# Patient Record
Sex: Male | Born: 1960 | Race: White | Hispanic: No | Marital: Married | Smoking: Former smoker
Health system: Southern US, Community
[De-identification: ages and names within clinical notes are randomized; demographics above are authoritative.]

## PROBLEM LIST (undated history)

## (undated) DIAGNOSIS — E119 Type 2 diabetes mellitus without complications: Secondary | ICD-10-CM

## (undated) DIAGNOSIS — R569 Unspecified convulsions: Secondary | ICD-10-CM

## (undated) DIAGNOSIS — A048 Other specified bacterial intestinal infections: Secondary | ICD-10-CM

## (undated) DIAGNOSIS — I639 Cerebral infarction, unspecified: Secondary | ICD-10-CM

## (undated) DIAGNOSIS — E782 Mixed hyperlipidemia: Secondary | ICD-10-CM

## (undated) DIAGNOSIS — R519 Headache, unspecified: Secondary | ICD-10-CM

## (undated) DIAGNOSIS — K629 Disease of anus and rectum, unspecified: Secondary | ICD-10-CM

## (undated) DIAGNOSIS — K449 Diaphragmatic hernia without obstruction or gangrene: Secondary | ICD-10-CM

## (undated) DIAGNOSIS — I219 Acute myocardial infarction, unspecified: Secondary | ICD-10-CM

## (undated) DIAGNOSIS — I2510 Atherosclerotic heart disease of native coronary artery without angina pectoris: Secondary | ICD-10-CM

## (undated) DIAGNOSIS — G5603 Carpal tunnel syndrome, bilateral upper limbs: Secondary | ICD-10-CM

## (undated) DIAGNOSIS — K219 Gastro-esophageal reflux disease without esophagitis: Secondary | ICD-10-CM

## (undated) DIAGNOSIS — E1142 Type 2 diabetes mellitus with diabetic polyneuropathy: Secondary | ICD-10-CM

## (undated) DIAGNOSIS — K635 Polyp of colon: Secondary | ICD-10-CM

## (undated) HISTORY — DX: Atherosclerotic heart disease of native coronary artery without angina pectoris: I25.10

## (undated) HISTORY — DX: Type 2 diabetes mellitus without complications: E11.9

## (undated) HISTORY — DX: Unspecified convulsions: R56.9

## (undated) HISTORY — PX: OTHER SURGICAL HISTORY: SHX169

## (undated) HISTORY — DX: Acute myocardial infarction, unspecified: I21.9

## (undated) HISTORY — DX: Gastro-esophageal reflux disease without esophagitis: K21.9

## (undated) HISTORY — DX: Polyp of colon: K63.5

## (undated) HISTORY — DX: Mixed hyperlipidemia: E78.2

## (undated) HISTORY — DX: Diaphragmatic hernia without obstruction or gangrene: K44.9

## (undated) HISTORY — DX: Disease of anus and rectum, unspecified: K62.9

## (undated) HISTORY — DX: Cerebral infarction, unspecified: I63.9

## (undated) HISTORY — DX: Other specified bacterial intestinal infections: A04.8

## (undated) HISTORY — PX: CHOLECYSTECTOMY: SHX55

---
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
## 2005-05-24 ENCOUNTER — Emergency Department (HOSPITAL_COMMUNITY): Admission: EM | Admit: 2005-05-24 | Discharge: 2005-05-25 | Payer: Self-pay | Admitting: Emergency Medicine

## 2005-05-30 ENCOUNTER — Ambulatory Visit: Payer: Self-pay | Admitting: *Deleted

## 2005-06-03 ENCOUNTER — Ambulatory Visit: Payer: Self-pay | Admitting: *Deleted

## 2005-06-03 ENCOUNTER — Encounter (HOSPITAL_COMMUNITY): Admission: RE | Admit: 2005-06-03 | Discharge: 2005-07-03 | Payer: Self-pay | Admitting: *Deleted

## 2005-06-06 ENCOUNTER — Ambulatory Visit: Payer: Self-pay | Admitting: *Deleted

## 2006-01-27 DIAGNOSIS — K635 Polyp of colon: Secondary | ICD-10-CM

## 2006-01-27 HISTORY — DX: Polyp of colon: K63.5

## 2006-02-17 ENCOUNTER — Ambulatory Visit: Payer: Self-pay | Admitting: Cardiovascular Disease

## 2006-07-28 ENCOUNTER — Ambulatory Visit: Payer: Self-pay | Admitting: Cardiovascular Disease

## 2006-08-03 ENCOUNTER — Encounter (HOSPITAL_COMMUNITY): Admission: RE | Admit: 2006-08-03 | Discharge: 2006-09-02 | Payer: Self-pay | Admitting: Cardiovascular Disease

## 2006-08-03 ENCOUNTER — Ambulatory Visit: Payer: Self-pay | Admitting: Internal Medicine

## 2006-09-15 ENCOUNTER — Ambulatory Visit: Payer: Self-pay | Admitting: Internal Medicine

## 2006-09-18 ENCOUNTER — Ambulatory Visit (HOSPITAL_COMMUNITY): Admission: RE | Admit: 2006-09-18 | Discharge: 2006-09-18 | Payer: Self-pay | Admitting: Internal Medicine

## 2006-10-12 ENCOUNTER — Ambulatory Visit: Payer: Self-pay | Admitting: Internal Medicine

## 2006-10-12 ENCOUNTER — Encounter: Payer: Self-pay | Admitting: Internal Medicine

## 2006-10-12 ENCOUNTER — Ambulatory Visit (HOSPITAL_COMMUNITY): Admission: RE | Admit: 2006-10-12 | Discharge: 2006-10-12 | Payer: Self-pay | Admitting: Internal Medicine

## 2006-10-12 HISTORY — PX: COLONOSCOPY: SHX174

## 2006-10-12 HISTORY — PX: ESOPHAGOGASTRODUODENOSCOPY: SHX1529

## 2006-10-23 ENCOUNTER — Emergency Department (HOSPITAL_COMMUNITY): Admission: EM | Admit: 2006-10-23 | Discharge: 2006-10-23 | Payer: Self-pay | Admitting: Emergency Medicine

## 2006-10-26 ENCOUNTER — Ambulatory Visit: Payer: Self-pay | Admitting: Internal Medicine

## 2006-11-18 ENCOUNTER — Ambulatory Visit: Payer: Self-pay | Admitting: Internal Medicine

## 2006-12-04 ENCOUNTER — Ambulatory Visit: Payer: Self-pay | Admitting: Cardiovascular Disease

## 2007-07-01 ENCOUNTER — Ambulatory Visit: Payer: Self-pay | Admitting: Cardiovascular Disease

## 2008-06-27 DIAGNOSIS — I219 Acute myocardial infarction, unspecified: Secondary | ICD-10-CM

## 2008-06-27 HISTORY — DX: Acute myocardial infarction, unspecified: I21.9

## 2008-06-30 ENCOUNTER — Ambulatory Visit: Payer: Self-pay | Admitting: Cardiology

## 2008-06-30 ENCOUNTER — Inpatient Hospital Stay (HOSPITAL_COMMUNITY): Admission: EM | Admit: 2008-06-30 | Discharge: 2008-07-05 | Payer: Self-pay | Admitting: Cardiology

## 2008-07-17 ENCOUNTER — Encounter (HOSPITAL_COMMUNITY): Admission: RE | Admit: 2008-07-17 | Discharge: 2008-08-16 | Payer: Self-pay | Admitting: Oncology

## 2008-07-25 DIAGNOSIS — R079 Chest pain, unspecified: Secondary | ICD-10-CM | POA: Insufficient documentation

## 2008-07-25 DIAGNOSIS — M549 Dorsalgia, unspecified: Secondary | ICD-10-CM | POA: Insufficient documentation

## 2008-07-25 DIAGNOSIS — K219 Gastro-esophageal reflux disease without esophagitis: Secondary | ICD-10-CM | POA: Insufficient documentation

## 2008-07-26 ENCOUNTER — Ambulatory Visit: Payer: Self-pay | Admitting: Cardiology

## 2008-07-26 ENCOUNTER — Encounter: Payer: Self-pay | Admitting: Cardiology

## 2008-07-26 DIAGNOSIS — F17200 Nicotine dependence, unspecified, uncomplicated: Secondary | ICD-10-CM | POA: Insufficient documentation

## 2008-07-26 DIAGNOSIS — I2510 Atherosclerotic heart disease of native coronary artery without angina pectoris: Secondary | ICD-10-CM

## 2008-07-26 DIAGNOSIS — E782 Mixed hyperlipidemia: Secondary | ICD-10-CM

## 2008-08-16 ENCOUNTER — Encounter (HOSPITAL_COMMUNITY): Admission: RE | Admit: 2008-08-16 | Discharge: 2008-09-15 | Payer: Self-pay | Admitting: Cardiology

## 2008-09-07 ENCOUNTER — Emergency Department (HOSPITAL_COMMUNITY): Admission: EM | Admit: 2008-09-07 | Discharge: 2008-09-07 | Payer: Self-pay | Admitting: Emergency Medicine

## 2008-10-12 ENCOUNTER — Encounter (INDEPENDENT_AMBULATORY_CARE_PROVIDER_SITE_OTHER): Payer: Self-pay | Admitting: *Deleted

## 2008-10-12 ENCOUNTER — Encounter: Payer: Self-pay | Admitting: Cardiology

## 2008-10-12 LAB — CONVERTED CEMR LAB
ALT: 27 units/L
AST: 19 units/L
Albumin: 4.4 g/dL
Bilirubin, Direct: 0.1 mg/dL
HDL: 32 mg/dL
LDL Cholesterol: 80 mg/dL
Total Protein: 7 g/dL

## 2008-10-13 ENCOUNTER — Encounter: Payer: Self-pay | Admitting: Cardiology

## 2008-10-13 LAB — CONVERTED CEMR LAB
AST: 19 units/L (ref 0–37)
Albumin: 4.4 g/dL (ref 3.5–5.2)
Alkaline Phosphatase: 83 units/L (ref 39–117)
LDL Cholesterol: 80 mg/dL (ref 0–99)
Total Bilirubin: 0.5 mg/dL (ref 0.3–1.2)
Total Protein: 7 g/dL (ref 6.0–8.3)
Triglycerides: 270 mg/dL — ABNORMAL HIGH (ref <150)

## 2008-10-26 ENCOUNTER — Encounter: Payer: Self-pay | Admitting: Cardiology

## 2008-10-30 ENCOUNTER — Ambulatory Visit: Payer: Self-pay | Admitting: Cardiology

## 2008-11-16 ENCOUNTER — Telehealth (INDEPENDENT_AMBULATORY_CARE_PROVIDER_SITE_OTHER): Payer: Self-pay | Admitting: *Deleted

## 2009-01-29 ENCOUNTER — Encounter: Payer: Self-pay | Admitting: Cardiology

## 2009-02-14 ENCOUNTER — Encounter (INDEPENDENT_AMBULATORY_CARE_PROVIDER_SITE_OTHER): Payer: Self-pay | Admitting: *Deleted

## 2009-02-26 ENCOUNTER — Encounter (INDEPENDENT_AMBULATORY_CARE_PROVIDER_SITE_OTHER): Payer: Self-pay | Admitting: *Deleted

## 2009-03-16 ENCOUNTER — Ambulatory Visit: Payer: Self-pay | Admitting: Adult Health

## 2009-03-16 DIAGNOSIS — M62838 Other muscle spasm: Secondary | ICD-10-CM

## 2009-04-24 ENCOUNTER — Emergency Department (HOSPITAL_COMMUNITY): Admission: EM | Admit: 2009-04-24 | Discharge: 2009-04-24 | Payer: Self-pay | Admitting: Emergency Medicine

## 2009-04-24 ENCOUNTER — Encounter (HOSPITAL_COMMUNITY): Admission: RE | Admit: 2009-04-24 | Discharge: 2009-05-24 | Payer: Self-pay | Admitting: Family Medicine

## 2009-04-25 ENCOUNTER — Telehealth (INDEPENDENT_AMBULATORY_CARE_PROVIDER_SITE_OTHER): Payer: Self-pay

## 2009-04-26 ENCOUNTER — Telehealth (INDEPENDENT_AMBULATORY_CARE_PROVIDER_SITE_OTHER): Payer: Self-pay

## 2009-04-26 ENCOUNTER — Encounter: Payer: Self-pay | Admitting: Cardiology

## 2009-04-27 ENCOUNTER — Encounter: Payer: Self-pay | Admitting: Cardiology

## 2009-04-30 ENCOUNTER — Ambulatory Visit (HOSPITAL_COMMUNITY): Admission: RE | Admit: 2009-04-30 | Discharge: 2009-04-30 | Payer: Self-pay | Admitting: General Surgery

## 2009-05-24 ENCOUNTER — Ambulatory Visit: Payer: Self-pay | Admitting: Cardiology

## 2009-05-24 ENCOUNTER — Emergency Department (HOSPITAL_COMMUNITY): Admission: EM | Admit: 2009-05-24 | Discharge: 2009-05-24 | Payer: Self-pay | Admitting: Emergency Medicine

## 2009-05-25 ENCOUNTER — Encounter: Payer: Self-pay | Admitting: Cardiology

## 2009-06-07 ENCOUNTER — Ambulatory Visit: Payer: Self-pay | Admitting: Internal Medicine

## 2009-06-07 DIAGNOSIS — R1012 Left upper quadrant pain: Secondary | ICD-10-CM

## 2009-06-11 ENCOUNTER — Encounter: Payer: Self-pay | Admitting: Gastroenterology

## 2009-06-11 ENCOUNTER — Encounter: Payer: Self-pay | Admitting: Internal Medicine

## 2009-06-12 ENCOUNTER — Ambulatory Visit (HOSPITAL_COMMUNITY): Admission: RE | Admit: 2009-06-12 | Discharge: 2009-06-12 | Payer: Self-pay | Admitting: Internal Medicine

## 2009-06-20 ENCOUNTER — Ambulatory Visit: Payer: Self-pay | Admitting: Cardiology

## 2009-09-27 DIAGNOSIS — A048 Other specified bacterial intestinal infections: Secondary | ICD-10-CM

## 2009-09-27 HISTORY — DX: Other specified bacterial intestinal infections: A04.8

## 2009-10-12 ENCOUNTER — Ambulatory Visit: Payer: Self-pay | Admitting: Internal Medicine

## 2009-10-17 ENCOUNTER — Encounter: Payer: Self-pay | Admitting: Internal Medicine

## 2009-10-26 ENCOUNTER — Encounter (INDEPENDENT_AMBULATORY_CARE_PROVIDER_SITE_OTHER): Payer: Self-pay

## 2009-10-26 ENCOUNTER — Encounter: Payer: Self-pay | Admitting: Cardiology

## 2009-10-26 LAB — CONVERTED CEMR LAB
ALT: 42 units/L
AST: 29 units/L
Albumin: 4.3 g/dL
Alkaline Phosphatase: 99 units/L
HDL: 25 mg/dL
Total Protein: 6.8 g/dL

## 2009-10-28 LAB — CONVERTED CEMR LAB
ALT: 42 units/L (ref 0–53)
AST: 29 units/L (ref 0–37)
Bilirubin, Direct: 0.1 mg/dL (ref 0.0–0.3)
Indirect Bilirubin: 0.5 mg/dL (ref 0.0–0.9)
Total Bilirubin: 0.6 mg/dL (ref 0.3–1.2)
Total Protein: 6.8 g/dL (ref 6.0–8.3)
Triglycerides: 419 mg/dL — ABNORMAL HIGH (ref <150)

## 2009-10-30 ENCOUNTER — Ambulatory Visit: Payer: Self-pay | Admitting: Cardiology

## 2010-02-11 LAB — CONVERTED CEMR LAB
ALT: 26 units/L (ref 0–53)
AST: 17 units/L (ref 0–37)
Alkaline Phosphatase: 97 units/L (ref 39–117)
Cholesterol: 284 mg/dL — ABNORMAL HIGH (ref 0–200)
Total Protein: 7.2 g/dL (ref 6.0–8.3)
Triglycerides: 549 mg/dL — ABNORMAL HIGH (ref <150)

## 2010-02-12 ENCOUNTER — Encounter (HOSPITAL_COMMUNITY)
Admission: RE | Admit: 2010-02-12 | Discharge: 2010-02-26 | Payer: Self-pay | Source: Home / Self Care | Attending: Pulmonary Disease | Admitting: Pulmonary Disease

## 2010-02-26 NOTE — Letter (Signed)
Summary: [facility], [facility] at [employer]  [address] [place] [address]   Phone: [phone]  Fax: [phone]     October 30, 2009 MRN: [id number]   Nicholas [name] [address] [place], [place]  [address]      YOUR LAB WORK IS DUE  January 30, 2010 _________________________________________  Please go to [facility], located across the street from [facility] on the second floor.  Hours are Monday - Friday 7am until 7:30pm         Saturday 8am until 12noon    _X_  DO NOT EAT OR DRINK AFTER MIDNIGHT EVENING PRIOR TO LABWORK  __ YOUR LABWORK IS NOT FASTING --YOU MAY EAT PRIOR TO LABWORK

## 2010-02-26 NOTE — Letter (Signed)
Summary: LABS FROM [facility]  LABS FROM [facility]   Imported By: Rexene Alberts 10/17/2009 16:33:25  _____________________________________________________________________  External Attachment:    Type:   Image     Comment:   External Document

## 2010-02-26 NOTE — Letter (Signed)
Summary: External Other  External Other   Imported By: Peggyann Shoals 06/11/2009 11:14:28  _____________________________________________________________________  External Attachment:    Type:   Image     Comment:   External Document

## 2010-02-26 NOTE — Assessment & Plan Note (Signed)
Summary: ABD PAIN/SS   Visit Type:  Initial Visit Primary Care Provider:  golding  Chief Complaint:  epigastric pain and recently treated for hpylori.  History of Present Illness: Followup long-standing atypical chest and abdominal pain with negative extensive workup including EGD in 07/03/2006. He's tried Lidoderm patches in the past without any appreciable improvement in his symptoms. He saw Dr.Golding recently who gave him a course of Helidac therapy for positive H. pylori serologies. He tells me he started having normal bowel movements with antibiotic therapy (completed one week ago). Between normalization of bowel function and anti-H. pyloric therapy his abdominal pain has improved considerably. He continues taking protonix and Plavix. It is notably also had a negative colonoscopy back in 07-03-06. He's also had negative CT scan and cardiac evaluation.  As far as bowel function is concerned. He's gone from having one bowel movement once to twice weekly to one to 2 bowel movements every day since antibiotic therapy was taken. He states he really feels better after he is has good, regular bowel movements. He did not keep his June 2011 appointment with [place].  Current Medications (verified): 1)  Protonix 40 Mg Solr (Pantoprazole Sodium) .Marland Kitchen.. 1 Tab Once Daily 2)  Plavix 75 Mg Tabs (Clopidogrel Bisulfate) .Marland Kitchen.. 1 Tab Once Daily 3)  Aspirin Ec 325 Mg Tbec (Aspirin) .... Take One Tablet By Mouth Daily 4)  Metoprolol Tartrate 25 Mg Tabs (Metoprolol Tartrate) .... Take 1 Tablet By Mouth Once A Day  Allergies (verified): No Known Drug Allergies  Past History:  Family History: Last updated: 2009-07-02 Father: deceased age 9 due to myocardial infarction Mother: deceased age 62 due to renal failure Brother, PUD Brother, deceased, cancer started in "knee"  3 brother, 1 with stents  3 sister, alive and well No FH of CRC, liver disease.  Social History: Last updated: 02-Jul-2009 Full Time,  self-employed Married  Tobacco Use - Yes, 1ppd Alcohol Use - no Regular Exercise - no Drug Use - no pt has 2 children alive and well      Risk Factors: Alcohol Use: 0 (03/16/2009) Exercise: no (07/25/2008)  Risk Factors: Smoking Status: current (03/16/2009)  Past Medical History: CAD - DES to PLB, BMS to mid and distal RCA, DES to circumflex and distal LAD, 6/10 Diabetes Type 2 Hyperlipidemia Myocardial Infarction G E R D TCS 9/08-->Hyperplastic rectosigmoid polyp EGD 9/08-->small hiatal hernia, bulbar erosions hpylori  Past Surgical History: Collapsed lung 15 yrs ago, chest tube Wire in apex of right lung, since childhood, found in there incidentally gallbladder  Vital Signs:  Patient profile:   50 year old male Height:      68 inches Weight:      182 pounds BMI:     27.77 Temp:     98.2 degrees F oral Pulse rate:   92 / minute BP sitting:   112 / 82  (left arm) Cuff size:   regular  Vitals Entered By: Hendricks Limes LPN (October 12, 2009 2:20 PM)  Impression & Recommendations: Impression: Relatively long-standing atypical left upper quadrant abdominal and left chest pain now improved at least temporarily with a course of therapy for positive H. pylori serologies and improvement  in bowel function.  All along, we have not found any significant organic pathology to account for his chest and abdominal pain. EGD back in 03-Jul-2006 essentially negative. Am doubtful an occult H. pylori infection explains his chest and abdominal pain satisfactorily. However, wholeheartedly agree with the treatment given there was some objective evidence  of infection. In retrospect, I wonder if his symptoms have not been more related to constipation rather than anything else along the way. At any rate, I'm glad to see the patient is doing much better now. The likelihood of any significant occult underlying pathology lurking  under the surface is pretty low at this time.  Recommendations: Continue  Protonix 40 mg orally daily particularly while on Plavix. A bowel regimen consisting primarily with MiraLax. I've instructed Mr. Whiteford to take 17 g orally at bedtime as needed if no bowel movement on any given day to facilitate a bowel movement daily to every other day (if recent improvement in bowel function is related to a course of antibiotic therapy alone, improvement in bowel will likely only be short-lived and therefore laxative supplement will likely be needed).  I plan to see this nice gentleman back in the office in 6 weeks to reassess.  Appended Document: Orders Update    Clinical Lists Changes  Orders: Added new Service order of Est. Patient Level IV (04540) - Signed

## 2010-02-26 NOTE — Letter (Signed)
Summary: Office Note from Dr.Jenkins re:Lap Choley  Office Note from Dr.Jenkins re:Lap Choley   Imported By: Raechel Ache [facility] 04/26/2009 13:47:55  _____________________________________________________________________  External Attachment:    Type:   Image     Comment:   External Document

## 2010-02-26 NOTE — Assessment & Plan Note (Signed)
Summary: nurse visit  Nurse Visit   Vital Signs:  Patient profile:   50 year old male Weight:      199 pounds BMI:     30.37 O2 Sat:      97 % on Room air Pulse rate:   87 / minute BP sitting:   121 / 84  (left arm)  Vitals Entered By: Teressa Lower RN (May 24, 2009 9:24 AM)  O2 Flow:  Room air  Serial Vital Signs/Assessments:  Time      Position  BP       Pulse  Resp  Temp     By 9:43 AM   Sitting   121/80   80                    Nahun Kronberg RN   Current Medications (verified): 1)  Protonix 40 Mg Solr (Pantoprazole Sodium) .Marland Kitchen.. 1 Tab Once Daily 2)  Plavix 75 Mg Tabs (Clopidogrel Bisulfate) .Marland Kitchen.. 1 Tab Once Daily 3)  Glipizide 5 Mg Xr24h-Tab (Glipizide) .Marland Kitchen.. 1 Tab Once Daily 4)  Actos 30 Mg Tabs (Pioglitazone Hcl) .Marland Kitchen.. 1 Tab Once Daily 5)  Aspirin Ec 325 Mg Tbec (Aspirin) .... Take One Tablet By Mouth Daily 6)  Metoprolol Tartrate 25 Mg Tabs (Metoprolol Tartrate) .... Take 1 Tablet By Mouth Once A Day  Allergies (verified): No Known Drug Allergies  Primary Provider:  Assunta Found, MD   History of Present Illness: S:pt walked into office c/o numbness in left chest and side B: pt had cholecystectomy 3 weeks ago A: ekg- bbb, vss, pt has ocass numbness in hands also, completely numb in left chest and side, this  has been like this for about 2 yrs with numerous tests and procedures with no relief R I suggested pt go to the ED for eval, he refused.: pt to see Dr. Jena Gauss  per Dr. Phillips Odor and I will call Dr. Beatrice Lecher  nurse for a neurology referrall, A:  pt called back to the office with worsening cp and left arm pain, sent pt to ED, called  to ED gave report and faxed EKg done this am   Patient Instructions: 1)  Your physician recommends that you continue on your current medications as directed. Please refer to the Current Medication list given to you today. 2)  I will speak with Dr. Beatrice Lecher nurse today about a neurology referral

## 2010-02-26 NOTE — Progress Notes (Signed)
Summary: Surg. Clearance   Phone Note From Other Clinic Call back at [phone]   Caller: Jan (Dr.Jenkins' office) Summary of Call: needs surg. clearance for Lap Choley/tg Initial call taken by: Raechel Ache [facility],  April 26, 2009 11:18 AM  Follow-up for Phone Call        Jan, from Dr. York Ram office was asked to send [place] a  copy of Mr. Wickard's office visit (scanned into chart) concerning surgery. They have advised pt. to stop Plavix 3 days prior to surgery. Is this ok with you? Pt. states surgery is scheduled for this coming Monday 04-30-09. He was last seen here by Gladis Riffle, NP on 03-16-09. Follow-up by: Larita Fife Via LPN,  April 26, 2009 1:20 PM  Additional Follow-up for Phone Call Additional follow up Details #1::        I reviewed the surgical visit note and last office note by [name].  Patient less than one year out from DES (June 2010), but has had greater than 6 months therapy, and stent thrombosis risk while present should still be relatively small.  If cholecystectomy is felt to be needed soon, would suggest that Plavix not be held greater than 7 days total if possible.  He should be able to proceed with surgery as long as cardiac status has been stable.  Can be seen in-house as consult if needed. Additional Follow-up by: Loreli Slot, MD, [facility],  April 27, 2009 11:56 AM    Additional Follow-up for Phone Call Additional follow up Details #2::    Clearance letter faxed to Dr. Juanetta Gosling office.

## 2010-02-26 NOTE — Letter (Signed)
Summary: [facility], [facility] at [employer]  [address] [place] [address]   Phone: [phone]  Fax: [phone]     Jun 20, 2009 MRN: [id number]   Nicholas [name] [address] [place], [place]  [address]      YOUR LAB WORK IS DUE  December 03, 2009 _________________________________________  Please go to [facility], located across the street from [facility] on the second floor.  Hours are Monday - Friday 7am until 7:30pm         Saturday 8am until 12noon    _X_  DO NOT EAT OR DRINK AFTER MIDNIGHT EVENING PRIOR TO LABWORK  __ YOUR LABWORK IS NOT FASTING --YOU MAY EAT PRIOR TO LABWORK

## 2010-02-26 NOTE — Letter (Signed)
Summary: Clearance Letter  [facility]  [address] [place] [address]   Phone: [phone]  Fax: [phone]    April 27, 2009  Re:     Nicholas [name] Address:   [address]     [place], [place]  [address] DOB:     03-18-1960 MRN:     [id number]   Dear Dr. Lovell Sheehan,   Patient less than one year out from DES (June 2010), but has had greater than 6 months therapy, and stent thrombosis risk while present should still be relatively small.  If cholecystectomy is felt to be needed soon, would suggest that Plavix not be held greater than 7 days total if possible.  He should be able to proceed with surgery as long as cardiac status has been stable.  Can be seen in-house as consult if needed.    Sincerely,  Diona Browner, MD, [facility], [place]    Sincerely,  Larita Fife Via LPN

## 2010-02-26 NOTE — Assessment & Plan Note (Signed)
Summary: SEVERE LEFT SIDE PAIN/SS   Visit Type:  Initial Consult Referring Provider:  McGough Primary Care Provider:  Phillips Odor  Chief Complaint:  left side pain.  History of Present Illness: [name] is a pleasant 50 y/o WM, patient of Dr. Phillips Odor, who was referred for further evaluation of chronic left chest/left upper quadrant abd pain. He has had these symptoms for 2-3 years. We initially saw him in 8/08 for similar symptoms. At that time, he had chest CT which showed wire in apex of right lung (there since childhood per pt) and minimal interstitial lung disease. Labs including LFTs, amylase, lipase all normal. CT A/P showed fatty liver. TCS/EGD 9/08 showed small hh, bulbar erosions, rs hyperplastic polyp. Dr. Jena Gauss felt his symptoms were coming from chest wall.    Since then he has had CAD with 5 stents in 6/10 when he presented with non-ST-elevation MI. He states this helped his back pain but did not affect chest/luq pain. He had recent chest CT which showed probable increased basilar atelectasis status post recent cholecystectomy. No other acute chest findings. Nonspecific small fluid and air collection in the cholecystectomy bed status post apparent recent cholecystectomy.Hepatic steatosis.  Gallbladder removed for biliary dyskinesia on 4/4/11did not affect luq pain/chest pain.  He describes pain as numb and hurting all at same time. Starts at xiphoid process and follows left lower ribcage margin and goes into flank. Sometimes just under left ribs. No associated n/v. Not affected by meals or BMs. Has BM q2-3 days, soft stool, since gb out. Prior to that had stool every other day. No melena, brbpr. Some days has no pain. Days with pain, the pain is constant. No associated SOB. No cough/congestion. Apply pressure helps pain. No better with flexeril or levsin. Not worse with exertion. No back pain since stents placed.           Current Medications (verified): 1)  Protonix 40 Mg Solr  (Pantoprazole Sodium) .Marland Kitchen.. 1 Tab Once Daily 2)  Plavix 75 Mg Tabs (Clopidogrel Bisulfate) .Marland Kitchen.. 1 Tab Once Daily 3)  Actos 30 Mg Tabs (Pioglitazone Hcl) .Marland Kitchen.. 1 Tab Once Daily 4)  Aspirin Ec 325 Mg Tbec (Aspirin) .... Take One Tablet By Mouth Daily 5)  Metoprolol Tartrate 25 Mg Tabs (Metoprolol Tartrate) .... Take 1 Tablet By Mouth Once A Day 6)  Glimepiride 2 Mg Tabs (Glimepiride) .... 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  Social History: Full Time, self-employed Married  Tobacco Use - Yes, 1ppd Alcohol Use - no Regular Exercise - no Drug Use - no pt has 2 children alive and well      Review of Systems General:  Denies fever, chills, sweats, anorexia, fatigue, weakness, and weight loss. Eyes:  Denies vision loss. ENT:  Denies nasal congestion, nosebleeds, sore throat, hoarseness, and difficulty swallowing.  CV:  Complains of chest pains; denies angina, palpitations, dyspnea on exertion, and peripheral edema. Resp:  Denies dyspnea at rest, dyspnea with exercise, cough, sputum, and wheezing. GI:  See HPI; Denies difficulty swallowing. GU:  Denies urinary burning and blood in urine. MS:  Denies joint pain / LOM. Derm:  Denies rash and itching. Neuro:  Denies weakness, frequent headaches,  memory loss, and confusion. Psych:  Denies depression and anxiety. Endo:  Denies unusual weight change. Heme:  Denies bruising and bleeding. Allergy:  Denies hives and rash.  Vital Signs:  Patient profile:   50 year old male Height:      68 inches Weight:      189 pounds BMI:     28.84 Temp:     97.7 degrees F oral Pulse rate:   80 / minute BP sitting:   118 / 80  (left arm) Cuff size:   regular  Vitals Entered By: Cloria Spring LPN (Jun 07, 2009 1:32 PM)  Physical Exam  General:  Well developed, well nourished, no acute distress. Head:  Normocephalic and atraumatic. Eyes:  Conjunctivae pink, no scleral icterus.  Mouth:  Oropharyngeal mucosa moist, pink.  No lesions, erythema or exudate.    Neck:  Supple; no masses or thyromegaly. Lungs:  Clear throughout to auscultation. Heart:  Regular rate and rhythm; no murmurs, rubs,  or bruits. Abdomen:  Bowel sounds normal.  Abdomen is soft, nontender, nondistended.  No rebound or guarding.  No hepatosplenomegaly, masses or hernias.  No abdominal bruits. Could not ilicit pain. Extremities:  No clubbing, cyanosis, edema or deformities noted. Neurologic:  Alert and  oriented x4;  grossly normal neurologically. Skin:  Intact without significant lesions or rashes. Cervical Nodes:  No significant cervical adenopathy. Psych:  Alert and cooperative. Normal mood and affect.  Impression & Recommendations:  Problem # 1:  LUQ PAIN (ICD-789.02)  Left sided chest pain/LUQ pain. Really seems to be more associated with ribcage/chest wall. Not associated with meals, BMs. He had extensive w/u in 2008 by [place] with no significant findings. Since has tried antispasmotics. Cholecystectomy and coronary artery stents have not helped. Sometimes atypcial reflux can present this way, but notably he has good control of typical symptoms on pantoprazole. I discussed this patient with Dr. Jena Gauss. He recommends one last CT A/P with IV/oral contrast. If unremarkable, then  consider Lidoderm patch, off label.   Orders: Consultation Level IV (91478)  Problem # 2:  TOBACCO ABUSE (ICD-305.1) Encourage smoking cessation. He is not interested at this time, stating he has failed multiple times. Orders: Consultation Level IV (29562)  Appended Document: SEVERE LEFT SIDE PAIN/SS Please arrange for CT A/P with IV/oral contrast. Recommended by Dr. Jena Gauss. Please let patient know.   Appended Document: SEVERE LEFT SIDE PAIN/SS Pt aware CT to be scheduled.  Appended Document: SEVERE LEFT SIDE PAIN/SS Pt scheduled for CT Scan 06/12/09@4 :00p.m.

## 2010-02-26 NOTE — Miscellaneous (Signed)
Summary: LABS LIPIDS,LIVER,10/12/2008  Clinical Lists Changes  Observations: Added new observation of ALBUMIN: 4.4 g/dL (04/54/0981 19:14) Added new observation of PROTEIN, TOT: 7.0 g/dL (78/29/5621 30:86) Added new observation of SGPT (ALT): 27 units/L (10/12/2008 15:01) Added new observation of SGOT (AST): 19 units/L (10/12/2008 15:01) Added new observation of ALK PHOS: 83 units/L (10/12/2008 15:01) Added new observation of BILI DIRECT: 0.1 mg/dL (57/84/6962 95:28) Added new observation of LDL: 80 mg/dL (41/32/4401 02:72) Added new observation of HDL: 32 mg/dL (53/66/4403 47:42) Added new observation of TRIGLYC TOT: 270 mg/dL (59/56/3875 64:33) Added new observation of CHOLESTEROL: 166 mg/dL (29/51/8841 66:06)

## 2010-02-26 NOTE — Letter (Signed)
Summary: CT SCAN ORDER  CT SCAN ORDER   Imported By: Ave Filter 06/11/2009 12:40:10  _____________________________________________________________________  External Attachment:    Type:   Image     Comment:   External Document

## 2010-02-26 NOTE — Assessment & Plan Note (Signed)
Summary: rov  Medications Added FLEXERIL 10 MG TABS (CYCLOBENZAPRINE HCL) take 1 tablet by mouth three times a day as needed for muscle spasm      Allergies Added: NKDA  Visit Type:  Follow-up Primary Provider:  Assunta Found, MD  CC:  NO COMPLAINTS.  History of Present Illness: Nicholas Nicholas [name] is a pleasant 50 y/o CM with known history of CAD, s/p DES to PLB, BMS to mid and distal RCA, DES to circumflex and distal LAD in June of 2010. This was completed as a result of complaints of chest pressure on the left side.  Since having the PCI he has not felt any better symptomatically. He continues to have pressure in his chest and substernal area, with and without exertion. Sometimes becoming more intense and then lessening on its own.  He has not ever taken Ntg for relief.  He has had GB ultrasound, colonoscopy, EGD all of which were WNL per patient.,  He continues to smoke, but is down to 4 cigarrettes a day. He has been medically compliant.  He states that it seems that something is not being found, as he has not had improvement in his symptoms since cardiac intervention.  Dr. Phillips Odor suggested muscle relaxers in the past, but he did not want to use this until other work-ups were completed.  Preventive Screening-Counseling & Management  Alcohol-Tobacco     Alcohol drinks/day: 0     Smoking Status: current     Smoke Cessation Stage: ready  Current Medications (verified): 1)  Protonix 40 Mg Solr (Pantoprazole Sodium) .Marland Kitchen.. 1 Tab Once Daily 2)  Plavix 75 Mg Tabs (Clopidogrel Bisulfate) .Marland Kitchen.. 1 Tab Once Daily 3)  Glipizide 5 Mg Xr24h-Tab (Glipizide) .Marland Kitchen.. 1 Tab Once Daily 4)  Actos 30 Mg Tabs (Pioglitazone Hcl) .Marland Kitchen.. 1 Tab Once Daily 5)  Aspirin 81 Mg Tbec (Aspirin) .Marland Kitchen.. 1 Tab Once Daily 6)  Metoprolol Tartrate 25 Mg Tabs (Metoprolol Tartrate) .Marland Kitchen.. 1 Tab Two Times A Day 7)  Flexeril 10 Mg Tabs (Cyclobenzaprine Hcl) .... Take 1 Tablet By Mouth Three Times A Day As Needed For Muscle Spasm  Allergies  (verified): No Known Drug Allergies PMH-FH-SH reviewed-no changes except otherwise noted  Social History: Alcohol drinks/day:  0  Review of Systems       Chest pressure. All other systems have been reviewed and are negative unless stated above.   Vital Signs:  Patient profile:   50 year old Nicholas [name] Weight:      199 pounds Pulse rate:   81 / minute BP sitting:   119 / 76  (right arm)  Vitals Entered By: Dreama Saa, CNA (March 16, 2009 2:53 PM)  Physical Exam  General:  Well developed, well nourished, in no acute distress. Lungs:  Clear bilaterally to auscultation and percussion. Heart:  Non-displaced PMI, chest non-tender; regular rate and rhythm, S1, S2 without murmurs, rubs or gallops. Carotid upstroke normal, no bruit. Normal abdominal aortic size, no bruits. Femorals normal pulses, no bruits. Pedals normal pulses. No edema, no varicosities. Abdomen:  Negative murphy's sign. Msk:  Pain is reproducible with palpation of L chest and substernal area. Extremities:  No clubbing or cyanosis. Neurologic:  Alert and oriented x 3. Psych:  Normal affect.   EKG  Procedure date:  03/16/2009  Findings:      Normal sinus rhythm with rate of: 78bpm  Right bundle branch block.    Impression & Recommendations:  Problem # 1:  MUSCLE SPASM (ICD-728.85) This does not appear to  be cardiac in origin as he symptoms did not subside after PCI.  The pain is constant with increases of pain waxing and waneing.  Will give Rx for flexeril without refills.  If this helps, he will follow-up with Dr. Phillips Odor for further evalution and refils at his discretion.    Problem # 2:  CHEST PAIN (ICD-786.50) Other etioogy could be biliary dysfunction. HIDA scan may be considered if musculoskeletal is ruled out.  His updated medication list for this problem includes:    Plavix 75 Mg Tabs (Clopidogrel bisulfate) .Marland Kitchen... 1 tab once daily    Aspirin 81 Mg Tbec (Aspirin) .Marland Kitchen... 1 tab once daily    Metoprolol  Tartrate 25 Mg Tabs (Metoprolol tartrate) .Marland Kitchen... 1 tab two times a day  Problem # 3:  TOBACCO ABUSE (ICD-305.1) Encouragement to quit.  Problem # 4:  CORONARY ATHEROSCLEROSIS NATIVE CORONARY ARTERY (ICD-414.01) Continue dual antiplatlet tx. Consider coronary spasm if all else is ruled out, and change to CCB at a later date at Dr. Liliane Channel discretion, His updated medication list for this problem includes:    Plavix 75 Mg Tabs (Clopidogrel bisulfate) .Marland Kitchen... 1 tab once daily    Aspirin 81 Mg Tbec (Aspirin) .Marland Kitchen... 1 tab once daily    Metoprolol Tartrate 25 Mg Tabs (Metoprolol tartrate) .Marland Kitchen... 1 tab two times a day  Patient Instructions: 1)  Your physician recommends that you schedule a follow-up appointment in: 6 months  2)  Your physician has recommended you make the following change in your medication: start taking Flexeril 10mg  by mouth three times a day as needed for muscle spasms. Please follow up with your Primary care doctor (Dr.Golding) for musle spasms and medication to treat this condition. Prescriptions: FLEXERIL 10 MG TABS (CYCLOBENZAPRINE HCL) take 1 tablet by mouth three times a day as needed for muscle spasm  #60 x 0   Entered by:   Larita Fife Via LPN   Authorized by:   Joni Reining, NP   Signed by:   Larita Fife Via LPN on 16/10/9602   Method used:   Electronically to        [facility] (retail)       [address]       [place], [place]  [address]       Ph: [phone]       Fax: [phone]   RxID:   [id number]

## 2010-02-26 NOTE — Miscellaneous (Signed)
Summary: Lipids and LFT's  Clinical Lists Changes  Observations: Added new observation of ALBUMIN: 4.3 g/dL (04/54/0981 19:14) Added new observation of PROTEIN, TOT: 6.8 g/dL (78/29/5621 30:86) Added new observation of SGPT (ALT): 42 units/L (10/26/2009 15:16) Added new observation of SGOT (AST): 29 units/L (10/26/2009 15:16) Added new observation of ALK PHOS: 99 units/L (10/26/2009 15:16) Added new observation of BILI DIRECT: 0.6 mg/dL (57/84/6962 95:28) Added new observation of LDL: not calc mg/dL (41/32/4401 02:72) Added new observation of HDL: 25 mg/dL (53/66/4403 47:42) Added new observation of TRIGLYC TOT: 419 mg/dL (59/56/3875 64:33) Added new observation of CHOLESTEROL: 254 mg/dL (29/51/8841 66:06)

## 2010-02-26 NOTE — Assessment & Plan Note (Signed)
Summary: ROV  Medications Added NITROSTAT 0.4 MG SUBL (NITROGLYCERIN) take as directed for chest pain SIMVASTATIN 20 MG TABS (SIMVASTATIN) take 1 tablet by mouth at bedtime      Allergies Added: NKDA  Visit Type:  Follow-up Primary Provider:  Dr. Assunta Found   History of Present Illness: 50 year old male presentss for followup. I last saw him back in May. He continues to follow with Dr. Jena Gauss for GI evaluation. She reports occasional chest pain, fairly atypical, not specifically with exertion, and sporadic overall. He has not required nitroglycerin.  Followup labs from 30 September showed cholesterol 254, triglycerides 419, HDL 25, AST 29, ALT 42. We discussed these today. Diet measures were discussed as well as initiating statin therapy for further risk reduction.  He reports compliance with his medications.  We discussed smoking cessation. He states he has been trying to cut back. I spoke with him about the [employer] and also trying to work toward a quit date.  Clinical Review Panels:  Cardiac Imaging Cardiac Cath Findings  CONCLUSION:   1. Coronary artery disease status post recent non-ST-elevation       myocardial infarction with 50% stenosis in LAD, 90% stenosis of       distal tip of the LAD, 90% stenosis of the proximal circumflex       artery with total occlusion of the mid circumflex artery, 90% mid       and 95% distal stenosis in the right coronary artery and 90%       stenosis in the posterolateral branch of the right coronary artery       with mid inferior wall hypokinesis and an estimated ejection       fraction of 60%.   2. Successful PCI of the lesion in the large posterolateral branch of       the circumflex artery with a drug-eluting stent with improvement in       center narrowing from 90% to 0%.   3. Successful treatment of tandem lesions in the mid and distal right       coronary artery with non-overlapping bare-metal stent with       improvement in mid  stenosis from 90% to 0%, improvement in distal       stenosis from 95% to 0%.      DISPOSITION:  The patient returned to post angio room for further   observation.  We will plan intervention on the circumflex artery   tomorrow and I will discuss this with Dr. Excell Seltzer.               [name], MD, [facility]   Electronically Signed            BRB/MEDQ  D:  07/03/2008  T:  07/04/2008  Job:  [id number]     (07/04/2008)    Current Medications (verified): 1)  Protonix 40 Mg Solr (Pantoprazole Sodium) .Marland Kitchen.. 1 Tab Once Daily 2)  Plavix 75 Mg Tabs (Clopidogrel Bisulfate) .Marland Kitchen.. 1 Tab Once Daily 3)  Aspirin Ec 325 Mg Tbec (Aspirin) .... Take One Tablet By Mouth Daily 4)  Metoprolol Tartrate 25 Mg Tabs (Metoprolol Tartrate) .... Take 1 Tablet By Mouth Once A Day 5)  Nitrostat 0.4 Mg Subl (Nitroglycerin) .... Take As Directed For Chest Pain 6)  Simvastatin 20 Mg Tabs (Simvastatin) .... 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         Has sporadic, atypical abdominal discomfort, evaluated over time by  Dr. Jena Gauss, recently treated for Helicobacter pylori. Otherwise reviewed and negative.  Vital Signs:  Patient profile:   50 year old male Weight:      180 pounds BMI:     27.47 Pulse rate:   93 / minute BP sitting:   106 / 65  (right arm)  Vitals Entered By: Dreama Saa, CNA (October 30, 2009 1:47 PM)  Physical Exam  Additional Exam:  Comfortable in no acute distress. HEENT: Conjuctivae and lids normal, oropharynx clear with moist mucosa. Neck: Supple, no elevated JVP, no loud carotid bruits, no thyromegaly or tenderness. Lungs: Nonlabored breathing at rest. CTA without rales or wheezes. Cor: PMI nondisplaced. RRR, normal S1/S2. No pathologic systolic murmurs. No S3 or rub. Abd: Soft, NTND.  No HSM. No bruits. Normoactive bowel sounds. Skin: Warm and dry. Musculoskeletal: No kyphosis. Extremities: No pitting edema. Neuropsychiatric: Alert and oriented x3, affect appropriate.    EKG  Procedure date:  10/30/2009  Findings:      Sinus rhythm at 89 beats per minute, incomplete right bundle branch block.  Impression & Recommendations:  Problem # 1:  CORONARY ATHEROSCLEROSIS NATIVE CORONARY ARTERY (ICD-414.01)  Symptomatically stable on medical therapy. Discussed continued symptom observation. Followup in 6 months.  His updated medication list for this problem includes:    Plavix 75 Mg Tabs (Clopidogrel bisulfate) .Marland Kitchen... 1 tab once daily    Aspirin Ec 325 Mg Tbec (Aspirin) .Marland Kitchen... Take one tablet by mouth daily    Metoprolol Tartrate 25 Mg Tabs (Metoprolol tartrate) .Marland Kitchen... Take 1 tablet by mouth once a day    Nitrostat 0.4 Mg Subl (Nitroglycerin) .Marland Kitchen... Take as directed for chest pain  Problem # 2:  HYPERLIPIDEMIA (ICD-272.4)  Initiate simvastatin 20 mg p.o. q.h.s. Followup fasting lipid profile and liver function tests in 12 weeks.  His updated medication list for this problem includes:    Simvastatin 20 Mg Tabs (Simvastatin) .Marland Kitchen... Take 1 tablet by mouth at bedtime  Future  Orders: T-Lipid Profile ([id number]) ... 01/30/2010 T-Hepatic Function [id number]) ... 01/30/2010  Problem # 3:  TOBACCO ABUSE (ICD-305.1)  Smoking cessation discussed again today. Gave the [employer] number and counseling.  Patient Instructions: 1)  Your physician recommends that you schedule a follow-up appointment in: 6 months 2)  Your physician recommends that you return for lab work in: 3 months 3)  Your physician has recommended you make the following change in your medication: Start taking Simvastatin 20mg  by mouth at bedtime  4)  Your physician discussed the hazards of tobacco use.  Tobacco use cessation is recommended and techniques and options to help you quit were discussed. Please see handout for the [facility]  Line Prescriptions: SIMVASTATIN 20 MG TABS (SIMVASTATIN) take 1 tablet by mouth at bedtime  #30 x 6   Entered by:   Larita Fife Via LPN   Authorized by:   Loreli Slot, MD, [facility]   Signed by:   Larita Fife Via LPN on 04/54/0981   Method used:   Electronically to        [facility] (retail)       [address] [place], [place]  [address]       Ph: [phone]       Fax: [phone]   RxID:   [id number]

## 2010-02-26 NOTE — Progress Notes (Signed)
Summary: Surg. Clearance   Phone Note Call from Patient   Caller: Patient Summary of Call: pt states that he is needing surg. clearance for Galbladder surgery/pls call patient for details/tg Initial call taken by: Raechel Ache [facility] ([facility]),  April 25, 2009 2:51 PM  Follow-up for Phone Call        Pt. advised to have the office of the MD who is doing surgery to contact this office. Pt. will be seeing MD for the first time this morning. Follow-up by: Larita Fife Via LPN,  April 26, 2009 9:18 AM

## 2010-02-26 NOTE — Miscellaneous (Signed)
Summary: protonix refill  Clinical Lists Changes  Medications: Rx of PROTONIX 40 MG SOLR (PANTOPRAZOLE SODIUM) 1 tab once daily;  #30 x 3;  Signed;  Entered by: Teressa Lower RN;  Authorized by: Loreli Slot, MD, [facility];  Method used: Electronically to [facility]*, [address] [place], [place], [place]  [address], Ph: [phone], Fax: [phone]    Prescriptions: PROTONIX 40 MG SOLR (PANTOPRAZOLE SODIUM) 1 tab once daily  #30 x 3   Entered by:   Teressa Lower RN   Authorized by:   Loreli Slot, MD, [facility]   Signed by:   Teressa Lower RN on 02/14/2009   Method used:   Electronically to        [facility] (retail)       [address]       [place], [place]  [address]       Ph: [phone]       Fax: [phone]   RxID:   [id number]

## 2010-02-26 NOTE — Miscellaneous (Signed)
  Clinical Lists Changes  Medications: Rx of PLAVIX 75 MG TABS (CLOPIDOGREL BISULFATE) 1 tab once daily;  #30 x 6;  Signed;  Entered by: Teressa Lower RN;  Authorized by: Loreli Slot, MD, [facility];  Method used: Electronically to [facility]*, [address], [place], [place], [place]  [address], Ph: [phone], Fax: [phone]    Prescriptions: PLAVIX 75 MG TABS (CLOPIDOGREL BISULFATE) 1 tab once daily  #30 x 6   Entered by:   Teressa Lower RN   Authorized by:   Loreli Slot, MD, [facility]   Signed by:   Teressa Lower RN on 01/29/2009   Method used:   Electronically to        [facility] (retail)       [address]       [place], [place]  [address]       Ph: [phone]       Fax: [phone]   RxID:   [id number]

## 2010-02-26 NOTE — Assessment & Plan Note (Signed)
Summary: due for 6 mth f/u per pt phone call/tg      Allergies Added: NKDA  Visit Type:  Follow-up Primary Provider:  Dr. Assunta Found   History of Present Illness: 50 year old male presents for followup. He was last seen by Nicholas [name] back in February. He has had problems with chronic, predominantly left-sided, chest and abdominal upper quadrant discomfort. He is status post cholecystectomy, and has undergone additional gastrointestinal evaluation under the direction of Nicholas [name]. Cardiovascular disease history as described below.  He tells me that he has a pending referral to [name] for further assessment. He describes an intermittent "numbness" in the left side of his chest and upper abdominal flank. This is not precipitated by anything in particular. He states it is a similar feeling to "when your hand goes to sleep."  He reports compliance with his medications which are outlined below most of the time. He admits that he does forget some medicines occasionally.  He is not reporting any reproducible exertional chest pain to suggest angina.  Clinical Review Panels:  Cardiac Imaging Cardiac Cath Findings  CONCLUSION:   1. Coronary artery disease status post recent non-ST-elevation       myocardial infarction with 50% stenosis in LAD, 90% stenosis of       distal tip of the LAD, 90% stenosis of the proximal circumflex       artery with total occlusion of the mid circumflex artery, 90% mid       and 95% distal stenosis in the right coronary artery and 90%       stenosis in the posterolateral branch of the right coronary artery       with mid inferior wall hypokinesis and an estimated ejection       fraction of 60%.   2. Successful PCI of the lesion in the large posterolateral branch of       the circumflex artery with a drug-eluting stent with improvement in       center narrowing from 90% to 0%.   3. Successful treatment of tandem lesions in the mid and distal right       coronary artery with non-overlapping bare-metal stent with       improvement in mid stenosis from 90% to 0%, improvement in distal       stenosis from 95% to 0%.      DISPOSITION:  The patient returned to post angio room for further   observation.  We will plan intervention on the circumflex artery   tomorrow and I will discuss this with Dr. Excell Seltzer.               Nicholas [name] Juanda Chance, MD, [facility]   Electronically Signed            BRB/MEDQ  D:  07/03/2008  T:  07/04/2008  Job:  [id number]     (07/04/2008)    Current Medications (verified): 1)  Protonix 40 Mg Solr (Pantoprazole Sodium) .Marland Kitchen.. 1 Tab Once Daily 2)  Plavix 75 Mg Tabs (Clopidogrel Bisulfate) .Marland Kitchen.. 1 Tab Once Daily 3)  Actos 30 Mg Tabs (Pioglitazone Hcl) .Marland Kitchen.. 1 Tab Once Daily 4)  Aspirin Ec 325 Mg Tbec (Aspirin) .... Take One Tablet By Mouth Daily 5)  Metoprolol Tartrate 25 Mg Tabs (Metoprolol Tartrate) .... Take 1 Tablet By Mouth Once A Day 6)  Lidoderm 5 % Ptch (Lidocaine) .... Apply Patch To Skin At Location of Pain Daily For Up To 12 Hours, Then Remove Patch.  Allergies (  verified): No Known Drug Allergies  Past History:  Past Medical History: Last updated: 06/07/2009 CAD - DES to PLB, BMS to mid and distal RCA, DES to circumflex and distal LAD, 6/10 Diabetes Type 2 Hyperlipidemia Myocardial Infarction G E R D TCS 9/08-->Hyperplastic rectosigmoid polyp EGD 9/08-->small hiatal hernia, bulbar erosions  Social History: Last updated: 06/07/2009 Full Time, self-employed Married  Tobacco Use - Yes, 1ppd Alcohol Use - no Regular Exercise - no Drug Use - no pt has 2 children alive and well      Review of Systems  The patient denies anorexia, fever, syncope, peripheral edema, prolonged cough, headaches, hemoptysis, melena, hematochezia, and severe indigestion/heartburn.         Otherwise reviewed and negative except as outlined.  Vital Signs:  Patient profile:   50 year old male Weight:      186  pounds Pulse rate:   95 / minute BP sitting:   100 / 68  (right arm)  Vitals Entered By: Dreama Saa, CNA (Jun 20, 2009 2:28 PM)  Physical Exam  Additional Exam:  Comfortable in no acute distress. HEENT: Conjuctivae and lids normal, oropharynx clear with moist mucosa. Neck: Supple, no elevated JVP, no loud carotid bruits, no thyromegaly or tenderness. Lungs: Nonlabored breathing at rest. CTA without rales or wheezes. Cor: PMI nondisplaced. RRR, normal S1/S2. No pathologic systolic murmurs. No S3 or rub. Abd: Soft, NTND.  No HSM. No bruits. Normoactive bowel sounds. Neuropsych: A&Ox3, affect grossly normal.    EKG  Procedure date:  06/20/2009  Findings:      Sinus rhythm at 92 beats per minute with incomplete right bundle branch block pattern, QTC of 469 ms.  Impression & Recommendations:  Problem # 1:  CORONARY ATHEROSCLEROSIS NATIVE CORONARY ARTERY (ICD-414.01)  No clear-cut angina on medical therapy with prior history of revascularization in a multivessel distribution as noted. Electrocardiogram is nonspecific. No specific medication changes are made today. Followup will be arranged for 6 months.  His updated medication list for this problem includes:    Plavix 75 Mg Tabs (Clopidogrel bisulfate) .Marland Kitchen... 1 tab once daily    Aspirin Ec 325 Mg Tbec (Aspirin) .Marland Kitchen... Take one tablet by mouth daily    Metoprolol Tartrate 25 Mg Tabs (Metoprolol tartrate) .Marland Kitchen... Take 1 tablet by mouth once a day  Problem # 2:  CHEST PAIN (ICD-786.50)  Very atypical as outlined above. Patient reports pending evaluation with Nicholas [name] neurology  His updated medication list for this problem includes:    Plavix 75 Mg Tabs (Clopidogrel bisulfate) .Marland Kitchen... 1 tab once daily    Aspirin Ec 325 Mg Tbec (Aspirin) .Marland Kitchen... Take one tablet by mouth daily    Metoprolol Tartrate 25 Mg Tabs (Metoprolol tartrate) .Marland Kitchen... Take 1 tablet by mouth once a day  Problem # 3:  HYPERLIPIDEMIA (ICD-272.4)  Patient presently not  on statin therapy. Need followup lipid profile liver function tests prior to his next visit.  Future Orders: T-Hepatic Function [id number]) ... 12/03/2009 T-Lipid Profile [id number]) ... 12/03/2009  Patient Instructions: 1)  Your physician recommends that you schedule a follow-up appointment in: 6 months 2)  Your physician recommends that you return for lab work in: December 03, 2009, just before next office visit 3)  Your physician recommends that you continue on your current medications as directed. Please refer to the Current Medication list given to you today.

## 2010-03-14 ENCOUNTER — Encounter: Payer: Self-pay | Admitting: Cardiology

## 2010-03-14 ENCOUNTER — Ambulatory Visit (INDEPENDENT_AMBULATORY_CARE_PROVIDER_SITE_OTHER): Payer: BC Managed Care – PPO | Admitting: Cardiology

## 2010-03-14 DIAGNOSIS — I2510 Atherosclerotic heart disease of native coronary artery without angina pectoris: Secondary | ICD-10-CM

## 2010-03-14 DIAGNOSIS — R079 Chest pain, unspecified: Secondary | ICD-10-CM

## 2010-03-14 DIAGNOSIS — E782 Mixed hyperlipidemia: Secondary | ICD-10-CM

## 2010-03-15 ENCOUNTER — Encounter: Payer: Self-pay | Admitting: Cardiology

## 2010-03-20 NOTE — Letter (Signed)
Summary: [place] [facility] [employer], [facility] at [employer]  [address] [place] [address]   Phone: [phone]  Fax: [phone]     March 14, 2010 MRN: [id number]   LYFE MONGER [address] [place], [place]  [address]      YOUR LAB WORK IS DUE  May 07, 2010 _________________________________________  Please go to [facility], located across the street from [facility] on the second floor.  Hours are Monday - Friday 7am until 7:30pm         Saturday 8am until 12noon    _X_  DO NOT EAT OR DRINK AFTER MIDNIGHT EVENING PRIOR TO LABWORK  __ YOUR LABWORK IS NOT FASTING --YOU MAY EAT PRIOR TO LABWORK

## 2010-03-20 NOTE — Letter (Signed)
Summary: Percival Treadmill (Nuc Med Stress)  [facility]  [address], [place] [address]   Phone: [phone]  Fax: [phone]    Nuclear Medicine 1-Day Stress Test Information Sheet  Re:     Nicholas [name]   DOB:     1960-06-24 MRN:     [id number] Weight:  Appointment Date: Register at: Appointment Time: Referring MD:  _X__Exercise Stress  __Adenosine   __Dobutamine  __Lexiscan  __Persantine   __Thallium  Urgency: ____1 (next day)   ____2 (one week)    ____3 (PRN)  Patient will receive Follow Up call with results: Patient needs follow-up appointment:  Instructions regarding medication:  How to prepare for your stress test: 1. DO NOT eat or drink after midnight the night before test. This includes no caffeine (coffee, tea, sodas, chocolate) if you were instructed to take your medications, drink water with it.  You may take all of your medications the morning of test. 2. DO NOT use any tobacco products for at leaset 8 hours prior to arrival. 3. DO NOT wear dresses or any clothing that may have metal clasps or buttons. 4. Wear short sleeve shirts, loose clothing, and comfortalbe walking shoes. 5. DO NOT use lotions, oils or powder on your chest before the test. 6. The test will take approximately 3-4 hours from the time you arrive until completion. 7. To register the day of the test, go to the Short Stay entrance at [facility]. 8. If you must cancel your test, call [phone] as soon as you are aware.  After you arrive for test:   When you arrive at [facility], you will go to Short Stay to be registered. They will then send you to Radiology to check in. The Nuclear Medicine Tech will get you and start an IV in your arm or hand. A small amount of a radioactive tracer will then be injected into your IV. This tracer will then have to circulate for 30-45 minutes. During this time you will wait in the waiting room and you will be able to drink  something without caffeine. A series of pictures will be taken of your heart follwoing this waiting period. After the 1st set of pictures you will go to the stress lab to get ready for your stress test. During the stress test, another small amount of a radioactive tracer will be injected through your IV. When the stress test is complete, there is a short rest period while your heart rate and blood pressure will be monitored. When this monitoring period is complete you will have another set of pictrues taken. (The same as the 1st set of pictures). These pictures are taken between 15 minutes and 1 hour after the stress test. The time depends on the type of stress test you had. Your doctor will inform you of your test results within 7 days after test.    The possibilities of certain changes are possible during the test. They include abnormal blood pressure and disorders of the heart. Side effects of persantine or adenosine can include flushing, chest pain, shortness of breath, stomach tightness, headache and light-headedness. These side effects usually do not last long and are self-resolving. Every effort will be made to keep you comfortable and to minimize complications by obtaining a medical history and by close observation during the test. Emergency equipment, medications, and trained personnel are available to deal with any unusual situation which may arise.  Please notify office at  least 48 hours in advance if you are unable to keep this appt.

## 2010-03-20 NOTE — Assessment & Plan Note (Signed)
Summary: ROV  Medications Added GABAPENTIN 300 MG CAPS (GABAPENTIN) take 1 tab daily      Allergies Added: NKDA  Visit Type:  Follow-up Primary Provider:  Dr. Assunta Found   History of Present Illness: 50 year old male presents for followup. He was seen in October 2011, at which time he was clinically stable.  Followup labs from January 2012 showed cholesterol 284, triglycerides 549, HDL 26, LDL not calculated, AST 17, ALT 26. He had not been taking simvastatin during this time, and has subsequently resumed.  He reports somewhat progressive chest pain, although atypical in description. He describes "twinges" and sudden "sharp" pains at a variety of positions in the chest, mainly at rest. He is not reporting any progressive chest pain with exertion, has stable dyspnea on exertion. Last cardiac intervention was in 2010, noted below.  He continues to smoke cigarettes. We have discussed smoking cessation over time.  Current Medications (verified): 1)  Protonix 40 Mg Solr (Pantoprazole Sodium) .Marland Kitchen.. 1 Tab Once Daily 2)  Plavix 75 Mg Tabs (Clopidogrel Bisulfate) .Marland Kitchen.. 1 Tab Once Daily 3)  Aspirin Ec 325 Mg Tbec (Aspirin) .... Take One Tablet By Mouth Daily 4)  Metoprolol Tartrate 25 Mg Tabs (Metoprolol Tartrate) .... Take 1 Tablet By Mouth Once A Day 5)  Nitrostat 0.4 Mg Subl (Nitroglycerin) .... Take As Directed For Chest Pain 6)  Simvastatin 20 Mg Tabs (Simvastatin) .... Take 1 Tablet By Mouth At Bedtime 7)  Gabapentin 300 Mg Caps (Gabapentin) .... Take 1 Tab Daily  Allergies (verified): No Known Drug Allergies  Past History:  Social History: Last updated: 06/07/2009 Full Time, self-employed Married  Tobacco Use - Yes, 1ppd Alcohol Use - no Regular Exercise - no Drug Use - no pt has 2 children alive and well      Past Medical History: CAD - DES to PLB, BMS to mid and distal RCA, DES to circumflex and distal LAD, 6/10 Diabetes Type 2 Hyperlipidemia Myocardial  Infarction G E R D TCS 9/08-->Hyperplastic rectosigmoid polyp EGD 9/08-->small hiatal hernia, bulbar erosions Helicobacter pylori  Past Surgical History: Collapsed lung 15 yrs ago, chest tube Wire in apex of right lung, since childhood, found in there incidentally Cholecystectomy  Review of Systems  The patient denies anorexia, fever, weight loss, peripheral edema, prolonged cough, hemoptysis, abdominal pain, melena, and hematochezia.         He has a chronic intermittent pain in the left side of his chest, sounds neuropathic in description. He states he is on gabapentin now for this, has had some improvement. He seems to relate a lot of this discomfort to his prior history of chest tube placement with collapsed lung. Otherwise reviewed and negative except as outlined.  Vital Signs:  Patient profile:   50 year old male Weight:      176 pounds BMI:     26.86 Pulse rate:   90 / minute BP sitting:   103 / 69  (left arm)  Vitals Entered By: Dreama Saa, CNA (March 14, 2010 2:45 PM)  Physical Exam  Additional Exam:  Comfortable in no acute distress. HEENT: Conjuctivae and lids normal, oropharynx clear with moist mucosa. Neck: Supple, no elevated JVP, no loud carotid bruits, no thyromegaly or tenderness. Lungs: Nonlabored breathing at rest. CTA without rales or wheezes. Cor: PMI nondisplaced. RRR, normal S1/S2. No pathologic systolic murmurs. No S3 or rub. Abd: Soft, NTND.  No HSM. No bruits. Normoactive bowel sounds. Skin: Warm and dry. Musculoskeletal: No kyphosis. Extremities: No pitting edema.  Neuropsychiatric: Alert and oriented x3, affect appropriate.    EKG  Procedure date:  03/14/2010  Findings:      Sinus rhythm at 85 beats per minute with incomplete right bundle branch block.  Impression & Recommendations:  Problem # 1:  CHEST PAIN (ICD-786.50)  Somewhat progressive, atypical overall, however in the setting of significant CAD with most recent  interventions in 2010. No followup ischemic testing since then. ECG is relatively stable. Plan a  Lexiscan Myoview on medical therapy for ischemic surveillance. Otherwise clinical visit in 3 months.  His updated medication list for this problem includes:    Plavix 75 Mg Tabs (Clopidogrel bisulfate) .Marland Kitchen... 1 tab once daily    Aspirin Ec 325 Mg Tbec (Aspirin) .Marland Kitchen... Take one tablet by mouth daily    Metoprolol Tartrate 25 Mg Tabs (Metoprolol tartrate) .Marland Kitchen... Take 1 tablet by mouth once a day    Nitrostat 0.4 Mg Subl (Nitroglycerin) .Marland Kitchen... Take as directed for chest pain  Problem # 2:  CORONARY ATHEROSCLEROSIS NATIVE CORONARY ARTERY (ICD-414.01)  History of multivessel interventions, described above. Patient reports compliance with aspirin and Plavix. Followup ischemic testing is being arranged.  His updated medication list for this problem includes:    Plavix 75 Mg Tabs (Clopidogrel bisulfate) .Marland Kitchen... 1 tab once daily    Aspirin Ec 325 Mg Tbec (Aspirin) .Marland Kitchen... Take one tablet by mouth daily    Metoprolol Tartrate 25 Mg Tabs (Metoprolol tartrate) .Marland Kitchen... Take 1 tablet by mouth once a day    Nitrostat 0.4 Mg Subl (Nitroglycerin) .Marland Kitchen... Take as directed for chest pain  Problem # 3:  TOBACCO ABUSE (ICD-305.1)  Continue to recommend smoking cessation.  Problem # 4:  HYPERLIPIDEMIA (ICD-272.4)  Reportedly back on simvastatin since January. Willl obtain fasting lipid profile and liver function tests in approximately 2 months.  His updated medication list for this problem includes:    Simvastatin 20 Mg Tabs (Simvastatin) .Marland Kitchen... Take 1 tablet by mouth at bedtime  Future Orders: T-Lipid Profile ([id number]) ... 05/07/2010 T-Hepatic Function [phone]) ... 05/07/2010  Other Orders: Nuclear Stress Test (Nuc Stress Test)  Patient Instructions: 1)  Your physician recommends that you schedule a follow-up appointment in: 3 months 2)  Your physician recommends that you return for lab work in: 2  months 3)  Your physician recommends that you continue on your current medications as directed. Please refer to the Current Medication list given to you today. 4)  Your physician has requested that you have an exercise stress myoview.  For further information please visit [email or web address]  Please follow instruction sheet, as given.

## 2010-03-26 ENCOUNTER — Other Ambulatory Visit: Payer: Self-pay | Admitting: Cardiology

## 2010-03-28 ENCOUNTER — Encounter (HOSPITAL_COMMUNITY): Payer: BC Managed Care – PPO

## 2010-03-28 ENCOUNTER — Encounter: Payer: Self-pay | Admitting: Adult Health

## 2010-03-28 ENCOUNTER — Encounter (HOSPITAL_COMMUNITY): Payer: Self-pay

## 2010-03-28 ENCOUNTER — Encounter (HOSPITAL_COMMUNITY)
Admission: RE | Admit: 2010-03-28 | Discharge: 2010-03-28 | Disposition: A | Discharge status: Home / Self Care | Payer: BC Managed Care – PPO | Source: Ambulatory Visit | Attending: Cardiology | Admitting: Cardiology

## 2010-03-28 ENCOUNTER — Encounter: Payer: Self-pay | Admitting: Cardiology

## 2010-03-28 ENCOUNTER — Encounter (INDEPENDENT_AMBULATORY_CARE_PROVIDER_SITE_OTHER): Payer: BC Managed Care – PPO

## 2010-03-28 DIAGNOSIS — I10 Essential (primary) hypertension: Secondary | ICD-10-CM | POA: Insufficient documentation

## 2010-03-28 DIAGNOSIS — R079 Chest pain, unspecified: Secondary | ICD-10-CM

## 2010-03-28 DIAGNOSIS — E119 Type 2 diabetes mellitus without complications: Secondary | ICD-10-CM | POA: Insufficient documentation

## 2010-03-28 DIAGNOSIS — I2510 Atherosclerotic heart disease of native coronary artery without angina pectoris: Secondary | ICD-10-CM | POA: Insufficient documentation

## 2010-03-28 MED ORDER — TECHNETIUM TC 99M TETROFOSMIN IV KIT
10.0000 | PACK | Freq: Once | INTRAVENOUS | Status: AC | PRN
  Administered 2010-03-28: 10.3 via INTRAVENOUS

## 2010-03-28 MED ORDER — TECHNETIUM TC 99M TETROFOSMIN IV KIT
30.0000 | PACK | Freq: Once | INTRAVENOUS | Status: AC | PRN
  Administered 2010-03-28: 31 via INTRAVENOUS

## 2010-04-04 NOTE — Letter (Signed)
Summary: [place] [facility], agricultural at [facility]  [address] [place] [address]   Phone: [phone]  Fax: [phone]      March 28, 2010 MRN: [id number]   Nicholas [name] [address] [place], [place]  [address]   Dear Mr. Weinfeld,  Your test ordered by Selena Batten has been reviewed by your physician (or physician assistant) and was found to be normal or stable. Your physician (or physician assistant) felt no changes were needed at this time.  ____ Echocardiogram  __X__ Cardiac Stress Test  ____ Lab Work  ____ Peripheral vascular study of arms, legs or neck  ____ CT scan or X-ray  ____ Lung or Breathing test  ____ Other:   Thank you.   Nona Dell, MD, F.A.C.C

## 2010-04-16 ENCOUNTER — Encounter: Payer: Self-pay | Admitting: Internal Medicine

## 2010-04-16 ENCOUNTER — Ambulatory Visit: Payer: BC Managed Care – PPO | Admitting: Internal Medicine

## 2010-04-16 LAB — DIFFERENTIAL
Eosinophils Relative: 1 % (ref 0–5)
Lymphocytes Relative: 32 % (ref 12–46)
Lymphs Abs: 2.4 10*3/uL (ref 0.7–4.0)
Monocytes Absolute: 0.5 10*3/uL (ref 0.1–1.0)

## 2010-04-16 LAB — D-DIMER, QUANTITATIVE: D-Dimer, Quant: 0.38 ug/mL-FEU (ref 0.00–0.48)

## 2010-04-16 LAB — CBC
HCT: 41.5 % (ref 39.0–52.0)
MCV: 78.9 fL (ref 78.0–100.0)
Platelets: 156 10*3/uL (ref 150–400)
RDW: 14 % (ref 11.5–15.5)
WBC: 7.8 10*3/uL (ref 4.0–10.5)

## 2010-04-16 LAB — BASIC METABOLIC PANEL
BUN: 11 mg/dL (ref 6–23)
Creatinine, Ser: 0.84 mg/dL (ref 0.4–1.5)
GFR calc non Af Amer: >60 mL/min (ref >60)
Glucose, Bld: 236 mg/dL — ABNORMAL HIGH (ref 70–99)
Potassium: 4 mEq/L (ref 3.5–5.1)

## 2010-04-16 LAB — POCT CARDIAC MARKERS
CKMB, poc: <1 ng/mL — ABNORMAL LOW (ref 1.0–8.0)
Myoglobin, poc: 53 ng/mL (ref 12–200)
Troponin i, poc: <0.05 ng/mL (ref 0.00–0.09)

## 2010-04-17 LAB — BASIC METABOLIC PANEL
BUN: 12 mg/dL (ref 6–23)
Calcium: 9.4 mg/dL (ref 8.4–10.5)
GFR calc non Af Amer: >60 mL/min (ref >60)
Potassium: 4.3 mEq/L (ref 3.5–5.1)
Sodium: 136 mEq/L (ref 135–145)

## 2010-04-17 LAB — HEPATIC FUNCTION PANEL
ALT: 62 U/L — ABNORMAL HIGH (ref 0–53)
Indirect Bilirubin: 0.7 mg/dL (ref 0.3–0.9)
Total Protein: 6.8 g/dL (ref 6.0–8.3)

## 2010-04-17 LAB — GLUCOSE, CAPILLARY: Glucose-Capillary: 217 mg/dL — ABNORMAL HIGH (ref 70–99)

## 2010-04-22 LAB — DIFFERENTIAL
Lymphocytes Relative: 30 % (ref 12–46)
Lymphs Abs: 2.5 10*3/uL (ref 0.7–4.0)
Monocytes Relative: 9 % (ref 3–12)
Neutro Abs: 4.9 10*3/uL (ref 1.7–7.7)
Neutrophils Relative %: 59 % (ref 43–77)

## 2010-04-22 LAB — POCT CARDIAC MARKERS
CKMB, poc: <1 ng/mL — ABNORMAL LOW (ref 1.0–8.0)
Myoglobin, poc: 48.1 ng/mL (ref 12–200)
Myoglobin, poc: 48.9 ng/mL (ref 12–200)

## 2010-04-22 LAB — BASIC METABOLIC PANEL
BUN: 14 mg/dL (ref 6–23)
Calcium: 9.2 mg/dL (ref 8.4–10.5)
Chloride: 105 mEq/L (ref 96–112)
Creatinine, Ser: 0.79 mg/dL (ref 0.4–1.5)
GFR calc Af Amer: >60 mL/min (ref >60)
GFR calc non Af Amer: >60 mL/min (ref >60)

## 2010-04-22 LAB — CBC
MCV: 81.2 fL (ref 78.0–100.0)
Platelets: 144 10*3/uL — ABNORMAL LOW (ref 150–400)
RBC: 5.61 MIL/uL (ref 4.22–5.81)
WBC: 8.4 10*3/uL (ref 4.0–10.5)

## 2010-04-24 ENCOUNTER — Encounter: Payer: Self-pay | Admitting: Internal Medicine

## 2010-04-24 ENCOUNTER — Ambulatory Visit (INDEPENDENT_AMBULATORY_CARE_PROVIDER_SITE_OTHER): Payer: BC Managed Care – PPO | Admitting: Internal Medicine

## 2010-04-24 DIAGNOSIS — K6289 Other specified diseases of anus and rectum: Secondary | ICD-10-CM

## 2010-04-24 DIAGNOSIS — R079 Chest pain, unspecified: Secondary | ICD-10-CM

## 2010-04-24 DIAGNOSIS — K219 Gastro-esophageal reflux disease without esophagitis: Secondary | ICD-10-CM

## 2010-04-24 DIAGNOSIS — K629 Disease of anus and rectum, unspecified: Secondary | ICD-10-CM

## 2010-04-25 ENCOUNTER — Telehealth: Payer: Self-pay | Admitting: Internal Medicine

## 2010-04-25 NOTE — Telephone Encounter (Signed)
Pt informed

## 2010-04-25 NOTE — Telephone Encounter (Signed)
Per Dr Jena Gauss pt needs referral to see Dr Adriana Simas in [place] for management of his left chest wall pain..referral has been made to Dr Ozzie Hoyle office. Pt doesn't know about this Dr Luvenia Starch recommendations yet.

## 2010-04-25 NOTE — Telephone Encounter (Signed)
Pt is aware of referral

## 2010-05-04 LAB — CBC
Hemoglobin: 14.5 g/dL (ref 13.0–17.0)
MCHC: 35.8 g/dL (ref 30.0–36.0)
MCV: 80.8 fL (ref 78.0–100.0)
RBC: 5 MIL/uL (ref 4.22–5.81)
RDW: 14.8 % (ref 11.5–15.5)

## 2010-05-04 LAB — POCT CARDIAC MARKERS
CKMB, poc: <1 ng/mL — ABNORMAL LOW (ref 1.0–8.0)
Myoglobin, poc: 53.4 ng/mL (ref 12–200)
Troponin i, poc: <0.05 ng/mL (ref 0.00–0.09)

## 2010-05-04 LAB — BASIC METABOLIC PANEL
CO2: 23 mEq/L (ref 19–32)
Calcium: 9.8 mg/dL (ref 8.4–10.5)
Chloride: 108 mEq/L (ref 96–112)
Creatinine, Ser: 1.01 mg/dL (ref 0.4–1.5)
Glucose, Bld: 152 mg/dL — ABNORMAL HIGH (ref 70–99)

## 2010-05-04 LAB — DIFFERENTIAL
Basophils Absolute: 0.1 10*3/uL (ref 0.0–0.1)
Basophils Relative: 1 % (ref 0–1)
Eosinophils Absolute: 0.2 10*3/uL (ref 0.0–0.7)
Monocytes Absolute: 0.8 10*3/uL (ref 0.1–1.0)
Monocytes Relative: 8 % (ref 3–12)
Neutro Abs: 5.7 10*3/uL (ref 1.7–7.7)
Neutrophils Relative %: 56 % (ref 43–77)

## 2010-05-05 ENCOUNTER — Encounter: Payer: Self-pay | Admitting: Internal Medicine

## 2010-05-05 DIAGNOSIS — K629 Disease of anus and rectum, unspecified: Secondary | ICD-10-CM | POA: Insufficient documentation

## 2010-05-05 NOTE — Progress Notes (Signed)
  Subjective:    Patient ID: Nicholas [name], male    DOB: 1960/09/21, 50 y.o.   MRN: [id number]  HPIChronic chest wall pain and anal lesions    Review of Systems Review of Systems: Gen: Denies any fever, chills, sweats, anorexia, fatigue, weakness, malaise, weight loss, and sleep disorder CV: Denies chest pain, angina, palpitations, syncope, orthopnea, PND, peripheral edema, and claudication. Resp: Denies dyspnea at rest, dyspnea with exercise, cough, sputum, wheezing, coughing up blood, and pleurisy. GI: Denies vomiting blood, jaundice, and fecal incontinence.   Denies dysphagia or odynophagia. Derm: Denies rash, itching, dry skin, hives, moles, warts, or unhealing ulcers.  Psych: Denies depression, anxiety, memory loss, suicidal ideation, hallucinations, paranoia, and confusion. Heme: Denies bruising, bleeding, and enlarged lymph nodes.      Objective:   Physical Exam     Physical exam: Gen : Patient is alert conversant in no acute distress  HEENT :  No sclera icterus conjunctiva are pink  Chest :  Lungs are clear.  Cardiac exam regular rate and rhythm without murmur, gallop, rub  Abdomen : Nondistended positive bowel sounds soft non-tender without appreciable mass or organomegaly. Extremity exam: No lower extremity edema   Rectal exam : tiny external tags present ; digital exam negative. Stool hemocult negative.    Assessment & Plan:

## 2010-05-05 NOTE — Assessment & Plan Note (Signed)
Well controlled on pantoprazole.

## 2010-05-05 NOTE — Assessment & Plan Note (Signed)
Trivial anal papilla / tags - external lesions. I recommended he see a dermatologist for definitive local treatment- Pt wants to think about it. Next TCS due 2018.

## 2010-05-05 NOTE — Assessment & Plan Note (Signed)
Tiny external  anal tags; recommend dermatology consultation for definitive mgt - pt wants to think about it; next tcs 2018

## 2010-05-05 NOTE — Patient Instructions (Signed)
Patient will be referred to Dr. Eduard Clos

## 2010-05-05 NOTE — Assessment & Plan Note (Signed)
Patient has chronic chest wall pain aside from GERD and CAD which are separate issues.  I suspect he has a neuropathic or musculoskeletal basis for the pain.  This is more of a significant agravation for him than anything else given the extensive negative work-up thus far. I believe we should focus on management of his symptoms.  To this end, I was able to speak with Dr. Phillips Odor about him while he was in the office. It is my recommendation he see Dr. Ines Bloomer Dalton-Bethea to see if she can help him.  Dr. Phillips Odor agreed.

## 2010-05-06 LAB — CBC
HCT: 38.1 % — ABNORMAL LOW (ref 39.0–52.0)
HCT: 38.7 % — ABNORMAL LOW (ref 39.0–52.0)
HCT: 38.7 % — ABNORMAL LOW (ref 39.0–52.0)
HCT: 39.2 % (ref 39.0–52.0)
Hemoglobin: 13.2 g/dL (ref 13.0–17.0)
Hemoglobin: 13.2 g/dL (ref 13.0–17.0)
MCHC: 33.7 g/dL (ref 30.0–36.0)
MCHC: 34.1 g/dL (ref 30.0–36.0)
MCHC: 34.1 g/dL (ref 30.0–36.0)
MCV: 79.9 fL (ref 78.0–100.0)
MCV: 80.3 fL (ref 78.0–100.0)
MCV: 80.9 fL (ref 78.0–100.0)
MCV: 81.3 fL (ref 78.0–100.0)
Platelets: 145 10*3/uL — ABNORMAL LOW (ref 150–400)
Platelets: 129 10*3/uL — ABNORMAL LOW (ref 150–400)
Platelets: 131 10*3/uL — ABNORMAL LOW (ref 150–400)
Platelets: 134 10*3/uL — ABNORMAL LOW (ref 150–400)
RBC: 5.42 MIL/uL (ref 4.22–5.81)
RBC: 4.68 MIL/uL (ref 4.22–5.81)
RBC: 4.77 MIL/uL (ref 4.22–5.81)
RBC: 4.79 MIL/uL (ref 4.22–5.81)
RBC: 4.83 MIL/uL (ref 4.22–5.81)
RDW: 14 % (ref 11.5–15.5)
WBC: 9.4 10*3/uL (ref 4.0–10.5)
WBC: 10.4 10*3/uL (ref 4.0–10.5)
WBC: 8.9 10*3/uL (ref 4.0–10.5)
WBC: 9.4 10*3/uL (ref 4.0–10.5)

## 2010-05-06 LAB — D-DIMER, QUANTITATIVE: D-Dimer, Quant: 0.22 ug/mL-FEU (ref 0.00–0.48)

## 2010-05-06 LAB — GLUCOSE, CAPILLARY
Glucose-Capillary: 165 mg/dL — ABNORMAL HIGH (ref 70–99)
Glucose-Capillary: 109 mg/dL — ABNORMAL HIGH (ref 70–99)
Glucose-Capillary: 113 mg/dL — ABNORMAL HIGH (ref 70–99)
Glucose-Capillary: 113 mg/dL — ABNORMAL HIGH (ref 70–99)
Glucose-Capillary: 122 mg/dL — ABNORMAL HIGH (ref 70–99)
Glucose-Capillary: 144 mg/dL — ABNORMAL HIGH (ref 70–99)
Glucose-Capillary: 93 mg/dL (ref 70–99)
Glucose-Capillary: 97 mg/dL (ref 70–99)

## 2010-05-06 LAB — HEPARIN LEVEL (UNFRACTIONATED)
Heparin Unfractionated: 0.32 IU/mL (ref 0.30–0.70)
Heparin Unfractionated: 0.35 IU/mL (ref 0.30–0.70)
Heparin Unfractionated: 0.46 IU/mL (ref 0.30–0.70)
Heparin Unfractionated: 0.6 IU/mL (ref 0.30–0.70)

## 2010-05-06 LAB — LIPID PANEL
Cholesterol: 186 mg/dL (ref 0–200)
HDL: 26 mg/dL — ABNORMAL LOW (ref >39)
Total CHOL/HDL Ratio: 7.2 RATIO
Triglycerides: 309 mg/dL — ABNORMAL HIGH (ref <150)

## 2010-05-06 LAB — DIFFERENTIAL
Eosinophils Absolute: 0.1 10*3/uL (ref 0.0–0.7)
Lymphocytes Relative: 26 % (ref 12–46)
Lymphs Abs: 2.4 10*3/uL (ref 0.7–4.0)
Monocytes Relative: 7 % (ref 3–12)
Neutro Abs: 6.2 10*3/uL (ref 1.7–7.7)
Neutrophils Relative %: 66 % (ref 43–77)

## 2010-05-06 LAB — BASIC METABOLIC PANEL
BUN: 11 mg/dL (ref 6–23)
BUN: 8 mg/dL (ref 6–23)
CO2: 29 mEq/L (ref 19–32)
Calcium: 9.2 mg/dL (ref 8.4–10.5)
Chloride: 105 mEq/L (ref 96–112)
Chloride: 108 mEq/L (ref 96–112)
Creatinine, Ser: 0.77 mg/dL (ref 0.4–1.5)
GFR calc Af Amer: >60 mL/min (ref >60)
GFR calc Af Amer: >60 mL/min (ref >60)
GFR calc non Af Amer: >60 mL/min (ref >60)
GFR calc non Af Amer: >60 mL/min (ref >60)
Potassium: 4.1 mEq/L (ref 3.5–5.1)
Potassium: 4.2 mEq/L (ref 3.5–5.1)
Sodium: 140 mEq/L (ref 135–145)
Sodium: 140 mEq/L (ref 135–145)

## 2010-05-06 LAB — CARDIAC PANEL(CRET KIN+CKTOT+MB+TROPI)
CK, MB: 4 ng/mL (ref 0.3–4.0)
Relative Index: 3.3 — ABNORMAL HIGH (ref 0.0–2.5)
Total CK: 115 U/L (ref 7–232)
Total CK: 122 U/L (ref 7–232)
Total CK: 132 U/L (ref 7–232)

## 2010-05-06 LAB — POCT CARDIAC MARKERS
CKMB, poc: <1 ng/mL — ABNORMAL LOW (ref 1.0–8.0)
Myoglobin, poc: 52.1 ng/mL (ref 12–200)
Troponin i, poc: <0.05 ng/mL (ref 0.00–0.09)
Troponin i, poc: <0.05 ng/mL (ref 0.00–0.09)

## 2010-05-06 LAB — COMPREHENSIVE METABOLIC PANEL
ALT: 29 U/L (ref 0–53)
AST: 29 U/L (ref 0–37)
CO2: 24 mEq/L (ref 19–32)
Calcium: 8.4 mg/dL (ref 8.4–10.5)
Chloride: 109 mEq/L (ref 96–112)
GFR calc Af Amer: >60 mL/min (ref >60)
GFR calc non Af Amer: >60 mL/min (ref >60)
Sodium: 141 mEq/L (ref 135–145)

## 2010-06-11 NOTE — Cardiovascular Report (Signed)
NAME:  Nicholas [name], Nicholas NO.:  [id number]   MEDICAL RECORD NO.:  [id number]          PATIENT TYPE:  INP   LOCATION:  2906                         FACILITY:  [facility]   PHYSICIAN:  Everardo Beals. Juanda Chance, MD, FACCDATE OF BIRTH:  July 14, 1960   DATE OF PROCEDURE:  07/03/2008  DATE OF DISCHARGE:                            CARDIAC CATHETERIZATION   CLINICAL HISTORY:  Nicholas [name] is a 50 year old and has a history of  diabetes and tobacco use and hyperlipidemia.  He was admitted with chest  pain and positive troponins consistent with a non-ST-elevation MI.  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  His wife is a Chartered loss adjuster.   PROCEDURE:  The procedure was performed via the right femoral artery and  arterial sheath and 5-French preformed coronary catheters.  A front wall  arterial puncture was performed and Omnipaque contrast was used.  After  completion of diagnosis study we made a decision to intervention on the  tandem lesions in the mid and distal right coronary artery and the  posterolateral branch of the right coronary artery.   The patient was given Angiomax bolus and infusion.  He was given 600 mg  of Plavix and 20 mg of Pepcid.  He had previously been given four  chewable aspirin.  We used a JR-4 6-French guiding catheter with side  holes.  We passed a PT2 light support wire down the right coronary  artery and crossed the 3 lesions without too much difficulty.  We  predilated the mid and distal lesions with a 3.0 x 15 mm balloon and we  predilated the lesion in the posterolateral branch with a 2.25 x 15-mm  apex balloon.  We then deployed a 2.5 x 28 mm Cypher stent in the long  lesion in the posterolateral branch performing this with 1 inflation of  9 atmospheres for 30 seconds.  We postdilated it with a 2.75 x 15-mm [place]  apex performing two inflations up to 16 atmospheres for 30 seconds.  We  then deployed a 3.5 x 18-mm Vision stent at the  lesion in the mid-right  coronary artery performing this with 1 inflation of 18 atmospheres for  30 seconds.  We then deployed a 3.5 x 20-mm Liberte at the lesion in the  distal right coronary artery performing this with 1 inflation of 8  atmospheres for 30 seconds.  We postdilated both bare-metal stents with  a 4.0 x 15-mm J[place] apex performing one inflation up to 16 atmospheres for  30 seconds in both stents.  Final diagnostic studies were then performed  through the guiding catheter.  The patient tolerated the procedure well  and left the laboratory in satisfactory condition.   RESULTS:  The aortic pressure was 102/54 with mean of 81.  Left  ventricular pressure was 102/16.   The right coronary artery.  The right coronary artery was a moderately  large vessel that gave rise to several right ventricular branches, a  small posterior descending and a small posterolateral branch and a large  posterolateral branch.  There was 90% stenosis in  the midvessel, 95%  stenosis in the distal vessel and 90% stenosis in the first part of the  large posterolateral branch.   The left main coronary artery.  The left main coronary artery was free  of significant disease.   The left anterior descending artery.  The left anterior descending  artery was diffusely irregular in its mid and distal portion.  There was  about tandem 50% stenosis in the midvessel and there was a 90% stenosis  in the distal vessel near the apex.   The circumflex artery.  The circumflex artery gave rise to an atrial  branch, a very small marginal branch, a second marginal branch that was  completely occluded.  There was a 90% stenosis just before the takeoff  of the second marginal branch.  The occluded distal vessel filled via  collaterals from the second marginal branch.   The left ventriculogram.  The left ventriculogram performed in the RAO  projection showed mild hypokinesis of the mid inferior wall.  The  estimated  ejection fraction was 60%.   Following stenting of the lesion in the distal right coronary artery the  stenosis improved from 90% to 0%.   Following stenting of the lesion in the distal right coronary artery the  stenosis improved from 95% to 0%.   Following stenting of the lesion in the mid-right coronary artery, the  stenosis improved from 90% to 0%.   CONCLUSION:  1. Coronary artery disease status post recent non-ST-elevation      myocardial infarction with 50% stenosis in LAD, 90% stenosis of      distal tip of the LAD, 90% stenosis of the proximal circumflex      artery with total occlusion of the mid circumflex artery, 90% mid      and 95% distal stenosis in the right coronary artery and 90%      stenosis in the posterolateral branch of the right coronary artery      with mid inferior wall hypokinesis and an estimated ejection      fraction of 60%.  2. Successful PCI of the lesion in the large posterolateral branch of      the circumflex artery with a drug-eluting stent with improvement in      center narrowing from 90% to 0%.  3. Successful treatment of tandem lesions in the mid and distal right      coronary artery with non-overlapping bare-metal stent with      improvement in mid stenosis from 90% to 0%, improvement in distal      stenosis from 95% to 0%.   DISPOSITION:  The patient returned to post angio room for further  observation.  We will plan intervention on the circumflex artery  tomorrow and I will discuss this with Dr. Excell Seltzer.      [name], MD, [facility]  Electronically Signed     BRB/MEDQ  D:  07/03/2008  T:  07/04/2008  Job:  [id number]   cc:   Noralyn Pick. Eden Emms, MD, [facility]  Jesse Sans. Daleen Squibb, MD, [facility]  Cardiopulmonary Lab  Corrie Mckusick, M.D.

## 2010-06-11 NOTE — Op Note (Signed)
NAME:  Nicholas [name], Nicholas [name]                ACCOUNT NO.:  [id number]   MEDICAL RECORD NO.:  [id number]          PATIENT TYPE:  AMB   LOCATION:  DAY                           FACILITY:  [facility]   PHYSICIAN:  R. Roetta Sessions, M.D. DATE OF BIRTH:  06/28/1960   DATE OF PROCEDURE:  10/12/2006  DATE OF DISCHARGE:                               OPERATIVE REPORT   PROCEDURE:  Diagnostic EGD followed by colonoscopy with snare  polypectomy.   INDICATIONS FOR PROCEDURE:  50 year old gentleman with 57-month history  of left-sided abdominal chest pain.  He has had a cardiac workup which  was negative.  Chest CT also negative.  He has had longstanding  gastroesophageal reflux disease symptoms, now well-controlled on  Prevacid 30 mg orally daily.  CT of the abdomen and pelvis demonstrated  a poorly seen stomach, as it was not very well insufflated.  EGD and  colonoscopy are now being done.  It is notable today that the patient  localizes his pain to well up above his left costal margin along the  left anterior chest wall.  He has localized tenderness along his ribs,  midchest between the midclavicular area and left axillary line.  EGD and  colonoscopy are now being done.  This approach has been discussed with  the patient at length.  The potential risks, benefits, and alternatives  have been reviewed.  It notable that the patient did have a fatty liver  on CT.  He has normal liver enzymes.  Amylase and lipase came back  normal.  However, he has a blood sugar of 167.   PROCEDURE NOTE:  O2 saturation, blood pressure, pulse, and respirations  were monitored throughout the entire procedure.  Conscious sedation:  Versed 5 mg IV, Demerol 125 mg IV in divided doses.  Cetacaine spray for  topical oropharyngeal anesthesia.  Instrument:  Pentax video chip  system.   EGD FINDINGS:  Esophagus:  Examination of the tubular esophagus revealed  no mucosal abnormalities.  The EG junction was easily traversed.   Stomach:   The gastric cavity was empty and insufflated well with air.  Thorough examination of the gastric mucosa including retroflex view of  the proximal stomach and esophagogastric junction demonstrated only a  small hiatal hernia.  The gastric mucosa otherwise appeared normal.  The  pylorus was patent and easily traversed.  Examination of the duodenum,  including the bulb and second portion revealed diffuse bulbar erosions.  There was no infiltrating process or frank ulcer seen.   THERAPEUTIC/DIAGNOSTIC MANEUVERS:  None.   The patient tolerated the procedure well and was prepared for  colonoscopy.   COLONOSCOPY FINDINGS:  Digital rectal exam revealed no abnormalities.   Endoscopic findings:  The prep was adequate.   The colonic mucosa was surveyed from the rectosigmoid junction through  the left, transverse, right colon to area of the appendiceal orifice,  ileocecal valve, and cecum.  These structures were well seen and  photographed for the record.  The terminal ileum was intubated 10 cm.  From this level, the scope was slowly withdrawn.  All previously  mentioned  mucosal surfaces were again seen.  The colonic mucosa as well  as the terminal ileal mucosa appeared normal.  The scope was pulled down  into the rectum where thorough examination of the rectal mucosa  including retroflex view of the anal verge demonstrated a 6-mm  rectosigmoid polyp which was hot snare/removed.  The remainder of the  rectal mucosa appeared normal.  The patient tolerated both procedures  well and was reactive in endoscopy.   EGD IMPRESSION:  1. Normal esophagus.  2. Small hiatal hernia, otherwise normal stomach.  3. Bulbar erosions; otherwise, the first and second portions of the      duodenum appeared unremarkable.   COLONOSCOPY IMPRESSION:  Rectosigmoid polyp status post hot snare  polypectomy; otherwise, normal rectum and terminal ileum.   Nicholas [name] current discomfort is certainly emanating from his  chest  wall.  His reflux symptoms are well-controlled on Prevacid.   He has a fatty liver and an elevated blood sugar.   RECOMMENDATIONS:  1. Continue Prevacid 30 mg orally daily.  2. Follow up with Dr. Phillips Odor in regards to probable costochondritis      and elevated blood sugar.  3. No aspirin or aspirin medications for the next 10 days.  4. Will follow up on pathology.  5. Further recommendations to follow.      Jonathon Bellows, M.D.  Electronically Signed     RMR/MEDQ  D:  10/12/2006  T:  10/12/2006  Job:  [id number]   cc:   Noralyn Pick. Eden Emms, MD, [facility]  [address]  [address]  [place]  [place] [address]   Corrie Mckusick, M.D.  Fax: [phone]

## 2010-06-11 NOTE — Discharge Summary (Signed)
NAME:  Nicholas [name], DREES NO.:  [id number]   MEDICAL RECORD NO.:  [id number]          PATIENT TYPE:  INP   LOCATION:  2507                         FACILITY:  [facility]   PHYSICIAN:  Veverly Fells. Excell Seltzer, MD  DATE OF BIRTH:  Aug 22, 1960   DATE OF ADMISSION:  06/30/2008  DATE OF DISCHARGE:  07/05/2008                               DISCHARGE SUMMARY   PRIMARY CARDIOLOGIST:  Theron Arista C. Eden Emms, MD, [facility]   PRIMARY CARE [place]:  Dr. Phillips Odor.   DISCHARGE DIAGNOSES:  Non-ST elevation myocardial infarction, status  post cardiac catheterization and successful percutaneous coronary  intervention to large posterior lateral branch of the circumflex artery  with DES, nonoverlapping bare-metal stent placement to tandem lesions in  the mid and distal right coronary artery; status post second cardiac  catheterization with successful percutaneous coronary intervention using  DES to the obtuse marginal branch of the circumflex and successful  percutaneous coronary intervention using DES to the apical portion of  the left anterior descending.   SECONDARY DIAGNOSES:  1. Hypertriglyceridemia/hyperlipidemia.  2. Tobacco abuse.  3. Gastroesophageal reflux disease.  4. Diabetes mellitus.  5. History of palpitations.   ALLERGIES:  NKDA.   PROCEDURES:  1. 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  2. Chest x-ray showing foreign body over the right lung apex,      unchanged.  3. EKG showing sinus bradycardia at a rate of 59, no acute ST - T-wave      changes, no significant Q-waves, normal axis, no evidence of      hypertrophy, PR 152, QRS 102, QTc 403.  4. Cardiac catheterization performed on July 03, 2008 that revealed 50%      stenosis in left anterior descending, 90% stenosis of distal tip of      the left anterior descending, 90% stenosis of the proximal      circumflex with total occlusion of the mid circumflex artery, 90%  mid and 95% distal stenosis in the right coronary artery and 90%      stenosis in the posterior lateral branch of the right coronary      artery with mid inferior wall hypokinesis and ejection fraction of      60%.  Successful percutaneous coronary intervention of lesion in      the large posterior lateral branch of circumflex artery with DES.      Successful percutaneous coronary intervention of tandem lesions in      the mid and distal right coronary artery with nonoverlapping BMS.  5. EKG performed on July 03, 2008 showing normal sinus rhythm at a rate      of 69, T-wave inversion in lead III and aVF, T-wave flattening in      lead II.  Otherwise, no significant ST - T-wave changes, no      significant Q-waves, minimal left axis deviation, no evidence of      hypertrophy, PR 160, QRS 102, QTc 417.  6. Cardiac catheterization completed on July 04, 2008 with successful  percutaneous coronary intervention of obtuse marginal branch of      left circumflex with TAXUS Liberte DES.  Also successful      percutaneous coronary intervention using XIENCE DES to apical      portion of left anterior descending.  7. EKG performed on July 04, 2005, showing normal sinus rhythm at a      rate of 68, T-wave inversion in leads III and aVF and T-wave      flattening in lead II, left axis deviation, incomplete right bundle-      branch block (old).  Otherwise, no acute ST - T-wave changes, no      significant Q-waves, PR 156, QRS 98, QTc 423.  8. EKG completed on July 05, 2008, showing normal sinus rhythm at rate      of 80, no significant changes from prior tracing completed on July 04, 2008.   HISTORY OF PRESENT ILLNESS:  Nicholas [name] is a 50 year old Caucasian male  with no known history of coronary artery disease, but risk factors  including hyperlipidemia, ongoing tobacco abuse, and CAD equivalent of  diabetes mellitus who was in his usual state of health until June 30, 2008 when he had sudden  bilateral arm pain, 7/10, described as dull,  aching sensation, associated with indigestion-like symptoms at the back  of his throat and neck.  The patient also reports associated shortness  of breath.  This lasted about 30 minutes and resolved spontaneously.  Later while he was driving home.  He had recurrent symptoms and  presented to [facility].  There he was given nitro paste and  morphine with complete relief of symptoms.  He was transferred to [facility] for further eval and at the time of his evaluation, he was  painfree and enzymes were negative at that time.   HOSPITAL COURSE:  The patient was admitted and underwent procedures as  described above.  He tolerated them well without significant  complications.   The patient was noted to have elevated cardiac enzymes on July 01, 2008  and as it was the weekend, was scheduled for diagnostic cardiac  catheterization on Monday, July 03, 2008 (see results above).  Due to the  patient's significant disease and need for multiple PCIs, the patient  was scheduled for repeat cardiac catheterization with staged PCI on July 04, 2008 (see results above).  While inpatient, he received tobacco  cessation counseling.  He also completed cardiac rehab phase 1.  The  patient was assessed by Dr. Excell Seltzer, think stable for discharge on July 05, 2008.  The patient will continue on full-dose aspirin, Plavix 75 mg,  low-dose beta-blocker, and statin as well as a change of his PPI to  Protonix.  The patient has been given limited number of benzodiazepines  for ongoing anxiety to be followed up by the patient and his primary  care physician.  Per Dr. Earmon Phoenix assessment, the patient should begin  an ACE inhibitor, however, the patient's blood pressure seems to later  tolerate the addition of this medication and the patient has tend about  taking more medications at this time.  This issue should be followed up  at his scheduled visit with  [facility] with Dr. Diona Browner on July 26, 2008.  At the time of discharge, the patient will be given his new  medication list, prescriptions, followup instructions, and postcath  instructions.  All questions or concerns will be  addressed at that time.   DISCHARGE LABS:  WBC 8.9, HGB 13.2, HCT 38.7, PLT count 132.  Sodium  140, potassium 4.1, chloride 105, CO2 29, BUN 8, creatinine 0.79,  glucose 115, calcium 9.0, glucose ranged from 79 to 344 while the  patient was in the hospital.   FOLLOWUP PLANS AND APPOINTMENTS:  Please see hospital course.   DISCHARGE MEDICATIONS:  1. Enteric-coated aspirin 325 mg p.o. daily.  2. Protonix 40 mg p.o. daily.  3. Plavix 75 mg p.o. daily.  4. Metoprolol 25 mg p.o. b.i.d.  5. Simvastatin 40 mg p.o. daily.  6. Valium 5 mg up to q.12 h. p.r.n. for anxiety.  7. Nitroglycerin 0.4 mg p.r.n. for chest pain.   Duration of discharge encountered including physician time was 45  minutes.      Jarrett Ables, [facility]      Veverly Fells. Excell Seltzer, MD  Electronically Signed    MS/MEDQ  D:  07/05/2008  T:  07/06/2008  Job:  [id number]   cc:   Dr. Colin Ina D. Excell Seltzer, MD  Noralyn Pick. Eden Emms, MD, [facility]  Jonelle Sidle, MD

## 2010-06-11 NOTE — Assessment & Plan Note (Signed)
[facility]                       [place] CARDIOLOGY OFFICE NOTE   NAME:Nicholas [name], Nicholas [name]                       MRN:          [id number]  DATE:10/26/2006                            DOB:          11-11-1960    IDENTIFICATION:  The patient is a 50 year old gentleman who is followed  by Maurine Cane, previously by Dionicio Stall, has a history of atypical  chest pain, normal Myoview.  He was last seen in cardiology clinic back  in July.   The patient had a bad weekend this weekend with chest pain on and off  all weekend.  Occurs without activity or with activity.  No change.  Comes and goes.  Last anywhere from 5 minutes to an hour.  Starts, he  describes, in his left lateral chest and radiates to the left  parasternal area, accompanied by some heart racing.  He did not take his  pulse.   The patient was seen by Dr. Phillips Odor today and sent here for further  evaluation, currently not having any pain.   CURRENT MEDICATIONS:  1. Prevacid daily.  2. Coenzyme Q-10.   The patient could not tolerate statin or Tricor.   Still smoking about a pack a day.   PHYSICAL EXAMINATION:  The patient is in no distress.  His blood pressure is 110/80, pulse is 99 and regular, weight 183.  His lungs are clear, no wheezes or rales.  CARDIAC EXAM:  Regular rate and rhythm, S1, S2, no S3, no murmurs.  ABDOMEN:  Benign.  EXTREMITIES:  No edema.   IMPRESSION:  1. Chest pain, atypical.  I have asked him to try Norvasc 2.5 daily.      Follow up with Dr. Eden Emms in 4-6 weeks.  2. Palpitations.  Dr. Phillips Odor asked him to get an event monitor.  Will      go ahead and set him up for one.  3. Dyslipidemia.  He will need to discuss with Maurine Cane on return.  4. Tobacco.  Again, still smoking a pack per day and counseled him on      stopping for less than 10 minutes.     Pricilla Riffle, MD, [facility]  Electronically Signed    PVR/MedQ  DD: 10/26/2006  DT: 10/26/2006  Job #:  [id number]   cc:   Corrie Mckusick, M.D.

## 2010-06-11 NOTE — Assessment & Plan Note (Signed)
[facility]                       [place] CARDIOLOGY OFFICE NOTE   NAME:[name], Nicholas MATH                       MRN:          [id number]  DATE:12/04/2006                            DOB:          [birth year]    Nicholas [name] returns today for followup.   He is 50 years old.  He has hypertension and a smoker.  He has had  apical chest pain in the past with a normal Myoview.  He has  hypertension and is on low dose Norvasc.  He has had frequent  palpitations.  I reviewed his PDS Heart Monitor.  He had multiple  transmissions and over 20 pages of records.  There was no significant  arrhythmia.  He was primarily in a sinus rhythm.  I went over this with  Jake Shark at length and explained to him that his palpitations may be  muscular in nature.  There is no evidence of significant heart problems.   We then had a discussion for less than 10 minutes about smoking  cessation.  He has cut back from 2 packs a day to less than a 1/2 a pack  a day.  He is not interested in Chantix or Wellbutrin.  He will continue  to try and cut back and has a quit date scheduled for December.  Other  than this he has been compliant with his medicines.  He has tried to  limit his salt intake.  He has been taking Norvasc 2.5 mg a day.  His  last Myoview was normal in May 2007, with an EF of 57%.  His review of  systems is remarkable for continued mild reflux which he takes Prevacid  for.   REVIEW OF SYSTEMS:  Otherwise negative.   CURRENT MEDICATIONS:  1. Prevacid 20 mg a day.  2. Coenzyme-Q.  3. Norvasc 2.5 a day.   PHYSICAL EXAMINATION:  GENERAL:  He is a healthy-appearing young white  male in no distress.  VITAL SIGNS:  Weight is 186, blood pressure is 110/78, pulse 86 and  regular, respiratory rate 12.  HEENT:  Unremarkable.  NECK:  Carotids normal without bruit.  No lymphadenopathy, thyromegaly,  or JVP elevation.  LUNGS:  Clear with good diaphragmatic motion.  No  wheezing.  HEART:  S1 S2 with normal heart sounds.  PMI normal.  ABDOMEN:  Benign.  Bowel sounds positive.  No AAA.  No  hepatosplenomegaly, hepatojugular reflux.  No tenderness.  No bruits.  EXTREMITIES:  Distal pulses are intact.  No edema.  NEUROLOGIC:  Nonfocal.  No muscular weakness.  SKIN:  Warm and dry.  He has a tattoo on the right upper extremity.   IMPRESSION:  1. Previous atypical chest pain, normal Myoview.  Continue aspirin      therapy.  2. Palpitations.  No evidence of arrhythmia by transtelephonic      arrhythmia report.  Reassurance given.  3. Hypertension.  Continue Norvasc 2.5 mg a day, low salt diet.  4. Smoking cessation.  Counseled for less than 10 minutes.  Continue      to try to abstain.  Quit date set  for December.   I will see him back in 6 months.     Noralyn Pick. Eden Emms, MD, [facility]  Electronically Signed    PCN/MedQ  DD: 12/04/2006  DT: 12/04/2006  Job #: [id number]

## 2010-06-11 NOTE — Assessment & Plan Note (Signed)
[facility]                       [place] CARDIOLOGY OFFICE NOTE   NAME:Mccaslin, Nicholas [name]                       MRN:          [id number]  DATE:07/01/2007                            DOB:          1960/05/13    [name] returns today for followup.  He has had atypical chest pain in  the past with a nonischemic Myoview study done August 03, 2006.  He  continues to smoke.  I talked to him at length counseling for less than  10 minutes regarding this.  He is willing to try Wellbutrin this time.  He went from two packs a day to a pack a day.  He has had an x-ray in  the last year and there is been no evidence of premature lung disease or  cancer.  The patient continues to have occasional palpitations.  They  are benign.  We have had an event monitor on him and he is always in  sinus rhythm.   REVIEW OF SYSTEMS:  His review of systems is remarkable for some  fatigue.  He was asking for vitamin for more energy.  I told him I would  check his thyroid and testosterone level, but there was no Magic pill  for him to take.  Otherwise his blood pressure and sugar have been under  reasonable control.   CURRENT MEDICATIONS:  1. Prevacid 30 a day.  2. Coenzyme Q.  3. Norvasc 2.5 mg a day.  4. Metformin 500 mg.  5. Wellbutrin to be started for smoking cessation.   PHYSICAL EXAMINATION:  GENERAL:  His exam is remarkable for a middle-  aged white male in no distress.  He has nicotine on his breath.  VITAL SIGNS:  Weight is 179, blood pressure is 116/71, pulse 90 and  regular, afebrile.  HEENT:  Unremarkable.  NECK:  Carotids are without bruit.  No lymphadenopathy, thyromegaly, or  JVP elevation.  LUNGS:  Clear, good diaphragmatic motion.  No wheezing.  CARDIAC:  S1 and S2, normal heart sounds.  PMI normal.  ABDOMEN:  Benign bowel sounds positive.  No bruit.  No tenderness.  No  AAA.  No hepatosplenomegaly or GE reflux.  EXTREMITIES:  Pulses intact.  No edema.  NEURO:  Nonfocal.  SKIN:  Warm and dry.  MUSCULOSKELETAL:  No muscular distress.   IMPRESSION:  1. Chest pain, nonischemic Myoview, atypical.  Continues to have a      little bit of pain under his left ribs, probably musculoskeletal,      p.r.n. anti-inflammatories.  2. Palpitations benign.  No indication for beta-blocker.  Continue to      monitor.  3. Smoking.  Start Wellbutrin.  Continue to try to abstain.  4. Hypertension currently well-controlled.  Continue low dose Norvasc.  5. Diabetes.  Follow up with Dr. Phillips Odor.  He is only on metformin 500      a day.  He is a fairly thin individual.  Continue diet therapy.      Hemoglobin A1c quarterly.     Noralyn Pick. Eden Emms, MD, [facility]  Electronically Signed    PCN/MedQ  DD:  07/01/2007  DT: 07/01/2007  Job #: [id number]

## 2010-06-11 NOTE — Assessment & Plan Note (Signed)
NAME:  [name], [name]                 CHART#:  [id number]   DATE:  09/15/2006                       DOB:  1960-03-30   Dictated for Dr. Jonathon Bellows.   REFERRED BY:  Dr. Corrie Mckusick.   REASON FOR CONSULTATION:  Left-sided chest/abdominal pain.   HISTORY OF PRESENT ILLNESS:  Nicholas [name] is a 50 year old Caucasian gentleman  with a six-month history of left-sided chest/abdominal pain, who  presents for further evaluation, at the request of Dr. Assunta Found.  The patient already underwent a cardiac evaluation by Dr. Theron Arista C.  Nishan.  He reports two stress tests which were negative, the most  recent one done three months ago.  He states he has been told that his  symptoms are not related to his heart.  He underwent a chest CT at  [facility] on July 14, 2006, which revealed a wire that was  present at the apex of the right lung, which was known.  There was very  minimal interstitial lung disease.  Nothing really there to explain his  pain.  He had an abdominal ultrasound on August 06, 2006, which revealed a  fatty infiltration of the liver, but otherwise unremarkable.  The  patient denies any dyspnea on exertion or shortness of breath.  He is a  chronic smoker and currently smokes one pack per day.  He has chronic  gastroesophageal reflux disease, well controlled on Prilosec.  He denies  any dysphagia or odynophagia.  His bowel movements are regular.  No  melena or rectal bleeding.  He denies any weight loss.  If he holds his  left side, this seems to help his pain.  Nothing else seems to aggravate  it.  The pain begins in the left side, around the lower rib cage margin  and often radiates to the epigastrium and sometimes up into the chest.  He noted the other day he stopped his simvastatin and the Tri-Chlor, and  for three days he was pain-free, but the pain resumed when he resumed  his medication.  He has never had an EGD or a colonoscopy.   CURRENT MEDICATIONS:  1. Aspirin 325  mg daily.  2. Prilosec 20 mg daily.  3. Simvastatin 40 mg q.p.m.  4. Tri-Chlor 140 mg, 1/2 tab in the evening.   ALLERGIES:  No known drug allergies.   PAST MEDICAL HISTORY:  1. Hyperlipidemia.  2. Gastroesophageal reflux disease.   PAST SURGICAL HISTORY:  1. He had a chest tube placed for pneumothorax in the left lung about      15 years ago.  2. 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   FAMILY HISTORY:  Mother deceased at age 21, due to kidney failure.  Father deceased at age 73, of a myocardial infarction.  He has a brother  who had peptic ulcer disease.  One brother died of cancer which started  out on his knee.  He was dead in three months from diagnosis.   SOCIAL HISTORY:  He is married.  He has two biological children.  He is  self-employed doing maintenance.  He smokes one pack of cigarettes a day  and has smoked heavier in the past.  He has smoked  basically all of his  life.  He denies any alcohol use.   REVIEW OF SYSTEMS:  See the HPI for GI, CONSTITUTIONAL AND  CARDIOPULMONARY.  GENITOURINARY:  Denies any dysuria or hematuria.   PHYSICAL EXAMINATION:  VITAL SIGNS:  Weight 186 pounds, height 5 feet 9  inches, temperature 99 degrees, blood pressure 140/90, pulse 88.  GENERAL:  A pleasant well-developed and well-nourished Caucasian male,  in no acute distress.  SKIN:  Warm and dry, no jaundice.  HEENT:  Sclerae anicteric.  Oropharyngeal mucosa moist and pink.  No  lesions, erythema or exudate.  No lymphadenopathy or thyromegaly.  CHEST:  Lungs clear to auscultation.  HEART:  A regular rate and rhythm.  Normal S1 and S2.  No murmurs, rubs  or gallops.  ABDOMEN:  Positive bowel sounds.  The abdomen is soft.  He has minimal  left upper quadrant tenderness to deep palpation.  No rebound tenderness  or guarding.  No flank tenderness or CVA tenderness.  No abdominal  bruits or herniae.  No hepatosplenomegaly or masses.   When asked to  point to the location of the pain, he points to his left mid-chest below  the axillae.  EXTREMITIES:  Lower extremities:  No edema.   IMPRESSION:  Nicholas [name] is a 50 year old gentleman with a six-month history of  left-sided abdominal pain which is located in the left lower rib cage  margin/left upper quadrant region and radiates upward and into the chest  and the epigastrium.  It is not associated with any other  gastrointestinal symptoms.  He does have chronic gastroesophageal reflux  disease, but states it is well-controlled.  He describes the pain as a  bubbling-type pain and it hurts more as the bubbles pop.  He has not  taken any pain medication for this pain.  Interestingly the pain goes  away the more active he is.  He also has noted no pain while he held his  hyperlipidemia medications, which may be completely coincidental.  The  etiology of the pain is not clear at this time.   RECOMMENDATIONS/PLAN:  1. A CT of the abdomen and pelvis to further evaluate the abdominal      portion of his pain.  2. Amylase, lipase, CMET and CBC.  3. Switch from Prilosec to Prevacid 30 mg daily, #14 samples provided.  4. Further recommendations to follow.   I would like to thank Dr. Assunta Found for allowing [place] to take part in  the care of this patient.       [name], P.A.  Electronically Signed     R. Roetta Sessions, M.D.  Electronically Signed    LL/MEDQ  D:  09/15/2006  T:  09/16/2006  Job:  [id number]   cc:   Corrie Mckusick, M.D.

## 2010-06-11 NOTE — Cardiovascular Report (Signed)
NAME:  Nicholas [name], Nicholas [name] NO.:  [id number]   MEDICAL RECORD NO.:  [id number]          PATIENT TYPE:  INP   LOCATION:  2507                         FACILITY:  [facility]   PHYSICIAN:  Veverly Fells. Excell Seltzer, MD  DATE OF BIRTH:  02/09/1960   DATE OF PROCEDURE:  07/04/2008  DATE OF DISCHARGE:                            CARDIAC CATHETERIZATION   PROCEDURE:  Percutaneous transluminal coronary angioplasty and stenting  of the left circumflex, stenting of the left anterior descending, and  Angio-Seal of the left femoral artery.   PROCEDURE INDICATIONS:  Nicholas [name] is a 50 year old gentleman who  presented with an acute coronary syndrome.  He ruled in for myocardial  infarction and underwent PCI by Dr. Juanda Chance yesterday.  He had complex  lesions in his right coronary artery that were treated with multiple  stents.  He presents back today for staged PCI of the left circumflex.  He has a small, diffusely diseased mid left circumflex extending into an  OM branch.  There is an 80% stenosis just at the bifurcation.  The AV  groove circumflex is totally occluded beyond the OM.  Plans are to treat  the circumflex into the OM with a small drug-eluting stent.  There also  appears to be significant stenosis at the apical LAD that will be  assessed.   Risks and indication of the procedure were reviewed with the patient.  Informed consent was obtained.  The left groin was prepped, draped, and  anesthetized with 1% lidocaine.  Using modified Seldinger technique, a 6-  French sheath was placed in the left femoral artery with a front wall  puncture.  A 6-French XB 3.5-cm guide catheter was used.  Angiomax was  used for anticoagulation.  A Cougar guidewire was passed into the left  circumflex OM branch.  After therapeutic ACT was achieved, the lesion  was predilated with a 2.0 x 20-mm apex balloon which was taken to 6  atmospheres on a single inflation.  This improved the percent stenosis  and the  balloon was used to assess the lesion length.  I ultimately  treated the lesion with a 2.25 x 24-mm Taxus Atom Liberte stent.  The  stent was deployed at 12 atmospheres.  The stent appeared well expanded.  I elected to post dilate the proximal edge of the stent which is where  the tightest lesion was with the 2.25 x 12-mm Quantum apex balloon which  was taken to 16 atmospheres.  At the completion of the procedure, there  was an excellent angiographic result with good stent expansion and TIMI  III flow in the vessel.  Further views were taken of the LAD.  After  intracoronary nitroglycerin, the LAD in the apical portion clearly had  severe stenosis.  There was an 80% lesion in the apical portion, but the  LAD then went on to wrap around the apex.  I elected to primarily stent  the vessel with a 2.5 x 18-mm Xience stent which was carefully  positioned and deployed at 12 atmospheres.  Following stenting, there  was improvement, but there was a significant  waist in the midportion of  the stent.  The stent was then postdilated with a 2.75 x 15-mm Quantum  apex balloon which was taken to 18 atmospheres and greatly improved  stent expansion in the midportion over the lesion.  At completion of the  procedure, there was an excellent result with 0% residual stenosis and  TIMI III flow.  The patient tolerated the entire procedure well.  The  femoral arteriotomy was closed with an Angio-Seal device.      Veverly Fells. Excell Seltzer, MD  Electronically Signed     MDC/MEDQ  D:  07/04/2008  T:  07/05/2008  Job:  [id number]

## 2010-06-11 NOTE — H&P (Signed)
NAME:  Nicholas [name], Nicholas [name] NO.:  [id number]   MEDICAL RECORD NO.:  [id number]          PATIENT TYPE:  INP   LOCATION:  3735                         FACILITY:  [facility]   PHYSICIAN:  Jesse Sans. Wall, MD, FACCDATE OF BIRTH:  1960/04/06   DATE OF ADMISSION:  06/30/2008  DATE OF DISCHARGE:                              HISTORY & PHYSICAL   PRIMARY CARDIOLOGIST:  Previously, Dr. Eden Emms in [place].   PRIMARY CARE [place] Limes:  Dr. Phillips Odor.   PROFILE:  A 50 year old Caucasian male with prior history of atypical  chest pain and negative Myoview who presents with recurrent arm and  indigestion like symptoms.   PROBLEMS:  1. Atypical chest pain.      a.     Negative Myoview, Jun 03, 2005.      b.     Negative Myoview, August 03, 2006.  2. Hypertriglyceridemia/hyperlipidemia.  3. Tobacco abuse.  4. Gastroesophageal reflux disease.  5. Palpitations.  6. Diabetes mellitus.   HISTORY OF PRESENT ILLNESS:  A 50 year old married Caucasian male with  the above problem list.  He was in his usual state of health until today  when he was working at a landscaper performing fairly heavy exertion,  had sudden bilateral arms 7/10 dull aching sensation associated with  indigestion like symptoms in the back of his throat and neck, as well as  associated shortness of breath.  He sat and rested for about 30 minutes  and symptoms resolved.  Then, drove home and while there, had recurrent  symptoms who presented to [facility].  While there, he used  nitro paste and morphine with complete relief of symptoms.  He is now  pain free and enzymes are negative.  He was transferred here for further  evaluation.   ALLERGIES:  No known drug allergies.   HOME MEDICATIONS:  Glimepiride q. a.m., Actos nightly, Prevacid 30 mg  daily, Welchol unknown dose.   FAMILY HISTORY:  Mother died of diabetes at 48.  Father died of an MI in  his 44s.  He has 4 brothers and 4 sisters.  There is a history of  diabetes in his siblings.   SOCIAL HISTORY:  Lives in [name] with his wife.  He works as a  Administrator.  He has a 60-pack-year history of tobacco abuse, smoking two  packs a day.  Denies alcohol and drugs,  and is not routinely  exercising.   REVIEW OF SYSTEMS:  Positive for chest pain and dyspnea as outlined in  the HPI.  He also reports weakness, decreased energy over the past  couple of days.  Otherwise, all systems reviewed, negative.   PHYSICAL EXAMINATION:  VITAL SIGNS:  Afebrile, heart rate 78,  respirations 18, blood pressure 87/50, pulse ox 98% on room air.  GENERAL:  Pleasant white male in no acute distress.  Awake, alert, and  oriented x3, normal affect.  HEENT:  Normal.  Nares grossly intact, nonfocal.  SKIN:  Warm and dry without lesions or masses.  MUSCULOSKELETAL:  Grossly normal without deformity or fusion.  NECK:  Supple without bruits or JVD.  LUNGS:  Respirations are  regular and unlabored, otherwise clear to  auscultation.  CARDIAC:  Regular S1 and S2.  No S3, S4.  No murmurs.  ABDOMEN:  Round, soft, nontender, nondistended.  Bowel sounds present.  Extremities:  Lower extremities warm, dry, pink.  No clubbing, cyanosis,  or edema.  Dorsalis pedis, posterior tibial pulses 2+ and equal  bilaterally.   Chest x-ray shows foreign body over the right lung apex was unchanged.  Otherwise, no acute disease.  EKG shows sinus rhythm, rate of 76,  incomplete right bundle branch block, which is old.  Hemoglobin 15.1,  hematocrit 43.4, WBC 59.4, and platelets 145.  Sodium 137, potassium  3.6, chloride 103, CO2 of 24, BUN 11, creatinine 0.77, glucose 169.  Cardiac markers negative x2, calcium 9.2.   ASSESSMENT:  1. Arm pain/throat pain, typical and atypical symptoms.  Plan to admit      and cycle cardiac markers.  He has history of negative Myoview x2,      the last occurring approximately 2 years ago.  He has never been      capped.  We will plan cardiac  catheterization on this      admission/Monday.  Add aspirin, statin, and heparin.  We will have      to hold off on nitrate and beta-blocker currently his blood      pressure is a bit low.  We will hydrate him.  2. Hypertension - hydrate.  3. Tobacco abuse, smoking cessation strongly advised.  4. Hyperlipidemia while on statin.  5. Gastroesophageal reflux disease.  Continue PPI.  6. Diabetes mellitus.  We will continue his home medications once he      is able to give [place] the doses.      Nicholas [name], ANP      Jesse Sans. Daleen Squibb, MD, [facility]  Electronically Signed    CB/MEDQ  D:  06/30/2008  T:  07/01/2008  Job:  [id number]

## 2010-06-11 NOTE — Assessment & Plan Note (Signed)
[facility]                       [place] [facility] OFFICE NOTE   NAME:Nicholas [name], Nicholas [name]                       MRN:          [id number]  DATE:07/28/2006                            DOB:          11/05/1960    Mr. Ardito is seen today for follow-up.  I have not seen him before.  He  has previously been seen by Dr. Dorethea Clan.  Actually, I last saw the  patient February 17, 2006.   He has had previous atypical chest pain.  He had a normal Myoview in May  2007.   He has cut back from his smoking to one pack a day.   He continues to have atypical symptoms.  He describes his atypical pain  under the left nipple area.  It is intermittent.  It can be on and off  all day long.  It was particularly bad the last couple of days.  He saw  Dr. Renette Butters.  Apparently he had a chest x-ray and  CT scan.  I do not  know these results.   The patient basically feels if he squeezes his chest, he feels better.   There has been no fever or other evidence of myopericarditis.  He has  not had associated diaphoresis or shortness of breath.  The pain has  been progressive.  It is sometimes actually relieved with ambulation.   There has been no coughing, no fever, and no evidence of active lung  process.   REVIEW OF SYSTEMS:  Otherwise negative.   CURRENT MEDICATIONS:  1. An aspirin a day.  2. Prilosec 20 mg b.i.d.  3. Simvastatin 40 mg a day.  4. Tricor 145 mg a day.   His exam is remarkable for a thin white male in no distress.  Affect is  appropriate.  Weight is 184, respiratory rate is 14, blood pressure is 110/72, pulse  is 90 and regular.  HEENT:  Normal.  NECK:  Supple.  There is no thyromegaly, no lymphadenopathy, no carotid  bruits, no JVP elevation.  LUNGS:  Clear.  There is no wheezing.  There is normal diaphragmatic  motion.  There is no pleuritic rub.  There is an S1, S2, with normal heart sounds.  PMI is normal.  ABDOMEN:  Benign.  Bowel sounds positive.   There is no AAA and no  tenderness.  No hepatosplenomegaly or hepatojugular reflux.  Distal pulses are intact, no edema.  NEUROLOGIC:  Nonfocal.  SKIN:  Warm and dry.  There is no muscular weakness.   I reviewed his Myoview from Jun 03, 2005.  It was indeed normal with an  EF of 57%.   IMPRESSION:  1. Atypical chest pain.  Need to get the results of the CT scan and      chest x-ray from Dr. Gwendalyn Ege office.  He is at risk for having a      lung lesion given his continued smoking.  2. Atypical pain with risk factors including continued tobacco abuse.      Follow up stress Myoview.  Add long-acting Imdur 15 mg a day to see  if this helps.  3. Possible esophageal spasm or reflux.  Continue Prilosec 20 mg      b.i.d.  4. Hypertriglyceridemia and hypercholesterolemia.  Continue      simvastatin.  The patient has been started on Tricor 145 mg at      night.  He will continue these two medicines.  We will follow up      his lipid and triglyceride panel in 6 months.  5. Smoking cessation.  Spent less than 10 minutes counseling him about      this.  Currently he does not want to spend any more money on      medications.  He did not tolerate Chantix due to agitation.  We      will continue to follow him, and he will try to cut back on his      own.  I would not substitute nicotine patches at this time.     Noralyn Pick. Eden Emms, MD, [facility]  Electronically Signed    PCN/MedQ  DD: 07/28/2006  DT: 07/29/2006  Job #: [id number]

## 2010-06-14 NOTE — Assessment & Plan Note (Signed)
[facility]                       [place] [facility] OFFICE NOTE   NAME:Nicholas [name], Nicholas [name]                       MRN:          [id number]  DATE:02/17/2006                            DOB:          03/03/1960    [name] is seen today in follow-up.  He has seen Dr. Dorethea [name] in the  past for somewhat atypical chest pain.  He had a Myoview on Jun 03, 2005,  which was normal with an EF of 57%.   The patient has hypercholesterolemia and was placed on simvastatin.  He  has not had a recent cholesterol or liver check.   He continues to have somewhat atypical chest pain.  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  This may be exacerbated by his horseback riding.   REVIEW OF SYSTEMS:  Remarkable for some reflux.  He has insurance now  and has asked for a refill on his Prilosec.  He also had some chronic  lower back pain.  He continues to smoke.  He used to smoke 2 packs a day  and is now under a pack a day.  He has a prescription for Chantix at  home but has not filled it.  We talked about this for awhile and I  encouraged him, since he is below a pack a day, to start the Chantix and  try to quit totally.   In regard to his other medications, he is on an aspirin a day.  He  previously had been taking Prilosec 20 mg b.i.d. and simvastatin 40 mg a  day.   PHYSICAL EXAMINATION:  VITAL SIGNS:  Blood pressure is 120/80, pulse is  70 and regular.  HEENT:  Normal.  NECK:  Carotids are normal without bruit.  LUNGS:  Clear.  CARDIAC:  There is an S1, S2 with normal heart sounds.  ABDOMEN:  Benign.  LOWER EXTREMITIES:  Intact pulses, no edema.   His EKG today is essentially normal with an incomplete right bundle  branch block.   IMPRESSION:  Stable previous and current atypical chest pain, normal  Myoview May 8.  Follow-up treadmill in about a year.  I continue to  encourage smoking cessation.  He  has a prescription for Chantix.  He  will continue on proton pump inhibitors for his reflux.   He will come by our office to check a lipid and liver profile since he  is on simvastatin.  Further recommendations regarding the dose of this  will be based on his HDL and LDL profiles.     Nicholas Pick. Eden Emms, MD, [facility]  Electronically Signed    PCN/MedQ  DD: 02/17/2006  DT: 02/17/2006  Job #: [id number]   cc:   Nicholas Rear. Sherwood Gambler, MD

## 2010-06-18 ENCOUNTER — Other Ambulatory Visit: Payer: Self-pay | Admitting: Cardiology

## 2010-06-25 ENCOUNTER — Other Ambulatory Visit: Payer: Self-pay

## 2010-06-25 DIAGNOSIS — E785 Hyperlipidemia, unspecified: Secondary | ICD-10-CM

## 2010-06-26 ENCOUNTER — Other Ambulatory Visit: Payer: Self-pay | Admitting: Cardiology

## 2010-06-27 ENCOUNTER — Ambulatory Visit (INDEPENDENT_AMBULATORY_CARE_PROVIDER_SITE_OTHER): Payer: BC Managed Care – PPO | Admitting: Cardiology

## 2010-06-27 ENCOUNTER — Encounter: Payer: Self-pay | Admitting: Cardiology

## 2010-06-27 VITALS — BP 111/67 | HR 92 | Ht 69.0 in | Wt 174.0 lb

## 2010-06-27 DIAGNOSIS — E782 Mixed hyperlipidemia: Secondary | ICD-10-CM

## 2010-06-27 DIAGNOSIS — I2510 Atherosclerotic heart disease of native coronary artery without angina pectoris: Secondary | ICD-10-CM

## 2010-06-27 DIAGNOSIS — F17200 Nicotine dependence, unspecified, uncomplicated: Secondary | ICD-10-CM

## 2010-06-27 LAB — LIPID PANEL
Cholesterol: 250 mg/dL — ABNORMAL HIGH (ref 0–200)
HDL: 38 mg/dL — ABNORMAL LOW (ref >39)
LDL Cholesterol: 140 mg/dL — ABNORMAL HIGH (ref 0–99)
Total CHOL/HDL Ratio: 6.6 Ratio
Triglycerides: 359 mg/dL — ABNORMAL HIGH (ref <150)
VLDL: 72 mg/dL — ABNORMAL HIGH (ref 0–40)

## 2010-06-27 LAB — HEPATIC FUNCTION PANEL
ALT: 17 U/L (ref 0–53)
Albumin: 4.5 g/dL (ref 3.5–5.2)
Indirect Bilirubin: 0.2 mg/dL (ref 0.0–0.9)
Total Protein: 7 g/dL (ref 6.0–8.3)

## 2010-06-27 NOTE — Patient Instructions (Signed)
Your physician recommends that you return for lab work in: 6 months, just before next office visit  Your physician recommends that you schedule a follow-up appointment in: 6 months

## 2010-06-27 NOTE — Assessment & Plan Note (Signed)
We continue to discuss complete smoking cessation. 

## 2010-06-27 NOTE — Assessment & Plan Note (Signed)
Symptomatically stable on medical therapy. Myoview from earlier in the year was overall reassuring. Continue diet, exercise, observation.

## 2010-06-27 NOTE — Progress Notes (Signed)
Clinical Summary Nicholas [name] is a 50 y.o.male presenting for followup. He was seen back in February of this year. He was referred for a followup Cardiolite around that time, overall reassuring and outlined below.  He reports that he's been doing fairly well since his last visit. He is undergoing some analgesic injections for left costal/thoracic nerve discomfort. He states this has been reasonably effective. No reported angina or nitroglycerin use. He has been trying to cut back cigarettes, has not completely stop smoking as yet. Reports compliance with his statin, also try to cut back on red meat.  Followup lab work from May 30 showed cholesterol 250, triglycerides 359, HDL 38, LDL 140. This actually represents an improvement compared to previous labs in January. AST and ALT were normal.  No Known Allergies  Current outpatient prescriptions:aspirin 325 MG EC tablet, Take 325 mg by mouth daily.  , Disp: , Rfl: ;  clopidogrel (PLAVIX) 75 MG tablet, Take 75 mg by mouth daily.  , Disp: , Rfl: ;  glimepiride (AMARYL) 4 MG tablet, Take 4 mg by mouth daily before breakfast. , Disp: , Rfl: ;  metoprolol tartrate (LOPRESSOR) 25 MG tablet, TAKE (1) TABLET BY MOUTH ONCE DAILY., Disp: 30 tablet, Rfl: 3 nitroGLYCERIN (NITROSTAT) 0.4 MG SL tablet, Place 0.4 mg under the tongue. TAKE AS DIRECTED FOR CHEST PAIN , Disp: , Rfl: ;  ONGLYZA 5 MG TABS tablet, 5 mg daily. , Disp: , Rfl: ;  pantoprazole (PROTONIX) 40 MG tablet, Take 1 tablet by mouth daily. , Disp: , Rfl: ;  simvastatin (ZOCOR) 20 MG tablet, Take 20 mg by mouth at bedtime.  , Disp: , Rfl: ;  topiramate (TOPAMAX) 25 MG tablet, Take 25 mg by mouth daily. , Disp: , Rfl:  DISCONTD: gabapentin (NEURONTIN) 300 MG capsule, Take 300 mg by mouth 3 (three) times daily.  , Disp: , Rfl: ;  DISCONTD: pantoprazole sodium (PROTONIX) 40 mg/20 mL PACK, Place 40 mg into feeding tube daily at 12 noon. , Disp: , Rfl:   Past Medical History  Diagnosis Date  . Type 2 diabetes  mellitus   . Mixed hyperlipidemia   . Myocardial infarction   . GERD (gastroesophageal reflux disease)   . Helicobacter pylori (H. pylori)   . Colon polyp     Hyperplastic rectosigmoid polyp 9/08  . Hiatal hernia   . Coronary atherosclerosis of native coronary artery     DES to PLB, BMS to mid and distal RCA, DES to circumflex and distal LAD, 6/10    Past Surgical History  Procedure Date  . Collapsed lung     15 years ago  . 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 Cholecystectomy     Family History  Problem Relation Age of Onset  . Diabetes Mother   . Heart disease Father   . Cancer Brother     Social History Nicholas [name] reports that he has been smoking Cigarettes.  He has a 34 pack-year smoking history. He has never used smokeless tobacco. Nicholas [name] reports that he does not drink alcohol.  Review of Systems Improvement in left thoracic discomfort. No palpitations or unusual shortness of breath. Stable appetite. Other was negative.  Physical Examination Filed Vitals:   06/27/10 0829  BP: 111/67  Pulse: 92  Additional Exam: Comfortable in no acute distress.  HEENT: Conjuctivae and lids normal, oropharynx clear with moist mucosa.  Neck: Supple, no elevated JVP, no loud carotid bruits, no thyromegaly  or tenderness.  Lungs: Nonlabored breathing at rest. CTA without rales or wheezes.  Cor: PMI nondisplaced. RRR, normal S1/S2. No pathologic systolic murmurs. No S3 or rub.  Abd: Soft, NTND. No HSM. No bruits. Normoactive bowel sounds.  Skin: Warm and dry.  Musculoskeletal: No kyphosis.  Extremities: No pitting edema.  Neuropsychiatric: Alert and oriented x3, affect appropriate.   Studies Exercise Myoview 03/27/2010: Low risk exercise Myoview. No diagnostic ST-segment changes were noted, and no chest pain was reported. Perfusion imaging shows evidence of probable soft tissue attenuation affecting the inferolateral wall, otherwise no clear evidence of ischemia.  LVEF 63%.    Problem List and Plan

## 2010-06-27 NOTE — Assessment & Plan Note (Signed)
He reports compliance with statin therapy. We discussed diet. Followup fasting lipid profile and liver function tests for his next visit. If lipid numbers do not continue to improve to goal, we can further advance dosing.

## 2010-09-07 ENCOUNTER — Other Ambulatory Visit: Payer: Self-pay | Admitting: Cardiology

## 2010-11-07 LAB — POCT CARDIAC MARKERS
CKMB, poc: <1 — ABNORMAL LOW
Myoglobin, poc: 41
Troponin i, poc: <0.05

## 2010-11-09 LAB — HEPATIC FUNCTION PANEL
ALT: 26 U/L (ref 0–53)
Bilirubin, Direct: 0.1 mg/dL (ref 0.0–0.3)
Total Protein: 6.7 g/dL (ref 6.0–8.3)

## 2010-11-09 LAB — LIPID PANEL
LDL Cholesterol: 130 mg/dL — ABNORMAL HIGH (ref 0–99)
Triglycerides: 359 mg/dL — ABNORMAL HIGH (ref <150)

## 2010-11-13 ENCOUNTER — Encounter: Payer: Self-pay | Admitting: Cardiology

## 2010-11-13 ENCOUNTER — Ambulatory Visit (INDEPENDENT_AMBULATORY_CARE_PROVIDER_SITE_OTHER): Payer: BC Managed Care – PPO | Admitting: Cardiology

## 2010-11-13 DIAGNOSIS — R079 Chest pain, unspecified: Secondary | ICD-10-CM

## 2010-11-13 DIAGNOSIS — F17200 Nicotine dependence, unspecified, uncomplicated: Secondary | ICD-10-CM

## 2010-11-13 DIAGNOSIS — I2510 Atherosclerotic heart disease of native coronary artery without angina pectoris: Secondary | ICD-10-CM

## 2010-11-13 MED ORDER — TRAMADOL HCL 50 MG PO TABS: 50.0000 mg | ORAL_TABLET | Freq: Four times a day (QID) | ORAL | Status: DC | PRN

## 2010-11-13 NOTE — Progress Notes (Signed)
HPI:Nicholas [name] is a 50 y/o patient of Dr. Diona Browner we are seeing for ongoing assessment and treatment of recurrent chest pain in the setting of CAD with DES to PLB and BMS to mid and distal RCA, DES to Cx and distal LAD in 2012.  Marland Kitchen  He has had a cardiolite stress test 02/2010 which was negative for ischemia    He comes today with complaints of ongoing left sided chest pain which has been constant for 3 years.  He states there are times when the pain is very severe for 5-7 days leaving him sore thereafter. He describes this as heaviness and sharp.  He has been to several different specialists and had several different tests at the Dr. Lamar Blinks request..  Most recent diagnosis left costal/thorasic nerve pain with no exact etiology.  He wonders if this discomfort is from his heart as he is frustrated and has not found a way to control the pain. He denies associated shortness of breath, diaphoresis, weakness or dizziness. No Known Allergies  Current Outpatient Prescriptions  Medication Sig Dispense Refill  . aspirin 325 MG EC tablet Take 325 mg by mouth daily.        Marland Kitchen glimepiride (AMARYL) 4 MG tablet Take 4 mg by mouth daily before breakfast.       . metoprolol tartrate (LOPRESSOR) 25 MG tablet TAKE (1) TABLET BY MOUTH ONCE DAILY.  30 tablet  3  . nitroGLYCERIN (NITROSTAT) 0.4 MG SL tablet Place 0.4 mg under the tongue. TAKE AS DIRECTED FOR CHEST PAIN       . ONGLYZA 5 MG TABS tablet 5 mg daily.       . pantoprazole (PROTONIX) 40 MG tablet Take 1 tablet by mouth daily.       Marland Kitchen PLAVIX 75 MG tablet TAKE ONE TABLET BY MOUTH ONCE DAILY.  30 each  3  . simvastatin (ZOCOR) 20 MG tablet Take 20 mg by mouth at bedtime.        . topiramate (TOPAMAX) 25 MG tablet Take 25 mg by mouth daily.       . traMADol (ULTRAM) 50 MG tablet Take 1 tablet (50 mg total) by mouth every 6 (six) hours as needed for pain. For pain  90 tablet  0    Past Medical History  Diagnosis Date  . Type 2 diabetes mellitus   . Mixed  hyperlipidemia   . Myocardial infarction   . GERD (gastroesophageal reflux disease)   . Helicobacter pylori (H. pylori)   . Colon polyp     Hyperplastic rectosigmoid polyp 9/08  . Hiatal hernia   . Coronary atherosclerosis of native coronary artery     DES to PLB, BMS to mid and distal RCA, DES to circumflex and distal LAD, 6/10    Past Surgical History  Procedure Date  . Collapsed lung     15 years ago  . 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 Cholecystectomy     ZOX:WRUEAV of systems complete and found to be negative unless listed above PHYSICAL EXAM BP 125/77  Pulse 101  Resp 16  Ht 5\' 9"  (1.753 m)  Wt 77.565 kg (171 lb)  BMI 25.25 kg/m2  General: Well developed, well nourished, in no acute distress Head: Eyes PERRLA, No xanthomas.   Normal cephalic and atramatic  Lungs: Clear bilaterally to auscultation and percussion. Heart: HRRR S1 S2, without MRG.  Pulses are 2+ & equal.  No carotid bruit. No JVD.  No abdominal bruits. No femoral bruits. Abdomen: Bowel sounds are positive, abdomen soft and non-tender without masses or                  Hernia's noted. Msk:  Back normal, normal gait. Normal strength and tone for age. Pain with palpation of his left chest, and numbness with palpation of lateral left chest. Extremities: No clubbing, cyanosis or edema.  DP +1 Neuro: Alert and oriented X 3. Psych:  Good affect, responds appropriately  EKG: Sinus tachycardia with RBBB. Rate of 101 bpm.:  ASSESSMENT AND PLAN

## 2010-11-13 NOTE — Assessment & Plan Note (Signed)
EKG and most recent stress test are reassuring and discussed with the patient.  He is to continue risk management with tobacco cessation, low cholesterol diet.  Will see him in 6 months.

## 2010-11-13 NOTE — Assessment & Plan Note (Signed)
Cessation is strongly recommended. He verbalizes understanding. 

## 2010-11-13 NOTE — Assessment & Plan Note (Signed)
The pain appears to be musculoskeletal or neurologic in etiology. Unlikely cardiac in etiology as this is ongoing, constant with waxing and waning symptoms.  There is at some point numbness and at other times sharp, with soreness afterward. NTG does not relieve this.   I am referring him to [facility] clinic for assistance for chronic pain.  He is given Rx for tramadol for assistance in pain control, although uncertain if this will alleviate it.  He will follow up with Dr. Phillips Odor for continued medical management.

## 2010-12-25 ENCOUNTER — Ambulatory Visit: Payer: BC Managed Care – PPO | Admitting: Cardiology

## 2010-12-27 ENCOUNTER — Other Ambulatory Visit: Payer: Self-pay | Admitting: Cardiology

## 2011-01-03 ENCOUNTER — Telehealth: Payer: Self-pay | Admitting: Cardiology

## 2011-01-03 MED ORDER — CLOPIDOGREL BISULFATE 75 MG PO TABS: 75.0000 mg | ORAL_TABLET | Freq: Every day | ORAL | Status: DC

## 2011-01-03 NOTE — Telephone Encounter (Signed)
Pt wants to switch to generic plavix $100 difference/tmj

## 2011-01-17 ENCOUNTER — Other Ambulatory Visit: Payer: Self-pay | Admitting: Cardiology

## 2011-02-27 ENCOUNTER — Other Ambulatory Visit: Payer: Self-pay | Admitting: Cardiology

## 2011-02-28 ENCOUNTER — Other Ambulatory Visit: Payer: Self-pay

## 2011-02-28 MED ORDER — TRAMADOL HCL 50 MG PO TABS: 50.0000 mg | ORAL_TABLET | Freq: Four times a day (QID) | ORAL | Status: DC | PRN

## 2011-04-21 ENCOUNTER — Ambulatory Visit (INDEPENDENT_AMBULATORY_CARE_PROVIDER_SITE_OTHER): Payer: BC Managed Care – PPO | Admitting: Cardiology

## 2011-04-21 ENCOUNTER — Encounter: Payer: Self-pay | Admitting: Cardiology

## 2011-04-21 VITALS — BP 132/73 | HR 86 | Resp 18 | Ht 69.0 in | Wt 172.0 lb

## 2011-04-21 DIAGNOSIS — R079 Chest pain, unspecified: Secondary | ICD-10-CM

## 2011-04-21 DIAGNOSIS — F17200 Nicotine dependence, unspecified, uncomplicated: Secondary | ICD-10-CM

## 2011-04-21 DIAGNOSIS — I2510 Atherosclerotic heart disease of native coronary artery without angina pectoris: Secondary | ICD-10-CM

## 2011-04-21 NOTE — Progress Notes (Signed)
Clinical Summary Nicholas [name] is a 51 y.o.male presenting for followup. He was seen in October 2012. He continues to have problems with recurring, prolonged, atypical chest pain - mainly lower costal on the left to center. He states that it feels better when he applies pressure to the area with his fingers and thumb.  Exercise Myoview in 2/12 showed no diagnostic ST changes and soft tissue attenuation without clear ischemia, LVEF 63%. Followup ECG is reviewed below.  He reports compliance with his cardiac medications. Continues to smoke and has not been able to quit.  He has not had evaluation by the pain clinic in [place] as yet - recommended at the last visit.  No Known Allergies  Current Outpatient Prescriptions  Medication Sig Dispense Refill  . amoxicillin-clavulanate (AUGMENTIN) 875-125 MG per tablet       . aspirin 325 MG EC tablet Take 325 mg by mouth daily.        . clopidogrel (PLAVIX) 75 MG tablet Take 1 tablet (75 mg total) by mouth daily.  30 tablet  4  . gabapentin (NEURONTIN) 300 MG capsule       . glimepiride (AMARYL) 4 MG tablet Take 4 mg by mouth daily before breakfast.       . metoprolol tartrate (LOPRESSOR) 25 MG tablet TAKE (1) TABLET BY MOUTH ONCE DAILY.  30 tablet  3  . nitroGLYCERIN (NITROSTAT) 0.4 MG SL tablet Place 0.4 mg under the tongue. TAKE AS DIRECTED FOR CHEST PAIN       . ONGLYZA 5 MG TABS tablet 5 mg daily.       . pantoprazole (PROTONIX) 40 MG tablet Take 1 tablet by mouth daily.       . simvastatin (ZOCOR) 20 MG tablet Take 20 mg by mouth at bedtime.        . traMADol (ULTRAM) 50 MG tablet Take 1 tablet (50 mg total) by mouth every 6 (six) hours as needed for pain. For pain  90 tablet  0    Past Medical History  Diagnosis Date  . Type 2 diabetes mellitus   . Mixed hyperlipidemia   . Myocardial infarction   . GERD (gastroesophageal reflux disease)   . Helicobacter pylori (H. pylori)   . Colon polyp     Hyperplastic rectosigmoid polyp 9/08  .  Hiatal hernia   . Coronary atherosclerosis of native coronary artery     DES to PLB, BMS to mid and distal RCA, DES to circumflex and distal LAD, 6/10    Past Surgical History  Procedure Date  . Collapsed lung     15 years ago  . 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 Cholecystectomy     Family History  Problem Relation Age of Onset  . Diabetes Mother   . Heart disease Father   . Cancer Brother     Social History Nicholas [name] reports that he has been smoking Cigarettes.  He has a 34 pack-year smoking history. He has never used smokeless tobacco. Nicholas [name] reports that he does not drink alcohol.  Review of Systems No palpitations or syncope. Appetite stable. No reported bleeding problems. Otherwise negative except as outlined.  Physical Examination Filed Vitals:   04/21/11 1443  BP: 132/73  Pulse: 86  Resp: 18   Comfortable in no acute distress.  HEENT: Conjuctivae and lids normal, oropharynx clear with moist mucosa.  Neck: Supple, no elevated JVP, no loud carotid bruits, no thyromegaly or  tenderness.  Lungs: Nonlabored breathing at rest. CTA without rales or wheezes.  Cor: PMI nondisplaced. RRR, normal S1/S2. No pathologic systolic murmurs. No S3 or rub.  Abd: Soft, NTND. No HSM. No bruits. Normoactive bowel sounds.  Skin: Warm and dry.  Musculoskeletal: No kyphosis.  Extremities: No pitting edema.  Neuropsychiatric: Alert and oriented x3, affect appropriate.    ECG Sinus rhythm with ICRBBB.    Problem List and Plan

## 2011-04-21 NOTE — Assessment & Plan Note (Signed)
Recurrent, atypical chest pain as outlined above - not consistent with ischemic etiology, although he does have history of CAD that requires ongoing treatment. We have recommended referral to the pain clinic in [facility] for further management. This will be again arranged.

## 2011-04-21 NOTE — Assessment & Plan Note (Signed)
Continue to address smoking cessation. He has not been able to quit. 

## 2011-04-21 NOTE — Patient Instructions (Addendum)
**Note De-Identified Nicholas [name] Obfuscation** Your physician recommends that you continue on your current medications as directed. Please refer to the Current Medication list given to you today.  Your physician recommends that you schedule a follow-up appointment in: 6 months  You are being referred to pain clinc

## 2011-04-21 NOTE — Assessment & Plan Note (Signed)
As outlined above. Continue medical therapy and observation. He remains at risk for adverse cardiac events over time.

## 2011-05-01 ENCOUNTER — Encounter: Payer: Self-pay | Admitting: Physical Medicine & Rehabilitation

## 2011-05-02 ENCOUNTER — Telehealth: Payer: Self-pay

## 2011-05-02 NOTE — Telephone Encounter (Signed)
**Note De-Identified Robertt Buda Obfuscation** Pt. is advised that he has appt. scheduled to see Dr. Claudette Laws at [facility] on 4-30 at 12:45, he verbalized understanding./LV

## 2011-05-27 ENCOUNTER — Encounter: Payer: BC Managed Care – PPO | Attending: Physical Medicine & Rehabilitation

## 2011-05-27 ENCOUNTER — Ambulatory Visit (HOSPITAL_BASED_OUTPATIENT_CLINIC_OR_DEPARTMENT_OTHER): Payer: BC Managed Care – PPO | Admitting: Physical Medicine & Rehabilitation

## 2011-05-27 ENCOUNTER — Encounter: Payer: Self-pay | Admitting: Physical Medicine & Rehabilitation

## 2011-05-27 VITALS — BP 117/64 | HR 96 | Resp 14 | Ht 69.0 in | Wt 172.0 lb

## 2011-05-27 DIAGNOSIS — R079 Chest pain, unspecified: Secondary | ICD-10-CM | POA: Insufficient documentation

## 2011-05-27 DIAGNOSIS — G548 Other nerve root and plexus disorders: Secondary | ICD-10-CM

## 2011-05-27 DIAGNOSIS — G588 Other specified mononeuropathies: Secondary | ICD-10-CM | POA: Insufficient documentation

## 2011-05-27 DIAGNOSIS — M549 Dorsalgia, unspecified: Secondary | ICD-10-CM

## 2011-05-27 DIAGNOSIS — G8929 Other chronic pain: Secondary | ICD-10-CM

## 2011-05-27 DIAGNOSIS — R209 Unspecified disturbances of skin sensation: Secondary | ICD-10-CM | POA: Insufficient documentation

## 2011-05-27 MED ORDER — GABAPENTIN 300 MG PO CAPS: 300.0000 mg | ORAL_CAPSULE | Freq: Four times a day (QID) | ORAL | Status: DC

## 2011-05-27 NOTE — Patient Instructions (Signed)
We will do an ultrasound-guided intercostal nerve block at the left T9 and left T10 next visit. In the meantime we will prescribe gabapentin 300 mg at night for a week then twice a day for the week then 3 times a day for a week and then 4 times a day

## 2011-05-27 NOTE — Progress Notes (Signed)
  Subjective:    Patient ID: Nicholas [name], male    DOB: 1960/11/01, 51 y.o.   MRN: [id number]  HPI Patient with a three-year history of left sided lateral and anterior rib pain. Intermittent numbness along the same area. No history of rash. He has been evaluated by cardiology. This is felt to be not cardiac related. He has some symptoms of indigestion although this is not all the time. He has been checked for kidney problems as well. Upper and lower endoscopy were unremarkable. The patient is a diabetic and has some numbness in his fingers of the right hand excluding the little finger. In addition he's had some numbness in the left outer calf the past.  Past medical history significant for coronary artery disease and has undergone 5 stents. Pain Inventory Average Pain 8 Pain Right Now 3 My pain is stabbing and aching  In the last 24 hours, has pain interfered with the following? General activity 5 Relation with others 7 Enjoyment of life 7 What TIME of day is your pain at its worst? always present Sleep (in general) Fair  Pain is worse with: nothing Pain improves with: medication and irritation is just there Relief from Meds: 7  Mobility walk without assistance how many minutes can you walk? 14 min ability to climb steps?  yes do you drive?  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  no  Physicians involved in your care Any changes since last visit?  no     Review of Systems  Constitutional: Negative.   HENT: Negative.   Eyes: Negative.   Respiratory: Negative.   Cardiovascular: Negative.   Gastrointestinal: Negative.   Genitourinary: Negative.   Musculoskeletal: Negative.   Skin: Negative.   Neurological: Positive for numbness.       Tingling, spasms  Hematological: Negative.   Psychiatric/Behavioral: Positive for dysphoric mood.       Objective:    Physical Exam  Constitutional: He is oriented to person, place, and time. He appears well-developed and well-nourished.  Neck: Normal range of motion.  Musculoskeletal: Normal range of motion.       Thoracic back: Normal.       Lumbar back: Normal.  Neurological: He is alert and oriented to person, place, and time. He has normal strength. A sensory deficit is present.  Reflex Scores:      Patellar reflexes are 1+ on the right side and 1+ on the left side.      Achilles reflexes are 1+ on the right side and 1+ on the left side.      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 His behavior is normal. Judgment and thought content normal.          Assessment & Plan:  1.  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 He has typical symptoms as well as physical exam findings. He is failed a thoracic epidural injection. I think he would benefit from ultrasound-guided T9 and T10 intercostal nerve injection. If he only gets a short lasting relief with this we can do a radiofrequency procedure at the same levels. In the meantime will start him on gabapentin 300 mg at night and gradually increase to 4 times a day. We may have to go higher on this dose as well. I discussed all this with the patient he agrees with the plan.

## 2011-05-28 ENCOUNTER — Ambulatory Visit: Payer: BC Managed Care – PPO | Admitting: Cardiology

## 2011-06-04 ENCOUNTER — Other Ambulatory Visit: Payer: Self-pay | Admitting: Cardiology

## 2011-06-04 NOTE — Telephone Encounter (Signed)
PT NEEDS RX TODAY  PT ALSO WANTED TO LET us KNOW WHAT THE PAIN DOCTOR WE SENT HIM WANTS TO CARDERIZE A BUNCH OF NERVES IN LEFT SIDE. HE IS CONCERNED THAT THEY ARE NOT STOPPING ANY OF HIS MEDICATION FOR THIS.

## 2011-06-06 ENCOUNTER — Telehealth: Payer: Self-pay | Admitting: Physical Medicine & Rehabilitation

## 2011-06-06 ENCOUNTER — Telehealth: Payer: Self-pay | Admitting: Cardiology

## 2011-06-06 ENCOUNTER — Telehealth: Payer: Self-pay | Admitting: Adult Health

## 2011-06-06 NOTE — Telephone Encounter (Signed)
Cannot take Gabapentin, makes him drunk.  Please call

## 2011-06-06 NOTE — Telephone Encounter (Signed)
Error/tg °

## 2011-06-06 NOTE — Telephone Encounter (Signed)
**Note De-Identified Nicholas [name] Obfuscation** Pt. states that Joni Reining, NP gave him a RX for Tramadol 50 mg to be taken 1 tablet Q6hrs. as needed in October 2012 and that he needs a refill. Pt. advised that he will need to f/u with Dr. Phillips Odor concerning refills for this medication. Pt. States that he was never advised to f/u with his PCP for this medication when it was given to him and that it will take a while for Dr. Phillips Odor to approve and refill. He wants to know if we can refill for 30 days until he can get Dr. Phillips Odor to start refilling. Please advise./LV

## 2011-06-06 NOTE — Telephone Encounter (Signed)
Patient called asking about a script.  Spoke to [place], she stated she was awaiting decision from provider.  Advised patient we would call him back with answer.   Per Larita Fife, script will not be refilled by [place].  He was referred to PCP or Pain Clinic for medication.  Called patient and advised that we would not refill medication.  Advised to contact PCP or Pain Clinic.  He stated that neither would.  Dr. Phillips Odor office would not give due to script from [place].  Advised I would send message to Samara Deist advising of this.   Please contact PCP and advise we are no longer filling this script.

## 2011-06-07 NOTE — Telephone Encounter (Signed)
Reviewed. See follow-up note

## 2011-06-07 NOTE — Telephone Encounter (Signed)
Noted. We will not become involved in pain management.  He will need to be seen by Dr. Phillips Odor for further evaluation and need to continue the medication.

## 2011-06-09 NOTE — Telephone Encounter (Signed)
May stop gabapentin and start Lyrica 75 mg twice a dayfor one week and then increase to 3 times per day #90

## 2011-06-09 NOTE — Telephone Encounter (Signed)
Pt is taking 300mg  QID, any suggestions?

## 2011-06-09 NOTE — Telephone Encounter (Signed)
**Note De-Identified Kalyssa Anker Obfuscation** Misty Stanley, receptionist at Dr. Lamar Blinks office, states that she will notify the PA at her office concerning refill on Tramadol./LV

## 2011-06-09 NOTE — Telephone Encounter (Signed)
Thanks

## 2011-06-11 NOTE — Telephone Encounter (Signed)
ALREADY DONE/TMJ

## 2011-06-13 ENCOUNTER — Telehealth: Payer: Self-pay | Admitting: *Deleted

## 2011-06-13 MED ORDER — TRAMADOL HCL 50 MG PO TABS: 50.0000 mg | ORAL_TABLET | Freq: Four times a day (QID) | ORAL | Status: DC | PRN

## 2011-06-13 NOTE — Telephone Encounter (Signed)
Pt would like to know if we could refill his Tramadol. Please advise.

## 2011-06-13 NOTE — Telephone Encounter (Signed)
Will need to reschedule the injection. Call in Valium 10 mg #1 with 1 refill take one hour prior to injection

## 2011-06-13 NOTE — Telephone Encounter (Signed)
Pt has appointment on 06/17/11 for an injection. Needs something called in for his nerves. Please advise.

## 2011-06-13 NOTE — Telephone Encounter (Signed)
Pt aware that he needs to stop his Plavix a week before appointment. His appointment is on Tuesday and that isn't enough time. He wasn't told to stop his medication here when he scheduled.

## 2011-06-13 NOTE — Telephone Encounter (Signed)
I refilled it

## 2011-06-17 ENCOUNTER — Ambulatory Visit: Payer: BC Managed Care – PPO | Admitting: Physical Medicine & Rehabilitation

## 2011-07-10 ENCOUNTER — Other Ambulatory Visit: Payer: Self-pay | Admitting: Cardiology

## 2011-09-22 ENCOUNTER — Other Ambulatory Visit: Payer: Self-pay | Admitting: *Deleted

## 2011-09-22 MED ORDER — NITROGLYCERIN 0.4 MG SL SUBL: 0.4000 mg | SUBLINGUAL_TABLET | SUBLINGUAL | Status: DC | PRN

## 2011-10-20 ENCOUNTER — Encounter: Payer: Self-pay | Admitting: Cardiology

## 2011-10-20 ENCOUNTER — Ambulatory Visit (INDEPENDENT_AMBULATORY_CARE_PROVIDER_SITE_OTHER): Payer: BC Managed Care – PPO | Admitting: Cardiology

## 2011-10-20 VITALS — BP 110/78 | HR 89 | Ht 69.0 in | Wt 173.0 lb

## 2011-10-20 DIAGNOSIS — I2510 Atherosclerotic heart disease of native coronary artery without angina pectoris: Secondary | ICD-10-CM

## 2011-10-20 DIAGNOSIS — F17200 Nicotine dependence, unspecified, uncomplicated: Secondary | ICD-10-CM

## 2011-10-20 DIAGNOSIS — E782 Mixed hyperlipidemia: Secondary | ICD-10-CM

## 2011-10-20 NOTE — Assessment & Plan Note (Signed)
ECG stable. Continue medical therapy and observation. Myoview from last year reviewed. If he ultimately needs an intercostal nerve injection, he should be able to temporarily hold ASA and Plavix. Followup arranged.

## 2011-10-20 NOTE — Assessment & Plan Note (Signed)
Due for followup FLP and LFT. These will be arranged. 

## 2011-10-20 NOTE — Assessment & Plan Note (Signed)
We discussed smoking cessation again today. 

## 2011-10-20 NOTE — Progress Notes (Signed)
Clinical Summary Mr. Gully is a 51 y.o.male presenting for followup. He was seen in March. He continues to report intermittent to persistent, atypical left sided chest pain. He was following in the pain clinic with Dr. Wynn Banker, but has not been seen since May. He was being considered for an intercostal nerve injection, although this has not occurred.  Exercise Myoview in 2/12 showed no diagnostic ST changes and soft tissue attenuation without clear ischemia, LVEF 63%.  ECG today shows sinus rhythm with RBBB, no acute ST changes.  He is due for followup FLP and LFT.  States that he is smoking one pack per week at this point, has not been able to quit cigarettes completely.   No Known Allergies  Current Outpatient Prescriptions  Medication Sig Dispense Refill  . aspirin 325 MG EC tablet Take 325 mg by mouth daily.        Marland Kitchen gabapentin (NEURONTIN) 300 MG capsule Take 1 capsule (300 mg total) by mouth 4 (four) times daily.  120 capsule  0  . glimepiride (AMARYL) 4 MG tablet Take 4 mg by mouth daily before breakfast.       . nitroGLYCERIN (NITROSTAT) 0.4 MG SL tablet Place 1 tablet (0.4 mg total) under the tongue every 5 (five) minutes as needed for chest pain. TAKE AS DIRECTED FOR CHEST PAIN  25 tablet  6  . ONGLYZA 5 MG TABS tablet 5 mg daily.       . pantoprazole (PROTONIX) 40 MG tablet Take 1 tablet by mouth daily.       Marland Kitchen PLAVIX 75 MG tablet TAKE ONE TABLET BY MOUTH ONCE DAILY.  30 each  6  . simvastatin (ZOCOR) 20 MG tablet Take 20 mg by mouth at bedtime.        . metoprolol tartrate (LOPRESSOR) 25 MG tablet TAKE (1) TABLET BY MOUTH ONCE DAILY.  30 tablet  3  . traMADol (ULTRAM) 50 MG tablet Take 1 tablet (50 mg total) by mouth every 6 (six) hours as needed for pain.  120 tablet  5  . DISCONTD: topiramate (TOPAMAX) 25 MG tablet Take 25 mg by mouth daily.         Past Medical History  Diagnosis Date  . Type 2 diabetes mellitus   . Mixed hyperlipidemia   . Myocardial infarction     . GERD (gastroesophageal reflux disease)   . Helicobacter pylori (H. pylori)   . Colon polyp     Hyperplastic rectosigmoid polyp 9/08  . Hiatal hernia   . Coronary atherosclerosis of native coronary artery     DES to PLB, BMS to mid and distal RCA, DES to circumflex and distal LAD, 6/10    Social History Mr. Mullendore reports that he has been smoking Cigarettes.  He has a 34 pack-year smoking history. He has never used smokeless tobacco. Mr. Matulich reports that he does not drink alcohol.  Review of Systems No palpitations or syncope. Appetite OK. No reported bleeding episodes.  Physical Examination Filed Vitals:   10/20/11 0845  BP: 110/78  Pulse: 89   Filed Weights   10/20/11 0845  Weight: 173 lb (78.472 kg)    HEENT: Conjuctivae and lids normal, oropharynx clear with moist mucosa.  Neck: Supple, no elevated JVP, no loud carotid bruits, no thyromegaly or tenderness.  Lungs: Nonlabored breathing at rest. CTA without rales or wheezes.  Cor: PMI nondisplaced. RRR, normal S1/S2. No pathologic systolic murmurs. No S3 or rub.  Abd: Soft, NTND. No HSM. No  bruits. Normoactive bowel sounds.  Skin: Warm and dry.  Musculoskeletal: No kyphosis.  Extremities: No pitting edema.  Neuropsychiatric: Alert and oriented x3, affect appropriate.     Problem List and Plan   CORONARY ATHEROSCLEROSIS NATIVE CORONARY ARTERY ECG stable. Continue medical therapy and observation. Myoview from last year reviewed. If he ultimately needs an intercostal nerve injection, he should be able to temporarily hold ASA and Plavix. Followup arranged.  HYPERLIPIDEMIA Due for followup FLP and LFT. These will be arranged.  TOBACCO ABUSE We discussed smoking cessation again today.    Jonelle Sidle, M.D., F.A.C.C.

## 2011-10-20 NOTE — Patient Instructions (Addendum)
Your physician recommends that you schedule a follow-up appointment in: 6 month  Your physician recommends that you return for lab work in: Within the week

## 2011-10-24 ENCOUNTER — Encounter: Payer: Self-pay | Admitting: *Deleted

## 2012-01-06 ENCOUNTER — Encounter: Payer: Self-pay | Admitting: Adult Health

## 2012-01-06 ENCOUNTER — Other Ambulatory Visit: Payer: Self-pay | Admitting: Cardiology

## 2012-01-06 ENCOUNTER — Ambulatory Visit (INDEPENDENT_AMBULATORY_CARE_PROVIDER_SITE_OTHER): Payer: BC Managed Care – PPO | Admitting: Adult Health

## 2012-01-06 VITALS — BP 120/70 | HR 86 | Wt 176.0 lb

## 2012-01-06 DIAGNOSIS — F17200 Nicotine dependence, unspecified, uncomplicated: Secondary | ICD-10-CM

## 2012-01-06 DIAGNOSIS — G588 Other specified mononeuropathies: Secondary | ICD-10-CM

## 2012-01-06 DIAGNOSIS — I2510 Atherosclerotic heart disease of native coronary artery without angina pectoris: Secondary | ICD-10-CM

## 2012-01-06 DIAGNOSIS — R079 Chest pain, unspecified: Secondary | ICD-10-CM

## 2012-01-06 DIAGNOSIS — G548 Other nerve root and plexus disorders: Secondary | ICD-10-CM

## 2012-01-06 NOTE — Assessment & Plan Note (Signed)
I was pleased to hear that he has stopped smoking times one month. He is using electronic cigarettes at this time. He believes is more related to habit vs. addiction now. He has experienced some irritability as he has stopped smoking but he said that this is subsiding. I congratulated him on his smoking cessation and encouraged discontinued behavior.

## 2012-01-06 NOTE — Progress Notes (Signed)
HPI: [name] is a 51 year old Caucasian male patient of Dr. Diona Browner,  we are following for ongoing assessment and treatment with known history of myocardial infarction, CAD with drug-eluting stent to the PLB, bare-metal stent to the mid and distal right coronary artery, drug-eluting stent to the circumflex and distal LAD, on 6/10; CT history of type 2 diabetes next hyperlipidemia GERD pain. He is being followed by pain management clinic with plans for I nerve cauterization of the intercostal nerve, ongoing discomfort in his chest. He comes today with similar complaints. He states the pain is there all the time, worsened with movement of his torso, and palpation. He denies any pain similar to that which he experienced with his MI. He continues to take tramadol 50 mg when necessary. He has quit smoking times one month, and uses an electronic cigarette.  No Known Allergies  Current Outpatient Prescriptions  Medication Sig Dispense Refill  . aspirin 325 MG EC tablet Take 325 mg by mouth daily.        Marland Kitchen gabapentin (NEURONTIN) 300 MG capsule Take 1 capsule (300 mg total) by mouth 4 (four) times daily.  120 capsule  0  . glimepiride (AMARYL) 4 MG tablet Take 4 mg by mouth daily before breakfast.       . metoprolol tartrate (LOPRESSOR) 25 MG tablet TAKE (1) TABLET BY MOUTH ONCE DAILY.  30 tablet  3  . nitroGLYCERIN (NITROSTAT) 0.4 MG SL tablet Place 1 tablet (0.4 mg total) under the tongue every 5 (five) minutes as needed for chest pain. TAKE AS DIRECTED FOR CHEST PAIN  25 tablet  6  . ONGLYZA 5 MG TABS tablet 5 mg daily.       . pantoprazole (PROTONIX) 40 MG tablet Take 1 tablet by mouth daily.       Marland Kitchen PLAVIX 75 MG tablet TAKE ONE TABLET BY MOUTH ONCE DAILY.  30 each  6  . simvastatin (ZOCOR) 20 MG tablet Take 20 mg by mouth at bedtime.        . traMADol (ULTRAM) 50 MG tablet Take 1 tablet (50 mg total) by mouth every 6 (six) hours as needed for pain.  120 tablet  5  . [DISCONTINUED] topiramate  (TOPAMAX) 25 MG tablet Take 25 mg by mouth daily.         Past Medical History  Diagnosis Date  . Type 2 diabetes mellitus   . Mixed hyperlipidemia   . Myocardial infarction   . GERD (gastroesophageal reflux disease)   . Helicobacter pylori (H. pylori)   . Colon polyp     Hyperplastic rectosigmoid polyp 9/08  . Hiatal hernia   . Coronary atherosclerosis of native coronary artery     DES to PLB, BMS to mid and distal RCA, DES to circumflex and distal LAD, 6/10    Past Surgical History  Procedure Date  . Collapsed lung     15 years ago  . 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 Cholecystectomy     ZOX:WRUEAV of systems complete and found to be negative unless listed above  PHYSICAL EXAM BP 120/70  Pulse 86  Wt 176 lb (79.833 kg)  SpO2 97%  General: Well developed, well nourished, in no acute distress Head: Eyes PERRLA, No xanthomas.   Normal cephalic and atramatic  Lungs: Clear bilaterally to auscultation and percussion. Heart: HRRR S1 S2, without MRG.  Pulses are 2+ & equal.  No carotid bruit. No JVD.  No abdominal bruits. No femoral bruits. Abdomen: Bowel sounds are positive, abdomen soft and non-tender without masses or                  Hernia's noted. Msk:  Back normal, normal gait. Normal strength and tone for age. Pain is reproducible with palpation over the left pectoral area, and left intercostal area, Extremities: No clubbing, cyanosis or edema.  DP +1 Neuro: Alert and oriented X 3. Psych:  Good affect, responds appropriately  EKG: Normal sinus rhythm with right bundle branch block, rate 83 beats per minute.  ASSESSMENT AND PLAN

## 2012-01-06 NOTE — Progress Notes (Deleted)
Name: Nicholas [name]    DOB: [birth year]  Age: 51 y.o.  MR#: [id number]       PCP:  Colette Ribas, MD      Insurance: @PAYORNAME @   CC:   No chief complaint on file.   VS BP 120/70  Pulse 86  Wt 176 lb (79.833 kg)  SpO2 97%  Weights Current Weight  01/06/12 176 lb (79.833 kg)  10/20/11 173 lb (78.472 kg)  05/27/11 172 lb (78.019 kg)    Blood Pressure  BP Readings from Last 3 Encounters:  01/06/12 120/70  10/20/11 110/78  05/27/11 117/64     Admit date:  (Not on file) Last encounter with RMR:  Visit date not found   Allergy No Known Allergies  Current Outpatient Prescriptions  Medication Sig Dispense Refill  . aspirin 325 MG EC tablet Take 325 mg by mouth daily.        Marland Kitchen gabapentin (NEURONTIN) 300 MG capsule Take 1 capsule (300 mg total) by mouth 4 (four) times daily.  120 capsule  0  . glimepiride (AMARYL) 4 MG tablet Take 4 mg by mouth daily before breakfast.       . metoprolol tartrate (LOPRESSOR) 25 MG tablet TAKE (1) TABLET BY MOUTH ONCE DAILY.  30 tablet  3  . nitroGLYCERIN (NITROSTAT) 0.4 MG SL tablet Place 1 tablet (0.4 mg total) under the tongue every 5 (five) minutes as needed for chest pain. TAKE AS DIRECTED FOR CHEST PAIN  25 tablet  6  . ONGLYZA 5 MG TABS tablet 5 mg daily.       . pantoprazole (PROTONIX) 40 MG tablet Take 1 tablet by mouth daily.       Marland Kitchen PLAVIX 75 MG tablet TAKE ONE TABLET BY MOUTH ONCE DAILY.  30 each  6  . simvastatin (ZOCOR) 20 MG tablet Take 20 mg by mouth at bedtime.        . traMADol (ULTRAM) 50 MG tablet Take 1 tablet (50 mg total) by mouth every 6 (six) hours as needed for pain.  120 tablet  5  . [DISCONTINUED] topiramate (TOPAMAX) 25 MG tablet Take 25 mg by mouth daily.         Discontinued Meds:   There are no discontinued medications.  Patient Active Problem List  Diagnosis  . HYPERLIPIDEMIA  . TOBACCO ABUSE  . CORONARY ATHEROSCLEROSIS NATIVE CORONARY ARTERY  . GERD  . BACK PAIN, CHRONIC  . Intercostal neuralgia     LABS No visits with results within 3 Month(s) from this visit. Latest known visit with results is:  Office Visit on 06/27/2010  Component Date Value  . Cholesterol 11/08/2010 233*  . Triglycerides 11/08/2010 359*  . HDL 11/08/2010 31*  . Total CHOL/HDL Ratio 11/08/2010 7.5   . VLDL 11/08/2010 72*  . LDL Cholesterol 11/08/2010 130*  . Total Bilirubin 11/08/2010 0.4   . Bilirubin, Direct 11/08/2010 0.1   . Indirect Bilirubin 11/08/2010 0.3   . Alkaline Phosphatase 11/08/2010 76   . AST 11/08/2010 18   . ALT 11/08/2010 26   . Total Protein 11/08/2010 6.7   . Albumin 11/08/2010 4.5      Results for this Opt Visit:     Results for orders placed in visit on 06/27/10  LIPID PANEL      Component Value Range   Cholesterol 233 (*) 0 - 200 mg/dL   Triglycerides 914 (*) <150 mg/dL   HDL 31 (*) >78 mg/dL   Total CHOL/HDL Ratio 7.5  VLDL 72 (*) 0 - 40 mg/dL   LDL Cholesterol 098 (*) 0 - 99 mg/dL  HEPATIC FUNCTION PANEL      Component Value Range   Total Bilirubin 0.4  0.3 - 1.2 mg/dL   Bilirubin, Direct 0.1  0.0 - 0.3 mg/dL   Indirect Bilirubin 0.3  0.0 - 0.9 mg/dL   Alkaline Phosphatase 76  39 - 117 U/L   AST 18  0 - 37 U/L   ALT 26  0 - 53 U/L   Total Protein 6.7  6.0 - 8.3 g/dL   Albumin 4.5  3.5 - 5.2 g/dL    EKG Orders placed in visit on 01/06/12  . EKG 12-LEAD     Prior Assessment and Plan Problem List as of 01/06/2012            Cardiology Problems   HYPERLIPIDEMIA   Last Assessment & Plan Note   10/20/2011 Office Visit Signed 10/20/2011  9:20 AM by Jonelle Sidle, MD    Due for followup FLP and LFT. These will be arranged.    CORONARY ATHEROSCLEROSIS NATIVE CORONARY ARTERY   Last Assessment & Plan Note   10/20/2011 Office Visit Signed 10/20/2011  9:20 AM by Jonelle Sidle, MD    ECG stable. Continue medical therapy and observation. Myoview from last year reviewed. If he ultimately needs an intercostal nerve injection, he should be able to  temporarily hold ASA and Plavix. Followup arranged.      Other   TOBACCO ABUSE   Last Assessment & Plan Note   10/20/2011 Office Visit Signed 10/20/2011  9:21 AM by Jonelle Sidle, MD    We discussed smoking cessation again today.    GERD   Last Assessment & Plan Note   04/24/2010 Office Visit Signed 05/05/2010  3:47 PM by Corbin Ade, MD    Well controlled on pantoprazole.    BACK PAIN, CHRONIC   Intercostal neuralgia       Imaging: No results found.   FRS Calculation: Score not calculated. Missing: Total Cholesterol

## 2012-01-06 NOTE — Assessment & Plan Note (Signed)
I do not believe the pain is experienced an is cardiac in etiology. The pain is reproducible with palpation. It is not different or new from pain he is only experiencing. I have advised him take tramadol 50 mg every 6 hours to assist with pain control along with any over-the-counter medications to include Aleve or ibuprofen. I have encouraged her to followup with pain management clinic to have the intercostal nerve cauterization completed. He states that he will keep his appointment with them in January 2014.

## 2012-01-06 NOTE — Patient Instructions (Addendum)
Your physician recommends that you schedule a follow-up appointment in: as planned

## 2012-01-06 NOTE — Assessment & Plan Note (Signed)
At this time a focus will be on risk reduction and lifestyle management. He has quit smoking, he remains active, and is watching what he eats. He is complaining that he has lost so much weight with changing his diet. I have advised him to continue to be active. We will continue his current medication regimen at this time and followup with Dr. Diona Browner her previously scheduled appointment.

## 2012-01-07 ENCOUNTER — Encounter: Payer: Self-pay | Admitting: *Deleted

## 2012-01-07 LAB — LIPID PANEL
Cholesterol: 185 mg/dL (ref 0–200)
HDL: 25 mg/dL — ABNORMAL LOW (ref >39)
Triglycerides: 268 mg/dL — ABNORMAL HIGH (ref <150)
VLDL: 54 mg/dL — ABNORMAL HIGH (ref 0–40)

## 2012-01-07 LAB — HEPATIC FUNCTION PANEL
ALT: 23 U/L (ref 0–53)
Albumin: 4.2 g/dL (ref 3.5–5.2)
Alkaline Phosphatase: 67 U/L (ref 39–117)
Indirect Bilirubin: 0.3 mg/dL (ref 0.0–0.9)
Total Protein: 6.5 g/dL (ref 6.0–8.3)

## 2012-05-12 ENCOUNTER — Other Ambulatory Visit: Payer: Self-pay | Admitting: Cardiology

## 2012-06-09 ENCOUNTER — Encounter: Payer: Self-pay | Admitting: Adult Health

## 2012-06-09 ENCOUNTER — Ambulatory Visit (INDEPENDENT_AMBULATORY_CARE_PROVIDER_SITE_OTHER): Payer: BC Managed Care – PPO | Admitting: Adult Health

## 2012-06-09 VITALS — BP 108/60 | HR 95 | Ht 69.0 in | Wt 176.0 lb

## 2012-06-09 DIAGNOSIS — E785 Hyperlipidemia, unspecified: Secondary | ICD-10-CM

## 2012-06-09 DIAGNOSIS — F17200 Nicotine dependence, unspecified, uncomplicated: Secondary | ICD-10-CM

## 2012-06-09 DIAGNOSIS — I2510 Atherosclerotic heart disease of native coronary artery without angina pectoris: Secondary | ICD-10-CM

## 2012-06-09 NOTE — Progress Notes (Signed)
HPI: [name] is a 52 year old patient of Dr. Diona Browner we are following for ongoing assessment and treatment of known CAD, with drug-eluting stent to the PL branch of the RCA, bare-metal stents the mid and distal right coronary artery, drug-eluting stent to the circumflex and distal LAD, completed in June of 2010. Other history includes diabetes hyperlipidemia, and GERD pain. He is also followed by the pain clinic for nerve cauterization of the intercostal nerve secondary to ongoing discomfort in his chest which was found to be noncardiac. The patient was last seen in December 2013 and was doing well, he quit smoking, and was looking forward to being seen by pain management clinic for nerve cauterization.   He states using the tramadol has helped him to the point that he has not longer required folllow up with pain management clinic and he has not had the nerve cauterization.  He is without recurrent chest pain,  No Known Allergies  Current Outpatient Prescriptions  Medication Sig Dispense Refill  . aspirin 325 MG EC tablet Take 325 mg by mouth daily.        . clopidogrel (PLAVIX) 75 MG tablet TAKE ONE TABLET BY MOUTH ONCE DAILY.  30 tablet  6  . gabapentin (NEURONTIN) 300 MG capsule Take 1 capsule (300 mg total) by mouth 4 (four) times daily.  120 capsule  0  . glimepiride (AMARYL) 4 MG tablet Take 4 mg by mouth daily before breakfast.       . metoprolol tartrate (LOPRESSOR) 25 MG tablet TAKE (1) TABLET BY MOUTH ONCE DAILY.  30 tablet  3  . nitroGLYCERIN (NITROSTAT) 0.4 MG SL tablet Place 1 tablet (0.4 mg total) under the tongue every 5 (five) minutes as needed for chest pain. TAKE AS DIRECTED FOR CHEST PAIN  25 tablet  6  . ONGLYZA 5 MG TABS tablet 5 mg daily.       . pantoprazole (PROTONIX) 40 MG tablet Take 1 tablet by mouth daily.       . simvastatin (ZOCOR) 20 MG tablet Take 20 mg by mouth at bedtime.        . traMADol (ULTRAM) 50 MG tablet Take 1 tablet (50 mg total) by mouth every 6  (six) hours as needed for pain.  120 tablet  5  . [DISCONTINUED] topiramate (TOPAMAX) 25 MG tablet Take 25 mg by mouth daily.        No current facility-administered medications for this visit.    Past Medical History  Diagnosis Date  . Type 2 diabetes mellitus   . Mixed hyperlipidemia   . Myocardial infarction   . GERD (gastroesophageal reflux disease)   . Helicobacter pylori (H. pylori)   . Colon polyp     Hyperplastic rectosigmoid polyp 9/08  . Hiatal hernia   . Coronary atherosclerosis of native coronary artery     DES to PLB, BMS to mid and distal RCA, DES to circumflex and distal LAD, 6/10    Past Surgical History  Procedure Laterality Date  . Collapsed lung      15 years ago  . 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 Cholecystectomy      ZOX:WRUEAV of systems complete and found to be negative unless listed above  PHYSICAL EXAM BP 108/60  Pulse 95  Ht 5\' 9"  (1.753 m)  Wt 176 lb (79.833 kg)  BMI 25.98 kg/m2  SpO2 97%  General: Well developed, well nourished, in no acute distress  Head: Eyes PERRLA, No xanthomas.   Normal cephalic and atramatic  Lungs: Clear bilaterally to auscultation and percussion. Heart: HRRR S1 S2, without MRG.  Pulses are 2+ & equal.            No carotid bruit. No JVD.  No abdominal bruits. No femoral bruits. Abdomen: Bowel sounds are positive, abdomen soft and non-tender without masses or                  Hernia's noted. Msk:  Back normal, normal gait. Normal strength and tone for age. Extremities: No clubbing, cyanosis or edema.  DP +1 Neuro: Alert and oriented X 3. Psych:  Good affect, responds appropriately  EKG: NSR with ICRBBB.  Rate of 95 bpm. Inferior infarct.   ASSESSMENT AND PLAN

## 2012-06-09 NOTE — Assessment & Plan Note (Signed)
Continues to work on smoking cessation with use of electronic cigarettes.

## 2012-06-09 NOTE — Assessment & Plan Note (Signed)
No complaints of chest pain or cardiac complaints of DOE.  He is medically compliant. Will continue current medications. I have advised that he take B-vitamins for help with skin irritations from Plavix with frequent cuts and bruises.

## 2012-06-09 NOTE — Patient Instructions (Signed)
Your physician wants you to follow-up in: 1 YEAR.  You will receive a reminder letter in the mail two months in advance. If you don't receive a letter, please call our office to schedule the follow-up appointment.  Your physician recommends that you continue on your current medications as directed. Please refer to the Current Medication list given to you today.  

## 2012-06-09 NOTE — Assessment & Plan Note (Signed)
He will follow up with his PCP for ongoing labs. Will repeat lipids and LFTs on next visit if he does not have them completed by next appt.

## 2012-08-24 ENCOUNTER — Ambulatory Visit: Payer: BC Managed Care – PPO | Admitting: Internal Medicine

## 2012-09-01 ENCOUNTER — Ambulatory Visit: Payer: BC Managed Care – PPO | Admitting: Internal Medicine

## 2012-09-10 ENCOUNTER — Encounter: Payer: Self-pay | Admitting: Internal Medicine

## 2012-09-10 ENCOUNTER — Ambulatory Visit (INDEPENDENT_AMBULATORY_CARE_PROVIDER_SITE_OTHER): Payer: BC Managed Care – PPO | Admitting: Internal Medicine

## 2012-09-10 VITALS — BP 106/65 | HR 93 | Temp 97.4°F | Ht 69.0 in | Wt 176.8 lb

## 2012-09-10 DIAGNOSIS — K6289 Other specified diseases of anus and rectum: Secondary | ICD-10-CM

## 2012-09-10 DIAGNOSIS — K629 Disease of anus and rectum, unspecified: Secondary | ICD-10-CM

## 2012-09-10 DIAGNOSIS — K219 Gastro-esophageal reflux disease without esophagitis: Secondary | ICD-10-CM

## 2012-09-10 NOTE — Progress Notes (Signed)
Primary Care Physician:  Colette Ribas, MD Primary Gastroenterologist:  Dr. Jena Gauss  Pre-Procedure History & Physical: HPI:  Nicholas [name] is a 52 y.o. male here for concern of an anal lesion which has resolved on its own. Patient history of perianal skin tags. He never saw a dermatologist as recommended previously because he said they "fell off" but felt something in his anal opening recently and made the appointment. It is now gone. He's never had any bleeding. He's never had any pain. Normal bowel function. Essentially negative screening colonoscopy 2008. He is due for routine screening 2018. Has GERD well-controlled on protonix. Has known coronary artery disease which is under control. Has chest wall pain  - status post ablative treatments of the intercostal nerves. Still has problems with it. From a GI standpoint, currently, he is doing very well. Past Medical History  Diagnosis Date  . Type 2 diabetes mellitus   . Mixed hyperlipidemia   . Myocardial infarction   . GERD (gastroesophageal reflux disease)   . Helicobacter pylori (H. pylori) 09/2009    treated with helidac  . Colon polyp     Hyperplastic rectosigmoid polyp 9/08  . Hiatal hernia   . Coronary atherosclerosis of native coronary artery     DES to PLB, BMS to mid and distal RCA, DES to circumflex and distal LAD, 6/10  . Hyperplastic colon polyp 2008  . Hiatal hernia   . Anal lesion     anal papilla/tags- external lesion    Past Surgical History  Procedure Laterality Date  . Collapsed lung      15 years ago  . 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 Cholecystectomy    . Esophagogastroduodenoscopy  10/12/2006    Dr. Jena Gauss- examination of the tubular esophagus revealed no mucosal abnormalities. the EG junction was easily traversed. small hiatal hernia, the gastric mucosa o/w appeared normal. there was no infiltrating process or frank ulcer seen.  . Colonoscopy  10/12/2006    Dr. Jena Gauss- rectosigmoid polyp  s/phot snare polypectomy o/w normal rectum and terminal ileum. hyperplastic polyp on bx    Prior to Admission medications   Medication Sig Start Date End Date Taking? Authorizing Provider  aspirin 325 MG EC tablet Take 325 mg by mouth daily.     Yes Historical Provider, MD  clopidogrel (PLAVIX) 75 MG tablet TAKE ONE TABLET BY MOUTH ONCE DAILY. 05/12/12  Yes Jonelle Sidle, MD  glimepiride (AMARYL) 4 MG tablet Take 4 mg by mouth daily before breakfast.  05/08/10  Yes Historical Provider, MD  metoprolol tartrate (LOPRESSOR) 25 MG tablet TAKE (1) TABLET BY MOUTH ONCE DAILY. 06/18/10  Yes Jonelle Sidle, MD  nitroGLYCERIN (NITROSTAT) 0.4 MG SL tablet Place 1 tablet (0.4 mg total) under the tongue every 5 (five) minutes as needed for chest pain. TAKE AS DIRECTED FOR CHEST PAIN 09/22/11  Yes Jonelle Sidle, MD  ONGLYZA 5 MG TABS tablet 5 mg daily.  06/18/10  Yes Historical Provider, MD  pantoprazole (PROTONIX) 40 MG tablet Take 1 tablet by mouth daily.  04/11/10  Yes Historical Provider, MD  simvastatin (ZOCOR) 20 MG tablet Take 20 mg by mouth at bedtime.     Yes Historical Provider, MD  gabapentin (NEURONTIN) 300 MG capsule Take 1 capsule (300 mg total) by mouth 4 (four) times daily. 05/27/11 06/09/12  Erick Colace, MD  traMADol (ULTRAM) 50 MG tablet Take 1 tablet (50 mg total) by mouth  every 6 (six) hours as needed for pain. 06/13/11 06/09/12  Erick Colace, MD    Allergies as of 09/10/2012  . (No Known Allergies)    Family History  Problem Relation Age of Onset  . Diabetes Mother   . Heart disease Father   . Cancer Brother     History   Social History  . Marital Status: Married    Spouse Name: N/A    Number of Children: N/A  . Years of Education: N/A   Occupational History  . Self-employed    Social History Main Topics  . Smoking status: Light Tobacco Smoker -- 1.00 packs/day for 34 years    Types: Cigarettes  . Smokeless tobacco: Never Used  . Alcohol Use: No  .  Drug Use: No  . Sexual Activity: Yes    Partners: Female    Birth Control/ Protection: Pill   Other Topics Concern  . Not on file   Social History Narrative  . No narrative on file    Review of Systems: See HPI, otherwise negative ROS  Physical Exam: BP 106/65  Pulse 93  Temp(Src) 97.4 F (36.3 C) (Oral)  Ht 5\' 9"  (1.753 m)  Wt 176 lb 12.8 oz (80.196 kg)  BMI 26.1 kg/m2 General:   Alert,  Well-developed, well-nourished, pleasant and cooperative in NAD Skin:  Intact without significant lesions or rashes. Inspection of the perianal skin reveals no abnormalities digital exam reveals no palpable abnormalities no mass. Scant brown stools Hemoccult negative  Impression/Plan:  Patient with recurrent perianal skin tags. What he fell recently was likely a skin tag. I doubt a thrombosed hemorrhoid or other process. He is now asymptomatic. Digital rectal examination is negative. GERD symptoms well controlled on protonix. We talked about the multipronged broach regarding the management of reflux disease.   Recommendations:  Continue Protonix.  Literature on GERD provided. Office followup in one year. Plan for routine screening colonoscopy in 2018. Call if any interim problems develop.

## 2012-09-10 NOTE — Patient Instructions (Signed)
GERD information  Continue protonix  Smoking cessation recommended  Screening colonoscopy 2018  Office visit 1 year

## 2012-10-25 ENCOUNTER — Other Ambulatory Visit: Payer: Self-pay | Admitting: Cardiology

## 2013-06-09 ENCOUNTER — Ambulatory Visit (INDEPENDENT_AMBULATORY_CARE_PROVIDER_SITE_OTHER): Payer: BC Managed Care – PPO | Admitting: Cardiology

## 2013-06-09 ENCOUNTER — Encounter: Payer: Self-pay | Admitting: Cardiology

## 2013-06-09 VITALS — BP 96/58 | HR 82 | Ht 69.0 in | Wt 173.0 lb

## 2013-06-09 DIAGNOSIS — I2510 Atherosclerotic heart disease of native coronary artery without angina pectoris: Secondary | ICD-10-CM

## 2013-06-09 DIAGNOSIS — I7090 Unspecified atherosclerosis: Secondary | ICD-10-CM

## 2013-06-09 DIAGNOSIS — E785 Hyperlipidemia, unspecified: Secondary | ICD-10-CM

## 2013-06-09 DIAGNOSIS — F17200 Nicotine dependence, unspecified, uncomplicated: Secondary | ICD-10-CM

## 2013-06-09 NOTE — Assessment & Plan Note (Signed)
Symptomatically stable on medical therapy. ECG reviewed. Followup planned for 6 months.

## 2013-06-09 NOTE — Assessment & Plan Note (Signed)
Smoking cessation has been discussed, he has not been able to quit.

## 2013-06-09 NOTE — Patient Instructions (Signed)
Your physician wants you to follow-up in: 6 months You will receive a reminder letter in the mail two months in advance. If you don't receive a letter, please call our office to schedule the follow-up appointment.     Your physician recommends that you continue on your current medications as directed. Please refer to the Current Medication list given to you today.      Thank you for choosing [facility] !        

## 2013-06-09 NOTE — Assessment & Plan Note (Signed)
Continues on statin therapy, recent lab work obtained for followup per primary care, results pending. Will review when available.

## 2013-06-09 NOTE — Progress Notes (Signed)
Clinical Summary Nicholas [name] is a 53 y.o.male last seen by [name] NP in May 2014. He reports no anginal chest pain or nitroglycerin use, still has trouble with chronic left lateral thoracic discomfort and is on pain medication for this. He just recently had followup lab work, results pending.  He reports compliance with his medications, no obvious difficulty with side effects. ECG today shows sinus rhythm with right heart block.  Exercise Myoview in 2/12 showed no diagnostic ST changes and soft tissue attenuation without clear ischemia, LVEF 63%.   No Known Allergies  Current Outpatient Prescriptions  Medication Sig Dispense Refill  . aspirin 325 MG EC tablet Take 325 mg by mouth daily.        . clopidogrel (PLAVIX) 75 MG tablet TAKE ONE TABLET BY MOUTH ONCE DAILY.  30 tablet  6  . glimepiride (AMARYL) 4 MG tablet Take 4 mg by mouth daily before breakfast.       . metoprolol tartrate (LOPRESSOR) 25 MG tablet TAKE (1) TABLET BY MOUTH ONCE DAILY.  30 tablet  3  . NITROSTAT 0.4 MG SL tablet DISSOLVE 1 TABLET UNDER TONGUE EVERY 5 MINUTES UP TO 15 MIN FOR CHESTPAIN. IF NO RELIEF CALL 911.  25 tablet  3  . ONGLYZA 5 MG TABS tablet 5 mg daily.       . pantoprazole (PROTONIX) 40 MG tablet Take 1 tablet by mouth daily.       . simvastatin (ZOCOR) 20 MG tablet Take 20 mg by mouth at bedtime.        . gabapentin (NEURONTIN) 300 MG capsule Take 1 capsule (300 mg total) by mouth 4 (four) times daily.  120 capsule  0  . traMADol (ULTRAM) 50 MG tablet Take 1 tablet (50 mg total) by mouth every 6 (six) hours as needed for pain.  120 tablet  5  . [DISCONTINUED] topiramate (TOPAMAX) 25 MG tablet Take 25 mg by mouth daily.        No current facility-administered medications for this visit.    Past Medical History  Diagnosis Date  . Type 2 diabetes mellitus   . Mixed hyperlipidemia   . Myocardial infarction   . GERD (gastroesophageal reflux disease)   . Helicobacter pylori (H. pylori) 09/2009      treated with helidac  . Colon polyp     Hyperplastic rectosigmoid polyp 9/08  . Hiatal hernia   . Coronary atherosclerosis of native coronary artery     DES to PLB, BMS to mid and distal RCA, DES to circumflex and distal LAD, 6/10  . Hyperplastic colon polyp 2008  . Hiatal hernia   . Anal lesion     anal papilla/tags- external lesion    Social History Mr. Kirchgessner reports that he has been smoking Cigarettes.  He has a 34 pack-year smoking history. He has never used smokeless tobacco. Mr. Douthat reports that he does not drink alcohol.  Review of Systems No palpitations, dizziness, syncope. Otherwise as outlined.  Physical Examination Filed Vitals:   06/09/13 0951  BP: 96/58  Pulse: 82   Filed Weights   06/09/13 0951  Weight: 173 lb (78.472 kg)    HEENT: Conjuctivae and lids normal, oropharynx clear with moist mucosa.  Neck: Supple, no elevated JVP, no loud carotid bruits, no thyromegaly or tenderness.  Lungs: Nonlabored breathing at rest. CTA without rales or wheezes.  Cor: PMI nondisplaced. RRR, normal S1/S2. No pathologic systolic murmurs. No S3 or rub.  Abd: Soft, NTND.  No HSM. No bruits. Normoactive bowel sounds.  Skin: Warm and dry.  Musculoskeletal: No kyphosis.  Extremities: No pitting edema.  Neuropsychiatric: Alert and oriented x3, affect appropriate.    Problem List and Plan   CORONARY ATHEROSCLEROSIS NATIVE CORONARY ARTERY Symptomatically stable on medical therapy. ECG reviewed. Followup planned for 6 months.  HYPERLIPIDEMIA Continues on statin therapy, recent lab work obtained for followup per primary care, results pending. Will review when available.  TOBACCO ABUSE Smoking cessation has been discussed, he has not been able to quit.    Satira Sark, M.D., F.A.C.C.

## 2013-06-23 ENCOUNTER — Telehealth: Payer: Self-pay | Admitting: *Deleted

## 2013-06-23 NOTE — Telephone Encounter (Signed)
Patient has had 3-4 days of sudden,intermittent pain in "the top of my heart" describes as air going out of a balloon suddenly, what he calls "hard hits". He has never used NTG.I asked him to take one as we were talking.He noted flushing sensation and headache but states he was not experiencing pain at the moment he took it. He said this has been an issue for some time.I told him I would message you and call him back with directions

## 2013-06-23 NOTE — Telephone Encounter (Signed)
He has a history of CAD as well as noncardiac chest discomfort. He did not mention any worsening symptoms at his recent followup visit. He can certainly try sublingual nitroglycerin - if this is helpful, we might consider starting him on Imdur. If these are fairly quick, fleeting chest pains however, I doubt that it is anginal.

## 2013-06-23 NOTE — Telephone Encounter (Signed)
Pt is having "hits" in chest.tmj

## 2013-06-23 NOTE — Telephone Encounter (Signed)
Patient has not had any further episodes of chest pain and will call back if anything changes

## 2013-07-07 ENCOUNTER — Other Ambulatory Visit: Payer: Self-pay | Admitting: Cardiology

## 2013-12-14 ENCOUNTER — Ambulatory Visit (INDEPENDENT_AMBULATORY_CARE_PROVIDER_SITE_OTHER): Payer: BC Managed Care – PPO | Admitting: Cardiology

## 2013-12-14 ENCOUNTER — Encounter: Payer: Self-pay | Admitting: Cardiology

## 2013-12-14 VITALS — BP 110/78 | HR 95 | Ht 68.0 in | Wt 176.0 lb

## 2013-12-14 DIAGNOSIS — I2510 Atherosclerotic heart disease of native coronary artery without angina pectoris: Secondary | ICD-10-CM

## 2013-12-14 DIAGNOSIS — E785 Hyperlipidemia, unspecified: Secondary | ICD-10-CM

## 2013-12-14 DIAGNOSIS — E782 Mixed hyperlipidemia: Secondary | ICD-10-CM

## 2013-12-14 NOTE — Assessment & Plan Note (Signed)
Symptomatically stable at this time. Plan to continue current medical regimen including aspirin, Plavix, beta blocker, and statin. He has nitroglycerin for use as needed. We will likely proceed with a follow-up stress test around the time of his next visit in 6 months for ischemic surveillance.

## 2013-12-14 NOTE — Assessment & Plan Note (Signed)
He continues on simvastatin. We will arrange follow-up FLP and LFTs.

## 2013-12-14 NOTE — Progress Notes (Signed)
Reason for visit: CAD, hyperlipidemia  Clinical Summary Nicholas [name] is a 53 y.o.male last seen in May. He comes in today for a routine visit. Still works full-time. Reports no major functional limitations. He is not describing any obvious angina symptoms or increasing shortness of breath over NYHA class II. Does have sporadic, fleeting chest pains which are very atypical for ischemic heart disease. Also a neuropathic left lateral costal pain. He does not use nitroglycerin for these symptoms.  Exercise Myoview in 2/12 showed no diagnostic ST changes and soft tissue attenuation without clear ischemia, LVEF 63%.today  We reviewed these results and I discussed likely arranging a follow-up ischemic evaluation around the time of his next visit, sooner if any symptoms escalate.  He has not had a follow-up physical with his primary care provider. No recent lipid panel. He does continue on low-dose simvastatin.  No Known Allergies  Current Outpatient Prescriptions  Medication Sig Dispense Refill  . aspirin 325 MG EC tablet Take 325 mg by mouth daily.      . clopidogrel (PLAVIX) 75 MG tablet TAKE ONE TABLET BY MOUTH ONCE DAILY. 30 tablet 6  . gabapentin (NEURONTIN) 300 MG capsule Take 1 capsule (300 mg total) by mouth 4 (four) times daily. 120 capsule 0  . glimepiride (AMARYL) 4 MG tablet Take 4 mg by mouth daily before breakfast.     . metoprolol tartrate (LOPRESSOR) 25 MG tablet TAKE (1) TABLET BY MOUTH ONCE DAILY. 30 tablet 3  . NITROSTAT 0.4 MG SL tablet DISSOLVE 1 TABLET UNDER TONGUE EVERY 5 MINUTES UP TO 15 MIN FOR CHESTPAIN. IF NO RELIEF CALL 911. 25 tablet 3  . ONGLYZA 5 MG TABS tablet 5 mg daily.     . pantoprazole (PROTONIX) 40 MG tablet Take 1 tablet by mouth daily.     . simvastatin (ZOCOR) 20 MG tablet Take 20 mg by mouth at bedtime.      . traMADol (ULTRAM) 50 MG tablet Take 1 tablet (50 mg total) by mouth every 6 (six) hours as needed for pain. 120 tablet 5  . [DISCONTINUED] topiramate  (TOPAMAX) 25 MG tablet Take 25 mg by mouth daily.      No current facility-administered medications for this visit.    Past Medical History  Diagnosis Date  . Type 2 diabetes mellitus   . Mixed hyperlipidemia   . Myocardial infarction   . GERD (gastroesophageal reflux disease)   . Helicobacter pylori (H. pylori) 09/2009    treated with helidac  . Colon polyp     Hyperplastic rectosigmoid polyp 9/08  . Hiatal hernia   . Coronary atherosclerosis of native coronary artery     DES to PLB, BMS to mid and distal RCA, DES to circumflex and distal LAD, 6/10  . Hyperplastic colon polyp 2008  . Hiatal hernia   . Anal lesion     anal papilla/tags- external lesion    Social History Mr. Pettinger reports that he has been smoking Cigarettes.  He has a 34 pack-year smoking history. He has never used smokeless tobacco. Mr. Meeker reports that he does not drink alcohol.  Review of Systems Complete review of systems negative except as otherwise outlined in the clinical summary and also the following. No palpitations or syncope. No unusual bleeding episodes.  Physical Examination Filed Vitals:   12/14/13 0807  BP: 110/78  Pulse: 95   Filed Weights   12/14/13 0807  Weight: 176 lb (79.833 kg)    Appears comfortable at rest. HEENT:  Conjuctivae and lids normal, oropharynx clear.  Neck: Supple, no elevated JVP, no carotid bruits.  Lungs: Nonlabored breathing at rest. Clear without rales.  Cor: RRR.  No pathologic systolic murmurs. No S3 or rub.  Abd: Soft, nontender. No bruits. Normoactive bowel sounds.  Skin: Warm and dry.  Musculoskeletal: No kyphosis.  Extremities: No pitting edema.  Neuropsychiatric: Alert and oriented x3, affect appropriate.    Problem List and Plan   CORONARY ATHEROSCLEROSIS NATIVE CORONARY ARTERY Symptomatically stable at this time. Plan to continue current medical regimen including aspirin, Plavix, beta blocker, and statin. He has nitroglycerin for use as  needed. We will likely proceed with a follow-up stress test around the time of his next visit in 6 months for ischemic surveillance.  Mixed hyperlipidemia He continues on simvastatin. We will arrange follow-up FLP and LFTs.    Satira Sark, M.D., F.A.C.C.

## 2013-12-14 NOTE — Patient Instructions (Signed)
Your physician wants you to follow-up in: 6 months with Dr. Ferne Reus will receive a reminder letter in the mail two months in advance. If you don't receive a letter, please call our office to schedule the follow-up appointment.  Your physician recommends that you return for lab work Lipids/liver  Your physician recommends that you continue on your current medications as directed. Please refer to the Current Medication list given to you today.  Thank you for choosing [facility]!!  '

## 2014-06-02 ENCOUNTER — Other Ambulatory Visit: Payer: Self-pay | Admitting: Cardiology

## 2014-06-12 ENCOUNTER — Ambulatory Visit (INDEPENDENT_AMBULATORY_CARE_PROVIDER_SITE_OTHER): Payer: BLUE CROSS/BLUE SHIELD | Admitting: Cardiology

## 2014-06-12 ENCOUNTER — Encounter: Payer: Self-pay | Admitting: Cardiology

## 2014-06-12 VITALS — BP 116/72 | HR 96 | Ht 69.0 in | Wt 170.0 lb

## 2014-06-12 DIAGNOSIS — Z136 Encounter for screening for cardiovascular disorders: Secondary | ICD-10-CM | POA: Diagnosis not present

## 2014-06-12 DIAGNOSIS — J438 Other emphysema: Secondary | ICD-10-CM

## 2014-06-12 DIAGNOSIS — Z720 Tobacco use: Secondary | ICD-10-CM

## 2014-06-12 DIAGNOSIS — E785 Hyperlipidemia, unspecified: Secondary | ICD-10-CM | POA: Diagnosis not present

## 2014-06-12 DIAGNOSIS — I2510 Atherosclerotic heart disease of native coronary artery without angina pectoris: Secondary | ICD-10-CM

## 2014-06-12 DIAGNOSIS — E782 Mixed hyperlipidemia: Secondary | ICD-10-CM

## 2014-06-12 DIAGNOSIS — F17200 Nicotine dependence, unspecified, uncomplicated: Secondary | ICD-10-CM

## 2014-06-12 NOTE — Patient Instructions (Signed)
Your physician wants you to follow-up in: 6 ,omnths with Dr Ferne Reus will receive a reminder letter in the mail two months in advance. If you don't receive a letter, please call our office to schedule the follow-up appointment.    Your physician recommends that you continue on your current medications as directed. Please refer to the Current Medication list given to you today.     Please get FASTING lipids blood work   Your physician has requested that you have an adenosine myoview. For further information please visit [email or web address]. Please follow instruction sheet, as given.      Thank you for choosing [facility] !

## 2014-06-12 NOTE — Progress Notes (Signed)
Cardiology Office Note  Date: 06/12/2014   ID: FREMONT SKALICKY, DOB [birth year], MRN [id number]  PCP: Purvis Kilts, MD  Primary Cardiologist: Rozann Lesches, MD   Chief Complaint  Patient presents with  . Coronary Artery Disease    History of Present Illness: Nicholas [name] is a 54 y.o. male last seen in November 2015. He presents for a routine follow-up visit. Continues to work full time. Reports occasional, atypical left lateral thoracic discomfort as noted previously, not suspected to be ischemic in etiology. This has been managed with tramadol by his primary care provider.  Follow-up ECG today shows sinus rhythm with left anterior fascicular block, new compared to previous tracing. Cannot completely exclude inferior infarct pattern.  He is due for follow-up assessment of lipids. We also discussed follow-up ischemic testing. His last evaluation was in 2012.  Past Medical History  Diagnosis Date  . Type 2 diabetes mellitus   . Mixed hyperlipidemia   . Myocardial infarction   . GERD (gastroesophageal reflux disease)   . Helicobacter pylori (H. pylori) 09/2009    Treated with helidac  . Colon polyp     Hyperplastic rectosigmoid polyp 9/08  . Hiatal hernia   . Coronary atherosclerosis of native coronary artery     DES to PLB, BMS to mid and distal RCA, DES to circumflex and distal LAD, 6/10  . Hyperplastic colon polyp 2008  . Hiatal hernia   . Anal lesion     Anal papilla/tags - external lesion    Past Surgical History  Procedure Laterality Date  . Collapsed lung      15 years ago  . 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 Cholecystectomy    . Esophagogastroduodenoscopy  10/12/2006    Dr. Gala Romney- examination of the tubular esophagus revealed no mucosal abnormalities. the EG junction was easily traversed. small hiatal hernia, the gastric mucosa o/w appeared normal. there was no infiltrating process or frank ulcer seen.  . Colonoscopy  10/12/2006    Dr.  Gala Romney- rectosigmoid polyp s/phot snare polypectomy o/w normal rectum and terminal ileum. hyperplastic polyp on bx    Current Outpatient Prescriptions  Medication Sig Dispense Refill  . aspirin 325 MG EC tablet Take 325 mg by mouth daily.      . clopidogrel (PLAVIX) 75 MG tablet TAKE ONE TABLET BY MOUTH ONCE DAILY. 90 tablet 3  . glimepiride (AMARYL) 4 MG tablet Take 4 mg by mouth daily before breakfast.     . metoprolol tartrate (LOPRESSOR) 25 MG tablet TAKE (1) TABLET BY MOUTH ONCE DAILY. 30 tablet 3  . NITROSTAT 0.4 MG SL tablet DISSOLVE 1 TABLET UNDER TONGUE EVERY 5 MINUTES UP TO 15 MIN FOR CHESTPAIN. IF NO RELIEF CALL 911. 25 tablet 3  . ONGLYZA 5 MG TABS tablet 5 mg daily.     . pantoprazole (PROTONIX) 40 MG tablet Take 1 tablet by mouth daily.     . simvastatin (ZOCOR) 20 MG tablet Take 20 mg by mouth at bedtime.      . traMADol (ULTRAM) 50 MG tablet Take 50 mg by mouth every 6 (six) hours as needed.    . [DISCONTINUED] topiramate (TOPAMAX) 25 MG tablet Take 25 mg by mouth daily.      No current facility-administered medications for this visit.    Allergies:  Review of patient's allergies indicates no known allergies.   Social History: The patient  reports that he has been smoking  Cigarettes.  He has a 34 pack-year smoking history. He has never used smokeless tobacco. He reports that he does not drink alcohol or use illicit drugs.   ROS:  Please see the history of present illness. Otherwise, complete review of systems is positive for none.  All other systems are reviewed and negative.   Physical Exam: VS:  BP 116/72 mmHg  Pulse 96  Ht '5\' 9"'$  (1.753 m)  Wt 170 lb (77.111 kg)  BMI 25.09 kg/m2, BMI Body mass index is 25.09 kg/(m^2).  Wt Readings from Last 3 Encounters:  06/12/14 170 lb (77.111 kg)  12/14/13 176 lb (79.833 kg)  06/09/13 173 lb (78.472 kg)     Appears comfortable at rest. HEENT: Conjuctivae and lids normal, oropharynx clear.  Neck: Supple, no elevated JVP, no  carotid bruits.  Lungs: Nonlabored breathing at rest. Clear without rales.  Cor: RRR. No pathologic systolic murmurs. No S3 or rub.  Abd: Soft, nontender. No bruits. Normoactive bowel sounds.  Skin: Warm and dry.  Musculoskeletal: No kyphosis.  Extremities: No pitting edema.  Neuropsychiatric: Alert and oriented x3, affect appropriate.    ECG: ECG is ordered today and shows sinus rhythm with incomplete right bundle branch block as before, left anterior fascicular block which is new. Cannot rule out old inferior infarct pattern.  Other Studies Reviewed Today:  Exercise Myoview in 2/12 showed no diagnostic ST changes and soft tissue attenuation without clear ischemia, LVEF 63%.  ASSESSMENT AND PLAN:  1. CAD status post previous interventions to all major coronary vessels as detailed above. He continues on aspirin and Plavix long-term. ECG shows new left anterior fascicular block, no obvious increasing angina symptoms with baseline activities. His last ischemic workup was in 2012, follow-up Lexiscan Cardiolite will be arranged on medical therapy to reassess ischemic burden.  2. Hyperlipidemia, on Zocor. Follow-up lipid panel will be obtained.  3. Back abuse, have recommended smoking cessation over time, he has not been able to quit.  4. Type 2 diabetes mellitus, followed by Dr. Hilma Favors.  Current medicines were reviewed at length with the patient today.   Orders Placed This Encounter  Procedures  . NM Myocar Multi W/Spect W/Wall Motion / EF  . Lipid Profile  . Myocardial Perfusion Imaging  . EKG 12-Lead    Disposition: FU with me in 6 months.   Signed, Satira Sark, MD, [place] 06/12/2014 1:40 PM    [place] [facility] [address] [place], [place] [address] Phone: [phone]; Fax: [phone]

## 2014-07-18 LAB — LIPID PANEL
Cholesterol: 226 mg/dL — ABNORMAL HIGH (ref 0–200)
HDL: 30 mg/dL — ABNORMAL LOW (ref >=40)
LDL Cholesterol: 133 mg/dL — ABNORMAL HIGH (ref 0–99)
Total CHOL/HDL Ratio: 7.5 Ratio
Triglycerides: 317 mg/dL — ABNORMAL HIGH (ref <150)
VLDL: 63 mg/dL — AB (ref 0–40)

## 2014-07-19 ENCOUNTER — Telehealth: Payer: Self-pay | Admitting: *Deleted

## 2014-07-19 MED ORDER — ATORVASTATIN CALCIUM 40 MG PO TABS: 40.0000 mg | ORAL_TABLET | Freq: Every day | ORAL | Status: DC

## 2014-07-19 NOTE — Telephone Encounter (Signed)
-----   Message from Lendon Colonel, NP sent at 07/18/2014  4:25 PM EDT ----- Cholesterol elevated. Send copy to patient..Begin Lipitor 40 mg daily. Also copy of a low cholesterol diet.

## 2014-07-24 ENCOUNTER — Encounter (HOSPITAL_COMMUNITY)
Admission: RE | Admit: 2014-07-24 | Discharge: 2014-07-24 | Disposition: A | Discharge status: Home / Self Care | Payer: BLUE CROSS/BLUE SHIELD | Source: Ambulatory Visit | Attending: Cardiology | Admitting: Cardiology

## 2014-07-24 ENCOUNTER — Encounter (HOSPITAL_COMMUNITY): Payer: Self-pay

## 2014-07-24 ENCOUNTER — Inpatient Hospital Stay (HOSPITAL_COMMUNITY): Admission: RE | Admit: 2014-07-24 | Payer: BLUE CROSS/BLUE SHIELD | Source: Ambulatory Visit

## 2014-07-24 DIAGNOSIS — J449 Chronic obstructive pulmonary disease, unspecified: Secondary | ICD-10-CM | POA: Insufficient documentation

## 2014-07-24 DIAGNOSIS — Z136 Encounter for screening for cardiovascular disorders: Secondary | ICD-10-CM | POA: Insufficient documentation

## 2014-07-24 LAB — NM MYOCAR MULTI W/SPECT W/WALL MOTION / EF
CHL CUP NUCLEAR SRS: 17
CHL CUP NUCLEAR SSS: 18
LV dias vol: 75 mL
LV sys vol: 30 mL
Peak HR: 116 {beats}/min
RATE: 0.49
Rest HR: 93 {beats}/min
SDS: 1
TID: 1.17

## 2014-07-24 MED ORDER — REGADENOSON 0.4 MG/5ML IV SOLN
INTRAVENOUS | Status: AC
  Administered 2014-07-24: 0.4 mg via INTRAVENOUS
  Filled 2014-07-24: qty 5

## 2014-07-24 MED ORDER — TECHNETIUM TC 99M SESTAMIBI GENERIC - CARDIOLITE
30.0000 | Freq: Once | INTRAVENOUS | Status: AC | PRN
  Administered 2014-07-24: 29 via INTRAVENOUS

## 2014-07-24 MED ORDER — SODIUM CHLORIDE 0.9 % IJ SOLN
INTRAMUSCULAR | Status: AC
  Administered 2014-07-24: 10 mL via INTRAVENOUS
  Filled 2014-07-24: qty 3

## 2014-07-24 MED ORDER — TECHNETIUM TC 99M SESTAMIBI - CARDIOLITE
10.0000 | Freq: Once | INTRAVENOUS | Status: AC | PRN
  Administered 2014-07-24: 10:00:00 9 via INTRAVENOUS

## 2014-12-05 LAB — HEMOGLOBIN A1C: Hgb A1c MFr Bld: 9.1 % — AB (ref 4.0–6.0)

## 2014-12-12 ENCOUNTER — Ambulatory Visit (INDEPENDENT_AMBULATORY_CARE_PROVIDER_SITE_OTHER): Payer: BLUE CROSS/BLUE SHIELD | Admitting: "Endocrinology

## 2014-12-12 ENCOUNTER — Encounter: Payer: Self-pay | Admitting: "Endocrinology

## 2014-12-12 VITALS — BP 128/82 | HR 102 | Ht 69.0 in | Wt 164.0 lb

## 2014-12-12 DIAGNOSIS — I10 Essential (primary) hypertension: Secondary | ICD-10-CM | POA: Insufficient documentation

## 2014-12-12 DIAGNOSIS — E785 Hyperlipidemia, unspecified: Secondary | ICD-10-CM

## 2014-12-12 DIAGNOSIS — E1159 Type 2 diabetes mellitus with other circulatory complications: Secondary | ICD-10-CM | POA: Diagnosis not present

## 2014-12-12 MED ORDER — GLUCOSE BLOOD VI STRP: ORAL_STRIP | Status: DC

## 2014-12-12 MED ORDER — INSULIN PEN NEEDLE 32G X 4 MM MISC: 1.0000 | Freq: Four times a day (QID) | Status: DC

## 2014-12-12 MED ORDER — INSULIN DETEMIR 100 UNIT/ML FLEXPEN: 20.0000 [IU] | PEN_INJECTOR | Freq: Every day | SUBCUTANEOUS | Status: DC

## 2014-12-12 MED ORDER — INSULIN DEGLUDEC 100 UNIT/ML ~~LOC~~ SOPN: 20.0000 [IU] | PEN_INJECTOR | Freq: Every day | SUBCUTANEOUS | Status: DC

## 2014-12-12 NOTE — Patient Instructions (Signed)

## 2014-12-12 NOTE — Progress Notes (Signed)
Subjective:    Patient ID: Nicholas [name], male    DOB: [birth year],    Past Medical History  Diagnosis Date  . Type 2 diabetes mellitus ([place])   . Mixed hyperlipidemia   . Myocardial infarction ([place])   . GERD (gastroesophageal reflux disease)   . Helicobacter pylori (H. pylori) 09/2009    Treated with helidac  . Colon polyp     Hyperplastic rectosigmoid polyp 9/08  . Hiatal hernia   . Coronary atherosclerosis of native coronary artery     DES to PLB, BMS to mid and distal RCA, DES to circumflex and distal LAD, 6/10  . Hyperplastic colon polyp 2008  . Hiatal hernia   . Anal lesion     Anal papilla/tags - external lesion   Past Surgical History  Procedure Laterality Date  . Collapsed lung      15 years ago  . 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 Cholecystectomy    . Esophagogastroduodenoscopy  10/12/2006    Dr. Gala Romney- examination of the tubular esophagus revealed no mucosal abnormalities. the EG junction was easily traversed. small hiatal hernia, the gastric mucosa o/w appeared normal. there was no infiltrating process or frank ulcer seen.  . Colonoscopy  10/12/2006    Dr. Gala Romney- rectosigmoid polyp s/phot snare polypectomy o/w normal rectum and terminal ileum. hyperplastic polyp on bx   Social History   Social History  . Marital Status: Married    Spouse Name: N/A  . Number of Children: N/A  . Years of Education: N/A   Occupational History  . Self-employed    Social History Main Topics  . Smoking status: Light Tobacco Smoker -- 1.00 packs/day for 34 years    Types: Cigarettes  . Smokeless tobacco: Never Used  . Alcohol Use: No  . Drug Use: No  . Sexual Activity:    Partners: Female    Birth Control/ Protection: Pill   Other Topics Concern  . None   Social History Narrative   Outpatient Encounter Prescriptions as of 12/12/2014  Medication Sig  . atorvastatin (LIPITOR) 40 MG tablet Take 1 tablet (40 mg total) by mouth daily.  . canagliflozin  (INVOKANA) 100 MG TABS tablet Take 100 mg by mouth daily before breakfast.  . clopidogrel (PLAVIX) 75 MG tablet TAKE ONE TABLET BY MOUTH ONCE DAILY.  Marland Kitchen glimepiride (AMARYL) 2 MG tablet Take 2 mg by mouth daily with breakfast.  . pantoprazole (PROTONIX) 40 MG tablet Take 1 tablet by mouth daily.   . traMADol (ULTRAM) 50 MG tablet Take 50 mg by mouth every 6 (six) hours as needed.  Marland Kitchen aspirin 325 MG EC tablet Take 325 mg by mouth daily.    Marland Kitchen glucose blood test strip Use as instructed  . Insulin Detemir (LEVEMIR FLEXTOUCH) 100 UNIT/ML Pen Inject 20 Units into the skin daily at 10 pm.  . Insulin Pen Needle (BD PEN NEEDLE NANO U/F) 32G X 4 MM MISC 1 each by Does not apply route 4 (four) times daily.  . metoprolol tartrate (LOPRESSOR) 25 MG tablet TAKE (1) TABLET BY MOUTH ONCE DAILY.  Marland Kitchen NITROSTAT 0.4 MG SL tablet DISSOLVE 1 TABLET UNDER TONGUE EVERY 5 MINUTES UP TO 15 MIN FOR CHESTPAIN. IF NO RELIEF CALL 911.  . simvastatin (ZOCOR) 20 MG tablet Take 20 mg by mouth at bedtime.    . [DISCONTINUED] glimepiride (AMARYL) 4 MG tablet Take 4 mg by mouth daily before breakfast.   . [  DISCONTINUED] ONGLYZA 5 MG TABS tablet 5 mg daily.    No facility-administered encounter medications on file as of 12/12/2014.   ALLERGIES: No Known Allergies VACCINATION STATUS:  There is no immunization history on file for this patient.  Diabetes He presents for his follow-up diabetic visit. He has type 2 diabetes mellitus. Onset time: He was diagnosed at approximate age of 75 years. In this particular patient with a history of heavy alcohol use possiblity of pancreatic diabetes is high. His disease course has been worsening. There are no hypoglycemic associated symptoms. Pertinent negatives for hypoglycemia include no confusion, headaches, pallor or seizures. Associated symptoms include polydipsia and polyuria. Pertinent negatives for diabetes include no chest pain, no fatigue, no polyphagia and no weakness. There are no  hypoglycemic complications. Symptoms are worsening. Diabetic complications include heart disease. Risk factors for coronary artery disease include diabetes mellitus, dyslipidemia, hypertension, male sex, sedentary lifestyle and tobacco exposure. Current diabetic treatment includes oral agent (dual therapy). He is compliant with treatment most of the time. His weight is stable. He is following a generally unhealthy diet. He rarely participates in exercise. Home blood sugar record trend: He does not monitor on a regular basis. Eye exam is current.  Hyperlipidemia This is a chronic problem. The current episode started more than 1 year ago. Pertinent negatives include no chest pain, myalgias or shortness of breath. Current antihyperlipidemic treatment includes statins.  Hypertension This is a chronic problem. The current episode started more than 1 year ago. Pertinent negatives include no chest pain, headaches, neck pain, palpitations or shortness of breath. Past treatments include beta blockers. Hypertensive end-organ damage includes CAD/MI.     Review of Systems  Constitutional: Negative for fatigue and unexpected weight change.  HENT: Negative for dental problem, mouth sores and trouble swallowing.   Eyes: Negative for visual disturbance.  Respiratory: Negative for cough, choking, chest tightness, shortness of breath and wheezing.   Cardiovascular: Negative for chest pain, palpitations and leg swelling.  Gastrointestinal: Negative for nausea, vomiting, abdominal pain, diarrhea, constipation and abdominal distention.  Endocrine: Positive for polydipsia and polyuria. Negative for polyphagia.  Genitourinary: Negative for dysuria, urgency, hematuria and flank pain.  Musculoskeletal: Negative for myalgias, back pain, gait problem and neck pain.  Skin: Negative for pallor, rash and wound.  Neurological: Negative for seizures, syncope, weakness, numbness and headaches.  Psychiatric/Behavioral: Negative.   Negative for confusion and dysphoric mood.    Objective:    BP 128/82 mmHg  Pulse 102  Ht '5\' 9"'$  (1.753 m)  Wt 164 lb (74.39 kg)  BMI 24.21 kg/m2  SpO2 97%  Wt Readings from Last 3 Encounters:  12/12/14 164 lb (74.39 kg)  06/12/14 170 lb (77.111 kg)  12/14/13 176 lb (79.833 kg)    Physical Exam  Constitutional: He is oriented to person, place, and time. He appears well-developed and well-nourished. He is cooperative. No distress.  HENT:  Head: Normocephalic and atraumatic.  Eyes: EOM are normal.  Neck: Normal range of motion. Neck supple. No tracheal deviation present. No thyromegaly present.  Cardiovascular: Normal rate, S1 normal, S2 normal and normal heart sounds.  Exam reveals no gallop.   No murmur heard. Pulses:      Dorsalis pedis pulses are 1+ on the right side, and 1+ on the left side.       Posterior tibial pulses are 1+ on the right side, and 1+ on the left side.  Pulmonary/Chest: Breath sounds normal. No respiratory distress. He has no wheezes.  Abdominal:  Soft. Bowel sounds are normal. He exhibits no distension. There is no tenderness. There is no guarding and no CVA tenderness.  Musculoskeletal: He exhibits no edema.       Right shoulder: He exhibits no swelling and no deformity.  Neurological: He is alert and oriented to person, place, and time. He has normal strength and normal reflexes. No cranial nerve deficit or sensory deficit. Gait normal.  Skin: Skin is warm and dry. No rash noted. No cyanosis. Nails show no clubbing.  Psychiatric: He has a normal mood and affect. His speech is normal and behavior is normal. Judgment and thought content normal. Cognition and memory are normal.    Results for orders placed or performed in visit on 12/12/14  Hemoglobin A1c  Result Value Ref Range   Hgb A1c MFr Bld 9.1 (A) 4.0 - 6.0 %   Complete Blood Count (Most recent): Lab Results  Component Value Date   WBC 7.8 05/24/2009   HGB 14.6 05/24/2009   HCT 41.5 05/24/2009    MCV 78.9 05/24/2009   PLT 156 05/24/2009   Chemistry (most recent): Lab Results  Component Value Date   NA 134* 05/24/2009   K 4.0 05/24/2009   CL 104 05/24/2009   CO2 22 05/24/2009   BUN 11 05/24/2009   CREATININE 0.84 05/24/2009   Diabetic Labs (most recent): Lab Results  Component Value Date   HGBA1C 9.1* 12/05/2014   HGBA1C * 07/02/2008    8.1 (NOTE) The ADA recommends the following therapeutic goal for glycemic control related to Hgb A1c measurement: Goal of therapy: <6.5 Hgb A1c  Reference: American Diabetes Association: Clinical Practice Recommendations 2010, Diabetes Care, 2010, 33: (Suppl  1).   Lipid profile (most recent): Lab Results  Component Value Date   TRIG 317* 07/17/2014   CHOL 226* 07/17/2014         Assessment & Plan:   1. Type 2 diabetes mellitus with vascular disease (HCC) -His diabetes is  complicated by coronary artery disease and patient remains at a high risk for more acute and chronic complications of diabetes which include CAD, CVA, CKD, retinopathy, and neuropathy. These are all discussed in detail with the patient.  Patient came without glucose profile, and  recent A1c increased to 9.1 %. Recent labs reviewed.   - I have re-counseled the patient on diet management  by adopting a carbohydrate restricted / protein rich  Diet.  - Suggestion is made for patient to avoid simple carbohydrates   from their diet including Cakes , Desserts, Ice Cream,  Soda (  diet and regular) , Sweet Tea , Candies,  Chips, Cookies, Artificial Sweeteners,   and "Sugar-free" Products .  This will help patient to have stable blood glucose profile and potentially avoid unintended  Weight gain.  - Patient is advised to stick to a routine mealtimes to eat 3 meals  a day and avoid unnecessary snacks ( to snack only to correct hypoglycemia).  - The patient  will be  scheduled with Jearld Fenton, RDN, CDE for individualized DM education.  - I have approached patient  with the following individualized plan to manage diabetes and patient agrees.  - He will  Need at least a basal  insulin therapy at this time. -Patient will be initiated on Tresiba 20 units qhs, associated with moitoring of BG BID. -He is encouraged to call clinic for blood glucose levels less than 70 or above 200 mg /dl. - I will discontinue his Glimepiride, will continue  Invokana '100mg'$  po qday. -Patient  Is not a candidate for  incretin therapy. -Target numbers for A1c, LDL, HDL, Triglycerides, Waist Circumference were discussed in detail.   2) BP/HTN: Controlled. Continue current medications including beta blockers. 3) Lipids/HPL:  continue statins. 4)  Weight/Diet: CDE consult in progress, exercise, and carbohydrates information provided.  5) Chronic Care/Health Maintenance:  -Patient is  Statin medications and encouraged to continue to follow up with Ophthalmology, Podiatrist at least yearly or according to recommendations, and advised to quit smoking. I have recommended yearly flu vaccine and pneumonia vaccination at least every 5 years; moderate intensity exercise for up to 150 minutes weekly; and  sleep for at least 7 hours a day.  I advised patient to maintain close follow up with their PCP for primary care needs.  Patient is asked to bring meter and  blood glucose logs during their next visit.   Follow up plan: Return in about 3 months (around 03/14/2015) for diabetes, high blood pressure, high cholesterol, underactive thyroid, follow up with pre-visit labs, meter, and logs.  Glade Lloyd, MD Phone: [phone]  Fax: [phone]   12/12/2014, 8:59 AM

## 2015-01-19 ENCOUNTER — Other Ambulatory Visit: Payer: Self-pay | Admitting: "Endocrinology

## 2015-02-06 ENCOUNTER — Ambulatory Visit: Payer: BLUE CROSS/BLUE SHIELD | Admitting: Internal Medicine

## 2015-03-15 ENCOUNTER — Encounter: Payer: Self-pay | Admitting: "Endocrinology

## 2015-03-15 ENCOUNTER — Ambulatory Visit (INDEPENDENT_AMBULATORY_CARE_PROVIDER_SITE_OTHER): Payer: BLUE CROSS/BLUE SHIELD | Admitting: "Endocrinology

## 2015-03-15 ENCOUNTER — Other Ambulatory Visit: Payer: Self-pay | Admitting: "Endocrinology

## 2015-03-15 DIAGNOSIS — E785 Hyperlipidemia, unspecified: Secondary | ICD-10-CM

## 2015-03-15 DIAGNOSIS — I10 Essential (primary) hypertension: Secondary | ICD-10-CM | POA: Diagnosis not present

## 2015-03-15 DIAGNOSIS — E1159 Type 2 diabetes mellitus with other circulatory complications: Secondary | ICD-10-CM | POA: Diagnosis not present

## 2015-03-15 LAB — HEMOGLOBIN A1C
HEMOGLOBIN A1C: 9.5 % — AB (ref <5.7)
MEAN PLASMA GLUCOSE: 226 mg/dL — AB (ref <117)

## 2015-03-15 LAB — BASIC METABOLIC PANEL
BUN: 18 mg/dL (ref 7–25)
CO2: 27 mmol/L (ref 20–31)
CREATININE: 0.9 mg/dL (ref 0.70–1.33)
Calcium: 9.5 mg/dL (ref 8.6–10.3)
Chloride: 103 mmol/L (ref 98–110)
GLUCOSE: 259 mg/dL — AB (ref 65–99)
Potassium: 5 mmol/L (ref 3.5–5.3)
Sodium: 138 mmol/L (ref 135–146)

## 2015-03-15 MED ORDER — INSULIN DEGLUDEC 100 UNIT/ML ~~LOC~~ SOPN: 20.0000 [IU] | PEN_INJECTOR | Freq: Every day | SUBCUTANEOUS | Status: DC

## 2015-03-15 NOTE — Progress Notes (Signed)
Subjective:    Patient ID: Nicholas [name], male    DOB: [birth year],    Past Medical History  Diagnosis Date  . Type 2 diabetes mellitus ([place])   . Mixed hyperlipidemia   . Myocardial infarction ([place])   . GERD (gastroesophageal reflux disease)   . Helicobacter pylori (H. pylori) 09/2009    Treated with helidac  . Colon polyp     Hyperplastic rectosigmoid polyp 9/08  . Hiatal hernia   . Coronary atherosclerosis of native coronary artery     DES to PLB, BMS to mid and distal RCA, DES to circumflex and distal LAD, 6/10  . Hyperplastic colon polyp 2008  . Hiatal hernia   . Anal lesion     Anal papilla/tags - external lesion   Past Surgical History  Procedure Laterality Date  . Collapsed lung      15 years ago  . 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 Cholecystectomy    . Esophagogastroduodenoscopy  10/12/2006    Dr. Gala Romney- examination of the tubular esophagus revealed no mucosal abnormalities. the EG junction was easily traversed. small hiatal hernia, the gastric mucosa o/w appeared normal. there was no infiltrating process or frank ulcer seen.  . Colonoscopy  10/12/2006    Dr. Gala Romney- rectosigmoid polyp s/phot snare polypectomy o/w normal rectum and terminal ileum. hyperplastic polyp on bx   Social History   Social History  . Marital Status: Married    Spouse Name: N/A  . Number of Children: N/A  . Years of Education: N/A   Occupational History  . Self-employed    Social History Main Topics  . Smoking status: Light Tobacco Smoker -- 1.00 packs/day for 34 years    Types: Cigarettes  . Smokeless tobacco: Never Used  . Alcohol Use: No  . Drug Use: No  . Sexual Activity:    Partners: Female    Birth Control/ Protection: Pill   Other Topics Concern  . None   Social History Narrative   Outpatient Encounter Prescriptions as of 03/15/2015  Medication Sig  . aspirin 325 MG EC tablet Take 325 mg by mouth daily.    Marland Kitchen atorvastatin (LIPITOR) 40 MG tablet Take  1 tablet (40 mg total) by mouth daily.  . clopidogrel (PLAVIX) 75 MG tablet TAKE ONE TABLET BY MOUTH ONCE DAILY.  Marland Kitchen INVOKANA 100 MG TABS tablet TAKE 1 TABLET BY MOUTH ONCE A DAY.  . metoprolol tartrate (LOPRESSOR) 25 MG tablet TAKE (1) TABLET BY MOUTH ONCE DAILY.  Marland Kitchen NITROSTAT 0.4 MG SL tablet DISSOLVE 1 TABLET UNDER TONGUE EVERY 5 MINUTES UP TO 15 MIN FOR CHESTPAIN. IF NO RELIEF CALL 911.  . pantoprazole (PROTONIX) 40 MG tablet Take 1 tablet by mouth daily.   . traMADol (ULTRAM) 50 MG tablet Take 50 mg by mouth every 6 (six) hours as needed.  . [DISCONTINUED] glimepiride (AMARYL) 4 MG tablet Take 4 mg by mouth daily with breakfast.  . glucose blood test strip Use as instructed  . Insulin Degludec (TRESIBA FLEXTOUCH) 100 UNIT/ML SOPN Inject 20 Units into the skin at bedtime.  . [DISCONTINUED] Insulin Degludec (TRESIBA FLEXTOUCH) 100 UNIT/ML SOPN Inject 20 Units into the skin at bedtime. (Patient not taking: Reported on 03/15/2015)  . [DISCONTINUED] simvastatin (ZOCOR) 20 MG tablet Take 20 mg by mouth at bedtime.     No facility-administered encounter medications on file as of 03/15/2015.   ALLERGIES: No Known Allergies VACCINATION STATUS:  There  is no immunization history on file for this patient.  Diabetes He presents for his follow-up diabetic visit. He has type 2 diabetes mellitus. Onset time: He was diagnosed at approximate age of 74 years. In this particular patient with a history of heavy alcohol use possiblity of pancreatic diabetes is high. His disease course has been worsening. There are no hypoglycemic associated symptoms. Pertinent negatives for hypoglycemia include no confusion, headaches, pallor or seizures. Associated symptoms include polydipsia and polyuria. Pertinent negatives for diabetes include no chest pain, no fatigue, no polyphagia and no weakness. There are no hypoglycemic complications. Symptoms are worsening. Diabetic complications include heart disease. Risk factors for  coronary artery disease include diabetes mellitus, dyslipidemia, hypertension, male sex, sedentary lifestyle and tobacco exposure. Current diabetic treatment includes oral agent (dual therapy). He is compliant with treatment none of the time. His weight is stable. He is following a generally unhealthy diet. When asked about meal planning, he reported none. He has not had a previous visit with a dietitian (he did not keep appointment.). He rarely participates in exercise. Home blood sugar record trend: He did not monitor blood glucose, he did not take the insulin I prescribed, he resumed glimepiride which I discontinued. Eye exam is current.  Hyperlipidemia This is a chronic problem. The current episode started more than 1 year ago. Pertinent negatives include no chest pain, myalgias or shortness of breath. Current antihyperlipidemic treatment includes statins.  Hypertension This is a chronic problem. The current episode started more than 1 year ago. Pertinent negatives include no chest pain, headaches, neck pain, palpitations or shortness of breath. Past treatments include beta blockers. Hypertensive end-organ damage includes CAD/MI.     Review of Systems  Constitutional: Negative for fatigue and unexpected weight change.  HENT: Negative for dental problem, mouth sores and trouble swallowing.   Eyes: Negative for visual disturbance.  Respiratory: Negative for cough, choking, chest tightness, shortness of breath and wheezing.   Cardiovascular: Negative for chest pain, palpitations and leg swelling.  Gastrointestinal: Negative for nausea, vomiting, abdominal pain, diarrhea, constipation and abdominal distention.  Endocrine: Positive for polydipsia and polyuria. Negative for polyphagia.  Genitourinary: Negative for dysuria, urgency, hematuria and flank pain.  Musculoskeletal: Negative for myalgias, back pain, gait problem and neck pain.  Skin: Negative for pallor, rash and wound.  Neurological:  Negative for seizures, syncope, weakness, numbness and headaches.  Psychiatric/Behavioral: Negative.  Negative for confusion and dysphoric mood.    Objective:    There were no vitals taken for this visit.  Wt Readings from Last 3 Encounters:  12/12/14 164 lb (74.39 kg)  06/12/14 170 lb (77.111 kg)  12/14/13 176 lb (79.833 kg)    Physical Exam  Constitutional: He is oriented to person, place, and time. He appears well-developed and well-nourished. He is cooperative. No distress.  HENT:  Head: Normocephalic and atraumatic.  Eyes: EOM are normal.  Neck: Normal range of motion. Neck supple. No tracheal deviation present. No thyromegaly present.  Cardiovascular: Normal rate, S1 normal, S2 normal and normal heart sounds.  Exam reveals no gallop.   No murmur heard. Pulses:      Dorsalis pedis pulses are 1+ on the right side, and 1+ on the left side.       Posterior tibial pulses are 1+ on the right side, and 1+ on the left side.  Pulmonary/Chest: Breath sounds normal. No respiratory distress. He has no wheezes.  Abdominal: Soft. Bowel sounds are normal. He exhibits no distension. There is no tenderness.  There is no guarding and no CVA tenderness.  Musculoskeletal: He exhibits no edema.       Right shoulder: He exhibits no swelling and no deformity.  Neurological: He is alert and oriented to person, place, and time. He has normal strength and normal reflexes. No cranial nerve deficit or sensory deficit. Gait normal.  Skin: Skin is warm and dry. No rash noted. No cyanosis. Nails show no clubbing.  Psychiatric: He has a normal mood and affect. His speech is normal and behavior is normal. Judgment and thought content normal. Cognition and memory are normal.    Results for orders placed or performed in visit on 12/12/14  Hemoglobin A1c  Result Value Ref Range   Hgb A1c MFr Bld 9.1 (A) 4.0 - 6.0 %   Diabetic Labs (most recent): Lab Results  Component Value Date   HGBA1C 9.1* 12/05/2014    HGBA1C * 07/02/2008    8.1 (NOTE) The ADA recommends the following therapeutic goal for glycemic control related to Hgb A1c measurement: Goal of therapy: <6.5 Hgb A1c  Reference: American Diabetes Association: Clinical Practice Recommendations 2010, Diabetes Care, 2010, 33: (Suppl  1).   Lipid Panel     Component Value Date/Time   CHOL 226* 07/17/2014 0939   TRIG 317* 07/17/2014 0939   HDL 30* 07/17/2014 0939   CHOLHDL 7.5 07/17/2014 0939   VLDL 63* 07/17/2014 0939   LDLCALC 133* 07/17/2014 0939      Assessment & Plan:   1. Type 2 diabetes mellitus with vascular disease (HCC) -His diabetes is  complicated by coronary artery disease and patient remains at a high risk for more acute and chronic complications of diabetes which include CAD, CVA, CKD, retinopathy, and neuropathy. These are all discussed in detail with the patient. -Unfortunately he is noncompliant, I have long discussion with him about the need for more engagement. --Prior to his last visit he is A1c was increasing to  9.1 %.  -  No recent labs to review. I will send him to lab today. - I have re-counseled the patient on diet management  by adopting a carbohydrate restricted / protein rich  Diet.  - Suggestion is made for patient to avoid simple carbohydrates   from their diet including Cakes , Desserts, Ice Cream,  Soda (  diet and regular) , Sweet Tea , Candies,  Chips, Cookies, Artificial Sweeteners,   and "Sugar-free" Products .  This will help patient to have stable blood glucose profile and potentially avoid unintended  Weight gain.  - Patient is advised to stick to a routine mealtimes to eat 3 meals  a day and avoid unnecessary snacks ( to snack only to correct hypoglycemia).  - The patient  will be  scheduled with Jearld Fenton, RDN, CDE for individualized DM education.  - I have approached patient with the following individualized plan to manage diabetes and patient agrees.  -Patient is alarmingly noncompliant  and unconcerned. -Patient came without the meter nor glucose profile. -He did not do his previsit labs. -He did not start the insulin I prescribed for him last visit. -He resumed the glimepiride which I discontinued last visit on his own. - I re-approached him with the parents and he promises this time he'll do it.  - He will  Need at least a basal  insulin therapy at this time. -Patient will be initiated on Tresiba 20 units qhs, associated with moitoring of BG   before meals and at bedtime.  -He is encouraged  to call clinic for blood glucose levels less than 70 or above 200 mg /dl. - I will discontinue his Glimepiride, will continue  Invokana '100mg'$  po qday. -Patient  Is not a candidate for  incretin therapy. -Target numbers for A1c, LDL, HDL, Triglycerides, Waist Circumference were discussed in detail.   2) BP/HTN: Controlled. Continue current medications including beta blockers. 3) Lipids/HPL:  continue statins. 4)  Weight/Diet:   he missed his CDE consult, exercise, and carbohydrates information provided.  5) Chronic Care/Health Maintenance:  -Patient is  Statin medications and encouraged to continue to follow up with Ophthalmology, Podiatrist at least yearly or according to recommendations, and advised to quit smoking. I have recommended yearly flu vaccine and pneumonia vaccination at least every 5 years; moderate intensity exercise for up to 150 minutes weekly; and  sleep for at least 7 hours a day.  I advised patient to maintain close follow up with their PCP for primary care needs.  Patient is asked to bring meter and  blood glucose logs during their next visit.   Follow up plan: Return in about 1 week (around 03/22/2015) for diabetes, high blood pressure, high cholesterol, labs today.  Glade Lloyd, MD Phone: [phone]  Fax: [phone]   03/15/2015, 8:47 AM

## 2015-03-16 ENCOUNTER — Telehealth: Payer: Self-pay | Admitting: "Endocrinology

## 2015-03-16 MED ORDER — GLUCOSE BLOOD VI STRP: ORAL_STRIP | Status: DC

## 2015-03-16 NOTE — Telephone Encounter (Signed)
Pt needs strips called into [place] for Nicholas [name] 2

## 2015-03-26 ENCOUNTER — Ambulatory Visit (INDEPENDENT_AMBULATORY_CARE_PROVIDER_SITE_OTHER): Payer: BLUE CROSS/BLUE SHIELD | Admitting: "Endocrinology

## 2015-03-26 ENCOUNTER — Encounter: Payer: Self-pay | Admitting: "Endocrinology

## 2015-03-26 VITALS — BP 106/69 | HR 90 | Ht 69.0 in | Wt 162.0 lb

## 2015-03-26 DIAGNOSIS — E1159 Type 2 diabetes mellitus with other circulatory complications: Secondary | ICD-10-CM | POA: Diagnosis not present

## 2015-03-26 DIAGNOSIS — E785 Hyperlipidemia, unspecified: Secondary | ICD-10-CM

## 2015-03-26 DIAGNOSIS — I10 Essential (primary) hypertension: Secondary | ICD-10-CM | POA: Diagnosis not present

## 2015-03-26 MED ORDER — INSULIN DEGLUDEC 100 UNIT/ML ~~LOC~~ SOPN: 30.0000 [IU] | PEN_INJECTOR | Freq: Every day | SUBCUTANEOUS | Status: DC

## 2015-03-26 NOTE — Progress Notes (Signed)
Subjective:    Patient ID: Nicholas [name], male    DOB: 06/09/1960,    Past Medical History  Diagnosis Date  . Type 2 diabetes mellitus ([place])   . Mixed hyperlipidemia   . Myocardial infarction ([place])   . GERD (gastroesophageal reflux disease)   . Helicobacter pylori (H. pylori) 09/2009    Treated with helidac  . Colon polyp     Hyperplastic rectosigmoid polyp 9/08  . Hiatal hernia   . Coronary atherosclerosis of native coronary artery     DES to PLB, BMS to mid and distal RCA, DES to circumflex and distal LAD, 6/10  . Hyperplastic colon polyp 2008  . Hiatal hernia   . Anal lesion     Anal papilla/tags - external lesion   Past Surgical History  Procedure Laterality Date  . Collapsed lung      15 years ago  . 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 Cholecystectomy    . Esophagogastroduodenoscopy  10/12/2006    Dr. Gala Romney- examination of the tubular esophagus revealed no mucosal abnormalities. the EG junction was easily traversed. small hiatal hernia, the gastric mucosa o/w appeared normal. there was no infiltrating process or frank ulcer seen.  . Colonoscopy  10/12/2006    Dr. Gala Romney- rectosigmoid polyp s/phot snare polypectomy o/w normal rectum and terminal ileum. hyperplastic polyp on bx   Social History   Social History  . Marital Status: Married    Spouse Name: N/A  . Number of Children: N/A  . Years of Education: N/A   Occupational History  . Self-employed    Social History Main Topics  . Smoking status: Light Tobacco Smoker -- 1.00 packs/day for 34 years    Types: Cigarettes  . Smokeless tobacco: Never Used  . Alcohol Use: No  . Drug Use: No  . Sexual Activity:    Partners: Female    Birth Control/ Protection: Pill   Other Topics Concern  . None   Social History Narrative   Outpatient Encounter Prescriptions as of 03/26/2015  Medication Sig  . aspirin 325 MG EC tablet Take 325 mg by mouth daily.    Marland Kitchen atorvastatin (LIPITOR) 40 MG tablet Take  1 tablet (40 mg total) by mouth daily.  . clopidogrel (PLAVIX) 75 MG tablet TAKE ONE TABLET BY MOUTH ONCE DAILY.  Marland Kitchen glucose blood test strip Use as instructed 2 times daily. E11.65 Breeze 2 test strips  . Insulin Degludec (TRESIBA FLEXTOUCH) 100 UNIT/ML SOPN Inject 30 Units into the skin at bedtime.  . metoprolol tartrate (LOPRESSOR) 25 MG tablet TAKE (1) TABLET BY MOUTH ONCE DAILY.  Marland Kitchen NITROSTAT 0.4 MG SL tablet DISSOLVE 1 TABLET UNDER TONGUE EVERY 5 MINUTES UP TO 15 MIN FOR CHESTPAIN. IF NO RELIEF CALL 911.  . pantoprazole (PROTONIX) 40 MG tablet Take 1 tablet by mouth daily.   . traMADol (ULTRAM) 50 MG tablet Take 50 mg by mouth every 6 (six) hours as needed.  . [DISCONTINUED] Insulin Degludec (TRESIBA FLEXTOUCH) 100 UNIT/ML SOPN Inject 20 Units into the skin at bedtime.  . [DISCONTINUED] INVOKANA 100 MG TABS tablet TAKE 1 TABLET BY MOUTH ONCE A DAY.   No facility-administered encounter medications on file as of 03/26/2015.   ALLERGIES: No Known Allergies VACCINATION STATUS:  There is no immunization history on file for this patient.  Diabetes He presents for his follow-up diabetic visit. He has type 2 diabetes mellitus. Onset time: He was diagnosed at  approximate age of 70 years. In this particular patient with a history of heavy alcohol use possiblity of pancreatic diabetes is high. His disease course has been improving. There are no hypoglycemic associated symptoms. Pertinent negatives for hypoglycemia include no confusion, headaches, pallor or seizures. Pertinent negatives for diabetes include no chest pain, no fatigue, no polydipsia, no polyphagia, no polyuria and no weakness. There are no hypoglycemic complications. Symptoms are improving. Diabetic complications include heart disease. Risk factors for coronary artery disease include diabetes mellitus, dyslipidemia, hypertension, male sex, sedentary lifestyle and tobacco exposure. Current diabetic treatment includes oral agent (dual  therapy). He is compliant with treatment none of the time. His weight is decreasing steadily. He is following a generally unhealthy diet. When asked about meal planning, he reported none. He has not had a previous visit with a dietitian (he did not keep appointment.). He rarely participates in exercise. Home blood sugar record trend: He did not monitor blood glucose, he did not take the insulin I prescribed, he resumed glimepiride which I discontinued. His breakfast blood glucose range is generally 140-180 mg/dl. His lunch blood glucose range is generally 140-180 mg/dl. His dinner blood glucose range is generally 140-180 mg/dl. His overall blood glucose range is 140-180 mg/dl. Eye exam is current.  Hyperlipidemia This is a chronic problem. The current episode started more than 1 year ago. Pertinent negatives include no chest pain, myalgias or shortness of breath. Current antihyperlipidemic treatment includes statins.  Hypertension This is a chronic problem. The current episode started more than 1 year ago. Pertinent negatives include no chest pain, headaches, neck pain, palpitations or shortness of breath. Past treatments include beta blockers. Hypertensive end-organ damage includes CAD/MI.     Review of Systems  Constitutional: Negative for fatigue and unexpected weight change.  HENT: Negative for dental problem, mouth sores and trouble swallowing.   Eyes: Negative for visual disturbance.  Respiratory: Negative for cough, choking, chest tightness, shortness of breath and wheezing.   Cardiovascular: Negative for chest pain, palpitations and leg swelling.  Gastrointestinal: Negative for nausea, vomiting, abdominal pain, diarrhea, constipation and abdominal distention.  Endocrine: Negative for polydipsia, polyphagia and polyuria.  Genitourinary: Negative for dysuria, urgency, hematuria and flank pain.  Musculoskeletal: Negative for myalgias, back pain, gait problem and neck pain.  Skin: Negative for  pallor, rash and wound.  Neurological: Negative for seizures, syncope, weakness, numbness and headaches.  Psychiatric/Behavioral: Negative.  Negative for confusion and dysphoric mood.    Objective:    BP 106/69 mmHg  Pulse 90  Ht '5\' 9"'$  (1.753 m)  Wt 162 lb (73.483 kg)  BMI 23.91 kg/m2  SpO2 98%  Wt Readings from Last 3 Encounters:  03/26/15 162 lb (73.483 kg)  12/12/14 164 lb (74.39 kg)  06/12/14 170 lb (77.111 kg)    Physical Exam  Constitutional: He is oriented to person, place, and time. He appears well-developed and well-nourished. He is cooperative. No distress.  HENT:  Head: Normocephalic and atraumatic.  Eyes: EOM are normal.  Neck: Normal range of motion. Neck supple. No tracheal deviation present. No thyromegaly present.  Cardiovascular: Normal rate, S1 normal, S2 normal and normal heart sounds.  Exam reveals no gallop.   No murmur heard. Pulses:      Dorsalis pedis pulses are 1+ on the right side, and 1+ on the left side.       Posterior tibial pulses are 1+ on the right side, and 1+ on the left side.  Pulmonary/Chest: Breath sounds normal. No respiratory distress.  He has no wheezes.  Abdominal: Soft. Bowel sounds are normal. He exhibits no distension. There is no tenderness. There is no guarding and no CVA tenderness.  Musculoskeletal: He exhibits no edema.       Right shoulder: He exhibits no swelling and no deformity.  Neurological: He is alert and oriented to person, place, and time. He has normal strength and normal reflexes. No cranial nerve deficit or sensory deficit. Gait normal.  Skin: Skin is warm and dry. No rash noted. No cyanosis. Nails show no clubbing.  Psychiatric: He has a normal mood and affect. His speech is normal and behavior is normal. Judgment and thought content normal. Cognition and memory are normal.    Results for orders placed or performed in visit on 93/81/01  Basic metabolic panel  Result Value Ref Range   Sodium 138 135 - 146 mmol/L    Potassium 5.0 3.5 - 5.3 mmol/L   Chloride 103 98 - 110 mmol/L   CO2 27 20 - 31 mmol/L   Glucose, Bld 259 (H) 65 - 99 mg/dL   BUN 18 7 - 25 mg/dL   Creat 0.90 0.70 - 1.33 mg/dL   Calcium 9.5 8.6 - 10.3 mg/dL  Hemoglobin A1c  Result Value Ref Range   Hgb A1c MFr Bld 9.5 (H) <5.7 %   Mean Plasma Glucose 226 (H) <117 mg/dL   Diabetic Labs (most recent): Lab Results  Component Value Date   HGBA1C 9.5* 03/15/2015   HGBA1C 9.1* 12/05/2014   HGBA1C * 07/02/2008    8.1 (NOTE) The ADA recommends the following therapeutic goal for glycemic control related to Hgb A1c measurement: Goal of therapy: <6.5 Hgb A1c  Reference: American Diabetes Association: Clinical Practice Recommendations 2010, Diabetes Care, 2010, 33: (Suppl  1).   Lipid Panel     Component Value Date/Time   CHOL 226* 07/17/2014 0939   TRIG 317* 07/17/2014 0939   HDL 30* 07/17/2014 0939   CHOLHDL 7.5 07/17/2014 0939   VLDL 63* 07/17/2014 0939   LDLCALC 133* 07/17/2014 0939      Assessment & Plan:   1. Type 2 diabetes mellitus with vascular disease (HCC) -His diabetes is  complicated by coronary artery disease and patient remains at a high risk for more acute and chronic complications of diabetes which include CAD, CVA, CKD, retinopathy, and neuropathy. These are all discussed in detail with the patient. -he complied better. --His a1c is higher at 9.5%, he is losing weight.  - I have re-counseled the patient on diet management  by adopting a carbohydrate restricted / protein rich  Diet.  - Suggestion is made for patient to avoid simple carbohydrates   from their diet including Cakes , Desserts, Ice Cream,  Soda (  diet and regular) , Sweet Tea , Candies,  Chips, Cookies, Artificial Sweeteners,   and "Sugar-free" Products .  This will help patient to have stable blood glucose profile and potentially avoid unintended  Weight gain.  - Patient is advised to stick to a routine mealtimes to eat 3 meals  a day and avoid  unnecessary snacks ( to snack only to correct hypoglycemia).  - The patient  will be  scheduled with Jearld Fenton, RDN, CDE for individualized DM education.  - I have approached patient with the following individualized plan to manage diabetes and patient agrees.  - He will  Not need prandial insulin for now. - Asked him to increase Tresiba to 30 units at bedtime  associated with moitoring of  BG   at breakfast and at bedtime.   -He is encouraged to call clinic for blood glucose levels less than 70 or above 200 mg /dl. - I will discontinue his   Invokana due to weight loss, unintentional.  -Patient  Is not a candidate for  incretin therapy. -Target numbers for A1c, LDL, HDL, Triglycerides, Waist Circumference were discussed in detail.   2) BP/HTN: Controlled. Continue current medications including beta blockers. 3) Lipids/HPL:  continue statins. 4)  Weight/Diet:   he missed his CDE consult, exercise, and carbohydrates information provided.  5) Chronic Care/Health Maintenance:  -Patient is  Statin medications and encouraged to continue to follow up with Ophthalmology, Podiatrist at least yearly or according to recommendations, and advised to quit smoking. I have recommended yearly flu vaccine and pneumonia vaccination at least every 5 years; moderate intensity exercise for up to 150 minutes weekly; and  sleep for at least 7 hours a day.  I advised patient to maintain close follow up with their PCP for primary care needs.  Patient is asked to bring meter and  blood glucose logs during their next visit.   Follow up plan: Return in about 4 weeks (around 04/23/2015) for diabetes, high blood pressure, high cholesterol, follow up with meter and logs- no labs.  Glade Lloyd, MD Phone: [phone]  Fax: [phone]   03/26/2015, 9:01 AM

## 2015-04-03 ENCOUNTER — Telehealth: Payer: Self-pay

## 2015-04-03 NOTE — Telephone Encounter (Signed)
Pt states he has high readings. He did not have his reading at the time he called. He will call back with these.

## 2015-04-24 ENCOUNTER — Encounter: Payer: Self-pay | Admitting: "Endocrinology

## 2015-04-24 ENCOUNTER — Ambulatory Visit (INDEPENDENT_AMBULATORY_CARE_PROVIDER_SITE_OTHER): Payer: BLUE CROSS/BLUE SHIELD | Admitting: "Endocrinology

## 2015-04-24 VITALS — BP 113/73 | HR 96 | Ht 69.0 in | Wt 164.0 lb

## 2015-04-24 DIAGNOSIS — E1159 Type 2 diabetes mellitus with other circulatory complications: Secondary | ICD-10-CM

## 2015-04-24 DIAGNOSIS — E785 Hyperlipidemia, unspecified: Secondary | ICD-10-CM | POA: Diagnosis not present

## 2015-04-24 DIAGNOSIS — I10 Essential (primary) hypertension: Secondary | ICD-10-CM

## 2015-04-24 NOTE — Progress Notes (Signed)
Subjective:    Patient ID: Nicholas [name], male    DOB: [birth year],    Past Medical History  Diagnosis Date  . Type 2 diabetes mellitus ([place])   . Mixed hyperlipidemia   . Myocardial infarction ([place])   . GERD (gastroesophageal reflux disease)   . Helicobacter pylori (H. pylori) 09/2009    Treated with helidac  . Colon polyp     Hyperplastic rectosigmoid polyp 9/08  . Hiatal hernia   . Coronary atherosclerosis of native coronary artery     DES to PLB, BMS to mid and distal RCA, DES to circumflex and distal LAD, 6/10  . Hyperplastic colon polyp 2008  . Hiatal hernia   . Anal lesion     Anal papilla/tags - external lesion   Past Surgical History  Procedure Laterality Date  . Collapsed lung      15 years ago  . 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 Cholecystectomy    . Esophagogastroduodenoscopy  10/12/2006    Dr. Gala Romney- examination of the tubular esophagus revealed no mucosal abnormalities. the EG junction was easily traversed. small hiatal hernia, the gastric mucosa o/w appeared normal. there was no infiltrating process or frank ulcer seen.  . Colonoscopy  10/12/2006    Dr. Gala Romney- rectosigmoid polyp s/phot snare polypectomy o/w normal rectum and terminal ileum. hyperplastic polyp on bx   Social History   Social History  . Marital Status: Married    Spouse Name: N/A  . Number of Children: N/A  . Years of Education: N/A   Occupational History  . Self-employed    Social History Main Topics  . Smoking status: Light Tobacco Smoker -- 1.00 packs/day for 34 years    Types: Cigarettes  . Smokeless tobacco: Never Used  . Alcohol Use: No  . Drug Use: No  . Sexual Activity:    Partners: Female    Birth Control/ Protection: Pill   Other Topics Concern  . Not on file   Social History Narrative   Outpatient Encounter Prescriptions as of 04/24/2015  Medication Sig  . aspirin 325 MG EC tablet Take 325 mg by mouth daily.    Marland Kitchen atorvastatin (LIPITOR) 40 MG  tablet Take 1 tablet (40 mg total) by mouth daily.  . clopidogrel (PLAVIX) 75 MG tablet TAKE ONE TABLET BY MOUTH ONCE DAILY.  Marland Kitchen Insulin Degludec 100 UNIT/ML SOPN Inject 40 Units into the skin at bedtime.  . metoprolol tartrate (LOPRESSOR) 25 MG tablet TAKE (1) TABLET BY MOUTH ONCE DAILY.  Marland Kitchen NITROSTAT 0.4 MG SL tablet DISSOLVE 1 TABLET UNDER TONGUE EVERY 5 MINUTES UP TO 15 MIN FOR CHESTPAIN. IF NO RELIEF CALL 911.  . pantoprazole (PROTONIX) 40 MG tablet Take 1 tablet by mouth daily.   . traMADol (ULTRAM) 50 MG tablet Take 50 mg by mouth every 6 (six) hours as needed.  . [DISCONTINUED] Insulin Degludec (TRESIBA FLEXTOUCH) 100 UNIT/ML SOPN Inject 30 Units into the skin at bedtime.  Marland Kitchen glucose blood test strip Use as instructed 2 times daily. E11.65 Breeze 2 test strips   No facility-administered encounter medications on file as of 04/24/2015.   ALLERGIES: No Known Allergies VACCINATION STATUS:  There is no immunization history on file for this patient.  Diabetes He presents for his follow-up diabetic visit. He has type 2 diabetes mellitus. Onset time: He was diagnosed at approximate age of 20 years. In this particular patient with a history of heavy alcohol use  possiblity of pancreatic diabetes is high. His disease course has been improving. There are no hypoglycemic associated symptoms. Pertinent negatives for hypoglycemia include no confusion, headaches, pallor or seizures. Pertinent negatives for diabetes include no chest pain, no fatigue, no polydipsia, no polyphagia, no polyuria and no weakness. There are no hypoglycemic complications. Symptoms are improving. Diabetic complications include heart disease. Risk factors for coronary artery disease include diabetes mellitus, dyslipidemia, hypertension, male sex, sedentary lifestyle and tobacco exposure. Current diabetic treatment includes oral agent (dual therapy). He is compliant with treatment none of the time. His weight is increasing steadily. He  is following a generally unhealthy diet. When asked about meal planning, he reported none. He has not had a previous visit with a dietitian (he did not keep appointment.). He rarely participates in exercise. Home blood sugar record trend: He did not monitor blood glucose, he did not take the insulin I prescribed, he resumed glimepiride which I discontinued. His breakfast blood glucose range is generally 140-180 mg/dl. His dinner blood glucose range is generally 180-200 mg/dl. Eye exam is current.  Hyperlipidemia This is a chronic problem. The current episode started more than 1 year ago. Pertinent negatives include no chest pain, myalgias or shortness of breath. Current antihyperlipidemic treatment includes statins.  Hypertension This is a chronic problem. The current episode started more than 1 year ago. Pertinent negatives include no chest pain, headaches, neck pain, palpitations or shortness of breath. Past treatments include beta blockers. Hypertensive end-organ damage includes CAD/MI.     Review of Systems  Constitutional: Negative for fatigue and unexpected weight change.  HENT: Negative for dental problem, mouth sores and trouble swallowing.   Eyes: Negative for visual disturbance.  Respiratory: Negative for cough, choking, chest tightness, shortness of breath and wheezing.   Cardiovascular: Negative for chest pain, palpitations and leg swelling.  Gastrointestinal: Negative for nausea, vomiting, abdominal pain, diarrhea, constipation and abdominal distention.  Endocrine: Negative for polydipsia, polyphagia and polyuria.  Genitourinary: Negative for dysuria, urgency, hematuria and flank pain.  Musculoskeletal: Negative for myalgias, back pain, gait problem and neck pain.  Skin: Negative for pallor, rash and wound.  Neurological: Negative for seizures, syncope, weakness, numbness and headaches.  Psychiatric/Behavioral: Negative.  Negative for confusion and dysphoric mood.    Objective:     BP 113/73 mmHg  Pulse 96  Ht '5\' 9"'$  (1.753 m)  Wt 164 lb (74.39 kg)  BMI 24.21 kg/m2  SpO2 99%  Wt Readings from Last 3 Encounters:  04/24/15 164 lb (74.39 kg)  03/26/15 162 lb (73.483 kg)  12/12/14 164 lb (74.39 kg)    Physical Exam  Constitutional: He is oriented to person, place, and time. He appears well-developed and well-nourished. He is cooperative. No distress.  HENT:  Head: Normocephalic and atraumatic.  Eyes: EOM are normal.  Neck: Normal range of motion. Neck supple. No tracheal deviation present. No thyromegaly present.  Cardiovascular: Normal rate, S1 normal, S2 normal and normal heart sounds.  Exam reveals no gallop.   No murmur heard. Pulses:      Dorsalis pedis pulses are 1+ on the right side, and 1+ on the left side.       Posterior tibial pulses are 1+ on the right side, and 1+ on the left side.  Pulmonary/Chest: Breath sounds normal. No respiratory distress. He has no wheezes.  Abdominal: Soft. Bowel sounds are normal. He exhibits no distension. There is no tenderness. There is no guarding and no CVA tenderness.  Musculoskeletal: He exhibits no edema.  Right shoulder: He exhibits no swelling and no deformity.  Neurological: He is alert and oriented to person, place, and time. He has normal strength and normal reflexes. No cranial nerve deficit or sensory deficit. Gait normal.  Skin: Skin is warm and dry. No rash noted. No cyanosis. Nails show no clubbing.  Psychiatric: He has a normal mood and affect. His speech is normal and behavior is normal. Judgment and thought content normal. Cognition and memory are normal.    Results for orders placed or performed in visit on 65/68/12  Basic metabolic panel  Result Value Ref Range   Sodium 138 135 - 146 mmol/L   Potassium 5.0 3.5 - 5.3 mmol/L   Chloride 103 98 - 110 mmol/L   CO2 27 20 - 31 mmol/L   Glucose, Bld 259 (H) 65 - 99 mg/dL   BUN 18 7 - 25 mg/dL   Creat 0.90 0.70 - 1.33 mg/dL   Calcium 9.5 8.6 - 10.3  mg/dL  Hemoglobin A1c  Result Value Ref Range   Hgb A1c MFr Bld 9.5 (H) <5.7 %   Mean Plasma Glucose 226 (H) <117 mg/dL   Diabetic Labs (most recent): Lab Results  Component Value Date   HGBA1C 9.5* 03/15/2015   HGBA1C 9.1* 12/05/2014   HGBA1C * 07/02/2008    8.1 (NOTE) The ADA recommends the following therapeutic goal for glycemic control related to Hgb A1c measurement: Goal of therapy: <6.5 Hgb A1c  Reference: American Diabetes Association: Clinical Practice Recommendations 2010, Diabetes Care, 2010, 33: (Suppl  1).   Lipid Panel     Component Value Date/Time   CHOL 226* 07/17/2014 0939   TRIG 317* 07/17/2014 0939   HDL 30* 07/17/2014 0939   CHOLHDL 7.5 07/17/2014 0939   VLDL 63* 07/17/2014 0939   LDLCALC 133* 07/17/2014 0939      Assessment & Plan:   1. Type 2 diabetes mellitus with vascular disease (HCC) -His diabetes is  complicated by coronary artery disease and patient remains at a high risk for more acute and chronic complications of diabetes which include CAD, CVA, CKD, retinopathy, and neuropathy. These are all discussed in detail with the patient. -he complied better. --His Recent a1c has been higher at 9.5%, he is regaining his lost weight.  - I have re-counseled the patient on diet management  by adopting a carbohydrate restricted / protein rich  Diet.  - Suggestion is made for patient to avoid simple carbohydrates   from their diet including Cakes , Desserts, Ice Cream,  Soda (  diet and regular) , Sweet Tea , Candies,  Chips, Cookies, Artificial Sweeteners,   and "Sugar-free" Products .  This will help patient to have stable blood glucose profile and potentially avoid unintended  Weight gain.  - Patient is advised to stick to a routine mealtimes to eat 3 meals  a day and avoid unnecessary snacks ( to snack only to correct hypoglycemia).  - The patient  will be  scheduled with Jearld Fenton, RDN, CDE for individualized DM education.  - I have approached  patient with the following individualized plan to manage diabetes and patient agrees.  - He will  Not need prandial insulin for now. - Asked him to increase Tresiba to 40 units at bedtime  associated with moitoring of BG   at breakfast . -He is encouraged to call clinic for blood glucose levels less than 70 or above 200 mg /dl. - I will discontinue his   Invokana due to weight  loss, unintentional.  -Patient  Is not a candidate for  incretin therapy. -Target numbers for A1c, LDL, HDL, Triglycerides, Waist Circumference were discussed in detail.   2) BP/HTN: Controlled. Continue current medications including beta blockers. 3) Lipids/HPL:  continue statins. 4)  Weight/Diet:   he missed his CDE consult, exercise, and carbohydrates information provided.  5) Chronic Care/Health Maintenance:  -Patient is  Statin medications and encouraged to continue to follow up with Ophthalmology, Podiatrist at least yearly or according to recommendations, and advised to quit smoking. I have recommended yearly flu vaccine and pneumonia vaccination at least every 5 years; moderate intensity exercise for up to 150 minutes weekly; and  sleep for at least 7 hours a day.  I advised patient to maintain close follow up with their PCP for primary care needs.  Patient is asked to bring meter and  blood glucose logs during their next visit.   Follow up plan: Return in about 10 weeks (around 07/03/2015) for diabetes, high blood pressure, high cholesterol, follow up with pre-visit labs, meter, and logs.  Glade Lloyd, MD Phone: [phone]  Fax: [phone]   04/24/2015, 8:41 AM

## 2015-07-06 ENCOUNTER — Encounter: Payer: Self-pay | Admitting: "Endocrinology

## 2015-07-06 ENCOUNTER — Ambulatory Visit (INDEPENDENT_AMBULATORY_CARE_PROVIDER_SITE_OTHER): Payer: BLUE CROSS/BLUE SHIELD | Admitting: "Endocrinology

## 2015-07-06 ENCOUNTER — Other Ambulatory Visit: Payer: Self-pay | Admitting: "Endocrinology

## 2015-07-06 VITALS — BP 118/69 | HR 85 | Ht 69.0 in | Wt 165.0 lb

## 2015-07-06 DIAGNOSIS — E785 Hyperlipidemia, unspecified: Secondary | ICD-10-CM

## 2015-07-06 DIAGNOSIS — I10 Essential (primary) hypertension: Secondary | ICD-10-CM | POA: Diagnosis not present

## 2015-07-06 DIAGNOSIS — E1159 Type 2 diabetes mellitus with other circulatory complications: Secondary | ICD-10-CM | POA: Diagnosis not present

## 2015-07-06 LAB — BASIC METABOLIC PANEL
BUN: 12 mg/dL (ref 7–25)
CHLORIDE: 105 mmol/L (ref 98–110)
CO2: 26 mmol/L (ref 20–31)
Calcium: 9.3 mg/dL (ref 8.6–10.3)
Creat: 0.78 mg/dL (ref 0.70–1.33)
GLUCOSE: 206 mg/dL — AB (ref 65–99)
POTASSIUM: 4.6 mmol/L (ref 3.5–5.3)
SODIUM: 138 mmol/L (ref 135–146)

## 2015-07-06 LAB — HEMOGLOBIN A1C
Hgb A1c MFr Bld: 9.3 % — ABNORMAL HIGH (ref <5.7)
Mean Plasma Glucose: 220 mg/dL

## 2015-07-06 NOTE — Progress Notes (Signed)
Subjective:    Patient ID: Nicholas [name], male    DOB: 1960-08-16,    Past Medical History  Diagnosis Date  . Type 2 diabetes mellitus ([place])   . Mixed hyperlipidemia   . Myocardial infarction ([place])   . GERD (gastroesophageal reflux disease)   . Helicobacter pylori (H. pylori) 09/2009    Treated with helidac  . Colon polyp     Hyperplastic rectosigmoid polyp 9/08  . Hiatal hernia   . Coronary atherosclerosis of native coronary artery     DES to PLB, BMS to mid and distal RCA, DES to circumflex and distal LAD, 6/10  . Hyperplastic colon polyp 2008  . Hiatal hernia   . Anal lesion     Anal papilla/tags - external lesion   Past Surgical History  Procedure Laterality Date  . Collapsed lung      15 years ago  . 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 Cholecystectomy    . Esophagogastroduodenoscopy  10/12/2006    Dr. Gala Romney- examination of the tubular esophagus revealed no mucosal abnormalities. the EG junction was easily traversed. small hiatal hernia, the gastric mucosa o/w appeared normal. there was no infiltrating process or frank ulcer seen.  . Colonoscopy  10/12/2006    Dr. Gala Romney- rectosigmoid polyp s/phot snare polypectomy o/w normal rectum and terminal ileum. hyperplastic polyp on bx   Social History   Social History  . Marital Status: Married    Spouse Name: N/A  . Number of Children: N/A  . Years of Education: N/A   Occupational History  . Self-employed    Social History Main Topics  . Smoking status: Light Tobacco Smoker -- 1.00 packs/day for 34 years    Types: Cigarettes  . Smokeless tobacco: Never Used  . Alcohol Use: No  . Drug Use: No  . Sexual Activity:    Partners: Female    Birth Control/ Protection: Pill   Other Topics Concern  . None   Social History Narrative   Outpatient Encounter Prescriptions as of 07/06/2015  Medication Sig  . aspirin 325 MG EC tablet Take 325 mg by mouth daily.    . clopidogrel (PLAVIX) 75 MG tablet TAKE  ONE TABLET BY MOUTH ONCE DAILY.  Marland Kitchen Insulin Degludec 100 UNIT/ML SOPN Inject 46 Units into the skin at bedtime.  Marland Kitchen NITROSTAT 0.4 MG SL tablet DISSOLVE 1 TABLET UNDER TONGUE EVERY 5 MINUTES UP TO 15 MIN FOR CHESTPAIN. IF NO RELIEF CALL 911.  . pantoprazole (PROTONIX) 40 MG tablet Take 1 tablet by mouth daily.   . traMADol (ULTRAM) 50 MG tablet Take 50 mg by mouth every 6 (six) hours as needed.  Marland Kitchen atorvastatin (LIPITOR) 40 MG tablet Take 1 tablet (40 mg total) by mouth daily. (Patient not taking: Reported on 07/06/2015)  . glucose blood test strip Use as instructed 2 times daily. E11.65 Breeze 2 test strips  . metoprolol tartrate (LOPRESSOR) 25 MG tablet TAKE (1) TABLET BY MOUTH ONCE DAILY. (Patient not taking: Reported on 07/06/2015)   No facility-administered encounter medications on file as of 07/06/2015.   ALLERGIES: No Known Allergies VACCINATION STATUS:  There is no immunization history on file for this patient.  Diabetes He presents for his follow-up diabetic visit. He has type 2 diabetes mellitus. Onset time: He was diagnosed at approximate age of 57 years. In this particular patient with a history of heavy alcohol use possiblity of pancreatic diabetes is high. His disease  course has been improving. There are no hypoglycemic associated symptoms. Pertinent negatives for hypoglycemia include no confusion, headaches, pallor or seizures. Pertinent negatives for diabetes include no chest pain, no fatigue, no polydipsia, no polyphagia, no polyuria and no weakness. There are no hypoglycemic complications. Symptoms are improving. Diabetic complications include heart disease. Risk factors for coronary artery disease include diabetes mellitus, dyslipidemia, hypertension, male sex, sedentary lifestyle and tobacco exposure. Current diabetic treatment includes oral agent (dual therapy). He is compliant with treatment none of the time. His weight is increasing steadily. He is following a generally unhealthy diet.  When asked about meal planning, he reported none. He has not had a previous visit with a dietitian (he did not keep appointment.). He rarely participates in exercise. Home blood sugar record trend: He did not monitor blood glucose, he did not take the insulin I prescribed, he resumed glimepiride which I discontinued. His breakfast blood glucose range is generally 140-180 mg/dl. His dinner blood glucose range is generally 180-200 mg/dl. Eye exam is current.  Hyperlipidemia This is a chronic problem. The current episode started more than 1 year ago. Pertinent negatives include no chest pain, myalgias or shortness of breath. Current antihyperlipidemic treatment includes statins.  Hypertension This is a chronic problem. The current episode started more than 1 year ago. Pertinent negatives include no chest pain, headaches, neck pain, palpitations or shortness of breath. Past treatments include beta blockers. Hypertensive end-organ damage includes CAD/MI.     Review of Systems  Constitutional: Negative for fatigue and unexpected weight change.  HENT: Negative for dental problem, mouth sores and trouble swallowing.   Eyes: Negative for visual disturbance.  Respiratory: Negative for cough, choking, chest tightness, shortness of breath and wheezing.   Cardiovascular: Negative for chest pain, palpitations and leg swelling.  Gastrointestinal: Negative for nausea, vomiting, abdominal pain, diarrhea, constipation and abdominal distention.  Endocrine: Negative for polydipsia, polyphagia and polyuria.  Genitourinary: Negative for dysuria, urgency, hematuria and flank pain.  Musculoskeletal: Negative for myalgias, back pain, gait problem and neck pain.  Skin: Negative for pallor, rash and wound.  Neurological: Negative for seizures, syncope, weakness, numbness and headaches.  Psychiatric/Behavioral: Negative.  Negative for confusion and dysphoric mood.    Objective:    BP 118/69 mmHg  Pulse 85  Ht '5\' 9"'$   (1.753 m)  Wt 165 lb (74.844 kg)  BMI 24.36 kg/m2  SpO2 98%  Wt Readings from Last 3 Encounters:  07/06/15 165 lb (74.844 kg)  04/24/15 164 lb (74.39 kg)  03/26/15 162 lb (73.483 kg)    Physical Exam  Constitutional: He is oriented to person, place, and time. He appears well-developed and well-nourished. He is cooperative. No distress.  HENT:  Head: Normocephalic and atraumatic.  Eyes: EOM are normal.  Neck: Normal range of motion. Neck supple. No tracheal deviation present. No thyromegaly present.  Cardiovascular: Normal rate, S1 normal, S2 normal and normal heart sounds.  Exam reveals no gallop.   No murmur heard. Pulses:      Dorsalis pedis pulses are 1+ on the right side, and 1+ on the left side.       Posterior tibial pulses are 1+ on the right side, and 1+ on the left side.  Pulmonary/Chest: Breath sounds normal. No respiratory distress. He has no wheezes.  Abdominal: Soft. Bowel sounds are normal. He exhibits no distension. There is no tenderness. There is no guarding and no CVA tenderness.  Musculoskeletal: He exhibits no edema.       Right shoulder:  He exhibits no swelling and no deformity.  Neurological: He is alert and oriented to person, place, and time. He has normal strength and normal reflexes. No cranial nerve deficit or sensory deficit. Gait normal.  Skin: Skin is warm and dry. No rash noted. No cyanosis. Nails show no clubbing.  Psychiatric: He has a normal mood and affect. His speech is normal and behavior is normal. Judgment and thought content normal. Cognition and memory are normal.    Results for orders placed or performed in visit on 67/20/94  Basic metabolic panel  Result Value Ref Range   Sodium 138 135 - 146 mmol/L   Potassium 5.0 3.5 - 5.3 mmol/L   Chloride 103 98 - 110 mmol/L   CO2 27 20 - 31 mmol/L   Glucose, Bld 259 (H) 65 - 99 mg/dL   BUN 18 7 - 25 mg/dL   Creat 0.90 0.70 - 1.33 mg/dL   Calcium 9.5 8.6 - 10.3 mg/dL  Hemoglobin A1c  Result Value  Ref Range   Hgb A1c MFr Bld 9.5 (H) <5.7 %   Mean Plasma Glucose 226 (H) <117 mg/dL   Diabetic Labs (most recent): Lab Results  Component Value Date   HGBA1C 9.5* 03/15/2015   HGBA1C 9.1* 12/05/2014   HGBA1C * 07/02/2008    8.1 (NOTE) The ADA recommends the following therapeutic goal for glycemic control related to Hgb A1c measurement: Goal of therapy: <6.5 Hgb A1c  Reference: American Diabetes Association: Clinical Practice Recommendations 2010, Diabetes Care, 2010, 33: (Suppl  1).   Lipid Panel     Component Value Date/Time   CHOL 226* 07/17/2014 0939   TRIG 317* 07/17/2014 0939   HDL 30* 07/17/2014 0939   CHOLHDL 7.5 07/17/2014 0939   VLDL 63* 07/17/2014 0939   LDLCALC 133* 07/17/2014 0939      Assessment & Plan:   1. Type 2 diabetes mellitus with vascular disease (HCC) -His diabetes is  complicated by coronary artery disease and patient remains at a high risk for more acute and chronic complications of diabetes which include CAD, CVA, CKD, retinopathy, and neuropathy. These are all discussed in detail with the patient. -he complied better with his basal insulin.  -However, he did not go for the labs ordered. --His  last a1c has been higher at 9.5%, he  Now has a steady weight.   - I have re-counseled the patient on diet management  by adopting a carbohydrate restricted / protein rich  Diet.  - Suggestion is made for patient to avoid simple carbohydrates   from their diet including Cakes , Desserts, Ice Cream,  Soda (  diet and regular) , Sweet Tea , Candies,  Chips, Cookies, Artificial Sweeteners,   and "Sugar-free" Products .  This will help patient to have stable blood glucose profile and potentially avoid unintended  Weight gain.  - Patient is advised to stick to a routine mealtimes to eat 3 meals  a day and avoid unnecessary snacks ( to snack only to correct hypoglycemia).  - The patient  will be  scheduled with Jearld Fenton, RDN, CDE for individualized DM  education.  - I have approached patient with the following individualized plan to manage diabetes and patient agrees.  - He will be sent to lab today. - His average blood glucose is 203,  I asked him to increase Tresiba to 46 units at bedtime  associated with moitoring of BG   before meals and at bedtime for one week and present his  meter and logs for reevaluation. -If his glycemic profile is significantly above target, he will be considered for bolus insulin for at least a few weeks. -He is encouraged to call clinic for blood glucose levels less than 70 or above 200 mg /dl. - I will discontinue his   Invokana due to weight loss, unintentional.  -Patient  Is not a candidate for  incretin therapy. -Target numbers for A1c, LDL, HDL, Triglycerides, Waist Circumference were discussed in detail.   2) BP/HTN: Controlled. Continue current medications including beta blockers. 3) Lipids/HPL:  continue statins. 4)  Weight/Diet:   he missed his CDE consult, exercise, and carbohydrates information provided.  5) Chronic Care/Health Maintenance:  -Patient is  Statin medications and encouraged to continue to follow up with Ophthalmology, Podiatrist at least yearly or according to recommendations, and advised to quit smoking. I have recommended yearly flu vaccine and pneumonia vaccination at least every 5 years; moderate intensity exercise for up to 150 minutes weekly; and  sleep for at least 7 hours a day.  I advised patient to maintain close follow up with their PCP for primary care needs.  Patient is asked to bring meter and  blood glucose logs during their next visit.   Follow up plan: Return in about 1 week (around 07/13/2015) for diabetes, high blood pressure, high cholesterol, labs today, follow up with pre-visit labs, meter, and logs.  Glade Lloyd, MD Phone: [phone]  Fax: [phone]   07/06/2015, 10:46 AM

## 2015-07-10 ENCOUNTER — Other Ambulatory Visit: Payer: Self-pay | Admitting: Cardiology

## 2015-07-12 ENCOUNTER — Ambulatory Visit: Payer: BLUE CROSS/BLUE SHIELD | Admitting: "Endocrinology

## 2015-07-20 ENCOUNTER — Ambulatory Visit (INDEPENDENT_AMBULATORY_CARE_PROVIDER_SITE_OTHER): Payer: BLUE CROSS/BLUE SHIELD | Admitting: "Endocrinology

## 2015-07-20 ENCOUNTER — Encounter: Payer: Self-pay | Admitting: "Endocrinology

## 2015-07-20 VITALS — BP 109/71 | HR 81 | Ht 69.0 in | Wt 164.0 lb

## 2015-07-20 DIAGNOSIS — E1159 Type 2 diabetes mellitus with other circulatory complications: Secondary | ICD-10-CM | POA: Diagnosis not present

## 2015-07-20 DIAGNOSIS — I10 Essential (primary) hypertension: Secondary | ICD-10-CM

## 2015-07-20 DIAGNOSIS — E785 Hyperlipidemia, unspecified: Secondary | ICD-10-CM | POA: Diagnosis not present

## 2015-07-20 MED ORDER — INSULIN LISPRO 100 UNIT/ML (KWIKPEN): 8.0000 [IU] | PEN_INJECTOR | Freq: Three times a day (TID) | SUBCUTANEOUS | Status: DC

## 2015-07-20 NOTE — Patient Instructions (Signed)
Advice for weight management -For most of us the best way to lose weight is by diet management. Generally speaking, diet management means restricting carbohydrate consumption to minimum possible (and to unprocessed or minimally processed complex starch) and increasing protein intake (animal or plant source), fruits, and vegetables.  -Sticking to a routine mealtime to eat 3 meals a day and avoiding unnecessary snacks is shown to have a big role in weight control.  -It is better to avoid simple carbohydrates including: Cakes, Desserts, Ice Cream, Soda (diet and regular), Sweet Tea, Candies, Chips, Cookies, Artificial Sweeteners, and "Sugar-free" Products.   -Exercise: 30 minutes a day 3-4 days a week, or 150 minutes a week. Combine stretch, strength, and aerobic activities. You may seek evaluation by your heart doctor prior to initiating exercise if you have high risk for heart disease.  -If you are interested, we can schedule a visit with Nicholas [name], RDN, CDE for individualized nutrition education.  

## 2015-07-20 NOTE — Progress Notes (Signed)
Subjective:    Patient ID: Nicholas [name], male    DOB: May 16, 1960,    Past Medical History  Diagnosis Date  . Type 2 diabetes mellitus ([place])   . Mixed hyperlipidemia   . Myocardial infarction ([place])   . GERD (gastroesophageal reflux disease)   . Helicobacter pylori (H. pylori) 09/2009    Treated with helidac  . Colon polyp     Hyperplastic rectosigmoid polyp 9/08  . Hiatal hernia   . Coronary atherosclerosis of native coronary artery     DES to PLB, BMS to mid and distal RCA, DES to circumflex and distal LAD, 6/10  . Hyperplastic colon polyp 2008  . Hiatal hernia   . Anal lesion     Anal papilla/tags - external lesion   Past Surgical History  Procedure Laterality Date  . Collapsed lung      15 years ago  . 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 Cholecystectomy    . Esophagogastroduodenoscopy  10/12/2006    Dr. Gala Romney- examination of the tubular esophagus revealed no mucosal abnormalities. the EG junction was easily traversed. small hiatal hernia, the gastric mucosa o/w appeared normal. there was no infiltrating process or frank ulcer seen.  . Colonoscopy  10/12/2006    Dr. Gala Romney- rectosigmoid polyp s/phot snare polypectomy o/w normal rectum and terminal ileum. hyperplastic polyp on bx   Social History   Social History  . Marital Status: Married    Spouse Name: N/A  . Number of Children: N/A  . Years of Education: N/A   Occupational History  . Self-employed    Social History Main Topics  . Smoking status: Light Tobacco Smoker -- 1.00 packs/day for 34 years    Types: Cigarettes  . Smokeless tobacco: Never Used  . Alcohol Use: No  . Drug Use: No  . Sexual Activity:    Partners: Female    Birth Control/ Protection: Pill   Other Topics Concern  . None   Social History Narrative   Outpatient Encounter Prescriptions as of 07/20/2015  Medication Sig  . aspirin 325 MG EC tablet Take 325 mg by mouth daily.    . clopidogrel (PLAVIX) 75 MG tablet TAKE  ONE TABLET BY MOUTH ONCE DAILY.  Marland Kitchen glucose blood test strip Use as instructed 2 times daily. E11.65 Breeze 2 test strips  . Insulin Degludec 100 UNIT/ML SOPN Inject 50 Units into the skin at bedtime.  . insulin lispro (HUMALOG KWIKPEN) 100 UNIT/ML KiwkPen Inject 0.08-0.14 mLs (8-14 Units total) into the skin 3 (three) times daily.  Marland Kitchen NITROSTAT 0.4 MG SL tablet DISSOLVE 1 TABLET UNDER TONGUE EVERY 5 MINUTES UP TO 15 MIN FOR CHESTPAIN. IF NO RELIEF CALL 911.  . pantoprazole (PROTONIX) 40 MG tablet Take 1 tablet by mouth daily.   . traMADol (ULTRAM) 50 MG tablet Take 50 mg by mouth every 6 (six) hours as needed.  . [DISCONTINUED] atorvastatin (LIPITOR) 40 MG tablet Take 1 tablet (40 mg total) by mouth daily. (Patient not taking: Reported on 07/06/2015)  . [DISCONTINUED] metoprolol tartrate (LOPRESSOR) 25 MG tablet TAKE (1) TABLET BY MOUTH ONCE DAILY. (Patient not taking: Reported on 07/06/2015)   No facility-administered encounter medications on file as of 07/20/2015.   ALLERGIES: No Known Allergies VACCINATION STATUS:  There is no immunization history on file for this patient.  Diabetes He presents for his follow-up diabetic visit. He has type 2 diabetes mellitus. Onset time: He was diagnosed at  approximate age of 89 years. In this particular patient with a history of heavy alcohol use possiblity of pancreatic diabetes is high. His disease course has been worsening. There are no hypoglycemic associated symptoms. Pertinent negatives for hypoglycemia include no confusion, headaches, pallor or seizures. Pertinent negatives for diabetes include no chest pain, no fatigue, no polydipsia, no polyphagia, no polyuria and no weakness. There are no hypoglycemic complications. Symptoms are worsening. Diabetic complications include heart disease. Risk factors for coronary artery disease include diabetes mellitus, dyslipidemia, hypertension, male sex, sedentary lifestyle and tobacco exposure. Current diabetic  treatment includes oral agent (dual therapy). He is compliant with treatment none of the time. His weight is increasing steadily. He is following a generally unhealthy diet. When asked about meal planning, he reported none. He has not had a previous visit with a dietitian (he did not keep appointment.). He rarely participates in exercise. His breakfast blood glucose range is generally 140-180 mg/dl. His lunch blood glucose range is generally 180-200 mg/dl. His dinner blood glucose range is generally 180-200 mg/dl. His overall blood glucose range is 180-200 mg/dl. Eye exam is current.  Hyperlipidemia This is a chronic problem. The current episode started more than 1 year ago. Pertinent negatives include no chest pain, myalgias or shortness of breath. Current antihyperlipidemic treatment includes statins.  Hypertension This is a chronic problem. The current episode started more than 1 year ago. Pertinent negatives include no chest pain, headaches, neck pain, palpitations or shortness of breath. Past treatments include beta blockers. Hypertensive end-organ damage includes CAD/MI.     Review of Systems  Constitutional: Negative for fatigue and unexpected weight change.  HENT: Negative for dental problem, mouth sores and trouble swallowing.   Eyes: Negative for visual disturbance.  Respiratory: Negative for cough, choking, chest tightness, shortness of breath and wheezing.   Cardiovascular: Negative for chest pain, palpitations and leg swelling.  Gastrointestinal: Negative for nausea, vomiting, abdominal pain, diarrhea, constipation and abdominal distention.  Endocrine: Negative for polydipsia, polyphagia and polyuria.  Genitourinary: Negative for dysuria, urgency, hematuria and flank pain.  Musculoskeletal: Negative for myalgias, back pain, gait problem and neck pain.  Skin: Negative for pallor, rash and wound.  Neurological: Negative for seizures, syncope, weakness, numbness and headaches.   Psychiatric/Behavioral: Negative.  Negative for confusion and dysphoric mood.    Objective:    BP 109/71 mmHg  Pulse 81  Ht '5\' 9"'$  (1.753 m)  Wt 164 lb (74.39 kg)  BMI 24.21 kg/m2  Wt Readings from Last 3 Encounters:  07/20/15 164 lb (74.39 kg)  07/06/15 165 lb (74.844 kg)  04/24/15 164 lb (74.39 kg)    Physical Exam  Constitutional: He is oriented to person, place, and time. He appears well-developed and well-nourished. He is cooperative. No distress.  HENT:  Head: Normocephalic and atraumatic.  Eyes: EOM are normal.  Neck: Normal range of motion. Neck supple. No tracheal deviation present. No thyromegaly present.  Cardiovascular: Normal rate, S1 normal, S2 normal and normal heart sounds.  Exam reveals no gallop.   No murmur heard. Pulses:      Dorsalis pedis pulses are 1+ on the right side, and 1+ on the left side.       Posterior tibial pulses are 1+ on the right side, and 1+ on the left side.  Pulmonary/Chest: Breath sounds normal. No respiratory distress. He has no wheezes.  Abdominal: Soft. Bowel sounds are normal. He exhibits no distension. There is no tenderness. There is no guarding and no CVA tenderness.  Musculoskeletal: He exhibits no edema.       Right shoulder: He exhibits no swelling and no deformity.  Neurological: He is alert and oriented to person, place, and time. He has normal strength and normal reflexes. No cranial nerve deficit or sensory deficit. Gait normal.  Skin: Skin is warm and dry. No rash noted. No cyanosis. Nails show no clubbing.  Psychiatric: He has a normal mood and affect. His speech is normal and behavior is normal. Judgment and thought content normal. Cognition and memory are normal.    Results for orders placed or performed in visit on 02/04/30  Basic metabolic panel  Result Value Ref Range   Sodium 138 135 - 146 mmol/L   Potassium 4.6 3.5 - 5.3 mmol/L   Chloride 105 98 - 110 mmol/L   CO2 26 20 - 31 mmol/L   Glucose, Bld 206 (H) 65 -  99 mg/dL   BUN 12 7 - 25 mg/dL   Creat 0.78 0.70 - 1.33 mg/dL   Calcium 9.3 8.6 - 10.3 mg/dL  Hemoglobin A1c  Result Value Ref Range   Hgb A1c MFr Bld 9.3 (H) <5.7 %   Mean Plasma Glucose 220 mg/dL   Diabetic Labs (most recent): Lab Results  Component Value Date   HGBA1C 9.3* 07/06/2015   HGBA1C 9.5* 03/15/2015   HGBA1C 9.1* 12/05/2014   Lipid Panel     Component Value Date/Time   CHOL 226* 07/17/2014 0939   TRIG 317* 07/17/2014 0939   HDL 30* 07/17/2014 0939   CHOLHDL 7.5 07/17/2014 0939   VLDL 63* 07/17/2014 0939   LDLCALC 133* 07/17/2014 0939      Assessment & Plan:   1. Type 2 diabetes mellitus with vascular disease (HCC) -His diabetes is  complicated by coronary artery disease and patient remains at a high risk for more acute and chronic complications of diabetes which include CAD, CVA, CKD, retinopathy, and neuropathy. These are all discussed in detail with the patient.  --His  last a1c Remains high at 9.3% largely unchanged from last visit when it was 9.5%.  - I have re-counseled the patient on diet management  by adopting a carbohydrate restricted / protein rich  Diet.  - Suggestion is made for patient to avoid simple carbohydrates   from their diet including Cakes , Desserts, Ice Cream,  Soda (  diet and regular) , Sweet Tea , Candies,  Chips, Cookies, Artificial Sweeteners,   and "Sugar-free" Products .  This will help patient to have stable blood glucose profile and potentially avoid unintended  Weight gain.  - Patient is advised to stick to a routine mealtimes to eat 3 meals  a day and avoid unnecessary snacks ( to snack only to correct hypoglycemia).  - The patient  will be  scheduled with Jearld Fenton, RDN, CDE for individualized DM education.  - I have approached patient with the following individualized plan to manage diabetes and patient agrees.   - His average blood glucose is  still above 200, he will need bolus insulin.  - I asked him to increase  Tresiba to 50 units at bedtime ,  Initiate NovoLog 8 units 3 times a day before meals for pre-meal blood glucose above 90 mg/dL, associated with strict monitoring of blood glucose before meals and at bedtime.   -He is encouraged to call clinic for blood glucose levels less than 70 or above 200 mg /dl. - I will discontinue his   Invokana due to weight loss, unintentional.  -  Patient  is not a candidate for  incretin therapy. -Target numbers for A1c, LDL, HDL, Triglycerides, Waist Circumference were discussed in detail.   2) BP/HTN: Controlled. Continue current medications including beta blockers. 3) Lipids/HPL:  continue statins. 4)  Weight/Diet:   he missed his CDE consult, exercise, and carbohydrates information provided.  5) Chronic Care/Health Maintenance:  -Patient is  Statin medications and encouraged to continue to follow up with Ophthalmology, Podiatrist at least yearly or according to recommendations, and advised to quit smoking. I have recommended yearly flu vaccine and pneumonia vaccination at least every 5 years; moderate intensity exercise for up to 150 minutes weekly; and  sleep for at least 7 hours a day.  I advised patient to maintain close follow up with their PCP for primary care needs.  Patient is asked to bring meter and  blood glucose logs during their next visit.   Follow up plan: Return in about 2 weeks (around 08/03/2015) for follow up with meter and logs- no labs.  Glade Lloyd, MD Phone: [phone]  Fax: [phone]   07/20/2015, 10:49 AM

## 2015-07-23 ENCOUNTER — Other Ambulatory Visit: Payer: Self-pay

## 2015-07-23 ENCOUNTER — Telehealth: Payer: Self-pay | Admitting: Cardiology

## 2015-07-23 MED ORDER — GLUCOSE BLOOD VI STRP: ORAL_STRIP | Status: DC

## 2015-07-23 NOTE — Telephone Encounter (Signed)
Pt notified that SM will order lab day of visit

## 2015-07-23 NOTE — Telephone Encounter (Signed)
Pt would like to know if he's needing lab work done prior to his apt on Thurs 7/13

## 2015-08-02 ENCOUNTER — Ambulatory Visit (INDEPENDENT_AMBULATORY_CARE_PROVIDER_SITE_OTHER): Payer: BLUE CROSS/BLUE SHIELD | Admitting: "Endocrinology

## 2015-08-02 ENCOUNTER — Encounter: Payer: Self-pay | Admitting: "Endocrinology

## 2015-08-02 VITALS — BP 112/71 | HR 88 | Ht 69.0 in | Wt 163.0 lb

## 2015-08-02 DIAGNOSIS — E785 Hyperlipidemia, unspecified: Secondary | ICD-10-CM

## 2015-08-02 DIAGNOSIS — E1159 Type 2 diabetes mellitus with other circulatory complications: Secondary | ICD-10-CM | POA: Diagnosis not present

## 2015-08-02 DIAGNOSIS — I10 Essential (primary) hypertension: Secondary | ICD-10-CM | POA: Diagnosis not present

## 2015-08-02 NOTE — Progress Notes (Signed)
Subjective:    Patient ID: Nicholas [name], male    DOB: 29-Dec-1960,    Past Medical History  Diagnosis Date  . Type 2 diabetes mellitus ([place])   . Mixed hyperlipidemia   . Myocardial infarction ([place])   . GERD (gastroesophageal reflux disease)   . Helicobacter pylori (H. pylori) 09/2009    Treated with helidac  . Colon polyp     Hyperplastic rectosigmoid polyp 9/08  . Hiatal hernia   . Coronary atherosclerosis of native coronary artery     DES to PLB, BMS to mid and distal RCA, DES to circumflex and distal LAD, 6/10  . Hyperplastic colon polyp 2008  . Hiatal hernia   . Anal lesion     Anal papilla/tags - external lesion   Past Surgical History  Procedure Laterality Date  . Collapsed lung      15 years ago  . 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 Cholecystectomy    . Esophagogastroduodenoscopy  10/12/2006    Dr. Gala Romney- examination of the tubular esophagus revealed no mucosal abnormalities. the EG junction was easily traversed. small hiatal hernia, the gastric mucosa o/w appeared normal. there was no infiltrating process or frank ulcer seen.  . Colonoscopy  10/12/2006    Dr. Gala Romney- rectosigmoid polyp s/phot snare polypectomy o/w normal rectum and terminal ileum. hyperplastic polyp on bx   Social History   Social History  . Marital Status: Married    Spouse Name: N/A  . Number of Children: N/A  . Years of Education: N/A   Occupational History  . Self-employed    Social History Main Topics  . Smoking status: Light Tobacco Smoker -- 1.00 packs/day for 34 years    Types: Cigarettes  . Smokeless tobacco: Never Used  . Alcohol Use: No  . Drug Use: No  . Sexual Activity:    Partners: Female    Birth Control/ Protection: Pill   Other Topics Concern  . None   Social History Narrative   Outpatient Encounter Prescriptions as of 08/02/2015  Medication Sig  . Insulin Lispro (HUMALOG KWIKPEN [place]) Inject 10-16 Units into the skin 3 (three) times daily with  meals.  Marland Kitchen aspirin 325 MG EC tablet Take 325 mg by mouth daily.    . clopidogrel (PLAVIX) 75 MG tablet TAKE ONE TABLET BY MOUTH ONCE DAILY.  Marland Kitchen glucose blood (BAYER CONTOUR TEST) test strip Use as instructed 4 x daily E11.65. Contour plus test strips  . Insulin Degludec 100 UNIT/ML SOPN Inject 50 Units into the skin at bedtime.  Marland Kitchen NITROSTAT 0.4 MG SL tablet DISSOLVE 1 TABLET UNDER TONGUE EVERY 5 MINUTES UP TO 15 MIN FOR CHESTPAIN. IF NO RELIEF CALL 911.  . pantoprazole (PROTONIX) 40 MG tablet Take 1 tablet by mouth daily.   . traMADol (ULTRAM) 50 MG tablet Take 50 mg by mouth every 6 (six) hours as needed.  . [DISCONTINUED] insulin lispro (HUMALOG KWIKPEN) 100 UNIT/ML KiwkPen Inject 0.08-0.14 mLs (8-14 Units total) into the skin 3 (three) times daily.   No facility-administered encounter medications on file as of 08/02/2015.   ALLERGIES: No Known Allergies VACCINATION STATUS:  There is no immunization history on file for this patient.  Diabetes He presents for his follow-up diabetic visit. He has type 2 diabetes mellitus. Onset time: He was diagnosed at approximate age of 55 years. In this particular patient with a history of heavy alcohol use possiblity of pancreatic diabetes is high.  His disease course has been improving. There are no hypoglycemic associated symptoms. Pertinent negatives for hypoglycemia include no confusion, headaches, pallor or seizures. Pertinent negatives for diabetes include no chest pain, no fatigue, no polydipsia, no polyphagia, no polyuria and no weakness. There are no hypoglycemic complications. Symptoms are improving. Diabetic complications include heart disease. Risk factors for coronary artery disease include diabetes mellitus, dyslipidemia, hypertension, male sex, sedentary lifestyle and tobacco exposure. Current diabetic treatment includes oral agent (dual therapy). He is compliant with treatment some of the time. His weight is decreasing steadily. He is following a  generally unhealthy diet. When asked about meal planning, he reported none. He has not had a previous visit with a dietitian (he did not keep appointment.). He rarely participates in exercise. His breakfast blood glucose range is generally 140-180 mg/dl. His lunch blood glucose range is generally 180-200 mg/dl. His dinner blood glucose range is generally 180-200 mg/dl. His overall blood glucose range is 180-200 mg/dl. Eye exam is current.  Hyperlipidemia This is a chronic problem. The current episode started more than 1 year ago. Pertinent negatives include no chest pain, myalgias or shortness of breath. Current antihyperlipidemic treatment includes statins.  Hypertension This is a chronic problem. The current episode started more than 1 year ago. Pertinent negatives include no chest pain, headaches, neck pain, palpitations or shortness of breath. Past treatments include beta blockers. Hypertensive end-organ damage includes CAD/MI.     Review of Systems  Constitutional: Negative for fatigue and unexpected weight change.  HENT: Negative for dental problem, mouth sores and trouble swallowing.   Eyes: Negative for visual disturbance.  Respiratory: Negative for cough, choking, chest tightness, shortness of breath and wheezing.   Cardiovascular: Negative for chest pain, palpitations and leg swelling.  Gastrointestinal: Negative for nausea, vomiting, abdominal pain, diarrhea, constipation and abdominal distention.  Endocrine: Negative for polydipsia, polyphagia and polyuria.  Genitourinary: Negative for dysuria, urgency, hematuria and flank pain.  Musculoskeletal: Negative for myalgias, back pain, gait problem and neck pain.  Skin: Negative for pallor, rash and wound.  Neurological: Negative for seizures, syncope, weakness, numbness and headaches.  Psychiatric/Behavioral: Negative.  Negative for confusion and dysphoric mood.    Objective:    BP 112/71 mmHg  Pulse 88  Ht '5\' 9"'$  (1.753 m)  Wt 163  lb (73.936 kg)  BMI 24.06 kg/m2  Wt Readings from Last 3 Encounters:  08/02/15 163 lb (73.936 kg)  07/20/15 164 lb (74.39 kg)  07/06/15 165 lb (74.844 kg)    Physical Exam  Constitutional: He is oriented to person, place, and time. He appears well-developed and well-nourished. He is cooperative. No distress.  HENT:  Head: Normocephalic and atraumatic.  Eyes: EOM are normal.  Neck: Normal range of motion. Neck supple. No tracheal deviation present. No thyromegaly present.  Cardiovascular: Normal rate, S1 normal, S2 normal and normal heart sounds.  Exam reveals no gallop.   No murmur heard. Pulses:      Dorsalis pedis pulses are 1+ on the right side, and 1+ on the left side.       Posterior tibial pulses are 1+ on the right side, and 1+ on the left side.  Pulmonary/Chest: Breath sounds normal. No respiratory distress. He has no wheezes.  Abdominal: Soft. Bowel sounds are normal. He exhibits no distension. There is no tenderness. There is no guarding and no CVA tenderness.  Musculoskeletal: He exhibits no edema.       Right shoulder: He exhibits no swelling and no deformity.  Neurological:  He is alert and oriented to person, place, and time. He has normal strength and normal reflexes. No cranial nerve deficit or sensory deficit. Gait normal.  Skin: Skin is warm and dry. No rash noted. No cyanosis. Nails show no clubbing.  Psychiatric: He has a normal mood and affect. His speech is normal and behavior is normal. Judgment and thought content normal. Cognition and memory are normal.    Results for orders placed or performed in visit on 16/10/96  Basic metabolic panel  Result Value Ref Range   Sodium 138 135 - 146 mmol/L   Potassium 4.6 3.5 - 5.3 mmol/L   Chloride 105 98 - 110 mmol/L   CO2 26 20 - 31 mmol/L   Glucose, Bld 206 (H) 65 - 99 mg/dL   BUN 12 7 - 25 mg/dL   Creat 0.78 0.70 - 1.33 mg/dL   Calcium 9.3 8.6 - 10.3 mg/dL  Hemoglobin A1c  Result Value Ref Range   Hgb A1c MFr Bld  9.3 (H) <5.7 %   Mean Plasma Glucose 220 mg/dL   Diabetic Labs (most recent): Lab Results  Component Value Date   HGBA1C 9.3* 07/06/2015   HGBA1C 9.5* 03/15/2015   HGBA1C 9.1* 12/05/2014   Lipid Panel     Component Value Date/Time   CHOL 226* 07/17/2014 0939   TRIG 317* 07/17/2014 0939   HDL 30* 07/17/2014 0939   CHOLHDL 7.5 07/17/2014 0939   VLDL 63* 07/17/2014 0939   LDLCALC 133* 07/17/2014 0939      Assessment & Plan:   1. Type 2 diabetes mellitus with vascular disease (HCC) -His diabetes is  complicated by coronary artery disease and patient remains at a high risk for more acute and chronic complications of diabetes which include CAD, CVA, CKD, retinopathy, and neuropathy. These are all discussed in detail with the patient.  --His  last a1c Remains high at 9.3% largely unchanged from last visit when it was 9.5%.  - I have re-counseled the patient on diet management  by adopting a carbohydrate restricted / protein rich  Diet.  - Suggestion is made for patient to avoid simple carbohydrates   from their diet including Cakes , Desserts, Ice Cream,  Soda (  diet and regular) , Sweet Tea , Candies,  Chips, Cookies, Artificial Sweeteners,   and "Sugar-free" Products .  This will help patient to have stable blood glucose profile and potentially avoid unintended  Weight gain.  - Patient is advised to stick to a routine mealtimes to eat 3 meals  a day and avoid unnecessary snacks ( to snack only to correct hypoglycemia).  - The patient  will be  scheduled with Jearld Fenton, RDN, CDE for individualized DM education.  - I have approached patient with the following individualized plan to manage diabetes and patient agrees.   - His average blood glucose is  Better 175-200, he will Continue to require basal/ bolus insulin.  - I asked him to increase Tresiba to 50 units at bedtime ,  Increase NovoLog to 10 units 3 times a day before meals for pre-meal blood glucose above 90 mg/dL,  associated with strict monitoring of blood glucose before meals and at bedtime.   -He is encouraged to call clinic for blood glucose levels less than 70 or above 200 mg /dl. - I will discontinue his   Invokana due to weight loss, unintentional. -He has now more steady body weight although he mentioned that he lost approximately 50 pounds over 5 years.  -  Patient  is not a candidate for  incretin therapy. -Target numbers for A1c, LDL, HDL, Triglycerides, Waist Circumference were discussed in detail.   2) BP/HTN: Controlled. Continue current medications including beta blockers. 3) Lipids/HPL:  continue statins. 4)  Weight/Diet:   he missed his CDE consult, exercise, and carbohydrates information provided.  5) Chronic Care/Health Maintenance:  -Patient is  Statin medications and encouraged to continue to follow up with Ophthalmology, Podiatrist at least yearly or according to recommendations, and advised to quit smoking. I have recommended yearly flu vaccine and pneumonia vaccination at least every 5 years; moderate intensity exercise for up to 150 minutes weekly; and  sleep for at least 7 hours a day.  I advised patient to maintain close follow up with their PCP for primary care needs.  Patient is asked to bring meter and  blood glucose logs during their next visit.   Follow up plan: Return in about 9 weeks (around 10/04/2015) for follow up with pre-visit labs, meter, and logs.  Glade Lloyd, MD Phone: [phone]  Fax: [phone]   08/02/2015, 9:23 AM

## 2015-08-09 ENCOUNTER — Encounter: Payer: Self-pay | Admitting: Cardiology

## 2015-08-09 ENCOUNTER — Ambulatory Visit (INDEPENDENT_AMBULATORY_CARE_PROVIDER_SITE_OTHER): Payer: BLUE CROSS/BLUE SHIELD | Admitting: Cardiology

## 2015-08-09 VITALS — BP 106/62 | HR 93 | Ht 69.0 in | Wt 159.0 lb

## 2015-08-09 DIAGNOSIS — E118 Type 2 diabetes mellitus with unspecified complications: Secondary | ICD-10-CM

## 2015-08-09 DIAGNOSIS — E785 Hyperlipidemia, unspecified: Secondary | ICD-10-CM

## 2015-08-09 DIAGNOSIS — I2510 Atherosclerotic heart disease of native coronary artery without angina pectoris: Secondary | ICD-10-CM

## 2015-08-09 DIAGNOSIS — F17200 Nicotine dependence, unspecified, uncomplicated: Secondary | ICD-10-CM

## 2015-08-09 DIAGNOSIS — Z794 Long term (current) use of insulin: Secondary | ICD-10-CM

## 2015-08-09 NOTE — Progress Notes (Signed)
Cardiology Office Note  Date: 08/09/2015   ID: Nicholas [name], DOB Oct 03, 1960, MRN [id number]  PCP: Purvis Kilts, MD  Primary Cardiologist: Rozann Lesches, MD   Chief Complaint  Patient presents with  . Coronary Artery Disease    History of Present Illness: Nicholas [name] is a 55 y.o. male last seen in May 2016. He presents for a routine follow-up visit. Reports no angina symptoms or nitroglycerin use. 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  He had a follow-up Cardiolite study last June, results outlined below and overall low risk. We discussed this today. I reviewed his ECG today which shows sinus rhythm with incomplete right bundle-branch block.  We went over his medications. He reports no bleeding problems on aspirin and Plavix. Still tolerating Lipitor. He is due for follow-up lipid panel.  He is working on his diabetes control with Dr. Dorris Fetch. Hemoglobin A1c in June was 9.3. He is trying to watch his diet more carefully, has cut out soft drinks. Continues on insulin.  Past Medical History  Diagnosis Date  . Type 2 diabetes mellitus ([place])   . Mixed hyperlipidemia   . Myocardial infarction ([place])   . GERD (gastroesophageal reflux disease)   . Helicobacter pylori (H. pylori) 09/2009    Treated with helidac  . Colon polyp     Hyperplastic rectosigmoid polyp 9/08  . Hiatal hernia   . Coronary atherosclerosis of native coronary artery     DES to PLB, BMS to mid and distal RCA, DES to circumflex and distal LAD, 6/10  . Hyperplastic colon polyp 2008  . Hiatal hernia   . Anal lesion     Anal papilla/tags - external lesion    Past Surgical History  Procedure Laterality Date  . Collapsed lung      15 years ago  . 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 Cholecystectomy    . Esophagogastroduodenoscopy  10/12/2006    Dr. Gala Romney- examination of the tubular esophagus revealed no mucosal abnormalities. the EG junction was easily traversed. small  hiatal hernia, the gastric mucosa o/w appeared normal. there was no infiltrating process or frank ulcer seen.  . Colonoscopy  10/12/2006    Dr. Gala Romney- rectosigmoid polyp s/phot snare polypectomy o/w normal rectum and terminal ileum. hyperplastic polyp on bx    Current Outpatient Prescriptions  Medication Sig Dispense Refill  . aspirin 325 MG EC tablet Take 325 mg by mouth daily.      . clopidogrel (PLAVIX) 75 MG tablet TAKE ONE TABLET BY MOUTH ONCE DAILY. 30 tablet 11  . glucose blood (BAYER CONTOUR TEST) test strip Use as instructed 4 x daily E11.65. Contour plus test strips 150 each 5  . Insulin Degludec 100 UNIT/ML SOPN Inject 50 Units into the skin at bedtime.    . Insulin Lispro (HUMALOG KWIKPEN [place]) Inject 10-16 Units into the skin 3 (three) times daily with meals.    Marland Kitchen NITROSTAT 0.4 MG SL tablet DISSOLVE 1 TABLET UNDER TONGUE EVERY 5 MINUTES UP TO 15 MIN FOR CHESTPAIN. IF NO RELIEF CALL 911. 25 tablet 3  . pantoprazole (PROTONIX) 40 MG tablet Take 1 tablet by mouth daily.     . traMADol (ULTRAM) 50 MG tablet Take 50 mg by mouth every 6 (six) hours as needed.    Marland Kitchen atorvastatin (LIPITOR) 40 MG tablet     . [DISCONTINUED] topiramate (TOPAMAX) 25 MG tablet Take 25 mg by mouth daily.  No current facility-administered medications for this visit.   Allergies:  Review of patient's allergies indicates no known allergies.   Social History: The patient  reports that he has been smoking Cigarettes.  He has a 34 pack-year smoking history. He has never used smokeless tobacco. He reports that he does not drink alcohol or use illicit drugs.   ROS:  Please see the history of present illness. Otherwise, complete review of systems is positive for weight loss after cutting out soft drinks.  All other systems are reviewed and negative.   Physical Exam: VS:  BP 106/62 mmHg  Pulse 93  Ht '5\' 9"'$  (1.753 m)  Wt 159 lb (72.122 kg)  BMI 23.47 kg/m2  SpO2 95%, BMI Body mass index is 23.47 kg/(m^2).  Wt  Readings from Last 3 Encounters:  08/09/15 159 lb (72.122 kg)  08/02/15 163 lb (73.936 kg)  07/20/15 164 lb (74.39 kg)    Appears comfortable at rest. HEENT: Conjuctivae and lids normal, oropharynx clear.  Neck: Supple, no elevated JVP, no carotid bruits.  Lungs: Nonlabored breathing at rest. Clear without rales.  Cor: RRR. No pathologic systolic murmurs. No S3 or rub.  Abd: Soft, nontender. No bruits. Normoactive bowel sounds.  Skin: Warm and dry.  Musculoskeletal: No kyphosis.  Extremities: No pitting edema.  Neuropsychiatric: Alert and oriented x3, affect appropriate.   ECG: I personally reviewed the tracing from 06/12/2014 showed sinus rhythm with incomplete right bundle branch block, left anterior fascicular block versus old inferior infarct pattern.  Recent Labwork: 07/06/2015: BUN 12; Creat 0.78; Potassium 4.6; Sodium 138     Component Value Date/Time   CHOL 226* 07/17/2014 0939   TRIG 317* 07/17/2014 0939   HDL 30* 07/17/2014 0939   CHOLHDL 7.5 07/17/2014 0939   VLDL 63* 07/17/2014 0939   LDLCALC 133* 07/17/2014 0939    Other Studies Reviewed Today:  [facility] Cardiolite 07/24/2014:  There was no ST segment deviation noted during stress.  Findings consistent with prior inferolatral myocardial infarction with mild peri-infarct ischemia.  This is a low risk study.  The left ventricular ejection fraction is normal (55-65%).  Assessment and Plan:  1. CAD status post multivessel percutaneous intervention back in 2010 as outlined above. He is symptomatically stable without angina on medical therapy and had a follow-up Cardiolite study last year which was low risk. ECG stable. Continue observation.  2. Hyperlipidemia, continues on Lipitor. Last LDL was 133. Follow-up FLP.  3. Intermittent tobacco use. We continue to discuss smoking cessation.  4. Type 2 diabetes mellitus followed by Dr. Dorris Fetch. He is on insulin. Last hemoglobin A1c was 9.3. He continues to work on  his diet.  Current medicines were reviewed with the patient today.   Orders Placed This Encounter  Procedures  . Lipid Profile  . EKG 12-Lead    Disposition: Follow-up with me in one year.  Signed, Satira Sark, MD, [facility] 08/09/2015 1:15 PM    [facility] [address], [place], [place] [address] Phone: [phone]; Fax: [phone]

## 2015-08-09 NOTE — Patient Instructions (Signed)
Your physician wants you to follow-up in:1 year You will receive a reminder letter in the mail two months in advance. If you don't receive a letter, please call our office to schedule the follow-up appointment.   Your physician recommends that you return for lab work in:  Fasting lipids    Your physician recommends that you continue on your current medications as directed. Please refer to the Current Medication list given to you today.     Thank you for choosing [facility] !

## 2015-10-10 ENCOUNTER — Other Ambulatory Visit: Payer: Self-pay | Admitting: "Endocrinology

## 2015-10-10 LAB — COMPLETE METABOLIC PANEL WITH GFR
ALK PHOS: 72 U/L (ref 40–115)
ALT: 16 U/L (ref 9–46)
AST: 14 U/L (ref 10–35)
Albumin: 4.1 g/dL (ref 3.6–5.1)
BILIRUBIN TOTAL: 0.5 mg/dL (ref 0.2–1.2)
BUN: 12 mg/dL (ref 7–25)
CALCIUM: 9.5 mg/dL (ref 8.6–10.3)
CO2: 23 mmol/L (ref 20–31)
Chloride: 104 mmol/L (ref 98–110)
Creat: 0.78 mg/dL (ref 0.70–1.33)
Glucose, Bld: 307 mg/dL — ABNORMAL HIGH (ref 65–99)
POTASSIUM: 4.8 mmol/L (ref 3.5–5.3)
SODIUM: 138 mmol/L (ref 135–146)
Total Protein: 6.8 g/dL (ref 6.1–8.1)

## 2015-10-10 LAB — HEMOGLOBIN A1C: Hemoglobin A1C: 9.1

## 2015-10-11 ENCOUNTER — Encounter: Payer: Self-pay | Admitting: "Endocrinology

## 2015-10-11 ENCOUNTER — Ambulatory Visit (INDEPENDENT_AMBULATORY_CARE_PROVIDER_SITE_OTHER): Payer: BLUE CROSS/BLUE SHIELD | Admitting: "Endocrinology

## 2015-10-11 VITALS — BP 111/69 | HR 93 | Ht 69.0 in | Wt 168.0 lb

## 2015-10-11 DIAGNOSIS — E1159 Type 2 diabetes mellitus with other circulatory complications: Secondary | ICD-10-CM

## 2015-10-11 DIAGNOSIS — I10 Essential (primary) hypertension: Secondary | ICD-10-CM

## 2015-10-11 DIAGNOSIS — F17200 Nicotine dependence, unspecified, uncomplicated: Secondary | ICD-10-CM | POA: Diagnosis not present

## 2015-10-11 DIAGNOSIS — E785 Hyperlipidemia, unspecified: Secondary | ICD-10-CM | POA: Diagnosis not present

## 2015-10-11 LAB — HEMOGLOBIN A1C
HEMOGLOBIN A1C: 9.1 % — AB (ref <5.7)
MEAN PLASMA GLUCOSE: 214 mg/dL

## 2015-10-11 NOTE — Progress Notes (Signed)
Subjective:    Patient ID: Nicholas [name], male    DOB: Jul 27, 1960,    Past Medical History:  Diagnosis Date  . Anal lesion    Anal papilla/tags - external lesion  . Colon polyp    Hyperplastic rectosigmoid polyp 9/08  . Coronary atherosclerosis of native coronary artery    DES to PLB, BMS to mid and distal RCA, DES to circumflex and distal LAD, 6/10  . GERD (gastroesophageal reflux disease)   . Helicobacter pylori (H. pylori) 09/2009   Treated with helidac  . Hiatal hernia   . Hiatal hernia   . Hyperplastic colon polyp 2008  . Mixed hyperlipidemia   . Myocardial infarction ([place])   . Type 2 diabetes mellitus ([place])    Past Surgical History:  Procedure Laterality Date  . CHOLECYSTECTOMY    . COLLAPSED LUNG     15 years ago  . COLONOSCOPY  10/12/2006   Dr. Gala Romney- rectosigmoid polyp s/phot snare polypectomy o/w normal rectum and terminal ileum. hyperplastic polyp on bx  . ESOPHAGOGASTRODUODENOSCOPY  10/12/2006   Dr. Gala Romney- examination of the tubular esophagus revealed no mucosal abnormalities. the EG junction was easily traversed. small hiatal hernia, the gastric mucosa o/w appeared normal. there was no infiltrating process or frank ulcer seen.  Geronimo Boot IN APEX OF RIGHT LUNG     Since childhood   Social History   Social History  . Marital status: Married    Spouse name: N/A  . Number of children: N/A  . Years of education: N/A   Occupational History  . Self-employed Self-Employed   Social History Main Topics  . Smoking status: Light Tobacco Smoker    Packs/day: 1.00    Years: 34.00    Types: Cigarettes  . Smokeless tobacco: Never Used  . Alcohol use No  . Drug use: No  . Sexual activity: Yes    Partners: Female    Birth control/ protection: Pill   Other Topics Concern  . None   Social History Narrative  . None   Outpatient Encounter Prescriptions as of 10/11/2015  Medication Sig  . aspirin 325 MG EC tablet Take 325 mg by mouth daily.    Marland Kitchen atorvastatin  (LIPITOR) 40 MG tablet   . clopidogrel (PLAVIX) 75 MG tablet TAKE ONE TABLET BY MOUTH ONCE DAILY.  Marland Kitchen glucose blood (BAYER CONTOUR TEST) test strip Use as instructed 4 x daily E11.65. Contour plus test strips  . Insulin Degludec 100 UNIT/ML SOPN Inject 50 Units into the skin at bedtime.  . Insulin Lispro (HUMALOG KWIKPEN [place]) Inject 10-16 Units into the skin 3 (three) times daily with meals.  Marland Kitchen NITROSTAT 0.4 MG SL tablet DISSOLVE 1 TABLET UNDER TONGUE EVERY 5 MINUTES UP TO 15 MIN FOR CHESTPAIN. IF NO RELIEF CALL 911.  . pantoprazole (PROTONIX) 40 MG tablet Take 1 tablet by mouth daily.   . traMADol (ULTRAM) 50 MG tablet Take 50 mg by mouth every 6 (six) hours as needed.   No facility-administered encounter medications on file as of 10/11/2015.    ALLERGIES: No Known Allergies VACCINATION STATUS:  There is no immunization history on file for this patient.  Diabetes  He presents for his follow-up diabetic visit. He has type 2 diabetes mellitus. Onset time: He was diagnosed at approximate age of 71 years. In this particular patient with a history of heavy alcohol use possiblity of pancreatic diabetes is high. His disease course has been stable. There are no hypoglycemic associated symptoms. Pertinent  negatives for hypoglycemia include no confusion, headaches, pallor or seizures. Pertinent negatives for diabetes include no chest pain, no fatigue, no polydipsia, no polyphagia, no polyuria and no weakness. There are no hypoglycemic complications. Symptoms are stable. Diabetic complications include heart disease. Risk factors for coronary artery disease include diabetes mellitus, dyslipidemia, hypertension, male sex, sedentary lifestyle and tobacco exposure. Current diabetic treatment includes oral agent (dual therapy). He is compliant with treatment some of the time. His weight is increasing steadily. He is following a generally unhealthy diet. When asked about meal planning, he reported none. He has not  had a previous visit with a dietitian (he did not keep appointment.). He rarely participates in exercise. His breakfast blood glucose range is generally 140-180 mg/dl. His lunch blood glucose range is generally 180-200 mg/dl. His overall blood glucose range is 180-200 mg/dl. Eye exam is current.  Hyperlipidemia  This is a chronic problem. The current episode started more than 1 year ago. Pertinent negatives include no chest pain, myalgias or shortness of breath. Current antihyperlipidemic treatment includes statins.  Hypertension  This is a chronic problem. The current episode started more than 1 year ago. Pertinent negatives include no chest pain, headaches, neck pain, palpitations or shortness of breath. Past treatments include beta blockers. Hypertensive end-organ damage includes CAD/MI.     Review of Systems  Constitutional: Negative for fatigue and unexpected weight change.  HENT: Negative for dental problem, mouth sores and trouble swallowing.   Eyes: Negative for visual disturbance.  Respiratory: Negative for cough, choking, chest tightness, shortness of breath and wheezing.   Cardiovascular: Negative for chest pain, palpitations and leg swelling.  Gastrointestinal: Negative for abdominal distention, abdominal pain, constipation, diarrhea, nausea and vomiting.  Endocrine: Negative for polydipsia, polyphagia and polyuria.  Genitourinary: Negative for dysuria, flank pain, hematuria and urgency.  Musculoskeletal: Negative for back pain, gait problem, myalgias and neck pain.  Skin: Negative for pallor, rash and wound.  Neurological: Negative for seizures, syncope, weakness, numbness and headaches.  Psychiatric/Behavioral: Negative.  Negative for confusion and dysphoric mood.    Objective:    BP 111/69   Pulse 93   Ht '5\' 9"'$  (1.753 m)   Wt 168 lb (76.2 kg)   BMI 24.81 kg/m   Wt Readings from Last 3 Encounters:  10/11/15 168 lb (76.2 kg)  08/09/15 159 lb (72.1 kg)  08/02/15 163 lb  (73.9 kg)    Physical Exam  Constitutional: He is oriented to person, place, and time. He appears well-developed and well-nourished. He is cooperative. No distress.  HENT:  Head: Normocephalic and atraumatic.  Eyes: EOM are normal.  Neck: Normal range of motion. Neck supple. No tracheal deviation present. No thyromegaly present.  Cardiovascular: Normal rate, S1 normal, S2 normal and normal heart sounds.  Exam reveals no gallop.   No murmur heard. Pulses:      Dorsalis pedis pulses are 1+ on the right side, and 1+ on the left side.       Posterior tibial pulses are 1+ on the right side, and 1+ on the left side.  Pulmonary/Chest: Breath sounds normal. No respiratory distress. He has no wheezes.  Abdominal: Soft. Bowel sounds are normal. He exhibits no distension. There is no tenderness. There is no guarding and no CVA tenderness.  Musculoskeletal: He exhibits no edema.       Right shoulder: He exhibits no swelling and no deformity.  Neurological: He is alert and oriented to person, place, and time. He has normal strength and normal  reflexes. No cranial nerve deficit or sensory deficit. Gait normal.  Skin: Skin is warm and dry. No rash noted. No cyanosis. Nails show no clubbing.  Psychiatric: He has a normal mood and affect. His speech is normal and behavior is normal. Judgment and thought content normal. Cognition and memory are normal.    Results for orders placed or performed in visit on 10/11/15  Hemoglobin A1c  Result Value Ref Range   Hemoglobin A1C 9.1    Diabetic Labs (most recent): Lab Results  Component Value Date   HGBA1C 9.1 10/10/2015   HGBA1C 9.3 (H) 07/06/2015   HGBA1C 9.5 (H) 03/15/2015   Lipid Panel     Component Value Date/Time   CHOL 226 (H) 07/17/2014 0939   TRIG 317 (H) 07/17/2014 0939   HDL 30 (L) 07/17/2014 0939   CHOLHDL 7.5 07/17/2014 0939   VLDL 63 (H) 07/17/2014 0939   LDLCALC 133 (H) 07/17/2014 0939      Assessment & Plan:   1. Type 2 diabetes  mellitus with vascular disease (HCC) -His diabetes is  complicated by coronary artery disease and patient remains at a high risk for more acute and chronic complications of diabetes which include CAD, CVA, CKD, retinopathy, and neuropathy. These are all discussed in detail with the patient.  --His  last a1c Remains high at 9.1% largely unchanged from last visit when it was 9.5%.  - I have re-counseled the patient on diet management  by adopting a carbohydrate restricted / protein rich  Diet.  - Suggestion is made for patient to avoid simple carbohydrates   from their diet including Cakes , Desserts, Ice Cream,  Soda (  diet and regular) , Sweet Tea , Candies,  Chips, Cookies, Artificial Sweeteners,   and "Sugar-free" Products .  This will help patient to have stable blood glucose profile and potentially avoid unintended  Weight gain.  - Patient is advised to stick to a routine mealtimes to eat 3 meals  a day and avoid unnecessary snacks ( to snack only to correct hypoglycemia).  - The patient  will be  scheduled with Jearld Fenton, RDN, CDE for individualized DM education.  - I have approached patient with the following individualized plan to manage diabetes and patient agrees.   - He did not administer insulin with supper.  His average blood glucose is  Better, but still above  175-200, he will Continue to require basal/ bolus insulin.  - I asked him to continue  Tresiba 50 units at bedtime ,  - continue NovoLog  10 units 3 times a day before meals for pre-meal blood glucose above 90 mg/dL, associated with strict monitoring of blood glucose before meals and at bedtime.   -He is encouraged to call clinic for blood glucose levels less than 70 or above 200 mg /dl. - I will discontinue his   Invokana due to weight loss, unintentional. -He has now more steady body weight although he mentioned that he lost approximately 50 pounds over 5 years.  -Patient  is not a candidate for  incretin  therapy. -Target numbers for A1c, LDL, HDL, Triglycerides, Waist Circumference were discussed in detail.   2) BP/HTN: Controlled. Continue current medications including beta blockers. 3) Lipids/HPL:  continue statins. 4)  Weight/Diet:   he missed his CDE consult, exercise, and carbohydrates information provided.  5) Chronic Care/Health Maintenance:  -Patient is  Statin medications and encouraged to continue to follow up with Ophthalmology, Podiatrist at least yearly or according to  recommendations, and advised to quit smoking. I have recommended yearly flu vaccine and pneumonia vaccination at least every 5 years; moderate intensity exercise for up to 150 minutes weekly; and  sleep for at least 7 hours a day.  I advised patient to maintain close follow up with their PCP for primary care needs.  Patient is asked to bring meter and  blood glucose logs during their next visit.   Follow up plan: Return in about 3 months (around 01/10/2016) for meter, and logs.  Glade Lloyd, MD Phone: [phone]  Fax: [phone]   10/11/2015, 8:41 AM

## 2015-11-02 ENCOUNTER — Other Ambulatory Visit: Payer: Self-pay | Admitting: Cardiology

## 2016-01-11 ENCOUNTER — Ambulatory Visit: Payer: BLUE CROSS/BLUE SHIELD | Admitting: "Endocrinology

## 2016-02-01 ENCOUNTER — Ambulatory Visit: Payer: BLUE CROSS/BLUE SHIELD | Admitting: "Endocrinology

## 2016-02-09 ENCOUNTER — Emergency Department (HOSPITAL_COMMUNITY): Payer: BLUE CROSS/BLUE SHIELD

## 2016-02-09 ENCOUNTER — Encounter (HOSPITAL_COMMUNITY): Payer: Self-pay | Admitting: Emergency Medicine

## 2016-02-09 ENCOUNTER — Inpatient Hospital Stay (HOSPITAL_COMMUNITY)
Admission: EM | Admit: 2016-02-09 | Discharge: 2016-02-12 | DRG: 041 | Disposition: A | Discharge status: Home / Self Care | Payer: BLUE CROSS/BLUE SHIELD | Attending: Internal Medicine | Admitting: Internal Medicine

## 2016-02-09 DIAGNOSIS — I10 Essential (primary) hypertension: Secondary | ICD-10-CM | POA: Diagnosis present

## 2016-02-09 DIAGNOSIS — R4789 Other speech disturbances: Secondary | ICD-10-CM | POA: Diagnosis present

## 2016-02-09 DIAGNOSIS — Z9119 Patient's noncompliance with other medical treatment and regimen: Secondary | ICD-10-CM | POA: Diagnosis not present

## 2016-02-09 DIAGNOSIS — E1159 Type 2 diabetes mellitus with other circulatory complications: Secondary | ICD-10-CM | POA: Diagnosis present

## 2016-02-09 DIAGNOSIS — E440 Moderate protein-calorie malnutrition: Secondary | ICD-10-CM | POA: Insufficient documentation

## 2016-02-09 DIAGNOSIS — I252 Old myocardial infarction: Secondary | ICD-10-CM

## 2016-02-09 DIAGNOSIS — G459 Transient cerebral ischemic attack, unspecified: Secondary | ICD-10-CM | POA: Diagnosis not present

## 2016-02-09 DIAGNOSIS — T383X6A Underdosing of insulin and oral hypoglycemic [antidiabetic] drugs, initial encounter: Secondary | ICD-10-CM | POA: Diagnosis present

## 2016-02-09 DIAGNOSIS — E782 Mixed hyperlipidemia: Secondary | ICD-10-CM | POA: Diagnosis present

## 2016-02-09 DIAGNOSIS — Z833 Family history of diabetes mellitus: Secondary | ICD-10-CM

## 2016-02-09 DIAGNOSIS — I639 Cerebral infarction, unspecified: Secondary | ICD-10-CM

## 2016-02-09 DIAGNOSIS — E785 Hyperlipidemia, unspecified: Secondary | ICD-10-CM | POA: Diagnosis present

## 2016-02-09 DIAGNOSIS — K219 Gastro-esophageal reflux disease without esophagitis: Secondary | ICD-10-CM | POA: Diagnosis present

## 2016-02-09 DIAGNOSIS — R0989 Other specified symptoms and signs involving the circulatory and respiratory systems: Secondary | ICD-10-CM | POA: Diagnosis not present

## 2016-02-09 DIAGNOSIS — R51 Headache: Secondary | ICD-10-CM

## 2016-02-09 DIAGNOSIS — R519 Headache, unspecified: Secondary | ICD-10-CM

## 2016-02-09 DIAGNOSIS — Y92009 Unspecified place in unspecified non-institutional (private) residence as the place of occurrence of the external cause: Secondary | ICD-10-CM

## 2016-02-09 DIAGNOSIS — I63432 Cerebral infarction due to embolism of left posterior cerebral artery: Principal | ICD-10-CM | POA: Diagnosis present

## 2016-02-09 DIAGNOSIS — Z7902 Long term (current) use of antithrombotics/antiplatelets: Secondary | ICD-10-CM

## 2016-02-09 DIAGNOSIS — Z7982 Long term (current) use of aspirin: Secondary | ICD-10-CM

## 2016-02-09 DIAGNOSIS — F17200 Nicotine dependence, unspecified, uncomplicated: Secondary | ICD-10-CM | POA: Diagnosis present

## 2016-02-09 DIAGNOSIS — E1165 Type 2 diabetes mellitus with hyperglycemia: Secondary | ICD-10-CM | POA: Diagnosis present

## 2016-02-09 DIAGNOSIS — Z955 Presence of coronary angioplasty implant and graft: Secondary | ICD-10-CM

## 2016-02-09 DIAGNOSIS — Z91199 Patient's noncompliance with other medical treatment and regimen due to unspecified reason: Secondary | ICD-10-CM

## 2016-02-09 DIAGNOSIS — Z8249 Family history of ischemic heart disease and other diseases of the circulatory system: Secondary | ICD-10-CM

## 2016-02-09 DIAGNOSIS — Z8673 Personal history of transient ischemic attack (TIA), and cerebral infarction without residual deficits: Secondary | ICD-10-CM | POA: Diagnosis present

## 2016-02-09 DIAGNOSIS — I2510 Atherosclerotic heart disease of native coronary artery without angina pectoris: Secondary | ICD-10-CM | POA: Diagnosis present

## 2016-02-09 DIAGNOSIS — Z91120 Patient's intentional underdosing of medication regimen due to financial hardship: Secondary | ICD-10-CM

## 2016-02-09 DIAGNOSIS — F17210 Nicotine dependence, cigarettes, uncomplicated: Secondary | ICD-10-CM | POA: Diagnosis present

## 2016-02-09 DIAGNOSIS — M6281 Muscle weakness (generalized): Secondary | ICD-10-CM

## 2016-02-09 DIAGNOSIS — R4189 Other symptoms and signs involving cognitive functions and awareness: Secondary | ICD-10-CM | POA: Diagnosis present

## 2016-02-09 LAB — DIFFERENTIAL
BASOS PCT: 0 %
Basophils Absolute: 0 10*3/uL (ref 0.0–0.1)
Eosinophils Absolute: 0.1 10*3/uL (ref 0.0–0.7)
Eosinophils Relative: 1 %
Lymphocytes Relative: 35 %
Lymphs Abs: 3.5 10*3/uL (ref 0.7–4.0)
MONO ABS: 0.7 10*3/uL (ref 0.1–1.0)
Monocytes Relative: 7 %
NEUTROS ABS: 5.4 10*3/uL (ref 1.7–7.7)
Neutrophils Relative %: 57 %

## 2016-02-09 LAB — I-STAT TROPONIN, ED: Troponin i, poc: 0.02 ng/mL (ref 0.00–0.08)

## 2016-02-09 LAB — RAPID URINE DRUG SCREEN, HOSP PERFORMED
AMPHETAMINES: NOT DETECTED
BENZODIAZEPINES: NOT DETECTED
Barbiturates: NOT DETECTED
COCAINE: NOT DETECTED
OPIATES: NOT DETECTED
Tetrahydrocannabinol: NOT DETECTED

## 2016-02-09 LAB — CBC
HCT: 46.2 % (ref 39.0–52.0)
Hemoglobin: 15.9 g/dL (ref 13.0–17.0)
MCH: 27.9 pg (ref 26.0–34.0)
MCHC: 34.4 g/dL (ref 30.0–36.0)
MCV: 81.2 fL (ref 78.0–100.0)
PLATELETS: 155 10*3/uL (ref 150–400)
RBC: 5.69 MIL/uL (ref 4.22–5.81)
RDW: 13.6 % (ref 11.5–15.5)
WBC: 9.8 10*3/uL (ref 4.0–10.5)

## 2016-02-09 LAB — I-STAT CHEM 8, ED
BUN: 10 mg/dL (ref 6–20)
CALCIUM ION: 1.16 mmol/L (ref 1.15–1.40)
CHLORIDE: 99 mmol/L — AB (ref 101–111)
Creatinine, Ser: 0.7 mg/dL (ref 0.61–1.24)
Glucose, Bld: 249 mg/dL — ABNORMAL HIGH (ref 65–99)
HEMATOCRIT: 48 % (ref 39.0–52.0)
Hemoglobin: 16.3 g/dL (ref 13.0–17.0)
POTASSIUM: 3.9 mmol/L (ref 3.5–5.1)
SODIUM: 137 mmol/L (ref 135–145)
TCO2: 27 mmol/L (ref 0–100)

## 2016-02-09 LAB — COMPREHENSIVE METABOLIC PANEL
ALT: 21 U/L (ref 17–63)
ANION GAP: 10 (ref 5–15)
AST: 16 U/L (ref 15–41)
Albumin: 4.1 g/dL (ref 3.5–5.0)
Alkaline Phosphatase: 75 U/L (ref 38–126)
BUN: 11 mg/dL (ref 6–20)
CHLORIDE: 100 mmol/L — AB (ref 101–111)
CO2: 27 mmol/L (ref 22–32)
CREATININE: 0.73 mg/dL (ref 0.61–1.24)
Calcium: 9.6 mg/dL (ref 8.9–10.3)
GFR calc non Af Amer: >60 mL/min (ref >60)
Glucose, Bld: 260 mg/dL — ABNORMAL HIGH (ref 65–99)
POTASSIUM: 3.8 mmol/L (ref 3.5–5.1)
SODIUM: 137 mmol/L (ref 135–145)
Total Bilirubin: 0.5 mg/dL (ref 0.3–1.2)
Total Protein: 7.1 g/dL (ref 6.5–8.1)

## 2016-02-09 LAB — URINALYSIS, ROUTINE W REFLEX MICROSCOPIC
BACTERIA UA: NONE SEEN
Bilirubin Urine: NEGATIVE
Glucose, UA: >=500 mg/dL — AB
Hgb urine dipstick: NEGATIVE
Ketones, ur: 5 mg/dL — AB
Leukocytes, UA: NEGATIVE
Nitrite: NEGATIVE
PH: 6 (ref 5.0–8.0)
Protein, ur: NEGATIVE mg/dL
SPECIFIC GRAVITY, URINE: 1.016 (ref 1.005–1.030)

## 2016-02-09 LAB — GLUCOSE, CAPILLARY: Glucose-Capillary: 246 mg/dL — ABNORMAL HIGH (ref 65–99)

## 2016-02-09 LAB — APTT: APTT: 28 s (ref 24–36)

## 2016-02-09 LAB — CBG MONITORING, ED: Glucose-Capillary: 265 mg/dL — ABNORMAL HIGH (ref 65–99)

## 2016-02-09 LAB — ETHANOL

## 2016-02-09 LAB — PROTIME-INR
INR: 0.95
PROTHROMBIN TIME: 12.7 s (ref 11.4–15.2)

## 2016-02-09 MED ORDER — MECLIZINE HCL 25 MG PO TABS: 25.0000 mg | ORAL_TABLET | Freq: Three times a day (TID) | ORAL | Status: DC | PRN

## 2016-02-09 MED ORDER — ACETAMINOPHEN 325 MG PO TABS
650.0000 mg | ORAL_TABLET | ORAL | Status: DC | PRN
  Administered 2016-02-10 – 2016-02-11 (×2): 650 mg via ORAL
  Filled 2016-02-09 (×2): qty 2

## 2016-02-09 MED ORDER — ACETAMINOPHEN 650 MG RE SUPP
650.0000 mg | RECTAL | Status: DC | PRN
Start: 2016-02-09 — End: 2016-02-12

## 2016-02-09 MED ORDER — PANTOPRAZOLE SODIUM 40 MG PO TBEC
40.0000 mg | DELAYED_RELEASE_TABLET | Freq: Every day | ORAL | Status: DC
  Administered 2016-02-10 – 2016-02-12 (×3): 40 mg via ORAL
  Filled 2016-02-09 (×3): qty 1

## 2016-02-09 MED ORDER — SENNOSIDES-DOCUSATE SODIUM 8.6-50 MG PO TABS: 1.0000 | ORAL_TABLET | Freq: Every evening | ORAL | Status: DC | PRN

## 2016-02-09 MED ORDER — ENOXAPARIN SODIUM 40 MG/0.4ML ~~LOC~~ SOLN
40.0000 mg | SUBCUTANEOUS | Status: DC
  Administered 2016-02-10 – 2016-02-12 (×3): 40 mg via SUBCUTANEOUS
  Filled 2016-02-09 (×3): qty 0.4

## 2016-02-09 MED ORDER — TRAMADOL HCL 50 MG PO TABS
50.0000 mg | ORAL_TABLET | Freq: Every day | ORAL | Status: DC
  Administered 2016-02-10: 50 mg via ORAL
  Filled 2016-02-09: qty 1

## 2016-02-09 MED ORDER — INSULIN ASPART 100 UNIT/ML ~~LOC~~ SOLN
0.0000 [IU] | Freq: Every day | SUBCUTANEOUS | Status: DC
  Administered 2016-02-10: 3 [IU] via SUBCUTANEOUS
  Administered 2016-02-10 – 2016-02-11 (×2): 2 [IU] via SUBCUTANEOUS

## 2016-02-09 MED ORDER — ACETAMINOPHEN 160 MG/5ML PO SOLN: 650.0000 mg | ORAL | Status: DC | PRN

## 2016-02-09 MED ORDER — DIPHENHYDRAMINE HCL 50 MG/ML IJ SOLN
25.0000 mg | Freq: Once | INTRAMUSCULAR | Status: AC
  Administered 2016-02-09: 25 mg via INTRAVENOUS
  Filled 2016-02-09: qty 1

## 2016-02-09 MED ORDER — ASPIRIN EC 325 MG PO TBEC
325.0000 mg | DELAYED_RELEASE_TABLET | Freq: Every day | ORAL | Status: DC
  Administered 2016-02-10 – 2016-02-12 (×3): 325 mg via ORAL
  Filled 2016-02-09 (×3): qty 1

## 2016-02-09 MED ORDER — STROKE: EARLY STAGES OF RECOVERY BOOK
Freq: Once | Status: AC
  Administered 2016-02-10: 11:00:00
  Filled 2016-02-09: qty 1

## 2016-02-09 MED ORDER — MORPHINE SULFATE (PF) 2 MG/ML IV SOLN
2.0000 mg | INTRAVENOUS | Status: DC | PRN
  Administered 2016-02-09 – 2016-02-10 (×4): 2 mg via INTRAVENOUS
  Filled 2016-02-09 (×5): qty 1

## 2016-02-09 MED ORDER — NICOTINE 21 MG/24HR TD PT24
21.0000 mg | MEDICATED_PATCH | Freq: Every day | TRANSDERMAL | Status: DC
  Administered 2016-02-10 – 2016-02-12 (×3): 21 mg via TRANSDERMAL
  Filled 2016-02-09 (×3): qty 1

## 2016-02-09 MED ORDER — PROCHLORPERAZINE EDISYLATE 5 MG/ML IJ SOLN
10.0000 mg | Freq: Once | INTRAMUSCULAR | Status: AC
  Administered 2016-02-09: 10 mg via INTRAVENOUS
  Filled 2016-02-09: qty 2

## 2016-02-09 MED ORDER — INSULIN ASPART 100 UNIT/ML ~~LOC~~ SOLN
0.0000 [IU] | Freq: Three times a day (TID) | SUBCUTANEOUS | Status: DC
  Administered 2016-02-10: 5 [IU] via SUBCUTANEOUS
  Administered 2016-02-10: 8 [IU] via SUBCUTANEOUS
  Administered 2016-02-11 (×3): 5 [IU] via SUBCUTANEOUS

## 2016-02-09 MED ORDER — CLOPIDOGREL BISULFATE 75 MG PO TABS
75.0000 mg | ORAL_TABLET | Freq: Every day | ORAL | Status: DC
  Administered 2016-02-10 – 2016-02-12 (×3): 75 mg via ORAL
  Filled 2016-02-09 (×3): qty 1

## 2016-02-09 MED ORDER — SODIUM CHLORIDE 0.9 % IV SOLN
INTRAVENOUS | Status: DC
  Administered 2016-02-10 (×2): via INTRAVENOUS

## 2016-02-09 NOTE — ED Triage Notes (Signed)
Per wife pt laid down for a nap, when woke up extremely confused to normal everyday tasks, speech slow but not slurred.  C/O headache and left side facial numbness.

## 2016-02-09 NOTE — H&P (Signed)
History and Physical    Nicholas [name] EPP:[id number] DOB: 1961/01/02 DOA: 02/09/2016  PCP: Purvis Kilts, MD Consultants:  [facility] - cardiology; endocrinologist -- [place] Patient coming from: home - lives with wife; NOK: wife, Maudie Mercury, [phone]  Chief Complaint: possible stroke  HPI: Nicholas [name] is a 56 y.o. male with medical history significant of DM, HLD, HTN, CAD with 5 stents in 2010 presenting with possible stroke.  About 2:30 pm he came home and spoke to wife on the phone, was fine. Ate and went to sleep.  She went to the store and briefly talked to him about 5.  Didn't notice anything unusual.  Went outside while he napped in the chair.  She called about 5:50 and he c/o bad headache so she came back in.  Very confused, ?low sugar.  Couldn't remember names, didn't know where he was.  Checked glucose, 215 (normal for him).   Called a nurse friend who suggested ER visit.  No dysphagia.  No slurring, just slow.  +left frontal headache and down his face.  No h/o similar.  He does not unusually get headaches.  Walked slowly but normally.  Hands shaking, very disoriented, couldn't remember how to check his blood sugar.   ED Course: Per Dr. Sabra Heck: The patient presented as a code stroke, this was activated very soon after arrival. He had no focal abnormalities except for his difficulty answering questions which seem to be quite slowed. Other than that he had normal neurologic findings. The CT scan was negative, his lab work was rather unremarkable except for some hyper per glycemia, the neurologist recommended that the patient be admitted for a TIA workup. I have recommended this as well, discussed this with the hospitalist, Dr. Lorin Mercy will kindly come and see the patient.   Review of Systems: As per HPI; otherwise 10 point review of systems reviewed and negative.   Ambulatory Status:  ambualtes without assistance prior  Past Medical History:  Diagnosis Date  . Anal lesion    Anal  papilla/tags - external lesion  . Colon polyp    Hyperplastic rectosigmoid polyp 9/08  . Coronary atherosclerosis of native coronary artery    DES to PLB, BMS to mid and distal RCA, DES to circumflex and distal LAD, 6/10 (5 stents)  . GERD (gastroesophageal reflux disease)   . Helicobacter pylori (H. pylori) 09/2009   Treated with helidac  . Hiatal hernia   . Hyperplastic colon polyp 2008  . Mixed hyperlipidemia   . Myocardial infarction 06/2008  . Type 2 diabetes mellitus ([place])     Past Surgical History:  Procedure Laterality Date  . CHOLECYSTECTOMY    . COLLAPSED LUNG     15 years ago  . COLONOSCOPY  10/12/2006   Dr. Gala Romney- rectosigmoid polyp s/phot snare polypectomy o/w normal rectum and terminal ileum. hyperplastic polyp on bx  . ESOPHAGOGASTRODUODENOSCOPY  10/12/2006   Dr. Gala Romney- examination of the tubular esophagus revealed no mucosal abnormalities. the EG junction was easily traversed. small hiatal hernia, the gastric mucosa o/w appeared normal. there was no infiltrating process or frank ulcer seen.  Geronimo Boot IN APEX OF RIGHT LUNG     Since childhood    Social History   Social History  . Marital status: Married    Spouse name: N/A  . Number of children: N/A  . Years of education: N/A   Occupational History  . maintenance, parking lots Self-Employed   Social History Main Topics  . Smoking status: Heavy  Tobacco Smoker    Packs/day: 3.00    Years: 34.00    Types: Cigarettes  . Smokeless tobacco: Never Used  . Alcohol use No  . Drug use: No  . Sexual activity: Yes    Partners: Female    Birth control/ protection: Pill   Other Topics Concern  . Not on file   Social History Narrative  . No narrative on file    No Known Allergies  Family History  Problem Relation Age of Onset  . Diabetes Mother   . Heart disease Father   . Cancer Brother   . CVA Neg Hx     Prior to Admission medications   Medication Sig Start Date End Date Taking? Authorizing Provider    acetaminophen (TYLENOL) 500 MG tablet Take 1,000 mg by mouth every 6 (six) hours as needed.   Yes Historical Provider, MD  aspirin 325 MG EC tablet Take 325 mg by mouth daily.     Yes Historical Provider, MD  clopidogrel (PLAVIX) 75 MG tablet TAKE ONE TABLET BY MOUTH ONCE DAILY. 07/10/15  Yes Satira Sark, MD  glucose blood (BAYER CONTOUR TEST) test strip Use as instructed 4 x daily E11.65. Contour plus test strips 07/23/15  Yes Cassandria Anger, MD  pantoprazole (PROTONIX) 40 MG tablet Take 1 tablet by mouth daily.  04/11/10  Yes Historical Provider, MD  traMADol (ULTRAM) 50 MG tablet Take 50 mg by mouth daily.    Yes Historical Provider, MD  NITROSTAT 0.4 MG SL tablet DISSOLVE 1 TABLET UNDER TONGUE EVERY 5 MINUTES UP TO [place]. IF NO RELIEF CALL 911. 11/02/15   Satira Sark, MD    Physical Exam: Vitals:   02/09/16 2100 02/09/16 2130 02/09/16 2145 02/09/16 2228  BP: 126/84 125/79  130/87  Pulse: 91 84 79 83  Resp: '20 15 19 18  '$ Temp:      SpO2: 96% 96% 96% 99%  Weight:      Height:         General:  Appears calm but uncomfortable; he prefers to be in the dark and very still with his eyes closed and speaks very little Eyes:  PERRL, EOMI, normal lids, iris ENT:  grossly normal hearing, lips & tongue, mmm Neck:  no LAD, masses or thyromegaly; R-sided carotid bruit faintly noticable Cardiovascular:  RRR, no m/r/g. No LE edema.  Respiratory:  CTA bilaterally, no w/r/r. Normal respiratory effort. Abdomen:  soft, ntnd, NABS Skin:  no rash or induration seen on limited exam Musculoskeletal:  grossly normal tone BUE/BLE, good ROM, no bony abnormality Psychiatric:  Blunted  affect, speech slowed and sparse but without slurring, AOx3 Neurologic:  CN 2-12 grossly intact other than possible very mild facial droop on the left, moves all extremities in coordinated fashion, sensation intact  Labs on Admission: I have personally reviewed following labs and imaging  studies  CBC:  Recent Labs Lab 02/09/16 1959 02/09/16 2006  WBC 9.8  --   NEUTROABS 5.4  --   HGB 15.9 16.3  HCT 46.2 48.0  MCV 81.2  --   PLT 155  --    Basic Metabolic Panel:  Recent Labs Lab 02/09/16 1959 02/09/16 2006  NA 137 137  K 3.8 3.9  CL 100* 99*  CO2 27  --   GLUCOSE 260* 249*  BUN 11 10  CREATININE 0.73 0.70  CALCIUM 9.6  --    GFR: Estimated Creatinine Clearance: 96.4 mL/min (by C-G formula based on  SCr of 0.7 mg/dL). Liver Function Tests:  Recent Labs Lab 02/09/16 1959  AST 16  ALT 21  ALKPHOS 75  BILITOT 0.5  PROT 7.1  ALBUMIN 4.1   No results for input(s): LIPASE, AMYLASE in the last 168 hours. No results for input(s): AMMONIA in the last 168 hours. Coagulation Profile:  Recent Labs Lab 02/09/16 1959  INR 0.95   Cardiac Enzymes: No results for input(s): CKTOTAL, CKMB, CKMBINDEX, TROPONINI in the last 168 hours. BNP (last 3 results) No results for input(s): PROBNP in the last 8760 hours. HbA1C: No results for input(s): HGBA1C in the last 72 hours. CBG:  Recent Labs Lab 02/09/16 1952  GLUCAP 265*   Lipid Profile: No results for input(s): CHOL, HDL, LDLCALC, TRIG, CHOLHDL, LDLDIRECT in the last 72 hours. Thyroid Function Tests: No results for input(s): TSH, T4TOTAL, FREET4, T3FREE, THYROIDAB in the last 72 hours. Anemia Panel: No results for input(s): VITAMINB12, FOLATE, FERRITIN, TIBC, IRON, RETICCTPCT in the last 72 hours. Urine analysis: No results found for: COLORURINE, APPEARANCEUR, LABSPEC, PHURINE, GLUCOSEU, HGBUR, BILIRUBINUR, KETONESUR, PROTEINUR, UROBILINOGEN, NITRITE, LEUKOCYTESUR  Creatinine Clearance: Estimated Creatinine Clearance: 96.4 mL/min (by C-G formula based on SCr of 0.7 mg/dL).  Sepsis Labs: '@LABRCNTIP'$ (procalcitonin:4,lacticidven:4) )No results found for this or any previous visit (from the past 240 hour(s)).   Radiological Exams on Admission: Ct Head Code Stroke W/o Cm  Result Date:  02/09/2016 CLINICAL DATA:  Code stroke. Sudden onset of headaches with confusion. EXAM: CT HEAD WITHOUT CONTRAST TECHNIQUE: Contiguous axial images were obtained from the base of the skull through the vertex without intravenous contrast. COMPARISON:  None. FINDINGS: Brain: No evidence of acute infarction, hemorrhage, hydrocephalus, extra-axial collection or mass lesion/mass effect. Normal cerebral volume. No white matter disease. Vascular: No hyperdense vessel or unexpected calcification. Skull: Normal. Negative for fracture or focal lesion. Sinuses/Orbits: No acute finding. Other: None. ASPECTS [facility] Stroke Program Early CT Score) - Ganglionic level infarction (caudate, lentiform nuclei, internal capsule, insula, M1-M3 cortex): 7 - Supraganglionic infarction (M4-M6 cortex): 3 Total score (0-10 with 10 being normal): 10 IMPRESSION: 1. Negative exam. 2. ASPECTS is 10. 3. These results were called by telephone at the time of interpretation on 02/09/2016 at 8:23 pm to Dr. Noemi Chapel , who verbally acknowledged these results. Electronically Signed   By: Staci Righter M.D.   On: 02/09/2016 20:25    EKG: Independently reviewed.  Sinus tachycardia with rate 103;  no evidence of acute ischemia  Assessment/Plan Principal Problem:   TIA (transient ischemic attack) Active Problems:   TOBACCO ABUSE   Essential hypertension, benign   Hyperlipidemia   Right carotid bruit   Non compliance with medical treatment   TIA vs. CVA -Symptoms are concerning for TIA/CVA, possibly frontal lobe -Will place in observation status for CVA/TIA evaluation -Telemetry monitoring -MRI/MRA -Carotid dopplers -Echo -Risk stratification with FLP, A1c; will also check TSH and UDS -Takes ASA and Plavix daily; if CVA, will need to carefully consider options -PT/OT/ST/Nutrition Consults -If MRI is negative for CVA and patient is continuing to have pain, consider treatment for trigeminal neuralgia -We discussed his options of  remaining in [place] with the possibility of delay in diagnostics (Echo and/or carotid dopplers) and lack of neurologist; however, patient is comfortable remaining here for evaluation at this time and may reconsider based on results of testing, if needed. -He also threatened to leave AMA; I strongly advised against that and assured him that we will do our best to facilitate his work-up as quickly as  possible and arrange for discharge when safe  HTN -Allow permissive HTN -Treat BP only if >220/120, and then with goal of 15% reduction -He does not currently appear to take BP medications, including Lisinopril; however, his endocrinology note from 9/17 indicates that he is to continue medications including beta blocker -The patient reports that he did not like how the beta blocker made him feel and so discontinued it shortly after it was prescribed  HLD -Check FLP -Will plan to start statin therapy if FLP is not at goal -Of note, his prior endocrinology note indicates that he was taking Lipitor in the past - although this is not currently listed on his medication list; he reports that he discontinued it about a year ago  DM -Glucose 249, 260 -A1c 9.1 in 9/17 -His endocrinology note from 9/17 indicates that he is supposed to be taking Tresiba 50 units qhs and Novolog 10 units TID -As above, there is no indication on his medication list that he is actually taking these medications -He reports that he discontinued insulin about a month ago  R carotid bruit -There was a faint right-sided carotid bruit detected -This can be further evaluated with carotid doppler, as ordered, and could be the source for his CVA  Tobacco dependence -Encourage cessation.  This was discussed with the patient and should be reviewed on an ongoing basis.   -Patch ordered - although with his smoking 3 ppd(!), the patch is likely to be grossly insufficient.  Non-Compliance -The patient continues to smoke excessively  despite prior MI/stents and is not taking medications as prescribed -He also discontinued his insulin recently -This begs the question of whether he is depressed and possibly even suicidal -Would suggest consideration of psychiatric consultation prior to discharge (and possibly even IVC if the patient wants to leave AMA)  DVT prophylaxis: Lovenox  Code Status: Full - confirmed with patient/family Family Communication: Wife, daughter, son-in-law present throughout evaluation Disposition Plan:  Home once clinically improved Consults called: PT/OT/ST/Nutrition  Admission status: It is my clinical opinion that referral for OBSERVATION is reasonable and necessary in this patient based on the above information provided. The aforementioned taken together are felt to place the patient at high risk for further clinical deterioration. However it is anticipated that the patient may be medically stable for discharge from the hospital within 24 to 48 hours.    Karmen Bongo MD [facility]  If 7PM-7AM, please contact night-coverage [email or web address] Password [facility]  02/09/2016, 10:43 PM

## 2016-02-09 NOTE — ED Notes (Signed)
C/o left HA suddenly at 1815-reported by wife.  Last seen normal around 1700-1730, pt alert and oriented at present but slow to respond, pt stated that "I feel like I have been drinking, which I don't", pt denies numbness, same sensation to bilateral sides, denies any blurry vision, denies CP.  Wife reported that pt had some confusion

## 2016-02-09 NOTE — ED Provider Notes (Signed)
[facility] DEPT Provider Note   CSN: [id number] Arrival date & time: 02/09/16  1942   History   Chief Complaint Chief Complaint  Patient presents with  . Code Stroke    HPI Nicholas [name] is a 56 y.o. male.  HPI  The patient is a 12 [place] male who is known to have coronary disease status post multiple stenting of his coronary arteries and on Plavix as well as a history of hypertension on antihypertensives as well as a heavy smoker and a diabetic on insulin. According to the patient and his significant other with a clear-cut as the patient is really the primary historian he had been doing well today, in his normal state of health, at approximately 5:00 he was last seen normal at around a short time later his wife came in from outside and when she went to talk to him he was having significant amounts of confusion on the milligrams of very good friends as well as having difficulty answering simple questions. She denies that there is any difficulty with facial droop, there is been no systemic symptoms including no fevers chills nausea vomiting diarrhea or coughing or difficulty breathing and he denies chest pain. The symptoms have been rather persistent, he has not had slurred speech but has some difficulty expressing himself though eventually comes up with a right answer most of the time. He denies any prior strokes. His symptoms persisted, therefore improving. It has been approximately 3 hours since last seen normal according to the wife's history of 5:00 being less than normal. Of note the patient had been out earlier in the day driving with her from taking him summer he needed to go there was no difficulty with this. He did not have breakfast but rather had his first meal around lunchtime.  Past Medical History:  Diagnosis Date  . Anal lesion    Anal papilla/tags - external lesion  . Colon polyp    Hyperplastic rectosigmoid polyp 9/08  . Coronary atherosclerosis of native coronary  artery    DES to PLB, BMS to mid and distal RCA, DES to circumflex and distal LAD, 6/10  . GERD (gastroesophageal reflux disease)   . Helicobacter pylori (H. pylori) 09/2009   Treated with helidac  . Hiatal hernia   . Hiatal hernia   . Hyperplastic colon polyp 2008  . Mixed hyperlipidemia   . Myocardial infarction   . Type 2 diabetes mellitus [facility])     Patient Active Problem List   Diagnosis Date Noted  . Type 2 diabetes mellitus with vascular disease ([place]) 12/12/2014  . Essential hypertension, benign 12/12/2014  . Hyperlipidemia 12/12/2014  . 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 TOBACCO ABUSE 07/26/2008  . CORONARY ATHEROSCLEROSIS NATIVE CORONARY ARTERY 07/26/2008  . GERD 07/25/2008  . BACK PAIN, CHRONIC 07/25/2008    Past Surgical History:  Procedure Laterality Date  . CHOLECYSTECTOMY    . COLLAPSED LUNG     15 years ago  . COLONOSCOPY  10/12/2006   Dr. Gala Romney- rectosigmoid polyp s/phot snare polypectomy o/w normal rectum and terminal ileum. hyperplastic polyp on bx  . ESOPHAGOGASTRODUODENOSCOPY  10/12/2006   Dr. Gala Romney- examination of the tubular esophagus revealed no mucosal abnormalities. the EG junction was easily traversed. small hiatal hernia, the gastric mucosa o/w appeared normal. there was no infiltrating process or frank ulcer seen.  Geronimo Boot IN APEX OF RIGHT LUNG     Since childhood       Home Medications    Prior to Admission  medications   Medication Sig Start Date End Date Taking? Authorizing Provider  acetaminophen (TYLENOL) 500 MG tablet Take 1,000 mg by mouth every 6 (six) hours as needed.   Yes Historical Provider, MD  aspirin 325 MG EC tablet Take 325 mg by mouth daily.     Yes Historical Provider, MD  clopidogrel (PLAVIX) 75 MG tablet TAKE ONE TABLET BY MOUTH ONCE DAILY. 07/10/15  Yes Satira Sark, MD  glucose blood (BAYER CONTOUR TEST) test strip Use as instructed 4 x daily E11.65. Contour plus test strips 07/23/15  Yes Cassandria Anger, MD    pantoprazole (PROTONIX) 40 MG tablet Take 1 tablet by mouth daily.  04/11/10  Yes Historical Provider, MD  traMADol (ULTRAM) 50 MG tablet Take 50 mg by mouth daily.    Yes Historical Provider, MD  NITROSTAT 0.4 MG SL tablet DISSOLVE 1 TABLET UNDER TONGUE EVERY 5 MINUTES UP TO [place]. IF NO RELIEF CALL 911. 11/02/15   Satira Sark, MD    Family History Family History  Problem Relation Age of Onset  . Diabetes Mother   . Heart disease Father   . Cancer Brother     Social History Social History  Substance Use Topics  . Smoking status: Heavy Tobacco Smoker    Packs/day: 3.00    Years: 34.00    Types: Cigarettes  . Smokeless tobacco: Never Used  . Alcohol use No     Allergies   Patient has no known allergies.   Review of Systems Review of Systems  All other systems reviewed and are negative.    Physical Exam Updated Vital Signs BP 126/84   Pulse 91   Temp 98.3 F (36.8 C)   Resp 20   Ht '5\' 7"'$  (1.702 m)   Wt 170 lb (77.1 kg)   SpO2 96%   BMI 26.63 kg/m   Physical Exam  Constitutional: He appears well-developed and well-nourished. No distress.  HENT:  Head: Normocephalic and atraumatic.  Mouth/Throat: Oropharynx is clear and moist. No oropharyngeal exudate.  Eyes: Conjunctivae and EOM are normal. Pupils are equal, round, and reactive to light. Right eye exhibits no discharge. Left eye exhibits no discharge. No scleral icterus.  Neck: Normal range of motion. Neck supple. No JVD present. No thyromegaly present.  Cardiovascular: Normal rate, regular rhythm, normal heart sounds and intact distal pulses.  Exam reveals no gallop and no friction rub.   No murmur heard. Pulmonary/Chest: Effort normal and breath sounds normal. No respiratory distress. He has no wheezes. He has no rales.  Abdominal: Soft. Bowel sounds are normal. He exhibits no distension and no mass. There is no tenderness.  Musculoskeletal: Normal range of motion. He exhibits no edema or  tenderness.  Lymphadenopathy:    He has no cervical adenopathy.  Neurological: He is alert. Coordination normal.  The patient is able to follow all commands with minimal difficulty though he has a significant amount of time to get the right. There is no lack of coordination by finger-nose-finger, he is able to speak with clear speech, he does have some difficulty recalling some items release it takes a small amount of extra time to get the right, he has normal strength all 4 extremities, no pronator drift, no cranial nerve abnormalities 3 through 12, pupillary exam is normal. Gross visual acuity is normal.  Skin: Skin is warm and dry. No rash noted. No erythema.  Psychiatric: He has a normal mood and affect. His behavior is normal.  Nursing note and vitals reviewed.    ED Treatments / Results  Labs (all labs ordered are listed, but only abnormal results are displayed) Labs Reviewed  COMPREHENSIVE METABOLIC PANEL - Abnormal; Notable for the following:       Result Value   Chloride 100 (*)    Glucose, Bld 260 (*)    All other components within normal limits  CBG MONITORING, ED - Abnormal; Notable for the following:    Glucose-Capillary 265 (*)    All other components within normal limits  I-STAT CHEM 8, ED - Abnormal; Notable for the following:    Chloride 99 (*)    Glucose, Bld 249 (*)    All other components within normal limits  ETHANOL  PROTIME-INR  APTT  CBC  DIFFERENTIAL  RAPID URINE DRUG SCREEN, HOSP PERFORMED  URINALYSIS, ROUTINE W REFLEX MICROSCOPIC  I-STAT TROPOININ, ED    EKG  EKG Interpretation  Date/Time:  Saturday February 09 2016 19:45:59 EST Ventricular Rate:  103 PR Interval:    QRS Duration: 110 QT Interval:  372 QTC Calculation: 487 R Axis:   -87 Text Interpretation:  Sinus tachycardia LAD, consider left anterior fascicular block RSR' in V1 or V2, right VCD or RVH Borderline prolonged QT interval Since last tracing rate faster Confirmed by Meng Winterton  MD,  Melena Hayes ([id number]) on 02/09/2016 9:15:19 PM       Radiology Ct Head Code Stroke W/o Cm  Result Date: 02/09/2016 CLINICAL DATA:  Code stroke. Sudden onset of headaches with confusion. EXAM: CT HEAD WITHOUT CONTRAST TECHNIQUE: Contiguous axial images were obtained from the base of the skull through the vertex without intravenous contrast. COMPARISON:  None. FINDINGS: Brain: No evidence of acute infarction, hemorrhage, hydrocephalus, extra-axial collection or mass lesion/mass effect. Normal cerebral volume. No white matter disease. Vascular: No hyperdense vessel or unexpected calcification. Skull: Normal. Negative for fracture or focal lesion. Sinuses/Orbits: No acute finding. Other: None. ASPECTS [facility] Stroke Program Early CT Score) - Ganglionic level infarction (caudate, lentiform nuclei, internal capsule, insula, M1-M3 cortex): 7 - Supraganglionic infarction (M4-M6 cortex): 3 Total score (0-10 with 10 being normal): 10 IMPRESSION: 1. Negative exam. 2. ASPECTS is 10. 3. These results were called by telephone at the time of interpretation on 02/09/2016 at 8:23 pm to Dr. Noemi Chapel , who verbally acknowledged these results. Electronically Signed   By: Staci Righter M.D.   On: 02/09/2016 20:25    Procedures Procedures (including critical care time)  Medications Ordered in ED Medications  prochlorperazine (COMPAZINE) injection 10 mg (10 mg Intravenous Given 02/09/16 2103)  diphenhydrAMINE (BENADRYL) injection 25 mg (25 mg Intravenous Given 02/09/16 2103)     Initial Impression / Assessment and Plan / ED Course  I have reviewed the triage vital signs and the nursing notes.  Pertinent labs & imaging results that were available during my care of the patient were reviewed by me and considered in my medical decision making (see chart for details).  Clinical Course     The patient presented as a code stroke, this was activated very soon after arrival. He had no focal abnormalities except for his  difficulty answering questions which seem to be quite slowed. Other than that he had normal neurologic findings. The CT scan was negative, his lab work was rather unremarkable except for some hyper per glycemia, the neurologist recommended that the patient be admitted for a TIA workup. I have recommended this as well, discussed this with the hospitalist, Dr. Lorin Mercy will kindly come and see  the patient.  Final Clinical Impressions(s) / ED Diagnoses   Final diagnoses:  None    New Prescriptions New Prescriptions   No medications on file     Noemi Chapel, MD 02/09/16 2131

## 2016-02-09 NOTE — Progress Notes (Signed)
We used beeper for time Beeper sounder 2006 Patient brought to CT by RN at 2008 Exam finished 2013 Images sent to [facility] 2013 [place] called 2014 Results called to Dr Sabra Heck 2023 Report signed off in [place].

## 2016-02-10 ENCOUNTER — Observation Stay (HOSPITAL_BASED_OUTPATIENT_CLINIC_OR_DEPARTMENT_OTHER): Payer: BLUE CROSS/BLUE SHIELD

## 2016-02-10 ENCOUNTER — Observation Stay (HOSPITAL_COMMUNITY): Payer: BLUE CROSS/BLUE SHIELD

## 2016-02-10 DIAGNOSIS — E785 Hyperlipidemia, unspecified: Secondary | ICD-10-CM | POA: Diagnosis not present

## 2016-02-10 DIAGNOSIS — Z9119 Patient's noncompliance with other medical treatment and regimen: Secondary | ICD-10-CM | POA: Diagnosis not present

## 2016-02-10 DIAGNOSIS — I63412 Cerebral infarction due to embolism of left middle cerebral artery: Secondary | ICD-10-CM | POA: Diagnosis not present

## 2016-02-10 DIAGNOSIS — G459 Transient cerebral ischemic attack, unspecified: Secondary | ICD-10-CM | POA: Diagnosis not present

## 2016-02-10 DIAGNOSIS — E1159 Type 2 diabetes mellitus with other circulatory complications: Secondary | ICD-10-CM

## 2016-02-10 DIAGNOSIS — I10 Essential (primary) hypertension: Secondary | ICD-10-CM | POA: Diagnosis not present

## 2016-02-10 DIAGNOSIS — F17200 Nicotine dependence, unspecified, uncomplicated: Secondary | ICD-10-CM | POA: Diagnosis not present

## 2016-02-10 LAB — GLUCOSE, CAPILLARY
GLUCOSE-CAPILLARY: 236 mg/dL — AB (ref 65–99)
GLUCOSE-CAPILLARY: 186 mg/dL — AB (ref 65–99)
GLUCOSE-CAPILLARY: 275 mg/dL — AB (ref 65–99)
Glucose-Capillary: 254 mg/dL — ABNORMAL HIGH (ref 65–99)

## 2016-02-10 LAB — LIPID PANEL
CHOL/HDL RATIO: 8.8 ratio
CHOLESTEROL: 212 mg/dL — AB (ref 0–200)
HDL: 24 mg/dL — ABNORMAL LOW (ref >40)
LDL Cholesterol: 119 mg/dL — ABNORMAL HIGH (ref 0–99)
Triglycerides: 347 mg/dL — ABNORMAL HIGH (ref <150)
VLDL: 69 mg/dL — AB (ref 0–40)

## 2016-02-10 LAB — ECHOCARDIOGRAM COMPLETE
HEIGHTINCHES: 68 in
Weight: 2440.93 oz

## 2016-02-10 LAB — SEDIMENTATION RATE: SED RATE: 6 mm/h (ref 0–16)

## 2016-02-10 LAB — TROPONIN I
Troponin I: <0.03 ng/mL (ref <0.03)
Troponin I: <0.03 ng/mL (ref <0.03)
Troponin I: <0.03 ng/mL (ref <0.03)

## 2016-02-10 MED ORDER — HYDROMORPHONE HCL 1 MG/ML IJ SOLN
1.0000 mg | INTRAMUSCULAR | Status: DC | PRN
  Administered 2016-02-10: 1 mg via INTRAVENOUS
  Filled 2016-02-10 (×3): qty 1

## 2016-02-10 MED ORDER — ATORVASTATIN CALCIUM 40 MG PO TABS
40.0000 mg | ORAL_TABLET | Freq: Every day | ORAL | Status: DC
  Administered 2016-02-10 – 2016-02-11 (×2): 40 mg via ORAL
  Filled 2016-02-10 (×2): qty 1

## 2016-02-10 MED ORDER — ONDANSETRON HCL 4 MG/2ML IJ SOLN
4.0000 mg | Freq: Four times a day (QID) | INTRAMUSCULAR | Status: DC | PRN
  Administered 2016-02-10: 4 mg via INTRAVENOUS
  Filled 2016-02-10: qty 2

## 2016-02-10 MED ORDER — ATORVASTATIN CALCIUM 10 MG PO TABS
20.0000 mg | ORAL_TABLET | Freq: Every day | ORAL | Status: DC
  Filled 2016-02-10: qty 2

## 2016-02-10 MED ORDER — GADOBENATE DIMEGLUMINE 529 MG/ML IV SOLN
20.0000 mL | Freq: Once | INTRAVENOUS | Status: AC | PRN
  Administered 2016-02-10: 20 mL via INTRAVENOUS

## 2016-02-10 NOTE — Consult Note (Signed)
Chief Complaint: Headache and word finding difficulties HPI: Nicholas [name] is an 56 y.o. male presented with headache with word finding difficulties started yesterday. He says he was in his usual state of health until 2 PM when he started to notice some headache which progressed until he came into the emergency room. There were some word finding difficulties therefore Corso was activated yesterday at [facility]. His neurological examination was unremarkable therefore was not a candidate for IV TPA or endovascular intervention. CT head was reportedly normal without any evidence of hemorrhage. He was transferred here due to inability of obtaining MRI as patient has some sort of wire in his chest since young age.  He reports he has headaches in the past but not as bad as this. He remembers having a headache maybe about a month ago but very mild does not require taking any medications. His headache is localized to frontal region bilaterally describing as a throbbing sensation. He reports his pain to be at a scale of 6-7/10. There is no exacerbating or relieving factors. Headache is constant since yesterday except this morning had a period of 10-15 minutes when he was headache free. He does not remember if he had received medication for pain which helped his headache. He had some photophobia yesterday but not anymore. There is no phonophobia. He reports having some nausea but no vomiting and currently there is no nausea as well. He denies any changes to his vision, weakness, problems swallowing, problems with speech or neck stiffness. His wife reports that he seemed a little bit confused yesterday when he had difficulty obtaining his blood sugar. She reports is not compliant with medications and he takes it only when he remembers it.   History of Stroke: No.  Date last known well:Date: 02/09/2016 Time last known well: Unable to determine  NIH stroke scale: 0  tPA Given: No: No focal deficits. Outside  of treatment window  Past Medical History:  Diagnosis Date  . Anal lesion    Anal papilla/tags - external lesion  . Colon polyp    Hyperplastic rectosigmoid polyp 9/08  . Coronary atherosclerosis of native coronary artery    DES to PLB, BMS to mid and distal RCA, DES to circumflex and distal LAD, 6/10 (5 stents)  . GERD (gastroesophageal reflux disease)   . Helicobacter pylori (H. pylori) 09/2009   Treated with helidac  . Hiatal hernia   . Hyperplastic colon polyp 2008  . Mixed hyperlipidemia   . Myocardial infarction 06/2008  . Type 2 diabetes mellitus ([place])     Past Surgical History:  Procedure Laterality Date  . CHOLECYSTECTOMY    . COLLAPSED LUNG     15 years ago  . COLONOSCOPY  10/12/2006   Dr. Gala Romney- rectosigmoid polyp s/phot snare polypectomy o/w normal rectum and terminal ileum. hyperplastic polyp on bx  . ESOPHAGOGASTRODUODENOSCOPY  10/12/2006   Dr. Gala Romney- examination of the tubular esophagus revealed no mucosal abnormalities. the EG junction was easily traversed. small hiatal hernia, the gastric mucosa o/w appeared normal. there was no infiltrating process or frank ulcer seen.  Geronimo Boot IN APEX OF RIGHT LUNG     Since childhood    Family History  Problem Relation Age of Onset  . Diabetes Mother   . Heart disease Father   . Cancer Brother   . CVA Neg Hx    Social History:  reports that he has been smoking Cigarettes.  He has a 102.00 pack-year smoking history. He has  never used smokeless tobacco. He reports that he does not drink alcohol or use drugs.  Allergies: No Known Allergies  Medications Prior to Admission  Medication Sig Dispense Refill  . acetaminophen (TYLENOL) 500 MG tablet Take 1,000 mg by mouth every 6 (six) hours as needed.    Marland Kitchen aspirin 325 MG EC tablet Take 325 mg by mouth daily.      . clopidogrel (PLAVIX) 75 MG tablet TAKE ONE TABLET BY MOUTH ONCE DAILY. 30 tablet 11  . glucose blood (BAYER CONTOUR TEST) test strip Use as instructed 4 x daily  E11.65. Contour plus test strips 150 each 5  . pantoprazole (PROTONIX) 40 MG tablet Take 1 tablet by mouth daily.     . traMADol (ULTRAM) 50 MG tablet Take 50 mg by mouth daily.     Marland Kitchen NITROSTAT 0.4 MG SL tablet DISSOLVE 1 TABLET UNDER TONGUE EVERY 5 MINUTES UP TO 15 MIN FOR CHESTPAIN. IF NO RELIEF CALL 911. 25 tablet 1    ROS: 10 point review of system was negative.  Physical Examination: Blood pressure 135/70, pulse 93, temperature 98.4 F (36.9 C), temperature source Oral, resp. rate 20, height 5' 8"  (1.727 m), weight 69.2 kg (152 lb 8.9 oz), SpO2 95 %.  HEENT-  Normocephalic, no lesions, Neck supple and no rigidity was appreciated. Cardiovascular - regular rate and rhythm, S1, S2 normal, no murmur, click, rub or gallop Lungs - chest clear, no wheezing, rales, normal symmetric air entry, Heart exam - S1, S2 normal, no murmur, no gallop, rate regular Abdomen - soft nontender and nondistended  Neurologic Examination: Mental status: Awake alert and oriented to all spheres. Speech and language: No evidence of dysarthria was appreciated. Combination intact. Naming intact. Fluent. Reading intact. Cranial nerves: Pupils approximately 3 mm and reactive to light. Fundus exam showed no evidence of papilledema bilaterally. Extra muscles intact. Facial sensation symmetric. No facial droop is appreciated. Hearing intact. Uvula midline. Tongue midline. Motor: 5/5 throughout Sensory: Normal sensation to light touch and temperature. Vibratory sensation is approximately 6 seconds at toes bilaterally. Coordination: Normal finger to nose and heel to show bilaterally no evidence of dysdiadochokinesia. Gait: Deferred  Results for orders placed or performed during the hospital encounter of 02/09/16 (from the past 48 hour(s))  CBG monitoring, ED     Status: Abnormal   Collection Time: 02/09/16  7:52 PM  Result Value Ref Range   Glucose-Capillary 265 (H) 65 - 99 mg/dL  Ethanol     Status: None    Collection Time: 02/09/16  7:59 PM  Result Value Ref Range   Alcohol, Ethyl (B) <5 <5 mg/dL    Comment:        LOWEST DETECTABLE LIMIT FOR SERUM ALCOHOL IS 5 mg/dL FOR MEDICAL PURPOSES ONLY   Protime-INR     Status: None   Collection Time: 02/09/16  7:59 PM  Result Value Ref Range   Prothrombin Time 12.7 11.4 - 15.2 seconds   INR 0.95   APTT     Status: None   Collection Time: 02/09/16  7:59 PM  Result Value Ref Range   aPTT 28 24 - 36 seconds  CBC     Status: None   Collection Time: 02/09/16  7:59 PM  Result Value Ref Range   WBC 9.8 4.0 - 10.5 K/uL   RBC 5.69 4.22 - 5.81 MIL/uL   Hemoglobin 15.9 13.0 - 17.0 g/dL   HCT 46.2 39.0 - 52.0 %   MCV 81.2 78.0 - 100.0 fL  MCH 27.9 26.0 - 34.0 pg   MCHC 34.4 30.0 - 36.0 g/dL   RDW 13.6 11.5 - 15.5 %   Platelets 155 150 - 400 K/uL  Differential     Status: None   Collection Time: 02/09/16  7:59 PM  Result Value Ref Range   Neutrophils Relative % 57 %   Neutro Abs 5.4 1.7 - 7.7 K/uL   Lymphocytes Relative 35 %   Lymphs Abs 3.5 0.7 - 4.0 K/uL   Monocytes Relative 7 %   Monocytes Absolute 0.7 0.1 - 1.0 K/uL   Eosinophils Relative 1 %   Eosinophils Absolute 0.1 0.0 - 0.7 K/uL   Basophils Relative 0 %   Basophils Absolute 0.0 0.0 - 0.1 K/uL  Comprehensive metabolic panel     Status: Abnormal   Collection Time: 02/09/16  7:59 PM  Result Value Ref Range   Sodium 137 135 - 145 mmol/L   Potassium 3.8 3.5 - 5.1 mmol/L   Chloride 100 (L) 101 - 111 mmol/L   CO2 27 22 - 32 mmol/L   Glucose, Bld 260 (H) 65 - 99 mg/dL   BUN 11 6 - 20 mg/dL   Creatinine, Ser 0.73 0.61 - 1.24 mg/dL   Calcium 9.6 8.9 - 10.3 mg/dL   Total Protein 7.1 6.5 - 8.1 g/dL   Albumin 4.1 3.5 - 5.0 g/dL   AST 16 15 - 41 U/L   ALT 21 17 - 63 U/L   Alkaline Phosphatase 75 38 - 126 U/L   Total Bilirubin 0.5 0.3 - 1.2 mg/dL   GFR calc non Af Amer >60 >60 mL/min   GFR calc Af Amer >60 >60 mL/min    Comment: (NOTE) The eGFR has been calculated using the CKD EPI  equation. This calculation has not been validated in all clinical situations. eGFR's persistently <60 mL/min signify possible Chronic Kidney Disease.    Anion gap 10 5 - 15  I-stat troponin, ED (not at [facility], [facility])     Status: None   Collection Time: 02/09/16  8:05 PM  Result Value Ref Range   Troponin i, poc 0.02 0.00 - 0.08 ng/mL   Comment 3            Comment: Due to the release kinetics of cTnI, a negative result within the first hours of the onset of symptoms does not rule out myocardial infarction with certainty. If myocardial infarction is still suspected, repeat the test at appropriate intervals.   I-Stat Chem 8, ED  (not at [facility], [facility])     Status: Abnormal   Collection Time: 02/09/16  8:06 PM  Result Value Ref Range   Sodium 137 135 - 145 mmol/L   Potassium 3.9 3.5 - 5.1 mmol/L   Chloride 99 (L) 101 - 111 mmol/L   BUN 10 6 - 20 mg/dL   Creatinine, Ser 0.70 0.61 - 1.24 mg/dL   Glucose, Bld 249 (H) 65 - 99 mg/dL   Calcium, Ion 1.16 1.15 - 1.40 mmol/L   TCO2 27 0 - 100 mmol/L   Hemoglobin 16.3 13.0 - 17.0 g/dL   HCT 48.0 39.0 - 52.0 %  Urine rapid drug screen (hosp performed)not at [facility]     Status: None   Collection Time: 02/09/16 10:30 PM  Result Value Ref Range   Opiates NONE DETECTED NONE DETECTED   Cocaine NONE DETECTED NONE DETECTED   Benzodiazepines NONE DETECTED NONE DETECTED   Amphetamines NONE DETECTED NONE DETECTED   Tetrahydrocannabinol NONE DETECTED NONE  DETECTED   Barbiturates NONE DETECTED NONE DETECTED    Comment:        DRUG SCREEN FOR MEDICAL PURPOSES ONLY.  IF CONFIRMATION IS NEEDED FOR ANY PURPOSE, NOTIFY LAB WITHIN 5 DAYS.        LOWEST DETECTABLE LIMITS FOR URINE DRUG SCREEN Drug Class       Cutoff (ng/mL) Amphetamine      1000 Barbiturate      200 Benzodiazepine   300 Tricyclics       762 Opiates          300 Cocaine          300 THC              50   Urinalysis, Routine w reflex microscopic     Status: Abnormal   Collection Time:  02/09/16 10:30 PM  Result Value Ref Range   Color, Urine YELLOW YELLOW   APPearance CLEAR CLEAR   Specific Gravity, Urine 1.016 1.005 - 1.030   pH 6.0 5.0 - 8.0   Glucose, UA >=500 (A) NEGATIVE mg/dL   Hgb urine dipstick NEGATIVE NEGATIVE   Bilirubin Urine NEGATIVE NEGATIVE   Ketones, ur 5 (A) NEGATIVE mg/dL   Protein, ur NEGATIVE NEGATIVE mg/dL   Nitrite NEGATIVE NEGATIVE   Leukocytes, UA NEGATIVE NEGATIVE   RBC / HPF 0-5 0 - 5 RBC/hpf   WBC, UA 0-5 0 - 5 WBC/hpf   Bacteria, UA NONE SEEN NONE SEEN  Glucose, capillary     Status: Abnormal   Collection Time: 02/09/16 11:30 PM  Result Value Ref Range   Glucose-Capillary 246 (H) 65 - 99 mg/dL   Comment 1 Notify RN    Comment 2 Document in Chart   Troponin I     Status: None   Collection Time: 02/10/16  1:32 AM  Result Value Ref Range   Troponin I <0.03 <0.03 ng/mL  Troponin I     Status: None   Collection Time: 02/10/16  5:54 AM  Result Value Ref Range   Troponin I <0.03 <0.03 ng/mL  Lipid panel     Status: Abnormal   Collection Time: 02/10/16  5:54 AM  Result Value Ref Range   Cholesterol 212 (H) 0 - 200 mg/dL   Triglycerides 347 (H) <150 mg/dL   HDL 24 (L) >40 mg/dL   Total CHOL/HDL Ratio 8.8 RATIO   VLDL 69 (H) 0 - 40 mg/dL   LDL Cholesterol 119 (H) 0 - 99 mg/dL    Comment:        Total Cholesterol/HDL:CHD Risk Coronary Heart Disease Risk Table                     Men   Women  1/2 Average Risk   3.4   3.3  Average Risk       5.0   4.4  2 X Average Risk   9.6   7.1  3 X Average Risk  23.4   11.0        Use the calculated Patient Ratio above and the CHD Risk Table to determine the patient's CHD Risk.        ATP III CLASSIFICATION (LDL):  <100     mg/dL   Optimal  100-129  mg/dL   Near or Above                    Optimal  130-159  mg/dL   Borderline  160-189  mg/dL  High  >190     mg/dL   Very High   Glucose, capillary     Status: Abnormal   Collection Time: 02/10/16  8:03 AM  Result Value Ref Range    Glucose-Capillary 236 (H) 65 - 99 mg/dL  Troponin I     Status: None   Collection Time: 02/10/16 11:23 AM  Result Value Ref Range   Troponin I <0.03 <0.03 ng/mL  Glucose, capillary     Status: Abnormal   Collection Time: 02/10/16 11:23 AM  Result Value Ref Range   Glucose-Capillary 254 (H) 65 - 99 mg/dL   Ct Head Code Stroke W/o Cm  Result Date: 02/09/2016 CLINICAL DATA:  Code stroke. Sudden onset of headaches with confusion. EXAM: CT HEAD WITHOUT CONTRAST TECHNIQUE: Contiguous axial images were obtained from the base of the skull through the vertex without intravenous contrast. COMPARISON:  None. FINDINGS: Brain: No evidence of acute infarction, hemorrhage, hydrocephalus, extra-axial collection or mass lesion/mass effect. Normal cerebral volume. No white matter disease. Vascular: No hyperdense vessel or unexpected calcification. Skull: Normal. Negative for fracture or focal lesion. Sinuses/Orbits: No acute finding. Other: None. ASPECTS [facility] Stroke Program Early CT Score) - Ganglionic level infarction (caudate, lentiform nuclei, internal capsule, insula, M1-M3 cortex): 7 - Supraganglionic infarction (M4-M6 cortex): 3 Total score (0-10 with 10 being normal): 10 IMPRESSION: 1. Negative exam. 2. ASPECTS is 10. 3. These results were called by telephone at the time of interpretation on 02/09/2016 at 8:23 pm to Dr. Noemi Chapel , who verbally acknowledged these results. Electronically Signed   By: Staci Righter M.D.   On: 02/09/2016 20:25   Assessment: 56 y.o. male presented with atypical headache with features consistent with possible migraine. Neurological examination and history is not consistent with subarachnoid hemorrhage or mass occupying lesions. MRI confirms left superior diffusion restriction. Location of the stroke is concerning for possible embolism versus small vessel atherosclerotic disease  Stroke Risk Factors - diabetes mellitus, hyperlipidemia, hypertension and smoking  Plan: 1. LDL  is elevated therefore will start him on Lipitor 40 mg daily. 2. MRA  of the brain without contrast 3. PT consult, OT consult, Speech consult 4. Echocardiogram 5. Prophylactic therapy-already on aspirin and Plavix 6. Risk factor modification 7. Telemetry monitoring  Stroke education, treatment, and current plan discussed with patient/significant other: Yes.    [place] Lapaglia 02/10/2016, 4:39 PM

## 2016-02-10 NOTE — Progress Notes (Signed)
PROGRESS NOTE    Nicholas [name]  PXT:[id number] DOB: 04/01/1960 DOA: 02/09/2016 PCP: Purvis Kilts, MD    Brief Narrative:  56 y/o with HTN, DM, and tobacco use, presents to hospital with headache, difficulty with speech and performing basic tasks (recognizing family members, checking blood sugar). He was admitted to the hospital for TIA work up. Due to lack of neuro coverage and MRI services at [facility], patient will be transferred to [facility] to complete work up.   Assessment & Plan:   Principal Problem:   TIA (transient ischemic attack) Active Problems:   TOBACCO ABUSE   Type 2 diabetes mellitus with vascular disease (HCC)   Essential hypertension, benign   Hyperlipidemia   Right carotid bruit   Non compliance with medical treatment   1. Possible TIA. Patient admitted with transient episode of headache, difficulty finding words and completing tasks. He was unable to use his glucometer and was having difficulty recognizing family members. CT head done in the ED was negative for acute findings. He was evaluated by tele neurology in ED and was not felt to be a candidate for tPA.  Recommendations were for admission for TIA work up. Today I was informed by radiology, that since patient has a wire in his chest (since childhood), he will not be able to undergo MRI at [facility]. Fortunately, he can have MRI done at [facility] with [place]. Transferring patient to [facility] would be beneficial to get MRI done as well as neurology consultation (since neurology is not available at [facility] today). I have discussed case with Dr. Alver Fisher who has agreed to see the patient in consultation. Other work up including echo and carotid dopplers have been ordered. He is already on aspirin and plavix for history of CAD.  2. Diabetes. Previously on tresiba and novolog, but discontinued these medications approximately one month ago due to loss of insurance and financial constraints. Repeat A1C is pending. Continue on  SSI. Will start ow dose lantus.  3. HTN. Previously on beta blocker, but has been off for a few months now, since he did not like the way he felt while taking it. Will continue to monitor blood pressures. If BP starts to trend up, may need to start on lisinopril  4. HLD.  LDL is elevated. Previously on lipitor. Restart on statin  5. Tobacco dependence. Counseled on the importance of cessation  6. Non compliance. Patient and family report that this was mostly driven by financial constraints after losing insurance. He is now back on a new insurance and is willing to take medications after discharge.   DVT prophylaxis: lovenox Code Status: full code Family Communication: discussed with patient and wife at the bedside Disposition Plan: Transfer to [facility] for further evaluation   Consultants:     Procedures:   Echo: results pending  Antimicrobials:      Subjective: Feeling better. Headache is better. No blurry vision. Per family, speech is improving.  Objective: Vitals:   02/10/16 0310 02/10/16 0510 02/10/16 0710 02/10/16 0903  BP: 120/76 124/75 (!) 99/58 102/61  Pulse: 93 92 88 89  Resp: '20 20 18 16  '$ Temp: 98.6 F (37 C) 98.1 F (36.7 C) 98.4 F (36.9 C) 98 F (36.7 C)  TempSrc: Oral Oral  Oral  SpO2: 96% 96% 100% 95%  Weight:      Height:        Intake/Output Summary (Last 24 hours) at 02/10/16 1038 Last data filed at 02/10/16 6270  Gross  per 24 hour  Intake              545 ml  Output              402 ml  Net              143 ml   Filed Weights   02/09/16 1948 02/09/16 2311  Weight: 77.1 kg (170 lb) 69.2 kg (152 lb 8.9 oz)    Examination:  General exam: Appears calm and comfortable  Respiratory system: Clear to auscultation. Respiratory effort normal. Cardiovascular system: S1 & S2 heard, RRR. No JVD, murmurs, rubs, gallops or clicks. No pedal edema. Gastrointestinal system: Abdomen is nondistended, soft and nontender. No organomegaly or masses felt.  Normal bowel sounds heard. Central nervous system: Alert and oriented. No focal neurological deficits. Extremities: Symmetric 5 x 5 power. Skin: No rashes, lesions or ulcers Psychiatry: Judgement and insight appear normal. Mood & affect appropriate.     Data Reviewed: I have personally reviewed following labs and imaging studies  CBC:  Recent Labs Lab 02/09/16 1959 02/09/16 2006  WBC 9.8  --   NEUTROABS 5.4  --   HGB 15.9 16.3  HCT 46.2 48.0  MCV 81.2  --   PLT 155  --    Basic Metabolic Panel:  Recent Labs Lab 02/09/16 1959 02/09/16 2006  NA 137 137  K 3.8 3.9  CL 100* 99*  CO2 27  --   GLUCOSE 260* 249*  BUN 11 10  CREATININE 0.73 0.70  CALCIUM 9.6  --    GFR: Estimated Creatinine Clearance: 99.8 mL/min (by C-G formula based on SCr of 0.7 mg/dL). Liver Function Tests:  Recent Labs Lab 02/09/16 1959  AST 16  ALT 21  ALKPHOS 75  BILITOT 0.5  PROT 7.1  ALBUMIN 4.1   No results for input(s): LIPASE, AMYLASE in the last 168 hours. No results for input(s): AMMONIA in the last 168 hours. Coagulation Profile:  Recent Labs Lab 02/09/16 1959  INR 0.95   Cardiac Enzymes:  Recent Labs Lab 02/10/16 0132 02/10/16 0554  TROPONINI <0.03 <0.03   BNP (last 3 results) No results for input(s): PROBNP in the last 8760 hours. HbA1C: No results for input(s): HGBA1C in the last 72 hours. CBG:  Recent Labs Lab 02/09/16 1952 02/09/16 2330 02/10/16 0803  GLUCAP 265* 246* 236*   Lipid Profile:  Recent Labs  02/10/16 0554  CHOL 212*  HDL 24*  LDLCALC 119*  TRIG 347*  CHOLHDL 8.8   Thyroid Function Tests: No results for input(s): TSH, T4TOTAL, FREET4, T3FREE, THYROIDAB in the last 72 hours. Anemia Panel: No results for input(s): VITAMINB12, FOLATE, FERRITIN, TIBC, IRON, RETICCTPCT in the last 72 hours. Sepsis Labs: No results for input(s): PROCALCITON, LATICACIDVEN in the last 168 hours.  No results found for this or any previous visit (from the  past 240 hour(s)).       Radiology Studies: Ct Head Code Stroke W/o Cm  Result Date: 02/09/2016 CLINICAL DATA:  Code stroke. Sudden onset of headaches with confusion. EXAM: CT HEAD WITHOUT CONTRAST TECHNIQUE: Contiguous axial images were obtained from the base of the skull through the vertex without intravenous contrast. COMPARISON:  None. FINDINGS: Brain: No evidence of acute infarction, hemorrhage, hydrocephalus, extra-axial collection or mass lesion/mass effect. Normal cerebral volume. No white matter disease. Vascular: No hyperdense vessel or unexpected calcification. Skull: Normal. Negative for fracture or focal lesion. Sinuses/Orbits: No acute finding. Other: None. ASPECTS [facility] Stroke Program Early CT  Score) - Ganglionic level infarction (caudate, lentiform nuclei, internal capsule, insula, M1-M3 cortex): 7 - Supraganglionic infarction (M4-M6 cortex): 3 Total score (0-10 with 10 being normal): 10 IMPRESSION: 1. Negative exam. 2. ASPECTS is 10. 3. These results were called by telephone at the time of interpretation on 02/09/2016 at 8:23 pm to Dr. Noemi Chapel , who verbally acknowledged these results. Electronically Signed   By: Staci Righter M.D.   On: 02/09/2016 20:25        Scheduled Meds: . aspirin  325 mg Oral Daily  . clopidogrel  75 mg Oral Daily  . enoxaparin (LOVENOX) injection  40 mg Subcutaneous Q24H  . insulin aspart  0-15 Units Subcutaneous TID WC  . insulin aspart  0-5 Units Subcutaneous QHS  . nicotine  21 mg Transdermal Daily  . pantoprazole  40 mg Oral Daily  . traMADol  50 mg Oral Daily   Continuous Infusions: . sodium chloride 50 mL/hr at 02/10/16 0031     LOS: 0 days    Time spent: 83mns    Minka Knight, MD [facility] Pager [phone] If 7PM-7AM, please contact night-coverage [email or web address] Password [email or web address]1/14/2018, 10:38 AM

## 2016-02-10 NOTE — Progress Notes (Signed)
Patient transferring to [place].  Patient and family aware and agreeable.  Report called and given to [facility] on receiving unit.  [facility] In transport.

## 2016-02-10 NOTE — Progress Notes (Addendum)
Patient arrived from [facility], A&O X4, reports having a headaches--states that it is the worst pain that he has ever had, states that pain medicine has not made it better.Patient states that pain is a 6 on a 0-10 scale. MD notified-Will continue to monitor  Neuro MD paged at request of Attending MD.

## 2016-02-10 NOTE — Evaluation (Signed)
Physical Therapy Evaluation Patient Details Name: Nicholas [name] MRN: [id number] DOB: [birth year] Today's Date: 02/10/2016   History of Present Illness  56 y/o with HTN, DM, and tobacco use, presents to hospital with headache, difficulty with speech and performing basic tasks (recognizing family members, checking blood sugar). He was admitted to the hospital for TIA work up. Due to lack of neuro coverage and MRI services at [facility], patient will be transferred to [facility] to complete work up.    Clinical Impression  Pt received in bed with multiple family members in the room (6) with wife sitting in the bed with pt, and pt is agreeable to PT evaluation.  Pt is complaining of nausea, and RN notified.  Pt expressed that he is normally independent with community ambulation, ADL's, IADL's, and driving.  During today's evaluation, he demonstrates mod (I) with bed mobility, and sit<>stand transfer.  He ambulated 89f with no DME with supervision due to some cognitive deficits noted during conversation and while obtaining history.  Pt did not demonstrate any LOB during gait training an further distance deferred by patient due to intense HA.  During gait he does demonstrate slowed processing, but is able to eventually get the correct words and thought out.  He does not demonstrate need for skilled PT at this time, however, he may benefit from cognitive evaluation from SLP.      Follow Up Recommendations No PT follow up    Equipment Recommendations  None recommended by PT    Recommendations for Other Services       Precautions / Restrictions Precautions Precautions: Fall Precaution Comments: Fall risk due to confusion.  No falls in the past 6 months.  Restrictions Weight Bearing Restrictions: No      Mobility  Bed Mobility Overal bed mobility: Modified Independent             General bed mobility comments: HOB raised  Transfers Overall transfer level: Modified independent Equipment used: None                 Ambulation/Gait Ambulation/Gait assistance: Supervision;Min guard Ambulation Distance (Feet): 120 Feet Assistive device: None Gait Pattern/deviations: WFL(Within Functional Limits)        Stairs            Wheelchair Mobility    Modified Rankin (Stroke Patients Only)       Balance Overall balance assessment: No apparent balance deficits (not formally assessed)                                           Pertinent Vitals/Pain Pain Assessment: 0-10 Pain Score: 8  Pain Location: frontal HA Pain Descriptors / Indicators: Throbbing Pain Intervention(s): Limited activity within patient's tolerance;Monitored during session;Repositioned    Home Living   Living Arrangements: Spouse/significant other Available Help at Discharge: Available PRN/intermittently Type of Home: House Home Access: Stairs to enter   [employer] Steps: 5 with HR on one from the backyard.  Home Layout: One level Home Equipment: None      Prior Function     Gait / Transfers Assistance Needed: independent.   ADL's / Homemaking Assistance Needed: independent with dressing, bathing, driving.         Hand Dominance   Dominant Hand: Right    Extremity/Trunk Assessment   Upper Extremity Assessment Upper Extremity Assessment: Overall WFL for tasks assessed    Lower  Extremity Assessment Lower Extremity Assessment: Overall WFL for tasks assessed       Communication      Cognition Arousal/Alertness: Awake/alert Behavior During Therapy: WFL for tasks assessed/performed Overall Cognitive Status: Within Functional Limits for tasks assessed                 General Comments: Slight difficulty with the date.  Initially states it is September, but quickly changes his answer to January.     General Comments      Exercises     Assessment/Plan    PT Assessment Patent does not need any further PT services  PT Problem List             PT Treatment Interventions      PT Goals (Current goals can be found in the Care Plan section)  Acute Rehab PT Goals PT Goal Formulation: All assessment and education complete, DC therapy    Frequency     Barriers to discharge        Co-evaluation               End of Session Equipment Utilized During Treatment: Gait belt Activity Tolerance: Patient tolerated treatment well Patient left: in bed;with call bell/phone within reach;with family/visitor present;with nursing/sitter in room Nurse Communication: Mobility status (Megan/Allison, RN's notified of pt's mobility status, and mobility sheet left hanging in the room.  )         Time: 1624-4695 PT Time Calculation (min) (ACUTE ONLY): 18 min   Charges:   PT Evaluation $PT Eval Low Complexity: 1 Procedure     PT G Codes:        Beth Emani Taussig, PT, DPT X: [phone]

## 2016-02-10 NOTE — Progress Notes (Signed)
  Echocardiogram 2D Echocardiogram has been performed.  Jennette Dubin 02/10/2016, 9:31 AM

## 2016-02-11 ENCOUNTER — Inpatient Hospital Stay (HOSPITAL_COMMUNITY): Payer: BLUE CROSS/BLUE SHIELD

## 2016-02-11 DIAGNOSIS — I252 Old myocardial infarction: Secondary | ICD-10-CM | POA: Diagnosis not present

## 2016-02-11 DIAGNOSIS — Z91120 Patient's intentional underdosing of medication regimen due to financial hardship: Secondary | ICD-10-CM | POA: Diagnosis not present

## 2016-02-11 DIAGNOSIS — Z955 Presence of coronary angioplasty implant and graft: Secondary | ICD-10-CM | POA: Diagnosis not present

## 2016-02-11 DIAGNOSIS — G459 Transient cerebral ischemic attack, unspecified: Secondary | ICD-10-CM

## 2016-02-11 DIAGNOSIS — K219 Gastro-esophageal reflux disease without esophagitis: Secondary | ICD-10-CM | POA: Diagnosis present

## 2016-02-11 DIAGNOSIS — E785 Hyperlipidemia, unspecified: Secondary | ICD-10-CM

## 2016-02-11 DIAGNOSIS — I639 Cerebral infarction, unspecified: Secondary | ICD-10-CM | POA: Diagnosis not present

## 2016-02-11 DIAGNOSIS — E440 Moderate protein-calorie malnutrition: Secondary | ICD-10-CM | POA: Insufficient documentation

## 2016-02-11 DIAGNOSIS — Z9119 Patient's noncompliance with other medical treatment and regimen: Secondary | ICD-10-CM | POA: Diagnosis not present

## 2016-02-11 DIAGNOSIS — R4789 Other speech disturbances: Secondary | ICD-10-CM | POA: Diagnosis present

## 2016-02-11 DIAGNOSIS — I10 Essential (primary) hypertension: Secondary | ICD-10-CM | POA: Diagnosis present

## 2016-02-11 DIAGNOSIS — R0989 Other specified symptoms and signs involving the circulatory and respiratory systems: Secondary | ICD-10-CM | POA: Diagnosis present

## 2016-02-11 DIAGNOSIS — E1165 Type 2 diabetes mellitus with hyperglycemia: Secondary | ICD-10-CM | POA: Diagnosis present

## 2016-02-11 DIAGNOSIS — Z7902 Long term (current) use of antithrombotics/antiplatelets: Secondary | ICD-10-CM | POA: Diagnosis not present

## 2016-02-11 DIAGNOSIS — I63412 Cerebral infarction due to embolism of left middle cerebral artery: Secondary | ICD-10-CM | POA: Diagnosis not present

## 2016-02-11 DIAGNOSIS — Z7982 Long term (current) use of aspirin: Secondary | ICD-10-CM | POA: Diagnosis not present

## 2016-02-11 DIAGNOSIS — T383X6A Underdosing of insulin and oral hypoglycemic [antidiabetic] drugs, initial encounter: Secondary | ICD-10-CM | POA: Diagnosis present

## 2016-02-11 DIAGNOSIS — G458 Other transient cerebral ischemic attacks and related syndromes: Secondary | ICD-10-CM | POA: Diagnosis not present

## 2016-02-11 DIAGNOSIS — E782 Mixed hyperlipidemia: Secondary | ICD-10-CM | POA: Diagnosis present

## 2016-02-11 DIAGNOSIS — R4189 Other symptoms and signs involving cognitive functions and awareness: Secondary | ICD-10-CM | POA: Diagnosis present

## 2016-02-11 DIAGNOSIS — Y92009 Unspecified place in unspecified non-institutional (private) residence as the place of occurrence of the external cause: Secondary | ICD-10-CM | POA: Diagnosis not present

## 2016-02-11 DIAGNOSIS — F17210 Nicotine dependence, cigarettes, uncomplicated: Secondary | ICD-10-CM | POA: Diagnosis present

## 2016-02-11 DIAGNOSIS — Z8673 Personal history of transient ischemic attack (TIA), and cerebral infarction without residual deficits: Secondary | ICD-10-CM | POA: Diagnosis present

## 2016-02-11 DIAGNOSIS — I2510 Atherosclerotic heart disease of native coronary artery without angina pectoris: Secondary | ICD-10-CM | POA: Diagnosis present

## 2016-02-11 DIAGNOSIS — I63432 Cerebral infarction due to embolism of left posterior cerebral artery: Secondary | ICD-10-CM | POA: Diagnosis present

## 2016-02-11 LAB — GLUCOSE, CAPILLARY
GLUCOSE-CAPILLARY: 220 mg/dL — AB (ref 65–99)
Glucose-Capillary: 234 mg/dL — ABNORMAL HIGH (ref 65–99)
Glucose-Capillary: 230 mg/dL — ABNORMAL HIGH (ref 65–99)
Glucose-Capillary: 243 mg/dL — ABNORMAL HIGH (ref 65–99)

## 2016-02-11 LAB — TSH: TSH: 0.904 u[IU]/mL (ref 0.350–4.500)

## 2016-02-11 LAB — SEDIMENTATION RATE: Sed Rate: 9 mm/hr (ref 0–16)

## 2016-02-11 LAB — HEMOGLOBIN A1C
Hgb A1c MFr Bld: 11.3 % — ABNORMAL HIGH (ref 4.8–5.6)
Mean Plasma Glucose: 278 mg/dL

## 2016-02-11 LAB — RPR: RPR Ser Ql: NONREACTIVE

## 2016-02-11 LAB — C-REACTIVE PROTEIN: CRP: 2.1 mg/dL — ABNORMAL HIGH (ref <1.0)

## 2016-02-11 LAB — HIV ANTIBODY (ROUTINE TESTING W REFLEX): HIV SCREEN 4TH GENERATION: NONREACTIVE

## 2016-02-11 LAB — VITAMIN B12: VITAMIN B 12: 404 pg/mL (ref 180–914)

## 2016-02-11 MED ORDER — INSULIN GLARGINE 100 UNIT/ML ~~LOC~~ SOLN: 20.0000 [IU] | Freq: Every day | SUBCUTANEOUS | Status: DC

## 2016-02-11 MED ORDER — INSULIN GLARGINE 100 UNIT/ML ~~LOC~~ SOLN
20.0000 [IU] | Freq: Every day | SUBCUTANEOUS | Status: DC
  Filled 2016-02-11: qty 0.2

## 2016-02-11 MED ORDER — GLUCERNA SHAKE PO LIQD
237.0000 mL | Freq: Two times a day (BID) | ORAL | Status: DC
  Administered 2016-02-11: 237 mL via ORAL
  Filled 2016-02-11 (×5): qty 237

## 2016-02-11 MED ORDER — ADULT MULTIVITAMIN W/MINERALS CH
1.0000 | ORAL_TABLET | Freq: Every day | ORAL | Status: DC
  Administered 2016-02-11 – 2016-02-12 (×2): 1 via ORAL
  Filled 2016-02-11 (×2): qty 1

## 2016-02-11 MED ORDER — TRAMADOL HCL 50 MG PO TABS
50.0000 mg | ORAL_TABLET | Freq: Four times a day (QID) | ORAL | Status: DC | PRN
  Administered 2016-02-11 (×3): 50 mg via ORAL
  Filled 2016-02-11 (×3): qty 1

## 2016-02-11 MED ORDER — INSULIN GLARGINE 100 UNIT/ML ~~LOC~~ SOLN
10.0000 [IU] | Freq: Once | SUBCUTANEOUS | Status: AC
  Administered 2016-02-11: 10 [IU] via SUBCUTANEOUS
  Filled 2016-02-11: qty 0.1

## 2016-02-11 NOTE — Progress Notes (Signed)
Inpatient Diabetes Program Recommendations  AACE/ADA: New Consensus Statement on Inpatient Glycemic Control (2015)  Target Ranges:  Prepandial:   less than 140 mg/dL      Peak postprandial:   less than 180 mg/dL (1-2 hours)      Critically ill patients:  140 - 180 mg/dL   Lab Results  Component Value Date   GLUCAP 230 (H) 02/11/2016   HGBA1C 11.3 (H) 02/10/2016    Review of Glycemic Control  Diabetes history: DM2 Outpatient Diabetes medications: Triseba 50 units QHS, Novolog 10 units tidwc (has not taken in several months d/t losing insurance. Current orders for Inpatient glycemic control: Novolog 0-15 units tidwc and hs  Pt states he lost his insurance several months ago and could not afford his insulin, so stopped taking it. Did not contact Dr. Dorris Fetch to let him know. Pt states he does have insurance now on his wife's work Actuary. States he shouldn't have trouble getting his insulin. Stressed importance of f/u with Dr. Dorris Fetch within a week of discharge.   Inpatient Diabetes Program Recommendations:    Add Lantus 20 units QHS Add Novolog 5 units tidwc for meal coverage insulin  I asked pt's wife to check insurance to see if his insulin is covered. May need 70/30 insulin ($24.99 at [facility] in the interim. Discussed importance of controlling blood sugars to prevent long-term complications. Stressed importance of monitoring blood sugars and taking logbook to MD visit. Pt voices understanding.  Thank you. Lorenda Peck, RD, LDN, CDE Inpatient Diabetes Coordinator [phone]

## 2016-02-11 NOTE — Evaluation (Signed)
Occupational Therapy Evaluation Patient Details Name: Nicholas [name] MRN: [id number] DOB: 1960-06-23 Today's Date: 02/11/2016    History of Present Illness 56 y/o with HTN, DM, and tobacco use, presents to hospital with headache, difficulty with speech and performing basic tasks (recognizing family members, checking blood sugar). He was admitted to the hospital for TIA work up. Due to lack of neuro coverage and MRI services at [facility], patient will be transferred to [facility] to complete work up.   Clinical Impression   PTA, pt was independent with ADL and functional mobility and was working in [employer] as a Freight forwarder. Pt currently requires supervision for all ADL and demonstrates modified independence with general transfers. Pt has good family support and his wife will be able to provide intermittent supervision post-acute D/C. No cognitive or visual deficits noted during functional activities. Pt and wife report that he has returned to baseline. Completed education concerning fall prevention and safety post-acute D/C. No further acute OT needs identified at this time and no OT follow-up needed. Acute OT will sign off.    Follow Up Recommendations  No OT follow up;Supervision - Intermittent    Equipment Recommendations       Recommendations for Other Services       Precautions / Restrictions Precautions Precautions: Fall Restrictions Weight Bearing Restrictions: No      Mobility Bed Mobility Overal bed mobility: Modified Independent             General bed mobility comments: HOB raised  Transfers Overall transfer level: Modified independent Equipment used: None                  Balance Overall balance assessment: No apparent balance deficits (not formally assessed)                                          ADL Overall ADL's : Needs assistance/impaired Eating/Feeding: Set up;Sitting   Grooming: Supervision/safety;Standing;Wash/dry hands   Upper Body  Bathing: Set up;Sitting   Lower Body Bathing: Supervison/ safety;Sit to/from stand   Upper Body Dressing : Set up;Sitting   Lower Body Dressing: Supervision/safety;Sit to/from stand   Toilet Transfer: Supervision/safety;Ambulation;BSC   Toileting- Water quality scientist and Hygiene: Supervision/safety;Sit to/from stand   Tub/ Shower Transfer: Supervision/safety;Walk-in shower;Ambulation   Functional mobility during ADLs: Supervision/safety General ADL Comments: Pt and family educated on fall prevention and ADL strategies. They have lots of questions concerning CVA as well as recovery.     Vision Vision Assessment?: No apparent visual deficits   Perception     Praxis Praxis Praxis tested?: Within functional limits    Pertinent Vitals/Pain Pain Assessment: No/denies pain     Hand Dominance Right   Extremity/Trunk Assessment Upper Extremity Assessment Upper Extremity Assessment: Overall WFL for tasks assessed   Lower Extremity Assessment Lower Extremity Assessment: Overall WFL for tasks assessed       Communication Communication Communication: No difficulties   Cognition Arousal/Alertness: Awake/alert Behavior During Therapy: WFL for tasks assessed/performed Overall Cognitive Status: Within Functional Limits for tasks assessed                 General Comments: Only slight difficulty with date stating it was either the 14th or 15th of January.   General Comments       Exercises       Shoulder Instructions      Home Living Family/patient expects to be  discharged to:: Private residence Living Arrangements: Spouse/significant other Available Help at Discharge: Available PRN/intermittently Type of Home: House Home Access: Stairs to enter [employer] of Steps: 5 with HR on one from the backyard.    Home Layout: One level     Bathroom Shower/Tub: Tub/shower unit;Walk-in shower (mostly uses jacuzzi tub) Shower/tub characteristics:  Scientist, water quality: None          Prior Functioning/Environment Level of Independence: Animal nutritionist / Transfers Assistance Needed: independent.  ADL's / Homemaking Assistance Needed: independent with dressing, bathing, driving.             OT Problem List:     OT Treatment/Interventions:      OT Goals(Current goals can be found in the care plan section) Acute Rehab OT Goals Patient Stated Goal: to go home  OT Goal Formulation: With patient Time For Goal Achievement: 02/18/16 Potential to Achieve Goals: Good  OT Frequency:     Barriers to D/C:            Co-evaluation              End of Session Nurse Communication: Mobility status  Activity Tolerance: Patient tolerated treatment well Patient left: in chair;with family/visitor present   Time: 1093-2355 OT Time Calculation (min): 25 min Charges:  OT General Charges $OT Visit: 1 Procedure OT Evaluation $OT Eval Moderate Complexity: 1 Procedure OT Treatments $Self Care/Home Management : 8-22 mins G-Codes: OT G-codes **NOT FOR INPATIENT CLASS** Functional Assessment Tool Used: clinical judgement Functional Limitation: Self care Self Care Current Status (D3220): At least 1 percent but less than 20 percent impaired, limited or restricted Self Care Goal Status (U5427): At least 1 percent but less than 20 percent impaired, limited or restricted Self Care Discharge Status [phone]): At least 1 percent but less than 20 percent impaired, limited or restricted  Norman Herrlich, OTR/L [phone] 02/11/2016, 12:41 PM

## 2016-02-11 NOTE — Progress Notes (Signed)
STROKE TEAM PROGRESS NOTE   HISTORY OF PRESENT ILLNESS (per record) Nicholas [name] is an 56 y.o. male presented with headache with word finding difficulties started yesterday. He says he was in his usual state of health until 2 PM 02/09/2016 ([facility]) when he started to notice some headache which progressed until he came into the emergency room. There were some word finding difficulties therefore Code Stroke was activated at [facility]. His neurological examination was unremarkable therefore was not a candidate for IV TPA or endovascular intervention. CT head was reportedly normal without any evidence of hemorrhage. He was transferred to [name] due to inability of obtaining MRI as patient has some sort of wire in his chest since young age.  He reports he has headaches in the past but not as bad as this. He remembers having a headache maybe about a month ago but very mild does not require taking any medications. His headache is localized to frontal region bilaterally describing as a throbbing sensation. He reports his pain to be at a scale of 6-7/10. There is no exacerbating or relieving factors. Headache is constant since yesterday except this morning had a period of 10-15 minutes when he was headache free. He does not remember if he had received medication for pain which helped his headache. He had some photophobia yesterday but not anymore. There is no phonophobia. He reports having some nausea but no vomiting and currently there is no nausea as well. He denies any changes to his vision, weakness, problems swallowing, problems with speech or neck stiffness. His wife reports that he seemed a little bit confused yesterday when he had difficulty obtaining his blood sugar. She reports is not compliant with medications and he takes it only when he remembers it. NIH stroke scale: 0 He was admitted for further evaluation and treatment.   SUBJECTIVE (INTERVAL HISTORY) Wife is at bedside. Pt language back to  normal as per himself. Still has mild HA at middle frontal, 2/10. Yesterday he had bad headache 10/8, throbbing, no visual changes. Denies any hx of migraine or heart palpitation. Had PMH of MI s/p stent, still smoking, DM not in good control, glucose at 200s at home. Works as Occupational hygienist.    OBJECTIVE Temp:  [97.9 F (36.6 C)-98.4 F (36.9 C)] 98.3 F (36.8 C) (01/15 1026) Pulse Rate:  [77-101] 82 (01/15 1026) Cardiac Rhythm: Normal sinus rhythm (01/15 0700) Resp:  [16-20] 16 (01/15 1026) BP: (97-145)/(61-89) 97/61 (01/15 1026) SpO2:  [93 %-97 %] 96 % (01/15 1026)  CBC:  Recent Labs Lab 02/09/16 1959 02/09/16 2006  WBC 9.8  --   NEUTROABS 5.4  --   HGB 15.9 16.3  HCT 46.2 48.0  MCV 81.2  --   PLT 155  --     Basic Metabolic Panel:  Recent Labs Lab 02/09/16 1959 02/09/16 2006  NA 137 137  K 3.8 3.9  CL 100* 99*  CO2 27  --   GLUCOSE 260* 249*  BUN 11 10  CREATININE 0.73 0.70  CALCIUM 9.6  --     Lipid Panel:    Component Value Date/Time   CHOL 212 (H) 02/10/2016 0554   TRIG 347 (H) 02/10/2016 0554   HDL 24 (L) 02/10/2016 0554   CHOLHDL 8.8 02/10/2016 0554   VLDL 69 (H) 02/10/2016 0554   LDLCALC 119 (H) 02/10/2016 0554   HgbA1c:  Lab Results  Component Value Date   HGBA1C 11.3 (H) 02/10/2016   Urine Drug Screen:  Component Value Date/Time   LABOPIA NONE DETECTED 02/09/2016 2230   COCAINSCRNUR NONE DETECTED 02/09/2016 2230   LABBENZ NONE DETECTED 02/09/2016 2230   AMPHETMU NONE DETECTED 02/09/2016 2230   THCU NONE DETECTED 02/09/2016 2230   LABBARB NONE DETECTED 02/09/2016 2230    IMAGING I have personally reviewed the radiological images below and agree with the radiology interpretations.  Ct Head Code Stroke W/o Cm 02/09/2016 1. Negative exam. 2. ASPECTS is 10  [name] Wo Contrast [name] Contrast [name] Neck W Wo Contrast 02/10/2016 Acute nonhemorrhagic LEFT occipital cortical and subcortical infarction. It is unclear if  this represents a watershed infarct or distal embolic stroke from a LEFT MCA or LEFT PCA branch. MRA of the intracranial circulation is unremarkable. MRA of the extracranial circulation is concerning for a flow reducing lesion at the LEFT vertebral origin, suspected 75% or greater ostial stenosis.  TCD bubble study pending  LE venous doppler pending  TTE Left ventricle: The cavity size was normal. There was mild   concentric hypertrophy. Systolic function was normal. The   estimated ejection fraction was in the range of 55% to 60%. Wall   motion was normal; there were no regional wall motion   abnormalities. Doppler parameters are consistent with abnormal   left ventricular relaxation (grade 1 diastolic dysfunction).   There was no evidence of elevated ventricular filling pressure by   Doppler parameters. - Aortic valve: Trileaflet; mildly thickened, mildly calcified   leaflets. Transvalvular velocity was within the normal range.   There was no stenosis. There was no regurgitation. - Aortic root: The aortic root was normal in size. - Mitral valve: Structurally normal valve. There was no   regurgitation. - Left atrium: The atrium was normal in size. - Right ventricle: Systolic function was normal. - Right atrium: The atrium was normal in size. - Tricuspid valve: There was trivial regurgitation. - Pulmonary arteries: Systolic pressure was within the normal   range. - Inferior vena cava: The vessel was normal in size. - Pericardium, extracardiac: There was no pericardial effusion.  TEE pending  Hypercoagulable pending   PHYSICAL EXAM  Temp:  [97.9 F (36.6 C)-98.4 F (36.9 C)] 98.3 F (36.8 C) (01/15 1026) Pulse Rate:  [77-101] 82 (01/15 1026) Resp:  [16-20] 16 (01/15 1026) BP: (97-135)/(61-71) 97/61 (01/15 1026) SpO2:  [93 %-96 %] 96 % (01/15 1026)  General - Well nourished, well developed, in no apparent distress.  Ophthalmologic - Fundi not visualized due to eye  movement.  Cardiovascular - Regular rate and rhythm.  Mental Status -  Level of arousal and orientation to time, place, and person were intact. Language including expression, naming, repetition, comprehension was assessed and found intact. Fund of Knowledge was assessed and was intact.  Cranial Nerves II - XII - II - Visual field intact OU. III, IV, VI - Extraocular movements intact. V - Facial sensation intact bilaterally. VII - Facial movement intact bilaterally. VIII - Hearing & vestibular intact bilaterally. X - Palate elevates symmetrically XI - Chin turning & shoulder shrug intact bilaterally. XII - Tongue protrusion intact.  Motor Strength - The patient's strength was normal in all extremities and pronator drift was absent.  Bulk was normal and fasciculations were absent.   Motor Tone - Muscle tone was assessed at the neck and appendages and was normal.  Reflexes - The patient's reflexes were 1+ in all extremities and he had no pathological reflexes.  Sensory - Light touch, temperature/pinprick were  assessed and were symmetrical.    Coordination - The patient had normal movements in the hands and feet with no ataxia or dysmetria.  Tremor was absent.  Gait and Station - deferred.   ASSESSMENT/PLAN [name] is a 56 y.o. male with history of DM, HLD, HTN, CAD with 5 stents in 2010 presenting with headache and word finding difficulty. He did not receive IV t-PA due to nonfocal stroke presentation.   Stroke:  Left occipital cortical and subcortical infarct, embolic pattern secondary to unknown source. Pt dose have multiple stroke risk factors including CAD/MI s/p stent, uncontrolled DM, HLD, smoking, however, the pattern warrants further embolic work up. Pt has no hx of HA, cognitive decline, first time stroke, normal inflammation markers, felt cerebral vasculitis less likely at this time. Will need to consider LP and cerebral angio if cognitive decline or continued HA.    Resultant  Mild frontal HA  MRI  Left small punctate occipital cortical and subcortical infarction  MRA head  unremarkable  MRA neck  L vertebral artery origin stenosis ~ 75%  2D Echo  pending  Transcranial Doppler  pending  Lower extremity Doppler pending  TEE to look for embolic source. Arranged with [place] for tomorrow.  (I have made patient NPO after midnight tonight).   If TEE negative, a [place] electrophysiologist will consult and consider placement of an implantable loop recorder to evaluate for atrial fibrillation as etiology of stroke. This has been explained to patient/family by Dr. Erlinda [name] and they are agreeable.   LDL 119  HgbA1c 11.3  Lovenox 40 mg sq daily for VTE prophylaxis  Diet heart healthy/carb modified Room service appropriate? Yes; Fluid consistency: Thin  aspirin 325 mg daily and clopidogrel 75 mg daily prior to admission, now on aspirin 325 mg daily and clopidogrel 75 mg daily. Continue DAPT for stroke and cardiac prevention  Patient counseled to be compliant with his antithrombotic medications  Ongoing aggressive stroke risk factor management  Therapy recommendations:  No PT  Disposition:  Return home  Hypertension  Stable Permissive hypertension (OK if < 220/120) but gradually normalize in 5-7 days Long-term BP goal normotensive  Hyperlipidemia  Home meds:  No statin  LDL 119, goal < 70  New lipitor 40 mg daily   Continue statin at discharge  Diabetes  Uncontrolled  HgbA1c 11.3, goal < 7.0  Recently stopped using home insulin  SSI  Needs close follow up with PCP for better DM control.   Tobacco abuse  Current heavy smoker  Smoking cessation counseling provided  Nicotine patch provided  Pt is willing to quit  Other Stroke Risk Factors  Coronary artery disease - MI, stents  Other Active Problems  Medical noncompliance - mostly driven by financial constraints  after losing insurance, now on new insurance. Willing to take medications at discharge.  [name] day # 0  Nicholas Hawking, MD PhD Stroke Neurology 02/11/2016 2:08 PM   To contact Stroke Continuity provider, please refer to [email or web address] After hours, contact General Neurology

## 2016-02-11 NOTE — Progress Notes (Signed)
PROGRESS NOTE    Nicholas [name]  ELF:[id number] DOB: [birth year] DOA: 02/09/2016 PCP: Purvis Kilts, MD    Brief Narrative:  56 y/o with HTN, DM, and tobacco use, presents to hospital with headache, difficulty with speech and performing basic tasks (recognizing family members, checking blood sugar). He was admitted to the hospital for CVA/TIA work up. Due to lack of neuro coverage and MRI services at [facility], patient will be transferred to [facility] to complete work up.  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 It is unclear if this represents a watershed infarct or distal embolic stroke from a LEFT MCA or LEFT PCA branch. MRA of the intracranial circulation is unremarkable. MRA of the extracranial circulation is concerning for a flow reducing lesion at the LEFT vertebral origin, suspected 75% or greater ostial stenosis. Consider CTA neck for further evaluation. -echo: The cavity size was normal. There was mild   concentric hypertrophy. Systolic function was normal. The   estimated ejection fraction was in the range of 55% to 60%. Wall   motion was normal; there were no regional wall motion   abnormalities. Doppler parameters are consistent with abnormal   left ventricular relaxation (grade 1 diastolic dysfunction).   There was no evidence of elevated ventricular filling pressure by   Doppler parameters. -aspirin and plavix for history of CAD. -LDL: 119 added statin -HgbA1C pending -tele: sinus/sinus tach  Diabetes. Previously on  tresiba and novolog, but discontinued these medications approximately one month ago due to loss of insurance and financial constraints.  -A1C is pending -lantus/SSI -diabetic coordinator consult   HTN. Previously on beta blocker, but has been off for a few months now, since he did not like the way he felt while taking it. Will continue to monitor blood pressures. If BP starts to trend up, may need to start on lisinopril  HLD.  LDL is elevated. Previously on lipitor. Restart on statin  Tobacco dependence. Counseled on the importance of cessation  Non compliance. Patient and family report that this was mostly driven by financial constraints after losing insurance. He is now back on a new insurance and is willing to take medications after discharge.   DVT prophylaxis: lovenox Code Status: full code Family Communication: discussed with patient and wife at the bedside Disposition Plan:    Consultants:   neuro  Procedures:   Echo:   Antimicrobials:      Subjective: No overnight events  Objective: Vitals:   02/10/16 1743 02/10/16 2200 02/11/16 0120 02/11/16 0539  BP: 122/68 110/71 104/64 114/64  Pulse: (!) 101 92 77 89  Resp: '20 18 18 18  '$ Temp: 98 F (36.7 C) 98.1 F (36.7 C) 97.9 F (36.6 C) 97.9 F (36.6 C)  TempSrc: Oral Oral Oral Oral  SpO2: 96% 94% 96% 93%  Weight:      Height:       No intake or output data in the 24 hours ending 02/11/16 0848 Filed Weights   02/09/16 1948 02/09/16 2311  Weight:  77.1 kg (170 lb) 69.2 kg (152 lb 8.9 oz)    Examination:  General exam: Appears calm and comfortable  Respiratory system: Clear to auscultation. Respiratory effort normal. Cardiovascular system: S1 & S2 heard, RRR. No JVD, murmurs, rubs, gallops or clicks. No pedal edema. Gastrointestinal system: Abdomen is nondistended, soft and nontender. No organomegaly or masses felt. Normal bowel sounds heard. Central nervous system: Alert and oriented. No focal neurological  deficits. Extremities: Symmetric 5 x 5 power.     Data Reviewed: I have personally reviewed following labs and imaging studies  CBC:  Recent Labs Lab 02/09/16 1959 02/09/16 2006  WBC 9.8  --   NEUTROABS 5.4  --   HGB 15.9 16.3  HCT 46.2 48.0  MCV 81.2  --   PLT 155  --    Basic Metabolic Panel:  Recent Labs Lab 02/09/16 1959 02/09/16 2006  NA 137 137  K 3.8 3.9  CL 100* 99*  CO2 27  --   GLUCOSE 260* 249*  BUN 11 10  CREATININE 0.73 0.70  CALCIUM 9.6  --    GFR: Estimated Creatinine Clearance: 99.8 mL/min (by C-G formula based on SCr of 0.7 mg/dL). Liver Function Tests:  Recent Labs Lab 02/09/16 1959  AST 16  ALT 21  ALKPHOS 75  BILITOT 0.5  PROT 7.1  ALBUMIN 4.1   No results for input(s): LIPASE, AMYLASE in the last 168 hours. No results for input(s): AMMONIA in the last 168 hours. Coagulation Profile:  Recent Labs Lab 02/09/16 1959  INR 0.95   Cardiac Enzymes:  Recent Labs Lab 02/10/16 0132 02/10/16 0554 02/10/16 1123  TROPONINI <0.03 <0.03 <0.03   BNP (last 3 results) No results for input(s): PROBNP in the last 8760 hours. HbA1C: No results for input(s): HGBA1C in the last 72 hours. CBG:  Recent Labs Lab 02/10/16 0803 02/10/16 1123 02/10/16 1738 02/10/16 2201 02/11/16 0659  GLUCAP 236* 254* 186* 275* 220*   Lipid Profile:  Recent Labs  02/10/16 0554  CHOL 212*  HDL 24*  LDLCALC 119*  TRIG 347*  CHOLHDL 8.8   Thyroid Function Tests:  Recent Labs  02/11/16 0546  TSH 0.904   Anemia Panel:  Recent Labs  02/11/16 0545  VITAMINB12 404   Sepsis Labs: No results for input(s): PROCALCITON, LATICACIDVEN in the last 168 hours.  No results found for this or any previous visit (from the past 240 hour(s)).       Radiology Studies: Mr Virgel Paling DT Contrast  Result Date: 02/10/2016 CLINICAL DATA:  Headache with confusion, symptoms began yesterday. Stroke risk factors include hypertension, diabetes, and  hyperlipidemia. EXAM: MRI HEAD WITHOUT AND WITH CONTRAST AND MRA HEAD WITHOUT CONTRAST AND MRI NECK WITHOUT AND WITH CONTRAST TECHNIQUE: Multiplanar, multiecho pulse sequences of the brain and surrounding structures were obtained without and with intravenous contrast. Angiographic images of the head were obtained using MRA technique without contrast. Multiplanar, multiecho pulse sequences of the neck and surrounding structures were obtained without and with intravenous contrast. CONTRAST:  88m MULTIHANCE GADOBENATE DIMEGLUMINE 529 MG/ML IV SOLN COMPARISON:  CT head 02/09/2016. FINDINGS: MRI HEAD FINDINGS The patient was unable to remain motionless for the exam. Small or subtle lesions could be overlooked. Brain: There is an acute nonhemorrhagic infarction involving LEFT occipital cortex and white matter. No similar findings elsewhere. Its location lies roughly between the middle and posterior cerebral arteries, and an acute watershed infarct is not excluded. Alternatively, distal LEFT MCA or PCA embolic stroke could have this  appearance. No hemorrhage, mass lesion, hydrocephalus, or extra-axial fluid. Normal for age cerebral volume. Mild subcortical and periventricular T2 and FLAIR hyperintensities, likely chronic microvascular ischemic change. Post infusion, no abnormal enhancement of the brain or meninges. Vascular: Normal flow voids. Skull and upper cervical spine: Normal marrow signal. Sinuses/Orbits: Minor mucosal thickening. No acute orbital findings. Other: Compared with yesterday's CT, the infarct is not visible. MRA HEAD FINDINGS The internal carotid arteries are widely patent. The basilar artery is widely patent with vertebrals codominant. There is no proximal stenosis of the anterior, middle, or posterior cerebral arteries. No visible MCA branch occlusion. Posterior cerebral arteries and cerebellar branches appear unremarkable. No visible saccular aneurysm. MRA NECK FINDINGS Conventional branching the  great vessels from the arch without proximal stenosis. Minor posterior wall plaque at the origin of the RIGHT internal carotid artery, no measurable stenosis. Minimal posterior wall plaque similarly at the origin of the LEFT internal carotid artery without measurable stenosis. No cervical ICA narrowing or dissection. At the origin of LEFT vertebral artery, there appears to be a focal 75% stenosis, potentially flow reducing. No similar ostial lesion on the RIGHT. Both vessels in the neck are widely patent. 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 It is unclear if this represents a watershed infarct or distal embolic stroke from a LEFT MCA or LEFT PCA branch. MRA of the intracranial circulation is unremarkable. MRA of the extracranial circulation is concerning for a flow reducing lesion at the LEFT vertebral origin, suspected 75% or greater ostial stenosis. Consider CTA neck for further evaluation. Electronically Signed   By: Staci Righter M.D.   On: 02/10/2016 17:56   Mr Jodene Nam Neck W Wo Contrast  Result Date: 02/10/2016 CLINICAL DATA:  Headache with confusion, symptoms began yesterday. Stroke risk factors include hypertension, diabetes, and hyperlipidemia. EXAM: MRI HEAD WITHOUT AND WITH CONTRAST AND MRA HEAD WITHOUT CONTRAST AND MRI NECK WITHOUT AND WITH CONTRAST TECHNIQUE: Multiplanar, multiecho pulse sequences of the brain and surrounding structures were obtained without and with intravenous contrast. Angiographic images of the head were obtained using MRA technique without contrast. Multiplanar, multiecho pulse sequences of the neck and surrounding structures were obtained without and with intravenous contrast. CONTRAST:  7m MULTIHANCE GADOBENATE DIMEGLUMINE 529 MG/ML IV SOLN COMPARISON:  CT head 02/09/2016. FINDINGS: MRI HEAD FINDINGS The patient was unable to remain motionless for the exam. Small or subtle lesions could be overlooked. Brain: There is an acute  nonhemorrhagic infarction involving LEFT occipital cortex and white matter. No similar findings elsewhere. Its location lies roughly between the middle and posterior cerebral arteries, and an acute watershed infarct is not excluded. Alternatively, distal LEFT MCA or PCA embolic stroke could have this appearance. No hemorrhage, mass lesion, hydrocephalus, or extra-axial fluid. Normal for age cerebral volume. Mild subcortical and periventricular T2 and FLAIR hyperintensities, likely chronic microvascular ischemic change. Post infusion, no abnormal enhancement of the brain or meninges. Vascular: Normal flow voids. Skull and upper cervical spine: Normal marrow signal. Sinuses/Orbits: Minor mucosal thickening. No acute orbital findings. Other: Compared with yesterday's CT, the infarct is not visible. MRA HEAD FINDINGS The internal carotid arteries are widely patent. The basilar artery is widely patent with vertebrals codominant. There is no proximal stenosis of the anterior, middle, or posterior cerebral arteries. No visible MCA branch occlusion. Posterior cerebral arteries and cerebellar branches appear unremarkable. No visible saccular aneurysm. MRA NECK FINDINGS Conventional branching the great vessels from the arch without proximal stenosis. Minor posterior wall plaque at the origin  of the RIGHT internal carotid artery, no measurable stenosis. Minimal posterior wall plaque similarly at the origin of the LEFT internal carotid artery without measurable stenosis. No cervical ICA narrowing or dissection. At the origin of LEFT vertebral artery, there appears to be a focal 75% stenosis, potentially flow reducing. No similar ostial lesion on the RIGHT. Both vessels in the neck are widely patent. 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 It is unclear if this represents a watershed infarct or distal embolic stroke from a LEFT MCA or LEFT PCA branch. MRA of the intracranial  circulation is unremarkable. MRA of the extracranial circulation is concerning for a flow reducing lesion at the LEFT vertebral origin, suspected 75% or greater ostial stenosis. Consider CTA neck for further evaluation. Electronically Signed   By: Staci Righter M.D.   On: 02/10/2016 17:56   Mr Jeri Cos LP Contrast  Result Date: 02/10/2016 CLINICAL DATA:  Headache with confusion, symptoms began yesterday. Stroke risk factors include hypertension, diabetes, and hyperlipidemia. EXAM: MRI HEAD WITHOUT AND WITH CONTRAST AND MRA HEAD WITHOUT CONTRAST AND MRI NECK WITHOUT AND WITH CONTRAST TECHNIQUE: Multiplanar, multiecho pulse sequences of the brain and surrounding structures were obtained without and with intravenous contrast. Angiographic images of the head were obtained using MRA technique without contrast. Multiplanar, multiecho pulse sequences of the neck and surrounding structures were obtained without and with intravenous contrast. CONTRAST:  66m MULTIHANCE GADOBENATE DIMEGLUMINE 529 MG/ML IV SOLN COMPARISON:  CT head 02/09/2016. FINDINGS: MRI HEAD FINDINGS The patient was unable to remain motionless for the exam. Small or subtle lesions could be overlooked. Brain: There is an acute nonhemorrhagic infarction involving LEFT occipital cortex and white matter. No similar findings elsewhere. Its location lies roughly between the middle and posterior cerebral arteries, and an acute watershed infarct is not excluded. Alternatively, distal LEFT MCA or PCA embolic stroke could have this appearance. No hemorrhage, mass lesion, hydrocephalus, or extra-axial fluid. Normal for age cerebral volume. Mild subcortical and periventricular T2 and FLAIR hyperintensities, likely chronic microvascular ischemic change. Post infusion, no abnormal enhancement of the brain or meninges. Vascular: Normal flow voids. Skull and upper cervical spine: Normal marrow signal. Sinuses/Orbits: Minor mucosal thickening. No acute orbital findings.  Other: Compared with yesterday's CT, the infarct is not visible. MRA HEAD FINDINGS The internal carotid arteries are widely patent. The basilar artery is widely patent with vertebrals codominant. There is no proximal stenosis of the anterior, middle, or posterior cerebral arteries. No visible MCA branch occlusion. Posterior cerebral arteries and cerebellar branches appear unremarkable. No visible saccular aneurysm. MRA NECK FINDINGS Conventional branching the great vessels from the arch without proximal stenosis. Minor posterior wall plaque at the origin of the RIGHT internal carotid artery, no measurable stenosis. Minimal posterior wall plaque similarly at the origin of the LEFT internal carotid artery without measurable stenosis. No cervical ICA narrowing or dissection. At the origin of LEFT vertebral artery, there appears to be a focal 75% stenosis, potentially flow reducing. No similar ostial lesion on the RIGHT. Both vessels in the neck are widely patent. 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 It is unclear if this represents a watershed infarct or distal embolic stroke from a LEFT MCA or LEFT PCA branch. MRA of the intracranial circulation is unremarkable. MRA of the extracranial circulation is concerning for a flow reducing lesion at the LEFT vertebral origin, suspected 75% or greater ostial stenosis. Consider CTA neck for further evaluation. Electronically Signed   By: JMadie Reno  Curnes M.D.   On: 02/10/2016 17:56   Ct Head Code Stroke W/o Cm  Result Date: 02/09/2016 CLINICAL DATA:  Code stroke. Sudden onset of headaches with confusion. EXAM: CT HEAD WITHOUT CONTRAST TECHNIQUE: Contiguous axial images were obtained from the base of the skull through the vertex without intravenous contrast. COMPARISON:  None. FINDINGS: Brain: No evidence of acute infarction, hemorrhage, hydrocephalus, extra-axial collection or mass lesion/mass effect. Normal cerebral volume. No  white matter disease. Vascular: No hyperdense vessel or unexpected calcification. Skull: Normal. Negative for fracture or focal lesion. Sinuses/Orbits: No acute finding. Other: None. ASPECTS [facility] Stroke Program Early CT Score) - Ganglionic level infarction (caudate, lentiform nuclei, internal capsule, insula, M1-M3 cortex): 7 - Supraganglionic infarction (M4-M6 cortex): 3 Total score (0-10 with 10 being normal): 10 IMPRESSION: 1. Negative exam. 2. ASPECTS is 10. 3. These results were called by telephone at the time of interpretation on 02/09/2016 at 8:23 pm to Dr. Noemi Chapel , who verbally acknowledged these results. Electronically Signed   By: Staci Righter M.D.   On: 02/09/2016 20:25        Scheduled Meds: . aspirin  325 mg Oral Daily  . atorvastatin  40 mg Oral q1800  . clopidogrel  75 mg Oral Daily  . enoxaparin (LOVENOX) injection  40 mg Subcutaneous Q24H  . insulin aspart  0-15 Units Subcutaneous TID WC  . insulin aspart  0-5 Units Subcutaneous QHS  . nicotine  21 mg Transdermal Daily  . pantoprazole  40 mg Oral Daily   Continuous Infusions: . sodium chloride 50 mL/hr at 02/10/16 2233     LOS: 0 days    Time spent: 25 mins    O'Donnell, DO [facility]   If 7PM-7AM, please contact night-coverage [email or web address] Password [facility] 02/11/2016, 8:48 AM

## 2016-02-11 NOTE — Progress Notes (Signed)
Initial Nutrition Assessment  DOCUMENTATION CODES:   Non-severe (moderate) malnutrition in context of acute illness/injury  INTERVENTION:  Provided and discussed "Heart Healthy Consistent Carbohydrate Nutrition Therapy" handout from the Academy of [employer] Dietetics Provide Glucerna Shake po BID with meals, each supplement provides 220 kcal and 10 grams of protein Multivitamin with minerals   NUTRITION DIAGNOSIS:   Malnutrition related to acute illness as evidenced by percent weight loss, mild depletion of body fat, mild depletion of muscle mass.   GOAL:   Patient will meet greater than or equal to 90% of their needs   MONITOR:   PO intake, Supplement acceptance, Labs, Weight trends, Skin  REASON FOR ASSESSMENT:   Consult Assessment of nutrition requirement/status  ASSESSMENT:   56 y/o with HTN, DM, and tobacco use, presents to hospital with headache, difficulty with speech and performing basic tasks (recognizing family members, checking blood sugar). He was admitted to the hospital for CVA/TIA work up. MRI found to be positive for CVA  Pt states that he was weighing 170 lbs one month ago. He is unsure why he has lost weight as he feels that he has been eating well/normally. Wife at bedside reports that patient sometimes skips 1-2 meals per daily. Pt states that he understands carb modified and heart healthy diet, but he was not following recommendations PTA. He states that this hospitalization has been a wake-up call for him and he plans to stop drinking regular soda and eat more vegetables.  Based on pt's report he has lost 10% of his body weight in the past month. This may be due to elevated glucose due to patient not taking insulin this past month. He has mild muscle wasting and mild fat wasting per nutrition-focused physical exam. He ate ~60% of lunch at time of RD visit, reporting that he was too full.   Labs: elevated glucose, hemoglobin A1c 11.3%, high triglycerides,  elevated cholesterol, low HDL  Diet Order:  Diet heart healthy/carb modified Room service appropriate? Yes; Fluid consistency: Thin Diet NPO time specified  Skin:  Reviewed, no issues  Last BM:  1/13  Height:   Ht Readings from Last 1 Encounters:  02/09/16 '5\' 8"'$  (1.727 m)    Weight:   Wt Readings from Last 1 Encounters:  02/09/16 152 lb 8.9 oz (69.2 kg)    Ideal Body Weight:  70 kg  BMI:  Body mass index is 23.2 kg/m.  Estimated Nutritional Needs:   Kcal:  1800-2000  Protein:  75-90 grams  Fluid:  2.1 L/day  EDUCATION NEEDS:   No education needs identified at this time  [place], CSP, LDN Inpatient Clinical Dietitian Pager: [phone] After Hours Pager: [phone]

## 2016-02-11 NOTE — Care Management Note (Signed)
Case Management Note  Patient Details  Name: Nicholas [name] MRN: [id number] Date of Birth: 1960/03/29  Subjective/Objective:              Patient was admitted with CVA. Lives at home with spouse. CM will follow for discharge needs pending PT/OT evals and physician orders.       Action/Plan:   Expected Discharge Date:                  Expected Discharge Plan:     In-House Referral:     Discharge planning Services     Post Acute Care Choice:    Choice offered to:     DME Arranged:    DME Agency:     HH Arranged:    HH Agency:     Status of Service:     If discussed at [employer] of Stay Meetings, dates discussed:    Additional Comments:  Rolm Baptise, RN 02/11/2016, 9:53 AM

## 2016-02-11 NOTE — Progress Notes (Signed)
*  PRELIMINARY RESULTS* Vascular Ultrasound Transcranial Doppler with Bubbles has been completed with Dr. Erlinda Hong. There is no obvious evidence of High Intensity Transient Signals (HITS) at rest and with Valsalva maneuver, therefore there is no evidence of Patent Foramen Ovale (PFO).  02/11/2016 3:19 PM Maudry Mayhew, BS, RVT, RDCS, RDMS

## 2016-02-11 NOTE — Progress Notes (Signed)
    [facility] has been requested to perform a transesophageal echocardiogram on this patient for stroke.  After careful review of history and examination, the risks and benefits of transesophageal echocardiogram have been explained including risks of esophageal damage, perforation (1:10,000 risk), bleeding, pharyngeal hematoma as well as other potential complications associated with conscious sedation including aspiration, arrhythmia, respiratory failure and death. Alternatives to treatment were discussed, questions were answered. Patient is willing to proceed. Scheduled for tomorrow at [place], PA-C 02/11/2016 4:33 PM

## 2016-02-12 ENCOUNTER — Encounter (HOSPITAL_COMMUNITY)
Admission: EM | Disposition: A | Discharge status: Home / Self Care | Payer: Self-pay | Source: Home / Self Care | Attending: Internal Medicine

## 2016-02-12 ENCOUNTER — Encounter (HOSPITAL_COMMUNITY): Payer: Self-pay

## 2016-02-12 ENCOUNTER — Inpatient Hospital Stay (HOSPITAL_COMMUNITY): Payer: BLUE CROSS/BLUE SHIELD

## 2016-02-12 ENCOUNTER — Ambulatory Visit (HOSPITAL_COMMUNITY): Admission: AD | Admit: 2016-02-12 | Payer: Self-pay | Source: Ambulatory Visit | Admitting: Internal Medicine

## 2016-02-12 DIAGNOSIS — I639 Cerebral infarction, unspecified: Secondary | ICD-10-CM

## 2016-02-12 DIAGNOSIS — G458 Other transient cerebral ischemic attacks and related syndromes: Secondary | ICD-10-CM

## 2016-02-12 HISTORY — PX: TEE WITHOUT CARDIOVERSION: SHX5443

## 2016-02-12 HISTORY — PX: EP IMPLANTABLE DEVICE: SHX172B

## 2016-02-12 LAB — CARDIOLIPIN ANTIBODIES, IGG, IGM, IGA: ANTICARDIOLIPIN IGM: 9 [MPL'U]/mL (ref 0–12)

## 2016-02-12 LAB — LUPUS ANTICOAGULANT PANEL
DRVVT: 37.7 s (ref 0.0–47.0)
PTT Lupus Anticoagulant: 35.4 s (ref 0.0–51.9)

## 2016-02-12 LAB — GLUCOSE, CAPILLARY
GLUCOSE-CAPILLARY: 274 mg/dL — AB (ref 65–99)
Glucose-Capillary: 257 mg/dL — ABNORMAL HIGH (ref 65–99)

## 2016-02-12 LAB — ANTINUCLEAR ANTIBODIES, IFA: ANA Ab, IFA: NEGATIVE

## 2016-02-12 LAB — HOMOCYSTEINE: HOMOCYSTEINE-NORM: 10.8 umol/L (ref 0.0–15.0)

## 2016-02-12 LAB — BETA-2-GLYCOPROTEIN I ABS, IGG/M/A

## 2016-02-12 LAB — ANTI-DNA ANTIBODY, DOUBLE-STRANDED: ds DNA Ab: <1 IU/mL (ref 0–9)

## 2016-02-12 SURGERY — LOOP RECORDER INSERTION

## 2016-02-12 SURGERY — ECHOCARDIOGRAM, TRANSESOPHAGEAL
Anesthesia: Moderate Sedation

## 2016-02-12 MED ORDER — DIPHENHYDRAMINE HCL 50 MG/ML IJ SOLN
INTRAMUSCULAR | Status: AC
  Filled 2016-02-12: qty 1

## 2016-02-12 MED ORDER — INSULIN DEGLUDEC 100 UNIT/ML ~~LOC~~ SOPN: 50.0000 [IU] | PEN_INJECTOR | Freq: Every day | SUBCUTANEOUS | 0 refills | Status: DC

## 2016-02-12 MED ORDER — LIDOCAINE-EPINEPHRINE 1 %-1:100000 IJ SOLN
INTRAMUSCULAR | Status: DC | PRN
  Administered 2016-02-12: 10 mL

## 2016-02-12 MED ORDER — ATORVASTATIN CALCIUM 40 MG PO TABS: 40.0000 mg | ORAL_TABLET | Freq: Every day | ORAL | 0 refills | Status: DC

## 2016-02-12 MED ORDER — SODIUM CHLORIDE 0.9 % IV SOLN
INTRAVENOUS | Status: DC
  Administered 2016-02-12: 500 mL via INTRAVENOUS

## 2016-02-12 MED ORDER — FENTANYL CITRATE (PF) 100 MCG/2ML IJ SOLN
INTRAMUSCULAR | Status: DC | PRN
  Administered 2016-02-12 (×2): 25 ug via INTRAVENOUS

## 2016-02-12 MED ORDER — MIDAZOLAM HCL 10 MG/2ML IJ SOLN
INTRAMUSCULAR | Status: DC | PRN
Start: 2016-02-12 — End: 2016-02-12
  Administered 2016-02-12: 2 mg via INTRAVENOUS
  Administered 2016-02-12: 1 mg via INTRAVENOUS

## 2016-02-12 MED ORDER — TRAMADOL HCL 50 MG PO TABS: 50.0000 mg | ORAL_TABLET | Freq: Four times a day (QID) | ORAL | 0 refills | Status: DC | PRN

## 2016-02-12 MED ORDER — FENTANYL CITRATE (PF) 100 MCG/2ML IJ SOLN
INTRAMUSCULAR | Status: AC
  Filled 2016-02-12: qty 2

## 2016-02-12 MED ORDER — NICOTINE 21 MG/24HR TD PT24: 21.0000 mg | MEDICATED_PATCH | Freq: Every day | TRANSDERMAL | 0 refills | Status: DC

## 2016-02-12 MED ORDER — BUTAMBEN-TETRACAINE-BENZOCAINE 2-2-14 % EX AERO
INHALATION_SPRAY | CUTANEOUS | Status: DC | PRN
  Administered 2016-02-12: 2 via TOPICAL

## 2016-02-12 MED ORDER — MIDAZOLAM HCL 5 MG/ML IJ SOLN
INTRAMUSCULAR | Status: AC
  Filled 2016-02-12: qty 2

## 2016-02-12 MED ORDER — BUPIVACAINE HCL (PF) 0.25 % IJ SOLN
INTRAMUSCULAR | Status: AC
  Filled 2016-02-12: qty 30

## 2016-02-12 SURGICAL SUPPLY — 2 items
LOOP REVEAL LINQSYS (Prosthesis & Implant Heart) ×2 IMPLANT
PACK LOOP INSERTION (CUSTOM PROCEDURE TRAY) ×3 IMPLANT

## 2016-02-12 NOTE — H&P (View-Only) (Signed)
    [facility] has been requested to perform a transesophageal echocardiogram on this patient for stroke.  After careful review of history and examination, the risks and benefits of transesophageal echocardiogram have been explained including risks of esophageal damage, perforation (1:10,000 risk), bleeding, pharyngeal hematoma as well as other potential complications associated with conscious sedation including aspiration, arrhythmia, respiratory failure and death. Alternatives to treatment were discussed, questions were answered. Patient is willing to proceed. Scheduled for tomorrow at [place], PA-C 02/11/2016 4:33 PM

## 2016-02-12 NOTE — CV Procedure (Signed)
    TEE  No embolic source identified Mild TR Trace MR Normal EF Chiari network in Right atrium (fetal remnant of no clinical consequence) Trivial late bubble right to left (no PFO or ASD).  Reassuring TEE Discussed with wife.  Candee Furbish, MD

## 2016-02-12 NOTE — Care Management Note (Signed)
Case Management Note  Patient Details  Name: Nicholas [name] MRN: [id number] Date of Birth: 15-Jun-1960  Subjective/Objective:                    Action/Plan: Pt discharging home with self care. No f/u per PT/OT and no DME needs. No further needs per CM.   Expected Discharge Date:  02/12/16               Expected Discharge Plan:  Home/Self Care  In-House Referral:     Discharge planning Services     Post Acute Care Choice:    Choice offered to:     DME Arranged:    DME Agency:     HH Arranged:    HH Agency:     Status of Service:  Completed, signed off  If discussed at [employer] of Stay Meetings, dates discussed:    Additional Comments:  Pollie Friar, RN 02/12/2016, 10:50 AM

## 2016-02-12 NOTE — Progress Notes (Signed)
  Echocardiogram 2D Echocardiogram has been performed.  Nicholas [name] 02/12/2016, 8:57 AM

## 2016-02-12 NOTE — Progress Notes (Signed)
Pt d/c to home by car with family. Assessment stable. Prescription given. All questions answered 

## 2016-02-12 NOTE — Interval H&P Note (Signed)
History and Physical Interval Note:  02/12/2016 8:13 AM  Nicholas [name]  has presented today for surgery, with the diagnosis of STROKE  The various methods of treatment have been discussed with the patient and family. After consideration of risks, benefits and other options for treatment, the patient has consented to  Procedure(s): TRANSESOPHAGEAL ECHOCARDIOGRAM (TEE) (N/A) as a surgical intervention .  The patient's history has been reviewed, patient examined, no change in status, stable for surgery.  I have reviewed the patient's chart and labs.  Questions were answered to the patient's satisfaction.     [employer]

## 2016-02-12 NOTE — Discharge Summary (Signed)
Physician Discharge Summary  Nicholas [name] QZR:[id number] DOB: 01-17-1961 DOA: 02/09/2016  PCP: Purvis Kilts, MD  Admit date: 02/09/2016 Discharge date: 02/12/2016   Recommendations for Outpatient Follow-Up:   1. Better blood sugar control-- has been out of medications due to insurance lapse-- restarted previous medication   Discharge Diagnosis:   Principal Problem:   TIA (transient ischemic attack) Active Problems:   TOBACCO ABUSE   Type 2 diabetes mellitus with vascular disease ([place])   Essential hypertension, benign   Hyperlipidemia   Right carotid bruit   Non compliance with medical treatment   Malnutrition of moderate degree   Acute CVA (cerebrovascular accident) [facility])   Discharge disposition:  Home.  Discharge Condition: Improved.  Diet recommendation: Low sodium, heart healthy.  Carbohydrate-modified.  Wound care: None.   History of Present Illness:   Nicholas [name] is a 56 y.o. male with medical history significant of DM, HLD, HTN, CAD with 5 stents in 2010 presenting with possible stroke.  About 2:30 pm he came home and spoke to wife on the phone, was fine. Ate and went to sleep.  She went to the store and briefly talked to him about 5.  Didn't notice anything unusual.  Went outside while he napped in the chair.  She called about 5:50 and he c/o bad headache so she came back in.  Very confused, ?low sugar.  Couldn't remember names, didn't know where he was.  Checked glucose, 215 (normal for him).   Called a nurse friend who suggested ER visit.  No dysphagia.  No slurring, just slow.  +left frontal headache and down his face.  No h/o similar.  He does not unusually get headaches.  Walked slowly but normally.  Hands shaking, very disoriented, couldn't remember how to check his blood sugar.   Hospital Course by Problem:   Stroke:  Left occipital cortical and subcortical infarct, embolic pattern secondary to unknown source. MRI  Left small punctate occipital  cortical and subcortical infarction  MRA head  unremarkable  MRA neck  L vertebral artery origin stenosis ~ 75%   2D Echo: Left ventricle: The cavity size was normal. There was mild    concentric hypertrophy. Systolic function was normal. The    estimated ejection fraction was in the range of 55% to 60%. Wall    motion was normal; there were no regional wall motion    abnormalities. Doppler parameters are consistent with abnormal    left ventricular relaxation (grade 1 diastolic dysfunction).     Transcranial Doppler no obvious evidence of High Intensity Transient Signals (HITS) at rest and with Valsalva  maneuver, therefore there is no evidence of Patent Foramen Ovale (PFO).  Lower extremity Doppler cancelled by neuro  TEE/loop recorder: No embolic source identified, Mild TR, Trace MR, Normal EF, Chiari network in Right atrium (fetal remnant of no clinical consequence)  Trivial late bubble right to left (no PFO or ASD).  LDL 119  HgbA1c 11.3  aspirin 325 mg daily and clopidogrel 75 mg daily prior to admission, now on aspirin 325 mg daily and clopidogrel 75 mg daily. Continue DAPT for stroke and cardiac prevention  Patient counseled to be compliant with his antithrombotic medications  Hypertension  Stable  Permissive hypertension (OK if < 220/120) but gradually normalize in 5-7 days  Long-term BP goal normotensive  Hyperlipidemia  Home meds:  No statin  LDL 119, goal < 70  New lipitor 40 mg daily   Continue statin at discharge  Diabetes  Uncontrolled  HgbA1c 11.3, goal < 7.0  Recently stopped using home insulin  Resume previous regimen  Tobacco abuse  Current heavy smoker  Smoking cessation counseling provided  Nicotine patch provided     Medical Consultants:    Neuro   Discharge Exam:   Vitals:   02/12/16 0836 02/12/16 0845  BP: 125/80 122/76  Pulse: 84 83  Resp: 18 12  Temp: 97.9 F (36.6 C)    Vitals:   02/12/16 0825 02/12/16  0830 02/12/16 0836 02/12/16 0845  BP: 129/69 123/76 125/80 122/76  Pulse: 80 88 84 83  Resp: (!) '21 19 18 12  '$ Temp:   97.9 F (36.6 C)   TempSrc:   Oral   SpO2: 100% 99% 95% 94%  Weight:      Height:        Gen:  NAD- anxious to get home   The results of significant diagnostics from this hospitalization (including imaging, microbiology, ancillary and laboratory) are listed below for reference.     Procedures and Diagnostic Studies:   Mr Virgel Paling EM Contrast  Result Date: 02/10/2016 CLINICAL DATA:  Headache with confusion, symptoms began yesterday. Stroke risk factors include hypertension, diabetes, and hyperlipidemia. EXAM: MRI HEAD WITHOUT AND WITH CONTRAST AND MRA HEAD WITHOUT CONTRAST AND MRI NECK WITHOUT AND WITH CONTRAST TECHNIQUE: Multiplanar, multiecho pulse sequences of the brain and surrounding structures were obtained without and with intravenous contrast. Angiographic images of the head were obtained using MRA technique without contrast. Multiplanar, multiecho pulse sequences of the neck and surrounding structures were obtained without and with intravenous contrast. CONTRAST:  65m MULTIHANCE GADOBENATE DIMEGLUMINE 529 MG/ML IV SOLN COMPARISON:  CT head 02/09/2016. FINDINGS: MRI HEAD FINDINGS The patient was unable to remain motionless for the exam. Small or subtle lesions could be overlooked. Brain: There is an acute nonhemorrhagic infarction involving LEFT occipital cortex and white matter. No similar findings elsewhere. Its location lies roughly between the middle and posterior cerebral arteries, and an acute watershed infarct is not excluded. Alternatively, distal LEFT MCA or PCA embolic stroke could have this appearance. No hemorrhage, mass lesion, hydrocephalus, or extra-axial fluid. Normal for age cerebral volume. Mild subcortical and periventricular T2 and FLAIR hyperintensities, likely chronic microvascular ischemic change. Post infusion, no abnormal enhancement of the brain  or meninges. Vascular: Normal flow voids. Skull and upper cervical spine: Normal marrow signal. Sinuses/Orbits: Minor mucosal thickening. No acute orbital findings. Other: Compared with yesterday's CT, the infarct is not visible. MRA HEAD FINDINGS The internal carotid arteries are widely patent. The basilar artery is widely patent with vertebrals codominant. There is no proximal stenosis of the anterior, middle, or posterior cerebral arteries. No visible MCA branch occlusion. Posterior cerebral arteries and cerebellar branches appear unremarkable. No visible saccular aneurysm. MRA NECK FINDINGS Conventional branching the great vessels from the arch without proximal stenosis. Minor posterior wall plaque at the origin of the RIGHT internal carotid artery, no measurable stenosis. Minimal posterior wall plaque similarly at the origin of the LEFT internal carotid artery without measurable stenosis. No cervical ICA narrowing or dissection. At the origin of LEFT vertebral artery, there appears to be a focal 75% stenosis, potentially flow reducing. No similar ostial lesion on the RIGHT. Both vessels in the neck are widely patent. 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 It is unclear if this represents a watershed infarct or distal embolic stroke from a LEFT MCA or LEFT PCA branch. MRA of the intracranial circulation is unremarkable.  MRA of the extracranial circulation is concerning for a flow reducing lesion at the LEFT vertebral origin, suspected 75% or greater ostial stenosis. Consider CTA neck for further evaluation. Electronically Signed   By: Staci Righter M.D.   On: 02/10/2016 17:56   Mr Jodene Nam Neck W Wo Contrast  Result Date: 02/10/2016 CLINICAL DATA:  Headache with confusion, symptoms began yesterday. Stroke risk factors include hypertension, diabetes, and hyperlipidemia. EXAM: MRI HEAD WITHOUT AND WITH CONTRAST AND MRA HEAD WITHOUT CONTRAST AND MRI NECK WITHOUT AND WITH  CONTRAST TECHNIQUE: Multiplanar, multiecho pulse sequences of the brain and surrounding structures were obtained without and with intravenous contrast. Angiographic images of the head were obtained using MRA technique without contrast. Multiplanar, multiecho pulse sequences of the neck and surrounding structures were obtained without and with intravenous contrast. CONTRAST:  24m MULTIHANCE GADOBENATE DIMEGLUMINE 529 MG/ML IV SOLN COMPARISON:  CT head 02/09/2016. FINDINGS: MRI HEAD FINDINGS The patient was unable to remain motionless for the exam. Small or subtle lesions could be overlooked. Brain: There is an acute nonhemorrhagic infarction involving LEFT occipital cortex and white matter. No similar findings elsewhere. Its location lies roughly between the middle and posterior cerebral arteries, and an acute watershed infarct is not excluded. Alternatively, distal LEFT MCA or PCA embolic stroke could have this appearance. No hemorrhage, mass lesion, hydrocephalus, or extra-axial fluid. Normal for age cerebral volume. Mild subcortical and periventricular T2 and FLAIR hyperintensities, likely chronic microvascular ischemic change. Post infusion, no abnormal enhancement of the brain or meninges. Vascular: Normal flow voids. Skull and upper cervical spine: Normal marrow signal. Sinuses/Orbits: Minor mucosal thickening. No acute orbital findings. Other: Compared with yesterday's CT, the infarct is not visible. MRA HEAD FINDINGS The internal carotid arteries are widely patent. The basilar artery is widely patent with vertebrals codominant. There is no proximal stenosis of the anterior, middle, or posterior cerebral arteries. No visible MCA branch occlusion. Posterior cerebral arteries and cerebellar branches appear unremarkable. No visible saccular aneurysm. MRA NECK FINDINGS Conventional branching the great vessels from the arch without proximal stenosis. Minor posterior wall plaque at the origin of the RIGHT internal  carotid artery, no measurable stenosis. Minimal posterior wall plaque similarly at the origin of the LEFT internal carotid artery without measurable stenosis. No cervical ICA narrowing or dissection. At the origin of LEFT vertebral artery, there appears to be a focal 75% stenosis, potentially flow reducing. No similar ostial lesion on the RIGHT. Both vessels in the neck are widely patent. 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 It is unclear if this represents a watershed infarct or distal embolic stroke from a LEFT MCA or LEFT PCA branch. MRA of the intracranial circulation is unremarkable. MRA of the extracranial circulation is concerning for a flow reducing lesion at the LEFT vertebral origin, suspected 75% or greater ostial stenosis. Consider CTA neck for further evaluation. Electronically Signed   By: JStaci RighterM.D.   On: 02/10/2016 17:56   Mr BJeri CosWFAContrast  Result Date: 02/10/2016 CLINICAL DATA:  Headache with confusion, symptoms began yesterday. Stroke risk factors include hypertension, diabetes, and hyperlipidemia. EXAM: MRI HEAD WITHOUT AND WITH CONTRAST AND MRA HEAD WITHOUT CONTRAST AND MRI NECK WITHOUT AND WITH CONTRAST TECHNIQUE: Multiplanar, multiecho pulse sequences of the brain and surrounding structures were obtained without and with intravenous contrast. Angiographic images of the head were obtained using MRA technique without contrast. Multiplanar, multiecho pulse sequences of the neck and surrounding structures were obtained without and with intravenous contrast.  CONTRAST:  64m MULTIHANCE GADOBENATE DIMEGLUMINE 529 MG/ML IV SOLN COMPARISON:  CT head 02/09/2016. FINDINGS: MRI HEAD FINDINGS The patient was unable to remain motionless for the exam. Small or subtle lesions could be overlooked. Brain: There is an acute nonhemorrhagic infarction involving LEFT occipital cortex and white matter. No similar findings elsewhere. Its location lies  roughly between the middle and posterior cerebral arteries, and an acute watershed infarct is not excluded. Alternatively, distal LEFT MCA or PCA embolic stroke could have this appearance. No hemorrhage, mass lesion, hydrocephalus, or extra-axial fluid. Normal for age cerebral volume. Mild subcortical and periventricular T2 and FLAIR hyperintensities, likely chronic microvascular ischemic change. Post infusion, no abnormal enhancement of the brain or meninges. Vascular: Normal flow voids. Skull and upper cervical spine: Normal marrow signal. Sinuses/Orbits: Minor mucosal thickening. No acute orbital findings. Other: Compared with yesterday's CT, the infarct is not visible. MRA HEAD FINDINGS The internal carotid arteries are widely patent. The basilar artery is widely patent with vertebrals codominant. There is no proximal stenosis of the anterior, middle, or posterior cerebral arteries. No visible MCA branch occlusion. Posterior cerebral arteries and cerebellar branches appear unremarkable. No visible saccular aneurysm. MRA NECK FINDINGS Conventional branching the great vessels from the arch without proximal stenosis. Minor posterior wall plaque at the origin of the RIGHT internal carotid artery, no measurable stenosis. Minimal posterior wall plaque similarly at the origin of the LEFT internal carotid artery without measurable stenosis. No cervical ICA narrowing or dissection. At the origin of LEFT vertebral artery, there appears to be a focal 75% stenosis, potentially flow reducing. No similar ostial lesion on the RIGHT. Both vessels in the neck are widely patent. 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 It is unclear if this represents a watershed infarct or distal embolic stroke from a LEFT MCA or LEFT PCA branch. MRA of the intracranial circulation is unremarkable. MRA of the extracranial circulation is concerning for a flow reducing lesion at the LEFT vertebral  origin, suspected 75% or greater ostial stenosis. Consider CTA neck for further evaluation. Electronically Signed   By: JStaci RighterM.D.   On: 02/10/2016 17:56   Ct Head Code Stroke W/o Cm  Result Date: 02/09/2016 CLINICAL DATA:  Code stroke. Sudden onset of headaches with confusion. EXAM: CT HEAD WITHOUT CONTRAST TECHNIQUE: Contiguous axial images were obtained from the base of the skull through the vertex without intravenous contrast. COMPARISON:  None. FINDINGS: Brain: No evidence of acute infarction, hemorrhage, hydrocephalus, extra-axial collection or mass lesion/mass effect. Normal cerebral volume. No white matter disease. Vascular: No hyperdense vessel or unexpected calcification. Skull: Normal. Negative for fracture or focal lesion. Sinuses/Orbits: No acute finding. Other: None. ASPECTS ([facility]) - Ganglionic level infarction (caudate, lentiform nuclei, internal capsule, insula, M1-M3 cortex): 7 - Supraganglionic infarction (M4-M6 cortex): 3 Total score (0-10 with 10 being normal): 10 IMPRESSION: 1. Negative exam. 2. ASPECTS is 10. 3. These results were called by telephone at the time of interpretation on 02/09/2016 at 8:23 pm to Dr. BNoemi Chapel, who verbally acknowledged these results. Electronically Signed   By: JStaci RighterM.D.   On: 02/09/2016 20:25     Labs:   Basic Metabolic Panel:  Recent Labs Lab 02/09/16 1959 02/09/16 2006  NA 137 137  K 3.8 3.9  CL 100* 99*  CO2 27  --   GLUCOSE 260* 249*  BUN 11 10  CREATININE 0.73 0.70  CALCIUM 9.6  --    GFR  Estimated Creatinine Clearance: 99.8 mL/min (by C-G formula based on SCr of 0.7 mg/dL). Liver Function Tests:  Recent Labs Lab 02/09/16 1959  AST 16  ALT 21  ALKPHOS 75  BILITOT 0.5  PROT 7.1  ALBUMIN 4.1   No results for input(s): LIPASE, AMYLASE in the last 168 hours. No results for input(s): AMMONIA in the last 168 hours. Coagulation profile  Recent Labs Lab 02/09/16 1959    INR 0.95    CBC:  Recent Labs Lab 02/09/16 1959 02/09/16 2006  WBC 9.8  --   NEUTROABS 5.4  --   HGB 15.9 16.3  HCT 46.2 48.0  MCV 81.2  --   PLT 155  --    Cardiac Enzymes:  Recent Labs Lab 02/10/16 0132 02/10/16 0554 02/10/16 1123  TROPONINI <0.03 <0.03 <0.03   BNP: Invalid input(s): POCBNP CBG:  Recent Labs Lab 02/11/16 1124 02/11/16 1704 02/11/16 2155 02/12/16 0628 02/12/16 1021  GLUCAP 234* 230* 243* 274* 257*   D-Dimer No results for input(s): DDIMER in the last 72 hours. Hgb A1c  Recent Labs  02/10/16 1123  HGBA1C 11.3*   Lipid Profile  Recent Labs  02/10/16 0554  CHOL 212*  HDL 24*  LDLCALC 119*  TRIG 347*  CHOLHDL 8.8   Thyroid function studies  Recent Labs  02/11/16 0546  TSH 0.904   Anemia work up  Recent Labs  02/11/16 [place]   Microbiology No results found for this or any previous visit (from the past 240 hour(s)).   Discharge Instructions:   Discharge Instructions    Diet - low sodium heart healthy    Complete by:  As directed    Diet Carb Modified    Complete by:  As directed    Discharge instructions    Complete by:  As directed    Monitor blood sugar and bring log to PCP/Dr. Dorris Fetch   Increase activity slowly    Complete by:  As directed      Allergies as of 02/12/2016   No Known Allergies     Medication List    TAKE these medications   acetaminophen 500 MG tablet Commonly known as:  TYLENOL Take 1,000 mg by mouth every 6 (six) hours as needed.   aspirin 325 MG EC tablet Take 325 mg by mouth daily.   atorvastatin 40 MG tablet Commonly known as:  LIPITOR Take 1 tablet (40 mg total) by mouth daily at 6 PM.   clopidogrel 75 MG tablet Commonly known as:  PLAVIX TAKE ONE TABLET BY MOUTH ONCE DAILY.   glucose blood test strip Commonly known as:  BAYER CONTOUR TEST Use as instructed 4 x daily E11.65. Contour plus test strips   insulin degludec 100 UNIT/ML Sopn FlexTouch  Pen Commonly known as:  TRESIBA Inject 0.5 mLs (50 Units total) into the skin daily at 10 pm.   nicotine 21 mg/24hr patch Commonly known as:  NICODERM CQ - dosed in mg/24 hours Place 1 patch (21 mg total) onto the skin daily.   NITROSTAT 0.4 MG SL tablet Generic drug:  nitroGLYCERIN DISSOLVE 1 TABLET UNDER TONGUE EVERY 5 MINUTES UP TO 15 MIN FOR CHESTPAIN. IF NO RELIEF CALL 911.   pantoprazole 40 MG tablet Commonly known as:  PROTONIX Take 1 tablet by mouth daily.   traMADol 50 MG tablet Commonly known as:  ULTRAM Take 1 tablet (50 mg total) by mouth every 6 (six) hours as needed for moderate pain. What changed:  when to  take this  reasons to take this      [place] [facility] Follow up on 02/25/2016.   Specialty:  Cardiology Why:  4:30PM, wound check Contact information: [address] [place]           Time coordinating discharge: 35 min  Signed:  Porshea Janowski Alison Stalling   [facility] 02/12/2016, 10:28 AM

## 2016-02-12 NOTE — Consult Note (Signed)
ELECTROPHYSIOLOGY CONSULT NOTE  Patient ID: [name] MRN: [id number], DOB/AGE: 57-17-1962   Admit date: 02/09/2016 Date of Consult: 02/12/2016  Primary Physician: Purvis Kilts, MD Primary Cardiologist: Dr. Domenic Polite Reason for Consultation: Cryptogenic stroke ; recommendations regarding Implantable Loop Recorder Requesting MD: Dr. Erlinda Hong  History of Present Illness Nicholas [name] was admitted on 02/09/2016 with headache, memory and speech problems.   PMHx noted for poorly controlelled DM, CAD (last PCI 2010), HLD, HTN, and an ongoing smoker.  He first developed symptoms while at home.  Imaging demonstrated Left occipital cortical and subcortical infarct, embolic pattern secondary to unknown source.  he has undergone workup for stroke including echocardiogram and carotid angio (MRA), d/w neurology NP, no further dopplers needed.  The patient has been monitored on telemetry which has demonstrated sinus rhythm with no arrhythmias.  Inpatient stroke work-up is to be completed with a TEE.   Echocardiogram this admission demonstrated Study Conclusions - Left ventricle: The cavity size was normal. There was mild   concentric hypertrophy. Systolic function was normal. The   estimated ejection fraction was in the range of 55% to 60%. Wall   motion was normal; there were no regional wall motion   abnormalities. Doppler parameters are consistent with abnormal   left ventricular relaxation (grade 1 diastolic dysfunction).   There was no evidence of elevated ventricular filling pressure by   Doppler parameters. - Aortic valve: Trileaflet; mildly thickened, mildly calcified   leaflets. Transvalvular velocity was within the normal range.   There was no stenosis. There was no regurgitation. - Aortic root: The aortic root was normal in size. - Mitral valve: Structurally normal valve. There was no   regurgitation. - Left atrium: The atrium was normal in size. - Right ventricle: Systolic  function was normal. - Right atrium: The atrium was normal in size. - Tricuspid valve: There was trivial regurgitation. - Pulmonary arteries: Systolic pressure was within the normal   range. - Inferior vena cava: The vessel was normal in size. - Pericardium, extracardiac: There was no pericardial effusion.   Lab work is reviewed.  Prior to admission, the patient denies chest pain, shortness of breath, dizziness, palpitations, or syncope.  They are recovering from their stroke with plans to home at discharge.  EP has been asked to evaluate for placement of an implantable loop recorder to monitor for atrial fibrillation.     Past Medical History:  Diagnosis Date  . Anal lesion    Anal papilla/tags - external lesion  . Colon polyp    Hyperplastic rectosigmoid polyp 9/08  . Coronary atherosclerosis of native coronary artery    DES to PLB, BMS to mid and distal RCA, DES to circumflex and distal LAD, 6/10 (5 stents)  . GERD (gastroesophageal reflux disease)   . Helicobacter pylori (H. pylori) 09/2009   Treated with helidac  . Hiatal hernia   . Hyperplastic colon polyp 2008  . Mixed hyperlipidemia   . Myocardial infarction 06/2008  . Type 2 diabetes mellitus [facility])      Surgical History:  Past Surgical History:  Procedure Laterality Date  . CHOLECYSTECTOMY    . COLLAPSED LUNG     15 years ago  . COLONOSCOPY  10/12/2006   Dr. Gala Romney- rectosigmoid polyp s/phot snare polypectomy o/w normal rectum and terminal ileum. hyperplastic polyp on bx  . ESOPHAGOGASTRODUODENOSCOPY  10/12/2006   Dr. Gala Romney- examination of the tubular esophagus revealed no mucosal abnormalities. the EG junction was easily traversed. small hiatal  hernia, the gastric mucosa o/w appeared normal. there was no infiltrating process or frank ulcer seen.  Geronimo Boot IN APEX OF RIGHT LUNG     Since childhood     Prescriptions Prior to Admission  Medication Sig Dispense Refill Last Dose  . acetaminophen (TYLENOL) 500 MG  tablet Take 1,000 mg by mouth every 6 (six) hours as needed.   02/09/2016 at Unknown time  . aspirin 325 MG EC tablet Take 325 mg by mouth daily.     02/08/2016 at Unknown time  . clopidogrel (PLAVIX) 75 MG tablet TAKE ONE TABLET BY MOUTH ONCE DAILY. 30 tablet 11 02/08/2016 at Unknown time  . glucose blood (BAYER CONTOUR TEST) test strip Use as instructed 4 x daily E11.65. Contour plus test strips 150 each 5 02/09/2016 at Unknown time  . pantoprazole (PROTONIX) 40 MG tablet Take 1 tablet by mouth daily.    02/08/2016 at Unknown time  . traMADol (ULTRAM) 50 MG tablet Take 50 mg by mouth daily.    02/08/2016 at Unknown time  . NITROSTAT 0.4 MG SL tablet DISSOLVE 1 TABLET UNDER TONGUE EVERY 5 MINUTES UP TO 15 MIN FOR CHESTPAIN. IF NO RELIEF CALL 911. 25 tablet 1 unknown    Inpatient Medications:  . aspirin  325 mg Oral Daily  . atorvastatin  40 mg Oral q1800  . clopidogrel  75 mg Oral Daily  . enoxaparin (LOVENOX) injection  40 mg Subcutaneous Q24H  . feeding supplement (GLUCERNA SHAKE)  237 mL Oral BID WC  . insulin aspart  0-15 Units Subcutaneous TID WC  . insulin aspart  0-5 Units Subcutaneous QHS  . insulin glargine  20 Units Subcutaneous QHS  . multivitamin with minerals  1 tablet Oral Daily  . nicotine  21 mg Transdermal Daily  . pantoprazole  40 mg Oral Daily    Allergies: No Known Allergies  Social History   Social History  . Marital status: Married    Spouse name: N/A  . Number of children: N/A  . Years of education: N/A   Occupational History  . maintenance, parking lots Self-Employed   Social History Main Topics  . Smoking status: Heavy Tobacco Smoker    Packs/day: 3.00    Years: 34.00    Types: Cigarettes  . Smokeless tobacco: Never Used  . Alcohol use No  . Drug use: No  . Sexual activity: Yes    Partners: Female    Birth control/ protection: Pill   Other Topics Concern  . Not on file   Social History Narrative  . No narrative on file     Family History    Problem Relation Age of Onset  . Diabetes Mother   . Heart disease Father   . Cancer Brother   . CVA Neg Hx       Review of Systems: All other systems reviewed and are otherwise negative except as noted above.  Physical Exam: Vitals:   02/11/16 1706 02/11/16 2150 02/12/16 0100 02/12/16 0533  BP: 113/70 115/73 121/76 109/73  Pulse: 78 89 64 78  Resp: '16 20 18 18  '$ Temp: 98.7 F (37.1 C) 98.5 F (36.9 C) 97.7 F (36.5 C) 97.5 F (36.4 C)  TempSrc: Oral Oral Oral Oral  SpO2: 96% 94% 98% 97%  Weight:      Height:        GEN- The patient is well appearing, alert and oriented x 3 today.   Head- normocephalic, atraumatic Eyes-  Sclera clear, conjunctiva pink Ears-  hearing intact Oropharynx- clear Neck- supple Lungs- Clear to ausculation bilaterally, normal work of breathing Heart- RRR, no murmurs, rubs or gallops  GI- soft, NT, ND Extremities- no clubbing, cyanosis, or edema MS- no significant deformity or atrophy Skin- no rash or lesion Psych- euthymic mood, full affect   Labs:   Lab Results  Component Value Date   WBC 9.8 02/09/2016   HGB 16.3 02/09/2016   HCT 48.0 02/09/2016   MCV 81.2 02/09/2016   PLT 155 02/09/2016    Recent Labs Lab 02/09/16 1959 02/09/16 2006  NA 137 137  K 3.8 3.9  CL 100* 99*  CO2 27  --   BUN 11 10  CREATININE 0.73 0.70  CALCIUM 9.6  --   PROT 7.1  --   BILITOT 0.5  --   ALKPHOS 75  --   ALT 21  --   AST 16  --   GLUCOSE 260* 249*   Lab Results  Component Value Date   CKTOTAL 115 07/01/2008   CKMB 3.0 07/01/2008   TROPONINI <0.03 02/10/2016   Lab Results  Component Value Date   CHOL 212 (H) 02/10/2016   CHOL 226 (H) 07/17/2014   CHOL 185 01/06/2012   Lab Results  Component Value Date   HDL 24 (L) 02/10/2016   HDL 30 (L) 07/17/2014   HDL 25 (L) 01/06/2012   Lab Results  Component Value Date   LDLCALC 119 (H) 02/10/2016   LDLCALC 133 (H) 07/17/2014   LDLCALC 106 (H) 01/06/2012   Lab Results  Component  Value Date   TRIG 347 (H) 02/10/2016   TRIG 317 (H) 07/17/2014   TRIG 268 (H) 01/06/2012   Lab Results  Component Value Date   CHOLHDL 8.8 02/10/2016   CHOLHDL 7.5 07/17/2014   CHOLHDL 7.4 01/06/2012   No results found for: LDLDIRECT  Lab Results  Component Value Date   DDIMER  05/24/2009    0.38        AT THE INHOUSE ESTABLISHED CUTOFF VALUE OF 0.48 ug/mL FEU, THIS ASSAY HAS BEEN DOCUMENTED IN THE LITERATURE TO HAVE A SENSITIVITY AND NEGATIVE PREDICTIVE VALUE OF AT LEAST 98 TO 99%.  THE TEST RESULT SHOULD BE CORRELATED WITH AN ASSESSMENT OF THE CLINICAL PROBABILITY OF DVT / VTE.     Radiology/Studies:  Mr Virgel Paling YW Contrast Result Date: 02/10/2016 CLINICAL DATA:  Headache with confusion, symptoms began yesterday. Stroke risk factors include hypertension, diabetes, and hyperlipidemia. EXAM: MRI HEAD WITHOUT AND WITH CONTRAST AND MRA HEAD WITHOUT CONTRAST AND MRI NECK WITHOUT AND WITH CONTRAST TECHNIQUE: Multiplanar, multiecho pulse sequences of the brain and surrounding structures were obtained without and with intravenous contrast. Angiographic images of the head were obtained using MRA technique without contrast. Multiplanar, multiecho pulse sequences of the neck and surrounding structures were obtained without and with intravenous contrast. CONTRAST:  28m MULTIHANCE GADOBENATE DIMEGLUMINE 529 MG/ML IV SOLN COMPARISON:  CT head 02/09/2016. FINDINGS: MRI HEAD FINDINGS The patient was unable to remain motionless for the exam. Small or subtle lesions could be overlooked. Brain: There is an acute nonhemorrhagic infarction involving LEFT occipital cortex and white matter. No similar findings elsewhere. Its location lies roughly between the middle and posterior cerebral arteries, and an acute watershed infarct is not excluded. Alternatively, distal LEFT MCA or PCA embolic stroke could have this appearance. No hemorrhage, mass lesion, hydrocephalus, or extra-axial fluid. Normal for age  cerebral volume. Mild subcortical and periventricular T2 and FLAIR hyperintensities, likely chronic microvascular ischemic change.  Post infusion, no abnormal enhancement of the brain or meninges. Vascular: Normal flow voids. Skull and upper cervical spine: Normal marrow signal. Sinuses/Orbits: Minor mucosal thickening. No acute orbital findings. Other: Compared with yesterday's CT, the infarct is not visible. MRA HEAD FINDINGS The internal carotid arteries are widely patent. The basilar artery is widely patent with vertebrals codominant. There is no proximal stenosis of the anterior, middle, or posterior cerebral arteries. No visible MCA branch occlusion. Posterior cerebral arteries and cerebellar branches appear unremarkable. No visible saccular aneurysm. MRA NECK FINDINGS Conventional branching the great vessels from the arch without proximal stenosis. Minor posterior wall plaque at the origin of the RIGHT internal carotid artery, no measurable stenosis. Minimal posterior wall plaque similarly at the origin of the LEFT internal carotid artery without measurable stenosis. No cervical ICA narrowing or dissection. At the origin of LEFT vertebral artery, there appears to be a focal 75% stenosis, potentially flow reducing. No similar ostial lesion on the RIGHT. Both vessels in the neck are widely patent. 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 It is unclear if this represents a watershed infarct or distal embolic stroke from a LEFT MCA or LEFT PCA branch. MRA of the intracranial circulation is unremarkable. MRA of the extracranial circulation is concerning for a flow reducing lesion at the LEFT vertebral origin, suspected 75% or greater ostial stenosis. Consider CTA neck for further evaluation. Electronically Signed   By: Staci Righter M.D.   On: 02/10/2016 17:56    Ct Head Code Stroke W/o Cm Result Date: 02/09/2016 CLINICAL DATA:  Code stroke. Sudden onset of headaches  with confusion. EXAM: CT HEAD WITHOUT CONTRAST TECHNIQUE: Contiguous axial images were obtained from the base of the skull through the vertex without intravenous contrast. COMPARISON:  None. FINDINGS: Brain: No evidence of acute infarction, hemorrhage, hydrocephalus, extra-axial collection or mass lesion/mass effect. Normal cerebral volume. No white matter disease. Vascular: No hyperdense vessel or unexpected calcification. Skull: Normal. Negative for fracture or focal lesion. Sinuses/Orbits: No acute finding. Other: None. ASPECTS [facility] Stroke Program Early CT Score) - Ganglionic level infarction (caudate, lentiform nuclei, internal capsule, insula, M1-M3 cortex): 7 - Supraganglionic infarction (M4-M6 cortex): 3 Total score (0-10 with 10 being normal): 10 IMPRESSION: 1. Negative exam. 2. ASPECTS is 10. 3. These results were called by telephone at the time of interpretation on 02/09/2016 at 8:23 pm to Dr. Noemi Chapel , who verbally acknowledged these results. Electronically Signed   By: Staci Righter M.D.   On: 02/09/2016 20:25    12-lead ECG all EKGs SR All prior EKG's in EPIC reviewed with no documented atrial fibrillation  Telemetry SR, one very brief PAT  Assessment and Plan:  1. Cryptogenic stroke The patient presents with cryptogenic stroke.  The patient has a TEE planned for this AM.  I spoke at length with the patient about monitoring for afib with either a 30 day event monitor or an implantable loop recorder.  Risks, benefits, and alteratives to implantable loop recorder were discussed with the patient today.   At this time, the patient is very clear in their decision to proceed with implantable loop recorder.   Wound care was reviewed with the patient (keep incision clean and dry for 3 days).  Wound check will be scheduled for the patient  Please call with questions.   Renee Dyane Dustman, PA-C 02/12/2016   I have seen, examined the patient, and reviewed the above assessment and plan.   On exam, comfortable appearing, RRR.  Changes to above are made where necessary.    Co Sign: Thompson Grayer, MD 02/12/2016 8:54 AM

## 2016-02-12 NOTE — Progress Notes (Signed)
STROKE TEAM PROGRESS NOTE   SUBJECTIVE (INTERVAL HISTORY) Wife is at bedside. Pt just came back from TEE and loop. No complains. Again stressed life style changes and quit smoking, compliance with medication.    OBJECTIVE Temp:  [97.5 F (36.4 C)-98.7 F (37.1 C)] 97.9 F (36.6 C) (01/16 0836) Pulse Rate:  [64-91] 83 (01/16 0845) Cardiac Rhythm: Normal sinus rhythm (01/16 0830) Resp:  [12-21] 12 (01/16 0845) BP: (109-136)/(64-83) 122/76 (01/16 0845) SpO2:  [94 %-100 %] 94 % (01/16 0845) Weight:  [165 lb (74.8 kg)] 165 lb (74.8 kg) (01/16 0730)  CBC:   Recent Labs Lab 02/09/16 1959 02/09/16 2006  WBC 9.8  --   NEUTROABS 5.4  --   HGB 15.9 16.3  HCT 46.2 48.0  MCV 81.2  --   PLT 155  --     Basic Metabolic Panel:   Recent Labs Lab 02/09/16 1959 02/09/16 2006  NA 137 137  K 3.8 3.9  CL 100* 99*  CO2 27  --   GLUCOSE 260* 249*  BUN 11 10  CREATININE 0.73 0.70  CALCIUM 9.6  --     Lipid Panel:     Component Value Date/Time   CHOL 212 (H) 02/10/2016 0554   TRIG 347 (H) 02/10/2016 0554   HDL 24 (L) 02/10/2016 0554   CHOLHDL 8.8 02/10/2016 0554   VLDL 69 (H) 02/10/2016 0554   LDLCALC 119 (H) 02/10/2016 0554   HgbA1c:  Lab Results  Component Value Date   HGBA1C 11.3 (H) 02/10/2016   Urine Drug Screen:     Component Value Date/Time   LABOPIA NONE DETECTED 02/09/2016 2230   COCAINSCRNUR NONE DETECTED 02/09/2016 2230   LABBENZ NONE DETECTED 02/09/2016 2230   AMPHETMU NONE DETECTED 02/09/2016 2230   THCU NONE DETECTED 02/09/2016 2230   LABBARB NONE DETECTED 02/09/2016 2230    IMAGING I have personally reviewed the radiological images below and agree with the radiology interpretations.  Ct Head Code Stroke W/o Cm 02/09/2016 1. Negative exam. 2. ASPECTS is 10  Mr Jeri Cos Wo Contrast Mr [facility] Contrast Mr Jodene Nam Neck W Wo Contrast 02/10/2016 Acute nonhemorrhagic LEFT occipital cortical and subcortical infarction. It is unclear if this represents a  watershed infarct or distal embolic stroke from a LEFT MCA or LEFT PCA branch. MRA of the intracranial circulation is unremarkable. MRA of the extracranial circulation is concerning for a flow reducing lesion at the LEFT vertebral origin, suspected 75% or greater ostial stenosis.  TCD bubble study - negative for PFO  TTE Left ventricle: The cavity size was normal. There was mild   concentric hypertrophy. Systolic function was normal. The   estimated ejection fraction was in the range of 55% to 60%. Wall   motion was normal; there were no regional wall motion   abnormalities. Doppler parameters are consistent with abnormal   left ventricular relaxation (grade 1 diastolic dysfunction).   There was no evidence of elevated ventricular filling pressure by   Doppler parameters. - Aortic valve: Trileaflet; mildly thickened, mildly calcified   leaflets. Transvalvular velocity was within the normal range.   There was no stenosis. There was no regurgitation. - Aortic root: The aortic root was normal in size. - Mitral valve: Structurally normal valve. There was no   regurgitation. - Left atrium: The atrium was normal in size. - Right ventricle: Systolic function was normal. - Right atrium: The atrium was normal in size. - Tricuspid valve: There was trivial regurgitation. - Pulmonary arteries:  Systolic pressure was within the normal   range. - Inferior vena cava: The vessel was normal in size. - Pericardium, extracardiac: There was no pericardial effusion.  TEE  No embolic source identified Mild TR Trace MR Normal EF Chiari network in Right atrium (fetal remnant of no clinical consequence) Trivial late bubble right to left (no PFO or ASD).  Hypercoagulable pending   PHYSICAL EXAM  Temp:  [97.5 F (36.4 C)-98.7 F (37.1 C)] 97.9 F (36.6 C) (01/16 0836) Pulse Rate:  [64-91] 83 (01/16 0845) Resp:  [12-21] 12 (01/16 0845) BP: (109-136)/(64-83) 122/76 (01/16 0845) SpO2:  [94 %-100 %]  94 % (01/16 0845) Weight:  [165 lb (74.8 kg)] 165 lb (74.8 kg) (01/16 0730)  General - Well nourished, well developed, in no apparent distress.  Ophthalmologic - Fundi not visualized due to eye movement.  Cardiovascular - Regular rate and rhythm.  Mental Status -  Level of arousal and orientation to time, place, and person were intact. Language including expression, naming, repetition, comprehension was assessed and found intact. Fund of Knowledge was assessed and was intact.  Cranial Nerves II - XII - II - Visual field intact OU. III, IV, VI - Extraocular movements intact. V - Facial sensation intact bilaterally. VII - Facial movement intact bilaterally. VIII - Hearing & vestibular intact bilaterally. X - Palate elevates symmetrically XI - Chin turning & shoulder shrug intact bilaterally. XII - Tongue protrusion intact.  Motor Strength - The patient's strength was normal in all extremities and pronator drift was absent.  Bulk was normal and fasciculations were absent.   Motor Tone - Muscle tone was assessed at the neck and appendages and was normal.  Reflexes - The patient's reflexes were 1+ in all extremities and he had no pathological reflexes.  Sensory - Light touch, temperature/pinprick were assessed and were symmetrical.    Coordination - The patient had normal movements in the hands and feet with no ataxia or dysmetria.  Tremor was absent.  Gait and Station - deferred.   ASSESSMENT/PLAN [name] is a 56 y.o. male with history of DM, HLD, HTN, CAD with 5 stents in 2010 presenting with headache and word finding difficulty. He did not receive IV t-PA due to nonfocal stroke presentation.   Stroke:  Left occipital cortical and subcortical infarct, embolic pattern secondary to unknown source. Pt dose have multiple stroke risk factors including CAD/MI s/p stent, uncontrolled DM, HLD, smoking, however, the pattern and hx of MI s/p stent warrants further embolic work  up. Pt has no hx of HA, cognitive decline, first time stroke, normal inflammation markers, felt cerebral vasculitis less likely at this time. Will need to consider LP and cerebral angio if cognitive decline or continued HA.   Resultant  Mild frontal HA  MRI  Left small punctate occipital cortical and subcortical infarction  MRA head  unremarkable  MRA neck  L vertebral artery origin stenosis ~ 75%  2D Echo  EF 55-60%  Transcranial Doppler  Negative for PFO  TEE no SOE, no PFO  Loop recorder placed.   LDL 119  HgbA1c 11.3  Hypercoagulable labs pending  Lovenox 40 mg sq daily for VTE prophylaxis Diet heart healthy/carb modified Room service appropriate? Yes; Fluid consistency: Thin Diet - low sodium heart healthy Diet Carb Modified  aspirin 325 mg daily and clopidogrel 75 mg daily prior to admission, now on aspirin 325 mg daily and clopidogrel 75 mg daily. Continue DAPT for stroke and cardiac prevention.  Patient  counseled to be compliant with his antithrombotic medications  Ongoing aggressive stroke risk factor management  Therapy recommendations:  No PT  Disposition:  Return home  Hypertension  Stable Permissive hypertension (OK if < 220/120) but gradually normalize in 5-7 days Long-term BP goal normotensive  Hyperlipidemia  Home meds:  No statin  LDL 119, goal < 70  New lipitor 40 mg daily   Continue statin at discharge  Diabetes  Uncontrolled  HgbA1c 11.3, goal < 7.0  Recently stopped using home insulin  SSI  Needs close follow up with PCP for better DM control.   Tobacco abuse  Current heavy smoker  Smoking cessation counseling provided  Nicotine patch provided  Pt is willing to quit  Other Stroke Risk Factors  Coronary artery disease - MI, stents  Other Active Problems  Medical noncompliance - mostly driven by financial constraints after losing insurance, now on new insurance. Willing to take medications at discharge.  Hospital  day # 1   Neurology will sign off. Please call with questions. Pt will follow up with Cecille Rubin NP at [facility] in about 6 weeks. Thanks for the consult.   Rosalin Hawking, MD PhD Stroke Neurology 02/12/2016 12:08 PM   To contact Stroke Continuity provider, please refer to [email or web address] After hours, contact General Neurology

## 2016-02-12 NOTE — Discharge Instructions (Signed)
Keep incision clean and dry (no shower) for 3 days. You can remove outer dressing tomorrow. Leave steri-strips (little pieces of tape) on until seen in the office for wound check appointment. Call the office [phone]) for redness, drainage, swelling, or fever.

## 2016-02-13 ENCOUNTER — Encounter (HOSPITAL_COMMUNITY): Payer: Self-pay | Admitting: Cardiology

## 2016-02-18 ENCOUNTER — Other Ambulatory Visit: Payer: Self-pay

## 2016-02-18 LAB — ALPHA GALACTOSIDASE: Alpha galactosidase, serum: 52.3 nmol/hr/mg prt (ref 28.0–80.0)

## 2016-02-18 NOTE — Patient Outreach (Signed)
[facility]) Care Management  02/18/2016  Nicholas [name] 28-Jan-1961 [id number]  REFERRAL DATE: 02/18/16 REFERRAL SOURCE; EMMI stroke transition program REFERRAL REASON; EMMI stroke red alert: Questions / problems with meds? YES  SUBJECTIVE; Telephone call to patient regarding EMMI stroke red alert. HIPAA verified with patient. Discussed EMMI stroke transition program with patient.  Patient states, " I must have answered the problem wrong because I have everything I need."  Patient reports he has all of his medication and can afford them. Patient states he has a new insurance now.  Patient denies any new symptoms. Patient states he occasionally feels  A little disoriented. Patient states he his not having anymore headaches and is not in any pain. Patient states he has a follow up appointment scheduled with his endocrinologist and follow up appointment for a wound check for his loop recorder. Patient denies any signs/ symptoms of infection at wound site. Patient states he does not have an appointment with his primary Md. RNCM advised patient to schedule follow up appointment with primary md. Patient verbalized understanding.  Patient states he is wearing a patch for smoking. Patient states he is checking his blood sugars daily.  RNCM advised patient to take his medications as prescribed.  RNCM reviewed signs of stroke with patient and advised to call 911 for stroke like symptoms.  RNCM advised patient to keep his follow up appointments.  Patient denies having any additional needs. Refused follow up appointments with RNCM.   ASSESSMENT: EMMI stroke transition program.  Patient denies any further follow needs.   PLAN; RNCM will refer patient to case manager assistant to close due to refusal of services.  [name] RN,BSN,CCM [facility] Telephonic  [phone]

## 2016-02-21 ENCOUNTER — Encounter: Payer: Self-pay | Admitting: "Endocrinology

## 2016-02-21 ENCOUNTER — Ambulatory Visit (INDEPENDENT_AMBULATORY_CARE_PROVIDER_SITE_OTHER): Payer: BLUE CROSS/BLUE SHIELD | Admitting: "Endocrinology

## 2016-02-21 VITALS — BP 128/83 | HR 89 | Ht 69.0 in | Wt 162.0 lb

## 2016-02-21 DIAGNOSIS — E785 Hyperlipidemia, unspecified: Secondary | ICD-10-CM

## 2016-02-21 DIAGNOSIS — I10 Essential (primary) hypertension: Secondary | ICD-10-CM | POA: Diagnosis not present

## 2016-02-21 DIAGNOSIS — Z91199 Patient's noncompliance with other medical treatment and regimen due to unspecified reason: Secondary | ICD-10-CM

## 2016-02-21 DIAGNOSIS — Z9119 Patient's noncompliance with other medical treatment and regimen: Secondary | ICD-10-CM | POA: Diagnosis not present

## 2016-02-21 DIAGNOSIS — E1159 Type 2 diabetes mellitus with other circulatory complications: Secondary | ICD-10-CM

## 2016-02-21 MED ORDER — GEMFIBROZIL 600 MG PO TABS: 600.0000 mg | ORAL_TABLET | Freq: Two times a day (BID) | ORAL | 3 refills | Status: DC

## 2016-02-21 MED ORDER — INSULIN DEGLUDEC 100 UNIT/ML ~~LOC~~ SOPN: 50.0000 [IU] | PEN_INJECTOR | Freq: Every day | SUBCUTANEOUS | 2 refills | Status: DC

## 2016-02-21 MED ORDER — INSULIN ASPART 100 UNIT/ML FLEXPEN: 10.0000 [IU] | PEN_INJECTOR | Freq: Three times a day (TID) | SUBCUTANEOUS | 2 refills | Status: DC

## 2016-02-21 NOTE — Progress Notes (Signed)
Subjective:    Patient ID: Nicholas [name], male    DOB: May 15, 1960,    Past Medical History:  Diagnosis Date  . Anal lesion    Anal papilla/tags - external lesion  . Colon polyp    Hyperplastic rectosigmoid polyp 9/08  . Coronary atherosclerosis of native coronary artery    DES to PLB, BMS to mid and distal RCA, DES to circumflex and distal LAD, 6/10 (5 stents)  . GERD (gastroesophageal reflux disease)   . Helicobacter pylori (H. pylori) 09/2009   Treated with helidac  . Hiatal hernia   . Hyperplastic colon polyp 2008  . Mixed hyperlipidemia   . Myocardial infarction 06/2008  . Type 2 diabetes mellitus ([place])    Past Surgical History:  Procedure Laterality Date  . CHOLECYSTECTOMY    . COLLAPSED LUNG     15 years ago  . COLONOSCOPY  10/12/2006   Dr. Gala Romney- rectosigmoid polyp s/phot snare polypectomy o/w normal rectum and terminal ileum. hyperplastic polyp on bx  . EP IMPLANTABLE DEVICE N/A 02/12/2016   Procedure: Loop Recorder Insertion;  Surgeon: Thompson Grayer, MD;  Location: [place] [facility];  Service: Cardiovascular;  Laterality: N/A;  . ESOPHAGOGASTRODUODENOSCOPY  10/12/2006   Dr. Gala Romney- examination of the tubular esophagus revealed no mucosal abnormalities. the EG junction was easily traversed. small hiatal hernia, the gastric mucosa o/w appeared normal. there was no infiltrating process or frank ulcer seen.  . TEE WITHOUT CARDIOVERSION N/A 02/12/2016   Procedure: TRANSESOPHAGEAL ECHOCARDIOGRAM (TEE);  Surgeon: Jerline Pain, MD;  Location: [facility] ENDOSCOPY;  Service: Cardiovascular;  Laterality: N/A;  . WIRE IN APEX OF RIGHT LUNG     Since childhood   Social History   Social History  . Marital status: Married    Spouse name: N/A  . Number of children: N/A  . Years of education: N/A   Occupational History  . maintenance, parking lots Self-Employed   Social History Main Topics  . Smoking status: Heavy Tobacco Smoker    Packs/day: 3.00    Years: 34.00    Types:  Cigarettes  . Smokeless tobacco: Never Used  . Alcohol use No  . Drug use: No  . Sexual activity: Yes    Partners: Female    Birth control/ protection: Pill   Other Topics Concern  . None   Social History Narrative  . None   Outpatient Encounter Prescriptions as of 02/21/2016  Medication Sig  . acetaminophen (TYLENOL) 500 MG tablet Take 1,000 mg by mouth every 6 (six) hours as needed.  Marland Kitchen aspirin 325 MG EC tablet Take 325 mg by mouth daily.    Marland Kitchen atorvastatin (LIPITOR) 40 MG tablet Take 1 tablet (40 mg total) by mouth daily at 6 PM.  . clopidogrel (PLAVIX) 75 MG tablet TAKE ONE TABLET BY MOUTH ONCE DAILY.  Marland Kitchen gemfibrozil (LOPID) 600 MG tablet Take 1 tablet (600 mg total) by mouth 2 (two) times daily before a meal.  . glucose blood (BAYER CONTOUR TEST) test strip Use as instructed 4 x daily E11.65. Contour plus test strips  . insulin aspart (NOVOLOG FLEXPEN) 100 UNIT/ML FlexPen Inject 10-16 Units into the skin 3 (three) times daily with meals.  . insulin degludec (TRESIBA) 100 UNIT/ML SOPN FlexTouch Pen Inject 0.5 mLs (50 Units total) into the skin daily at 10 pm.  . nicotine (NICODERM CQ - DOSED IN MG/24 HOURS) 21 mg/24hr patch Place 1 patch (21 mg total) onto the skin daily.  Marland Kitchen NITROSTAT 0.4 MG  SL tablet DISSOLVE 1 TABLET UNDER TONGUE EVERY 5 MINUTES UP TO 15 MIN FOR CHESTPAIN. IF NO RELIEF CALL 911.  . pantoprazole (PROTONIX) 40 MG tablet Take 1 tablet by mouth daily.   . traMADol (ULTRAM) 50 MG tablet Take 1 tablet (50 mg total) by mouth every 6 (six) hours as needed for moderate pain.  . [DISCONTINUED] insulin degludec (TRESIBA) 100 UNIT/ML SOPN FlexTouch Pen Inject 0.5 mLs (50 Units total) into the skin daily at 10 pm.   No facility-administered encounter medications on file as of 02/21/2016.    ALLERGIES: No Known Allergies VACCINATION STATUS:  There is no immunization history on file for this patient.  Diabetes  He presents for his follow-up diabetic visit. He has type 2  diabetes mellitus. Onset time: He was diagnosed at approximate age of 74 years. In this particular patient with a history of heavy alcohol use possiblity of pancreatic diabetes is high. His disease course has been worsening. There are no hypoglycemic associated symptoms. Pertinent negatives for hypoglycemia include no confusion, headaches, pallor or seizures. Pertinent negatives for diabetes include no chest pain, no fatigue, no polydipsia, no polyphagia, no polyuria and no weakness. There are no hypoglycemic complications. Symptoms are worsening. Diabetic complications include a CVA and heart disease. Risk factors for coronary artery disease include diabetes mellitus, dyslipidemia, hypertension, male sex, sedentary lifestyle and tobacco exposure. Current diabetic treatment includes oral agent (dual therapy). He is compliant with treatment some of the time. His weight is decreasing steadily. He is following a generally unhealthy diet. When asked about meal planning, he reported none. He has not had a previous visit with a dietitian (he did not keep appointment.). He rarely participates in exercise. His overall blood glucose range is >200 mg/dl. (He missed his last appointment. He was hospitalized in the interim due to CVA. Patient reports that she stopped taking his medications including his insulin prior to the CVA. He was discharged on basal/bolus insulin. He brings in the log showing monitoring only once a day.) Eye exam is current.  Hyperlipidemia  This is a chronic problem. The current episode started more than 1 year ago. Pertinent negatives include no chest pain, myalgias or shortness of breath. Current antihyperlipidemic treatment includes statins.  Hypertension  This is a chronic problem. The current episode started more than 1 year ago. Pertinent negatives include no chest pain, headaches, neck pain, palpitations or shortness of breath. Past treatments include beta blockers. Hypertensive end-organ  damage includes CAD/MI and CVA.    Review of Systems  Constitutional: Negative for fatigue and unexpected weight change.  HENT: Negative for dental problem, mouth sores and trouble swallowing.   Eyes: Negative for visual disturbance.  Respiratory: Negative for cough, choking, chest tightness, shortness of breath and wheezing.   Cardiovascular: Negative for chest pain, palpitations and leg swelling.  Gastrointestinal: Negative for abdominal distention, abdominal pain, constipation, diarrhea, nausea and vomiting.  Endocrine: Negative for polydipsia, polyphagia and polyuria.  Genitourinary: Negative for dysuria, flank pain, hematuria and urgency.  Musculoskeletal: Negative for back pain, gait problem, myalgias and neck pain.  Skin: Negative for pallor, rash and wound.  Neurological: Negative for seizures, syncope, weakness, numbness and headaches.  Psychiatric/Behavioral: Negative.  Negative for confusion and dysphoric mood.    Objective:    BP 128/83   Pulse 89   Ht '5\' 9"'$  (1.753 m)   Wt 162 lb (73.5 kg)   BMI 23.92 kg/m   Wt Readings from Last 3 Encounters:  02/21/16 162 lb (73.5  kg)  02/12/16 165 lb (74.8 kg)  10/11/15 168 lb (76.2 kg)    Physical Exam  Constitutional: He is oriented to person, place, and time. He appears well-developed and well-nourished. He is cooperative. No distress.  HENT:  Head: Normocephalic and atraumatic.  Eyes: EOM are normal.  Neck: Normal range of motion. Neck supple. No tracheal deviation present. No thyromegaly present.  Cardiovascular: Normal rate, S1 normal, S2 normal and normal heart sounds.  Exam reveals no gallop.   No murmur heard. Pulses:      Dorsalis pedis pulses are 1+ on the right side, and 1+ on the left side.       Posterior tibial pulses are 1+ on the right side, and 1+ on the left side.  Pulmonary/Chest: Breath sounds normal. No respiratory distress. He has no wheezes.  Abdominal: Soft. Bowel sounds are normal. He exhibits no  distension. There is no tenderness. There is no guarding and no CVA tenderness.  Musculoskeletal: He exhibits no edema.       Right shoulder: He exhibits no swelling and no deformity.  Neurological: He is alert and oriented to person, place, and time. He has normal strength and normal reflexes. No cranial nerve deficit or sensory deficit. Gait normal.  Skin: Skin is warm and dry. No rash noted. No cyanosis. Nails show no clubbing.  Psychiatric: He has a normal mood and affect. His speech is normal and behavior is normal. Judgment and thought content normal. Cognition and memory are normal.    Results for orders placed or performed during the hospital encounter of 02/09/16  Ethanol  Result Value Ref Range   Alcohol, Ethyl (B) <5 <5 mg/dL  Protime-INR  Result Value Ref Range   Prothrombin Time 12.7 11.4 - 15.2 seconds   INR 0.95   APTT  Result Value Ref Range   aPTT 28 24 - 36 seconds  CBC  Result Value Ref Range   WBC 9.8 4.0 - 10.5 K/uL   RBC 5.69 4.22 - 5.81 MIL/uL   Hemoglobin 15.9 13.0 - 17.0 g/dL   HCT 46.2 39.0 - 52.0 %   MCV 81.2 78.0 - 100.0 fL   MCH 27.9 26.0 - 34.0 pg   MCHC 34.4 30.0 - 36.0 g/dL   RDW 13.6 11.5 - 15.5 %   Platelets 155 150 - 400 K/uL  Differential  Result Value Ref Range   Neutrophils Relative % 57 %   Neutro Abs 5.4 1.7 - 7.7 K/uL   Lymphocytes Relative 35 %   Lymphs Abs 3.5 0.7 - 4.0 K/uL   Monocytes Relative 7 %   Monocytes Absolute 0.7 0.1 - 1.0 K/uL   Eosinophils Relative 1 %   Eosinophils Absolute 0.1 0.0 - 0.7 K/uL   Basophils Relative 0 %   Basophils Absolute 0.0 0.0 - 0.1 K/uL  Comprehensive metabolic panel  Result Value Ref Range   Sodium 137 135 - 145 mmol/L   Potassium 3.8 3.5 - 5.1 mmol/L   Chloride 100 (L) 101 - 111 mmol/L   CO2 27 22 - 32 mmol/L   Glucose, Bld 260 (H) 65 - 99 mg/dL   BUN 11 6 - 20 mg/dL   Creatinine, Ser 0.73 0.61 - 1.24 mg/dL   Calcium 9.6 8.9 - 10.3 mg/dL   Total Protein 7.1 6.5 - 8.1 g/dL   Albumin 4.1  3.5 - 5.0 g/dL   AST 16 15 - 41 U/L   ALT 21 17 - 63 U/L   Alkaline Phosphatase  75 38 - 126 U/L   Total Bilirubin 0.5 0.3 - 1.2 mg/dL   GFR calc non Af Amer >60 >60 mL/min   GFR calc Af Amer >60 >60 mL/min   Anion gap 10 5 - 15  Urine rapid drug screen (hosp performed)not at [facility]  Result Value Ref Range   Opiates NONE DETECTED NONE DETECTED   Cocaine NONE DETECTED NONE DETECTED   Benzodiazepines NONE DETECTED NONE DETECTED   Amphetamines NONE DETECTED NONE DETECTED   Tetrahydrocannabinol NONE DETECTED NONE DETECTED   Barbiturates NONE DETECTED NONE DETECTED  Urinalysis, Routine w reflex microscopic  Result Value Ref Range   Color, Urine YELLOW YELLOW   APPearance CLEAR CLEAR   Specific Gravity, Urine 1.016 1.005 - 1.030   pH 6.0 5.0 - 8.0   Glucose, UA >=500 (A) NEGATIVE mg/dL   Hgb urine dipstick NEGATIVE NEGATIVE   Bilirubin Urine NEGATIVE NEGATIVE   Ketones, ur 5 (A) NEGATIVE mg/dL   Protein, ur NEGATIVE NEGATIVE mg/dL   Nitrite NEGATIVE NEGATIVE   Leukocytes, UA NEGATIVE NEGATIVE   RBC / HPF 0-5 0 - 5 RBC/hpf   WBC, UA 0-5 0 - 5 WBC/hpf   Bacteria, UA NONE SEEN NONE SEEN  Troponin I  Result Value Ref Range   Troponin I <0.03 <0.03 ng/mL  Troponin I  Result Value Ref Range   Troponin I <0.03 <0.03 ng/mL  Troponin I  Result Value Ref Range   Troponin I <0.03 <0.03 ng/mL  Lipid panel  Result Value Ref Range   Cholesterol 212 (H) 0 - 200 mg/dL   Triglycerides 347 (H) <150 mg/dL   HDL 24 (L) >40 mg/dL   Total CHOL/HDL Ratio 8.8 RATIO   VLDL 69 (H) 0 - 40 mg/dL   LDL Cholesterol 119 (H) 0 - 99 mg/dL  Glucose, capillary  Result Value Ref Range   Glucose-Capillary 246 (H) 65 - 99 mg/dL   Comment 1 Notify RN    Comment 2 Document in Chart   Glucose, capillary  Result Value Ref Range   Glucose-Capillary 236 (H) 65 - 99 mg/dL  Hemoglobin A1c  Result Value Ref Range   Hgb A1c MFr Bld 11.3 (H) 4.8 - 5.6 %   Mean Plasma Glucose 278 mg/dL  Glucose, capillary  Result  Value Ref Range   Glucose-Capillary 254 (H) 65 - 99 mg/dL  Sedimentation rate  Result Value Ref Range   Sed Rate 6 0 - 16 mm/hr  Glucose, capillary  Result Value Ref Range   Glucose-Capillary 186 (H) 65 - 99 mg/dL  Lupus anticoagulant panel  Result Value Ref Range   PTT Lupus Anticoagulant 35.4 0.0 - 51.9 sec   DRVVT 37.7 0.0 - 47.0 sec   Lupus Anticoag Interp Comment:   Beta-2-glycoprotein i abs, IgG/M/A  Result Value Ref Range   Beta-2 Glyco I IgG <9 0 - 20 GPI IgG units   Beta-2-Glycoprotein I IgM <9 0 - 32 GPI IgM units   Beta-2-Glycoprotein I IgA <9 0 - 25 GPI IgA units  Homocysteine, serum  Result Value Ref Range   Homocysteine 10.8 0.0 - 15.0 umol/L  Cardiolipin antibodies, IgG, IgM, IgA  Result Value Ref Range   Anticardiolipin IgG <9 0 - 14 GPL U/mL   Anticardiolipin IgM 9 0 - 12 MPL U/mL   Anticardiolipin IgA <9 0 - 11 APL U/mL  Alpha galactosidase  Result Value Ref Range   Alpha galactosidase, serum 52.3 28.0 - 80.0 nmol/hr/mg prt   Interpretation Comment  Director Review Comment    Methodology Comment   C-reactive protein  Result Value Ref Range   CRP 2.1 (H) <1.0 mg/dL  Sedimentation rate  Result Value Ref Range   Sed Rate 9 0 - 16 mm/hr  ANA, IFA (with reflex)  Result Value Ref Range   ANA Ab, IFA Negative   Anti-DNA antibody, double-stranded  Result Value Ref Range   ds DNA Ab <1 0 - 9 IU/mL  TSH  Result Value Ref Range   TSH 0.904 0.350 - 4.500 uIU/mL  Vitamin B12  Result Value Ref Range   Vitamin B-12 404 180 - 914 pg/mL  RPR  Result Value Ref Range   RPR Ser Ql Non Reactive Non Reactive  HIV antibody  Result Value Ref Range   HIV Screen 4th Generation wRfx Non Reactive Non Reactive  Glucose, capillary  Result Value Ref Range   Glucose-Capillary 275 (H) 65 - 99 mg/dL   Comment 1 Notify RN    Comment 2 Document in Chart   Glucose, capillary  Result Value Ref Range   Glucose-Capillary 220 (H) 65 - 99 mg/dL   Comment 1 Notify RN     Comment 2 Document in Chart   Glucose, capillary  Result Value Ref Range   Glucose-Capillary 234 (H) 65 - 99 mg/dL   Comment 1 Notify RN    Comment 2 Document in Chart   Glucose, capillary  Result Value Ref Range   Glucose-Capillary 230 (H) 65 - 99 mg/dL  Glucose, capillary  Result Value Ref Range   Glucose-Capillary 243 (H) 65 - 99 mg/dL   Comment 1 Notify RN    Comment 2 Document in Chart   Glucose, capillary  Result Value Ref Range   Glucose-Capillary 274 (H) 65 - 99 mg/dL   Comment 1 Notify RN    Comment 2 Document in Chart   Glucose, capillary  Result Value Ref Range   Glucose-Capillary 257 (H) 65 - 99 mg/dL   Comment 1 Notify RN    Comment 2 Document in Chart   CBG monitoring, ED  Result Value Ref Range   Glucose-Capillary 265 (H) 65 - 99 mg/dL  I-Stat Chem 8, ED  (not at [facility], [facility])  Result Value Ref Range   Sodium 137 135 - 145 mmol/L   Potassium 3.9 3.5 - 5.1 mmol/L   Chloride 99 (L) 101 - 111 mmol/L   BUN 10 6 - 20 mg/dL   Creatinine, Ser 0.70 0.61 - 1.24 mg/dL   Glucose, Bld 249 (H) 65 - 99 mg/dL   Calcium, Ion 1.16 1.15 - 1.40 mmol/L   TCO2 27 0 - 100 mmol/L   Hemoglobin 16.3 13.0 - 17.0 g/dL   HCT 48.0 39.0 - 52.0 %  I-stat troponin, ED (not at [facility], [facility])  Result Value Ref Range   Troponin i, poc 0.02 0.00 - 0.08 ng/mL   Comment 3          Echocardiogram  Result Value Ref Range   Weight 2,440.93 oz   Height 68 in   BP 99/58 mmHg   Diabetic Labs (most recent): Lab Results  Component Value Date   HGBA1C 11.3 (H) 02/10/2016   HGBA1C 9.1 (H) 10/10/2015   HGBA1C 9.1 10/10/2015   Lipid Panel     Component Value Date/Time   CHOL 212 (H) 02/10/2016 0554   TRIG 347 (H) 02/10/2016 0554   HDL 24 (L) 02/10/2016 0554   CHOLHDL 8.8 02/10/2016 0554   VLDL 69 (  H) 02/10/2016 0554   LDLCALC 119 (H) 02/10/2016 0554      Assessment & Plan:   1. Type 2 diabetes mellitus with vascular disease (HCC) -His diabetes is  complicated by coronary artery disease  and patient remains at a high risk for more acute and chronic complications of diabetes which include CAD, CVA, CKD, retinopathy, and neuropathy. These are all discussed in detail with the patient.  -- This patient has been alarmingly noncompliant. For unclear reasons he discontinued his medications, missed his appointments, got hospitalized for stroke, his A1c is higher at 11.3% from 9.1% last visit.  - I have re-counseled the patient on diet management  by adopting a carbohydrate restricted / protein rich  Diet.  - Suggestion is made for patient to avoid simple carbohydrates   from their diet including Cakes , Desserts, Ice Cream,  Soda (  diet and regular) , Sweet Tea , Candies,  Chips, Cookies, Artificial Sweeteners,   and "Sugar-free" Products .  This will help patient to have stable blood glucose profile and potentially avoid unintended  Weight gain.  - Patient is advised to stick to a routine mealtimes to eat 3 meals  a day and avoid unnecessary snacks ( to snack only to correct hypoglycemia).  - The patient  will be  scheduled with Jearld Fenton, RDN, CDE for individualized DM education.  - I have approached patient with the following individualized plan to manage diabetes and patient agrees.  - I asked him to continue  Tresiba 50 units at bedtime , resume NovoLog  10 units 3 times a day before meals for pre-meal blood glucose above 90 mg/dL, associated with strict monitoring of blood glucose before meals and at bedtime.  -He is encouraged to call clinic for blood glucose levels less than 70 or above 200 mg /dl. - He is not the right candidate for Invokana due to weight loss, unintentional. -He lost approximately 56 pounds over 5 years. - Proper use of insulin is exclusive choices he has to treat his diabetes.  -Patient  is not a candidate for  incretin therapy. -Target numbers for A1c, LDL, HDL, Triglycerides, Waist Circumference were discussed in detail.   2) BP/HTN: Controlled.  Continue current medications including beta blockers. 3) Lipids/HPL:  continue statins. 4)  Weight/Diet:   he missed his CDE consult, exercise, and carbohydrates information provided.  5) Chronic Care/Health Maintenance:  -Patient is  Statin medications and encouraged to continue to follow up with Ophthalmology, Podiatrist at least yearly or according to recommendations, and advised to to stay away from  Smoking (he says he stopped smoking since he was diagnosed with a stroke). I have recommended yearly flu vaccine and pneumonia vaccination at least every 5 years; moderate intensity exercise for up to 150 minutes weekly; and  sleep for at least 7 hours a day.  I advised patient to maintain close follow up with their PCP for primary care needs.  Patient is asked to bring meter and  blood glucose logs during their next visit.   Follow up plan: Return in about 1 week (around 02/28/2016) for follow up with meter and logs- no labs.  Glade Lloyd, MD Phone: [phone]  Fax: [phone]   02/21/2016, 11:40 AM

## 2016-02-25 ENCOUNTER — Other Ambulatory Visit: Payer: Self-pay

## 2016-02-25 ENCOUNTER — Ambulatory Visit (INDEPENDENT_AMBULATORY_CARE_PROVIDER_SITE_OTHER): Payer: BLUE CROSS/BLUE SHIELD | Admitting: *Deleted

## 2016-02-25 ENCOUNTER — Telehealth: Payer: Self-pay | Admitting: "Endocrinology

## 2016-02-25 DIAGNOSIS — Z95818 Presence of other cardiac implants and grafts: Secondary | ICD-10-CM

## 2016-02-25 NOTE — Patient Outreach (Signed)
Patient triggered RED on EMMI Stroke dashboard, notification sent to:  [name], RN

## 2016-02-25 NOTE — Telephone Encounter (Signed)
Dr. Liliane Channel new medicine he sent in is a drug reaction to his Lipitor. The wife didn't remember the drugs name but they want to know if they should stop taking lipitor and take the new medicine? They think it was a medicine for cholesterol?

## 2016-02-25 NOTE — Patient Outreach (Signed)
[facility]) Care Management  02/25/2016  Nicholas [name] [birth year] [id number]  REFERRAL DATE: 02/18/16 REFERRAL SOURCE; EMMI stroke transition program REFERRAL REASON; EMMI stroke red alert:  Lost interest in things they use to enjoy:  YES  Telephone call to patient regarding EMMI stroke red alert. Unable to reach patient. HIPAA compliant voice message left with call back phone number.   PLAN: RNCM will attempt 2nd telephone call to patient within 1 week.   [name] RN,BSN,CCM [facility] Telephonic  [phone]

## 2016-02-25 NOTE — Telephone Encounter (Signed)
Form from pharmacy on Dr Emi Holes desk to verify if pt is supposed to be on both cholesterol medications

## 2016-02-26 LAB — CUP PACEART INCLINIC DEVICE CHECK
Date Time Interrogation Session: 20180129215337
MDC IDC PG IMPLANT DT: 20180116

## 2016-02-26 NOTE — Progress Notes (Signed)
Loop wound check in clinic. Steri-strips previously removed. Wound well healed without redness, swelling or drainage. Battery status: good. R-waves 0.50m. No episodes. Patient educated about wound care and Carelink monitoring. Monthly summary reports and ROV with JA PRN.

## 2016-02-27 ENCOUNTER — Other Ambulatory Visit: Payer: Self-pay

## 2016-02-27 NOTE — Patient Outreach (Signed)
[facility]) Care Management  02/27/2016  Nicholas [name] 01/21/1961 [id number]  REFERRAL DATE: 02/18/16 REFERRAL SOURCE; EMMI stroke transition program REFERRAL REASON; EMMI stroke red alert: Questions/ Problems with medications:  YES Went to follow up appointment: NO  SUBJECTIVE; Telephone call to patient regarding EMMI stroke RED alert. HIPAA verified with patient. Patient states he is not having any problems with his medications. Patient states he has all of his medicines. Patient states he has not seen his primary MD yet. Patient states he has a follow up appointment scheduled for next week. Patient denies any other concerns.  Patient denies any new onset of symptoms.  RNCM reviewed signs of stroke and advised patient to call 911 for stroke like symptoms.  Patient denies needed any additional follow up at this time.   ASSESSMENT: EMMI stroke transition program follow up for RED alert.  PLAN: RNCM will refer patient to case management assistant to close due to refusal of services.   Nicholas Plowman RN,BSN,CCM [facility] Telephonic  [phone]

## 2016-02-29 ENCOUNTER — Encounter: Payer: Self-pay | Admitting: "Endocrinology

## 2016-02-29 ENCOUNTER — Ambulatory Visit (INDEPENDENT_AMBULATORY_CARE_PROVIDER_SITE_OTHER): Payer: BLUE CROSS/BLUE SHIELD | Admitting: "Endocrinology

## 2016-02-29 VITALS — BP 113/74 | HR 98 | Ht 69.0 in | Wt 168.0 lb

## 2016-02-29 DIAGNOSIS — Z91199 Patient's noncompliance with other medical treatment and regimen due to unspecified reason: Secondary | ICD-10-CM

## 2016-02-29 DIAGNOSIS — E1159 Type 2 diabetes mellitus with other circulatory complications: Secondary | ICD-10-CM

## 2016-02-29 DIAGNOSIS — I10 Essential (primary) hypertension: Secondary | ICD-10-CM

## 2016-02-29 DIAGNOSIS — E785 Hyperlipidemia, unspecified: Secondary | ICD-10-CM

## 2016-02-29 DIAGNOSIS — Z9119 Patient's noncompliance with other medical treatment and regimen: Secondary | ICD-10-CM | POA: Diagnosis not present

## 2016-02-29 MED ORDER — INSULIN ASPART 100 UNIT/ML FLEXPEN: 12.0000 [IU] | PEN_INJECTOR | Freq: Three times a day (TID) | SUBCUTANEOUS | 2 refills | Status: DC

## 2016-02-29 NOTE — Progress Notes (Signed)
Subjective:    Patient ID: Nicholas [name], male    DOB: 1960-12-05,    Past Medical History:  Diagnosis Date  . Anal lesion    Anal papilla/tags - external lesion  . Colon polyp    Hyperplastic rectosigmoid polyp 9/08  . Coronary atherosclerosis of native coronary artery    DES to PLB, BMS to mid and distal RCA, DES to circumflex and distal LAD, 6/10 (5 stents)  . GERD (gastroesophageal reflux disease)   . Helicobacter pylori (H. pylori) 09/2009   Treated with helidac  . Hiatal hernia   . Hyperplastic colon polyp 2008  . Mixed hyperlipidemia   . Myocardial infarction 06/2008  . Type 2 diabetes mellitus ([place])    Past Surgical History:  Procedure Laterality Date  . CHOLECYSTECTOMY    . COLLAPSED LUNG     15 years ago  . COLONOSCOPY  10/12/2006   Dr. Gala Romney- rectosigmoid polyp s/phot snare polypectomy o/w normal rectum and terminal ileum. hyperplastic polyp on bx  . EP IMPLANTABLE DEVICE N/A 02/12/2016   Procedure: Loop Recorder Insertion;  Surgeon: Thompson Grayer, MD;  Location: [facility];  Service: Cardiovascular;  Laterality: N/A;  . ESOPHAGOGASTRODUODENOSCOPY  10/12/2006   Dr. Gala Romney- examination of the tubular esophagus revealed no mucosal abnormalities. the EG junction was easily traversed. small hiatal hernia, the gastric mucosa o/w appeared normal. there was no infiltrating process or frank ulcer seen.  . TEE WITHOUT CARDIOVERSION N/A 02/12/2016   Procedure: TRANSESOPHAGEAL ECHOCARDIOGRAM (TEE);  Surgeon: Jerline Pain, MD;  Location: [facility] ENDOSCOPY;  Service: Cardiovascular;  Laterality: N/A;  . WIRE IN APEX OF RIGHT LUNG     Since childhood   Social History   Social History  . Marital status: Married    Spouse name: N/A  . Number of children: N/A  . Years of education: N/A   Occupational History  . maintenance, parking lots Self-Employed   Social History Main Topics  . Smoking status: Heavy Tobacco Smoker    Packs/day: 3.00    Years: 34.00    Types:  Cigarettes  . Smokeless tobacco: Never Used  . Alcohol use No  . Drug use: No  . Sexual activity: Yes    Partners: Female    Birth control/ protection: Pill   Other Topics Concern  . None   Social History Narrative  . None   Outpatient Encounter Prescriptions as of 02/29/2016  Medication Sig  . acetaminophen (TYLENOL) 500 MG tablet Take 1,000 mg by mouth every 6 (six) hours as needed.  Marland Kitchen aspirin 325 MG EC tablet Take 325 mg by mouth daily.    Marland Kitchen atorvastatin (LIPITOR) 40 MG tablet Take 1 tablet (40 mg total) by mouth daily at 6 PM.  . clopidogrel (PLAVIX) 75 MG tablet TAKE ONE TABLET BY MOUTH ONCE DAILY.  Marland Kitchen gemfibrozil (LOPID) 600 MG tablet Take 1 tablet (600 mg total) by mouth 2 (two) times daily before a meal.  . glucose blood (BAYER CONTOUR TEST) test strip Use as instructed 4 x daily E11.65. Contour plus test strips  . insulin aspart (NOVOLOG FLEXPEN) 100 UNIT/ML FlexPen Inject 12-18 Units into the skin 3 (three) times daily with meals.  . insulin degludec (TRESIBA) 100 UNIT/ML SOPN FlexTouch Pen Inject 0.5 mLs (50 Units total) into the skin daily at 10 pm.  . nicotine (NICODERM CQ - DOSED IN MG/24 HOURS) 21 mg/24hr patch Place 1 patch (21 mg total) onto the skin daily.  Marland Kitchen NITROSTAT 0.4 MG  SL tablet DISSOLVE 1 TABLET UNDER TONGUE EVERY 5 MINUTES UP TO 15 MIN FOR CHESTPAIN. IF NO RELIEF CALL 911.  . pantoprazole (PROTONIX) 40 MG tablet Take 1 tablet by mouth daily.   . traMADol (ULTRAM) 50 MG tablet Take 1 tablet (50 mg total) by mouth every 6 (six) hours as needed for moderate pain.  . [DISCONTINUED] insulin aspart (NOVOLOG FLEXPEN) 100 UNIT/ML FlexPen Inject 10-16 Units into the skin 3 (three) times daily with meals.   No facility-administered encounter medications on file as of 02/29/2016.    ALLERGIES: No Known Allergies VACCINATION STATUS:  There is no immunization history on file for this patient.  Diabetes  He presents for his follow-up diabetic visit. He has type 2  diabetes mellitus. Onset time: He was diagnosed at approximate age of 33 years. In this particular patient with a history of heavy alcohol use possiblity of pancreatic diabetes is high. His disease course has been improving. There are no hypoglycemic associated symptoms. Pertinent negatives for hypoglycemia include no confusion, headaches, pallor or seizures. Pertinent negatives for diabetes include no chest pain, no fatigue, no polydipsia, no polyphagia, no polyuria and no weakness. There are no hypoglycemic complications. Symptoms are improving. Diabetic complications include a CVA and heart disease. Risk factors for coronary artery disease include diabetes mellitus, dyslipidemia, hypertension, male sex, sedentary lifestyle and tobacco exposure. Current diabetic treatment includes oral agent (dual therapy). He is compliant with treatment some of the time. His weight is increasing steadily. He is following a generally unhealthy diet. When asked about meal planning, he reported none. He has not had a previous visit with a dietitian (he did not keep appointment.). He rarely participates in exercise. His breakfast blood glucose range is generally 140-180 mg/dl. His lunch blood glucose range is generally 140-180 mg/dl. His dinner blood glucose range is generally 180-200 mg/dl. His overall blood glucose range is 180-200 mg/dl. (He missed his last appointment. He was hospitalized in the interim due to CVA. Patient reports that she stopped taking his medications including his insulin prior to the CVA. He was discharged on basal/bolus insulin. He brings in the log showing monitoring only once a day.) Eye exam is current.  Hyperlipidemia  This is a chronic problem. The current episode started more than 1 year ago. Pertinent negatives include no chest pain, myalgias or shortness of breath. Current antihyperlipidemic treatment includes statins.  Hypertension  This is a chronic problem. The current episode started more  than 1 year ago. Pertinent negatives include no chest pain, headaches, neck pain, palpitations or shortness of breath. Past treatments include beta blockers. Hypertensive end-organ damage includes CAD/MI and CVA.    Review of Systems  Constitutional: Negative for fatigue and unexpected weight change.  HENT: Negative for dental problem, mouth sores and trouble swallowing.   Eyes: Negative for visual disturbance.  Respiratory: Negative for cough, choking, chest tightness, shortness of breath and wheezing.   Cardiovascular: Negative for chest pain, palpitations and leg swelling.  Gastrointestinal: Negative for abdominal distention, abdominal pain, constipation, diarrhea, nausea and vomiting.  Endocrine: Negative for polydipsia, polyphagia and polyuria.  Genitourinary: Negative for dysuria, flank pain, hematuria and urgency.  Musculoskeletal: Negative for back pain, gait problem, myalgias and neck pain.  Skin: Negative for pallor, rash and wound.  Neurological: Negative for seizures, syncope, weakness, numbness and headaches.  Psychiatric/Behavioral: Negative.  Negative for confusion and dysphoric mood.    Objective:    BP 113/74   Pulse 98   Ht '5\' 9"'$  (1.753 m)  Wt 168 lb (76.2 kg)   BMI 24.81 kg/m   Wt Readings from Last 3 Encounters:  02/29/16 168 lb (76.2 kg)  02/21/16 162 lb (73.5 kg)  02/12/16 165 lb (74.8 kg)    Physical Exam  Constitutional: He is oriented to person, place, and time. He appears well-developed and well-nourished. He is cooperative. No distress.  HENT:  Head: Normocephalic and atraumatic.  Eyes: EOM are normal.  Neck: Normal range of motion. Neck supple. No tracheal deviation present. No thyromegaly present.  Cardiovascular: Normal rate, S1 normal, S2 normal and normal heart sounds.  Exam reveals no gallop.   No murmur heard. Pulses:      Dorsalis pedis pulses are 1+ on the right side, and 1+ on the left side.       Posterior tibial pulses are 1+ on the  right side, and 1+ on the left side.  Pulmonary/Chest: Breath sounds normal. No respiratory distress. He has no wheezes.  Abdominal: Soft. Bowel sounds are normal. He exhibits no distension. There is no tenderness. There is no guarding and no CVA tenderness.  Musculoskeletal: He exhibits no edema.       Right shoulder: He exhibits no swelling and no deformity.  Neurological: He is alert and oriented to person, place, and time. He has normal strength and normal reflexes. No cranial nerve deficit or sensory deficit. Gait normal.  Skin: Skin is warm and dry. No rash noted. No cyanosis. Nails show no clubbing.  Psychiatric: He has a normal mood and affect. His speech is normal and behavior is normal. Judgment and thought content normal. Cognition and memory are normal.    Results for orders placed or performed in visit on 02/25/16  [facility]  Result Value Ref Range   Date Time Interrogation Session [phone]    Pulse Generator Manufacturer MERM    Pulse Gen Model [id number] Reveal LINQ    Pulse Gen Serial Number [id number]    Clinic Name [facility]    Implantable Pulse Generator Type ICM/ILR    Implantable Pulse Generator Implant Date [id number]    Battery Status OK    Eval Rhythm SR 88bpm    Diabetic Labs (most recent): Lab Results  Component Value Date   HGBA1C 11.3 (H) 02/10/2016   HGBA1C 9.1 (H) 10/10/2015   HGBA1C 9.1 10/10/2015   Lipid Panel     Component Value Date/Time   CHOL 212 (H) 02/10/2016 0554   TRIG 347 (H) 02/10/2016 0554   HDL 24 (L) 02/10/2016 0554   CHOLHDL 8.8 02/10/2016 0554   VLDL 69 (H) 02/10/2016 0554   LDLCALC 119 (H) 02/10/2016 0554      Assessment & Plan:   1. Type 2 diabetes mellitus with vascular disease (HCC) -His diabetes is  complicated by coronary artery disease and patient remains at a high risk for more acute and chronic complications of diabetes which include CAD, CVA, CKD, retinopathy, and neuropathy. These  are all discussed in detail with the patient.  -- This patient has been alarmingly noncompliant,  Until he got hospitalized for stroke prior to his last visit , his recent A1c has been  higher at 11.3% from 9.1%.  - I have re-counseled the patient on diet management  by adopting a carbohydrate restricted / protein rich  Diet.  - Suggestion is made for patient to avoid simple carbohydrates   from their diet including Cakes , Desserts, Ice Cream,  Soda (  diet and regular) , Sweet Tea ,  Candies,  Chips, Cookies, Artificial Sweeteners,   and "Sugar-free" Products .  This will help patient to have stable blood glucose profile and potentially avoid unintended  Weight gain.  - Patient is advised to stick to a routine mealtimes to eat 3 meals  a day and avoid unnecessary snacks ( to snack only to correct hypoglycemia).  - The patient  will be  scheduled with Nicholas [name], Nicholas [name], Nicholas [name] for individualized DM education.  - I have approached patient with the following individualized plan to manage diabetes and patient agrees.  - I asked him to continue  Tresiba 50 units at bedtime ,  increase NovoLog  To 12 units 3 times a day before meals for pre-meal blood glucose above 90 mg/dL, associated with strict monitoring of blood glucose before meals and at bedtime.  -He is encouraged to call clinic for blood glucose levels less than 70 or above 200 mg /dl. - He is not the right candidate for Invokana due to weight loss, unintentional. -He is regaining some of his weight after he lost approximately 56 pounds over 5 years. - Proper use of insulin is the  exclusive choice he has to treat his diabetes.  -I gave him brochure and info on continuous glucose monitoring device. -Patient  is not a candidate for  incretin therapy. -Target numbers for A1c, LDL, HDL, Triglycerides, Waist Circumference were discussed in detail.   2) BP/HTN: Controlled. Continue current medications including beta blockers. 3) Lipids/HPL:   continue statins. 4)  Weight/Diet:   he missed his Nicholas [name] consult, exercise, and carbohydrates information provided.  5) Chronic Care/Health Maintenance:  -Patient is  Statin medications and encouraged to continue to follow up with Ophthalmology, Podiatrist at least yearly or according to recommendations, and advised to to stay away from  Smoking (he says he stopped smoking since he was diagnosed with a stroke). I have recommended yearly flu vaccine and pneumonia vaccination at least every 5 years; moderate intensity exercise for up to 150 minutes weekly; and  sleep for at least 7 hours a day.  I advised patient to maintain close follow up with their PCP for primary care needs.  Patient is asked to bring meter and  blood glucose logs during their next visit.   Follow up plan: Return in about 10 weeks (around 05/09/2016) for follow up with pre-visit labs, meter, and logs.  Glade Lloyd, MD Phone: [phone]  Fax: [phone]   02/29/2016, 10:35 AM

## 2016-03-13 ENCOUNTER — Ambulatory Visit (INDEPENDENT_AMBULATORY_CARE_PROVIDER_SITE_OTHER): Payer: BLUE CROSS/BLUE SHIELD | Admitting: *Deleted

## 2016-03-13 DIAGNOSIS — I639 Cerebral infarction, unspecified: Secondary | ICD-10-CM | POA: Diagnosis not present

## 2016-03-13 NOTE — Progress Notes (Signed)
[facility]link Summary Report / Loop Recorder 

## 2016-04-07 LAB — CUP PACEART REMOTE DEVICE CHECK
Date Time Interrogation Session: 20180215133538
MDC IDC PG IMPLANT DT: 20180116

## 2016-04-14 ENCOUNTER — Ambulatory Visit (INDEPENDENT_AMBULATORY_CARE_PROVIDER_SITE_OTHER): Payer: BLUE CROSS/BLUE SHIELD | Admitting: *Deleted

## 2016-04-14 DIAGNOSIS — I639 Cerebral infarction, unspecified: Secondary | ICD-10-CM

## 2016-04-14 NOTE — Progress Notes (Signed)
[facility]link Summary Report / Loop Recorder 

## 2016-04-21 ENCOUNTER — Ambulatory Visit (INDEPENDENT_AMBULATORY_CARE_PROVIDER_SITE_OTHER): Payer: BLUE CROSS/BLUE SHIELD | Admitting: Nurse Practitioner

## 2016-04-21 ENCOUNTER — Encounter: Payer: Self-pay | Admitting: Nurse Practitioner

## 2016-04-21 VITALS — BP 130/77 | HR 86 | Ht 69.0 in | Wt 169.8 lb

## 2016-04-21 DIAGNOSIS — E785 Hyperlipidemia, unspecified: Secondary | ICD-10-CM

## 2016-04-21 DIAGNOSIS — I639 Cerebral infarction, unspecified: Secondary | ICD-10-CM | POA: Diagnosis not present

## 2016-04-21 DIAGNOSIS — F17200 Nicotine dependence, unspecified, uncomplicated: Secondary | ICD-10-CM | POA: Diagnosis not present

## 2016-04-21 DIAGNOSIS — I10 Essential (primary) hypertension: Secondary | ICD-10-CM | POA: Diagnosis not present

## 2016-04-21 NOTE — Patient Instructions (Signed)
Stressed the importance of management of risk factors to prevent further stroke Continue aspirin and Plavixfor secondary stroke prevention and cardiac prevention Maintain strict control of hypertension with blood pressure goal below 130/90, today's reading 130/77 Control of diabetes with hemoglobin A1c below 6.5 followed by primary care most recent hemoglobin A1c11.3 continue diabetic medications Cholesterol with LDL cholesterol less than 70, followed by primary care,  most recent 119 continue Lipitor Exercise by walking, slowly increase , eat healthy diet with whole grains,  fresh fruits and vegetables Discussed risk for recurrent stroke/ TIA and answered additional questions Follow up 3 months

## 2016-04-21 NOTE — Progress Notes (Signed)
[facility]  PATIENT: Nicholas [name] DOB: [birth year]   REASON FOR VISIT: Hospital follow-up for left occipital cortical and subcortical infarction HISTORY FROM: Patient    HISTORY OF PRESENT ILLNESS: [name], 56 year old male returns for hospital follow-up after admission 02/09/2016 for word finding difficulties which started the day before and he also noticed a headache and was progressing his headache was localized to the frontal region and described as a throbbing sensation. The headache was constant he had some nausea but no vomiting. There were no changes to his vision problems swallowing weakness or neck stiffness. Wife reported that he was confused the day before trying to obtain his blood sugar. 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 MRA of the extracranial circulation is concerning for a flare reduce lesion in the left vertebral origin of 75% 2-D echo with 55-60% ejection fraction. TEE no source of emboli. He has had a loop recorder placed and so far no arrhythmias LDL on admission 119 hemoglobin A1c 11.3 is a heavy smoker. History of coronary artery disease, MI and stents. Return visit today he remains on Plavix and aspirin for secondary stroke prevention and cardiac prevention. He has no bruising and no bleeding. He is on Lipitor 40 mg daily without complaints of myalgias. He has cut down on his smoking but has not completely stopped, S2 using half a pack a day. He claims he is doing better with his diabetes. Dr. Hilma Favors is his primary care. Blood pressure in the office today 130/77. He is self-employed He returns for reevaluation  .   REVIEW OF SYSTEMS: Full 14 system review of systems performed and notable only for those listed, all others are neg:  Constitutional:Fatigue  Cardiovascular: neg Ear/Nose/Throat: neg  Skin:  neg Eyes: neg Respiratory: neg Gastroitestinal: neg  Hematology/Lymphatic: neg  Endocrine: neg Musculoskeletal:neg Allergy/Immunology: neg Neurological: neg Psychiatric: neg Sleep : neg   ALLERGIES: No Known Allergies  HOME MEDICATIONS: Outpatient Medications Prior to Visit  Medication Sig Dispense Refill  . aspirin 325 MG EC tablet Take 325 mg by mouth daily.      Marland Kitchen atorvastatin (LIPITOR) 40 MG tablet Take 1 tablet (40 mg total) by mouth daily at 6 PM. 30 tablet 0  . clopidogrel (PLAVIX) 75 MG tablet TAKE ONE TABLET BY MOUTH ONCE DAILY. 30 tablet 11  . gemfibrozil (LOPID) 600 MG tablet Take 1 tablet (600 mg total) by mouth 2 (two) times daily before a meal. 60 tablet 3  . glucose blood (BAYER CONTOUR TEST) test strip Use as instructed 4 x daily E11.65. Contour plus test strips 150 each 5  . insulin aspart (NOVOLOG FLEXPEN) 100 UNIT/ML FlexPen Inject 12-18 Units into the skin 3 (three) times daily with meals. 15 mL 2  . insulin degludec (TRESIBA) 100 UNIT/ML SOPN FlexTouch Pen Inject 0.5 mLs (50 Units total) into the skin daily at 10 pm. 3 pen 2  . NITROSTAT 0.4 MG SL tablet DISSOLVE 1 TABLET UNDER TONGUE EVERY 5 MINUTES UP TO 15 MIN FOR CHESTPAIN. IF NO RELIEF CALL 911. 25 tablet 1  . pantoprazole (PROTONIX) 40 MG tablet Take 1 tablet by mouth daily.     . traMADol (ULTRAM) 50 MG tablet Take 1 tablet (50 mg total) by mouth every 6 (six) hours as needed for moderate pain. 15 tablet 0  . acetaminophen (TYLENOL) 500 MG tablet Take 1,000 mg  by mouth every 6 (six) hours as needed.    . nicotine (NICODERM CQ - DOSED IN MG/24 HOURS) 21 mg/24hr patch Place 1 patch (21 mg total) onto the skin daily. (Patient not taking: Reported on 04/21/2016) 14 patch 0   No facility-administered medications prior to visit.     PAST MEDICAL HISTORY: Past Medical History:  Diagnosis Date  . Anal lesion    Anal papilla/tags - external lesion  . Colon polyp    Hyperplastic rectosigmoid polyp 9/08  .  Coronary atherosclerosis of native coronary artery    DES to PLB, BMS to mid and distal RCA, DES to circumflex and distal LAD, 6/10 (5 stents)  . GERD (gastroesophageal reflux disease)   . Helicobacter pylori (H. pylori) 09/2009   Treated with helidac  . Hiatal hernia   . Hyperplastic colon polyp 2008  . Mixed hyperlipidemia   . Myocardial infarction 06/2008  . Type 2 diabetes mellitus ([place])     PAST SURGICAL HISTORY: Past Surgical History:  Procedure Laterality Date  . CHOLECYSTECTOMY    . COLLAPSED LUNG     15 years ago  . COLONOSCOPY  10/12/2006   Dr. Gala Romney- rectosigmoid polyp s/phot snare polypectomy o/w normal rectum and terminal ileum. hyperplastic polyp on bx  . EP IMPLANTABLE DEVICE N/A 02/12/2016   Procedure: Loop Recorder Insertion;  Surgeon: Thompson Grayer, MD;  Location: [facility];  Service: Cardiovascular;  Laterality: N/A;  . ESOPHAGOGASTRODUODENOSCOPY  10/12/2006   Dr. Gala Romney- examination of the tubular esophagus revealed no mucosal abnormalities. the EG junction was easily traversed. small hiatal hernia, the gastric mucosa o/w appeared normal. there was no infiltrating process or frank ulcer seen.  . TEE WITHOUT CARDIOVERSION N/A 02/12/2016   Procedure: TRANSESOPHAGEAL ECHOCARDIOGRAM (TEE);  Surgeon: Jerline Pain, MD;  Location: [facility] ENDOSCOPY;  Service: Cardiovascular;  Laterality: N/A;  . WIRE IN APEX OF RIGHT LUNG     Since childhood    FAMILY HISTORY: Family History  Problem Relation Age of Onset  . Diabetes Mother   . Heart disease Father   . Cancer Brother   . CVA Neg Hx     SOCIAL HISTORY: Social History   Social History  . Marital status: Married    Spouse name: N/A  . Number of children: N/A  . Years of education: N/A   Occupational History  . maintenance, parking lots Self-Employed   Social History Main Topics  . Smoking status: Current Every Day Smoker    Packs/day: 0.50    Years: 34.00    Types: Cigarettes  . Smokeless tobacco: Never  Used  . Alcohol use No  . Drug use: No  . Sexual activity: Yes    Partners: Female    Birth control/ protection: Pill   Other Topics Concern  . Not on file   Social History Narrative   Drinks Coffee 2 cups daily.  Lives sat home with wife.   Is self employed.  Graduated from High school.       PHYSICAL EXAM  Vitals:   04/21/16 0822  BP: 130/77  Pulse: 86  Weight: 169 lb 12.8 oz (77 kg)  Height: '5\' 9"'$  (1.753 m)   Body mass index is 25.08 kg/m.  Generalized: Well developed, in no acute distress  Head: normocephalic and atraumatic,. Oropharynx benign  Neck: Supple, no carotid bruits  Cardiac: Regular rate rhythm, no murmur  Musculoskeletal: No deformity   Neurological examination   Mentation: Alert oriented to time, place, history taking.  Attention span and concentration appropriate. Recent and remote memory intact.  Follows all commands speech and language fluent.   Cranial nerve II-XII: Fundoscopic exam reveals sharp disc margins.Pupils were equal round reactive to light extraocular movements were full, visual field were full on confrontational test. Facial sensation and strength were normal. hearing was intact to finger rubbing bilaterally. Uvula tongue midline. head turning and shoulder shrug were normal and symmetric.Tongue protrusion into cheek strength was normal. Motor: normal bulk and tone, full strength in the BUE, BLE, fine finger movements normal, no pronator drift. No focal weakness Sensory: normal and symmetric to light touch, pinprick, and  Vibration, In the upper and lower extremities Coordination: finger-nose-finger, heel-to-shin bilaterally, no dysmetria Reflexes: 1+ upper lower and symmetric, plantar responses were flexor bilaterally. Gait and Station: Rising up from seated position without assistance, normal stance,  moderate stride, good arm swing, smooth turning, able to perform tiptoe, and heel walking without difficulty. Tandem gait is  steady  DIAGNOSTIC DATA (LABS, IMAGING, TESTING) - I reviewed patient records, labs, notes, testing and imaging myself where available.  Lab Results  Component Value Date   WBC 9.8 02/09/2016   HGB 16.3 02/09/2016   HCT 48.0 02/09/2016   MCV 81.2 02/09/2016   PLT 155 02/09/2016      Component Value Date/Time   NA 137 02/09/2016 2006   K 3.9 02/09/2016 2006   CL 99 (L) 02/09/2016 2006   CO2 27 02/09/2016 1959   GLUCOSE 249 (H) 02/09/2016 2006   BUN 10 02/09/2016 2006   CREATININE 0.70 02/09/2016 2006   CREATININE 0.78 10/10/2015 1233   CALCIUM 9.6 02/09/2016 1959   PROT 7.1 02/09/2016 1959   ALBUMIN 4.1 02/09/2016 1959   AST 16 02/09/2016 1959   ALT 21 02/09/2016 1959   ALKPHOS 75 02/09/2016 1959   BILITOT 0.5 02/09/2016 1959   GFRNONAA >60 02/09/2016 1959   GFRNONAA >89 10/10/2015 1233   GFRAA >60 02/09/2016 1959   GFRAA >89 10/10/2015 1233   Lab Results  Component Value Date   CHOL 212 (H) 02/10/2016   HDL 24 (L) 02/10/2016   LDLCALC 119 (H) 02/10/2016   TRIG 347 (H) 02/10/2016   CHOLHDL 8.8 02/10/2016   Lab Results  Component Value Date   HGBA1C 11.3 (H) 02/10/2016   Lab Results  Component Value Date   VITAMINB12 404 02/11/2016   Lab Results  Component Value Date   TSH 0.904 02/11/2016      ASSESSMENT AND PLAN  56 y.o. year old male  here for hospital follow-up for left occipital cortical and subcortical infarct with stroke risk factors of CAD and MI status post stent uncontrolled diabetes and hyperlipidemia and smoking here to follow-up. Loop recorder has been placed but so far no arrhythmias or atrial fibrillation identified.  The patient is a current patient of Dr. Erlinda Hong  who is out of the office today . This note is sent to the work in doctor.     PLAN: Stressed the importance of management of risk factors to prevent further stroke Continue aspirin and Plavixfor secondary stroke prevention and cardiac prevention Maintain strict control of  hypertension with blood pressure goal below 130/90, today's reading 130/77 Control of diabetes with hemoglobin A1c below 6.5 followed by primary care most recent hemoglobin A1c11.3 continue diabetic medications Cholesterol with LDL cholesterol less than 70, followed by primary care,  most recent 119 continue Lipitor Exercise by walking, slowly increase , eat healthy diet with whole grains,  fresh fruits and  vegetables Discussed risk for recurrent stroke/ TIA and answered additional questions Follow up 3 months This was a visit requiring 30 minutes and medical decision making of high complexity with extensive review of history, hospital chart, counseling and answering questions Dennie Bible, [facility], [facility], APRN  [facility] [address] [place], [place] [address] [phone]

## 2016-04-25 LAB — CUP PACEART REMOTE DEVICE CHECK
Implantable Pulse Generator Implant Date: 20180116
MDC IDC SESS DTM: 20180317140528

## 2016-04-25 NOTE — Progress Notes (Signed)
Carelink summary report received. Battery status OK. Normal device function. No new symptom episodes, tachy episodes, brady, or pause episodes. No new AF episodes. Monthly summary reports and ROV/PRN 

## 2016-05-02 ENCOUNTER — Other Ambulatory Visit: Payer: Self-pay | Admitting: "Endocrinology

## 2016-05-02 LAB — COMPREHENSIVE METABOLIC PANEL
ALK PHOS: 70 U/L (ref 40–115)
ALT: 18 U/L (ref 9–46)
AST: 16 U/L (ref 10–35)
Albumin: 4.4 g/dL (ref 3.6–5.1)
BILIRUBIN TOTAL: 0.6 mg/dL (ref 0.2–1.2)
BUN: 15 mg/dL (ref 7–25)
CO2: 27 mmol/L (ref 20–31)
Calcium: 9.9 mg/dL (ref 8.6–10.3)
Chloride: 105 mmol/L (ref 98–110)
Creat: 0.74 mg/dL (ref 0.70–1.33)
Glucose, Bld: 164 mg/dL — ABNORMAL HIGH (ref 65–99)
POTASSIUM: 4.7 mmol/L (ref 3.5–5.3)
Sodium: 139 mmol/L (ref 135–146)
TOTAL PROTEIN: 7.2 g/dL (ref 6.1–8.1)

## 2016-05-03 LAB — HEMOGLOBIN A1C
HEMOGLOBIN A1C: 8.2 % — AB (ref <5.7)
Mean Plasma Glucose: 189 mg/dL

## 2016-05-09 ENCOUNTER — Ambulatory Visit (INDEPENDENT_AMBULATORY_CARE_PROVIDER_SITE_OTHER): Payer: BLUE CROSS/BLUE SHIELD | Admitting: "Endocrinology

## 2016-05-09 ENCOUNTER — Encounter: Payer: Self-pay | Admitting: "Endocrinology

## 2016-05-09 ENCOUNTER — Other Ambulatory Visit: Payer: Self-pay

## 2016-05-09 VITALS — BP 120/75 | HR 81 | Ht 69.0 in | Wt 167.0 lb

## 2016-05-09 DIAGNOSIS — Z91199 Patient's noncompliance with other medical treatment and regimen due to unspecified reason: Secondary | ICD-10-CM

## 2016-05-09 DIAGNOSIS — Z9119 Patient's noncompliance with other medical treatment and regimen: Secondary | ICD-10-CM

## 2016-05-09 DIAGNOSIS — E1159 Type 2 diabetes mellitus with other circulatory complications: Secondary | ICD-10-CM

## 2016-05-09 DIAGNOSIS — I10 Essential (primary) hypertension: Secondary | ICD-10-CM | POA: Diagnosis not present

## 2016-05-09 DIAGNOSIS — E785 Hyperlipidemia, unspecified: Secondary | ICD-10-CM | POA: Diagnosis not present

## 2016-05-09 MED ORDER — INSULIN ASPART 100 UNIT/ML FLEXPEN: 10.0000 [IU] | PEN_INJECTOR | Freq: Three times a day (TID) | SUBCUTANEOUS | 2 refills | Status: DC

## 2016-05-09 MED ORDER — INSULIN DEGLUDEC 100 UNIT/ML ~~LOC~~ SOPN
50.0000 [IU] | PEN_INJECTOR | Freq: Every day | SUBCUTANEOUS | 2 refills | Status: DC
Start: 2016-05-09 — End: 2016-09-11

## 2016-05-09 NOTE — Progress Notes (Signed)
Subjective:    Patient ID: Nicholas [name], male    DOB: [birth year],    Past Medical History:  Diagnosis Date  . Anal lesion    Anal papilla/tags - external lesion  . Colon polyp    Hyperplastic rectosigmoid polyp 9/08  . Coronary atherosclerosis of native coronary artery    DES to PLB, BMS to mid and distal RCA, DES to circumflex and distal LAD, 6/10 (5 stents)  . GERD (gastroesophageal reflux disease)   . Helicobacter pylori (H. pylori) 09/2009   Treated with helidac  . Hiatal hernia   . Hyperplastic colon polyp 2008  . Mixed hyperlipidemia   . Myocardial infarction 06/2008  . Type 2 diabetes mellitus ([place])    Past Surgical History:  Procedure Laterality Date  . CHOLECYSTECTOMY    . COLLAPSED LUNG     15 years ago  . COLONOSCOPY  10/12/2006   Dr. Gala Romney- rectosigmoid polyp s/phot snare polypectomy o/w normal rectum and terminal ileum. hyperplastic polyp on bx  . EP IMPLANTABLE DEVICE N/A 02/12/2016   Procedure: Loop Recorder Insertion;  Surgeon: Thompson Grayer, MD;  Location: [facility];  Service: Cardiovascular;  Laterality: N/A;  . ESOPHAGOGASTRODUODENOSCOPY  10/12/2006   Dr. Gala Romney- examination of the tubular esophagus revealed no mucosal abnormalities. the EG junction was easily traversed. small hiatal hernia, the gastric mucosa o/w appeared normal. there was no infiltrating process or frank ulcer seen.  . TEE WITHOUT CARDIOVERSION N/A 02/12/2016   Procedure: TRANSESOPHAGEAL ECHOCARDIOGRAM (TEE);  Surgeon: Jerline Pain, MD;  Location: [facility] ENDOSCOPY;  Service: Cardiovascular;  Laterality: N/A;  . WIRE IN APEX OF RIGHT LUNG     Since childhood   Social History   Social History  . Marital status: Married    Spouse name: N/A  . Number of children: N/A  . Years of education: N/A   Occupational History  . maintenance, parking lots Self-Employed   Social History Main Topics  . Smoking status: Current Every Day Smoker    Packs/day: 0.50    Years: 34.00    Types:  Cigarettes  . Smokeless tobacco: Never Used  . Alcohol use No  . Drug use: No  . Sexual activity: Yes    Partners: Female    Birth control/ protection: Pill   Other Topics Concern  . None   Social History Narrative   Drinks Coffee 2 cups daily.  Lives sat home with wife.   Is self employed.  Graduated from High school.     Outpatient Encounter Prescriptions as of 05/09/2016  Medication Sig  . acetaminophen (TYLENOL) 500 MG tablet Take 1,000 mg by mouth every 6 (six) hours as needed.  Marland Kitchen aspirin 325 MG EC tablet Take 325 mg by mouth daily.    Marland Kitchen atorvastatin (LIPITOR) 40 MG tablet Take 1 tablet (40 mg total) by mouth daily at 6 PM.  . clopidogrel (PLAVIX) 75 MG tablet TAKE ONE TABLET BY MOUTH ONCE DAILY.  Marland Kitchen gemfibrozil (LOPID) 600 MG tablet Take 1 tablet (600 mg total) by mouth 2 (two) times daily before a meal.  . glucose blood (BAYER CONTOUR TEST) test strip Use as instructed 4 x daily E11.65. Contour plus test strips  . insulin aspart (NOVOLOG FLEXPEN) 100 UNIT/ML FlexPen Inject 10-16 Units into the skin 3 (three) times daily with meals.  . insulin degludec (TRESIBA) 100 UNIT/ML SOPN FlexTouch Pen Inject 0.5 mLs (50 Units total) into the skin daily at 10 pm.  . nicotine (NICODERM CQ -  DOSED IN MG/24 HOURS) 21 mg/24hr patch Place 1 patch (21 mg total) onto the skin daily. (Patient not taking: Reported on 04/21/2016)  . NITROSTAT 0.4 MG SL tablet DISSOLVE 1 TABLET UNDER TONGUE EVERY 5 MINUTES UP TO 15 MIN FOR CHESTPAIN. IF NO RELIEF CALL 911.  . pantoprazole (PROTONIX) 40 MG tablet Take 1 tablet by mouth daily.   . traMADol (ULTRAM) 50 MG tablet Take 1 tablet (50 mg total) by mouth every 6 (six) hours as needed for moderate pain.  . [DISCONTINUED] insulin aspart (NOVOLOG FLEXPEN) 100 UNIT/ML FlexPen Inject 12-18 Units into the skin 3 (three) times daily with meals.   No facility-administered encounter medications on file as of 05/09/2016.    ALLERGIES: No Known Allergies VACCINATION  STATUS:  There is no immunization history on file for this patient.  Diabetes  He presents for his follow-up diabetic visit. He has type 2 diabetes mellitus. Onset time: He was diagnosed at approximate age of 53 years. In this particular patient with a history of heavy alcohol use possiblity of pancreatic diabetes is high. His disease course has been improving. There are no hypoglycemic associated symptoms. Pertinent negatives for hypoglycemia include no confusion, headaches, pallor or seizures. Pertinent negatives for diabetes include no chest pain, no fatigue, no polydipsia, no polyphagia, no polyuria and no weakness. There are no hypoglycemic complications. Symptoms are improving. Diabetic complications include a CVA and heart disease. Risk factors for coronary artery disease include diabetes mellitus, dyslipidemia, hypertension, male sex, sedentary lifestyle and tobacco exposure. Current diabetic treatment includes oral agent (dual therapy). He is compliant with treatment some of the time. His weight is stable. He is following a generally unhealthy diet. When asked about meal planning, he reported none. He has not had a previous visit with a dietitian (he did not keep appointment.). He rarely participates in exercise. His overall blood glucose range is >200 mg/dl. (He brought a meter which shows 0- 1 monitoring daily averaging 206, unfortunately he documents on his log as if she is testing 3-4 times a day. Most of his readings are suspicious.) Eye exam is current.  Hyperlipidemia  This is a chronic problem. The current episode started more than 1 year ago. Pertinent negatives include no chest pain, myalgias or shortness of breath. Current antihyperlipidemic treatment includes statins.  Hypertension  This is a chronic problem. The current episode started more than 1 year ago. Pertinent negatives include no chest pain, headaches, neck pain, palpitations or shortness of breath. Past treatments include beta  blockers. Hypertensive end-organ damage includes CAD/MI and CVA.    Review of Systems  Constitutional: Negative for fatigue and unexpected weight change.  HENT: Negative for dental problem, mouth sores and trouble swallowing.   Eyes: Negative for visual disturbance.  Respiratory: Negative for cough, choking, chest tightness, shortness of breath and wheezing.   Cardiovascular: Negative for chest pain, palpitations and leg swelling.  Gastrointestinal: Negative for abdominal distention, abdominal pain, constipation, diarrhea, nausea and vomiting.  Endocrine: Negative for polydipsia, polyphagia and polyuria.  Genitourinary: Negative for dysuria, flank pain, hematuria and urgency.  Musculoskeletal: Negative for back pain, gait problem, myalgias and neck pain.  Skin: Negative for pallor, rash and wound.  Neurological: Negative for seizures, syncope, weakness, numbness and headaches.  Psychiatric/Behavioral: Negative.  Negative for confusion and dysphoric mood.    Objective:    BP 120/75   Pulse 81   Ht '5\' 9"'$  (1.753 m)   Wt 167 lb (75.8 kg)   BMI 24.66 kg/m  Wt Readings from Last 3 Encounters:  05/09/16 167 lb (75.8 kg)  04/21/16 169 lb 12.8 oz (77 kg)  02/29/16 168 lb (76.2 kg)    Physical Exam  Constitutional: He is oriented to person, place, and time. He appears well-developed and well-nourished. He is cooperative. No distress.  HENT:  Head: Normocephalic and atraumatic.  Eyes: EOM are normal.  Neck: Normal range of motion. Neck supple. No tracheal deviation present. No thyromegaly present.  Cardiovascular: Normal rate, S1 normal, S2 normal and normal heart sounds.  Exam reveals no gallop.   No murmur heard. Pulses:      Dorsalis pedis pulses are 1+ on the right side, and 1+ on the left side.       Posterior tibial pulses are 1+ on the right side, and 1+ on the left side.  Pulmonary/Chest: Breath sounds normal. No respiratory distress. He has no wheezes.  Abdominal: Soft.  Bowel sounds are normal. He exhibits no distension. There is no tenderness. There is no guarding and no CVA tenderness.  Musculoskeletal: He exhibits no edema.       Right shoulder: He exhibits no swelling and no deformity.  Neurological: He is alert and oriented to person, place, and time. He has normal strength and normal reflexes. No cranial nerve deficit or sensory deficit. Gait normal.  Skin: Skin is warm and dry. No rash noted. No cyanosis. Nails show no clubbing.  Psychiatric: He has a normal mood and affect. His speech is normal and behavior is normal. Judgment and thought content normal. Cognition and memory are normal.    Results for orders placed or performed in visit on 05/02/16  Comprehensive metabolic panel  Result Value Ref Range   Sodium 139 135 - 146 mmol/L   Potassium 4.7 3.5 - 5.3 mmol/L   Chloride 105 98 - 110 mmol/L   CO2 27 20 - 31 mmol/L   Glucose, Bld 164 (H) 65 - 99 mg/dL   BUN 15 7 - 25 mg/dL   Creat 0.74 0.70 - 1.33 mg/dL   Total Bilirubin 0.6 0.2 - 1.2 mg/dL   Alkaline Phosphatase 70 40 - 115 U/L   AST 16 10 - 35 U/L   ALT 18 9 - 46 U/L   Total Protein 7.2 6.1 - 8.1 g/dL   Albumin 4.4 3.6 - 5.1 g/dL   Calcium 9.9 8.6 - 10.3 mg/dL  Hemoglobin A1c  Result Value Ref Range   Hgb A1c MFr Bld 8.2 (H) <5.7 %   Mean Plasma Glucose 189 mg/dL   Diabetic Labs (most recent): Lab Results  Component Value Date   HGBA1C 8.2 (H) 05/02/2016   HGBA1C 11.3 (H) 02/10/2016   HGBA1C 9.1 (H) 10/10/2015   Lipid Panel     Component Value Date/Time   CHOL 212 (H) 02/10/2016 0554   TRIG 347 (H) 02/10/2016 0554   HDL 24 (L) 02/10/2016 0554   CHOLHDL 8.8 02/10/2016 0554   VLDL 69 (H) 02/10/2016 0554   LDLCALC 119 (H) 02/10/2016 0554      Assessment & Plan:   1. Type 2 diabetes mellitus with vascular disease (HCC) -His diabetes is  complicated by coronary artery disease and patient remains at a high risk for more acute and chronic complications of diabetes which  include CAD, CVA, CKD, retinopathy, and neuropathy. These are all discussed in detail with the patient.  -- This patient has been alarmingly noncompliant, his recent A1c has improved to 8.2% from 11.3%. However most recently he is disengaging  from proper monitoring and insulin administration.   - I have re-counseled the patient on diet management  by adopting a carbohydrate restricted / protein rich  Diet.  - Suggestion is made for patient to avoid simple carbohydrates   from their diet including Cakes , Desserts, Ice Cream,  Soda (  diet and regular) , Sweet Tea , Candies,  Chips, Cookies, Artificial Sweeteners,   and "Sugar-free" Products .  This will help patient to have stable blood glucose profile and potentially avoid unintended  Weight gain.  - Patient is advised to stick to a routine mealtimes to eat 3 meals  a day and avoid unnecessary snacks ( to snack only to correct hypoglycemia).  - The patient  will be  scheduled with Jearld Fenton, RDN, CDE for individualized DM education.  - I have approached patient with the following individualized plan to manage diabetes and patient agrees.  - I asked him to continue  Tresiba 50 units at bedtime ,  decrease NovoLog  10 units 3 times a day before meals for pre-meal blood glucose above 90 mg/dL, associated with strict monitoring of blood glucose before meals and at bedtime.  -He is encouraged to call clinic for blood glucose levels less than 70 or above 200 mg /dl. - He is not the right candidate for Invokana due to weight loss, unintentional. - Proper use of insulin is the  exclusive choice he has to treat his diabetes.  -I gave him brochure and info on continuous glucose monitoring device. -Patient  is not a candidate for  incretin therapy. -Target numbers for A1c, LDL, HDL, Triglycerides, Waist Circumference were discussed in detail.   2) BP/HTN: Controlled. Continue current medications including beta blockers. 3) Lipids/HPL:  continue  statins. 4)  Weight/Diet:   he missed his CDE consult, exercise, and carbohydrates information provided.  5) Chronic Care/Health Maintenance:  -Patient is  Statin medications and encouraged to continue to follow up with Ophthalmology, Podiatrist at least yearly or according to recommendations, and advised to  quit Smoking (he says he stopped smoking since he was diagnosed with a stroke). I have recommended yearly flu vaccine and pneumonia vaccination at least every 5 years; moderate intensity exercise for up to 150 minutes weekly; and  sleep for at least 7 hours a day.  I advised patient to maintain close follow up with their PCP for primary care needs.  Patient is asked to bring meter and  blood glucose logs during their next visit.   Follow up plan: Return in about 3 months (around 08/08/2016) for meter, and logs, follow up with pre-visit labs, meter, and logs.  Glade Lloyd, MD Phone: [phone]  Fax: [phone]   05/09/2016, 8:54 AM

## 2016-05-12 ENCOUNTER — Ambulatory Visit (INDEPENDENT_AMBULATORY_CARE_PROVIDER_SITE_OTHER): Payer: BLUE CROSS/BLUE SHIELD | Admitting: *Deleted

## 2016-05-12 ENCOUNTER — Other Ambulatory Visit: Payer: Self-pay

## 2016-05-12 ENCOUNTER — Telehealth: Payer: Self-pay | Admitting: "Endocrinology

## 2016-05-12 DIAGNOSIS — I639 Cerebral infarction, unspecified: Secondary | ICD-10-CM

## 2016-05-12 MED ORDER — INSULIN DETEMIR 100 UNIT/ML FLEXPEN: 50.0000 [IU] | PEN_INJECTOR | Freq: Every day | SUBCUTANEOUS | 2 refills | Status: DC

## 2016-05-12 NOTE — Progress Notes (Signed)
[facility]link Summary Report / Loop Recorder 

## 2016-05-12 NOTE — Telephone Encounter (Signed)
Levemir sent

## 2016-05-12 NOTE — Telephone Encounter (Signed)
Nicholas [name] is calling stating that his insurance is not covering his insulin insulin degludec (TRESIBA) 100 UNIT/ML SOPN FlexTouch Pen he has been out for 2 days now, please advise?

## 2016-05-25 LAB — CUP PACEART REMOTE DEVICE CHECK
Implantable Pulse Generator Implant Date: 20180116
MDC IDC SESS DTM: 20180416143924

## 2016-05-25 NOTE — Progress Notes (Signed)
Carelink summary report received. Battery status OK. Normal device function. No new symptom episodes, tachy episodes, brady, or pause episodes. No new AF episodes. Monthly summary reports and ROV/PRN 

## 2016-06-11 ENCOUNTER — Ambulatory Visit (INDEPENDENT_AMBULATORY_CARE_PROVIDER_SITE_OTHER): Payer: BLUE CROSS/BLUE SHIELD | Admitting: *Deleted

## 2016-06-11 DIAGNOSIS — I639 Cerebral infarction, unspecified: Secondary | ICD-10-CM | POA: Diagnosis not present

## 2016-06-11 NOTE — Progress Notes (Signed)
[facility]link Summary Report / Loop Recorder 

## 2016-06-23 LAB — CUP PACEART REMOTE DEVICE CHECK
MDC IDC PG IMPLANT DT: 20180116
MDC IDC SESS DTM: 20180516153624

## 2016-07-11 ENCOUNTER — Ambulatory Visit (INDEPENDENT_AMBULATORY_CARE_PROVIDER_SITE_OTHER): Payer: BLUE CROSS/BLUE SHIELD | Admitting: *Deleted

## 2016-07-11 DIAGNOSIS — I639 Cerebral infarction, unspecified: Secondary | ICD-10-CM

## 2016-07-11 NOTE — Progress Notes (Signed)
[facility]link Summary Report / Loop Recorder 

## 2016-07-21 ENCOUNTER — Ambulatory Visit: Payer: BLUE CROSS/BLUE SHIELD | Admitting: Nurse Practitioner

## 2016-07-21 LAB — CUP PACEART REMOTE DEVICE CHECK
Implantable Pulse Generator Implant Date: 20180116
MDC IDC SESS DTM: 20180615160924

## 2016-07-21 NOTE — Progress Notes (Signed)
Carelink summary report received. Battery status OK. Normal device function. No new symptom episodes, tachy episodes, brady, or pause episodes. No new AF episodes. Monthly summary reports and ROV/PRN 

## 2016-07-22 ENCOUNTER — Encounter: Payer: Self-pay | Admitting: Nurse Practitioner

## 2016-08-08 ENCOUNTER — Other Ambulatory Visit: Payer: Self-pay | Admitting: Cardiology

## 2016-08-11 ENCOUNTER — Ambulatory Visit (INDEPENDENT_AMBULATORY_CARE_PROVIDER_SITE_OTHER): Payer: BLUE CROSS/BLUE SHIELD | Admitting: *Deleted

## 2016-08-11 DIAGNOSIS — I639 Cerebral infarction, unspecified: Secondary | ICD-10-CM | POA: Diagnosis not present

## 2016-08-11 NOTE — Progress Notes (Signed)
[facility]link Summary Report / Loop Recorder 

## 2016-08-15 ENCOUNTER — Other Ambulatory Visit: Payer: Self-pay | Admitting: "Endocrinology

## 2016-08-19 ENCOUNTER — Ambulatory Visit: Payer: BLUE CROSS/BLUE SHIELD | Admitting: "Endocrinology

## 2016-08-25 ENCOUNTER — Other Ambulatory Visit: Payer: Self-pay | Admitting: "Endocrinology

## 2016-08-25 LAB — COMPREHENSIVE METABOLIC PANEL
ALBUMIN: 4.2 g/dL (ref 3.6–5.1)
ALT: 15 U/L (ref 9–46)
AST: 13 U/L (ref 10–35)
Alkaline Phosphatase: 69 U/L (ref 40–115)
BILIRUBIN TOTAL: 0.5 mg/dL (ref 0.2–1.2)
BUN: 11 mg/dL (ref 7–25)
CO2: 26 mmol/L (ref 20–31)
CREATININE: 0.7 mg/dL (ref 0.70–1.33)
Calcium: 9.1 mg/dL (ref 8.6–10.3)
Chloride: 103 mmol/L (ref 98–110)
Glucose, Bld: 183 mg/dL — ABNORMAL HIGH (ref 65–99)
Potassium: 5 mmol/L (ref 3.5–5.3)
SODIUM: 138 mmol/L (ref 135–146)
TOTAL PROTEIN: 6.3 g/dL (ref 6.1–8.1)

## 2016-08-25 LAB — CUP PACEART REMOTE DEVICE CHECK
Implantable Pulse Generator Implant Date: 20180116
MDC IDC SESS DTM: 20180715163956

## 2016-08-26 LAB — HEMOGLOBIN A1C
HEMOGLOBIN A1C: 8.5 % — AB (ref <5.7)
MEAN PLASMA GLUCOSE: 197 mg/dL

## 2016-08-27 ENCOUNTER — Encounter: Payer: Self-pay | Admitting: Cardiology

## 2016-08-27 NOTE — Progress Notes (Signed)
Cardiology Office Note  Date: 08/28/2016   ID: Nicholas, [name] [birth year], MRN [id number]  PCP: Nicholas Sites, MD  Primary Cardiologist: Nicholas Lesches, MD   Chief Complaint  Patient presents with  . Coronary Artery Disease    History of Present Illness: Nicholas [name] is a 56 y.o. male that I last saw in July 2017. I reviewed interval records, he presented in January of this year with a left occipital cortical and subcortical stroke, embolic pattern however no obvious arrhythmias or source of emboli documented by workup. He was placed on dual antiplatelet therapy by Neurology. Patient underwent placement of a Medtronic ILR in January of this year by Dr. Rayann [name], followed in the device clinic. Recent interrogation showed no arrhythmias. He has not had any atrial fibrillation documented.  He presents today for a follow-up visit. Other than feeling like sometimes his thought process is "slow" he does not report any obvious residua from prior stroke. States that he remains active, rides horses on his property, also working. He does not report any angina symptoms or nitroglycerin use.  I reviewed his medications which are outlined below. He is on Lipitor, due for follow-up lipid panel which we discussed today. He states that he has an order from Dr. Hilma Favors to get this done already.  Past Medical History:  Diagnosis Date  . Anal lesion    Anal papilla/tags - external lesion  . Colon polyp    Hyperplastic rectosigmoid polyp 9/08  . Coronary atherosclerosis of native coronary artery    DES to PLB, BMS to mid and distal RCA, DES to circumflex and distal LAD, 6/10 (5 stents)  . GERD (gastroesophageal reflux disease)   . Helicobacter pylori (H. pylori) 09/2009   Treated with helidac  . Hiatal hernia   . Hyperplastic colon polyp 2008  . Mixed hyperlipidemia   . Myocardial infarction ([place]) 06/2008  . Stroke [facility])    January 2018  . Type 2 diabetes mellitus ([place])     Past Surgical  History:  Procedure Laterality Date  . CHOLECYSTECTOMY    . COLLAPSED LUNG     15 years ago  . COLONOSCOPY  10/12/2006   Dr. Gala [name]- rectosigmoid polyp s/phot snare polypectomy o/w normal rectum and terminal ileum. hyperplastic polyp on bx  . EP IMPLANTABLE DEVICE N/A 02/12/2016   Procedure: Loop Recorder Insertion;  Surgeon: Nicholas Grayer, MD;  Location: [facility];  Service: Cardiovascular;  Laterality: N/A;  . ESOPHAGOGASTRODUODENOSCOPY  10/12/2006   Dr. Gala [name]- examination of the tubular esophagus revealed no mucosal abnormalities. the EG junction was easily traversed. small hiatal hernia, the gastric mucosa o/w appeared normal. there was no infiltrating process or frank ulcer seen.  . TEE WITHOUT CARDIOVERSION N/A 02/12/2016   Procedure: TRANSESOPHAGEAL ECHOCARDIOGRAM (TEE);  Surgeon: Nicholas Pain, MD;  Location: [facility] ENDOSCOPY;  Service: Cardiovascular;  Laterality: N/A;  . WIRE IN APEX OF RIGHT LUNG     Since childhood    Current Outpatient Prescriptions  Medication Sig Dispense Refill  . aspirin 325 MG EC tablet Take 325 mg by mouth daily.      Marland Kitchen atorvastatin (LIPITOR) 40 MG tablet Take 1 tablet (40 mg total) by mouth daily at 6 PM. 30 tablet 0  . clopidogrel (PLAVIX) 75 MG tablet TAKE ONE TABLET BY MOUTH ONCE DAILY. 30 tablet 6  . gemfibrozil (LOPID) 600 MG tablet Take 1 tablet (600 mg total) by mouth 2 (two) times daily before a meal. 60 tablet 3  .  glucose blood (BAYER CONTOUR TEST) test strip Use as instructed 4 x daily E11.65. Contour plus test strips 150 each 5  . insulin aspart (NOVOLOG FLEXPEN) 100 UNIT/ML FlexPen Inject 10-16 Units into the skin 3 (three) times daily with meals. 15 mL 2  . insulin degludec (TRESIBA) 100 UNIT/ML SOPN FlexTouch Pen Inject 0.5 mLs (50 Units total) into the skin daily at 10 pm. 15 mL 2  . LEVEMIR FLEXTOUCH 100 UNIT/ML Pen INJECT 50 UNITS SUBCUTANEOUSLY AT BEDTIME. 15 mL 2  . nicotine (NICODERM CQ - DOSED IN MG/24 HOURS) 21 mg/24hr patch Place  1 patch (21 mg total) onto the skin daily. 14 patch 0  . NITROSTAT 0.4 MG SL tablet DISSOLVE 1 TABLET UNDER TONGUE EVERY 5 MINUTES UP TO 15 MIN FOR CHESTPAIN. IF NO RELIEF CALL 911. 25 tablet 1  . pantoprazole (PROTONIX) 40 MG tablet Take 1 tablet by mouth daily.     . traMADol (ULTRAM) 50 MG tablet Take 1 tablet (50 mg total) by mouth every 6 (six) hours as needed for moderate [name]. 15 tablet 0  . acetaminophen (TYLENOL) 500 MG tablet Take 1,000 mg by mouth every 6 (six) hours as needed.     No current facility-administered medications for this visit.    Allergies:  Patient has no known allergies.   Social History: The patient  reports that he has been smoking Cigarettes.  He has a 17.00 pack-year smoking history. He has never used smokeless tobacco. He reports that he does not drink alcohol or use drugs.   ROS:  Please see the history of present illness. Otherwise, complete review of systems is positive for none.  All other systems are reviewed and negative.   Physical Exam: VS:  BP 110/66 (BP Location: Right Arm)   Pulse 90   Ht 5\' 9"  (1.753 m)   Wt 162 lb (73.5 kg)   SpO2 96%   BMI 23.92 kg/m , BMI Body mass index is 23.92 kg/m.  Wt Readings from Last 3 Encounters:  08/28/16 162 lb (73.5 kg)  05/09/16 167 lb (75.8 kg)  04/21/16 169 lb 12.8 oz (77 kg)    Appears comfortable at rest. HEENT: Conjuctivae and lids normal, oropharynx clear.  Neck: Supple, no elevated JVP, no carotid bruits.  Lungs: Nonlabored breathing at rest. Clear without rales.  Cor: RRR. No pathologic systolic murmurs. No S3 or rub.  Abd: Soft, nontender, normal bowel sounds.  Skin: Warm and dry.  Musculoskeletal: No kyphosis.  Extremities: No pitting edema.  Neuropsychiatric: Alert and oriented x3, affect appropriate.   ECG: I personally reviewed the tracing from 02/09/2016 which showed sinus tachycardia with incomplete right bundle branch block.  Recent Labwork: 02/09/2016: Hemoglobin 16.3;  Platelets 155 02/11/2016: TSH 0.904 08/25/2016: ALT 15; AST 13; BUN 11; Creat 0.70; Potassium 5.0; Sodium 138     Component Value Date/Time   CHOL 212 (H) 02/10/2016 0554   TRIG 347 (H) 02/10/2016 0554   HDL 24 (L) 02/10/2016 0554   CHOLHDL 8.8 02/10/2016 0554   VLDL 69 (H) 02/10/2016 0554   LDLCALC 119 (H) 02/10/2016 0554    Other Studies Reviewed Today:  TEE 02/12/2016: Study Conclusions  - Left ventricle: The cavity size was normal. Wall thickness was   normal. Systolic function was normal. The estimated ejection   fraction was in the range of 60% to 65%. Wall motion was normal;   there were no regional wall motion abnormalities. - Aortic valve: No evidence of vegetation. - Mitral valve: No  evidence of vegetation. - Left atrium: No evidence of thrombus in the atrial cavity or   appendage. No evidence of thrombus in the appendage. - Right atrium: No evidence of thrombus in the atrial cavity or   appendage. - Atrial septum: No defect or patent foramen ovale was identified.   Echo contrast study showed no right-to-left atrial level shunt,   following an increase in RA pressure induced by provocative   maneuvers. Late bubbles (trivial amount) were present and likely   represent small non clinical intrapulmonary shunt. - Tricuspid valve: No evidence of vegetation. - Pulmonic valve: No evidence of vegetation. - Superior vena cava: The study excluded a thrombus.  Impressions:  - No cardiac source of emboli was indentified.  Lexiscan Cardiolite 07/24/2014:  There was no ST segment deviation noted during stress.  Findings consistent with prior inferolatral myocardial infarction with mild peri-infarct ischemia.  This is a low risk study.  The left ventricular ejection fraction is normal (55-65%).  Assessment and Plan:  1. CAD with history of multivessel PCI in 2010. Ischemic workup in 2016 was low risk and is not reporting any angina symptoms at this time on medical  therapy. Plan to continue observation.  2. History of stroke in January as detailed above. No associated cardiac arrhythmia. He does have an ILR in place with ongoing interrogation. He continues on dual antiplatelet therapy at this point and has pending follow-up with Neurology.  3. Hyperlipidemia, remains on Lipitor. Recommended follow-up lipid panel with PCP.  4. Type 2 diabetes mellitus, continues to follow with endocrinology. Recent hemoglobin A1c 8.5. This is down from 11.3 in January.  Current medicines were reviewed with the patient today.  Disposition: Follow-up in 6 months.  Signed, Satira Sark, MD, [facility] 08/28/2016 9:50 AM    [facility]. [address], [place], [place] [address] Phone: [phone]; Fax: [phone]

## 2016-08-28 ENCOUNTER — Ambulatory Visit (INDEPENDENT_AMBULATORY_CARE_PROVIDER_SITE_OTHER): Payer: BLUE CROSS/BLUE SHIELD | Admitting: Cardiology

## 2016-08-28 ENCOUNTER — Encounter: Payer: Self-pay | Admitting: Cardiology

## 2016-08-28 VITALS — BP 110/66 | HR 90 | Ht 69.0 in | Wt 162.0 lb

## 2016-08-28 DIAGNOSIS — I2510 Atherosclerotic heart disease of native coronary artery without angina pectoris: Secondary | ICD-10-CM | POA: Diagnosis not present

## 2016-08-28 DIAGNOSIS — Z8673 Personal history of transient ischemic attack (TIA), and cerebral infarction without residual deficits: Secondary | ICD-10-CM

## 2016-08-28 DIAGNOSIS — E782 Mixed hyperlipidemia: Secondary | ICD-10-CM | POA: Diagnosis not present

## 2016-08-28 DIAGNOSIS — E118 Type 2 diabetes mellitus with unspecified complications: Secondary | ICD-10-CM

## 2016-08-28 DIAGNOSIS — Z794 Long term (current) use of insulin: Secondary | ICD-10-CM | POA: Diagnosis not present

## 2016-08-28 NOTE — Patient Instructions (Signed)
Your physician wants you to follow-up in:6 months with Dr.McDowell You will receive a reminder letter in the mail two months in advance. If you don't receive a letter, please call our office to schedule the follow-up appointment.   Your physician recommends that you continue on your current medications as directed. Please refer to the Current Medication list given to you today.   If you need a refill on your cardiac medications before your next appointment, please call your pharmacy.    No lab work or tests ordered today.      Thank you for choosing [facility] !         

## 2016-09-09 ENCOUNTER — Ambulatory Visit (INDEPENDENT_AMBULATORY_CARE_PROVIDER_SITE_OTHER): Payer: BLUE CROSS/BLUE SHIELD | Admitting: *Deleted

## 2016-09-09 DIAGNOSIS — I639 Cerebral infarction, unspecified: Secondary | ICD-10-CM | POA: Diagnosis not present

## 2016-09-10 ENCOUNTER — Telehealth: Payer: Self-pay | Admitting: Internal Medicine

## 2016-09-10 ENCOUNTER — Encounter: Payer: Self-pay | Admitting: Internal Medicine

## 2016-09-10 NOTE — Telephone Encounter (Signed)
Recall for tcs

## 2016-09-10 NOTE — Telephone Encounter (Signed)
Letter mailed to pt.  

## 2016-09-11 ENCOUNTER — Encounter: Payer: Self-pay | Admitting: "Endocrinology

## 2016-09-11 ENCOUNTER — Ambulatory Visit (INDEPENDENT_AMBULATORY_CARE_PROVIDER_SITE_OTHER): Payer: BLUE CROSS/BLUE SHIELD | Admitting: "Endocrinology

## 2016-09-11 VITALS — BP 109/66 | HR 82 | Ht 69.0 in | Wt 164.0 lb

## 2016-09-11 DIAGNOSIS — I10 Essential (primary) hypertension: Secondary | ICD-10-CM | POA: Diagnosis not present

## 2016-09-11 DIAGNOSIS — E782 Mixed hyperlipidemia: Secondary | ICD-10-CM | POA: Diagnosis not present

## 2016-09-11 DIAGNOSIS — F17200 Nicotine dependence, unspecified, uncomplicated: Secondary | ICD-10-CM | POA: Diagnosis not present

## 2016-09-11 DIAGNOSIS — Z9119 Patient's noncompliance with other medical treatment and regimen: Secondary | ICD-10-CM | POA: Diagnosis not present

## 2016-09-11 DIAGNOSIS — Z91199 Patient's noncompliance with other medical treatment and regimen due to unspecified reason: Secondary | ICD-10-CM

## 2016-09-11 DIAGNOSIS — E1159 Type 2 diabetes mellitus with other circulatory complications: Secondary | ICD-10-CM | POA: Diagnosis not present

## 2016-09-11 MED ORDER — FREESTYLE LIBRE SENSOR SYSTEM MISC: 2 refills | Status: DC

## 2016-09-11 MED ORDER — FREESTYLE LIBRE READER DEVI: 1.0000 | Freq: Once | 0 refills | Status: AC

## 2016-09-11 NOTE — Progress Notes (Signed)
Subjective:    Patient ID: Nicholas [name], male    DOB: 1960-11-04,    Past Medical History:  Diagnosis Date  . Anal lesion    Anal papilla/tags - external lesion  . Colon polyp    Hyperplastic rectosigmoid polyp 9/08  . Coronary atherosclerosis of native coronary artery    DES to PLB, BMS to mid and distal RCA, DES to circumflex and distal LAD, 6/10 (5 stents)  . GERD (gastroesophageal reflux disease)   . Helicobacter pylori (H. pylori) 09/2009   Treated with helidac  . Hiatal hernia   . Hyperplastic colon polyp 2008  . Mixed hyperlipidemia   . Myocardial infarction ([place]) 06/2008  . Stroke [facility])    January 2018  . Type 2 diabetes mellitus (Nicholas [name])    Past Surgical History:  Procedure Laterality Date  . CHOLECYSTECTOMY    . COLLAPSED LUNG     15 years ago  . COLONOSCOPY  10/12/2006   Dr. Gala Romney- rectosigmoid polyp s/phot snare polypectomy o/w normal rectum and terminal ileum. hyperplastic polyp on bx  . EP IMPLANTABLE DEVICE N/A 02/12/2016   Procedure: Loop Recorder Insertion;  Surgeon: Thompson Grayer, MD;  Location: [place] [facility];  Service: Cardiovascular;  Laterality: N/A;  . ESOPHAGOGASTRODUODENOSCOPY  10/12/2006   Dr. Gala Romney- examination of the tubular esophagus revealed no mucosal abnormalities. the EG junction was easily traversed. small hiatal hernia, the gastric mucosa o/w appeared normal. there was no infiltrating process or frank ulcer seen.  . TEE WITHOUT CARDIOVERSION N/A 02/12/2016   Procedure: TRANSESOPHAGEAL ECHOCARDIOGRAM (TEE);  Surgeon: Jerline Pain, MD;  Location: [facility];  Service: Cardiovascular;  Laterality: N/A;  . WIRE IN APEX OF RIGHT LUNG     Since childhood   Social History   Social History  . Marital status: Married    Spouse name: N/A  . Number of children: N/A  . Years of education: N/A   Occupational History  . maintenance, parking lots Self-Employed   Social History Main Topics  . Smoking status: Current Every Day Smoker   Packs/day: 0.50    Years: 34.00    Types: Cigarettes  . Smokeless tobacco: Never Used  . Alcohol use No  . Drug use: No  . Sexual activity: Yes    Partners: Female    Birth control/ protection: Pill   Other Topics Concern  . None   Social History Narrative   Drinks Coffee 2 cups daily.  Lives sat home with wife.   Is self employed.  Graduated from High school.     Outpatient Encounter Prescriptions as of 09/11/2016  Medication Sig  . acetaminophen (TYLENOL) 500 MG tablet Take 1,000 mg by mouth every 6 (six) hours as needed.  Marland Kitchen aspirin 325 MG EC tablet Take 325 mg by mouth daily.    Marland Kitchen atorvastatin (LIPITOR) 40 MG tablet Take 1 tablet (40 mg total) by mouth daily at 6 PM.  . clopidogrel (PLAVIX) 75 MG tablet TAKE ONE TABLET BY MOUTH ONCE DAILY.  Marland Kitchen Continuous Blood Gluc Receiver (FREESTYLE LIBRE READER) DEVI 1 Piece by Does not apply route once.  . Continuous Blood Gluc Sensor (FREESTYLE LIBRE SENSOR SYSTEM) MISC Use one sensor every 10 days.  Marland Kitchen gemfibrozil (LOPID) 600 MG tablet Take 1 tablet (600 mg total) by mouth 2 (two) times daily before a meal.  . glucose blood (BAYER CONTOUR TEST) test strip Use as instructed 4 x daily E11.65. Contour plus test strips  . insulin aspart (NOVOLOG FLEXPEN)  100 UNIT/ML FlexPen Inject 10-16 Units into the skin 3 (three) times daily with meals.  Marland Kitchen LEVEMIR FLEXTOUCH 100 UNIT/ML Pen INJECT 50 UNITS SUBCUTANEOUSLY AT BEDTIME.  . nicotine (NICODERM CQ - DOSED IN MG/24 HOURS) 21 mg/24hr patch Place 1 patch (21 mg total) onto the skin daily.  Marland Kitchen NITROSTAT 0.4 MG SL tablet DISSOLVE 1 TABLET UNDER TONGUE EVERY 5 MINUTES UP TO 15 MIN FOR CHESTPAIN. IF NO RELIEF CALL 911.  . pantoprazole (PROTONIX) 40 MG tablet Take 1 tablet by mouth daily.   . traMADol (ULTRAM) 50 MG tablet Take 1 tablet (50 mg total) by mouth every 6 (six) hours as needed for moderate pain.  . [DISCONTINUED] insulin degludec (TRESIBA) 100 UNIT/ML SOPN FlexTouch Pen Inject 0.5 mLs (50 Units total)  into the skin daily at 10 pm.   No facility-administered encounter medications on file as of 09/11/2016.    ALLERGIES: No Known Allergies VACCINATION STATUS:  There is no immunization history on file for this patient.  Diabetes  He presents for his follow-up diabetic visit. He has type 2 diabetes mellitus. Onset time: He was diagnosed at approximate age of 72 years. In this particular patient with a history of heavy alcohol use possiblity of pancreatic diabetes is high. His disease course has been worsening. There are no hypoglycemic associated symptoms. Pertinent negatives for hypoglycemia include no confusion, headaches, pallor or seizures. Pertinent negatives for diabetes include no chest pain, no fatigue, no polydipsia, no polyphagia, no polyuria and no weakness. There are no hypoglycemic complications. Symptoms are worsening. Diabetic complications include a CVA and heart disease. Risk factors for coronary artery disease include diabetes mellitus, dyslipidemia, hypertension, male sex, sedentary lifestyle and tobacco exposure. Current diabetic treatment includes oral agent (dual therapy). He is compliant with treatment some of the time. His weight is stable. He is following a generally unhealthy diet. When asked about meal planning, he reported none. He has not had a previous visit with a dietitian (he did not keep appointment.). He rarely participates in exercise. (Patient  came with no meter nor logs to review today.) Eye exam is current.  Hyperlipidemia  This is a chronic problem. The current episode started more than 1 year ago. Pertinent negatives include no chest pain, myalgias or shortness of breath. Current antihyperlipidemic treatment includes statins.  Hypertension  This is a chronic problem. The current episode started more than 1 year ago. Pertinent negatives include no chest pain, headaches, neck pain, palpitations or shortness of breath. Past treatments include beta blockers.  Hypertensive end-organ damage includes CAD/MI and CVA.    Review of Systems  Constitutional: Negative for fatigue and unexpected weight change.  HENT: Negative for dental problem, mouth sores and trouble swallowing.   Eyes: Negative for visual disturbance.  Respiratory: Negative for cough, choking, chest tightness, shortness of breath and wheezing.   Cardiovascular: Negative for chest pain, palpitations and leg swelling.  Gastrointestinal: Negative for abdominal distention, abdominal pain, constipation, diarrhea, nausea and vomiting.  Endocrine: Negative for polydipsia, polyphagia and polyuria.  Genitourinary: Negative for dysuria, flank pain, hematuria and urgency.  Musculoskeletal: Negative for back pain, gait problem, myalgias and neck pain.  Skin: Negative for pallor, rash and wound.  Neurological: Negative for seizures, syncope, weakness, numbness and headaches.  Psychiatric/Behavioral: Negative.  Negative for confusion and dysphoric mood.    Objective:    BP 109/66   Pulse 82   Ht 5\' 9"  (1.753 m)   Wt 164 lb (74.4 kg)   BMI 24.22 kg/m  Wt Readings from Last 3 Encounters:  09/11/16 164 lb (74.4 kg)  08/28/16 162 lb (73.5 kg)  05/09/16 167 lb (75.8 kg)    Physical Exam  Constitutional: He is oriented to person, place, and time. He appears well-developed and well-nourished. He is cooperative. No distress.  HENT:  Head: Normocephalic and atraumatic.  Eyes: EOM are normal.  Neck: Normal range of motion. Neck supple. No tracheal deviation present. No thyromegaly present.  Cardiovascular: Normal rate, S1 normal, S2 normal and normal heart sounds.  Exam reveals no gallop.   No murmur heard. Pulses:      Dorsalis pedis pulses are 1+ on the right side, and 1+ on the left side.       Posterior tibial pulses are 1+ on the right side, and 1+ on the left side.  Pulmonary/Chest: Breath sounds normal. No respiratory distress. He has no wheezes.  Abdominal: Soft. Bowel sounds are  normal. He exhibits no distension. There is no tenderness. There is no guarding and no CVA tenderness.  Musculoskeletal: He exhibits no edema.       Right shoulder: He exhibits no swelling and no deformity.  Neurological: He is alert and oriented to person, place, and time. He has normal strength and normal reflexes. No cranial nerve deficit or sensory deficit. Gait normal.  Skin: Skin is warm and dry. No rash noted. No cyanosis. Nails show no clubbing.  Psychiatric: His speech is normal. Cognition and memory are normal.  Noncompliant, passive and unconcerned affect.    Results for orders placed or performed in visit on 08/25/16  Comprehensive metabolic panel  Result Value Ref Range   Sodium 138 135 - 146 mmol/L   Potassium 5.0 3.5 - 5.3 mmol/L   Chloride 103 98 - 110 mmol/L   CO2 26 20 - 31 mmol/L   Glucose, Bld 183 (H) 65 - 99 mg/dL   BUN 11 7 - 25 mg/dL   Creat 0.70 0.70 - 1.33 mg/dL   Total Bilirubin 0.5 0.2 - 1.2 mg/dL   Alkaline Phosphatase 69 40 - 115 U/L   AST 13 10 - 35 U/L   ALT 15 9 - 46 U/L   Total Protein 6.3 6.1 - 8.1 g/dL   Albumin 4.2 3.6 - 5.1 g/dL   Calcium 9.1 8.6 - 10.3 mg/dL  Hemoglobin A1c  Result Value Ref Range   Hgb A1c MFr Bld 8.5 (H) <5.7 %   Mean Plasma Glucose 197 mg/dL   Diabetic Labs (most recent): Lab Results  Component Value Date   HGBA1C 8.5 (H) 08/25/2016   HGBA1C 8.2 (H) 05/02/2016   HGBA1C 11.3 (H) 02/10/2016   Lipid Panel     Component Value Date/Time   CHOL 212 (H) 02/10/2016 0554   TRIG 347 (H) 02/10/2016 0554   HDL 24 (L) 02/10/2016 0554   CHOLHDL 8.8 02/10/2016 0554   VLDL 69 (H) 02/10/2016 0554   LDLCALC 119 (H) 02/10/2016 0554      Assessment & Plan:   1. Type 2 diabetes mellitus with vascular disease (HCC) -His diabetes is  complicated by coronary artery disease and patient remains at a high risk for more acute and chronic complications of diabetes which include CAD, CVA, CKD, retinopathy, and neuropathy. These are all  discussed in detail with the patient.  -- This patient has been alarmingly noncompliant, his recent A1c has increased to 8.5% from  8.2%. - He did have A1c as high as 11.3% last year.  -   He has  disengaged from  monitoring and insulin administration.   - I have re-counseled the patient on diet management  by adopting a carbohydrate restricted / protein rich  Diet.  Suggestion is made for him to avoid simple carbohydrates  from his diet including Cakes, Sweet Desserts, Ice Cream, Soda (diet and regular), Sweet Tea, Candies, Chips, Cookies, Store Bought Juices, Alcohol in Excess of  1-2 drinks a day, Artificial Sweeteners, and "Sugar-free" Products. This will help patient to have stable blood glucose profile and potentially avoid unintended weight gain.   - Patient is advised to stick to a routine mealtimes to eat 3 meals  a day and avoid unnecessary snacks ( to snack only to correct hypoglycemia).  - I have approached patient with the following individualized plan to manage diabetes and patient agrees.  - Proper use of insulin is the  exclusive choice he has to treat his diabetes. - I asked him to resume strict monitoring of blood glucose 4 times a day-before meals and at bedtime and continue Levemir  50 units at bedtime ,  NovoLog  10 units 3 times a day before meals for pre-meal blood glucose above 90 mg/dL. - He will use an additional correction dose of NovoLog for blood glucose readings above 250 mg/dL. -He is encouraged to call clinic for blood glucose levels less than 70 or above 200 mg /dl. - He is not the right candidate for Invokana nor incretintherapy. -I gave him brochure and info on continuous glucose monitoring device DEXCOM- did not initiate any call.  - He would benefit from this device, I will send in a prescription for the [facility] device.   -Target numbers for A1c, LDL, HDL, Triglycerides, Waist Circumference were discussed in detail.   2) BP/HTN: Controlled. Continue  current medications including beta blockers. He has marginal blood pressure would not tolerate additional ACE inhibitors.  3) Lipids/HPL:   uncontrolled with LDL 119 slightly improving from 133 , I advised him to continue statins.  4)  Weight/Diet:   he missed his CDE consult, exercise, and carbohydrates information provided.  5) Chronic Care/Health Maintenance:  -Patient is  Statin medications and encouraged to continue to follow up with Ophthalmology, Podiatrist at least yearly or according to recommendations, and advised to  quit Smoking (he says he stopped smoking since he was diagnosed with a stroke). I have recommended yearly flu vaccine and pneumonia vaccination at least every 5 years; moderate intensity exercise for up to 150 minutes weekly; and  sleep for at least 7 hours a day. - He has a chronic heavy smoker , cannot exercise optimally.  - Time spent with the patient: 25 min, of which >50% was spent in reviewing his sugar logs , discussing his hypo- and hyper-glycemic episodes, reviewing  previous labs and insulin doses and developing a plan to avoid hypo- and hyper-glycemia.   I advised patient to maintain close follow up with his PCP for primary care needs.  Patient is asked to bring meter and  blood glucose logs during his next visit.   Follow up plan: Return in about 3 months (around 12/12/2016) for follow up with pre-visit labs, meter, and logs.  Glade Lloyd, MD Phone: [phone]  Fax: [phone]   09/11/2016, 8:41 AM

## 2016-09-11 NOTE — Patient Instructions (Signed)

## 2016-09-13 LAB — CUP PACEART REMOTE DEVICE CHECK
Implantable Pulse Generator Implant Date: 20180116
MDC IDC SESS DTM: 20180814173836

## 2016-09-13 NOTE — Progress Notes (Signed)
Carelink summary report received. Battery status OK. Normal device function. No new symptom episodes, tachy episodes, brady, or pause episodes. No new AF episodes. Monthly summary reports and ROV/PRN 

## 2016-09-13 NOTE — Progress Notes (Signed)
Loop recorder summary report

## 2016-09-16 ENCOUNTER — Telehealth: Payer: Self-pay | Admitting: Nutrition

## 2016-09-16 NOTE — Telephone Encounter (Signed)
Thank you for your help with this patient

## 2016-09-16 NOTE — Telephone Encounter (Signed)
TC from pt who states he tried to have his reader read his [place] sensor and it said sensor was not working and gave message of start new sensor or defective ??. Called [employer] [place] [employer] Rep, [place] and she stated to have pt. Contact number on back of reader and they would trouble shoot him through the problem. If sensor was defective,then they would send him a new one at no cost. Pt. Verbalized understanding. Encourage him to cal back if further problems or questions. Requested Rep to reach out to pt this pm to verify all is working ok. She noted noted she would.

## 2016-09-17 ENCOUNTER — Other Ambulatory Visit: Payer: Self-pay | Admitting: "Endocrinology

## 2016-09-30 ENCOUNTER — Ambulatory Visit (INDEPENDENT_AMBULATORY_CARE_PROVIDER_SITE_OTHER): Payer: BLUE CROSS/BLUE SHIELD | Admitting: "Endocrinology

## 2016-09-30 ENCOUNTER — Encounter: Payer: Self-pay | Admitting: "Endocrinology

## 2016-09-30 VITALS — BP 133/83 | HR 88 | Ht 69.0 in | Wt 161.0 lb

## 2016-09-30 DIAGNOSIS — I10 Essential (primary) hypertension: Secondary | ICD-10-CM | POA: Diagnosis not present

## 2016-09-30 DIAGNOSIS — E782 Mixed hyperlipidemia: Secondary | ICD-10-CM | POA: Diagnosis not present

## 2016-09-30 DIAGNOSIS — E1159 Type 2 diabetes mellitus with other circulatory complications: Secondary | ICD-10-CM | POA: Diagnosis not present

## 2016-09-30 MED ORDER — INSULIN ASPART 100 UNIT/ML FLEXPEN: 12.0000 [IU] | PEN_INJECTOR | Freq: Three times a day (TID) | SUBCUTANEOUS | 2 refills | Status: DC

## 2016-09-30 NOTE — Progress Notes (Signed)
Subjective:    Patient ID: Nicholas [name], male    DOB: 1960/10/29,    Past Medical History:  Diagnosis Date  . Anal lesion    Anal papilla/tags - external lesion  . Colon polyp    Hyperplastic rectosigmoid polyp 9/08  . Coronary atherosclerosis of native coronary artery    DES to PLB, BMS to mid and distal RCA, DES to circumflex and distal LAD, 6/10 (5 stents)  . GERD (gastroesophageal reflux disease)   . Helicobacter pylori (H. pylori) 09/2009   Treated with helidac  . Hiatal hernia   . Hyperplastic colon polyp 2008  . Mixed hyperlipidemia   . Myocardial infarction ([place]) 06/2008  . Stroke [facility])    January 2018  . Type 2 diabetes mellitus ([place])    Past Surgical History:  Procedure Laterality Date  . CHOLECYSTECTOMY    . COLLAPSED LUNG     15 years ago  . COLONOSCOPY  10/12/2006   Dr. Gala Romney- rectosigmoid polyp s/phot snare polypectomy o/w normal rectum and terminal ileum. hyperplastic polyp on bx  . EP IMPLANTABLE DEVICE N/A 02/12/2016   Procedure: Loop Recorder Insertion;  Surgeon: Thompson Grayer, MD;  Location: [place] [facility];  Service: Cardiovascular;  Laterality: N/A;  . ESOPHAGOGASTRODUODENOSCOPY  10/12/2006   Dr. Gala Romney- examination of the tubular esophagus revealed no mucosal abnormalities. the EG junction was easily traversed. small hiatal hernia, the gastric mucosa o/w appeared normal. there was no infiltrating process or frank ulcer seen.  . TEE WITHOUT CARDIOVERSION N/A 02/12/2016   Procedure: TRANSESOPHAGEAL ECHOCARDIOGRAM (TEE);  Surgeon: Jerline Pain, MD;  Location: [facility] ENDOSCOPY;  Service: Cardiovascular;  Laterality: N/A;  . WIRE IN APEX OF RIGHT LUNG     Since childhood   Social History   Social History  . Marital status: Married    Spouse name: N/A  . Number of children: N/A  . Years of education: N/A   Occupational History  . maintenance, parking lots Self-Employed   Social History Main Topics  . Smoking status: Current Every Day Smoker   Packs/day: 0.50    Years: 34.00    Types: Cigarettes  . Smokeless tobacco: Never Used  . Alcohol use No  . Drug use: No  . Sexual activity: Yes    Partners: Female    Birth control/ protection: Pill   Other Topics Concern  . None   Social History Narrative   Drinks Coffee 2 cups daily.  Lives sat home with wife.   Is self employed.  Graduated from High school.     Outpatient Encounter Prescriptions as of 09/30/2016  Medication Sig  . acetaminophen (TYLENOL) 500 MG tablet Take 1,000 mg by mouth every 6 (six) hours as needed.  Marland Kitchen aspirin 325 MG EC tablet Take 325 mg by mouth daily.    Marland Kitchen atorvastatin (LIPITOR) 40 MG tablet Take 1 tablet (40 mg total) by mouth daily at 6 PM.  . clopidogrel (PLAVIX) 75 MG tablet TAKE ONE TABLET BY MOUTH ONCE DAILY.  Marland Kitchen Continuous Blood Gluc Sensor (FREESTYLE LIBRE SENSOR SYSTEM) MISC Use one sensor every 10 days.  Marland Kitchen gemfibrozil (LOPID) 600 MG tablet Take 1 tablet (600 mg total) by mouth 2 (two) times daily before a meal.  . glucose blood (BAYER CONTOUR TEST) test strip Use as instructed 4 x daily E11.65. Contour plus test strips  . insulin aspart (NOVOLOG FLEXPEN) 100 UNIT/ML FlexPen Inject 12-18 Units into the skin 3 (three) times daily with meals.  Marland Kitchen LEVEMIR  FLEXTOUCH 100 UNIT/ML Pen INJECT 50 UNITS SUBCUTANEOUSLY AT BEDTIME.  . nicotine (NICODERM CQ - DOSED IN MG/24 HOURS) 21 mg/24hr patch Place 1 patch (21 mg total) onto the skin daily.  Marland Kitchen NITROSTAT 0.4 MG SL tablet DISSOLVE 1 TABLET UNDER TONGUE EVERY 5 MINUTES UP TO 15 MIN FOR CHESTPAIN. IF NO RELIEF CALL 911.  . pantoprazole (PROTONIX) 40 MG tablet Take 1 tablet by mouth daily.   . traMADol (ULTRAM) 50 MG tablet Take 1 tablet (50 mg total) by mouth every 6 (six) hours as needed for moderate pain.  . [DISCONTINUED] insulin aspart (NOVOLOG FLEXPEN) 100 UNIT/ML FlexPen Inject 10-16 Units into the skin 3 (three) times daily with meals.   No facility-administered encounter medications on file as of  09/30/2016.    ALLERGIES: No Known Allergies VACCINATION STATUS:  There is no immunization history on file for this patient.  Diabetes  He presents for his follow-up diabetic visit. He has type 2 diabetes mellitus. Onset time: He was diagnosed at approximate age of 59 years. In this particular patient with a history of heavy alcohol use possiblity of pancreatic diabetes is high. His disease course has been improving. There are no hypoglycemic associated symptoms. Pertinent negatives for hypoglycemia include no confusion, headaches, pallor or seizures. Pertinent negatives for diabetes include no chest pain, no fatigue, no polydipsia, no polyphagia, no polyuria and no weakness. There are no hypoglycemic complications. Symptoms are worsening. Diabetic complications include a CVA and heart disease. Risk factors for coronary artery disease include diabetes mellitus, dyslipidemia, hypertension, male sex, sedentary lifestyle and tobacco exposure. Current diabetic treatment includes oral agent (dual therapy). He is compliant with treatment some of the time. His weight is stable. He is following a generally unhealthy diet. When asked about meal planning, he reported none. He has not had a previous visit with a dietitian (he did not keep appointment.). He rarely participates in exercise. His breakfast blood glucose range is generally 140-180 mg/dl. His lunch blood glucose range is generally 180-200 mg/dl. His dinner blood glucose range is generally 180-200 mg/dl. His bedtime blood glucose range is generally 180-200 mg/dl. His overall blood glucose range is 180-200 mg/dl. (She currently wears the continuous glucose monitoring device, Libre showing average blood glucose of 194 over the last 7 days. This is considered significant improvement for him.) Eye exam is current.  Hyperlipidemia  This is a chronic problem. The current episode started more than 1 year ago. Pertinent negatives include no chest pain, myalgias or  shortness of breath. Current antihyperlipidemic treatment includes statins.  Hypertension  This is a chronic problem. The current episode started more than 1 year ago. Pertinent negatives include no chest pain, headaches, neck pain, palpitations or shortness of breath. Past treatments include beta blockers. Hypertensive end-organ damage includes CAD/MI and CVA.    Review of Systems  Constitutional: Negative for fatigue and unexpected weight change.  HENT: Negative for dental problem, mouth sores and trouble swallowing.   Eyes: Negative for visual disturbance.  Respiratory: Negative for cough, choking, chest tightness, shortness of breath and wheezing.   Cardiovascular: Negative for chest pain, palpitations and leg swelling.  Gastrointestinal: Negative for abdominal distention, abdominal pain, constipation, diarrhea, nausea and vomiting.  Endocrine: Negative for polydipsia, polyphagia and polyuria.  Genitourinary: Negative for dysuria, flank pain, hematuria and urgency.  Musculoskeletal: Negative for back pain, gait problem, myalgias and neck pain.  Skin: Negative for pallor, rash and wound.  Neurological: Negative for seizures, syncope, weakness, numbness and headaches.  Psychiatric/Behavioral: Negative.  Negative for confusion and dysphoric mood.    Objective:    BP 133/83   Pulse 88   Ht 5\' 9"  (1.753 m)   Wt 161 lb (73 kg)   BMI 23.78 kg/m   Wt Readings from Last 3 Encounters:  09/30/16 161 lb (73 kg)  09/11/16 164 lb (74.4 kg)  08/28/16 162 lb (73.5 kg)    Physical Exam  Constitutional: He is oriented to person, place, and time. He appears well-developed and well-nourished. He is cooperative. No distress.  HENT:  Head: Normocephalic and atraumatic.  Eyes: EOM are normal.  Neck: Normal range of motion. Neck supple. No tracheal deviation present. No thyromegaly present.  Cardiovascular: Normal rate, S1 normal, S2 normal and normal heart sounds.  Exam reveals no gallop.   No  murmur heard. Pulses:      Dorsalis pedis pulses are 1+ on the right side, and 1+ on the left side.       Posterior tibial pulses are 1+ on the right side, and 1+ on the left side.  Pulmonary/Chest: Breath sounds normal. No respiratory distress. He has no wheezes.  Abdominal: Soft. Bowel sounds are normal. He exhibits no distension. There is no tenderness. There is no guarding and no CVA tenderness.  Musculoskeletal: He exhibits no edema.       Right shoulder: He exhibits no swelling and no deformity.  Neurological: He is alert and oriented to person, place, and time. He has normal strength and normal reflexes. No cranial nerve deficit or sensory deficit. Gait normal.  Skin: Skin is warm and dry. No rash noted. No cyanosis. Nails show no clubbing.  Psychiatric: His speech is normal. Cognition and memory are normal.  Noncompliant, passive and unconcerned affect.    Results for orders placed or performed in visit on 09/09/16  CUP PACEART REMOTE DEVICE CHECK  Result Value Ref Range   Date Time Interrogation Session [id number]    Pulse Generator Manufacturer MERM    Pulse Gen Model [id number] Reveal LINQ    Pulse Gen Serial Number [id number]    Clinic Name [facility]    Implantable Pulse Generator Type ICM/ILR    Implantable Pulse Generator Implant Date [id number]    Eval Rhythm SR    Diabetic Labs (most recent): Lab Results  Component Value Date   HGBA1C 8.5 (H) 08/25/2016   HGBA1C 8.2 (H) 05/02/2016   HGBA1C 11.3 (H) 02/10/2016   Lipid Panel     Component Value Date/Time   CHOL 212 (H) 02/10/2016 0554   TRIG 347 (H) 02/10/2016 0554   HDL 24 (L) 02/10/2016 0554   CHOLHDL 8.8 02/10/2016 0554   VLDL 69 (H) 02/10/2016 0554   LDLCALC 119 (H) 02/10/2016 0554      Assessment & Plan:   1. Type 2 diabetes mellitus with vascular disease (HCC) -His diabetes is  complicated by coronary artery disease and patient remains at a high risk for more acute and chronic complications  of diabetes which include CAD, CVA, CKD, retinopathy, and neuropathy. These are all discussed in detail with the patient.  -- This patient has  alarmingly noncompliance, his recent A1c has increased to 8.5% from  8.2%. - He did have A1c as high as 11.3% last year.  -  He wears the continuous glucose monitoring device this time, doing relatively better with average blood glucose of 194 over the last 7 days.   - I have re-counseled the patient on diet management  by adopting a carbohydrate restricted /  protein rich  Diet.  Suggestion is made for him to avoid simple carbohydrates  from his diet including Cakes, Sweet Desserts, Ice Cream, Soda (diet and regular), Sweet Tea, Candies, Chips, Cookies, Store Bought Juices, Alcohol in Excess of  1-2 drinks a day, Artificial Sweeteners, and "Sugar-free" Products. This will help patient to have stable blood glucose profile and potentially avoid unintended weight gain.   - Patient is advised to stick to a routine mealtimes to eat 3 meals  a day and avoid unnecessary snacks ( to snack only to correct hypoglycemia).  - I have approached patient with the following individualized plan to manage diabetes and patient agrees.  - Proper use of insulin is the  exclusive choice he has to treat his diabetes. - He wears the continuous glucose monitoring device Libre which helped significantly. - I advised him to  continue Levemir  50 units at bedtime ,  Increase NovoLog  To 12 units 3 times a day before meals for pre-meal blood glucose above 90 mg/dL. - He will use an additional correction dose of NovoLog for blood glucose readings above 150 mg/dL. -He is encouraged to call clinic for blood glucose levels less than 70 or above 200 mg /dl. - He is not the right candidate for Invokana nor incretintherapy.  -Target numbers for A1c, LDL, HDL, Triglycerides, Waist Circumference were discussed in detail.   2) BP/HTN: Controlled. Continue current medications including  beta blockers. He has marginal blood pressure would not tolerate additional ACE inhibitors.  3) Lipids/HPL:   uncontrolled with LDL 119 slightly improving from 133 , I advised him to continue statins.  4)  Weight/Diet:   he   following with  CDE consult, exercise, and carbohydrates information provided.  5) Chronic Care/Health Maintenance:  -Patient is  Statin medications and encouraged to continue to follow up with Ophthalmology, Podiatrist at least yearly or according to recommendations, and advised to  quit Smoking (he says he stopped smoking since he was diagnosed with a stroke). I have recommended yearly flu vaccine and pneumonia vaccination at least every 5 years; moderate intensity exercise for up to 150 minutes weekly; and  sleep for at least 7 hours a day. - He has a chronic heavy smoker , cannot exercise optimally.  - Time spent with the patient: 25 min, of which >50% was spent in reviewing his sugar logs , discussing his hypo- and hyper-glycemic episodes on his continuous glucose monitoring device, reviewing his current and  previous labs and insulin doses and developing a plan to avoid hypo- and hyper-glycemia.    I advised patient to maintain close follow up with his PCP for primary care needs.  Patient is asked to bring meter and  blood glucose logs during his next visit.   Follow up plan: Return in about 9 weeks (around 12/02/2016) for follow up with pre-visit labs, meter, and logs.  Glade Lloyd, MD Phone: [phone]  Fax: [phone]  This note was partially dictated with voice recognition software. Similar sounding words can be transcribed inadequately or may not  be corrected upon review.  09/30/2016, 9:05 AM

## 2016-10-09 ENCOUNTER — Ambulatory Visit (INDEPENDENT_AMBULATORY_CARE_PROVIDER_SITE_OTHER): Payer: Self-pay | Admitting: *Deleted

## 2016-10-09 DIAGNOSIS — I639 Cerebral infarction, unspecified: Secondary | ICD-10-CM

## 2016-10-09 NOTE — Progress Notes (Signed)
[facility]link Summary Report / Loop Recorder 

## 2016-10-14 LAB — CUP PACEART REMOTE DEVICE CHECK
Implantable Pulse Generator Implant Date: 20180116
MDC IDC SESS DTM: 20180913173856

## 2016-11-10 ENCOUNTER — Ambulatory Visit (INDEPENDENT_AMBULATORY_CARE_PROVIDER_SITE_OTHER): Payer: 59 | Admitting: *Deleted

## 2016-11-10 DIAGNOSIS — I639 Cerebral infarction, unspecified: Secondary | ICD-10-CM

## 2016-11-10 NOTE — Progress Notes (Signed)
[facility]link Summary Report / Loop Recorder 

## 2016-11-11 LAB — CUP PACEART REMOTE DEVICE CHECK
Implantable Pulse Generator Implant Date: 20180116
MDC IDC SESS DTM: 20181013180958

## 2016-12-08 ENCOUNTER — Ambulatory Visit (INDEPENDENT_AMBULATORY_CARE_PROVIDER_SITE_OTHER): Payer: 59 | Admitting: *Deleted

## 2016-12-08 DIAGNOSIS — I639 Cerebral infarction, unspecified: Secondary | ICD-10-CM | POA: Diagnosis not present

## 2016-12-09 NOTE — Progress Notes (Signed)
[facility]link Summary Report / Loop Recorder 

## 2016-12-15 LAB — CUP PACEART REMOTE DEVICE CHECK
Date Time Interrogation Session: 20181112184058
MDC IDC PG IMPLANT DT: 20180116

## 2016-12-20 ENCOUNTER — Other Ambulatory Visit: Payer: Self-pay | Admitting: "Endocrinology

## 2016-12-25 ENCOUNTER — Ambulatory Visit: Payer: BLUE CROSS/BLUE SHIELD | Admitting: "Endocrinology

## 2017-01-07 ENCOUNTER — Ambulatory Visit (INDEPENDENT_AMBULATORY_CARE_PROVIDER_SITE_OTHER): Payer: 59 | Admitting: *Deleted

## 2017-01-07 DIAGNOSIS — I639 Cerebral infarction, unspecified: Secondary | ICD-10-CM | POA: Diagnosis not present

## 2017-01-08 NOTE — Progress Notes (Signed)
[facility]link Summary Report / Loop Recorder 

## 2017-01-14 LAB — CUP PACEART REMOTE DEVICE CHECK
Date Time Interrogation Session: 20181212184153
MDC IDC PG IMPLANT DT: 20180116

## 2017-02-06 ENCOUNTER — Ambulatory Visit (INDEPENDENT_AMBULATORY_CARE_PROVIDER_SITE_OTHER): Payer: 59 | Admitting: *Deleted

## 2017-02-06 DIAGNOSIS — I639 Cerebral infarction, unspecified: Secondary | ICD-10-CM

## 2017-02-10 NOTE — Progress Notes (Signed)
[facility]link Summary Report / Loop Recorder 

## 2017-02-17 LAB — CUP PACEART REMOTE DEVICE CHECK
Date Time Interrogation Session: 20190111191039
MDC IDC PG IMPLANT DT: 20180116

## 2017-03-09 ENCOUNTER — Ambulatory Visit (INDEPENDENT_AMBULATORY_CARE_PROVIDER_SITE_OTHER): Payer: 59 | Admitting: *Deleted

## 2017-03-09 DIAGNOSIS — I639 Cerebral infarction, unspecified: Secondary | ICD-10-CM

## 2017-03-09 NOTE — Progress Notes (Signed)
[facility]link Summary Report / Loop Recorder 

## 2017-04-01 LAB — CUP PACEART REMOTE DEVICE CHECK
MDC IDC PG IMPLANT DT: 20180116
MDC IDC SESS DTM: 20190210194009

## 2017-04-07 ENCOUNTER — Other Ambulatory Visit: Payer: Self-pay | Admitting: "Endocrinology

## 2017-04-07 DIAGNOSIS — E1165 Type 2 diabetes mellitus with hyperglycemia: Secondary | ICD-10-CM

## 2017-04-10 ENCOUNTER — Ambulatory Visit (INDEPENDENT_AMBULATORY_CARE_PROVIDER_SITE_OTHER): Payer: 59 | Admitting: *Deleted

## 2017-04-10 DIAGNOSIS — I639 Cerebral infarction, unspecified: Secondary | ICD-10-CM

## 2017-04-13 NOTE — Progress Notes (Signed)
[facility]link Summary Report / Loop Recorder 

## 2017-04-19 ENCOUNTER — Other Ambulatory Visit: Payer: Self-pay | Admitting: "Endocrinology

## 2017-04-20 LAB — COMPREHENSIVE METABOLIC PANEL
AG Ratio: 1.9 (calc) (ref 1.0–2.5)
ALKALINE PHOSPHATASE (APISO): 69 U/L (ref 40–115)
ALT: 14 U/L (ref 9–46)
AST: 12 U/L (ref 10–35)
Albumin: 4.1 g/dL (ref 3.6–5.1)
BUN/Creatinine Ratio: 19 (calc) (ref 6–22)
BUN: 12 mg/dL (ref 7–25)
CHLORIDE: 108 mmol/L (ref 98–110)
CO2: 26 mmol/L (ref 20–32)
CREATININE: 0.64 mg/dL — AB (ref 0.70–1.33)
Calcium: 9.3 mg/dL (ref 8.6–10.3)
GLOBULIN: 2.2 g/dL (ref 1.9–3.7)
Glucose, Bld: 169 mg/dL — ABNORMAL HIGH (ref 65–99)
Potassium: 4.2 mmol/L (ref 3.5–5.3)
Sodium: 140 mmol/L (ref 135–146)
Total Bilirubin: 0.5 mg/dL (ref 0.2–1.2)
Total Protein: 6.3 g/dL (ref 6.1–8.1)

## 2017-04-20 LAB — HEMOGLOBIN A1C
HEMOGLOBIN A1C: 8.9 %{Hb} — AB (ref <5.7)
MEAN PLASMA GLUCOSE: 209 (calc)
eAG (mmol/L): 11.6 (calc)

## 2017-04-21 ENCOUNTER — Encounter: Payer: Self-pay | Admitting: "Endocrinology

## 2017-04-21 ENCOUNTER — Ambulatory Visit (INDEPENDENT_AMBULATORY_CARE_PROVIDER_SITE_OTHER): Payer: 59 | Admitting: "Endocrinology

## 2017-04-21 VITALS — BP 105/66 | HR 89 | Ht 69.0 in | Wt 160.0 lb

## 2017-04-21 DIAGNOSIS — I10 Essential (primary) hypertension: Secondary | ICD-10-CM | POA: Diagnosis not present

## 2017-04-21 DIAGNOSIS — E1159 Type 2 diabetes mellitus with other circulatory complications: Secondary | ICD-10-CM | POA: Diagnosis not present

## 2017-04-21 DIAGNOSIS — Z9119 Patient's noncompliance with other medical treatment and regimen: Secondary | ICD-10-CM

## 2017-04-21 DIAGNOSIS — E782 Mixed hyperlipidemia: Secondary | ICD-10-CM | POA: Diagnosis not present

## 2017-04-21 DIAGNOSIS — E1165 Type 2 diabetes mellitus with hyperglycemia: Secondary | ICD-10-CM

## 2017-04-21 DIAGNOSIS — Z91199 Patient's noncompliance with other medical treatment and regimen due to unspecified reason: Secondary | ICD-10-CM

## 2017-04-21 MED ORDER — FREESTYLE LIBRE 14 DAY SENSOR MISC: 1.0000 | 5 refills | Status: DC

## 2017-04-21 MED ORDER — FREESTYLE LIBRE 14 DAY READER DEVI: 1.0000 | 0 refills | Status: DC

## 2017-04-21 NOTE — Progress Notes (Signed)
Subjective:    Patient ID: Nicholas [name], male    DOB: 28-Aug-1960,    Past Medical History:  Diagnosis Date  . Anal lesion    Anal papilla/tags - external lesion  . Colon polyp    Hyperplastic rectosigmoid polyp 9/08  . Coronary atherosclerosis of native coronary artery    DES to PLB, BMS to mid and distal RCA, DES to circumflex and distal LAD, 6/10 (5 stents)  . GERD (gastroesophageal reflux disease)   . Helicobacter pylori (H. pylori) 09/2009   Treated with helidac  . Hiatal hernia   . Hyperplastic colon polyp 2008  . Mixed hyperlipidemia   . Myocardial infarction ([place]) 06/2008  . Stroke [facility])    January 2018  . Type 2 diabetes mellitus ([place])    Past Surgical History:  Procedure Laterality Date  . CHOLECYSTECTOMY    . COLLAPSED LUNG     15 years ago  . COLONOSCOPY  10/12/2006   Dr. Gala Romney- rectosigmoid polyp s/phot snare polypectomy o/w normal rectum and terminal ileum. hyperplastic polyp on bx  . EP IMPLANTABLE DEVICE N/A 02/12/2016   Procedure: Loop Recorder Insertion;  Surgeon: Thompson Grayer, MD;  Location: [facility];  Service: Cardiovascular;  Laterality: N/A;  . ESOPHAGOGASTRODUODENOSCOPY  10/12/2006   Dr. Gala Romney- examination of the tubular esophagus revealed no mucosal abnormalities. the EG junction was easily traversed. small hiatal hernia, the gastric mucosa o/w appeared normal. there was no infiltrating process or frank ulcer seen.  . TEE WITHOUT CARDIOVERSION N/A 02/12/2016   Procedure: TRANSESOPHAGEAL ECHOCARDIOGRAM (TEE);  Surgeon: Jerline Pain, MD;  Location: [facility] ENDOSCOPY;  Service: Cardiovascular;  Laterality: N/A;  . WIRE IN APEX OF RIGHT LUNG     Since childhood   Social History   Socioeconomic History  . Marital status: Married    Spouse name: Not on file  . Number of children: Not on file  . Years of education: Not on file  . Highest education level: Not on file  Occupational History  . Occupation: maintenance, parking lots    Employer:  SELF-EMPLOYED  Social Needs  . Financial resource strain: Not on file  . Food insecurity:    Worry: Not on file    Inability: Not on file  . Transportation needs:    Medical: Not on file    Non-medical: Not on file  Tobacco Use  . Smoking status: Current Every Day Smoker    Packs/day: 0.50    Years: 34.00    Pack years: 17.00    Types: Cigarettes  . Smokeless tobacco: Never Used  Substance and Sexual Activity  . Alcohol use: No    Alcohol/week: 0.0 oz  . Drug use: No  . Sexual activity: Yes    Partners: Female    Birth control/protection: Pill  Lifestyle  . Physical activity:    Days per week: Not on file    Minutes per session: Not on file  . Stress: Not on file  Relationships  . Social connections:    Talks on phone: Not on file    Gets together: Not on file    Attends religious service: Not on file    Active member of club or organization: Not on file    Attends meetings of clubs or organizations: Not on file    Relationship status: Not on file  Other Topics Concern  . Not on file  Social History Narrative   Drinks Coffee 2 cups daily.  Lives sat home  with wife.   Is self employed.  Graduated from High school.     Outpatient Encounter Medications as of 04/21/2017  Medication Sig  . Continuous Blood Gluc Receiver (FREESTYLE LIBRE 14 DAY READER) DEVI 1 each by Does not apply route every 14 (fourteen) days.  . Continuous Blood Gluc Sensor (FREESTYLE LIBRE 14 DAY SENSOR) MISC 1 each by Does not apply route every 14 (fourteen) days.  . [DISCONTINUED] Continuous Blood Gluc Receiver (FREESTYLE LIBRE 14 DAY READER) DEVI by Does not apply route every 14 (fourteen) days.  . [DISCONTINUED] Continuous Blood Gluc Sensor (FREESTYLE LIBRE 14 DAY SENSOR) MISC by Does not apply route every 14 (fourteen) days.  Marland Kitchen acetaminophen (TYLENOL) 500 MG tablet Take 1,000 mg by mouth every 6 (six) hours as needed.  Marland Kitchen aspirin 325 MG EC tablet Take 325 mg by mouth daily.    Marland Kitchen atorvastatin  (LIPITOR) 40 MG tablet Take 1 tablet (40 mg total) by mouth daily at 6 PM.  . clopidogrel (PLAVIX) 75 MG tablet TAKE ONE TABLET BY MOUTH ONCE DAILY.  Marland Kitchen Continuous Blood Gluc Sensor (FREESTYLE LIBRE SENSOR SYSTEM) MISC USE 1 SENSOR TO CHECK GLUCOSE -- CHANGE EVERY 10 DAYS  . gemfibrozil (LOPID) 600 MG tablet Take 1 tablet (600 mg total) by mouth 2 (two) times daily before a meal.  . glucose blood (BAYER CONTOUR TEST) test strip Use as instructed 4 x daily E11.65. Contour plus test strips  . insulin aspart (NOVOLOG FLEXPEN) 100 UNIT/ML FlexPen Inject 12-18 Units into the skin 3 (three) times daily with meals.  Marland Kitchen LEVEMIR FLEXTOUCH 100 UNIT/ML Pen INJECT 50 UNITS SUBCUTANEOUSLY AT BEDTIME.  . nicotine (NICODERM CQ - DOSED IN MG/24 HOURS) 21 mg/24hr patch Place 1 patch (21 mg total) onto the skin daily.  Marland Kitchen NITROSTAT 0.4 MG SL tablet DISSOLVE 1 TABLET UNDER TONGUE EVERY 5 MINUTES UP TO 15 MIN FOR CHESTPAIN. IF NO RELIEF CALL 911.  . pantoprazole (PROTONIX) 40 MG tablet Take 1 tablet by mouth daily.   . traMADol (ULTRAM) 50 MG tablet Take 1 tablet (50 mg total) by mouth every 6 (six) hours as needed for moderate pain.  . [DISCONTINUED] topiramate (TOPAMAX) 25 MG tablet Take 25 mg by mouth daily.    No facility-administered encounter medications on file as of 04/21/2017.    ALLERGIES: No Known Allergies VACCINATION STATUS:  There is no immunization history on file for this patient.  Diabetes  He presents for his follow-up diabetic visit. He has type 2 diabetes mellitus. Onset time: He was diagnosed at approximate age of 34 years. In this particular patient with a history of heavy alcohol use possiblity of pancreatic diabetes is high. His disease course has been improving. There are no hypoglycemic associated symptoms. Pertinent negatives for hypoglycemia include no confusion, headaches, pallor or seizures. Pertinent negatives for diabetes include no chest pain, no fatigue, no polydipsia, no polyphagia,  no polyuria and no weakness. There are no hypoglycemic complications. Symptoms are worsening. Diabetic complications include a CVA and heart disease. Risk factors for coronary artery disease include diabetes mellitus, dyslipidemia, hypertension, male sex, sedentary lifestyle and tobacco exposure. Current diabetic treatment includes oral agent (dual therapy). He is compliant with treatment some of the time. His weight is stable. He is following a generally unhealthy diet. When asked about meal planning, he reported none. He has not had a previous visit with a dietitian (he did not keep appointment.). He rarely participates in exercise. His breakfast blood glucose range is generally 180-200 mg/dl. His  lunch blood glucose range is generally 180-200 mg/dl. His dinner blood glucose range is generally >200 mg/dl. His bedtime blood glucose range is generally >200 mg/dl. His overall blood glucose range is 180-200 mg/dl. (She currently wears the continuous glucose monitoring device, Libre showing average blood glucose of 241 over the last 3 months, only 12% of the time he is in range, 88% of the time he is above range.  He did not document anything about his insulin administration.  His A1c is higher at 8.9%.   ) Eye exam is current.  Hyperlipidemia  This is a chronic problem. The current episode started more than 1 year ago. Pertinent negatives include no chest pain, myalgias or shortness of breath. Current antihyperlipidemic treatment includes statins.  Hypertension  This is a chronic problem. The current episode started more than 1 year ago. Pertinent negatives include no chest pain, headaches, neck pain, palpitations or shortness of breath. Past treatments include beta blockers. Hypertensive end-organ damage includes CAD/MI and CVA.    Review of Systems  Constitutional: Negative for fatigue and unexpected weight change.  HENT: Negative for dental problem, mouth sores and trouble swallowing.   Eyes: Negative  for visual disturbance.  Respiratory: Negative for cough, choking, chest tightness, shortness of breath and wheezing.   Cardiovascular: Negative for chest pain, palpitations and leg swelling.  Gastrointestinal: Negative for abdominal distention, abdominal pain, constipation, diarrhea, nausea and vomiting.  Endocrine: Negative for polydipsia, polyphagia and polyuria.  Genitourinary: Negative for dysuria, flank pain, hematuria and urgency.  Musculoskeletal: Negative for back pain, gait problem, myalgias and neck pain.  Skin: Negative for pallor, rash and wound.  Neurological: Negative for seizures, syncope, weakness, numbness and headaches.  Psychiatric/Behavioral: Negative.  Negative for confusion and dysphoric mood.    Objective:    BP 105/66   Pulse 89   Ht 5\' 9"  (1.753 m)   Wt 160 lb (72.6 kg)   BMI 23.63 kg/m   Wt Readings from Last 3 Encounters:  04/21/17 160 lb (72.6 kg)  09/30/16 161 lb (73 kg)  09/11/16 164 lb (74.4 kg)    Physical Exam  Constitutional: He is oriented to person, place, and time. He appears well-developed. He is cooperative. No distress.  HENT:  Head: Normocephalic and atraumatic.  Eyes: EOM are normal.  Neck: Normal range of motion. Neck supple. No tracheal deviation present. No thyromegaly present.  Cardiovascular: Normal rate, S1 normal and S2 normal. Exam reveals no gallop.  No murmur heard. Pulses:      Dorsalis pedis pulses are 1+ on the right side, and 1+ on the left side.       Posterior tibial pulses are 1+ on the right side, and 1+ on the left side.  Pulmonary/Chest: No respiratory distress. He has no wheezes.  Abdominal: He exhibits no distension. There is no tenderness. There is no guarding and no CVA tenderness.  Musculoskeletal: He exhibits no edema.       Right shoulder: He exhibits no swelling and no deformity.  Neurological: He is alert and oriented to person, place, and time. He has normal strength and normal reflexes. No cranial nerve  deficit or sensory deficit. Gait normal.  Skin: Skin is warm and dry. No rash noted. No cyanosis. Nails show no clubbing.  Psychiatric: His speech is normal. Cognition and memory are normal.  Noncompliant, passive and unconcerned affect.    Results for orders placed or performed in visit on 04/07/17  Comprehensive metabolic panel  Result Value Ref Range  Glucose, Bld 169 (H) 65 - 99 mg/dL   BUN 12 7 - 25 mg/dL   Creat 0.64 (L) 0.70 - 1.33 mg/dL   BUN/Creatinine Ratio 19 6 - 22 (calc)   Sodium 140 135 - 146 mmol/L   Potassium 4.2 3.5 - 5.3 mmol/L   Chloride 108 98 - 110 mmol/L   CO2 26 20 - 32 mmol/L   Calcium 9.3 8.6 - 10.3 mg/dL   Total Protein 6.3 6.1 - 8.1 g/dL   Albumin 4.1 3.6 - 5.1 g/dL   Globulin 2.2 1.9 - 3.7 g/dL (calc)   AG Ratio 1.9 1.0 - 2.5 (calc)   Total Bilirubin 0.5 0.2 - 1.2 mg/dL   Alkaline phosphatase (APISO) 69 40 - 115 U/L   AST 12 10 - 35 U/L   ALT 14 9 - 46 U/L  Hemoglobin A1c  Result Value Ref Range   Hgb A1c MFr Bld 8.9 (H) <5.7 % of total Hgb   Mean Plasma Glucose 209 (calc)   eAG (mmol/L) 11.6 (calc)   Diabetic Labs (most recent): Lab Results  Component Value Date   HGBA1C 8.9 (H) 04/20/2017   HGBA1C 8.5 (H) 08/25/2016   HGBA1C 8.2 (H) 05/02/2016   Lipid Panel     Component Value Date/Time   CHOL 212 (H) 02/10/2016 0554   TRIG 347 (H) 02/10/2016 0554   HDL 24 (L) 02/10/2016 0554   CHOLHDL 8.8 02/10/2016 0554   VLDL 69 (H) 02/10/2016 0554   LDLCALC 119 (H) 02/10/2016 0554      Assessment & Plan:   1. Type 2 diabetes mellitus with vascular disease (HCC) -His diabetes is  complicated by coronary artery disease and patient remains at a high risk for more acute and chronic complications of diabetes which include CAD, CVA, CKD, retinopathy, and neuropathy. These are all discussed in detail with the patient.  -- This patient is back to his alarming noncompliance, his recent A1c has increased to 8.9% from  8.2%. - He did have A1c as high  as 11.3% last year.  -  He wears the continuous glucose monitoring device this time, ever he admits that he is missing "a lot" of insulin administration opportunities.   - I have re-counseled the patient on diet management  by adopting a carbohydrate restricted / protein rich  Diet.  -  Suggestion is made for him to avoid simple carbohydrates  from his diet including Cakes, Sweet Desserts / Pastries, Ice Cream, Soda (diet and regular), Sweet Tea, Candies, Chips, Cookies, Store Bought Juices, Alcohol in Excess of  1-2 drinks a day, Artificial Sweeteners, and "Sugar-free" Products. This will help patient to have stable blood glucose profile and potentially avoid unintended weight gain.   - Patient is advised to stick to a routine mealtimes to eat 3 meals  a day and avoid unnecessary snacks ( to snack only to correct hypoglycemia).  - I have approached patient with the following individualized plan to manage diabetes and patient agrees.  - Proper use of insulin is the  exclusive choice he has to treat his diabetes. - He wears the continuous glucose monitoring device Libre which helped significantly. -Since he did not document any insulin administration activity, it is difficult to make adjustment in his insulin doses.   -I advised him to continue Levemir  50 units at bedtime ,   NovoLog  12 units 3 times a day before meals for pre-meal blood glucose above 90 mg/dL. - He will use an additional correction  dose of NovoLog for blood glucose readings above 150 mg/dL. -He is encouraged to call clinic for blood glucose levels less than 70 or above 200 mg /dl. - He is not the right candidate for Invokana nor incretintherapy.  -Target numbers for A1c, LDL, HDL, Triglycerides, Waist Circumference were discussed in detail.   2) BP/HTN: His blood pressure is controlled to target.   Continue current medications including beta blockers. He has marginal blood pressure would not tolerate additional ACE  inhibitors.  3) Lipids/HPL:   uncontrolled with LDL 119 slightly improving from 133 , I advised him to continue statins.  4)  Weight/Diet:   he   following with  CDE consult, exercise, and carbohydrates information provided.  5) Chronic Care/Health Maintenance:  -Patient is  Statin medications and encouraged to continue to follow up with Ophthalmology, Podiatrist at least yearly or according to recommendations, and advised to  quit Smoking (he says he stopped smoking since he was diagnosed with a stroke). I have recommended yearly flu vaccine and pneumonia vaccination at least every 5 years; moderate intensity exercise for up to 150 minutes weekly; and  sleep for at least 7 hours a day. - He has a chronic heavy smoker , cannot exercise optimally.  - Time spent with the patient: 25 min, of which >50% was spent in reviewing his blood glucose logs , discussing his hypo- and hyper-glycemic episodes, reviewing his current and  previous labs and insulin doses and developing a plan to avoid hypo- and hyper-glycemia. Please refer to Patient Instructions for Blood Glucose Monitoring and Insulin/Medications Dosing Guide"  in media tab for additional information. [name] participated in the discussions, expressed understanding, and voiced agreement with the above plans.  All questions were answered to his satisfaction. he is encouraged to contact clinic should he have any questions or concerns prior to his return visit.  I advised patient to maintain close follow up with his PCP for primary care needs.  Patient is asked to bring meter and  blood glucose logs during his next visit.   Follow up plan: Return in about 2 weeks (around 05/05/2017) for follow up with meter and logs- no labs.  Glade Lloyd, MD Phone: [phone]  Fax: [phone]  This note was partially dictated with voice recognition software. Similar sounding words can be transcribed inadequately or may not  be corrected upon  review.  04/21/2017, 11:19 AM

## 2017-05-06 ENCOUNTER — Telehealth: Payer: Self-pay | Admitting: Cardiology

## 2017-05-06 NOTE — Telephone Encounter (Signed)
Spoke w/ pt and requested that he send a manual transmission b/c his home monitor has not updated in at least 14 days.   

## 2017-05-07 ENCOUNTER — Encounter: Payer: Self-pay | Admitting: "Endocrinology

## 2017-05-07 ENCOUNTER — Ambulatory Visit (INDEPENDENT_AMBULATORY_CARE_PROVIDER_SITE_OTHER): Payer: 59 | Admitting: "Endocrinology

## 2017-05-07 VITALS — BP 128/76 | HR 93 | Ht 69.0 in | Wt 163.0 lb

## 2017-05-07 DIAGNOSIS — E1159 Type 2 diabetes mellitus with other circulatory complications: Secondary | ICD-10-CM | POA: Diagnosis not present

## 2017-05-07 DIAGNOSIS — E782 Mixed hyperlipidemia: Secondary | ICD-10-CM

## 2017-05-07 DIAGNOSIS — I10 Essential (primary) hypertension: Secondary | ICD-10-CM | POA: Diagnosis not present

## 2017-05-07 MED ORDER — FREESTYLE LIBRE 14 DAY SENSOR MISC: 1.0000 | 5 refills | Status: DC

## 2017-05-07 MED ORDER — INSULIN DEGLUDEC 100 UNIT/ML ~~LOC~~ SOPN: 50.0000 [IU] | PEN_INJECTOR | Freq: Every day | SUBCUTANEOUS | 2 refills | Status: DC

## 2017-05-07 NOTE — Progress Notes (Signed)
Subjective:    Patient ID: Nicholas [name], male    DOB: [birth year],    Past Medical History:  Diagnosis Date  . Anal lesion    Anal papilla/tags - external lesion  . Colon polyp    Hyperplastic rectosigmoid polyp 9/08  . Coronary atherosclerosis of native coronary artery    DES to PLB, BMS to mid and distal RCA, DES to circumflex and distal LAD, 6/10 (5 stents)  . GERD (gastroesophageal reflux disease)   . Helicobacter pylori (H. pylori) 09/2009   Treated with helidac  . Hiatal hernia   . Hyperplastic colon polyp 2008  . Mixed hyperlipidemia   . Myocardial infarction ([place]) 06/2008  . Stroke [facility])    January 2018  . Type 2 diabetes mellitus ([place])    Past Surgical History:  Procedure Laterality Date  . CHOLECYSTECTOMY    . COLLAPSED LUNG     15 years ago  . COLONOSCOPY  10/12/2006   Dr. Gala Romney- rectosigmoid polyp s/phot snare polypectomy o/w normal rectum and terminal ileum. hyperplastic polyp on bx  . EP IMPLANTABLE DEVICE N/A 02/12/2016   Procedure: Loop Recorder Insertion;  Surgeon: Thompson Grayer, MD;  Location: [place] [facility];  Service: Cardiovascular;  Laterality: N/A;  . ESOPHAGOGASTRODUODENOSCOPY  10/12/2006   Dr. Gala Romney- examination of the tubular esophagus revealed no mucosal abnormalities. the EG junction was easily traversed. small hiatal hernia, the gastric mucosa o/w appeared normal. there was no infiltrating process or frank ulcer seen.  . TEE WITHOUT CARDIOVERSION N/A 02/12/2016   Procedure: TRANSESOPHAGEAL ECHOCARDIOGRAM (TEE);  Surgeon: Jerline Pain, MD;  Location: [facility] END[facility];  Service: Cardiovascular;  Laterality: N/A;  . WIRE IN APEX OF RIGHT LUNG     Since childhood   Social History   Socioeconomic History  . Marital status: Married    Spouse name: Not on file  . Number of children: Not on file  . Years of education: Not on file  . Highest education level: Not on file  Occupational History  . Occupation: maintenance, parking lots    Employer:  SELF-EMPLOYED  Social Needs  . Financial resource strain: Not on file  . Food insecurity:    Worry: Not on file    Inability: Not on file  . Transportation needs:    Medical: Not on file    Non-medical: Not on file  Tobacco Use  . Smoking status: Current Every Day Smoker    Packs/day: 0.50    Years: 34.00    Pack years: 17.00    Types: Cigarettes  . Smokeless tobacco: Never Used  Substance and Sexual Activity  . Alcohol use: No    Alcohol/week: 0.0 oz  . Drug use: No  . Sexual activity: Yes    Partners: Female    Birth control/protection: Pill  Lifestyle  . Physical activity:    Days per week: Not on file    Minutes per session: Not on file  . Stress: Not on file  Relationships  . Social connections:    Talks on phone: Not on file    Gets together: Not on file    Attends religious service: Not on file    Active member of club or organization: Not on file    Attends meetings of clubs or organizations: Not on file    Relationship status: Not on file  Other Topics Concern  . Not on file  Social History Narrative   Drinks Coffee 2 cups daily.  Lives sat home  with wife.   Is self employed.  Graduated from High school.     Outpatient Encounter Medications as of 05/07/2017  Medication Sig  . acetaminophen (TYLENOL) 500 MG tablet Take 1,000 mg by mouth every 6 (six) hours as needed.  Marland Kitchen aspirin 325 MG EC tablet Take 325 mg by mouth daily.    Marland Kitchen atorvastatin (LIPITOR) 40 MG tablet Take 1 tablet (40 mg total) by mouth daily at 6 PM.  . clopidogrel (PLAVIX) 75 MG tablet TAKE ONE TABLET BY MOUTH ONCE DAILY.  Marland Kitchen Continuous Blood Gluc Receiver (FREESTYLE LIBRE 14 DAY READER) DEVI 1 each by Does not apply route every 14 (fourteen) days.  . Continuous Blood Gluc Sensor (FREESTYLE LIBRE 14 DAY SENSOR) MISC 1 each by Does not apply route every 14 (fourteen) days.  . Continuous Blood Gluc Sensor (FREESTYLE LIBRE SENSOR SYSTEM) MISC USE 1 SENSOR TO CHECK GLUCOSE -- CHANGE EVERY 10 DAYS  .  gemfibrozil (LOPID) 600 MG tablet Take 1 tablet (600 mg total) by mouth 2 (two) times daily before a meal.  . glucose blood (BAYER CONTOUR TEST) test strip Use as instructed 4 x daily E11.65. Contour plus test strips  . insulin aspart (NOVOLOG FLEXPEN) 100 UNIT/ML FlexPen Inject 12-18 Units into the skin 3 (three) times daily with meals.  . insulin degludec (TRESIBA FLEXTOUCH) 100 UNIT/ML SOPN FlexTouch Pen Inject 0.5 mLs (50 Units total) into the skin daily at 10 pm.  . nicotine (NICODERM CQ - DOSED IN MG/24 HOURS) 21 mg/24hr patch Place 1 patch (21 mg total) onto the skin daily.  Marland Kitchen NITROSTAT 0.4 MG SL tablet DISSOLVE 1 TABLET UNDER TONGUE EVERY 5 MINUTES UP TO 15 MIN FOR CHESTPAIN. IF NO RELIEF CALL 911.  . pantoprazole (PROTONIX) 40 MG tablet Take 1 tablet by mouth daily.   . traMADol (ULTRAM) 50 MG tablet Take 1 tablet (50 mg total) by mouth every 6 (six) hours as needed for moderate pain.  . [DISCONTINUED] Continuous Blood Gluc Sensor (FREESTYLE LIBRE 14 DAY SENSOR) MISC 1 each by Does not apply route every 14 (fourteen) days.  . [DISCONTINUED] LEVEMIR FLEXTOUCH 100 UNIT/ML Pen INJECT 50 UNITS SUBCUTANEOUSLY AT BEDTIME.  . [DISCONTINUED] topiramate (TOPAMAX) 25 MG tablet Take 25 mg by mouth daily.    No facility-administered encounter medications on file as of 05/07/2017.    ALLERGIES: No Known Allergies VACCINATION STATUS:  There is no immunization history on file for this patient.  Diabetes  He presents for his follow-up diabetic visit. He has type 2 diabetes mellitus. Onset time: He was diagnosed at approximate age of 20 years. In this particular patient with a history of heavy alcohol use possiblity of pancreatic diabetes is high. His disease course has been stable. Pertinent negatives for hypoglycemia include no confusion, headaches, pallor, seizures or speech difficulty. Pertinent negatives for diabetes include no chest pain, no fatigue, no polydipsia, no polyphagia, no polyuria and no  weakness. There are no hypoglycemic complications. Symptoms are improving. Diabetic complications include a CVA and heart disease. Risk factors for coronary artery disease include diabetes mellitus, dyslipidemia, hypertension, male sex, sedentary lifestyle and tobacco exposure. Current diabetic treatment includes oral agent (dual therapy). He is compliant with treatment some of the time. His weight is increasing steadily. He is following a generally unhealthy diet. When asked about meal planning, he reported none. He has not had a previous visit with a dietitian (he did not keep appointment.). He rarely participates in exercise. His home blood glucose trend is fluctuating minimally.  His breakfast blood glucose range is generally 140-180 mg/dl. His lunch blood glucose range is generally 140-180 mg/dl. His dinner blood glucose range is generally 140-180 mg/dl. His bedtime blood glucose range is generally 140-180 mg/dl. His overall blood glucose range is 140-180 mg/dl. Eye exam is current.  Hyperlipidemia  This is a chronic problem. The current episode started more than 1 year ago. Exacerbating diseases include diabetes. Pertinent negatives include no chest pain, myalgias or shortness of breath. Current antihyperlipidemic treatment includes statins. Risk factors for coronary artery disease include dyslipidemia, diabetes mellitus, hypertension and a sedentary lifestyle.  Hypertension  This is a chronic problem. The current episode started more than 1 year ago. The problem is controlled. Pertinent negatives include no chest pain, headaches, neck pain, palpitations or shortness of breath. Risk factors for coronary artery disease include diabetes mellitus, dyslipidemia and smoking/tobacco exposure. Past treatments include beta blockers. Hypertensive end-organ damage includes CAD/MI and CVA.    Review of Systems  Constitutional: Negative for fatigue and unexpected weight change.  HENT: Negative for dental problem,  mouth sores and trouble swallowing.   Eyes: Negative for visual disturbance.  Respiratory: Negative for cough, choking, chest tightness, shortness of breath and wheezing.   Cardiovascular: Negative for chest pain, palpitations and leg swelling.  Gastrointestinal: Negative for abdominal distention, abdominal pain, constipation, diarrhea, nausea and vomiting.  Endocrine: Negative for polydipsia, polyphagia and polyuria.  Genitourinary: Negative for dysuria, flank pain, hematuria and urgency.  Musculoskeletal: Negative for back pain, gait problem, myalgias and neck pain.  Skin: Negative for pallor, rash and wound.  Neurological: Negative for seizures, syncope, speech difficulty, weakness, numbness and headaches.  Psychiatric/Behavioral: Negative for confusion and dysphoric mood.    Objective:    BP 128/76   Pulse 93   Ht 5\' 9"  (1.753 m)   Wt 163 lb (73.9 kg)   BMI 24.07 kg/m   Wt Readings from Last 3 Encounters:  05/07/17 163 lb (73.9 kg)  04/21/17 160 lb (72.6 kg)  09/30/16 161 lb (73 kg)    Physical Exam  Constitutional: He is oriented to person, place, and time. He appears well-developed. He is cooperative. No distress.  HENT:  Head: Normocephalic and atraumatic.  Eyes: EOM are normal.  Neck: Normal range of motion. Neck supple. No tracheal deviation present. No thyromegaly present.  Cardiovascular: Normal rate, S1 normal and S2 normal. Exam reveals no gallop.  No murmur heard. Pulses:      Dorsalis pedis pulses are 1+ on the right side, and 1+ on the left side.       Posterior tibial pulses are 1+ on the right side, and 1+ on the left side.  Pulmonary/Chest: No respiratory distress. He has no wheezes.  Abdominal: He exhibits no distension. There is no tenderness. There is no guarding and no CVA tenderness.  Musculoskeletal: He exhibits no edema.       Right shoulder: He exhibits no swelling and no deformity.  Neurological: He is alert and oriented to person, place, and time.  He has normal strength and normal reflexes. No cranial nerve deficit or sensory deficit. Gait normal.  Skin: Skin is warm and dry. No rash noted. No cyanosis. Nails show no clubbing.  Psychiatric: He has a normal mood and affect. His speech is normal. Cognition and memory are normal.   passive and unconcerned affect.    Results for orders placed or performed in visit on 04/07/17  Comprehensive metabolic panel  Result Value Ref Range   Glucose, Bld 169 (  H) 65 - 99 mg/dL   BUN 12 7 - 25 mg/dL   Creat 0.64 (L) 0.70 - 1.33 mg/dL   BUN/Creatinine Ratio 19 6 - 22 (calc)   Sodium 140 135 - 146 mmol/L   Potassium 4.2 3.5 - 5.3 mmol/L   Chloride 108 98 - 110 mmol/L   CO2 26 20 - 32 mmol/L   Calcium 9.3 8.6 - 10.3 mg/dL   Total Protein 6.3 6.1 - 8.1 g/dL   Albumin 4.1 3.6 - 5.1 g/dL   Globulin 2.2 1.9 - 3.7 g/dL (calc)   AG Ratio 1.9 1.0 - 2.5 (calc)   Total Bilirubin 0.5 0.2 - 1.2 mg/dL   Alkaline phosphatase (APISO) 69 40 - 115 U/L   AST 12 10 - 35 U/L   ALT 14 9 - 46 U/L  Hemoglobin A1c  Result Value Ref Range   Hgb A1c MFr Bld 8.9 (H) <5.7 % of total Hgb   Mean Plasma Glucose 209 (calc)   eAG (mmol/L) 11.6 (calc)   Diabetic Labs (most recent): Lab Results  Component Value Date   HGBA1C 8.9 (H) 04/20/2017   HGBA1C 8.5 (H) 08/25/2016   HGBA1C 8.2 (H) 05/02/2016   Lipid Panel     Component Value Date/Time   CHOL 212 (H) 02/10/2016 0554   TRIG 347 (H) 02/10/2016 0554   HDL 24 (L) 02/10/2016 0554   CHOLHDL 8.8 02/10/2016 0554   VLDL 69 (H) 02/10/2016 0554   LDLCALC 119 (H) 02/10/2016 0554      Assessment & Plan:   1. Type 2 diabetes mellitus with vascular disease (HCC) -His diabetes is  complicated by coronary artery disease and patient remains at a high risk for more acute and chronic complications of diabetes which include CAD, CVA, CKD, retinopathy, and neuropathy. These are all discussed in detail with the patient.  -- He returns with better engagement and improving  glycemic profile   -  his recent A1c has increased to 8.9% from  8.2%, and has history of noncompliance/nonadherence.  -  He wears the continuous glucose monitoring device this time, ever he says it helped him to stay engaged.    - I have re-counseled the patient on diet management  by adopting a carbohydrate restricted / protein rich  Diet.  -  Suggestion is made for him to avoid simple carbohydrates  from his diet including Cakes, Sweet Desserts / Pastries, Ice Cream, Soda (diet and regular), Sweet Tea, Candies, Chips, Cookies, Store Bought Juices, Alcohol in Excess of  1-2 drinks a day, Artificial Sweeteners, and "Sugar-free" Products. This will help patient to have stable blood glucose profile and potentially avoid unintended weight gain.  - Patient is advised to stick to a routine mealtimes to eat 3 meals  a day and avoid unnecessary snacks ( to snack only to correct hypoglycemia).  - I have approached patient with the following individualized plan to manage diabetes and patient agrees.  - Proper use of insulin is the  exclusive choice he has to treat his diabetes. - He wears the continuous glucose monitoring device Libre which helped significantly. -He did not cover blood glucose readings between 90-150 even when he is eating, I discussed this air with him.     -I advised him to continue Levemir  50 units at bedtime (will switch to [place] next prescription), T2 NovoLog 12 units 3 times daily before meals for pre-meal blood glucose above 90 mg/dL. - He will use an additional correction dose of NovoLog  for blood glucose readings above 150 mg/dL. -He is encouraged to call clinic for blood glucose levels less than 70 or above 200 mg /dl. - He is not the right candidate for Invokana nor incretintherapy.  -Target numbers for A1c, LDL, HDL, Triglycerides, Waist Circumference were discussed in detail.   2) BP/HTN: His blood pressure is controlled to target.     Continue current medications  including beta blockers. He has marginal blood pressure would not tolerate additional ACE inhibitors.  3) Lipids/HPL: His lipid panel shows uncontrolled LDL at 119, slightly improving from 133 , I advised him to continue statins.  4)  Weight/Diet:   He is  following with  CDE consult, exercise, and carbohydrates information provided.  5) Chronic Care/Health Maintenance:  -Patient is  Statin medications and encouraged to continue to follow up with Ophthalmology, Podiatrist at least yearly or according to recommendations, and advised to  quit Smoking (he says he stopped smoking since he was diagnosed with a stroke). I have recommended yearly flu vaccine and pneumonia vaccination at least every 5 years; moderate intensity exercise for up to 150 minutes weekly; and  sleep for at least 7 hours a day.  - He has a chronic heavy smoker , cannot exercise optimally.  I advised patient to maintain close follow up with his PCP for primary care needs. - Time spent with the patient: 25 min, of which >50% was spent in reviewing his blood glucose logs , discussing his hypo- and hyper-glycemic episodes, reviewing his current and  previous labs and insulin doses and developing a plan to avoid hypo- and hyper-glycemia. Please refer to Patient Instructions for Blood Glucose Monitoring and Insulin/Medications Dosing Guide"  in media tab for additional information. [name] participated in the discussions, expressed understanding, and voiced agreement with the above plans.  All questions were answered to his satisfaction. he is encouraged to contact clinic should he have any questions or concerns prior to his return visit.  Follow up plan: Return in about 3 months (around 08/06/2017) for meter, and logs.  Glade Lloyd, MD Phone: [phone]  Fax: [phone]  This note was partially dictated with voice recognition software. Similar sounding words can be transcribed inadequately or may not  be corrected upon  review.  05/07/2017, 12:31 PM

## 2017-05-08 ENCOUNTER — Telehealth: Payer: Self-pay | Admitting: Cardiology

## 2017-05-08 NOTE — Telephone Encounter (Signed)
Monitor updated. Patient aware.

## 2017-05-08 NOTE — Telephone Encounter (Signed)
New Message     1. Has your device fired?  no  2. Is you device beeping?  no  3. Are you experiencing draining or swelling at device site? no  4. Are you calling to see if we received your device transmission? Did you receive the transmission , his monitor is not lighting up   5. Have you passed out? no    Please route to [place]

## 2017-05-13 ENCOUNTER — Ambulatory Visit (INDEPENDENT_AMBULATORY_CARE_PROVIDER_SITE_OTHER): Payer: 59 | Admitting: *Deleted

## 2017-05-13 DIAGNOSIS — I639 Cerebral infarction, unspecified: Secondary | ICD-10-CM

## 2017-05-14 NOTE — Progress Notes (Signed)
[facility]link Summary Report / Loop Recorder 

## 2017-05-16 LAB — CUP PACEART REMOTE DEVICE CHECK
MDC IDC PG IMPLANT DT: 20180116
MDC IDC SESS DTM: 20190315194101

## 2017-06-12 LAB — CUP PACEART REMOTE DEVICE CHECK
Implantable Pulse Generator Implant Date: 20180116
MDC IDC SESS DTM: 20190417193807

## 2017-06-15 ENCOUNTER — Ambulatory Visit (INDEPENDENT_AMBULATORY_CARE_PROVIDER_SITE_OTHER): Payer: 59 | Admitting: *Deleted

## 2017-06-15 DIAGNOSIS — I639 Cerebral infarction, unspecified: Secondary | ICD-10-CM

## 2017-06-16 NOTE — Progress Notes (Signed)
[facility]link Summary Report / Loop Recorder 

## 2017-07-06 LAB — CUP PACEART REMOTE DEVICE CHECK
Date Time Interrogation Session: 20190520201043
MDC IDC PG IMPLANT DT: 20180116

## 2017-07-07 NOTE — Progress Notes (Signed)
Cardiology Office Note  Date: 07/08/2017   ID: Nicholas [name], DOB 1960/03/22, MRN [id number]  PCP: Nicholas Sites, MD  Primary Cardiologist: Nicholas Lesches, MD   Chief Complaint  Patient presents with  . Coronary Artery Disease    History of Present Illness: Nicholas [name] is a 57 y.o. male last seen in August 2018.  He is here for a routine visit.  States that he feels well, no angina symptoms or nitroglycerin use.  Continues to work full-time.  He has NYHA class II dyspnea with typical activities, no palpitations or syncope.  Patient has an ILR in place, followed by Nicholas [name].  Recent device interrogation showed no atrial arrhythmias.  We discussed this today.  I personally reviewed his ECG today which shows sinus rhythm with incomplete right bundle branch block.  I reviewed his medications which are outlined below.  He remains on aspirin, Plavix, Lipitor, Lopid, and as needed nitroglycerin.  He has not had a recent lipid panel or LFTs.  Past Medical History:  Diagnosis Date  . Anal lesion    Anal papilla/tags - external lesion  . Colon polyp    Hyperplastic rectosigmoid polyp 9/08  . Coronary atherosclerosis of native coronary artery    DES to PLB, BMS to mid and distal RCA, DES to circumflex and distal LAD, 6/10 (5 stents)  . GERD (gastroesophageal reflux disease)   . Helicobacter pylori (H. pylori) 09/2009   Treated with helidac  . Hiatal hernia   . Hyperplastic colon polyp 2008  . Mixed hyperlipidemia   . Myocardial infarction ([place]) 06/2008  . Stroke [facility])    January 2018  . Type 2 diabetes mellitus ([place])     Past Surgical History:  Procedure Laterality Date  . CHOLECYSTECTOMY    . COLLAPSED LUNG     15 years ago  . COLONOSCOPY  10/12/2006   Dr. Gala Romney- rectosigmoid polyp s/phot snare polypectomy o/w normal rectum and terminal ileum. hyperplastic polyp on bx  . EP IMPLANTABLE DEVICE N/A 02/12/2016   Procedure: Loop Recorder Insertion;  Surgeon: Thompson Grayer, MD;  Location: [facility];  Service: Cardiovascular;  Laterality: N/A;  . ESOPHAGOGASTRODUODENOSCOPY  10/12/2006   Dr. Gala Romney- examination of the tubular esophagus revealed no mucosal abnormalities. the EG junction was easily traversed. small hiatal hernia, the gastric mucosa o/w appeared normal. there was no infiltrating process or frank ulcer seen.  . TEE WITHOUT CARDIOVERSION N/A 02/12/2016   Procedure: TRANSESOPHAGEAL ECHOCARDIOGRAM (TEE);  Surgeon: Jerline Pain, MD;  Location: [facility] ENDOSCOPY;  Service: Cardiovascular;  Laterality: N/A;  . WIRE IN APEX OF RIGHT LUNG     Since childhood    Current Outpatient Medications  Medication Sig Dispense Refill  . acetaminophen (TYLENOL) 500 MG tablet Take 1,000 mg by mouth every 6 (six) hours as needed.    Marland Kitchen atorvastatin (LIPITOR) 40 MG tablet Take 1 tablet (40 mg total) by mouth daily at 6 PM. 30 tablet 0  . clopidogrel (PLAVIX) 75 MG tablet TAKE ONE TABLET BY MOUTH ONCE DAILY. 30 tablet 6  . Continuous Blood Gluc Receiver (FREESTYLE LIBRE 14 DAY READER) DEVI 1 each by Does not apply route every 14 (fourteen) days. 1 Device 0  . Continuous Blood Gluc Sensor (FREESTYLE LIBRE 14 DAY SENSOR) MISC 1 each by Does not apply route every 14 (fourteen) days. 3 each 5  . Continuous Blood Gluc Sensor (FREESTYLE LIBRE SENSOR SYSTEM) MISC USE 1 SENSOR TO CHECK GLUCOSE -- CHANGE EVERY 10  DAYS 3 each 2  . gemfibrozil (LOPID) 600 MG tablet Take 1 tablet (600 mg total) by mouth 2 (two) times daily before a meal. 60 tablet 3  . glucose blood (BAYER CONTOUR TEST) test strip Use as instructed 4 x daily E11.65. Contour plus test strips 150 each 5  . insulin aspart (NOVOLOG FLEXPEN) 100 UNIT/ML FlexPen Inject 12-18 Units into the skin 3 (three) times daily with meals. 5 pen 2  . insulin degludec (TRESIBA FLEXTOUCH) 100 UNIT/ML SOPN FlexTouch Pen Inject 0.5 mLs (50 Units total) into the skin daily at 10 pm. 5 pen 2  . nicotine (NICODERM CQ - DOSED IN MG/24  HOURS) 21 mg/24hr patch Place 1 patch (21 mg total) onto the skin daily. 14 patch 0  . NITROSTAT 0.4 MG SL tablet DISSOLVE 1 TABLET UNDER TONGUE EVERY 5 MINUTES UP TO 15 MIN FOR CHESTPAIN. IF NO RELIEF CALL 911. 25 tablet 1  . pantoprazole (PROTONIX) 40 MG tablet Take 1 tablet by mouth daily.     . traMADol (ULTRAM) 50 MG tablet Take 1 tablet (50 mg total) by mouth every 6 (six) hours as needed for moderate pain. 15 tablet 0  . aspirin EC 81 MG tablet Take 1 tablet (81 mg total) by mouth daily. 90 tablet 3   No current facility-administered medications for this visit.    Allergies:  Patient has no known allergies.   Social History: The patient  reports that he has been smoking cigarettes.  He started smoking about 44 years ago. He has a 17.00 pack-year smoking history. He has never used smokeless tobacco. He reports that he does not drink alcohol or use drugs.   ROS:  Please see the history of present illness. Otherwise, complete review of systems is positive for none.  All other systems are reviewed and negative.   Physical Exam: VS:  BP 120/80   Pulse 87   Ht 5\' 9"  (1.753 m)   Wt 162 lb 9.6 oz (73.8 kg)   SpO2 94%   BMI 24.01 kg/m , BMI Body mass index is 24.01 kg/m.  Wt Readings from Last 3 Encounters:  07/08/17 162 lb 9.6 oz (73.8 kg)  05/07/17 163 lb (73.9 kg)  04/21/17 160 lb (72.6 kg)    General: Patient appears comfortable at rest. HEENT: Conjunctiva and lids normal, oropharynx clear. Neck: Supple, no elevated JVP or carotid bruits, no thyromegaly. Lungs: Clear to auscultation, nonlabored breathing at rest. Cardiac: Regular rate and rhythm, no S3 or significant systolic murmur, no pericardial rub. Abdomen: Soft, nontender, bowel sounds present. Extremities: No pitting edema, distal pulses 2+. Skin: Warm and dry. Musculoskeletal: No kyphosis. Neuropsychiatric: Alert and oriented x3, affect grossly appropriate.  ECG: I personally reviewed the tracing from 02/09/2016 which  showed sinus tachycardia with incomplete right bundle branch block.  Recent Labwork: 04/20/2017: ALT 14; AST 12; BUN 12; Creat 0.64; Potassium 4.2; Sodium 140     Component Value Date/Time   CHOL 212 (H) 02/10/2016 0554   TRIG 347 (H) 02/10/2016 0554   HDL 24 (L) 02/10/2016 0554   CHOLHDL 8.8 02/10/2016 0554   VLDL 69 (H) 02/10/2016 0554   LDLCALC 119 (H) 02/10/2016 0554    Other Studies Reviewed Today:  TEE 02/12/2016: Study Conclusions  - Left ventricle: The cavity size was normal. Wall thickness was normal. Systolic function was normal. The estimated ejection fraction was in the range of 60% to 65%. Wall motion was normal; there were no regional wall motion abnormalities. - Aortic  valve: No evidence of vegetation. - Mitral valve: No evidence of vegetation. - Left atrium: No evidence of thrombus in the atrial cavity or appendage. No evidence of thrombus in the appendage. - Right atrium: No evidence of thrombus in the atrial cavity or appendage. - Atrial septum: No defect or patent foramen ovale was identified. Echo contrast study showed no right-to-left atrial level shunt, following an increase in RA pressure induced by provocative maneuvers. Late bubbles (trivial amount) were present and likely represent small non clinical intrapulmonary shunt. - Tricuspid valve: No evidence of vegetation. - Pulmonic valve: No evidence of vegetation. - Superior vena cava: The study excluded a thrombus.  Impressions:  - No cardiac source of emboli was indentified.  Lexiscan Cardiolite 07/24/2014:  There was no ST segment deviation noted during stress.  Findings consistent with prior inferolatral myocardial infarction with mild peri-infarct ischemia.  This is a low risk study.  The left ventricular ejection fraction is normal (55-65%).  Assessment and Plan:  1.  Multivessel CAD status post PCI in 2010.  He is stable without active angina on medical therapy and  we continue with observation.  I reviewed his ECG.  Last ischemic work-up was in 2016.  2.  History of stroke in January 2018.  ILR in place shows no atrial arrhythmias.  He remains on aspirin and Plavix.  Aspirin dose will be reduced to 81 mg daily.  3.  Mixed hyperlipidemia, remains on Lipitor.  Follow-up FLP and LFTs.  4.  Type 2 diabetes mellitus, on insulin.  He continues to follow with Dr. Hilma Favors.  Current medicines were reviewed with the patient today.   Orders Placed This Encounter  Procedures  . Hepatic function panel  . Lipid panel  . EKG 12-Lead    Disposition: Follow-up in 6 months.  Signed, Satira Sark, MD, [facility] 07/08/2017 3:35 PM    [facility] at [facility] [address], [place], [place] [address] Phone: [phone]; Fax: [phone]

## 2017-07-08 ENCOUNTER — Encounter: Payer: Self-pay | Admitting: Cardiology

## 2017-07-08 ENCOUNTER — Ambulatory Visit: Payer: 59 | Admitting: Cardiology

## 2017-07-08 VITALS — BP 120/80 | HR 87 | Ht 69.0 in | Wt 162.6 lb

## 2017-07-08 DIAGNOSIS — Z8673 Personal history of transient ischemic attack (TIA), and cerebral infarction without residual deficits: Secondary | ICD-10-CM | POA: Diagnosis not present

## 2017-07-08 DIAGNOSIS — Z5181 Encounter for therapeutic drug level monitoring: Secondary | ICD-10-CM | POA: Diagnosis not present

## 2017-07-08 DIAGNOSIS — Z794 Long term (current) use of insulin: Secondary | ICD-10-CM | POA: Diagnosis not present

## 2017-07-08 DIAGNOSIS — E118 Type 2 diabetes mellitus with unspecified complications: Secondary | ICD-10-CM | POA: Diagnosis not present

## 2017-07-08 DIAGNOSIS — E782 Mixed hyperlipidemia: Secondary | ICD-10-CM

## 2017-07-08 DIAGNOSIS — I2510 Atherosclerotic heart disease of native coronary artery without angina pectoris: Secondary | ICD-10-CM | POA: Diagnosis not present

## 2017-07-08 MED ORDER — ASPIRIN EC 81 MG PO TBEC: 81.0000 mg | DELAYED_RELEASE_TABLET | Freq: Every day | ORAL | 3 refills | Status: DC

## 2017-07-08 NOTE — Patient Instructions (Addendum)
Medication Instructions:   Your physician has recommended you make the following change in your medication:   Decrease aspirin to 81 mg by mouth daily.  Continue all other medications the same.  Labwork:  Your physician recommends that you return for a FASTING lipid/liver profile. Please make sure you are fasting at least 8 hours.   Testing/Procedures:  NONE  Follow-Up:  Your physician recommends that you schedule a follow-up appointment in: 6 months. You will receive a reminder letter in the mail in about 4 months reminding you to call and schedule your appointment. If you don't receive this letter, please contact our office.  Any Other Special Instructions Will Be Listed Below (If Applicable).  If you need a refill on your cardiac medications before your next appointment, please call your pharmacy.

## 2017-07-20 ENCOUNTER — Ambulatory Visit (INDEPENDENT_AMBULATORY_CARE_PROVIDER_SITE_OTHER): Payer: 59 | Admitting: *Deleted

## 2017-07-20 DIAGNOSIS — I639 Cerebral infarction, unspecified: Secondary | ICD-10-CM | POA: Diagnosis not present

## 2017-07-20 NOTE — Progress Notes (Signed)
[facility]link Summary Report / Loop Recorder 

## 2017-08-07 ENCOUNTER — Ambulatory Visit: Payer: 59 | Admitting: "Endocrinology

## 2017-08-10 ENCOUNTER — Other Ambulatory Visit: Payer: Self-pay | Admitting: Cardiology

## 2017-08-20 ENCOUNTER — Ambulatory Visit (INDEPENDENT_AMBULATORY_CARE_PROVIDER_SITE_OTHER): Payer: 59 | Admitting: *Deleted

## 2017-08-20 DIAGNOSIS — I639 Cerebral infarction, unspecified: Secondary | ICD-10-CM | POA: Diagnosis not present

## 2017-08-21 NOTE — Progress Notes (Signed)
[facility]link Summary Report / Loop Recorder 

## 2017-08-27 ENCOUNTER — Other Ambulatory Visit: Payer: Self-pay | Admitting: "Endocrinology

## 2017-08-27 ENCOUNTER — Other Ambulatory Visit: Payer: Self-pay | Admitting: Cardiology

## 2017-09-01 LAB — LIPID PANEL
Cholesterol: 183 mg/dL (ref <200)
HDL: 29 mg/dL — AB (ref >40)
LDL Cholesterol (Calc): 116 mg/dL (calc) — ABNORMAL HIGH
Non-HDL Cholesterol (Calc): 154 mg/dL (calc) — ABNORMAL HIGH (ref <130)
TRIGLYCERIDES: 266 mg/dL — AB (ref <150)
Total CHOL/HDL Ratio: 6.3 (calc) — ABNORMAL HIGH (ref <5.0)

## 2017-09-01 LAB — CUP PACEART REMOTE DEVICE CHECK
MDC IDC PG IMPLANT DT: 20180116
MDC IDC SESS DTM: 20190622214014

## 2017-09-02 ENCOUNTER — Telehealth: Payer: Self-pay | Admitting: *Deleted

## 2017-09-02 DIAGNOSIS — E785 Hyperlipidemia, unspecified: Secondary | ICD-10-CM

## 2017-09-02 LAB — COMPLETE METABOLIC PANEL WITH GFR
AG RATIO: 1.7 (calc) (ref 1.0–2.5)
ALKALINE PHOSPHATASE (APISO): 69 U/L (ref 40–115)
ALT: 14 U/L (ref 9–46)
AST: 14 U/L (ref 10–35)
Albumin: 4.1 g/dL (ref 3.6–5.1)
BILIRUBIN TOTAL: 0.4 mg/dL (ref 0.2–1.2)
BUN: 16 mg/dL (ref 7–25)
CHLORIDE: 105 mmol/L (ref 98–110)
CO2: 27 mmol/L (ref 20–32)
Calcium: 9 mg/dL (ref 8.6–10.3)
Creat: 0.74 mg/dL (ref 0.70–1.33)
GFR, Est African American: 118 mL/min/{1.73_m2} (ref >=60)
GFR, Est Non African American: 102 mL/min/{1.73_m2} (ref >=60)
Globulin: 2.4 g/dL (calc) (ref 1.9–3.7)
Glucose, Bld: 194 mg/dL — ABNORMAL HIGH (ref 65–99)
POTASSIUM: 4.6 mmol/L (ref 3.5–5.3)
Sodium: 138 mmol/L (ref 135–146)
Total Protein: 6.5 g/dL (ref 6.1–8.1)

## 2017-09-02 LAB — HEMOGLOBIN A1C
EAG (MMOL/L): 9 (calc)
Hgb A1c MFr Bld: 7.3 % of total Hgb — ABNORMAL HIGH (ref <5.7)
MEAN PLASMA GLUCOSE: 163 (calc)

## 2017-09-02 MED ORDER — ATORVASTATIN CALCIUM 40 MG PO TABS: 40.0000 mg | ORAL_TABLET | Freq: Every day | ORAL | 3 refills | Status: DC

## 2017-09-02 NOTE — Telephone Encounter (Signed)
Spoke with Event organiser at [facility] regarding if pt has filled Atorvastatin 40 mg recently. Last fill date is in 2018.

## 2017-09-02 NOTE — Telephone Encounter (Signed)
Pt notified. Orders placed.Lab sheet mailed

## 2017-09-02 NOTE — Telephone Encounter (Signed)
Please plan to restart Atorvastatin 40 mg daily and recheck FLP and LFTs in 6 to 8 weeks.  Thanks,  Erma Heritage, PA-C 09/02/2017, 1:03 PM Pager: [phone]

## 2017-09-02 NOTE — Telephone Encounter (Signed)
-----   Message from Acquanetta Chain, LPN sent at 06/29/9474  9:37 AM EDT -----   ----- Message ----- From: Erma Heritage, PA-C Sent: 09/02/2017   7:32 AM To: Acquanetta Chain, LPN  Covering for Dr. Domenic Polite - Please let the patient know that his cholesterol levels remain elevated.  His LDL is at 116 and we prefer for this to be less than 70 in the setting of known CAD and prior CVA. Liver function remains within normal limits. He is listed as being on Atorvastatin 40mg  daily. Please confirm that he is actually taking this. If he has been compliant, would recommend titration of Atorvastatin to 80 mg daily and repeat FLP/LFT's in 6-8 weeks for reassessment.

## 2017-09-03 ENCOUNTER — Ambulatory Visit (INDEPENDENT_AMBULATORY_CARE_PROVIDER_SITE_OTHER): Payer: 59 | Admitting: "Endocrinology

## 2017-09-03 ENCOUNTER — Encounter: Payer: Self-pay | Admitting: "Endocrinology

## 2017-09-03 VITALS — BP 115/74 | HR 90 | Ht 69.0 in | Wt 162.0 lb

## 2017-09-03 DIAGNOSIS — E1159 Type 2 diabetes mellitus with other circulatory complications: Secondary | ICD-10-CM

## 2017-09-03 DIAGNOSIS — I10 Essential (primary) hypertension: Secondary | ICD-10-CM

## 2017-09-03 DIAGNOSIS — E782 Mixed hyperlipidemia: Secondary | ICD-10-CM | POA: Diagnosis not present

## 2017-09-03 NOTE — Progress Notes (Signed)
Endocrinology follow-up note  Subjective:    Patient ID: Nicholas [name], male    DOB: 12/20/1960,    Past Medical History:  Diagnosis Date  . Anal lesion    Anal papilla/tags - external lesion  . Colon polyp    Hyperplastic rectosigmoid polyp 9/08  . Coronary atherosclerosis of native coronary artery    DES to PLB, BMS to mid and distal RCA, DES to circumflex and distal LAD, 6/10 (5 stents)  . GERD (gastroesophageal reflux disease)   . Helicobacter pylori (H. pylori) 09/2009   Treated with helidac  . Hiatal hernia   . Hyperplastic colon polyp 2008  . Mixed hyperlipidemia   . Myocardial infarction ([place]) 06/2008  . Stroke [facility])    January 2018  . Type 2 diabetes mellitus ([place])    Past Surgical History:  Procedure Laterality Date  . CHOLECYSTECTOMY    . COLLAPSED LUNG     15 years ago  . COLONOSCOPY  10/12/2006   Dr. Gala Romney- rectosigmoid polyp s/phot snare polypectomy o/w normal rectum and terminal ileum. hyperplastic polyp on bx  . EP IMPLANTABLE DEVICE N/A 02/12/2016   Procedure: Loop Recorder Insertion;  Surgeon: Thompson Grayer, MD;  Location: [facility];  Service: Cardiovascular;  Laterality: N/A;  . ESOPHAGOGASTRODUODENOSCOPY  10/12/2006   Dr. Gala Romney- examination of the tubular esophagus revealed no mucosal abnormalities. the EG junction was easily traversed. small hiatal hernia, the gastric mucosa o/w appeared normal. there was no infiltrating process or frank ulcer seen.  . TEE WITHOUT CARDIOVERSION N/A 02/12/2016   Procedure: TRANSESOPHAGEAL ECHOCARDIOGRAM (TEE);  Surgeon: Jerline Pain, MD;  Location: [facility] ENDOSCOPY;  Service: Cardiovascular;  Laterality: N/A;  . WIRE IN APEX OF RIGHT LUNG     Since childhood   Social History   Socioeconomic History  . Marital status: Married    Spouse name: Not on file  . Number of children: Not on file  . Years of education: Not on file  . Highest education level: Not on file  Occupational History  . Occupation: maintenance,  parking lots    Employer: SELF-EMPLOYED  Social Needs  . Financial resource strain: Not on file  . Food insecurity:    Worry: Not on file    Inability: Not on file  . Transportation needs:    Medical: Not on file    Non-medical: Not on file  Tobacco Use  . Smoking status: Current Every Day Smoker    Packs/day: 0.50    Years: 34.00    Pack years: 17.00    Types: Cigarettes    Start date: 04/08/1973  . Smokeless tobacco: Never Used  Substance and Sexual Activity  . Alcohol use: No    Alcohol/week: 0.0 standard drinks  . Drug use: No  . Sexual activity: Yes    Partners: Female    Birth control/protection: Pill  Lifestyle  . Physical activity:    Days per week: Not on file    Minutes per session: Not on file  . Stress: Not on file  Relationships  . Social connections:    Talks on phone: Not on file    Gets together: Not on file    Attends religious service: Not on file    Active member of club or organization: Not on file    Attends meetings of clubs or organizations: Not on file    Relationship status: Not on file  Other Topics Concern  . Not on file  Social History Narrative  Drinks Coffee 2 cups daily.  Lives sat home with wife.   Is self employed.  Graduated from High school.     Outpatient Encounter Medications as of 09/03/2017  Medication Sig  . acetaminophen (TYLENOL) 500 MG tablet Take 1,000 mg by mouth every 6 (six) hours as needed.  Marland Kitchen aspirin EC 81 MG tablet Take 1 tablet (81 mg total) by mouth daily.  Marland Kitchen atorvastatin (LIPITOR) 40 MG tablet Take 1 tablet (40 mg total) by mouth daily.  . BD PEN NEEDLE NANO U/F 32G X 4 MM MISC USE AS DIRECTED FOUR TIMES DAILY.  Marland Kitchen clopidogrel (PLAVIX) 75 MG tablet TAKE (1) TABLET BY MOUTH ONCE DAILY.  Marland Kitchen Continuous Blood Gluc Receiver (FREESTYLE LIBRE 14 DAY READER) DEVI 1 each by Does not apply route every 14 (fourteen) days.  . Continuous Blood Gluc Sensor (FREESTYLE LIBRE 14 DAY SENSOR) MISC 1 each by Does not apply route every 14  (fourteen) days.  . Continuous Blood Gluc Sensor (FREESTYLE LIBRE SENSOR SYSTEM) MISC USE 1 SENSOR TO CHECK GLUCOSE -- CHANGE EVERY 10 DAYS  . gemfibrozil (LOPID) 600 MG tablet Take 1 tablet (600 mg total) by mouth 2 (two) times daily before a meal.  . glucose blood (BAYER CONTOUR TEST) test strip Use as instructed 4 x daily E11.65. Contour plus test strips  . insulin aspart (NOVOLOG FLEXPEN) 100 UNIT/ML FlexPen Inject 12-18 Units into the skin 3 (three) times daily with meals.  . nicotine (NICODERM CQ - DOSED IN MG/24 HOURS) 21 mg/24hr patch Place 1 patch (21 mg total) onto the skin daily.  . nitroGLYCERIN (NITROSTAT) 0.4 MG SL tablet DISSOLVE 1 TABLET UNDER TONGUE EVERY 5 MINUTES UP TO 15 MIN FOR CHESTPAIN. IF NO RELIEF CALL 911.  . pantoprazole (PROTONIX) 40 MG tablet Take 1 tablet by mouth daily.   . traMADol (ULTRAM) 50 MG tablet Take 1 tablet (50 mg total) by mouth every 6 (six) hours as needed for moderate pain.  . TRESIBA FLEXTOUCH 100 UNIT/ML SOPN FlexTouch Pen INJECT 50 UNITS SUBCUTANEOUSLY AT BEDTIME.  . [DISCONTINUED] topiramate (TOPAMAX) 25 MG tablet Take 25 mg by mouth daily.    No facility-administered encounter medications on file as of 09/03/2017.    ALLERGIES: No Known Allergies VACCINATION STATUS:  There is no immunization history on file for this patient.  Diabetes  He presents for his follow-up diabetic visit. He has type 2 diabetes mellitus. Onset time: He was diagnosed at approximate age of 5 years. In this particular patient with a history of heavy alcohol use possiblity of pancreatic diabetes is high. His disease course has been improving. Pertinent negatives for hypoglycemia include no confusion, headaches, pallor, seizures or speech difficulty. Pertinent negatives for diabetes include no chest pain, no fatigue, no polydipsia, no polyphagia, no polyuria and no weakness. There are no hypoglycemic complications. Symptoms are improving. Diabetic complications include a CVA  and heart disease. Risk factors for coronary artery disease include diabetes mellitus, dyslipidemia, hypertension, male sex, sedentary lifestyle and tobacco exposure. Current diabetic treatment includes oral agent (dual therapy). He is compliant with treatment some of the time. His weight is stable. He is following a generally unhealthy diet. When asked about meal planning, he reported none. He has not had a previous visit with a dietitian (he did not keep appointment.). He rarely participates in exercise. His home blood glucose trend is decreasing steadily. His breakfast blood glucose range is generally 140-180 mg/dl. His lunch blood glucose range is generally 140-180 mg/dl. His dinner blood glucose  range is generally 140-180 mg/dl. His bedtime blood glucose range is generally 140-180 mg/dl. His overall blood glucose range is 140-180 mg/dl. Eye exam is current.  Hyperlipidemia  This is a chronic problem. The current episode started more than 1 year ago. Exacerbating diseases include diabetes. Pertinent negatives include no chest pain, myalgias or shortness of breath. Current antihyperlipidemic treatment includes statins. Risk factors for coronary artery disease include dyslipidemia, diabetes mellitus, hypertension and a sedentary lifestyle.  Hypertension  This is a chronic problem. The current episode started more than 1 year ago. The problem is controlled. Pertinent negatives include no chest pain, headaches, neck pain, palpitations or shortness of breath. Risk factors for coronary artery disease include diabetes mellitus, dyslipidemia and smoking/tobacco exposure. Past treatments include beta blockers. Hypertensive end-organ damage includes CAD/MI and CVA.    Review of Systems  Constitutional: Negative for fatigue and unexpected weight change.  HENT: Negative for dental problem, mouth sores and trouble swallowing.   Eyes: Negative for visual disturbance.  Respiratory: Negative for cough, choking, chest  tightness, shortness of breath and wheezing.   Cardiovascular: Negative for chest pain, palpitations and leg swelling.  Gastrointestinal: Negative for abdominal distention, abdominal pain, constipation, diarrhea, nausea and vomiting.  Endocrine: Negative for polydipsia, polyphagia and polyuria.  Genitourinary: Negative for dysuria, flank pain, hematuria and urgency.  Musculoskeletal: Negative for back pain, gait problem, myalgias and neck pain.  Skin: Negative for pallor, rash and wound.  Neurological: Negative for seizures, syncope, speech difficulty, weakness, numbness and headaches.  Psychiatric/Behavioral: Negative for confusion and dysphoric mood.    Objective:    BP 115/74   Pulse 90   Ht 5\' 9"  (1.753 m)   BMI 24.01 kg/m   Wt Readings from Last 3 Encounters:  07/08/17 162 lb 9.6 oz (73.8 kg)  05/07/17 163 lb (73.9 kg)  04/21/17 160 lb (72.6 kg)    Physical Exam  Constitutional: He is oriented to person, place, and time. He appears well-developed. He is cooperative. No distress.  HENT:  Head: Normocephalic and atraumatic.  Eyes: EOM are normal.  Neck: Normal range of motion. Neck supple. No tracheal deviation present. No thyromegaly present.  Cardiovascular: Normal rate, S1 normal and S2 normal. Exam reveals no gallop.  No murmur heard. Pulses:      Dorsalis pedis pulses are 1+ on the right side, and 1+ on the left side.       Posterior tibial pulses are 1+ on the right side, and 1+ on the left side.  Pulmonary/Chest: Effort normal. No respiratory distress. He has no wheezes.  Abdominal: He exhibits no distension. There is no tenderness. There is no guarding and no CVA tenderness.  Musculoskeletal: He exhibits no edema.       Right shoulder: He exhibits no swelling and no deformity.  Neurological: He is alert and oriented to person, place, and time. He has normal strength. No cranial nerve deficit or sensory deficit. Gait normal.  Skin: Skin is warm and dry. No rash noted.  No cyanosis. Nails show no clubbing.  Psychiatric: He has a normal mood and affect. His speech is normal. Cognition and memory are normal.   passive and unconcerned affect.    Results for orders placed or performed in visit on 07/20/17  CUP PACEART REMOTE DEVICE CHECK  Result Value Ref Range   Date Time Interrogation Session [id number]    Pulse Generator Manufacturer MERM    Pulse Gen Model [id number] Reveal LINQ    Pulse Gen Serial Number [id number]  Clinic Name [place]    Implantable Pulse Generator Type ICM/ILR    Implantable Pulse Generator Implant Date [id number]    Eval Rhythm SR    Diabetic Labs (most recent): Lab Results  Component Value Date   HGBA1C 7.3 (H) 09/01/2017   HGBA1C 8.9 (H) 04/20/2017   HGBA1C 8.5 (H) 08/25/2016   Lipid Panel     Component Value Date/Time   CHOL 183 09/01/2017 0801   TRIG 266 (H) 09/01/2017 0801   HDL 29 (L) 09/01/2017 0801   CHOLHDL 6.3 (H) 09/01/2017 0801   VLDL 69 (H) 02/10/2016 0554   LDLCALC 116 (H) 09/01/2017 0801      Assessment & Plan:   1. Type 2 diabetes mellitus with vascular disease (HCC) -His diabetes is  complicated by coronary artery disease and patient remains at a high risk for more acute and chronic complications of diabetes which include CAD, CVA, CKD, retinopathy, and neuropathy. These are all discussed in detail with the patient.  -- He returns with better engagement and improving glycemic profile   -His A1c has improved to 7.3% from 8.9%.   He is benefiting from the [facility] device allowing him to engage better.  He has significant medical history of noncompliance/nonadherence.     - I have re-counseled the patient on diet management  by adopting a carbohydrate restricted / protein rich  Diet.  -  Suggestion is made for him to avoid simple carbohydrates  from his diet including Cakes, Sweet Desserts / Pastries, Ice Cream, Soda (diet and regular), Sweet Tea, Candies, Chips, Cookies, Store  Bought Juices, Alcohol in Excess of  1-2 drinks a day, Artificial Sweeteners, and "Sugar-free" Products. This will help patient to have stable blood glucose profile and potentially avoid unintended weight gain.  - Patient is advised to stick to a routine mealtimes to eat 3 meals  a day and avoid unnecessary snacks ( to snack only to correct hypoglycemia).  - I have approached patient with the following individualized plan to manage diabetes and patient agrees.  - Proper use of insulin is the  exclusive choice he has to treat his diabetes. - He wears the continuous glucose monitoring device Libre which helped significantly. --He does do his insulin properly this time.   -I advised him to continue Levemir 50 units nightly, NovoLog  12 units 3 times daily before meals for pre-meal blood glucose above 90 mg/dL. - He will use an additional correction dose of NovoLog for blood glucose readings above 150 mg/dL. -He is encouraged to call clinic for blood glucose levels less than 70 or above 200 mg /dl. - He is not the right candidate for Invokana nor incretintherapy.  -Target numbers for A1c, LDL, HDL, Triglycerides, Waist Circumference were discussed in detail.   2) BP/HTN: His blood pressure is controlled to target.  He is advised to continue his current medications including beta blockers.  He has marginal blood pressure, will not tolerate additional ACE inhibitors.   3) Lipids/HPL: His lipid panel shows still significantly above target LDL at 116.  -He is advised to be consistent in his atorvastatin 40 mg p.o. nightly.     4)  Weight/Diet:   He is  following with  CDE consult, exercise, and carbohydrates information provided.  5) Chronic Care/Health Maintenance:  -Patient is  Statin medications and encouraged to continue to follow up with Ophthalmology, Podiatrist at least yearly or according to recommendations, and advised to  quit Smoking (tragically he resumed smoking after  he quit briefly  during his diagnosis of CVA ). I have recommended yearly flu vaccine and pneumonia vaccination at least every 5 years; moderate intensity exercise for up to 150 minutes weekly; and  sleep for at least 7 hours a day.  - He has a chronic heavy smoker , cannot exercise optimally.  I advised patient to maintain close follow up with his PCP for primary care needs.  - Time spent with the patient: 25 min, of which >50% was spent in reviewing his blood glucose logs , discussing his hypo- and hyper-glycemic episodes, reviewing his current and  previous labs and insulin doses and developing a plan to avoid hypo- and hyper-glycemia. Please refer to Patient Instructions for Blood Glucose Monitoring and Insulin/Medications Dosing Guide"  in media tab for additional information. [name] participated in the discussions, expressed understanding, and voiced agreement with the above plans.  All questions were answered to his satisfaction. he is encouraged to contact clinic should he have any questions or concerns prior to his return visit.  Follow up plan: No follow-ups on file.  Glade Lloyd, MD Phone: [phone]  Fax: [phone]  This note was partially dictated with voice recognition software. Similar sounding words can be transcribed inadequately or may not  be corrected upon review.  09/03/2017, 8:38 AM

## 2017-09-03 NOTE — Patient Instructions (Signed)

## 2017-09-17 ENCOUNTER — Other Ambulatory Visit: Payer: Self-pay | Admitting: Cardiology

## 2017-09-22 ENCOUNTER — Ambulatory Visit (INDEPENDENT_AMBULATORY_CARE_PROVIDER_SITE_OTHER): Payer: 59 | Admitting: *Deleted

## 2017-09-22 DIAGNOSIS — I639 Cerebral infarction, unspecified: Secondary | ICD-10-CM

## 2017-09-23 NOTE — Progress Notes (Signed)
[facility]link Summary Report / Loop Recorder 

## 2017-10-05 LAB — CUP PACEART REMOTE DEVICE CHECK
Date Time Interrogation Session: 20190725213546
MDC IDC PG IMPLANT DT: 20180116

## 2017-10-20 LAB — CUP PACEART REMOTE DEVICE CHECK
Date Time Interrogation Session: 20190827221033
MDC IDC PG IMPLANT DT: 20180116

## 2017-10-26 ENCOUNTER — Ambulatory Visit (INDEPENDENT_AMBULATORY_CARE_PROVIDER_SITE_OTHER): Payer: 59 | Admitting: *Deleted

## 2017-10-26 DIAGNOSIS — I639 Cerebral infarction, unspecified: Secondary | ICD-10-CM

## 2017-10-26 LAB — CUP PACEART REMOTE DEVICE CHECK
Date Time Interrogation Session: 20190929223852
MDC IDC PG IMPLANT DT: 20180116

## 2017-10-26 NOTE — Progress Notes (Signed)
[facility]link Summary Report / Loop Recorder 

## 2017-11-14 ENCOUNTER — Other Ambulatory Visit: Payer: Self-pay | Admitting: Cardiology

## 2017-11-27 ENCOUNTER — Ambulatory Visit (INDEPENDENT_AMBULATORY_CARE_PROVIDER_SITE_OTHER): Payer: 59 | Admitting: *Deleted

## 2017-11-27 DIAGNOSIS — I639 Cerebral infarction, unspecified: Secondary | ICD-10-CM | POA: Diagnosis not present

## 2017-11-28 NOTE — Progress Notes (Signed)
[facility]link Summary Report / Loop Recorder 

## 2017-12-18 LAB — CUP PACEART REMOTE DEVICE CHECK
MDC IDC PG IMPLANT DT: 20180116
MDC IDC SESS DTM: 20191101233829

## 2017-12-30 ENCOUNTER — Ambulatory Visit (INDEPENDENT_AMBULATORY_CARE_PROVIDER_SITE_OTHER): Payer: 59

## 2017-12-30 DIAGNOSIS — I639 Cerebral infarction, unspecified: Secondary | ICD-10-CM

## 2017-12-31 NOTE — Progress Notes (Signed)
[facility]link Summary Report / Loop Recorder 

## 2018-01-04 ENCOUNTER — Ambulatory Visit: Payer: 59 | Admitting: "Endocrinology

## 2018-01-31 LAB — CUP PACEART REMOTE DEVICE CHECK
Implantable Pulse Generator Implant Date: 20180116
MDC IDC SESS DTM: 20191205003916

## 2018-02-01 ENCOUNTER — Ambulatory Visit (INDEPENDENT_AMBULATORY_CARE_PROVIDER_SITE_OTHER): Payer: BLUE CROSS/BLUE SHIELD

## 2018-02-01 DIAGNOSIS — I639 Cerebral infarction, unspecified: Secondary | ICD-10-CM | POA: Diagnosis not present

## 2018-02-02 LAB — CUP PACEART REMOTE DEVICE CHECK
Implantable Pulse Generator Implant Date: 20180116
MDC IDC SESS DTM: 20200107011130

## 2018-02-02 NOTE — Progress Notes (Signed)
[facility]link Summary Report / Loop Recorder 

## 2018-02-26 ENCOUNTER — Other Ambulatory Visit: Payer: Self-pay

## 2018-03-03 ENCOUNTER — Emergency Department (HOSPITAL_COMMUNITY): Payer: BLUE CROSS/BLUE SHIELD

## 2018-03-03 ENCOUNTER — Inpatient Hospital Stay (HOSPITAL_COMMUNITY)
Admission: EM | Admit: 2018-03-03 | Discharge: 2018-03-04 | DRG: 065 | Disposition: A | Discharge status: Home / Self Care | Payer: BLUE CROSS/BLUE SHIELD | Attending: Internal Medicine | Admitting: Internal Medicine

## 2018-03-03 ENCOUNTER — Encounter (HOSPITAL_COMMUNITY): Payer: Self-pay | Admitting: *Deleted

## 2018-03-03 DIAGNOSIS — Z9049 Acquired absence of other specified parts of digestive tract: Secondary | ICD-10-CM

## 2018-03-03 DIAGNOSIS — E1165 Type 2 diabetes mellitus with hyperglycemia: Secondary | ICD-10-CM | POA: Diagnosis present

## 2018-03-03 DIAGNOSIS — Z8249 Family history of ischemic heart disease and other diseases of the circulatory system: Secondary | ICD-10-CM

## 2018-03-03 DIAGNOSIS — Z809 Family history of malignant neoplasm, unspecified: Secondary | ICD-10-CM

## 2018-03-03 DIAGNOSIS — Z8673 Personal history of transient ischemic attack (TIA), and cerebral infarction without residual deficits: Secondary | ICD-10-CM

## 2018-03-03 DIAGNOSIS — K449 Diaphragmatic hernia without obstruction or gangrene: Secondary | ICD-10-CM | POA: Diagnosis present

## 2018-03-03 DIAGNOSIS — H209 Unspecified iridocyclitis: Secondary | ICD-10-CM | POA: Diagnosis present

## 2018-03-03 DIAGNOSIS — I10 Essential (primary) hypertension: Secondary | ICD-10-CM | POA: Diagnosis present

## 2018-03-03 DIAGNOSIS — I6529 Occlusion and stenosis of unspecified carotid artery: Secondary | ICD-10-CM | POA: Diagnosis present

## 2018-03-03 DIAGNOSIS — Z7902 Long term (current) use of antithrombotics/antiplatelets: Secondary | ICD-10-CM

## 2018-03-03 DIAGNOSIS — Z833 Family history of diabetes mellitus: Secondary | ICD-10-CM

## 2018-03-03 DIAGNOSIS — R519 Headache, unspecified: Secondary | ICD-10-CM

## 2018-03-03 DIAGNOSIS — Z79891 Long term (current) use of opiate analgesic: Secondary | ICD-10-CM

## 2018-03-03 DIAGNOSIS — E782 Mixed hyperlipidemia: Secondary | ICD-10-CM | POA: Diagnosis present

## 2018-03-03 DIAGNOSIS — Z955 Presence of coronary angioplasty implant and graft: Secondary | ICD-10-CM

## 2018-03-03 DIAGNOSIS — Z7982 Long term (current) use of aspirin: Secondary | ICD-10-CM

## 2018-03-03 DIAGNOSIS — F17210 Nicotine dependence, cigarettes, uncomplicated: Secondary | ICD-10-CM | POA: Diagnosis present

## 2018-03-03 DIAGNOSIS — I252 Old myocardial infarction: Secondary | ICD-10-CM

## 2018-03-03 DIAGNOSIS — I639 Cerebral infarction, unspecified: Secondary | ICD-10-CM | POA: Diagnosis not present

## 2018-03-03 DIAGNOSIS — H547 Unspecified visual loss: Secondary | ICD-10-CM | POA: Diagnosis present

## 2018-03-03 DIAGNOSIS — Z8719 Personal history of other diseases of the digestive system: Secondary | ICD-10-CM

## 2018-03-03 DIAGNOSIS — R51 Headache: Secondary | ICD-10-CM

## 2018-03-03 DIAGNOSIS — H5340 Unspecified visual field defects: Secondary | ICD-10-CM | POA: Diagnosis present

## 2018-03-03 DIAGNOSIS — I6629 Occlusion and stenosis of unspecified posterior cerebral artery: Secondary | ICD-10-CM | POA: Diagnosis present

## 2018-03-03 DIAGNOSIS — K219 Gastro-esophageal reflux disease without esophagitis: Secondary | ICD-10-CM | POA: Diagnosis present

## 2018-03-03 DIAGNOSIS — E1159 Type 2 diabetes mellitus with other circulatory complications: Secondary | ICD-10-CM | POA: Diagnosis present

## 2018-03-03 DIAGNOSIS — I2510 Atherosclerotic heart disease of native coronary artery without angina pectoris: Secondary | ICD-10-CM | POA: Diagnosis present

## 2018-03-03 DIAGNOSIS — Z794 Long term (current) use of insulin: Secondary | ICD-10-CM

## 2018-03-03 DIAGNOSIS — E1151 Type 2 diabetes mellitus with diabetic peripheral angiopathy without gangrene: Secondary | ICD-10-CM | POA: Diagnosis present

## 2018-03-03 DIAGNOSIS — H5347 Heteronymous bilateral field defects: Secondary | ICD-10-CM

## 2018-03-03 LAB — CBC
HCT: 46.8 % (ref 39.0–52.0)
Hemoglobin: 14.7 g/dL (ref 13.0–17.0)
MCH: 25.9 pg — ABNORMAL LOW (ref 26.0–34.0)
MCHC: 31.4 g/dL (ref 30.0–36.0)
MCV: 82.5 fL (ref 80.0–100.0)
Platelets: 167 10*3/uL (ref 150–400)
RBC: 5.67 MIL/uL (ref 4.22–5.81)
RDW: 13.5 % (ref 11.5–15.5)
WBC: 12.3 10*3/uL — ABNORMAL HIGH (ref 4.0–10.5)
nRBC: 0 % (ref 0.0–0.2)

## 2018-03-03 LAB — URINALYSIS, ROUTINE W REFLEX MICROSCOPIC
Bilirubin Urine: NEGATIVE
Glucose, UA: 50 mg/dL — AB
Hgb urine dipstick: NEGATIVE
Ketones, ur: 20 mg/dL — AB
Leukocytes, UA: NEGATIVE
NITRITE: NEGATIVE
PROTEIN: NEGATIVE mg/dL
Specific Gravity, Urine: 1.021 (ref 1.005–1.030)
pH: 6 (ref 5.0–8.0)

## 2018-03-03 LAB — CBG MONITORING, ED
Glucose-Capillary: 193 mg/dL — ABNORMAL HIGH (ref 70–99)
Glucose-Capillary: 151 mg/dL — ABNORMAL HIGH (ref 70–99)
Glucose-Capillary: 98 mg/dL (ref 70–99)

## 2018-03-03 LAB — PROTIME-INR
INR: 0.98
Prothrombin Time: 12.9 seconds (ref 11.4–15.2)

## 2018-03-03 LAB — COMPREHENSIVE METABOLIC PANEL
ALK PHOS: 63 U/L (ref 38–126)
ALT: 23 U/L (ref 0–44)
AST: 21 U/L (ref 15–41)
Albumin: 4.2 g/dL (ref 3.5–5.0)
Anion gap: 7 (ref 5–15)
BUN: 13 mg/dL (ref 6–20)
CALCIUM: 8.9 mg/dL (ref 8.9–10.3)
CO2: 24 mmol/L (ref 22–32)
CREATININE: 0.62 mg/dL (ref 0.61–1.24)
Chloride: 105 mmol/L (ref 98–111)
GFR calc Af Amer: >60 mL/min (ref >60)
GFR calc non Af Amer: >60 mL/min (ref >60)
Glucose, Bld: 200 mg/dL — ABNORMAL HIGH (ref 70–99)
Potassium: 4.4 mmol/L (ref 3.5–5.1)
Sodium: 136 mmol/L (ref 135–145)
Total Bilirubin: 0.5 mg/dL (ref 0.3–1.2)
Total Protein: 7.1 g/dL (ref 6.5–8.1)

## 2018-03-03 LAB — DIFFERENTIAL
Abs Immature Granulocytes: 0.05 10*3/uL (ref 0.00–0.07)
Basophils Absolute: 0.1 10*3/uL (ref 0.0–0.1)
Basophils Relative: 0 %
Eosinophils Absolute: 0 10*3/uL (ref 0.0–0.5)
Eosinophils Relative: 0 %
Immature Granulocytes: 0 %
LYMPHS PCT: 13 %
Lymphs Abs: 1.6 10*3/uL (ref 0.7–4.0)
Monocytes Absolute: 0.7 10*3/uL (ref 0.1–1.0)
Monocytes Relative: 6 %
Neutro Abs: 9.9 10*3/uL — ABNORMAL HIGH (ref 1.7–7.7)
Neutrophils Relative %: 81 %

## 2018-03-03 LAB — ETHANOL

## 2018-03-03 LAB — RAPID URINE DRUG SCREEN, HOSP PERFORMED
Amphetamines: NOT DETECTED
Barbiturates: NOT DETECTED
Benzodiazepines: NOT DETECTED
Cocaine: NOT DETECTED
Opiates: NOT DETECTED
Tetrahydrocannabinol: NOT DETECTED

## 2018-03-03 LAB — I-STAT TROPONIN, ED: Troponin i, poc: 0 ng/mL (ref 0.00–0.08)

## 2018-03-03 LAB — APTT: aPTT: 29 seconds (ref 24–36)

## 2018-03-03 MED ORDER — ACETAMINOPHEN 160 MG/5ML PO SOLN: 650.0000 mg | ORAL | Status: DC | PRN

## 2018-03-03 MED ORDER — ACETAMINOPHEN 650 MG RE SUPP: 650.0000 mg | RECTAL | Status: DC | PRN

## 2018-03-03 MED ORDER — DIPHENHYDRAMINE HCL 50 MG/ML IJ SOLN
25.0000 mg | Freq: Once | INTRAMUSCULAR | Status: AC
  Administered 2018-03-03: 25 mg via INTRAVENOUS
  Filled 2018-03-03: qty 1

## 2018-03-03 MED ORDER — SODIUM CHLORIDE 0.9 % IV BOLUS
1000.0000 mL | Freq: Once | INTRAVENOUS | Status: AC
  Administered 2018-03-03: 1000 mL via INTRAVENOUS

## 2018-03-03 MED ORDER — INSULIN ASPART 100 UNIT/ML ~~LOC~~ SOLN
0.0000 [IU] | Freq: Three times a day (TID) | SUBCUTANEOUS | Status: DC
  Administered 2018-03-04: 1 [IU] via SUBCUTANEOUS

## 2018-03-03 MED ORDER — PROCHLORPERAZINE EDISYLATE 10 MG/2ML IJ SOLN
10.0000 mg | Freq: Once | INTRAMUSCULAR | Status: AC
  Administered 2018-03-03: 10 mg via INTRAVENOUS
  Filled 2018-03-03: qty 2

## 2018-03-03 MED ORDER — ACETAMINOPHEN 325 MG PO TABS: 650.0000 mg | ORAL_TABLET | ORAL | Status: DC | PRN

## 2018-03-03 MED ORDER — SENNOSIDES-DOCUSATE SODIUM 8.6-50 MG PO TABS
1.0000 | ORAL_TABLET | Freq: Every evening | ORAL | Status: DC | PRN
  Filled 2018-03-03: qty 1

## 2018-03-03 MED ORDER — STROKE: EARLY STAGES OF RECOVERY BOOK
Freq: Once | Status: DC
  Filled 2018-03-03: qty 1

## 2018-03-03 MED ORDER — ACETAMINOPHEN 500 MG PO TABS
1000.0000 mg | ORAL_TABLET | Freq: Three times a day (TID) | ORAL | Status: DC | PRN
  Administered 2018-03-03: 1000 mg via ORAL
  Filled 2018-03-03: qty 2

## 2018-03-03 MED ORDER — TRAMADOL HCL 50 MG PO TABS
50.0000 mg | ORAL_TABLET | Freq: Four times a day (QID) | ORAL | Status: DC | PRN
  Administered 2018-03-03 – 2018-03-04 (×3): 50 mg via ORAL
  Filled 2018-03-03 (×3): qty 1

## 2018-03-03 MED ORDER — KETOROLAC TROMETHAMINE 30 MG/ML IJ SOLN
30.0000 mg | Freq: Once | INTRAMUSCULAR | Status: AC
  Administered 2018-03-03: 30 mg via INTRAVENOUS
  Filled 2018-03-03: qty 1

## 2018-03-03 MED ORDER — PANTOPRAZOLE SODIUM 40 MG PO TBEC
40.0000 mg | DELAYED_RELEASE_TABLET | Freq: Every day | ORAL | Status: DC
  Administered 2018-03-04: 40 mg via ORAL
  Filled 2018-03-03: qty 1

## 2018-03-03 MED ORDER — ATORVASTATIN CALCIUM 40 MG PO TABS
40.0000 mg | ORAL_TABLET | Freq: Every day | ORAL | Status: DC
  Administered 2018-03-03: 40 mg via ORAL
  Filled 2018-03-03: qty 1

## 2018-03-03 MED ORDER — INSULIN GLARGINE 100 UNIT/ML ~~LOC~~ SOLN
30.0000 [IU] | Freq: Every day | SUBCUTANEOUS | Status: DC
  Filled 2018-03-03 (×2): qty 0.3

## 2018-03-03 MED ORDER — CLOPIDOGREL BISULFATE 75 MG PO TABS
75.0000 mg | ORAL_TABLET | Freq: Every day | ORAL | Status: DC
  Administered 2018-03-04: 75 mg via ORAL
  Filled 2018-03-03: qty 1

## 2018-03-03 MED ORDER — FENTANYL CITRATE (PF) 100 MCG/2ML IJ SOLN
50.0000 ug | Freq: Once | INTRAMUSCULAR | Status: AC
  Administered 2018-03-03: 50 ug via INTRAVENOUS
  Filled 2018-03-03: qty 2

## 2018-03-03 MED ORDER — ASPIRIN EC 81 MG PO TBEC
81.0000 mg | DELAYED_RELEASE_TABLET | Freq: Every day | ORAL | Status: DC
  Administered 2018-03-04: 81 mg via ORAL
  Filled 2018-03-03: qty 1

## 2018-03-03 NOTE — ED Provider Notes (Signed)
[facility] EMERGENCY DEPARTMENT Provider Note   CSN: [id number] Arrival date & time: 03/03/18  1220     History   Chief Complaint Chief Complaint  Patient presents with  . Headache    HPI Nicholas [name] is a 58 y.o. male.  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  The patient and his wife are the primary historians.  Reportedly the patient has had a headache which is frontal and bitemporal and has been present for the last several days, gradually worsening and became severe today associated with vomiting prior to arrival.  He does take daily Plavix and a baby aspirin as well as tramadol 50 mg, Protonix 40 mg and insulin long-acting 50 units at night.  He reports that this headache is persistent, nothing seems to make it better or worse, he works as a [employer] transport manager and has continued to work the last 2 days despite the headache.  He denies focal numbness weakness and no changes in vision.  Denies any difficulty with coordination, he was able to get himself dressed, he has been able to speak without any slurred speech, his wife corroborates these findings as well.  He denies fevers, neck pain and has not had any diplopia.  There is no rash to the skin.  He has only been taking his home medications including tramadol for this pain, he does not take ibuprofen or Tylenol.  Past Medical History:  Diagnosis Date  . Anal lesion    Anal papilla/tags - external lesion  . Colon polyp    Hyperplastic rectosigmoid polyp 9/08  . Coronary atherosclerosis of native coronary artery    DES to PLB, BMS to mid and distal RCA, DES to circumflex and distal LAD, 6/10 (5 stents)  . GERD (gastroesophageal reflux disease)   .  Helicobacter pylori (H. pylori) 09/2009   Treated with helidac  . Hiatal hernia   . Hyperplastic colon polyp 2008  . Mixed hyperlipidemia   . Myocardial infarction ([place]) 06/2008  . Stroke [facility])    January 2018  . Type 2 diabetes mellitus [facility])     Patient Active Problem List   Diagnosis Date Noted  . Uncontrolled type 2 diabetes mellitus with hyperglycemia ([place]) 04/21/2017  . Hyperlipemia 04/21/2016  . Malnutrition of moderate degree 02/11/2016  . Acute CVA (cerebrovascular accident) ([place]) 02/11/2016  . TIA (transient ischemic attack) 02/09/2016  . Right carotid bruit 02/09/2016  . Non compliance with medical treatment 02/09/2016  . Type 2 diabetes mellitus with vascular disease ([place]) 12/12/2014  . Essential hypertension, benign 12/12/2014  . Hyperlipidemia 12/12/2014  . 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 TOBACCO ABUSE 07/26/2008  . CORONARY ATHEROSCLEROSIS NATIVE CORONARY ARTERY 07/26/2008  . GERD 07/25/2008  . BACK PAIN, CHRONIC 07/25/2008    Past Surgical History:  Procedure Laterality Date  . CHOLECYSTECTOMY    . COLLAPSED LUNG     15 years ago  . COLONOSCOPY  10/12/2006   Dr. Gala Romney- rectosigmoid polyp s/phot snare polypectomy o/w normal rectum and terminal ileum. hyperplastic polyp on bx  . EP IMPLANTABLE DEVICE N/A 02/12/2016   Procedure: Loop Recorder Insertion;  Surgeon: Thompson Grayer, MD;  Location: [facility];  Service: Cardiovascular;  Laterality: N/A;  .  ESOPHAGOGASTRODUODENOSCOPY  10/12/2006   Dr. Gala Romney- examination of the tubular esophagus revealed no mucosal abnormalities. the EG junction was easily traversed. small hiatal hernia, the gastric mucosa o/w appeared normal. there was no infiltrating process or frank ulcer seen.  . TEE WITHOUT CARDIOVERSION N/A 02/12/2016   Procedure: TRANSESOPHAGEAL ECHOCARDIOGRAM (TEE);  Surgeon: Jerline Pain, MD;  Location: [facility] ENDOSCOPY;  Service: Cardiovascular;  Laterality: N/A;  . WIRE IN APEX OF RIGHT LUNG     Since  childhood        Home Medications    Prior to Admission medications   Medication Sig Start Date End Date Taking? Authorizing Provider  acetaminophen (TYLENOL) 500 MG tablet Take 1,000 mg by mouth every 6 (six) hours as needed.    [provider]  aspirin EC 81 MG tablet Take 1 tablet (81 mg total) by mouth daily. 07/08/17   Satira Sark, MD  atorvastatin (LIPITOR) 40 MG tablet Take 1 tablet (40 mg total) by mouth daily. 09/02/17 12/01/17  Strader, Fransisco Hertz, PA-C  BD PEN NEEDLE NANO U/F 32G X 4 MM MISC USE AS DIRECTED FOUR TIMES DAILY. 08/31/17   Cassandria Anger, MD  clopidogrel (PLAVIX) 75 MG tablet TAKE (1) TABLET BY MOUTH ONCE DAILY. 11/16/17   Satira Sark, MD  Continuous Blood Gluc Receiver (FREESTYLE LIBRE 14 DAY READER) [place] 1 each by Does not apply route every 14 (fourteen) days. 04/21/17   Cassandria Anger, MD  Continuous Blood Gluc Sensor (FREESTYLE LIBRE 14 DAY SENSOR) MISC 1 each by Does not apply route every 14 (fourteen) days. 05/07/17   Cassandria Anger, MD  Continuous Blood Gluc Sensor (FREESTYLE LIBRE SENSOR SYSTEM) MISC USE 1 SENSOR TO CHECK GLUCOSE -- CHANGE EVERY 10 DAYS 04/20/17   Cassandria Anger, MD  gemfibrozil (LOPID) 600 MG tablet Take 1 tablet (600 mg total) by mouth 2 (two) times daily before a meal. 02/21/16   Nida, Marella Chimes, MD  glucose blood (BAYER CONTOUR TEST) test strip Use as instructed 4 x daily E11.65. Contour plus test strips 07/23/15   Nida, Marella Chimes, MD  insulin aspart (NOVOLOG FLEXPEN) 100 UNIT/ML FlexPen Inject 12-18 Units into the skin 3 (three) times daily with meals. 09/30/16   Cassandria Anger, MD  nicotine (NICODERM CQ - DOSED IN MG/24 HOURS) 21 mg/24hr patch Place 1 patch (21 mg total) onto the skin daily. 02/12/16   Geradine Girt, DO  nitroGLYCERIN (NITROSTAT) 0.4 MG SL tablet DISSOLVE 1 TABLET UNDER TONGUE EVERY 5 MINUTES UP TO 15 MIN FOR CHESTPAIN. IF NO RELIEF CALL 911. 08/28/17   Satira Sark, MD  pantoprazole (PROTONIX) 40 MG tablet Take 1 tablet by mouth daily.  04/11/10   [provider]  traMADol (ULTRAM) 50 MG tablet Take 1 tablet (50 mg total) by mouth every 6 (six) hours as needed for moderate pain. 02/12/16   Geradine Girt, DO  TRESIBA FLEXTOUCH 100 UNIT/ML SOPN FlexTouch Pen INJECT 50 UNITS SUBCUTANEOUSLY AT BEDTIME. 08/31/17   Nida, Marella Chimes, MD  topiramate (TOPAMAX) 25 MG tablet Take 25 mg by mouth daily.  06/20/10 04/21/11  [provider]    Family History Family History  Problem Relation Age of Onset  . Diabetes Mother   . Heart disease Father   . Cancer Brother   . CVA Neg Hx     Social History Social History   Tobacco Use  . Smoking status: Current Every Day Smoker    Packs/day: 0.50  Years: 34.00    Pack years: 17.00    Types: Cigarettes    Start date: 04/08/1973  . Smokeless tobacco: Never Used  Substance Use Topics  . Alcohol use: No    Alcohol/week: 0.0 standard drinks  . Drug use: No     Allergies   Patient has no known allergies.   Review of Systems Review of Systems  All other systems reviewed and are negative.    Physical Exam Updated Vital Signs Ht 1.753 m (5\' 9" )   Wt 77.1 kg   BMI 25.10 kg/m   Physical Exam Vitals signs and nursing note reviewed.  Constitutional:      General: He is not in acute distress.    Appearance: He is well-developed.  HENT:     Head: Normocephalic and atraumatic.     Comments: There is no tenderness over the bitemporal areas or the frontal scalp    Mouth/Throat:     Pharynx: No oropharyngeal exudate.  Eyes:     General: No scleral icterus.       Right eye: No discharge.        Left eye: No discharge.     Conjunctiva/sclera: Conjunctivae normal.     Pupils: Pupils are equal, round, and reactive to light.  Neck:     Musculoskeletal: Normal range of motion and neck supple.     Thyroid: No thyromegaly.     Vascular: No JVD.  Cardiovascular:     Rate and  Rhythm: Normal rate and regular rhythm.     Heart sounds: Normal heart sounds. No murmur. No friction rub. No gallop.   Pulmonary:     Effort: Pulmonary effort is normal. No respiratory distress.     Breath sounds: Normal breath sounds. No wheezing or rales.  Abdominal:     General: Bowel sounds are normal. There is no distension.     Palpations: Abdomen is soft. There is no mass.     Tenderness: There is no abdominal tenderness.  Musculoskeletal: Normal range of motion.        General: No tenderness.  Lymphadenopathy:     Cervical: No cervical adenopathy.  Skin:    General: Skin is warm and dry.     Findings: No erythema or rash.  Neurological:     Mental Status: He is alert.     Coordination: Coordination normal.     Comments: Speech is clear, coordination is normal by finger-nose-finger, he has no pronator drift with equal strength bilaterally in the arms and the legs with ability to grip, straight leg raise, his face is symmetrical with normal tongue deviation, normal rise of the palate, normal extraocular movements, normal sensation of the face.  Cranial nerves III through XII are intact.  He has a visual field cut to the right visual field of both eyes.  He was not aware of a prior hemianopsia  Psychiatric:        Behavior: Behavior normal.      ED Treatments / Results  Labs (all labs ordered are listed, but only abnormal results are displayed) Labs Reviewed  CBG MONITORING, ED - Abnormal; Notable for the following components:      Result Value   Glucose-Capillary 193 (*)    All other components within normal limits    EKG None  Radiology No results found.  Procedures .Critical Care Performed by: Noemi Chapel, MD Authorized by: Noemi Chapel, MD   Critical care provider statement:    Critical care time (minutes):  35   Critical care time was exclusive of:  Separately billable procedures and treating other patients and teaching time   Critical care was  necessary to treat or prevent imminent or life-threatening deterioration of the following conditions:  CNS failure or compromise   Critical care was time spent personally by me on the following activities:  Blood draw for specimens, development of treatment plan with patient or surrogate, discussions with consultants, evaluation of patient's response to treatment, examination of patient, obtaining history from patient or surrogate, ordering and performing treatments and interventions, ordering and review of laboratory studies, ordering and review of radiographic studies, pulse oximetry, re-evaluation of patient's condition and review of old charts   (including critical care time)  Medications Ordered in ED Medications  fentaNYL (SUBLIMAZE) injection 50 mcg (has no administration in time range)     Initial Impression / Assessment and Plan / ED Course  I have reviewed the triage vital signs and the nursing notes.  Pertinent labs & imaging results that were available during my care of the patient were reviewed by me and considered in my medical decision making (see chart for details).     The patient's blood sugar is 193, his neurologic exam is abnormal however the findings correlate with his prior stroke and he was not aware of any visual field changes.  We will proceed with CT scan and discuss with neurology, fentanyl given for headache, will proceed with stronger medications if CT negative after discussion with neurology.  The patient is concerned about having another stroke since his prior stroke presented with headache and vomiting.  Discussed with tele-neurology who recommended doing an acute MRI secondary to the visual field cut despite having the prior stroke involving occipital lobes as his prior neurology notes do not record any prior visual field deficit.  The MRI shows that he is having an acute ischemic stroke with a small amount of associated hemorrhage.  He still does not have the  feeling that he has any visual changes but on repeat testing he still has the visual field cut.  I have paged the neuro hospitalist for further recommendations  I have consulted with Dr. Posey Pronto of the hospitalist service who will coordinate admission to the hospital.  The patient has been given multiple medications for his headache including Compazine, Toradol, Benadryl and a liter of fluid as well as 50 mcg of fentanyl and states that he has mildly improved.  Final Clinical Impressions(s) / ED Diagnoses   Final diagnoses:  Acute ischemic stroke [facility])  Hemianopsia  Bad headache      Noemi Chapel, MD 03/03/18 2152

## 2018-03-03 NOTE — ED Triage Notes (Signed)
Pt with HA since yesterday, worse since waking up today.  Pt with emesis in triage.  Nausea PTA.  Pt states it's hard for him to think clearly for about an hour.  Pt not able to states last well known.

## 2018-03-03 NOTE — H&P (Signed)
History and Physical    Nicholas [name] VOJ:[id number] DOB: [birth year] DOA: 03/03/2018  PCP: Sharilyn Sites, MD  Patient coming from: Home  I have personally briefly reviewed patient's old medical records in [place]  Chief Complaint: Headache  HPI: Nicholas [name] is a 58 y.o. male with medical history significant for insulin-dependent type 2 diabetes, CAD s/p DES, hyperlipidemia, hx of left occipital CVA who presents to the ED with complaint of headache.  Patient states he was in his usual state of health until about 3 days ago when he developed new onset frontal headache which has been consistent.  He reports one episode of nausea with emesis which has resolved.  He denies any associated changes in vision, chest pain, dyspnea, numbness/tingling, or focal weakness.  He has been on DAPT with aspirin and Plavix which he reports taking.  He states that he is not currently on statin therapy due to adherence issues.  ED Course:  Initial vitals showed BP 141/87, pulse 92, RR 18, SPO2 96% on room air. Labs notable for WBC 12.3, otherwise unremarkable.  Patient was noted to have a right lateral visual field deficit in the ED. CT head without contrast was obtained which showed several prior small infarcts without acute infarct evident.  The EDP discussed the case with telemetry neurology who recommended acute MRI for further evaluation.  MRI brain, MRA head/neck without contrast were obtained which showed a small acute infarct in the left medial occipital lobe with slight hemorrhage.  No other acute infarct noted.  MRA head demonstrates possible occlusion distal left PCA corresponding to the left occipital infarct.  The hospitalist service was consulted to admit for further evaluation management of acute CVA.   Review of Systems: As per HPI otherwise 10 point review of systems negative.    Past Medical History:  Diagnosis Date  . Anal lesion    Anal papilla/tags - external lesion  .  Colon polyp    Hyperplastic rectosigmoid polyp 9/08  . Coronary atherosclerosis of native coronary artery    DES to PLB, BMS to mid and distal RCA, DES to circumflex and distal LAD, 6/10 (5 stents)  . GERD (gastroesophageal reflux disease)   . Helicobacter pylori (H. pylori) 09/2009   Treated with helidac  . Hiatal hernia   . Hyperplastic colon polyp 2008  . Mixed hyperlipidemia   . Myocardial infarction ([place]) 06/2008  . Stroke [facility])    January 2018  . Type 2 diabetes mellitus ([place])     Past Surgical History:  Procedure Laterality Date  . CHOLECYSTECTOMY    . COLLAPSED LUNG     15 years ago  . COLONOSCOPY  10/12/2006   Dr. Gala Romney- rectosigmoid polyp s/phot snare polypectomy o/w normal rectum and terminal ileum. hyperplastic polyp on bx  . EP IMPLANTABLE DEVICE N/A 02/12/2016   Procedure: Loop Recorder Insertion;  Surgeon: Thompson Grayer, MD;  Location: [facility];  Service: Cardiovascular;  Laterality: N/A;  . ESOPHAGOGASTRODUODENOSCOPY  10/12/2006   Dr. Gala Romney- examination of the tubular esophagus revealed no mucosal abnormalities. the EG junction was easily traversed. small hiatal hernia, the gastric mucosa o/w appeared normal. there was no infiltrating process or frank ulcer seen.  . TEE WITHOUT CARDIOVERSION N/A 02/12/2016   Procedure: TRANSESOPHAGEAL ECHOCARDIOGRAM (TEE);  Surgeon: Jerline Pain, MD;  Location: [facility] ENDOSCOPY;  Service: Cardiovascular;  Laterality: N/A;  . WIRE IN APEX OF RIGHT LUNG     Since childhood     reports that  he has been smoking cigarettes. He started smoking about 44 years ago. He has a 17.00 pack-year smoking history. He has never used smokeless tobacco. He reports that he does not drink alcohol or use drugs.  No Known Allergies  Family History  Problem Relation Age of Onset  . Diabetes Mother   . Heart disease Father   . Cancer Brother   . CVA Neg Hx      Prior to Admission medications   Medication Sig Start Date End Date Taking?  Authorizing Provider  aspirin EC 81 MG tablet Take 1 tablet (81 mg total) by mouth daily. 07/08/17  Yes Satira Sark, MD  BD PEN NEEDLE NANO U/F 32G X 4 MM MISC USE AS DIRECTED FOUR TIMES DAILY. 08/31/17  Yes Nida, Marella Chimes, MD  clopidogrel (PLAVIX) 75 MG tablet TAKE (1) TABLET BY MOUTH ONCE DAILY. 11/16/17  Yes Satira Sark, MD  Continuous Blood Gluc Sensor (FREESTYLE LIBRE SENSOR SYSTEM) MISC USE 1 SENSOR TO CHECK GLUCOSE -- CHANGE EVERY 10 DAYS 04/20/17  Yes Nida, Marella Chimes, MD  insulin aspart (NOVOLOG FLEXPEN) 100 UNIT/ML FlexPen Inject 12-18 Units into the skin 3 (three) times daily with meals. 09/30/16  Yes Nida, Marella Chimes, MD  Insulin Glargine (BASAGLAR KWIKPEN) 100 UNIT/ML SOPN Inject 50 Units into the skin at bedtime. 02/26/18  Yes [provider]  nitroGLYCERIN (NITROSTAT) 0.4 MG SL tablet DISSOLVE 1 TABLET UNDER TONGUE EVERY 5 MINUTES UP TO 15 MIN FOR CHESTPAIN. IF NO RELIEF CALL 911. 08/28/17  Yes Satira Sark, MD  pantoprazole (PROTONIX) 40 MG tablet Take 1 tablet by mouth daily.  04/11/10  Yes [provider]  traMADol (ULTRAM) 50 MG tablet Take 1 tablet (50 mg total) by mouth every 6 (six) hours as needed for moderate pain. 02/12/16  Yes Vann, Jessica U, DO  topiramate (TOPAMAX) 25 MG tablet Take 25 mg by mouth daily.  06/20/10 04/21/11  [provider]    Physical Exam: Vitals:   03/03/18 1236 03/03/18 1300 03/03/18 1330 03/03/18 1400  BP:  (!) 141/87 135/86 (!) 145/84  Pulse:  92 90 94  Resp:  18 15 19   SpO2:  96% 95% 97%  Weight: 77.1 kg     Height: 5\' 9"  (1.753 m)       Constitutional: NAD, calm, comfortable, resting supine in bed Eyes: PERRL, lids and conjunctivae normal ENMT: Mucous membranes are moist. Posterior pharynx clear of any exudate or lesions. Normal dentition.  Neck: normal, supple, no masses. Respiratory: Bibasilar inspiratory crackles.  Normal respiratory effort. No accessory muscle use.  Cardiovascular:  Regular rate and rhythm, no murmurs / rubs / gallops. No extremity edema. 2+ pedal pulses. Abdomen: no tenderness, no masses palpated. No hepatosplenomegaly. Bowel sounds positive.  Musculoskeletal: no clubbing / cyanosis. No joint deformity upper and lower extremities. Good ROM, no contractures. Normal muscle tone.  Skin: no rashes, lesions, ulcers. No induration Neurologic: CN 2-12 grossly intact with exception of right lateral visual field deficit. Sensation intact, DTR normal. Strength 5/5 in all 4.  Psychiatric: Normal judgment and insight. Alert and oriented x 3. Normal mood.    Labs on Admission: I have personally reviewed following labs and imaging studies  CBC: Recent Labs  Lab 03/03/18 1300  WBC 12.3*  NEUTROABS 9.9*  HGB 14.7  HCT 46.8  MCV 82.5  PLT 694   Basic Metabolic Panel: Recent Labs  Lab 03/03/18 1300  NA 136  K 4.4  CL 105  CO2 24  GLUCOSE 200*  BUN 13  CREATININE 0.62  CALCIUM 8.9   GFR: Estimated Creatinine Clearance: 100.6 mL/min (by C-G formula based on SCr of 0.62 mg/dL). Liver Function Tests: Recent Labs  Lab 03/03/18 1300  AST 21  ALT 23  ALKPHOS 63  BILITOT 0.5  PROT 7.1  ALBUMIN 4.2   No results for input(s): LIPASE, AMYLASE in the last 168 hours. No results for input(s): AMMONIA in the last 168 hours. Coagulation Profile: Recent Labs  Lab 03/03/18 1300  INR 0.98   Cardiac Enzymes: No results for input(s): CKTOTAL, CKMB, CKMBINDEX, TROPONINI in the last 168 hours. BNP (last 3 results) No results for input(s): PROBNP in the last 8760 hours. HbA1C: No results for input(s): HGBA1C in the last 72 hours. CBG: Recent Labs  Lab 03/03/18 1254 03/03/18 1739  GLUCAP 193* 151*   Lipid Profile: No results for input(s): CHOL, HDL, LDLCALC, TRIG, CHOLHDL, LDLDIRECT in the last 72 hours. Thyroid Function Tests: No results for input(s): TSH, T4TOTAL, FREET4, T3FREE, THYROIDAB in the last 72 hours. Anemia Panel: No results for  input(s): VITAMINB12, FOLATE, FERRITIN, TIBC, IRON, RETICCTPCT in the last 72 hours. Urine analysis:    Component Value Date/Time   COLORURINE YELLOW 03/03/2018 1430   APPEARANCEUR CLEAR 03/03/2018 1430   LABSPEC 1.021 03/03/2018 1430   PHURINE 6.0 03/03/2018 1430   GLUCOSEU 50 (A) 03/03/2018 1430   HGBUR NEGATIVE 03/03/2018 1430   BILIRUBINUR NEGATIVE 03/03/2018 1430   KETONESUR 20 (A) 03/03/2018 1430   PROTEINUR NEGATIVE 03/03/2018 1430   NITRITE NEGATIVE 03/03/2018 1430   LEUKOCYTESUR NEGATIVE 03/03/2018 1430    Radiological Exams on Admission: Ct Head Wo Contrast  Result Date: 03/03/2018 CLINICAL DATA:  Frontal headache with vomiting EXAM: CT HEAD WITHOUT CONTRAST TECHNIQUE: Contiguous axial images were obtained from the base of the skull through the vertex without intravenous contrast. COMPARISON:  Head CT February 09, 2016 and brain MRI February 10, 2016 FINDINGS: Brain: The ventricles are normal in size and configuration. There is no intracranial mass, hemorrhage, extra-axial fluid collection, or midline shift. There is evidence of a prior small infarct in the mid left occipital lobe, not acute. There is slight small vessel disease in the centra semiovale bilaterally. There is evidence of a prior small infarct in the left extreme capsule region as well as a minimal tiny infarct in the head of the caudate nucleus on the left, not acute. No acute appearing infarct is evident. Vascular: No hyperdense vessel. There is calcification in each carotid siphon region. Skull: Bony calvarium appears intact. Sinuses/Orbits: There is mucosal thickening in several ethmoid air cells. Visualized orbits appear symmetric bilaterally. Other: Mastoid air cells are clear. IMPRESSION: Several prior small infarcts. Mild periventricular small vessel disease. No acute infarct evident. No mass or hemorrhage. There are foci of arterial vascular calcification. There is mucosal thickening in several ethmoid air cells.  Electronically Signed   By: Lowella Grip III M.D.   On: 03/03/2018 13:34   Mr Jodene Nam Head Wo Contrast  Result Date: 03/03/2018 CLINICAL DATA:  Visual loss or uveitis/scleritis. Headache. History of stroke. EXAM: MRI HEAD WITHOUT CONTRAST MRA HEAD WITHOUT CONTRAST MRA NECK WITHOUT CONTRAST TECHNIQUE: Multiplanar, multiecho pulse sequences of the brain and surrounding structures were obtained without intravenous contrast. Angiographic images of the Circle of Willis were obtained using MRA technique without intravenous contrast. Angiographic images of the neck were obtained using MRA technique without intravenous contrast. Carotid stenosis measurements (when applicable) are obtained utilizing NASCET criteria, using the distal  internal carotid diameter as the denominator. COMPARISON:  CT head 03/03/2018 FINDINGS: MRI HEAD FINDINGS Brain: Small area of acute infarction in the left medial occipital lobe. Minimal associated hemorrhage. No other areas of acute infarct. Ventricle size is normal. Scattered small white matter hyperintensities compatible with chronic ischemia. Brainstem intact. Vascular: Normal arterial flow voids Skull and upper cervical spine: Negative Sinuses/Orbits: Mucosal edema paranasal sinuses.  Normal orbit Other: None MRA HEAD FINDINGS Both vertebral arteries widely patent. Basilar widely patent. Proximally and posterior cerebral arteries widely patent bilaterally. Possible stenosis or occlusion distal left PCA best seen on the source images. Internal carotid artery widely patent bilaterally. Anterior and middle cerebral arteries widely patent. Negative for cerebral aneurysm. MRA NECK FINDINGS Antegrade flow in the carotid and vertebral arteries bilaterally Carotid bifurcation widely patent bilaterally. Both vertebral arteries widely patent to the basilar. IMPRESSION: Small acute infarct in the left medial occipital lobe with slight hemorrhage. No other acute infarct. Mild chronic microvascular  ischemic change in the white matter. MRA head demonstrates possible occlusion distal left PCA corresponding to the left occipital infarct Negative MRA neck. Electronically Signed   By: Franchot Gallo M.D.   On: 03/03/2018 15:52   Mr Jodene Nam Neck Wo Contrast  Result Date: 03/03/2018 CLINICAL DATA:  Visual loss or uveitis/scleritis. Headache. History of stroke. EXAM: MRI HEAD WITHOUT CONTRAST MRA HEAD WITHOUT CONTRAST MRA NECK WITHOUT CONTRAST TECHNIQUE: Multiplanar, multiecho pulse sequences of the brain and surrounding structures were obtained without intravenous contrast. Angiographic images of the Circle of Willis were obtained using MRA technique without intravenous contrast. Angiographic images of the neck were obtained using MRA technique without intravenous contrast. Carotid stenosis measurements (when applicable) are obtained utilizing NASCET criteria, using the distal internal carotid diameter as the denominator. COMPARISON:  CT head 03/03/2018 FINDINGS: MRI HEAD FINDINGS Brain: Small area of acute infarction in the left medial occipital lobe. Minimal associated hemorrhage. No other areas of acute infarct. Ventricle size is normal. Scattered small white matter hyperintensities compatible with chronic ischemia. Brainstem intact. Vascular: Normal arterial flow voids Skull and upper cervical spine: Negative Sinuses/Orbits: Mucosal edema paranasal sinuses.  Normal orbit Other: None MRA HEAD FINDINGS Both vertebral arteries widely patent. Basilar widely patent. Proximally and posterior cerebral arteries widely patent bilaterally. Possible stenosis or occlusion distal left PCA best seen on the source images. Internal carotid artery widely patent bilaterally. Anterior and middle cerebral arteries widely patent. Negative for cerebral aneurysm. MRA NECK FINDINGS Antegrade flow in the carotid and vertebral arteries bilaterally Carotid bifurcation widely patent bilaterally. Both vertebral arteries widely patent to the  basilar. IMPRESSION: Small acute infarct in the left medial occipital lobe with slight hemorrhage. No other acute infarct. Mild chronic microvascular ischemic change in the white matter. MRA head demonstrates possible occlusion distal left PCA corresponding to the left occipital infarct Negative MRA neck. Electronically Signed   By: Franchot Gallo M.D.   On: 03/03/2018 15:52   Mr Brain Wo Contrast  Result Date: 03/03/2018 CLINICAL DATA:  Visual loss or uveitis/scleritis. Headache. History of stroke. EXAM: MRI HEAD WITHOUT CONTRAST MRA HEAD WITHOUT CONTRAST MRA NECK WITHOUT CONTRAST TECHNIQUE: Multiplanar, multiecho pulse sequences of the brain and surrounding structures were obtained without intravenous contrast. Angiographic images of the Circle of Willis were obtained using MRA technique without intravenous contrast. Angiographic images of the neck were obtained using MRA technique without intravenous contrast. Carotid stenosis measurements (when applicable) are obtained utilizing NASCET criteria, using the distal internal carotid diameter as the denominator. COMPARISON:  CT  head 03/03/2018 FINDINGS: MRI HEAD FINDINGS Brain: Small area of acute infarction in the left medial occipital lobe. Minimal associated hemorrhage. No other areas of acute infarct. Ventricle size is normal. Scattered small white matter hyperintensities compatible with chronic ischemia. Brainstem intact. Vascular: Normal arterial flow voids Skull and upper cervical spine: Negative Sinuses/Orbits: Mucosal edema paranasal sinuses.  Normal orbit Other: None MRA HEAD FINDINGS Both vertebral arteries widely patent. Basilar widely patent. Proximally and posterior cerebral arteries widely patent bilaterally. Possible stenosis or occlusion distal left PCA best seen on the source images. Internal carotid artery widely patent bilaterally. Anterior and middle cerebral arteries widely patent. Negative for cerebral aneurysm. MRA NECK FINDINGS Antegrade  flow in the carotid and vertebral arteries bilaterally Carotid bifurcation widely patent bilaterally. Both vertebral arteries widely patent to the basilar. IMPRESSION: Small acute infarct in the left medial occipital lobe with slight hemorrhage. No other acute infarct. Mild chronic microvascular ischemic change in the white matter. MRA head demonstrates possible occlusion distal left PCA corresponding to the left occipital infarct Negative MRA neck. Electronically Signed   By: Franchot Gallo M.D.   On: 03/03/2018 15:52    EKG: Independently reviewed. Sinus rhythm, incomplete RBBB  Assessment/Plan Principal Problem:   CVA (cerebral vascular accident) ([place]) Active Problems:   CORONARY ATHEROSCLEROSIS NATIVE CORONARY ARTERY   Type 2 diabetes mellitus with vascular disease ([place])  Nicholas [name] is a 58 y.o. male with medical history significant for insulin-dependent type 2 diabetes, CAD s/p DES, hyperlipidemia, hx of left occipital CVA who presents to the ED with complaint of headache found to have a small left medial occipital lobe acute infarct with slight hemorrhage.  Left medial occipital lobe acute infarct: Questionable new right lateral field deficit, otherwise no other neurological deficit on admission.  Patient has ILR in place with no issues on last device check 02/01/2018. -Admit to telemetry -Inpatient neurology consult in a.m. -Continue aspirin and Plavix -Start atorvastatin 40 mg -Echocardiogram -PT/OT/SLP eval -Check A1c and lipid panel  CAD s/p DES: Denies active chest pain. -Continue aspirin and Plavix -Start atorvastatin as above  Insulin-dependent type 2 diabetes: On Basaglar 50 units at night and 12-18 units NovoLog with meals TID as an outpatient. -Reduce home Lantus 30 units nightly and sensitive SSI while inpatient, adjust as needed  DVT prophylaxis: SCDs Code Status: Full code Family Communication: Discussed with wife at bedside Disposition Plan: Pending stroke  work-up Consults called: Telemetry neurology consulted by EDP Admission status: Observation   Zada Finders MD [facility] Pager [phone]  If 7PM-7AM, please contact night-coverage [email or web address]  03/03/2018, 5:48 PM

## 2018-03-04 ENCOUNTER — Observation Stay (HOSPITAL_BASED_OUTPATIENT_CLINIC_OR_DEPARTMENT_OTHER): Payer: BLUE CROSS/BLUE SHIELD

## 2018-03-04 ENCOUNTER — Other Ambulatory Visit: Payer: Self-pay

## 2018-03-04 DIAGNOSIS — E785 Hyperlipidemia, unspecified: Secondary | ICD-10-CM | POA: Diagnosis not present

## 2018-03-04 DIAGNOSIS — Z720 Tobacco use: Secondary | ICD-10-CM

## 2018-03-04 DIAGNOSIS — H547 Unspecified visual loss: Secondary | ICD-10-CM | POA: Diagnosis present

## 2018-03-04 DIAGNOSIS — I252 Old myocardial infarction: Secondary | ICD-10-CM | POA: Diagnosis not present

## 2018-03-04 DIAGNOSIS — I10 Essential (primary) hypertension: Secondary | ICD-10-CM | POA: Diagnosis present

## 2018-03-04 DIAGNOSIS — I2510 Atherosclerotic heart disease of native coronary artery without angina pectoris: Secondary | ICD-10-CM

## 2018-03-04 DIAGNOSIS — I6529 Occlusion and stenosis of unspecified carotid artery: Secondary | ICD-10-CM | POA: Diagnosis present

## 2018-03-04 DIAGNOSIS — E1159 Type 2 diabetes mellitus with other circulatory complications: Secondary | ICD-10-CM

## 2018-03-04 DIAGNOSIS — F17210 Nicotine dependence, cigarettes, uncomplicated: Secondary | ICD-10-CM | POA: Diagnosis present

## 2018-03-04 DIAGNOSIS — Z833 Family history of diabetes mellitus: Secondary | ICD-10-CM | POA: Diagnosis not present

## 2018-03-04 DIAGNOSIS — H5340 Unspecified visual field defects: Secondary | ICD-10-CM | POA: Diagnosis present

## 2018-03-04 DIAGNOSIS — Z7982 Long term (current) use of aspirin: Secondary | ICD-10-CM | POA: Diagnosis not present

## 2018-03-04 DIAGNOSIS — Z8719 Personal history of other diseases of the digestive system: Secondary | ICD-10-CM | POA: Diagnosis not present

## 2018-03-04 DIAGNOSIS — Z794 Long term (current) use of insulin: Secondary | ICD-10-CM | POA: Diagnosis not present

## 2018-03-04 DIAGNOSIS — H209 Unspecified iridocyclitis: Secondary | ICD-10-CM | POA: Diagnosis present

## 2018-03-04 DIAGNOSIS — K449 Diaphragmatic hernia without obstruction or gangrene: Secondary | ICD-10-CM | POA: Diagnosis present

## 2018-03-04 DIAGNOSIS — E782 Mixed hyperlipidemia: Secondary | ICD-10-CM | POA: Diagnosis present

## 2018-03-04 DIAGNOSIS — K219 Gastro-esophageal reflux disease without esophagitis: Secondary | ICD-10-CM | POA: Diagnosis present

## 2018-03-04 DIAGNOSIS — E1151 Type 2 diabetes mellitus with diabetic peripheral angiopathy without gangrene: Secondary | ICD-10-CM | POA: Diagnosis present

## 2018-03-04 DIAGNOSIS — I6629 Occlusion and stenosis of unspecified posterior cerebral artery: Secondary | ICD-10-CM | POA: Diagnosis present

## 2018-03-04 DIAGNOSIS — Z7902 Long term (current) use of antithrombotics/antiplatelets: Secondary | ICD-10-CM | POA: Diagnosis not present

## 2018-03-04 DIAGNOSIS — I639 Cerebral infarction, unspecified: Secondary | ICD-10-CM

## 2018-03-04 DIAGNOSIS — Z8249 Family history of ischemic heart disease and other diseases of the circulatory system: Secondary | ICD-10-CM | POA: Diagnosis not present

## 2018-03-04 DIAGNOSIS — E1165 Type 2 diabetes mellitus with hyperglycemia: Secondary | ICD-10-CM | POA: Diagnosis present

## 2018-03-04 DIAGNOSIS — Z8673 Personal history of transient ischemic attack (TIA), and cerebral infarction without residual deficits: Secondary | ICD-10-CM | POA: Diagnosis not present

## 2018-03-04 DIAGNOSIS — Z955 Presence of coronary angioplasty implant and graft: Secondary | ICD-10-CM | POA: Diagnosis not present

## 2018-03-04 DIAGNOSIS — Z809 Family history of malignant neoplasm, unspecified: Secondary | ICD-10-CM | POA: Diagnosis not present

## 2018-03-04 LAB — LIPID PANEL
CHOL/HDL RATIO: 4.7 ratio
Cholesterol: 151 mg/dL (ref 0–200)
HDL: 32 mg/dL — ABNORMAL LOW (ref >40)
LDL Cholesterol: 103 mg/dL — ABNORMAL HIGH (ref 0–99)
Triglycerides: 81 mg/dL (ref <150)
VLDL: 16 mg/dL (ref 0–40)

## 2018-03-04 LAB — ECHOCARDIOGRAM COMPLETE
Height: 69 in
Weight: 2560.86 oz

## 2018-03-04 LAB — GLUCOSE, CAPILLARY
Glucose-Capillary: 121 mg/dL — ABNORMAL HIGH (ref 70–99)
Glucose-Capillary: 123 mg/dL — ABNORMAL HIGH (ref 70–99)
Glucose-Capillary: 109 mg/dL — ABNORMAL HIGH (ref 70–99)

## 2018-03-04 LAB — HIV ANTIBODY (ROUTINE TESTING W REFLEX): HIV Screen 4th Generation wRfx: NONREACTIVE

## 2018-03-04 LAB — HEMOGLOBIN A1C
Hgb A1c MFr Bld: 9.3 % — ABNORMAL HIGH (ref 4.8–5.6)
MEAN PLASMA GLUCOSE: 220.21 mg/dL

## 2018-03-04 MED ORDER — ASPIRIN EC 325 MG PO TBEC: 325.0000 mg | DELAYED_RELEASE_TABLET | Freq: Every day | ORAL | 3 refills | Status: AC

## 2018-03-04 MED ORDER — ATORVASTATIN CALCIUM 40 MG PO TABS: 40.0000 mg | ORAL_TABLET | Freq: Every day | ORAL | 3 refills | Status: DC

## 2018-03-04 NOTE — Discharge Summary (Signed)
Physician Discharge Summary  Nicholas [name] OVF:[id number] DOB: 1960/06/29 DOA: 03/03/2018  PCP: Sharilyn Sites, MD  Admit date: 03/03/2018 Discharge date: 03/04/2018  Time spent: 35 minutes  Recommendations for Outpatient Follow-up:  1. Reassess blood pressure and further adjust antihypertensive regimen as needed 2. Follow lipid profile and LFTs in 6-8 weeks as patient has been started on statins. 3. Continue assisting patient with a smoking cessation   Discharge Diagnoses:  Principal Problem:   Acute ischemic stroke ([place]) Active Problems:   CORONARY ATHEROSCLEROSIS NATIVE CORONARY ARTERY   Type 2 diabetes mellitus with vascular disease ([place]) Hyperlipidemia Tobacco abuse Hypertension GERD  Discharge Condition: Stable and improved.  Patient discharged home with instruction to follow-up with neurologist in 6 weeks and with PCP in 2 weeks.  Diet recommendation: Heart healthy and modified carbohydrate diet.  Filed Weights   03/03/18 1236 03/04/18 0352  Weight: 77.1 kg 72.6 kg    History of present illness:  As per H&P written by Dr. Posey Pronto on 03/03/2018 58 y.o. male with medical history significant for insulin-dependent type 2 diabetes, CAD s/p DES, hyperlipidemia, hx of left occipital CVA who presents to the ED with complaint of headache.  Patient states he was in his usual state of health until about 3 days ago when he developed new onset frontal headache which has been consistent.  He reports one episode of nausea with emesis which has resolved.  He denies any associated changes in vision, chest pain, dyspnea, numbness/tingling, or focal weakness.  He has been on DAPT with aspirin and Plavix which he reports taking.  He states that he is not currently on statin therapy due to adherence issues.  Hospital Course:  1-acute ischemic left medial occipital lobe infarct -Appears to be associated with small vessel disease -Echo reassuring demonstrating preserved ejection fraction, no wall  motion abnormalities or significant valvular disorder -MRI 8 demonstrating distal PCA occlusion; otherwise antegrade flow and patency of arteries delivering blood to the brain. -Case discussed with neurologist who has recommended dual antiplatelet therapy with full dose aspirin, Plavix and also resumption of statins. -Patient was also advised to quit smoking and to take better control of his blood sugar (A1c 9.3). -He will follow-up with Dr. Merlene Laughter (neurologist) in about 6 weeks as an outpatient.  2-coronary artery disease -Denies chest pain or shortness of breath -Continue the use of aspirin, Plavix, and a starting statins as mentioned above.  3-hyperlipidemia -LDL 103 -Patient has been discharged on Lipitor 40 mg by mouth daily. -Heart healthy diet has been encouraged.  4-type 2 diabetes mellitus -A1c 9.3; demonstrating poor control with hyperglycemia -Modified carbohydrate diet has been recommended -Patient will resume home insulin therapy to control sugar and will follow up with endocrinologist for further adjustment on his medication regimen.  5-tobacco abuse -I have discussed tobacco cessation with the patient.  I have counseled the patient regarding the negative impacts of continued tobacco use including but not limited to lung cancer, COPD, and cardiovascular disease.  I have discussed alternatives to tobacco and modalities that may help facilitate tobacco cessation including but not limited to biofeedback, hypnosis, and medications.  Total time spent with tobacco counseling was 4 minutes. -nicotine patch encouraged.  6-GERD -continue PPI  7-essential hypertension Stable and well-controlled -Continue current antihypertensive regimen.  Procedures:  See below for x-ray reports  2D echo:  1. The left ventricle has normal systolic function of 84-16%. The cavity size was normal. There is no increased left ventricular wall thickness. Echo evidence of  normal diastolic  relaxation.  2. No evidence of left ventricular regional wall motion abnormalities.  3. The right ventricle has normal systolic function. The cavity was normal. There is no increase in right ventricular wall thickness. Right ventricular systolic pressure could not be assessed.  4. The aortic valve is tricuspid. There is moderate calcification of the aortic valve. There is mild to moderate aortic annular calcification noted.  5. The mitral valve is normal in structure. There is mild calcification.  6. The tricuspid valve is normal in structure.  7. The aortic root is normal in size and structure.  Consultations:  Neurology service (Dr. Merlene Laughter; patient case discussed over the phone and after reviewing his records, decision made for outpatient follow up in 6 weeks, continue dual antiplatelet therapy with full dose ASA and Plavix and to re-start statins).  Discharge Exam: Vitals:   03/04/18 0950 03/04/18 1250  BP: 139/82 (!) 141/79  Pulse: 100 89  Resp:    Temp: 98.1 F (36.7 C) 98.1 F (36.7 C)  SpO2: 95% 96%    General: Afebrile, no chest pain, shortness of breath, no nausea, no vomiting.  Patient continued to have mild right peripheral visual field impair; no other focal neurologic deficits appreciated on exam. Cardiovascular: S1 and S2, no rubs, no gallops, no murmurs. Respiratory: Good air movement bilaterally, no wheezing, no crackles, no using accessory muscles. Abdomen: Soft, nontender, distended, positive bowel sounds. Extremities: No edema, no cyanosis, no clubbing.  Discharge Instructions   Discharge Instructions    Diet - low sodium heart healthy   Complete by:  As directed    Diet Carb Modified   Complete by:  As directed    Discharge instructions   Complete by:  As directed    Take medications as prescribed Follow heart healthy modify carbohydrate diets Keep yourself well-hydrated Start using a full dose aspirin (325 mg) daily and also take your Plavix 75 mg  daily. Stop smoking Be compliant with medication for cholesterol (Lipitor). Arrange follow-up with neurologist in 6 weeks as instructed. Follow-up with PCP in 2 weeks after discharge for hospital follow-up assessment.     Allergies as of 03/04/2018   No Known Allergies     Medication List    TAKE these medications   aspirin EC 325 MG tablet Take 1 tablet (325 mg total) by mouth daily. What changed:    medication strength  how much to take   atorvastatin 40 MG tablet Commonly known as:  LIPITOR Take 1 tablet (40 mg total) by mouth daily at 6 PM.   BASAGLAR KWIKPEN 100 UNIT/ML Sopn Inject 50 Units into the skin at bedtime.   BD PEN NEEDLE NANO U/F 32G X 4 MM Misc Generic drug:  Insulin Pen Needle USE AS DIRECTED FOUR TIMES DAILY.   clopidogrel 75 MG tablet Commonly known as:  PLAVIX TAKE (1) TABLET BY MOUTH ONCE DAILY.   FREESTYLE LIBRE SENSOR SYSTEM Misc USE 1 SENSOR TO CHECK GLUCOSE -- CHANGE EVERY 10 DAYS   insulin aspart 100 UNIT/ML FlexPen Commonly known as:  NOVOLOG FLEXPEN Inject 12-18 Units into the skin 3 (three) times daily with meals.   nitroGLYCERIN 0.4 MG SL tablet Commonly known as:  NITROSTAT DISSOLVE 1 TABLET UNDER TONGUE EVERY 5 MINUTES UP TO 15 MIN FOR CHESTPAIN. IF NO RELIEF CALL 911.   pantoprazole 40 MG tablet Commonly known as:  PROTONIX Take 1 tablet by mouth daily.   traMADol 50 MG tablet Commonly known as:  ULTRAM Take 1  tablet (50 mg total) by mouth every 6 (six) hours as needed for moderate pain.      No Known Allergies Follow-up Information    Sharilyn Sites, MD. Schedule an appointment as soon as possible for a visit in 2 week(s).   Specialty:  Family Medicine Contact information: [address] [place] [address] [phone]        Phillips Odor, MD. Schedule an appointment as soon as possible for a visit in 6 week(s).   Specialty:  Neurology Contact information: [address] [place]  [address] [phone]           The results of significant diagnostics from this hospitalization (including imaging, microbiology, ancillary and laboratory) are listed below for reference.    Significant Diagnostic Studies: Ct Head Wo Contrast  Result Date: 03/03/2018 CLINICAL DATA:  Frontal headache with vomiting EXAM: CT HEAD WITHOUT CONTRAST TECHNIQUE: Contiguous axial images were obtained from the base of the skull through the vertex without intravenous contrast. COMPARISON:  Head CT February 09, 2016 and brain MRI February 10, 2016 FINDINGS: Brain: The ventricles are normal in size and configuration. There is no intracranial mass, hemorrhage, extra-axial fluid collection, or midline shift. There is evidence of a prior small infarct in the mid left occipital lobe, not acute. There is slight small vessel disease in the centra semiovale bilaterally. There is evidence of a prior small infarct in the left extreme capsule region as well as a minimal tiny infarct in the head of the caudate nucleus on the left, not acute. No acute appearing infarct is evident. Vascular: No hyperdense vessel. There is calcification in each carotid siphon region. Skull: Bony calvarium appears intact. Sinuses/Orbits: There is mucosal thickening in several ethmoid air cells. Visualized orbits appear symmetric bilaterally. Other: Mastoid air cells are clear. IMPRESSION: Several prior small infarcts. Mild periventricular small vessel disease. No acute infarct evident. No mass or hemorrhage. There are foci of arterial vascular calcification. There is mucosal thickening in several ethmoid air cells. Electronically Signed   By: Lowella Grip III M.D.   On: 03/03/2018 13:34   Mr Jodene Nam Head Wo Contrast  Result Date: 03/03/2018 CLINICAL DATA:  Visual loss or uveitis/scleritis. Headache. History of stroke. EXAM: MRI HEAD WITHOUT CONTRAST MRA HEAD WITHOUT CONTRAST MRA NECK WITHOUT CONTRAST TECHNIQUE: Multiplanar, multiecho pulse  sequences of the brain and surrounding structures were obtained without intravenous contrast. Angiographic images of the Circle of Willis were obtained using MRA technique without intravenous contrast. Angiographic images of the neck were obtained using MRA technique without intravenous contrast. Carotid stenosis measurements (when applicable) are obtained utilizing NASCET criteria, using the distal internal carotid diameter as the denominator. COMPARISON:  CT head 03/03/2018 FINDINGS: MRI HEAD FINDINGS Brain: Small area of acute infarction in the left medial occipital lobe. Minimal associated hemorrhage. No other areas of acute infarct. Ventricle size is normal. Scattered small white matter hyperintensities compatible with chronic ischemia. Brainstem intact. Vascular: Normal arterial flow voids Skull and upper cervical spine: Negative Sinuses/Orbits: Mucosal edema paranasal sinuses.  Normal orbit Other: None MRA HEAD FINDINGS Both vertebral arteries widely patent. Basilar widely patent. Proximally and posterior cerebral arteries widely patent bilaterally. Possible stenosis or occlusion distal left PCA best seen on the source images. Internal carotid artery widely patent bilaterally. Anterior and middle cerebral arteries widely patent. Negative for cerebral aneurysm. MRA NECK FINDINGS Antegrade flow in the carotid and vertebral arteries bilaterally Carotid bifurcation widely patent bilaterally. Both vertebral arteries widely patent to the basilar. IMPRESSION: Small  acute infarct in the left medial occipital lobe with slight hemorrhage. No other acute infarct. Mild chronic microvascular ischemic change in the white matter. MRA head demonstrates possible occlusion distal left PCA corresponding to the left occipital infarct Negative MRA neck. Electronically Signed   By: Franchot Gallo M.D.   On: 03/03/2018 15:52   Mr Jodene Nam Neck Wo Contrast  Result Date: 03/03/2018 CLINICAL DATA:  Visual loss or uveitis/scleritis.  Headache. History of stroke. EXAM: MRI HEAD WITHOUT CONTRAST MRA HEAD WITHOUT CONTRAST MRA NECK WITHOUT CONTRAST TECHNIQUE: Multiplanar, multiecho pulse sequences of the brain and surrounding structures were obtained without intravenous contrast. Angiographic images of the Circle of Willis were obtained using MRA technique without intravenous contrast. Angiographic images of the neck were obtained using MRA technique without intravenous contrast. Carotid stenosis measurements (when applicable) are obtained utilizing NASCET criteria, using the distal internal carotid diameter as the denominator. COMPARISON:  CT head 03/03/2018 FINDINGS: MRI HEAD FINDINGS Brain: Small area of acute infarction in the left medial occipital lobe. Minimal associated hemorrhage. No other areas of acute infarct. Ventricle size is normal. Scattered small white matter hyperintensities compatible with chronic ischemia. Brainstem intact. Vascular: Normal arterial flow voids Skull and upper cervical spine: Negative Sinuses/Orbits: Mucosal edema paranasal sinuses.  Normal orbit Other: None MRA HEAD FINDINGS Both vertebral arteries widely patent. Basilar widely patent. Proximally and posterior cerebral arteries widely patent bilaterally. Possible stenosis or occlusion distal left PCA best seen on the source images. Internal carotid artery widely patent bilaterally. Anterior and middle cerebral arteries widely patent. Negative for cerebral aneurysm. MRA NECK FINDINGS Antegrade flow in the carotid and vertebral arteries bilaterally Carotid bifurcation widely patent bilaterally. Both vertebral arteries widely patent to the basilar. IMPRESSION: Small acute infarct in the left medial occipital lobe with slight hemorrhage. No other acute infarct. Mild chronic microvascular ischemic change in the white matter. MRA head demonstrates possible occlusion distal left PCA corresponding to the left occipital infarct Negative MRA neck. Electronically Signed    By: Franchot Gallo M.D.   On: 03/03/2018 15:52   Mr Brain Wo Contrast  Result Date: 03/03/2018 CLINICAL DATA:  Visual loss or uveitis/scleritis. Headache. History of stroke. EXAM: MRI HEAD WITHOUT CONTRAST MRA HEAD WITHOUT CONTRAST MRA NECK WITHOUT CONTRAST TECHNIQUE: Multiplanar, multiecho pulse sequences of the brain and surrounding structures were obtained without intravenous contrast. Angiographic images of the Circle of Willis were obtained using MRA technique without intravenous contrast. Angiographic images of the neck were obtained using MRA technique without intravenous contrast. Carotid stenosis measurements (when applicable) are obtained utilizing NASCET criteria, using the distal internal carotid diameter as the denominator. COMPARISON:  CT head 03/03/2018 FINDINGS: MRI HEAD FINDINGS Brain: Small area of acute infarction in the left medial occipital lobe. Minimal associated hemorrhage. No other areas of acute infarct. Ventricle size is normal. Scattered small white matter hyperintensities compatible with chronic ischemia. Brainstem intact. Vascular: Normal arterial flow voids Skull and upper cervical spine: Negative Sinuses/Orbits: Mucosal edema paranasal sinuses.  Normal orbit Other: None MRA HEAD FINDINGS Both vertebral arteries widely patent. Basilar widely patent. Proximally and posterior cerebral arteries widely patent bilaterally. Possible stenosis or occlusion distal left PCA best seen on the source images. Internal carotid artery widely patent bilaterally. Anterior and middle cerebral arteries widely patent. Negative for cerebral aneurysm. MRA NECK FINDINGS Antegrade flow in the carotid and vertebral arteries bilaterally Carotid bifurcation widely patent bilaterally. Both vertebral arteries widely patent to the basilar. IMPRESSION: Small acute infarct in the left medial occipital lobe with  slight hemorrhage. No other acute infarct. Mild chronic microvascular ischemic change in the white  matter. MRA head demonstrates possible occlusion distal left PCA corresponding to the left occipital infarct Negative MRA neck. Electronically Signed   By: Franchot Gallo M.D.   On: 03/03/2018 15:52   Labs: Basic Metabolic Panel: Recent Labs  Lab 03/03/18 1300  NA 136  K 4.4  CL 105  CO2 24  GLUCOSE 200*  BUN 13  CREATININE 0.62  CALCIUM 8.9   Liver Function Tests: Recent Labs  Lab 03/03/18 1300  AST 21  ALT 23  ALKPHOS 63  BILITOT 0.5  PROT 7.1  ALBUMIN 4.2   CBC: Recent Labs  Lab 03/03/18 1300  WBC 12.3*  NEUTROABS 9.9*  HGB 14.7  HCT 46.8  MCV 82.5  PLT 167   CBG: Recent Labs  Lab 03/03/18 1739 03/03/18 2120 03/04/18 0409 03/04/18 0731 03/04/18 1129  GLUCAP 151* 98 121* 123* 109*    Signed:  Barton Dubois MD.  [facility] 03/04/2018, 3:16 PM

## 2018-03-04 NOTE — Plan of Care (Signed)
  Problem: Education: Goal: Knowledge of General Education information will improve Description Including pain rating scale, medication(s)/side effects and non-pharmacologic comfort measures 03/04/2018 1538 by Rance Muir, RN Outcome: Adequate for Discharge 03/04/2018 0940 by Rance Muir, RN Outcome: Progressing   Problem: Health Behavior/Discharge Planning: Goal: Ability to manage health-related needs will improve 03/04/2018 1538 by Rance Muir, RN Outcome: Adequate for Discharge 03/04/2018 0940 by Rance Muir, RN Outcome: Progressing   Problem: Clinical Measurements: Goal: Ability to maintain clinical measurements within normal limits will improve 03/04/2018 1538 by Rance Muir, RN Outcome: Adequate for Discharge 03/04/2018 0940 by Rance Muir, RN Outcome: Progressing Goal: Will remain free from infection 03/04/2018 1538 by Rance Muir, RN Outcome: Adequate for Discharge 03/04/2018 0940 by Rance Muir, RN Outcome: Progressing Goal: Diagnostic test results will improve 03/04/2018 1538 by Rance Muir, RN Outcome: Adequate for Discharge 03/04/2018 0940 by Rance Muir, RN Outcome: Progressing Goal: Respiratory complications will improve 03/04/2018 1538 by Rance Muir, RN Outcome: Adequate for Discharge 03/04/2018 0940 by Rance Muir, RN Outcome: Progressing Goal: Cardiovascular complication will be avoided 03/04/2018 1538 by Rance Muir, RN Outcome: Adequate for Discharge 03/04/2018 0940 by Rance Muir, RN Outcome: Progressing   Problem: Activity: Goal: Risk for activity intolerance will decrease 03/04/2018 1538 by Rance Muir, RN Outcome: Adequate for Discharge 03/04/2018 0940 by Rance Muir, RN Outcome: Progressing   Problem: Nutrition: Goal: Adequate nutrition will be maintained 03/04/2018 1538 by Rance Muir, RN Outcome: Adequate for Discharge 03/04/2018 0940 by Rance Muir, RN Outcome: Progressing   Problem: Coping: Goal: Level of anxiety will decrease 03/04/2018 1538 by Rance Muir, RN Outcome: Adequate for Discharge 03/04/2018  0940 by Rance Muir, RN Outcome: Progressing   Problem: Elimination: Goal: Will not experience complications related to bowel motility 03/04/2018 1538 by Rance Muir, RN Outcome: Adequate for Discharge 03/04/2018 0940 by Rance Muir, RN Outcome: Progressing Goal: Will not experience complications related to urinary retention 03/04/2018 1538 by Rance Muir, RN Outcome: Adequate for Discharge 03/04/2018 0940 by Rance Muir, RN Outcome: Progressing   Problem: Pain Managment: Goal: General experience of comfort will improve 03/04/2018 1538 by Rance Muir, RN Outcome: Adequate for Discharge 03/04/2018 0940 by Rance Muir, RN Outcome: Progressing   Problem: Safety: Goal: Ability to remain free from injury will improve 03/04/2018 1538 by Rance Muir, RN Outcome: Adequate for Discharge 03/04/2018 0940 by Rance Muir, RN Outcome: Progressing   Problem: Skin Integrity: Goal: Risk for impaired skin integrity will decrease 03/04/2018 1538 by Rance Muir, RN Outcome: Adequate for Discharge 03/04/2018 0940 by Rance Muir, RN Outcome: Progressing

## 2018-03-04 NOTE — Progress Notes (Signed)
SLP Cancellation Note  Patient Details Name: Nicholas [name] MRN: [id number] DOB: Aug 17, 1960   Cancelled treatment:       Reason Eval/Treat Not Completed: SLP screened, no needs identified, will sign off; SLP screened Pt in room. Pt denies any changes in swallowing, speech, language, or cognition. SLE will be deferred at this time. Reconsult if indicated. SLP will sign off.  Please note that Pt is still NPO and he passed the [place] swallow screen in the ED yesterday. SLP alerted RN to ask MD if diet could be ordered as Pt is hungry.   Thank you,  Genene Churn, [place]  [place] 03/04/2018, 1:46 PM

## 2018-03-04 NOTE — Evaluation (Signed)
Physical Therapy Evaluation Patient Details Name: Nicholas [name] MRN: [id number] DOB: [birth year] Today's Date: 03/04/2018   History of Present Illness  Nicholas [name] is a 58 y.o. male with medical history significant for insulin-dependent type 2 diabetes, CAD s/p DES, hyperlipidemia, hx of left occipital CVA who presents to the ED with complaint of headache.  Patient states he was in his usual state of health until about 3 days ago when he developed new onset frontal headache which has been consistent.  He reports one episode of nausea with emesis which has resolved.  He denies any associated changes in vision, chest pain, dyspnea, numbness/tingling, or focal weakness.  He has been on DAPT with aspirin and Plavix which he reports taking.  He states that he is not currently on statin therapy due to adherence issues.    Clinical Impression  Patient functioning at baseline for functional mobility and gait only c/o frontal headache and has new severely limited left side peripheral vision, able to track to the left with both eye balls to see objects on the left side and had no problem making left/right turns or turning completely around during gait training.  Plan:  Patient discharged from physical therapy to care of nursing for ambulation daily as tolerated for length of stay.    Follow Up Recommendations No PT follow up    Equipment Recommendations  None recommended by PT    Recommendations for Other Services       Precautions / Restrictions Precautions Precautions: None Restrictions Weight Bearing Restrictions: No      Mobility  Bed Mobility Overal bed mobility: Independent                Transfers Overall transfer level: Independent                  Ambulation/Gait Ambulation/Gait assistance: Independent Gait Distance (Feet): 300 Feet Assistive device: None Gait Pattern/deviations: WFL(Within Functional Limits) Gait velocity: normal   General Gait Details: good  return for ambulaton on level, inclined, and declined surfaces without loss of balance  Stairs            Wheelchair Mobility    Modified Rankin (Stroke Patients Only)       Balance Overall balance assessment: Independent                                           Pertinent Vitals/Pain Pain Assessment: 0-10 Pain Score: 7  Pain Location: frontal headache Pain Descriptors / Indicators: Aching Pain Intervention(s): Limited activity within patient's tolerance;Monitored during session    Home Living Family/patient expects to be discharged to:: Private residence Living Arrangements: Spouse/significant other Available Help at Discharge: Family;Available PRN/intermittently Type of Home: House Home Access: Level entry     Home Layout: One level Home Equipment: None      Prior Function Level of Independence: Independent         Comments: community ambulator, drives     Hand Dominance   Dominant Hand: Right    Extremity/Trunk Assessment   Upper Extremity Assessment Upper Extremity Assessment: Overall WFL for tasks assessed    Lower Extremity Assessment Lower Extremity Assessment: Overall WFL for tasks assessed    Cervical / Trunk Assessment Cervical / Trunk Assessment: Normal  Communication   Communication: No difficulties  Cognition Arousal/Alertness: Awake/alert Behavior During Therapy: WFL for tasks assessed/performed Overall Cognitive Status: Within  Functional Limits for tasks assessed                                        General Comments      Exercises     Assessment/Plan    PT Assessment Patent does not need any further PT services  PT Problem List         PT Treatment Interventions      PT Goals (Current goals can be found in the Care Plan section)  Acute Rehab PT Goals Patient Stated Goal: return home PT Goal Formulation: With patient Time For Goal Achievement: 03/04/18 Potential to Achieve  Goals: Good    Frequency     Barriers to discharge        Co-evaluation               AM-PAC PT "6 Clicks" Mobility  Outcome Measure Help needed turning from your back to your side while in a flat bed without using bedrails?: None Help needed moving from lying on your back to sitting on the side of a flat bed without using bedrails?: None Help needed moving to and from a bed to a chair (including a wheelchair)?: None Help needed standing up from a chair using your arms (e.g., wheelchair or bedside chair)?: None Help needed to walk in hospital room?: None Help needed climbing 3-5 steps with a railing? : None 6 Click Score: 24    End of Session   Activity Tolerance: Patient tolerated treatment well Patient left: in bed;with call bell/phone within reach Nurse Communication: Mobility status PT Visit Diagnosis: Unsteadiness on feet (R26.81);Other abnormalities of gait and mobility (R26.89);Muscle weakness (generalized) (M62.81)    Time: 2542-7062 PT Time Calculation (min) (ACUTE ONLY): 22 min   Charges:   PT Evaluation $PT Eval Moderate Complexity: 1 Mod PT Treatments $Gait Training: 8-22 mins        12:28 PM, 03/04/18 Lonell Grandchild, MPT Physical Therapist with [facility] [phone] office [phone] mobile phone

## 2018-03-04 NOTE — ED Notes (Signed)
Report given to [facility] on 300

## 2018-03-04 NOTE — Progress Notes (Signed)
*  PRELIMINARY RESULTS* Echocardiogram 2D Echocardiogram has been performed.  Nicholas [name] 03/04/2018, 11:00 AM

## 2018-03-04 NOTE — Plan of Care (Signed)

## 2018-03-04 NOTE — Progress Notes (Addendum)
Inpatient Diabetes Program Recommendations  AACE/ADA: New Consensus Statement on Inpatient Glycemic Control (2015)  Target Ranges:  Prepandial:   less than 140 mg/dL      Peak postprandial:   less than 180 mg/dL (1-2 hours)      Critically ill patients:  140 - 180 mg/dL    Review of Glycemic Control  Diabetes history: DM 2 Outpatient Diabetes medications: Basaglar 50 units, Novolog 12-18 units tid  A1c 9.3% on 2/6  Spoke with patient over the phone regarding glucose control at home. Patient sees Dr. Dorris Fetch, outpatient for glucose control. The last time patient saw Dr. Dorris Fetch was 09/03/2017. His A1c at that time was 7.3%. Patient uses a [employer] device fo glucose checks at home.  Patient reports seeing his trends increasing lately but said he used a secondary insulin to get his levels down. Discussed nothing has changed except for being less active in the winter time.  Discussed how the increase in his A1c placed an increased risk for stroke. Discussed how diabetes effects circulation.  Patient reports having another appointment with Dr. Dorris Fetch this month. Discussed with patient to follow up at that time and to call Dr. Dorris Fetch in between appointments for adjustments in regimen if needed.  Thanks,  Tama Headings RN, MSN, BC-ADM Inpatient Diabetes Coordinator Team Pager [phone] (8a-5p)

## 2018-03-08 ENCOUNTER — Ambulatory Visit (INDEPENDENT_AMBULATORY_CARE_PROVIDER_SITE_OTHER): Payer: BLUE CROSS/BLUE SHIELD

## 2018-03-08 DIAGNOSIS — I639 Cerebral infarction, unspecified: Secondary | ICD-10-CM | POA: Diagnosis not present

## 2018-03-08 LAB — CUP PACEART REMOTE DEVICE CHECK
Date Time Interrogation Session: 20200209011118
MDC IDC PG IMPLANT DT: 20180116

## 2018-03-18 NOTE — Progress Notes (Signed)
[facility]link Summary Report / Loop Recorder 

## 2018-03-29 ENCOUNTER — Ambulatory Visit: Payer: BLUE CROSS/BLUE SHIELD | Admitting: Podiatrist

## 2018-03-29 ENCOUNTER — Encounter: Payer: Self-pay | Admitting: Podiatrist

## 2018-03-29 VITALS — BP 129/80 | HR 86

## 2018-03-29 DIAGNOSIS — L600 Ingrowing nail: Secondary | ICD-10-CM | POA: Diagnosis not present

## 2018-03-29 NOTE — Patient Instructions (Signed)

## 2018-03-29 NOTE — Progress Notes (Signed)
  Chief Complaint  Patient presents with  . Nail Problem    Right 1st nail lateral side ingrown, no drainage, some redness and swelling, very tender.  . Nail Problem    Yellow discoloration nails 1-5  . Diabetes Mellitus    Diabetic foot exam     HPI: Patient is 58 y.o. male who presents today for pain lateral side of the right hallux nail.. relates it has been painful and today it is better.  He also wonders about the yellowing of the nails and is here for a general diabetic foot exam    Review of Systems  DATA OBTAINED: from patient  GENERAL: Feels well no fevers, no fatigue, no changes in appetite SKIN: No itching, no rashes, no open wounds EYES: No eye pain,no redness, no discharge EARS: No earache,no ringing of ears, NOSE: No congestion, no drainage, no bleeding  MOUTH/THROAT: No mouth pain, No sore throat, No difficulty chewing or swallowing  RESPIRATORY: No cough, no wheezing, no SOB CARDIAC: No chest pain,no heart palpitations, GI: No abdominal pain, No Nausea, no vomiting, no diarrhea, no heartburn or no reflux  GU: No dysuria, no increased frequency or urgency MUSCULOSKELETAL: No unrelieved bone/joint pain,  NEUROLOGIC: Awake, alert, appropriate to situation, No change in mental status. PSYCHIATRIC: No overt anxiety or sadness.No behavior issue.      Physical Exam  GENERAL APPEARANCE: Alert, conversant. Appropriately groomed. No acute distress.  VASCULAR: Pedal pulses palpable DP and PT bilateral.  Capillary refill time is immediate to all digits,  Proximal to distal cooling it warm to warm.  Digital hair growth is present bilateral  NEUROLOGIC: sensation is intact epicritically and protectively to 5.07 monofilament at 5/5 sites bilateral.  Light touch is intact bilateral, vibratory sensation intact bilateral, achilles tendon reflex is intact bilateral.  MUSCULOSKELETAL: acceptable muscle strength, tone and stability bilateral.  Intrinsic muscluature intact bilateral.   Range of motion at ankle and first MPJ is normal bilateral. Contracture of the lesser digits bilateral. Right 2nd toe medially translated and presses on hallux   DERMATOLOGIC: skin is warm, supple, and dry.  No open lesions noted.  No interdigital maceration noted bilateral. Right hallux lateral side is mildly swollen and tender-  No drainage noted with pressure     Assessment   Ingrowing hallux nail right foot  Plan  Treatment options and alternatives were discussed. Recommended a permanent removal of the  lateral nail border of the right hallux nail. Patient agreed. Skin was prepped with alcohol and a local injection of lidocaine and Marcaine plain was infiltrated to anesthetize the toe. The toe was then prepped with Betadine exsanguinated. The offending nail border was removed and phenol applied.  It was cleansed well with alcohol. Antibiotic ointment and a dressing was then applied and the patient was given instructions for aftercare. Patient will be seen for a nail check in 1 week. And will call if any questions or concerns arise.  Marland Kitchen

## 2018-04-08 ENCOUNTER — Other Ambulatory Visit: Payer: Self-pay

## 2018-04-08 ENCOUNTER — Ambulatory Visit (INDEPENDENT_AMBULATORY_CARE_PROVIDER_SITE_OTHER): Payer: BLUE CROSS/BLUE SHIELD | Admitting: *Deleted

## 2018-04-08 DIAGNOSIS — I639 Cerebral infarction, unspecified: Secondary | ICD-10-CM | POA: Diagnosis not present

## 2018-04-10 LAB — CUP PACEART REMOTE DEVICE CHECK
Date Time Interrogation Session: 20200313013945
Implantable Pulse Generator Implant Date: 20180116

## 2018-04-15 NOTE — Progress Notes (Signed)
[facility]link Summary Report / Loop Recorder 

## 2018-05-11 ENCOUNTER — Other Ambulatory Visit: Payer: Self-pay

## 2018-05-11 ENCOUNTER — Ambulatory Visit (INDEPENDENT_AMBULATORY_CARE_PROVIDER_SITE_OTHER): Payer: BLUE CROSS/BLUE SHIELD | Admitting: *Deleted

## 2018-05-11 DIAGNOSIS — I639 Cerebral infarction, unspecified: Secondary | ICD-10-CM

## 2018-05-12 LAB — CUP PACEART REMOTE DEVICE CHECK
Date Time Interrogation Session: 20200415021131
Implantable Pulse Generator Implant Date: 20180116

## 2018-05-15 ENCOUNTER — Other Ambulatory Visit: Payer: Self-pay | Admitting: "Endocrinology

## 2018-05-18 NOTE — Progress Notes (Signed)
[facility]link Summary Report / Loop Recorder 

## 2018-06-08 ENCOUNTER — Other Ambulatory Visit: Payer: Self-pay | Admitting: "Endocrinology

## 2018-06-14 ENCOUNTER — Ambulatory Visit (INDEPENDENT_AMBULATORY_CARE_PROVIDER_SITE_OTHER): Payer: BLUE CROSS/BLUE SHIELD | Admitting: *Deleted

## 2018-06-14 ENCOUNTER — Other Ambulatory Visit: Payer: Self-pay

## 2018-06-14 DIAGNOSIS — I639 Cerebral infarction, unspecified: Secondary | ICD-10-CM

## 2018-06-14 LAB — CUP PACEART REMOTE DEVICE CHECK
Date Time Interrogation Session: 20200518024220
Implantable Pulse Generator Implant Date: 20180116

## 2018-06-22 NOTE — Progress Notes (Signed)
[facility]link Summary Report / Loop Recorder 

## 2018-06-28 ENCOUNTER — Other Ambulatory Visit: Payer: Self-pay | Admitting: "Endocrinology

## 2018-07-15 ENCOUNTER — Telehealth: Payer: Self-pay | Admitting: Cardiology

## 2018-07-15 MED ORDER — NITROGLYCERIN 0.4 MG SL SUBL: SUBLINGUAL_TABLET | SUBLINGUAL | 0 refills | Status: DC

## 2018-07-15 NOTE — Telephone Encounter (Signed)
Refill complete 

## 2018-07-15 NOTE — Telephone Encounter (Signed)
Patient needs RX for NTG sent to [facility]/tg

## 2018-07-16 ENCOUNTER — Ambulatory Visit (INDEPENDENT_AMBULATORY_CARE_PROVIDER_SITE_OTHER): Payer: BC Managed Care – PPO | Admitting: *Deleted

## 2018-07-16 DIAGNOSIS — I639 Cerebral infarction, unspecified: Secondary | ICD-10-CM | POA: Diagnosis not present

## 2018-07-17 LAB — CUP PACEART REMOTE DEVICE CHECK
Date Time Interrogation Session: 20200620033614
Implantable Pulse Generator Implant Date: 20180116

## 2018-07-21 NOTE — Progress Notes (Signed)
[facility]link Summary Report / Loop Recorder 

## 2018-07-27 ENCOUNTER — Telehealth: Payer: Self-pay | Admitting: Cardiology

## 2018-07-27 MED ORDER — CLOPIDOGREL BISULFATE 75 MG PO TABS: ORAL_TABLET | ORAL | 0 refills | Status: DC

## 2018-07-27 NOTE — Telephone Encounter (Signed)
Patient requesting 1 mth supply of Plavix sent to [facility] until his appt scheduled for 7/24. / tg

## 2018-07-27 NOTE — Telephone Encounter (Signed)
DONE

## 2018-08-04 ENCOUNTER — Telehealth: Payer: Self-pay | Admitting: "Endocrinology

## 2018-08-04 DIAGNOSIS — E1159 Type 2 diabetes mellitus with other circulatory complications: Secondary | ICD-10-CM

## 2018-08-04 MED ORDER — FREESTYLE LIBRE 14 DAY SENSOR MISC: 1.0000 "application " | 2 refills | Status: AC

## 2018-08-04 MED ORDER — BASAGLAR KWIKPEN 100 UNIT/ML ~~LOC~~ SOPN: PEN_INJECTOR | SUBCUTANEOUS | 0 refills | Status: DC

## 2018-08-04 NOTE — Telephone Encounter (Signed)
Done

## 2018-08-04 NOTE — Telephone Encounter (Signed)
Patient needs RX refill on Continuous Blood Gluc Sensor (FREESTYLE LIBRE 14 DAY SENSOR) MISC and Insulin Glargine (BASAGLAR KWIKPEN) 100 UNIT/ML SOPN. [employer]. He scheduled an appt for 8/4 and needs updated lab order for [facility]. Let me know if this cant be approved and I will call him

## 2018-08-19 ENCOUNTER — Ambulatory Visit (INDEPENDENT_AMBULATORY_CARE_PROVIDER_SITE_OTHER): Payer: BC Managed Care – PPO | Admitting: *Deleted

## 2018-08-19 DIAGNOSIS — I639 Cerebral infarction, unspecified: Secondary | ICD-10-CM

## 2018-08-19 LAB — CUP PACEART REMOTE DEVICE CHECK
Date Time Interrogation Session: 20200723075708
Implantable Pulse Generator Implant Date: 20180116

## 2018-08-20 ENCOUNTER — Encounter (INDEPENDENT_AMBULATORY_CARE_PROVIDER_SITE_OTHER): Payer: Self-pay

## 2018-08-20 ENCOUNTER — Ambulatory Visit: Payer: BC Managed Care – PPO | Admitting: Student

## 2018-08-20 ENCOUNTER — Encounter: Payer: Self-pay | Admitting: Student

## 2018-08-20 ENCOUNTER — Other Ambulatory Visit: Payer: Self-pay

## 2018-08-20 VITALS — BP 111/65 | HR 95 | Temp 98.7°F | Ht 69.0 in | Wt 159.0 lb

## 2018-08-20 DIAGNOSIS — Z8673 Personal history of transient ischemic attack (TIA), and cerebral infarction without residual deficits: Secondary | ICD-10-CM

## 2018-08-20 DIAGNOSIS — E785 Hyperlipidemia, unspecified: Secondary | ICD-10-CM

## 2018-08-20 DIAGNOSIS — I2510 Atherosclerotic heart disease of native coronary artery without angina pectoris: Secondary | ICD-10-CM | POA: Diagnosis not present

## 2018-08-20 DIAGNOSIS — Z720 Tobacco use: Secondary | ICD-10-CM

## 2018-08-20 DIAGNOSIS — E118 Type 2 diabetes mellitus with unspecified complications: Secondary | ICD-10-CM

## 2018-08-20 DIAGNOSIS — Z794 Long term (current) use of insulin: Secondary | ICD-10-CM

## 2018-08-20 MED ORDER — ATORVASTATIN CALCIUM 40 MG PO TABS: 40.0000 mg | ORAL_TABLET | Freq: Every day | ORAL | 3 refills | Status: DC

## 2018-08-20 MED ORDER — CLOPIDOGREL BISULFATE 75 MG PO TABS: ORAL_TABLET | ORAL | 3 refills | Status: DC

## 2018-08-20 NOTE — Patient Instructions (Addendum)
Medication Instructions:  Your physician recommends that you continue on your current medications as directed. Please refer to the Current Medication list given to you today.  If you need a refill on your cardiac medications before your next appointment, please call your pharmacy.   Lab work: Your physician recommends that you return for lab work in: Fasting   If you have labs (blood work) drawn today and your tests are completely normal, you will receive your results only by: Marland Kitchen MyChart Message (if you have MyChart) OR . A paper copy in the mail If you have any lab test that is abnormal or we need to change your treatment, we will call you to review the results.  Testing/Procedures: NONE   Follow-Up: At [facility], you and your health needs are our priority.  As part of our continuing mission to provide you with exceptional heart care, we have created designated Provider Care Teams.  These Care Teams include your primary Cardiologist (physician) and Advanced Practice Providers (APPs -  Physician Assistants and Nurse Practitioners) who all work together to provide you with the care you need, when you need it. You will need a follow up appointment in 6 months.  Please call our office 2 months in advance to schedule this appointment.  You may see Rozann Lesches, MD or one of the following Advanced Practice Providers on your designated Care Team:   Bernerd Pho, PA-C [facility]) . Ermalinda Barrios, PA-C ([place])  Any Other Special Instructions Will Be Listed Below (If Applicable). Thank you for choosing [facility]!     Protein Content in Foods  Generally, most healthy people need around 50 grams of protein each day. Depending on your overall health, you may need more or less protein in your diet. Talk to your health care provider or dietitian about how much protein you need. See the following list for the protein content of some common foods.  High-protein foods High-protein foods contain 4 grams (4 g) or more of protein per serving. They include:  Beef, ground sirloin (cooked) - 3 oz have 24 g of protein.  Cheese (hard) - 1 oz has 7 g of protein.  Chicken breast, boneless and skinless (cooked) - 3 oz have 13.4 g of protein.  Cottage cheese - 1/2 cup has 13.4 g of protein.  Egg - 1 egg has 6 g of protein.  Fish, filet (cooked) - 1 oz has 6-7 g of protein.  Garbanzo beans (canned or cooked) - 1/2 cup has 6-7 g of protein.  Kidney beans (canned or cooked) - 1/2 cup has 6-7 g of protein.  Lamb (cooked) - 3 oz has 24 g of protein.  Milk - 1 cup (8 oz) has 8 g of protein.  Nuts (peanuts, pistachios, almonds) - 1 oz has 6 g of protein.  Peanut butter - 1 oz has 7-8 g of protein.  Pork tenderloin (cooked) - 3 oz has 18.4 g of protein.  Pumpkin seeds - 1 oz has 8.5 g of protein.  Soybeans (roasted) - 1 oz has 8 g of protein.  Soybeans (cooked) - 1/2 cup has 11 g of protein.  Soy milk - 1 cup (8 oz) has 5-10 g of protein.  Soy or vegetable patty - 1 patty has 11 g of protein.  Sunflower seeds - 1 oz has 5.5 g of protein.  Tofu (firm) - 1/2 cup has 20 g of protein.  Tuna (canned in water) - 3 oz has 20  g of protein.  Yogurt - 6 oz has 8 g of protein. Low-protein foods Low-protein foods contain 3 grams (3 g) or less of protein per serving. They include:  Beets (raw or cooked) - 1/2 cup has 1.5 g of protein.  Bran cereal - 1/2 cup has 2-3 g of protein.  Bread - 1 slice has 2.5 g of protein.  Broccoli (raw or cooked) - 1/2 cup has 2 g of protein.  Collard greens (raw or cooked) - 1/2 cup has 2 g of protein.  Corn (fresh or cooked) - 1/2 cup has 2 g of protein.  Cream cheese - 1 oz has 2 g of protein.  Creamer (half-and-half) - 1 oz has 1 g of protein.  Flour tortilla - 1 tortilla has 2.5 g of protein  Frozen yogurt - 1/2 cup has 3 g of protein.  Fruit or vegetable juice - 1/2 cup has 1 g of protein.   Green beans (raw or cooked) - 1/2 cup has 1 g of protein.  Green peas (canned) - 1/2 cup has 3.5 g of protein.  Muffins - 1 small muffin (2 oz) has 3 g of protein.  Oatmeal (cooked) - 1/2 cup has 3 g of protein.  Potato (baked with skin) - 1 medium potato has 3 g of protein.  Rice (cooked) - 1/2 cup has 2.5-3.5 g of protein.  Sour cream - 1/2 cup has 2.5 g of protein.  Spinach (cooked) - 1/2 cup has 3 g of protein.  Squash (cooked) - 1/2 cup has 1.5 g of protein. Actual amounts of protein may be different depending on processing. Talk with your health care provider or dietitian about what foods are recommended for you. This information is not intended to replace advice given to you by your health care provider. Make sure you discuss any questions you have with your health care provider. Document Released: 04/14/2015 Document Revised: 09/24/2015 Document Reviewed: 09/24/2015 Elsevier Patient Education  2020 [employer].

## 2018-08-20 NOTE — Progress Notes (Signed)
Cardiology Office Note    Date:  08/20/2018   ID:  Nicholas [name] Aug 06, 1960, MRN [id number]  PCP:  Sharilyn Sites, MD  Cardiologist: Rozann Lesches, MD    Chief Complaint  Patient presents with   Follow-up    Overdue 18-month visit    History of Present Illness:    Nicholas [name] is a 58 y.o. male with past medical history of CAD (s/p prior stenting to PLB, RCA, LCx, and LAD with most recent intervention in 2010, low-risk NST in 06/2014), HTN, HLD, Type 2 DM prior CVA, and tobacco use who presents to the office today for overdue follow-up.   He was last examined by Dr. Domenic Polite in 06/2017 and denied any recent chest pain or dyspnea on exertion. He was continued on his current medication regimen at that time including ASA, Plavix, and statin therapy. Follow-up labs were obtained and LDL was elevated at 116 and after checking with his pharmacy he had not filled the medication since 2018, therefore he was restarted on Atrovastatin 40mg  daily with repeat FLP and LFT's recommended in 6-8 weeks.  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 Echocardiogram was performed and showed a preserved EF of 60 to 65% with no regional wall motion abnormalities. He already had an ILR in place and this showed no significant arrhythmias. Was recommended by Neurology to be on Plavix and full dose ASA.  In talking with the patient today, he reports overall doing well since his last office visit and denies any recent chest pain or dyspnea on exertion. 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 He denies any recent orthopnea, PND, lower extremity edema, or palpitations.  He does continue to smoke approximately 1 pack/day.  Past Medical History:  Diagnosis Date   Anal lesion    Anal papilla/tags - external lesion   Colon  polyp    Hyperplastic rectosigmoid polyp 9/08   Coronary atherosclerosis of native coronary artery    DES to PLB, BMS to mid and distal RCA, DES to circumflex and distal LAD, 6/10 (5 stents)   GERD (gastroesophageal reflux disease)    Helicobacter pylori (H. pylori) 09/2009   Treated with helidac   Hiatal hernia    Hyperplastic colon polyp 2008   Mixed hyperlipidemia    Myocardial infarction ([place]) 06/2008   Stroke [facility])    January 2018   Type 2 diabetes mellitus [facility])     Past Surgical History:  Procedure Laterality Date   CHOLECYSTECTOMY     COLLAPSED LUNG     15 years ago   COLONOSCOPY  10/12/2006   Dr. Gala Romney- rectosigmoid polyp s/phot snare polypectomy o/w normal rectum and terminal ileum. hyperplastic polyp on bx   EP IMPLANTABLE DEVICE N/A 02/12/2016   Procedure: Loop Recorder Insertion;  Surgeon: Thompson Grayer, MD;  Location: [place] [facility];  Service: Cardiovascular;  Laterality: N/A;   ESOPHAGOGASTRODUODENOSCOPY  10/12/2006   Dr. Gala Romney- examination of the tubular esophagus revealed no mucosal abnormalities. the EG junction was easily traversed. small hiatal hernia, the gastric mucosa o/w appeared normal. there was no infiltrating process or frank ulcer seen.   TEE WITHOUT CARDIOVERSION N/A 02/12/2016   Procedure: TRANSESOPHAGEAL ECHOCARDIOGRAM (TEE);  Surgeon: Jerline Pain, MD;  Location: [place];  Service: Cardiovascular;  Laterality: N/A;   [place]  APEX OF RIGHT LUNG     Since childhood    Current Medications: Outpatient Medications Prior to Visit  Medication Sig Dispense Refill   aspirin EC 325 MG tablet Take 1 tablet (325 mg total) by mouth daily. 30 tablet 3   BD PEN NEEDLE NANO U/F 32G X 4 MM MISC USE AS DIRECTED FOUR TIMES DAILY. 100 each 2   Continuous Blood Gluc Sensor (FREESTYLE LIBRE 14 DAY SENSOR) MISC Inject 1 application into the skin every 14 (fourteen) days. 3 each 2   insulin aspart (NOVOLOG FLEXPEN) 100 UNIT/ML FlexPen Inject  12-18 Units into the skin 3 (three) times daily with meals. 5 pen 2   Insulin Glargine (BASAGLAR KWIKPEN) 100 UNIT/ML SOPN INJECT 50 UNITS SUBCUTANEOUSLY AT BEDTIME 15 mL 0   nitroGLYCERIN (NITROSTAT) 0.4 MG SL tablet DISSOLVE 1 TABLET UNDER TONGUE EVERY 5 MINUTES UP TO 15 MIN FOR CHESTPAIN. IF NO RELIEF CALL 911. 25 tablet 0   pantoprazole (PROTONIX) 40 MG tablet Take 1 tablet by mouth daily.      traMADol (ULTRAM) 50 MG tablet Take 1 tablet (50 mg total) by mouth every 6 (six) hours as needed for moderate pain. 15 tablet 0   atorvastatin (LIPITOR) 40 MG tablet Take 1 tablet (40 mg total) by mouth daily at 6 PM. 30 tablet 3   clopidogrel (PLAVIX) 75 MG tablet TAKE (1) TABLET BY MOUTH ONCE DAILY. 60 tablet 0   No facility-administered medications prior to visit.      Allergies:   Patient has no known allergies.   Social History   Socioeconomic History   Marital status: Married    Spouse name: Not on file   Number of children: Not on file   Years of education: Not on file   Highest education level: Not on file  Occupational History   Occupation: maintenance, parking lots    Employer: SELF-EMPLOYED  Social Designer, fashion/clothing strain: Not on file   Food insecurity    Worry: Not on file    Inability: Not on file   Transportation needs    Medical: Not on file    Non-medical: Not on file  Tobacco Use   Smoking status: Current Every Day Smoker    Packs/day: 0.50    Years: 34.00    Pack years: 17.00    Types: Cigarettes    Start date: 04/08/1973   Smokeless tobacco: Never Used  Substance and Sexual Activity   Alcohol use: No    Alcohol/week: 0.0 standard drinks   Drug use: No   Sexual activity: Yes    Partners: Female    Birth control/protection: Pill  Lifestyle   Physical activity    Days per week: Not on file    Minutes per session: Not on file   Stress: Not on file  Relationships   Social connections    Talks on phone: Not on file    Gets  together: Not on file    Attends religious service: Not on file    Active member of club or organization: Not on file    Attends meetings of clubs or organizations: Not on file    Relationship status: Not on file  Other Topics Concern   Not on file  Social History Narrative   Drinks Coffee 2 cups daily.  Lives sat home with wife.   Is self employed.  Graduated from High school.       Family History:  The patient's family history includes Cancer  in his brother; Diabetes in his mother; Heart disease in his father.   Review of Systems:   Please see the history of present illness.     General:  No chills, fever, night sweats or weight changes.  Cardiovascular:  No chest pain, dyspnea on exertion, edema, orthopnea, palpitations, paroxysmal nocturnal dyspnea. Dermatological: No rash, lesions/masses Respiratory: No cough, dyspnea Urologic: No hematuria, dysuria Abdominal:   No nausea, vomiting, diarrhea, bright red blood per rectum, melena, or hematemesis Neurologic:  No visual changes, wkns, changes in mental status.  He denies any of the above symptoms.   All other systems reviewed and are otherwise negative except as noted above.   Physical Exam:    VS:  BP 111/65    Pulse 95    Temp 98.7 F (37.1 C)    Ht 5\' 9"  (1.753 m)    Wt 159 lb (72.1 kg)    BMI 23.48 kg/m    General: Well developed, well nourished,male appearing in no acute distress. Head: Normocephalic, atraumatic, sclera non-icteric, no xanthomas, nares are without discharge.  Neck: No carotid bruits. JVD not elevated.  Lungs: Respirations regular and unlabored, without wheezes or rales.  Heart: Regular rate and rhythm. No S3 or S4.  No murmur, no rubs, or gallops appreciated. Abdomen: Soft, non-tender, non-distended with normoactive bowel sounds. No hepatomegaly. No rebound/guarding. No obvious abdominal masses. Msk:  Strength and tone appear normal for age. No joint deformities or effusions. Extremities: No clubbing  or cyanosis. No lower extremity edema.  Distal pedal pulses are 2+ bilaterally. Neuro: Alert and oriented X 3. Moves all extremities spontaneously. No focal deficits noted. Psych:  Responds to questions appropriately with a normal affect. Skin: No rashes or lesions noted  Wt Readings from Last 3 Encounters:  08/20/18 159 lb (72.1 kg)  03/04/18 160 lb 0.9 oz (72.6 kg)  09/03/17 162 lb (73.5 kg)     Studies/Labs Reviewed:   EKG:  EKG is not ordered today.   Recent Labs: 03/03/2018: ALT 23; BUN 13; Creatinine, Ser 0.62; Hemoglobin 14.7; Platelets 167; Potassium 4.4; Sodium 136   Lipid Panel    Component Value Date/Time   CHOL 151 03/04/2018 0500   TRIG 81 03/04/2018 0500   HDL 32 (L) 03/04/2018 0500   CHOLHDL 4.7 03/04/2018 0500   VLDL 16 03/04/2018 0500   LDLCALC 103 (H) 03/04/2018 0500   LDLCALC 116 (H) 09/01/2017 0801    Additional studies/ records that were reviewed today include:   NST: 06/2014  There was no ST segment deviation noted during stress.  Findings consistent with prior inferolatral myocardial infarction with mild peri-infarct ischemia.  This is a low risk study.  The left ventricular ejection fraction is normal (55-65%).  Echocardiogram: 02/2018 IMPRESSIONS    1. The left ventricle has normal systolic function of 78-93%. The cavity size was normal. There is no increased left ventricular wall thickness. Echo evidence of normal diastolic relaxation.  2. No evidence of left ventricular regional wall motion abnormalities.  3. The right ventricle has normal systolic function. The cavity was normal. There is no increase in right ventricular wall thickness. Right ventricular systolic pressure could not be assessed.  4. The aortic valve is tricuspid. There is moderate calcification of the aortic valve. There is mild to moderate aortic annular calcification noted.  5. The mitral valve is normal in structure. There is mild calcification.  6. The tricuspid valve  is normal in structure.  7. The aortic root is normal in size  and structure.  Assessment:    1. Coronary artery disease involving native coronary artery of native heart without angina pectoris   2. Hyperlipidemia LDL goal <70   3. Type 2 diabetes mellitus with complication, with long-term current use of insulin ([place])   4. History of stroke   5. Tobacco abuse      Plan:   In order of problems listed above:  1. CAD - he is s/p prior stenting to PLB, RCA, LCx, and LAD with most recent intervention in 2010. His last ischemic evaluation was a low-risk NST in 06/2014. Recent echocardiogram earlier this year showed a preserved EF with no regional wall motion abnormalities as outlined above. - he denies any recent chest pain or dyspnea on exertion.  - continue ASA (on 325mg  daily per Neurology), Plavix, and statin therapy. Previously intolerant to BB therapy.  2. HLD - FLP in 02/2018 showed total cholesterol 151, triglycerides 81, HDL 32, and LDL 103. He had previously been prescribed Atorvastatin but reports he was noncompliant with the medication at that time. Since his most recent CVA, he says he has not missed a dose of the medication. Will recheck his FLP and LFT's next week. Continue Atorvastatin 40 mg daily.  3. IDDM - followed by Endocrinology. Hgb A1c elevated at 9.3 in 02/2018. Due for repeat labs next week.   4. History of CVA - followed by Neurology. ILR remains in place what has not demonstrated any significant arrhythmias thus far.   5. Tobacco Use - he continues to smoke 1 ppd. Cessation advised. He is not interested in quitting at this time.    Medication Adjustments/Labs and Tests Ordered: Current medicines are reviewed at length with the patient today.  Concerns regarding medicines are outlined above.  Medication changes, Labs and Tests ordered today are listed in the Patient Instructions below. Patient Instructions  Medication Instructions:  Your physician recommends  that you continue on your current medications as directed. Please refer to the Current Medication list given to you today.  If you need a refill on your cardiac medications before your next appointment, please call your pharmacy.   Lab work: Your physician recommends that you return for lab work in: Fasting   If you have labs (blood work) drawn today and your tests are completely normal, you will receive your results only by:  [place] (if you have MyChart) OR  A paper copy in the mail If you have any lab test that is abnormal or we need to change your treatment, we will call you to review the results.  Testing/Procedures: NONE   Follow-Up: At [facility], you and your health needs are our priority.  As part of our continuing mission to provide you with exceptional heart care, we have created designated Provider Care Teams.  These Care Teams include your primary Cardiologist (physician) and Advanced Practice Providers (APPs -  Physician Assistants and Nurse Practitioners) who all work together to provide you with the care you need, when you need it. You will need a follow up appointment in 6 months.  Please call our office 2 months in advance to schedule this appointment.  You may see Rozann Lesches, MD or one of the following Advanced Practice Providers on your designated Care Team:   Bernerd Pho, PA-C ([place])  Ermalinda Barrios, PA-C [facility])  Any Other Special Instructions Will Be Listed Below (If Applicable). Thank you for choosing [facility]!     Signed, Erma Heritage, PA-C  08/20/2018 5:12 PM    [facility] [place] [address] [place], [place] [address] Phone: [phone] Fax: [phone]

## 2018-08-30 NOTE — Progress Notes (Signed)
[facility]link Summary Report / Loop Recorder 

## 2018-08-31 ENCOUNTER — Ambulatory Visit: Payer: BLUE CROSS/BLUE SHIELD | Admitting: "Endocrinology

## 2018-09-02 LAB — LIPID PANEL
Cholesterol: 147 mg/dL (ref <200)
HDL: 29 mg/dL — ABNORMAL LOW (ref >=40)
LDL Cholesterol (Calc): 88 mg/dL (calc)
Non-HDL Cholesterol (Calc): 118 mg/dL (calc) (ref <130)
Total CHOL/HDL Ratio: 5.1 (calc) — ABNORMAL HIGH (ref <5.0)
Triglycerides: 198 mg/dL — ABNORMAL HIGH (ref <150)

## 2018-09-03 LAB — COMPLETE METABOLIC PANEL WITH GFR
AG Ratio: 1.7 (calc) (ref 1.0–2.5)
ALT: 14 U/L (ref 9–46)
AST: 12 U/L (ref 10–35)
Albumin: 4.3 g/dL (ref 3.6–5.1)
Alkaline phosphatase (APISO): 81 U/L (ref 35–144)
BUN: 18 mg/dL (ref 7–25)
CO2: 29 mmol/L (ref 20–32)
Calcium: 10 mg/dL (ref 8.6–10.3)
Chloride: 106 mmol/L (ref 98–110)
Creat: 0.79 mg/dL (ref 0.70–1.33)
GFR, Est African American: 114 mL/min/{1.73_m2} (ref >=60)
GFR, Est Non African American: 98 mL/min/{1.73_m2} (ref >=60)
Globulin: 2.6 g/dL (calc) (ref 1.9–3.7)
Glucose, Bld: 179 mg/dL — ABNORMAL HIGH (ref 65–99)
Potassium: 5.2 mmol/L (ref 3.5–5.3)
Sodium: 141 mmol/L (ref 135–146)
Total Bilirubin: 0.5 mg/dL (ref 0.2–1.2)
Total Protein: 6.9 g/dL (ref 6.1–8.1)

## 2018-09-03 LAB — HEMOGLOBIN A1C
Hgb A1c MFr Bld: 8.4 % of total Hgb — ABNORMAL HIGH (ref <5.7)
Mean Plasma Glucose: 194 (calc)
eAG (mmol/L): 10.8 (calc)

## 2018-09-06 ENCOUNTER — Telehealth: Payer: Self-pay

## 2018-09-06 DIAGNOSIS — E785 Hyperlipidemia, unspecified: Secondary | ICD-10-CM

## 2018-09-06 MED ORDER — ATORVASTATIN CALCIUM 80 MG PO TABS: 80.0000 mg | ORAL_TABLET | Freq: Every day | ORAL | 3 refills | Status: DC

## 2018-09-06 NOTE — Telephone Encounter (Signed)
-----   Message from Lonn Georgia, PA-C sent at 09/03/2018 11:24 AM EDT ----- Please let him know that his cholesterol is better than it was a year ago. However, his LDL is not at goal.  Please confirm that he is taking the Lipitor 40 mg every day.  If he is, he needs to go up to 80 mg daily. If he sometimes forgets to take it, encourage compliance and recheck in 3 months. Thanks

## 2018-09-06 NOTE — Telephone Encounter (Signed)
Pt made aware, voiced understanding. He will increase Lipitor to 80 mg daily and have lipids checked in 3 months.

## 2018-09-08 ENCOUNTER — Encounter: Payer: Self-pay | Admitting: "Endocrinology

## 2018-09-08 ENCOUNTER — Telehealth: Payer: Self-pay | Admitting: "Endocrinology

## 2018-09-08 ENCOUNTER — Other Ambulatory Visit: Payer: Self-pay

## 2018-09-08 ENCOUNTER — Ambulatory Visit (INDEPENDENT_AMBULATORY_CARE_PROVIDER_SITE_OTHER): Payer: BC Managed Care – PPO | Admitting: "Endocrinology

## 2018-09-08 VITALS — BP 123/75 | HR 98 | Ht 69.0 in | Wt 170.0 lb

## 2018-09-08 DIAGNOSIS — E782 Mixed hyperlipidemia: Secondary | ICD-10-CM

## 2018-09-08 DIAGNOSIS — E1159 Type 2 diabetes mellitus with other circulatory complications: Secondary | ICD-10-CM | POA: Diagnosis not present

## 2018-09-08 DIAGNOSIS — I10 Essential (primary) hypertension: Secondary | ICD-10-CM | POA: Diagnosis not present

## 2018-09-08 MED ORDER — FREESTYLE LIBRE 14 DAY SENSOR MISC: 1.0000 | 5 refills | Status: DC

## 2018-09-08 MED ORDER — BASAGLAR KWIKPEN 100 UNIT/ML ~~LOC~~ SOPN: PEN_INJECTOR | SUBCUTANEOUS | 0 refills | Status: DC

## 2018-09-08 NOTE — Progress Notes (Signed)
09/08/2018  Endocrinology follow-up note  Subjective:    Patient ID: Nicholas [name], male    DOB: [birth year],    Past Medical History:  Diagnosis Date  . Anal lesion    Anal papilla/tags - external lesion  . Colon polyp    Hyperplastic rectosigmoid polyp 9/08  . Coronary atherosclerosis of native coronary artery    DES to PLB, BMS to mid and distal RCA, DES to circumflex and distal LAD, 6/10 (5 stents)  . GERD (gastroesophageal reflux disease)   . Helicobacter pylori (H. pylori) 09/2009   Treated with helidac  . Hiatal hernia   . Hyperplastic colon polyp 2008  . Mixed hyperlipidemia   . Myocardial infarction ([place]) 06/2008  . Stroke [facility])    January 2018  . Type 2 diabetes mellitus ([place])    Past Surgical History:  Procedure Laterality Date  . CHOLECYSTECTOMY    . COLLAPSED LUNG     15 years ago  . COLONOSCOPY  10/12/2006   Dr. Gala Romney- rectosigmoid polyp s/phot snare polypectomy o/w normal rectum and terminal ileum. hyperplastic polyp on bx  . EP IMPLANTABLE DEVICE N/A 02/12/2016   Procedure: Loop Recorder Insertion;  Surgeon: Thompson Grayer, MD;  Location: [facility];  Service: Cardiovascular;  Laterality: N/A;  . ESOPHAGOGASTRODUODENOSCOPY  10/12/2006   Dr. Gala Romney- examination of the tubular esophagus revealed no mucosal abnormalities. the EG junction was easily traversed. small hiatal hernia, the gastric mucosa o/w appeared normal. there was no infiltrating process or frank ulcer seen.  . TEE WITHOUT CARDIOVERSION N/A 02/12/2016   Procedure: TRANSESOPHAGEAL ECHOCARDIOGRAM (TEE);  Surgeon: Jerline Pain, MD;  Location: [facility] END[facility];  Service: Cardiovascular;  Laterality: N/A;  . WIRE IN APEX OF RIGHT LUNG     Since childhood   Social History   Socioeconomic History  . Marital status: Married    Spouse name: Not on file  . Number of children: Not on file  . Years of education: Not on file  . Highest education level: Not on file  Occupational History  . Occupation:  maintenance, parking lots    Employer: SELF-EMPLOYED  Social Needs  . Financial resource strain: Not on file  . Food insecurity    Worry: Not on file    Inability: Not on file  . Transportation needs    Medical: Not on file    Non-medical: Not on file  Tobacco Use  . Smoking status: Current Every Day Smoker    Packs/day: 0.50    Years: 34.00    Pack years: 17.00    Types: Cigarettes    Start date: 04/08/1973  . Smokeless tobacco: Never Used  Substance and Sexual Activity  . Alcohol use: No    Alcohol/week: 0.0 standard drinks  . Drug use: No  . Sexual activity: Yes    Partners: Female    Birth control/protection: Pill  Lifestyle  . Physical activity    Days per week: Not on file    Minutes per session: Not on file  . Stress: Not on file  Relationships  . Social Herbalist on phone: Not on file    Gets together: Not on file    Attends religious service: Not on file    Active member of club or organization: Not on file    Attends meetings of clubs or organizations: Not on file    Relationship status: Not on file  Other Topics Concern  . Not on file  Social History Narrative  Drinks Coffee 2 cups daily.  Lives sat home with wife.   Is self employed.  Graduated from High school.     Outpatient Encounter Medications as of 09/08/2018  Medication Sig  . aspirin EC 325 MG tablet Take 1 tablet (325 mg total) by mouth daily.  Marland Kitchen atorvastatin (LIPITOR) 80 MG tablet Take 1 tablet (80 mg total) by mouth daily.  . BD PEN NEEDLE NANO U/F 32G X 4 MM MISC USE AS DIRECTED FOUR TIMES DAILY.  Marland Kitchen clopidogrel (PLAVIX) 75 MG tablet TAKE (1) TABLET BY MOUTH ONCE DAILY.  Marland Kitchen insulin aspart (NOVOLOG FLEXPEN) 100 UNIT/ML FlexPen Inject 12-18 Units into the skin 3 (three) times daily with meals.  . Insulin Glargine (BASAGLAR KWIKPEN) 100 UNIT/ML SOPN INJECT 54 UNITS SUBCUTANEOUSLY AT BEDTIME  . nitroGLYCERIN (NITROSTAT) 0.4 MG SL tablet DISSOLVE 1 TABLET UNDER TONGUE EVERY 5 MINUTES UP TO  15 MIN FOR CHESTPAIN. IF NO RELIEF CALL 911.  . pantoprazole (PROTONIX) 40 MG tablet Take 1 tablet by mouth daily.   . traMADol (ULTRAM) 50 MG tablet Take 1 tablet (50 mg total) by mouth every 6 (six) hours as needed for moderate pain.  . [DISCONTINUED] Insulin Glargine (BASAGLAR KWIKPEN) 100 UNIT/ML SOPN INJECT 50 UNITS SUBCUTANEOUSLY AT BEDTIME  . [DISCONTINUED] topiramate (TOPAMAX) 25 MG tablet Take 25 mg by mouth daily.    No facility-administered encounter medications on file as of 09/08/2018.    ALLERGIES: No Known Allergies VACCINATION STATUS:  There is no immunization history on file for this patient.  Diabetes He presents for his follow-up diabetic visit. He has type 2 diabetes mellitus. Onset time: He was diagnosed at approximate age of 51 years. In this particular patient with a history of heavy alcohol use possiblity of pancreatic diabetes is high. His disease course has been worsening. Pertinent negatives for hypoglycemia include no confusion, headaches, pallor, seizures or speech difficulty. Pertinent negatives for diabetes include no chest pain, no fatigue, no polydipsia, no polyphagia, no polyuria and no weakness. There are no hypoglycemic complications. Symptoms are worsening. Diabetic complications include a CVA and heart disease. Risk factors for coronary artery disease include diabetes mellitus, dyslipidemia, hypertension, male sex, sedentary lifestyle and tobacco exposure. Current diabetic treatment includes oral agent (dual therapy). He is compliant with treatment some of the time. His weight is increasing steadily. He is following a generally unhealthy diet. When asked about meal planning, he reported none. He has not had a previous visit with a dietitian (he did not keep appointment.). He rarely participates in exercise. His home blood glucose trend is decreasing steadily. His breakfast blood glucose range is generally 140-180 mg/dl. His lunch blood glucose range is generally  140-180 mg/dl. His dinner blood glucose range is generally 140-180 mg/dl. His bedtime blood glucose range is generally 140-180 mg/dl. His overall blood glucose range is 140-180 mg/dl. (He did not bring any logs, his previsit labs show A1c of 8.4% increasing from 7.3%.  His average blood glucose is 180 over the last 90 days.) Eye exam is current.  Hyperlipidemia This is a chronic problem. The current episode started more than 1 year ago. Exacerbating diseases include diabetes. Pertinent negatives include no chest pain, myalgias or shortness of breath. Current antihyperlipidemic treatment includes statins. Risk factors for coronary artery disease include dyslipidemia, diabetes mellitus, hypertension and a sedentary lifestyle.  Hypertension This is a chronic problem. The current episode started more than 1 year ago. The problem is controlled. Pertinent negatives include no chest pain, headaches, neck pain, palpitations  or shortness of breath. Risk factors for coronary artery disease include diabetes mellitus, dyslipidemia and smoking/tobacco exposure. Past treatments include beta blockers. Hypertensive end-organ damage includes CAD/MI and CVA.    Review of Systems  Constitutional: Negative for fatigue and unexpected weight change.  HENT: Negative for dental problem, mouth sores and trouble swallowing.   Eyes: Negative for visual disturbance.  Respiratory: Negative for cough, choking, chest tightness, shortness of breath and wheezing.   Cardiovascular: Negative for chest pain, palpitations and leg swelling.  Gastrointestinal: Negative for abdominal distention, abdominal pain, constipation, diarrhea, nausea and vomiting.  Endocrine: Negative for polydipsia, polyphagia and polyuria.  Genitourinary: Negative for dysuria, flank pain, hematuria and urgency.  Musculoskeletal: Negative for back pain, gait problem, myalgias and neck pain.  Skin: Negative for pallor, rash and wound.  Neurological: Negative for  seizures, syncope, speech difficulty, weakness, numbness and headaches.  Psychiatric/Behavioral: Negative for confusion and dysphoric mood.    Objective:    BP 123/75   Pulse 98   Ht 5\' 9"  (1.753 m)   Wt 170 lb (77.1 kg)   BMI 25.10 kg/m   Wt Readings from Last 3 Encounters:  09/08/18 170 lb (77.1 kg)  08/20/18 159 lb (72.1 kg)  03/04/18 160 lb 0.9 oz (72.6 kg)    Physical Exam  Constitutional: He is oriented to person, place, and time. He appears well-developed. He is cooperative. No distress.  HENT:  Head: Normocephalic and atraumatic.  Eyes: EOM are normal.  Neck: Normal range of motion. Neck supple. No tracheal deviation present. No thyromegaly present.  Cardiovascular: Normal rate, S1 normal and S2 normal. Exam reveals no gallop.  No murmur heard. Pulses:      Dorsalis pedis pulses are 1+ on the right side and 1+ on the left side.       Posterior tibial pulses are 1+ on the right side and 1+ on the left side.  Pulmonary/Chest: Effort normal. No respiratory distress. He has no wheezes.  Abdominal: He exhibits no distension. There is no abdominal tenderness. There is no guarding and no CVA tenderness.  Musculoskeletal:        General: No edema.     Right shoulder: He exhibits no swelling and no deformity.  Neurological: He is alert and oriented to person, place, and time. He has normal strength. No cranial nerve deficit or sensory deficit. Gait normal.  Skin: Skin is warm and dry. No rash noted. No cyanosis. Nails show no clubbing.  Psychiatric: He has a normal mood and affect. His speech is normal. Cognition and memory are normal.   passive and unconcerned affect.    Results for orders placed or performed in visit on 08/20/18  Lipid Profile  Result Value Ref Range   Cholesterol 147 <200 mg/dL   HDL 29 (L) > OR = 40 mg/dL   Triglycerides 198 (H) <150 mg/dL   LDL Cholesterol (Calc) 88 mg/dL (calc)   Total CHOL/HDL Ratio 5.1 (H) <5.0 (calc)   Non-HDL Cholesterol (Calc)  118 <130 mg/dL (calc)   Diabetic Labs (most recent): Lab Results  Component Value Date   HGBA1C 8.4 (H) 09/02/2018   HGBA1C 9.3 (H) 03/04/2018   HGBA1C 7.3 (H) 09/01/2017   Lipid Panel     Component Value Date/Time   CHOL 147 09/02/2018 0735   TRIG 198 (H) 09/02/2018 0735   HDL 29 (L) 09/02/2018 0735   CHOLHDL 5.1 (H) 09/02/2018 0735   VLDL 16 03/04/2018 0500   LDLCALC 88 09/02/2018 0735  Assessment & Plan:   1. Type 2 diabetes mellitus with vascular disease (HCC) -His diabetes is  complicated by coronary artery disease and patient remains at a high risk for more acute and chronic complications of diabetes which include CAD, CVA, CKD, retinopathy, and neuropathy. These are all discussed in detail with the patient.  -- He missed his appointment for more than a year, returns with A1c of 8.4% increasing from his last visit A1c of 7.3%.    -  He is benefiting from the [facility] device allowing him to engage better.  His average blood glucose is 180 for the last 90 days.  He did not bring his logs of insulin administration.  he has significant medical history of noncompliance/nonadherence.     - I have re-counseled the patient on diet management  by adopting a carbohydrate restricted / protein rich  Diet.  - he  admits there is a room for improvement in his diet and drink choices. -  Suggestion is made for him to avoid simple carbohydrates  from his diet including Cakes, Sweet Desserts / Pastries, Ice Cream, Soda (diet and regular), Sweet Tea, Candies, Chips, Cookies, Sweet Pastries,  Store Bought Juices, Alcohol in Excess of  1-2 drinks a day, Artificial Sweeteners, Coffee Creamer, and "Sugar-free" Products. This will help patient to have stable blood glucose profile and potentially avoid unintended weight gain.   - Patient is advised to stick to a routine mealtimes to eat 3 meals  a day and avoid unnecessary snacks ( to snack only to correct hypoglycemia).  - I have  approached patient with the following individualized plan to manage diabetes and patient agrees.  - Proper use of insulin is the  exclusive choice he has to treat his diabetes. - He advised to continue to monitor his continuous glucose monitoring device at all times.    -He is advised to increase his Basaglar to 54 units nightly, continue NovoLog  12 units 3 times daily before meals for pre-meal blood glucose above 90 mg/dL. - He will use an additional correction dose of NovoLog for blood glucose readings above 150 mg/dL. -He is encouraged to call clinic for blood glucose levels less than 70 or above 200 mg /dl. - He is not the right candidate for Invokana nor incretintherapy.  -Target numbers for A1c, LDL, HDL, Triglycerides, Waist Circumference were discussed in detail.   2) BP/HTN: His blood pressure is controlled to target.  He is advised to continue his current medications including beta blockers.  He has marginal blood pressure, will not tolerate additional ACE inhibitors.   3) Lipids/HPL: His lipid panel shows still significantly above target LDL at 116.  -He is advised to be consistent in his atorvastatin 40 mg p.o. nightly.    4)  Weight/Diet:   He is  following with  CDE consult, exercise, and carbohydrates information provided.  5) Chronic Care/Health Maintenance:  -Patient is  Statin medications and encouraged to continue to follow up with Ophthalmology, Podiatrist at least yearly or according to recommendations, and advised to  quit Smoking (tragically he resumed smoking after he quit briefly during his diagnosis of CVA ). I have recommended yearly flu vaccine and pneumonia vaccination at least every 5 years; moderate intensity exercise for up to 150 minutes weekly; and  sleep for at least 7 hours a day.  - He has a chronic heavy smoker , cannot exercise optimally.  I advised patient to maintain close follow up with his PCP for  primary care needs.  - Time spent with the  patient: 25 min, of which >50% was spent in reviewing his blood glucose logs , discussing his hypoglycemia and hyperglycemia episodes, reviewing his current and  previous labs / studies and medications  doses and developing a plan to avoid hypoglycemia and hyperglycemia. Please refer to Patient Instructions for Blood Glucose Monitoring and Insulin/Medications Dosing Guide"  in media tab for additional information. Please  also refer to " Patient Self Inventory" in the Media  tab for reviewed elements of pertinent patient history.  [name] participated in the discussions, expressed understanding, and voiced agreement with the above plans.  All questions were answered to his satisfaction. he is encouraged to contact clinic should he have any questions or concerns prior to his return visit.   Follow up plan: Return in about 4 months (around 01/08/2019) for Follow up with Pre-visit Labs, Meter, and Logs.  Glade Lloyd, MD Phone: [phone]  Fax: [phone]  This note was partially dictated with voice recognition software. Similar sounding words can be transcribed inadequately or may not  be corrected upon review.  09/08/2018, 10:53 AM

## 2018-09-08 NOTE — Telephone Encounter (Signed)
Patient said he is out of his [employer]. [facility] [place]

## 2018-09-08 NOTE — Telephone Encounter (Signed)
Rx sent 

## 2018-09-21 ENCOUNTER — Ambulatory Visit (INDEPENDENT_AMBULATORY_CARE_PROVIDER_SITE_OTHER): Payer: BC Managed Care – PPO | Admitting: *Deleted

## 2018-09-21 DIAGNOSIS — I6389 Other cerebral infarction: Secondary | ICD-10-CM | POA: Diagnosis not present

## 2018-09-21 DIAGNOSIS — I639 Cerebral infarction, unspecified: Secondary | ICD-10-CM

## 2018-09-21 LAB — CUP PACEART REMOTE DEVICE CHECK
Date Time Interrogation Session: 20200825124035
Implantable Pulse Generator Implant Date: 20180116

## 2018-09-28 NOTE — Progress Notes (Signed)
[facility]link Summary Report / Loop Recorder 

## 2018-10-11 ENCOUNTER — Telehealth: Payer: Self-pay | Admitting: Neurology

## 2018-10-11 NOTE — Telephone Encounter (Signed)
I called Nicholas [name] pts wife about her husband having headaches. I stated pt was last seen 04/21/2016 which was his only only visit. I stated per Hoyle Sauer NP note pt had complaints on headache when he was admitted to the hospital for the stroke.I also stated no headache complaints were mention by pt the time he was seen in our office. The wife explain that his headache is not daily. Her husband can have a headache once or twice a week. It just started a week ago.I advise pts wife to seek PCP and have them evaluated him. I stated his headache could be sinus, weather changes or maybe blood pressure issues. I stated to have pt schedule with a provider at his PCP office to be evaluated. I stated if the PCP determine its neurological they can send a referral over to our office to be evaluated.I also advise the wife due pt pts history of stroke if his headache is causing neurological issues to seek the nearest ED. The wife verbalized understanding. The wife ask if pt was referred to our office  can she accompany the patient. I stated [facility] allows one visitor with pt in the waiting room and exam room.Also no kids are allowed and mask is required for pt and visitor. The wife verbalized understanding.

## 2018-10-11 NOTE — Telephone Encounter (Signed)
Pt's wife called stating that he is starting to have the headaches that he was having when he last had his stroke and his wife is wanting him to be seen soon. Please advise.

## 2018-10-20 ENCOUNTER — Ambulatory Visit: Payer: BC Managed Care – PPO | Admitting: Neurology

## 2018-10-20 ENCOUNTER — Other Ambulatory Visit: Payer: Self-pay

## 2018-10-20 ENCOUNTER — Encounter: Payer: Self-pay | Admitting: Neurology

## 2018-10-20 VITALS — BP 144/82 | HR 94 | Temp 96.9°F | Ht 69.0 in | Wt 160.0 lb

## 2018-10-20 DIAGNOSIS — R202 Paresthesia of skin: Secondary | ICD-10-CM

## 2018-10-20 DIAGNOSIS — M25531 Pain in right wrist: Secondary | ICD-10-CM

## 2018-10-20 DIAGNOSIS — I639 Cerebral infarction, unspecified: Secondary | ICD-10-CM | POA: Diagnosis not present

## 2018-10-20 MED ORDER — BUPROPION HCL ER (SR) 150 MG PO TB12: 150.0000 mg | ORAL_TABLET | Freq: Every day | ORAL | 3 refills | Status: DC

## 2018-10-20 MED ORDER — PREDNISONE 5 MG PO TABS: ORAL_TABLET | ORAL | 0 refills | Status: DC

## 2018-10-20 NOTE — Progress Notes (Addendum)
Reason for visit: Cerebrovascular disease  Referring physician: Dr. Lujean Amel Nicholas [name] is a 58 y.o. male  History of present illness:  Nicholas [name] is a 58 year old right-handed white male with a history of cerebrovascular disease.  He has had 2 recent stroke events, both involving the left occipital area.  The first occurred in February 10, 2016.  The patient had onset of a headache and some visual changes, he was noted to have a left occipital stroke and there was some question of left vertebral artery origin stenosis.  The patient has diabetes and hypertension and he continues to smoke.  He had recurrence of a stroke in the left occipital area on March 03, 2018.  He had no focal symptoms but he did present with a headache.  Once again, he continues to smoke.  The patient was placed on aspirin, he has a loop recorder in place, but no evidence of atrial fibrillation has been noted.  The patient had evidence of a left posterior cerebral artery occlusion or high-grade stenosis on the recent evaluation with MRA of the head.  No evidence of vertebral artery stenosis was noted.  The patient overall has been functioning fairly well without any numbness or weakness of the face, arms, or legs with exception that he gets intermittent numbness of the right greater than left hand.  The patient may wake up with a numbness of the hands.  He does not have the best control of his diabetes, his most recent hemoglobin A1c was 8.4.  He claims that his blood sugars generally run in the 170 range.  The patient denies any numbness in the feet, he claims his balance is good, he denies issues controlling the bowels or the bladder.  He denies any focal weakness.  He recently has begun having intermittent headaches once again over the last 3 weeks.  He comes to this office for further evaluation.  He is still smoking a pack of cigarettes daily.  Yesterday, he was using a post hole digger to plant a blueberry bush, this  morning he woke up with significant pain and swelling of the right wrist.  He has no history of arthritis or gout.  Past Medical History:  Diagnosis Date   Anal lesion    Anal papilla/tags - external lesion   Colon polyp    Hyperplastic rectosigmoid polyp 9/08   Coronary atherosclerosis of native coronary artery    DES to PLB, BMS to mid and distal RCA, DES to circumflex and distal LAD, 6/10 (5 stents)   GERD (gastroesophageal reflux disease)    Helicobacter pylori (H. pylori) 09/2009   Treated with helidac   Hiatal hernia    Hyperplastic colon polyp 2008   Mixed hyperlipidemia    Myocardial infarction ([place]) 06/2008   Stroke [facility])    January 2018   Type 2 diabetes mellitus [facility])     Past Surgical History:  Procedure Laterality Date   CHOLECYSTECTOMY     COLLAPSED LUNG     15 years ago   COLONOSCOPY  10/12/2006   Dr. Gala Romney- rectosigmoid polyp s/phot snare polypectomy o/w normal rectum and terminal ileum. hyperplastic polyp on bx   EP IMPLANTABLE DEVICE N/A 02/12/2016   Procedure: Loop Recorder Insertion;  Surgeon: Thompson Grayer, MD;  Location: [facility];  Service: Cardiovascular;  Laterality: N/A;   ESOPHAGOGASTRODUODENOSCOPY  10/12/2006   Dr. Gala Romney- examination of the tubular esophagus revealed no mucosal abnormalities. the EG junction was easily traversed. small  hiatal hernia, the gastric mucosa o/w appeared normal. there was no infiltrating process or frank ulcer seen.   TEE WITHOUT CARDIOVERSION N/A 02/12/2016   Procedure: TRANSESOPHAGEAL ECHOCARDIOGRAM (TEE);  Surgeon: Jerline Pain, MD;  Location: [facility] ENDOSCOPY;  Service: Cardiovascular;  Laterality: N/A;   WIRE IN APEX OF RIGHT LUNG     Since childhood    Family History  Problem Relation Age of Onset   Diabetes Mother    Heart disease Father    Cancer Brother    CVA Neg Hx     Social history:  reports that he has been smoking cigarettes. He started smoking about 45 years ago. He has a 17.00  pack-year smoking history. He has never used smokeless tobacco. He reports that he does not drink alcohol or use drugs.  Medications:  Prior to Admission medications   Medication Sig Start Date End Date Taking? Authorizing Provider  aspirin EC 325 MG tablet Take 1 tablet (325 mg total) by mouth daily. 03/04/18 03/04/19 Yes Barton Dubois, MD  atorvastatin (LIPITOR) 80 MG tablet Take 1 tablet (80 mg total) by mouth daily. 09/06/18 12/05/18 Yes Barrett, Evelene Croon, PA-C  BD PEN NEEDLE NANO U/F 32G X 4 MM MISC USE AS DIRECTED FOUR TIMES DAILY. 08/31/17  Yes Nida, Marella Chimes, MD  clopidogrel (PLAVIX) 75 MG tablet TAKE (1) TABLET BY MOUTH ONCE DAILY. 08/20/18  Yes Strader, [place] M, PA-C  Continuous Blood Gluc Sensor (FREESTYLE LIBRE 14 DAY SENSOR) MISC 1 each by Does not apply route every 14 (fourteen) days. 09/08/18  Yes Nida, Marella Chimes, MD  insulin aspart (NOVOLOG FLEXPEN) 100 UNIT/ML FlexPen Inject 12-18 Units into the skin 3 (three) times daily with meals. 09/30/16  Yes Cassandria Anger, MD  Insulin Glargine [facility] KWIKPEN) 100 UNIT/ML SOPN INJECT 54 UNITS SUBCUTANEOUSLY AT BEDTIME 09/08/18  Yes Nida, Marella Chimes, MD  nitroGLYCERIN (NITROSTAT) 0.4 MG SL tablet DISSOLVE 1 TABLET UNDER TONGUE EVERY 5 MINUTES UP TO 15 MIN FOR CHESTPAIN. IF NO RELIEF CALL 911. 07/15/18  Yes Satira Sark, MD  pantoprazole (PROTONIX) 40 MG tablet Take 1 tablet by mouth daily.  04/11/10  Yes [provider]  traMADol (ULTRAM) 50 MG tablet Take 1 tablet (50 mg total) by mouth every 6 (six) hours as needed for moderate pain. 02/12/16  Yes Vann, Jessica U, DO  topiramate (TOPAMAX) 25 MG tablet Take 25 mg by mouth daily.  06/20/10 04/21/11  [provider]     No Known Allergies  ROS:  Out of a complete 14 system review of symptoms, the patient complains only of the following symptoms, and all other reviewed systems are negative.  Right wrist pain Hand numbness Headache  Blood pressure  (!) 144/82, pulse 94, temperature (!) 96.9 F (36.1 C), temperature source Temporal, height 5\' 9"  (1.753 m), weight 160 lb (72.6 kg).  Physical Exam  General: The patient is alert and cooperative at the time of the examination.  Eyes: Pupils are equal, round, and reactive to light. Discs are flat bilaterally.  Neck: The neck is supple, no carotid bruits are noted.  Respiratory: The respiratory examination is clear.  Cardiovascular: The cardiovascular examination reveals a regular rate and rhythm, no obvious murmurs or rubs are noted.  Skin: Extremities are without significant edema.  Swelling of the right wrist is noted with pain with movement across the wrist, particular with wrist extension.  Neurologic Exam  Mental status: The patient is alert and oriented x 3 at the time of  the examination. The patient has apparent normal recent and remote memory, with an apparently normal attention span and concentration ability.  Cranial nerves: Facial symmetry is present. There is good sensation of the face to pinprick and soft touch bilaterally. The strength of the facial muscles and the muscles to head turning and shoulder shrug are normal bilaterally. Speech is well enunciated, no aphasia or dysarthria is noted. Extraocular movements are full. Visual fields are full. The tongue is midline, and the patient has symmetric elevation of the soft palate. No obvious hearing deficits are noted.  Motor: The motor testing reveals 5 over 5 strength of all 4 extremities. Good symmetric motor tone is noted throughout.  Sensory: Sensory testing is intact to pinprick, soft touch, vibration sensation, and position sense on all 4 extremities. No evidence of extinction is noted.  Coordination: Cerebellar testing reveals good finger-nose-finger and heel-to-shin bilaterally.  Tinel sign at the wrists are positive bilaterally.  Gait and station: Gait is normal. Tandem gait is normal. Romberg is negative. No drift  is seen.  Reflexes: Deep tendon reflexes are symmetric and normal bilaterally. Toes are downgoing bilaterally.   Assessment/Plan:  1.  History of recurring left occipital stroke  2.  Diabetes  3.  Hypertension  4.  Ongoing tobacco abuse  5.  Bilateral hand numbness  6.  Recent onset of right wrist swelling and pain  The patient was placed on a 6-day prednisone Dosepak for the right wrist pain, if this continues an x-ray of the wrist and further evaluation may be required.  He will have blood work done today.  I have urged him to quit smoking, he will be placed on Wellbutrin and he may use nicotine gum if needed.  He has had a loop recorder that has been placed, no evidence of atrial fibrillation has been noted.  He will follow-up here in 4 months.  He will have nerve conduction studies on both arms and EMG on the right arm.  The patient may have carpal tunnel syndrome.  Nicholas Alexanders MD 10/20/2018 9:30 AM  [facility] [address] [place], [place] [address]  Phone [phone] Fax [phone]

## 2018-10-21 LAB — COMPREHENSIVE METABOLIC PANEL
ALT: 19 IU/L (ref 0–44)
AST: 15 IU/L (ref 0–40)
Albumin/Globulin Ratio: 1.9 (ref 1.2–2.2)
Albumin: 4.4 g/dL (ref 3.8–4.9)
Alkaline Phosphatase: 86 IU/L (ref 39–117)
BUN/Creatinine Ratio: 17 (ref 9–20)
BUN: 13 mg/dL (ref 6–24)
Bilirubin Total: 0.3 mg/dL (ref 0.0–1.2)
CO2: 22 mmol/L (ref 20–29)
Calcium: 9.4 mg/dL (ref 8.7–10.2)
Chloride: 104 mmol/L (ref 96–106)
Creatinine, Ser: 0.77 mg/dL (ref 0.76–1.27)
GFR calc Af Amer: 115 mL/min/{1.73_m2} (ref >59)
GFR calc non Af Amer: 99 mL/min/{1.73_m2} (ref >59)
Globulin, Total: 2.3 g/dL (ref 1.5–4.5)
Glucose: 168 mg/dL — ABNORMAL HIGH (ref 65–99)
Potassium: 4.4 mmol/L (ref 3.5–5.2)
Sodium: 140 mmol/L (ref 134–144)
Total Protein: 6.7 g/dL (ref 6.0–8.5)

## 2018-10-21 LAB — RHEUMATOID FACTOR: Rheumatoid fact SerPl-aCnc: <10 IU/mL (ref 0.0–13.9)

## 2018-10-21 LAB — SEDIMENTATION RATE: Sed Rate: 25 mm/hr (ref 0–30)

## 2018-10-21 LAB — URIC ACID: Uric Acid: 5 mg/dL (ref 3.7–8.6)

## 2018-10-21 LAB — B. BURGDORFI ANTIBODIES: Lyme IgG/IgM Ab: <0.91 {ISR} (ref 0.00–0.90)

## 2018-10-21 LAB — ANA W/REFLEX: Anti Nuclear Antibody (ANA): NEGATIVE

## 2018-10-25 ENCOUNTER — Ambulatory Visit (INDEPENDENT_AMBULATORY_CARE_PROVIDER_SITE_OTHER): Payer: BC Managed Care – PPO | Admitting: *Deleted

## 2018-10-25 ENCOUNTER — Telehealth: Payer: Self-pay

## 2018-10-25 DIAGNOSIS — I639 Cerebral infarction, unspecified: Secondary | ICD-10-CM | POA: Diagnosis not present

## 2018-10-25 NOTE — Telephone Encounter (Signed)
I reached out to the pt and advised of labs results. Pt verbalized understanding.

## 2018-10-25 NOTE — Telephone Encounter (Signed)
-----   Message from Kathrynn Ducking, MD sent at 10/21/2018  5:08 PM EDT -----  The blood work results are unremarkable. Please call the patient. ----- Message ----- From: Lavone Neri Lab Results In Sent: 10/21/2018   7:38 AM EDT To: Kathrynn Ducking, MD

## 2018-10-26 LAB — CUP PACEART REMOTE DEVICE CHECK
Date Time Interrogation Session: 20200929084011
Implantable Pulse Generator Implant Date: 20180116

## 2018-11-03 NOTE — Progress Notes (Signed)
[facility]link Summary Report / Loop Recorder 

## 2018-11-17 ENCOUNTER — Telehealth: Payer: Self-pay | Admitting: Neurology

## 2018-11-17 NOTE — Telephone Encounter (Signed)
Pt has called to confirm his NCV/EMG, pt also wants to know if Dr Jannifer Franklin would want to do the test on his right leg since he is having the same problem with his right leg as he is with his right arm.  Please call

## 2018-11-17 NOTE — Telephone Encounter (Signed)
I called the patient.  The patient within the last 2 to 3 days began having right hip pain and some pain into the calf muscle on the right.  He denies any back pain.  When we do the nerve conduction study, we will add nerve conductions to the right leg, I can do EMG on the right arm and right leg.

## 2018-11-22 ENCOUNTER — Other Ambulatory Visit: Payer: Self-pay | Admitting: "Endocrinology

## 2018-11-22 DIAGNOSIS — E1159 Type 2 diabetes mellitus with other circulatory complications: Secondary | ICD-10-CM

## 2018-11-24 ENCOUNTER — Other Ambulatory Visit: Payer: Self-pay

## 2018-11-24 ENCOUNTER — Ambulatory Visit (INDEPENDENT_AMBULATORY_CARE_PROVIDER_SITE_OTHER): Payer: BC Managed Care – PPO | Admitting: Neurology

## 2018-11-24 ENCOUNTER — Encounter: Payer: Self-pay | Admitting: Neurology

## 2018-11-24 ENCOUNTER — Institutional Professional Consult (permissible substitution): Payer: BC Managed Care – PPO | Admitting: Neurology

## 2018-11-24 ENCOUNTER — Ambulatory Visit: Payer: BC Managed Care – PPO | Admitting: Neurology

## 2018-11-24 DIAGNOSIS — G5603 Carpal tunnel syndrome, bilateral upper limbs: Secondary | ICD-10-CM

## 2018-11-24 DIAGNOSIS — R202 Paresthesia of skin: Secondary | ICD-10-CM | POA: Diagnosis not present

## 2018-11-24 DIAGNOSIS — E1142 Type 2 diabetes mellitus with diabetic polyneuropathy: Secondary | ICD-10-CM

## 2018-11-24 HISTORY — DX: Carpal tunnel syndrome, bilateral upper limbs: G56.03

## 2018-11-24 HISTORY — DX: Type 2 diabetes mellitus with diabetic polyneuropathy: E11.42

## 2018-11-24 MED ORDER — PREDNISONE 5 MG PO TABS: ORAL_TABLET | ORAL | 0 refills | Status: DC

## 2018-11-24 NOTE — Progress Notes (Addendum)
The patient comes in for EMG nerve conduction study.  He has bilateral carpal tunnel syndrome associated with a generalized diabetic peripheral neuropathy.  He reports right hip pain and pain down the right leg, EMG of the right leg does not show evidence of an overlying lumbosacral radiculopathy but the pain onset was on 10 days ago.  We will give him a prednisone Dosepak, he will contact me if the pain continues.      [place]    Nerve / Sites Muscle Latency Ref. Amplitude Ref. Rel Amp Segments Distance Velocity Ref. Area    ms ms mV mV %  cm m/s m/s mVms  L Median - APB     Wrist APB 4.2 ?4.4 7.0 ?4.0 100 Wrist - APB 7   25.2     Upper arm APB 8.8  6.5  93 Upper arm - Wrist 23 50 ?49 24.8  R Median - APB     Wrist APB 5.3 ?4.4 7.0 ?4.0 100 Wrist - APB 7   22.9     Upper arm APB 10.0  6.9  98.4 Upper arm - Wrist 23 49 ?49 22.8  L Ulnar - ADM     Wrist ADM 3.3 ?3.3 8.0 ?6.0 100 Wrist - ADM 7   22.1     B.Elbow ADM 7.3  7.0  87 B.Elbow - Wrist 22 55 ?49 21.3     A.Elbow ADM 9.3  6.7  95.2 A.Elbow - B.Elbow 10 51 ?49 21.6         A.Elbow - Wrist      R Ulnar - ADM     Wrist ADM 3.3 ?3.3 8.4 ?6.0 100 Wrist - ADM 7   25.7     B.Elbow ADM 7.7  7.9  94.9 B.Elbow - Wrist 22 50 ?49 24.6     A.Elbow ADM 9.7  7.4  93.4 A.Elbow - B.Elbow 10 49 ?49 23.5         A.Elbow - Wrist      R Peroneal - EDB     Ankle EDB NR ?6.5 NR ?2.0 NR Ankle - EDB 9   NR     Fib head EDB NR  NR  NR Fib head - Ankle 30 NR ?44 NR     Pop fossa EDB NR  NR  NR Pop fossa - Fib head 10 NR ?44 NR         Pop fossa - Ankle      L Peroneal - EDB     Ankle EDB NR ?6.5 NR ?2.0 NR Ankle - EDB 9   NR     Fib head EDB NR  NR  NR Fib head - Ankle 30 NR ?44 NR     Pop fossa EDB NR  NR  NR Pop fossa - Fib head 10 NR ?44 NR         Pop fossa - Ankle      R Tibial - AH     Ankle AH 6.0 ?5.8 2.0 ?4.0 100 Ankle - AH 9   6.1     Pop fossa AH 17.6  1.7  86.6 Pop fossa - Ankle 40 35 ?41 6.9  L Tibial - AH     Ankle AH 6.0 ?5.8 4.7 ?4.0  100 Ankle - AH 9   13.1     Pop fossa AH 16.7  2.7  57.6 Pop fossa - Ankle 40 38 ?41 12.8  [place]    Nerve / Sites Rec. Site Peak Lat Ref.  Amp Ref. Segments Distance    ms ms V V  cm  R Sural - Ankle (Calf)     Calf Ankle 4.0 ?4.4 3 ?6 Calf - Ankle 14  L Sural - Ankle (Calf)     Calf Ankle 3.5 ?4.4 7 ?6 Calf - Ankle 14  R Superficial peroneal - Ankle     Lat leg Ankle 4.3 ?4.4 6 ?6 Lat leg - Ankle 14  L Superficial peroneal - Ankle     Lat leg Ankle 4.4 ?4.4 8 ?6 Lat leg - Ankle 14  L Median - Orthodromic (Dig II, Mid palm)     Dig II Wrist 4.1 ?3.4 4 ?10 Dig II - Wrist 13  R Median - Orthodromic (Dig II, Mid palm)     Dig II Wrist NR ?3.4 NR ?10 Dig II - Wrist 13  L Ulnar - Orthodromic, (Dig V, Mid palm)     Dig V Wrist 3.1 ?3.1 5 ?5 Dig V - Wrist 11  R Ulnar - Orthodromic, (Dig V, Mid palm)     Dig V Wrist 3.1 ?3.1 5 ?5 Dig V - Wrist 10                     F  Wave    Nerve F Lat Ref.   ms ms  L Ulnar - ADM 25.6 ?32.0  R Ulnar - ADM 32.4 ?32.0  R Tibial - AH 60.1 ?56.0  L Tibial - AH 59.8 ?56.0

## 2018-11-24 NOTE — Progress Notes (Signed)
Please refer to EMG and nerve conduction procedure note.  

## 2018-11-24 NOTE — Procedures (Signed)
     HISTORY:  Nicholas [name] is a 58 year old gentleman with a history of diabetes who reports numbness in both hands.  He recently began noting onset of right hip pain and pain down into the right leg below the knee.  The pain seems to be worse with lying down.  He is being evaluated for this issue.  NERVE CONDUCTION STUDIES:  Nerve conduction studies were performed on both upper extremities.  The distal motor latencies for the median nerves were prolonged bilaterally with normal motor amplitudes bilaterally.  The distal motor latencies and motor amplitudes for the ulnar nerves were normal bilaterally.  The nerve conduction velocities for the median and ulnar nerves were normal bilaterally.  The F-wave latencies for the ulnar nerves were normal on the left and slightly prolonged on the right.  Nerve conduction studies were performed on both lower extremities.  The distal motor latencies and motor amplitudes were unobtainable for the peroneal nerves on both sides, the distal motor latencies for the posterior tibial nerves were prolonged bilaterally with low motor amplitudes on the right and normal on the left.  Slowing was seen for the posterior tibial nerves bilaterally.  The sensory latencies for the sural nerves were normal bilaterally and the peroneal sensory latencies were at the upper limits of normal bilaterally.  The F-wave latencies for the posterior tibial nerves were prolonged bilaterally.  EMG STUDIES:  EMG study was performed on the right lower extremity:  The tibialis anterior muscle reveals 2 to 4K motor units with slightly decreased recruitment. No fibrillations or positive waves were seen. The peroneus tertius muscle reveals 2 to 4K motor units with slightly decreased recruitment. No fibrillations or positive waves were seen. The medial gastrocnemius muscle reveals 1 to 3K motor units with slightly decreased recruitment. No fibrillations or positive waves were seen. The vastus  lateralis muscle reveals 2 to 4K motor units with full recruitment. No fibrillations or positive waves were seen. The iliopsoas muscle reveals 2 to 4K motor units with full recruitment. No fibrillations or positive waves were seen. The biceps femoris muscle (long head) reveals 2 to 4K motor units with full recruitment. No fibrillations or positive waves were seen. The lumbosacral paraspinal muscles were tested at 3 levels, and revealed no abnormalities of insertional activity at all 3 levels tested. There was good relaxation.   IMPRESSION:  Nerve conduction studies done on all 4 extremities shows evidence of a generalized peripheral neuropathy likely secondary to diabetes.  There is evidence of mild bilateral carpal tunnel syndrome.  EMG evaluation of the right lower extremity shows minimal chronic neuropathic denervation below the knee that is consistent with the diagnosis of peripheral neuropathy but no clear evidence of an overlying lumbosacral radiculopathy.  Jill Alexanders MD 11/24/2018 1:55 PM  [facility] [address] [place], [place] [address]  Phone [phone] Fax [phone]

## 2018-11-28 LAB — CUP PACEART REMOTE DEVICE CHECK
Date Time Interrogation Session: 20201101121718
Implantable Pulse Generator Implant Date: 20180116

## 2018-11-29 ENCOUNTER — Ambulatory Visit (INDEPENDENT_AMBULATORY_CARE_PROVIDER_SITE_OTHER): Payer: BC Managed Care – PPO | Admitting: *Deleted

## 2018-11-29 DIAGNOSIS — I639 Cerebral infarction, unspecified: Secondary | ICD-10-CM | POA: Diagnosis not present

## 2018-12-08 ENCOUNTER — Encounter (HOSPITAL_COMMUNITY): Payer: Self-pay | Admitting: *Deleted

## 2018-12-08 ENCOUNTER — Other Ambulatory Visit (HOSPITAL_COMMUNITY): Payer: Self-pay | Admitting: *Deleted

## 2018-12-08 DIAGNOSIS — Z87891 Personal history of nicotine dependence: Secondary | ICD-10-CM

## 2018-12-08 DIAGNOSIS — Z122 Encounter for screening for malignant neoplasm of respiratory organs: Secondary | ICD-10-CM

## 2018-12-08 NOTE — Progress Notes (Signed)
Received call from patient for self-referral for initial lung cancer screening scan.  I obtained smoking history (started age 58, current smoker, 129 pack year) as well as answering questions related to the screening process.  Patient denies signs/symptoms of lung cancer such as weight loss or hemoptysis.  Patient denies comorbidity that would prevent curative treatment if lung cancer were to be found.  Patient is scheduled for shared decision making visit and CT scan on 11/24 at 830am.

## 2018-12-21 ENCOUNTER — Ambulatory Visit (HOSPITAL_COMMUNITY): Payer: BC Managed Care – PPO | Admitting: Hematology

## 2018-12-21 ENCOUNTER — Ambulatory Visit (HOSPITAL_COMMUNITY): Payer: BC Managed Care – PPO

## 2018-12-21 NOTE — Progress Notes (Signed)
[facility]link Summary Report / Loop Recorder 

## 2018-12-22 ENCOUNTER — Encounter (HOSPITAL_COMMUNITY): Payer: Self-pay | Admitting: *Deleted

## 2018-12-27 ENCOUNTER — Other Ambulatory Visit: Payer: Self-pay

## 2018-12-28 ENCOUNTER — Inpatient Hospital Stay (HOSPITAL_COMMUNITY): Payer: BC Managed Care – PPO | Attending: Hematology | Admitting: Hematology

## 2018-12-28 ENCOUNTER — Other Ambulatory Visit (HOSPITAL_COMMUNITY): Payer: Self-pay | Admitting: *Deleted

## 2018-12-28 ENCOUNTER — Ambulatory Visit (HOSPITAL_COMMUNITY)
Admission: RE | Admit: 2018-12-28 | Discharge: 2018-12-28 | Disposition: A | Discharge status: Home / Self Care | Payer: BC Managed Care – PPO | Source: Ambulatory Visit | Attending: Hematology | Admitting: Hematology

## 2018-12-28 DIAGNOSIS — J439 Emphysema, unspecified: Secondary | ICD-10-CM | POA: Insufficient documentation

## 2018-12-28 DIAGNOSIS — E782 Mixed hyperlipidemia: Secondary | ICD-10-CM | POA: Diagnosis not present

## 2018-12-28 DIAGNOSIS — I2510 Atherosclerotic heart disease of native coronary artery without angina pectoris: Secondary | ICD-10-CM | POA: Insufficient documentation

## 2018-12-28 DIAGNOSIS — Z8249 Family history of ischemic heart disease and other diseases of the circulatory system: Secondary | ICD-10-CM | POA: Diagnosis not present

## 2018-12-28 DIAGNOSIS — Z8673 Personal history of transient ischemic attack (TIA), and cerebral infarction without residual deficits: Secondary | ICD-10-CM | POA: Insufficient documentation

## 2018-12-28 DIAGNOSIS — Z79899 Other long term (current) drug therapy: Secondary | ICD-10-CM | POA: Insufficient documentation

## 2018-12-28 DIAGNOSIS — K219 Gastro-esophageal reflux disease without esophagitis: Secondary | ICD-10-CM | POA: Diagnosis not present

## 2018-12-28 DIAGNOSIS — Z122 Encounter for screening for malignant neoplasm of respiratory organs: Secondary | ICD-10-CM

## 2018-12-28 DIAGNOSIS — Z7982 Long term (current) use of aspirin: Secondary | ICD-10-CM | POA: Insufficient documentation

## 2018-12-28 DIAGNOSIS — F17210 Nicotine dependence, cigarettes, uncomplicated: Secondary | ICD-10-CM | POA: Diagnosis not present

## 2018-12-28 DIAGNOSIS — E1142 Type 2 diabetes mellitus with diabetic polyneuropathy: Secondary | ICD-10-CM | POA: Insufficient documentation

## 2018-12-28 DIAGNOSIS — Z7952 Long term (current) use of systemic steroids: Secondary | ICD-10-CM | POA: Diagnosis not present

## 2018-12-28 DIAGNOSIS — Z833 Family history of diabetes mellitus: Secondary | ICD-10-CM | POA: Insufficient documentation

## 2018-12-28 DIAGNOSIS — C3490 Malignant neoplasm of unspecified part of unspecified bronchus or lung: Secondary | ICD-10-CM | POA: Insufficient documentation

## 2018-12-28 DIAGNOSIS — Z794 Long term (current) use of insulin: Secondary | ICD-10-CM | POA: Diagnosis not present

## 2018-12-28 DIAGNOSIS — Z87891 Personal history of nicotine dependence: Secondary | ICD-10-CM | POA: Diagnosis not present

## 2018-12-28 DIAGNOSIS — Z720 Tobacco use: Secondary | ICD-10-CM

## 2018-12-28 DIAGNOSIS — I252 Old myocardial infarction: Secondary | ICD-10-CM | POA: Diagnosis not present

## 2018-12-28 NOTE — Patient Instructions (Signed)
You were seen today for your shared decision making visit and a low-dose CT scan for lung cancer screening.    

## 2018-12-28 NOTE — Progress Notes (Signed)
[facility] Initial Visit:  Patient Care Team: Sharilyn Sites, MD as PCP - General (Family Medicine) Satira Sark, MD as PCP - Cardiology (Cardiology) Gala Romney [name], MD (Gastroenterology)  CHIEF COMPLAINTS/PURPOSE OF CONSULTATION: -Shared decision making visit for lung cancer screening   HISTORY OF PRESENTING ILLNESS: Nicholas [name] 58 y.o. male presents today for shared decision making visit for lung cancer screening.  He has a past medical history significant for GERD, hyperlipidemia diabetes, stroke, MI, coronary artery disease.  Patient states he started smoking at the age of 58.  He has smoked 3 packs of cigarettes a day for the last 43 years resulting in a 129-year pack history.  She reports occasional cough without sputum production.  He reports shortness of breath on exertion that does resolve with rest.  He denies alcohol or illicit drug use.  He has a family history significant for bone cancer in his brother.  Review of Systems  Constitutional: Negative.   HENT:  Negative.   Eyes: Negative.   Respiratory: Positive for cough.   Cardiovascular: Negative.   Gastrointestinal: Negative.   Endocrine: Negative.   Genitourinary: Negative.    Musculoskeletal: Positive for arthralgias and myalgias.  Skin: Negative.   Neurological: Negative.   Hematological: Negative.   Psychiatric/Behavioral: Negative.     MEDICAL HISTORY: Past Medical History:  Diagnosis Date  . Anal lesion    Anal papilla/tags - external lesion  . Bilateral carpal tunnel syndrome 11/24/2018  . Colon polyp    Hyperplastic rectosigmoid polyp 9/08  . Coronary atherosclerosis of native coronary artery    DES to PLB, BMS to mid and distal RCA, DES to circumflex and distal LAD, 6/10 (5 stents)  . Diabetic peripheral neuropathy ([place]) 11/24/2018  . GERD (gastroesophageal reflux disease)   . Helicobacter pylori (H. pylori) 09/2009   Treated with helidac  . Hiatal hernia   .  Hyperplastic colon polyp 2008  . Mixed hyperlipidemia   . Myocardial infarction ([place]) 06/2008  . Stroke [facility])    January 2018  . Type 2 diabetes mellitus ([place])     SURGICAL HISTORY: Past Surgical History:  Procedure Laterality Date  . CHOLECYSTECTOMY    . COLLAPSED LUNG     15 years ago  . COLONOSCOPY  10/12/2006   Dr. Gala Romney- rectosigmoid polyp s/phot snare polypectomy o/w normal rectum and terminal ileum. hyperplastic polyp on bx  . EP IMPLANTABLE DEVICE N/A 02/12/2016   Procedure: Loop Recorder Insertion;  Surgeon: Thompson Grayer, MD;  Location: [facility];  Service: Cardiovascular;  Laterality: N/A;  . ESOPHAGOGASTRODUODENOSCOPY  10/12/2006   Dr. Gala Romney- examination of the tubular esophagus revealed no mucosal abnormalities. the EG junction was easily traversed. small hiatal hernia, the gastric mucosa o/w appeared normal. there was no infiltrating process or frank ulcer seen.  . TEE WITHOUT CARDIOVERSION N/A 02/12/2016   Procedure: TRANSESOPHAGEAL ECHOCARDIOGRAM (TEE);  Surgeon: Jerline Pain, MD;  Location: [facility] ENDOSCOPY;  Service: Cardiovascular;  Laterality: N/A;  . WIRE IN APEX OF RIGHT LUNG     Since childhood    SOCIAL HISTORY: Social History   Socioeconomic History  . Marital status: Married    Spouse name: Not on file  . Number of children: Not on file  . Years of education: Not on file  . Highest education level: Not on file  Occupational History  . Occupation: maintenance, parking lots    Employer: SELF-EMPLOYED  Social Needs  . Financial resource strain: Not on file  .  Food insecurity    Worry: Not on file    Inability: Not on file  . Transportation needs    Medical: Not on file    Non-medical: Not on file  Tobacco Use  . Smoking status: Current Every Day Smoker    Packs/day: 0.50    Years: 34.00    Pack years: 17.00    Types: Cigarettes    Start date: 04/08/1973  . Smokeless tobacco: Never Used  Substance and Sexual Activity  . Alcohol use: No     Alcohol/week: 0.0 standard drinks  . Drug use: No  . Sexual activity: Yes    Partners: Female    Birth control/protection: Pill  Lifestyle  . Physical activity    Days per week: Not on file    Minutes per session: Not on file  . Stress: Not on file  Relationships  . Social Herbalist on phone: Not on file    Gets together: Not on file    Attends religious service: Not on file    Active member of club or organization: Not on file    Attends meetings of clubs or organizations: Not on file    Relationship status: Not on file  . Intimate partner violence    Fear of current or ex partner: Not on file    Emotionally abused: Not on file    Physically abused: Not on file    Forced sexual activity: Not on file  Other Topics Concern  . Not on file  Social History Narrative   Drinks Coffee 2 cups daily.  Lives sat home with wife.   Is self employed.  Graduated from High school.      FAMILY HISTORY Family History  Problem Relation Age of Onset  . Diabetes Mother   . Heart disease Father   . Cancer Brother   . CVA Neg Hx     ALLERGIES:  has No Known Allergies.  MEDICATIONS:  Current Outpatient Medications  Medication Sig Dispense Refill  . aspirin EC 325 MG tablet Take 1 tablet (325 mg total) by mouth daily. 30 tablet 3  . atorvastatin (LIPITOR) 80 MG tablet Take 1 tablet (80 mg total) by mouth daily. 90 tablet 3  . BD PEN NEEDLE NANO U/F 32G X 4 MM MISC USE AS DIRECTED FOUR TIMES DAILY. 100 each 2  . buPROPion (WELLBUTRIN SR) 150 MG 12 hr tablet Take 1 tablet (150 mg total) by mouth daily. 30 tablet 3  . clopidogrel (PLAVIX) 75 MG tablet TAKE (1) TABLET BY MOUTH ONCE DAILY. 90 tablet 3  . Continuous Blood Gluc Sensor (FREESTYLE LIBRE 14 DAY SENSOR) MISC 1 each by Does not apply route every 14 (fourteen) days. 2 each 5  . insulin aspart (NOVOLOG FLEXPEN) 100 UNIT/ML FlexPen Inject 12-18 Units into the skin 3 (three) times daily with meals. 5 pen 2  . Insulin Glargine  (BASAGLAR KWIKPEN) 100 UNIT/ML SOPN INJECT 54 UNITS SUBCUTANEOUSLY AT BEDTIME 15 mL 2  . nitroGLYCERIN (NITROSTAT) 0.4 MG SL tablet DISSOLVE 1 TABLET UNDER TONGUE EVERY 5 MINUTES UP TO 15 MIN FOR CHESTPAIN. IF NO RELIEF CALL 911. 25 tablet 0  . pantoprazole (PROTONIX) 40 MG tablet Take 1 tablet by mouth daily.     . predniSONE (DELTASONE) 5 MG tablet Begin taking 6 tablets daily, taper by one tablet daily until off the medication. 21 tablet 0  . traMADol (ULTRAM) 50 MG tablet Take 1 tablet (50 mg total) by mouth every 6 (  six) hours as needed for moderate pain. 15 tablet 0   No current facility-administered medications for this visit.     PHYSICAL EXAMINATION:  ECOG PERFORMANCE STATUS: 1 - Symptomatic but completely ambulatory   There were no vitals filed for this visit.  There were no vitals filed for this visit.   Physical Exam Constitutional:      Appearance: Normal appearance.  HENT:     Head: Normocephalic.     Right Ear: External ear normal.     Left Ear: External ear normal.     Nose: Nose normal.  Neck:     Musculoskeletal: Normal range of motion.  Cardiovascular:     Rate and Rhythm: Normal rate and regular rhythm.     Pulses: Normal pulses.     Heart sounds: Normal heart sounds.  Pulmonary:     Comments: Diminished breath sounds all 4 lobes. Abdominal:     General: Bowel sounds are normal.  Musculoskeletal: Normal range of motion.  Skin:    General: Skin is warm.  Neurological:     General: No focal deficit present.     Mental Status: He is alert and oriented to person, place, and time.      LABORATORY DATA: I have personally reviewed the data as listed:  Clinical Support on 11/29/2018  Component Date Value Ref Range Status  . Date Time Interrogation Session 11/28/2018 [id number]   Final  . Pulse Generator Manufacturer 11/28/2018 [facility]   Final  . Pulse Gen Model 11/28/2018 [id number] Reveal LINQ   Final  . Pulse Gen Serial Number 11/28/2018 [id number]    Final  . Clinic Name 11/28/2018 [facility]   Final  . Implantable Pulse Generator Type 11/28/2018 [facility]   Final  . Implantable Pulse Generator Implan* 11/28/2018 [id number]   Final    RADIOGRAPHIC STUDIES: I have personally reviewed the radiological images as listed and agree with the findings in the report  Ct Chest Lung Ca Screen Low Dose W/o Cm  Result Date: 12/28/2018 CLINICAL DATA:  One hundred twenty-nine pack-year smoking history. Current smoker. EXAM: CT CHEST WITHOUT CONTRAST LOW-DOSE FOR LUNG CANCER SCREENING TECHNIQUE: Multidetector CT imaging of the chest was performed following the standard protocol without IV contrast. COMPARISON:  05/16/2009 chest CT.  No prior screening CT. FINDINGS: Cardiovascular: Aortic and branch vessel atherosclerosis. Normal heart size, without pericardial effusion. Multivessel coronary artery atherosclerosis. Mediastinum/Nodes: No mediastinal or definite hilar adenopathy, given limitations of unenhanced CT. Lungs/Pleura: No pleural fluid. Right lower lobe scarring. Mild centrilobular and paraseptal emphysema. Right-sided pulmonary nodules, maximally volume derived equivalent diameter 7.7 mm in the central right lower lobe, including on 193/4 and coronal image 166. Upper Abdomen: Cholecystectomy. Normal imaged portions of the Liver, spleen, stomach, pancreas, adrenal glands, kidneys. Abdominal aortic atherosclerosis. Musculoskeletal: Loop recorder.  No acute osseous abnormality. IMPRESSION: 1. Lung-RADS 3, probably benign findings. Short-term follow-up in 6 months is recommended with repeat low-dose chest CT without contrast (please use the following order, "CT CHEST LCS NODULE FOLLOW-UP W/O CM"). Right lower lobe pulmonary nodule of volume derived equivalent diameter 7.7 mm. 2. Aortic atherosclerosis (ICD10-I70.0), coronary artery atherosclerosis and emphysema (ICD10-J43.9). Electronically Signed   By: [name] Miyamoto M.D.   On: 12/28/2018 11:48     ASSESSMENT/PLAN  ASSESSMENT & PLAN:  1. Tobacco abuse -This patient meets the criteriafor low dose CT lung cancer screening. She isasymptomatic for any signs or symptoms of lung cancer. -The Shared Decision-Making Visit discussion included risks and benefits of screening, potential for  follow up,diagnostic testing for abnormal scans, potential for false positive tests, over diagnosis, and discussion about total radiation exposure. -Patient stated willingness to undergo diagnostics and treatment as needed. -Patientwascounseled on smoking cessation to decreaserisk of lung cancer, pulmonary disease, heart disease and stroke. -Patientwas given a resource card with information on receiving free nicotine replacement therapy , and information about free smoking cessation classes. - Patient will present for  LDCT scan today and follow up with PCP.    All questions were answered. The patient knows to call the clinic with any problems, questions or concerns.  This note was electronically signed.    Roger Shelter, FNP  12/28/2018 1:53 PM

## 2018-12-28 NOTE — Progress Notes (Signed)
Patient notified via telephone of LDCT lung cancer screening results with recommendations to follow up in 6 months.  Per Reynolds Bowl, NP we will follow up in 4 months.  Patient was notified of incidental findings and need to follow up with PCP.  Patient was self referred to this program but a copy of results were mailed to his primary care physician.  Patient is scheduled for his follow up CT scan and is aware of appointment.

## 2018-12-29 ENCOUNTER — Other Ambulatory Visit (HOSPITAL_COMMUNITY): Payer: Self-pay | Admitting: *Deleted

## 2018-12-29 DIAGNOSIS — Z87891 Personal history of nicotine dependence: Secondary | ICD-10-CM

## 2018-12-31 ENCOUNTER — Ambulatory Visit (INDEPENDENT_AMBULATORY_CARE_PROVIDER_SITE_OTHER): Payer: BC Managed Care – PPO | Admitting: *Deleted

## 2018-12-31 DIAGNOSIS — I639 Cerebral infarction, unspecified: Secondary | ICD-10-CM

## 2019-01-01 LAB — CUP PACEART REMOTE DEVICE CHECK
Date Time Interrogation Session: 20201204095642
Implantable Pulse Generator Implant Date: 20180116

## 2019-01-10 ENCOUNTER — Ambulatory Visit: Payer: BC Managed Care – PPO | Admitting: "Endocrinology

## 2019-02-02 ENCOUNTER — Ambulatory Visit (INDEPENDENT_AMBULATORY_CARE_PROVIDER_SITE_OTHER): Payer: BC Managed Care – PPO | Admitting: *Deleted

## 2019-02-02 DIAGNOSIS — I639 Cerebral infarction, unspecified: Secondary | ICD-10-CM | POA: Diagnosis not present

## 2019-02-03 LAB — CUP PACEART REMOTE DEVICE CHECK
Date Time Interrogation Session: 20210106100018
Date Time Interrogation Session: 20210107150513
Implantable Pulse Generator Implant Date: 20180116
Implantable Pulse Generator Implant Date: 20180116

## 2019-02-21 ENCOUNTER — Ambulatory Visit: Payer: BC Managed Care – PPO | Admitting: Family Medicine

## 2019-03-07 ENCOUNTER — Ambulatory Visit (INDEPENDENT_AMBULATORY_CARE_PROVIDER_SITE_OTHER): Payer: BC Managed Care – PPO | Admitting: *Deleted

## 2019-03-07 DIAGNOSIS — I639 Cerebral infarction, unspecified: Secondary | ICD-10-CM | POA: Diagnosis not present

## 2019-03-07 LAB — CUP PACEART REMOTE DEVICE CHECK
Date Time Interrogation Session: 20210207231451
Implantable Pulse Generator Implant Date: 20180116

## 2019-03-08 NOTE — Progress Notes (Signed)
ILR Remote 

## 2019-04-06 LAB — CUP PACEART REMOTE DEVICE CHECK
Date Time Interrogation Session: 20210310235216
Implantable Pulse Generator Implant Date: 20180116

## 2019-04-07 ENCOUNTER — Ambulatory Visit (INDEPENDENT_AMBULATORY_CARE_PROVIDER_SITE_OTHER): Payer: BC Managed Care – PPO | Admitting: *Deleted

## 2019-04-07 DIAGNOSIS — I639 Cerebral infarction, unspecified: Secondary | ICD-10-CM

## 2019-04-07 NOTE — Progress Notes (Signed)
ILR Remote 

## 2019-04-14 ENCOUNTER — Other Ambulatory Visit: Payer: Self-pay | Admitting: "Endocrinology

## 2019-05-02 ENCOUNTER — Other Ambulatory Visit: Payer: Self-pay | Admitting: "Endocrinology

## 2019-05-02 ENCOUNTER — Ambulatory Visit (HOSPITAL_COMMUNITY)
Admission: RE | Admit: 2019-05-02 | Discharge: 2019-05-02 | Disposition: A | Discharge status: Home / Self Care | Payer: BC Managed Care – PPO | Source: Ambulatory Visit | Attending: Hematology | Admitting: Hematology

## 2019-05-02 ENCOUNTER — Other Ambulatory Visit: Payer: Self-pay

## 2019-05-02 ENCOUNTER — Encounter (HOSPITAL_COMMUNITY): Payer: Self-pay | Admitting: *Deleted

## 2019-05-02 DIAGNOSIS — Z87891 Personal history of nicotine dependence: Secondary | ICD-10-CM | POA: Insufficient documentation

## 2019-05-02 DIAGNOSIS — E1159 Type 2 diabetes mellitus with other circulatory complications: Secondary | ICD-10-CM

## 2019-05-02 DIAGNOSIS — Z79899 Other long term (current) drug therapy: Secondary | ICD-10-CM

## 2019-05-02 DIAGNOSIS — R5383 Other fatigue: Secondary | ICD-10-CM

## 2019-05-02 DIAGNOSIS — E559 Vitamin D deficiency, unspecified: Secondary | ICD-10-CM

## 2019-05-02 NOTE — Progress Notes (Signed)
Patient notified via telephone of LDCT lung cancer screening results with recommendations to follow up in 12 months.  Also notified of incidental findings and need to follow up with PCP.  Patient's referring provider was sent a copy of results.    IMPRESSION: 1. Lung-RADS 2, benign appearance or behavior. Continue annual screening with low-dose chest CT without contrast in 12 months. 2. Aortic atherosclerosis (ICD10-I70.0). Coronary artery calcification. 3.  Emphysema (ICD10-J43.9).

## 2019-05-03 LAB — COMPREHENSIVE METABOLIC PANEL
AG Ratio: 1.8 (calc) (ref 1.0–2.5)
ALT: 14 U/L (ref 9–46)
AST: 13 U/L (ref 10–35)
Albumin: 4.4 g/dL (ref 3.6–5.1)
Alkaline phosphatase (APISO): 73 U/L (ref 35–144)
BUN: 17 mg/dL (ref 7–25)
CO2: 28 mmol/L (ref 20–32)
Calcium: 9.6 mg/dL (ref 8.6–10.3)
Chloride: 105 mmol/L (ref 98–110)
Creat: 0.78 mg/dL (ref 0.70–1.25)
Globulin: 2.4 g/dL (calc) (ref 1.9–3.7)
Glucose, Bld: 215 mg/dL — ABNORMAL HIGH (ref 65–99)
Potassium: 4.8 mmol/L (ref 3.5–5.3)
Sodium: 140 mmol/L (ref 135–146)
Total Bilirubin: 0.4 mg/dL (ref 0.2–1.2)
Total Protein: 6.8 g/dL (ref 6.1–8.1)

## 2019-05-03 LAB — T4, FREE: Free T4: 1 ng/dL (ref 0.8–1.8)

## 2019-05-03 LAB — HEMOGLOBIN A1C
Hgb A1c MFr Bld: 8.5 % of total Hgb — ABNORMAL HIGH (ref <5.7)
Mean Plasma Glucose: 197 (calc)
eAG (mmol/L): 10.9 (calc)

## 2019-05-03 LAB — TSH: TSH: 2.04 mIU/L (ref 0.40–4.50)

## 2019-05-03 LAB — VITAMIN D 25 HYDROXY (VIT D DEFICIENCY, FRACTURES): Vit D, 25-Hydroxy: 15 ng/mL — ABNORMAL LOW (ref 30–100)

## 2019-05-05 ENCOUNTER — Other Ambulatory Visit: Payer: Self-pay | Admitting: "Endocrinology

## 2019-05-08 LAB — CUP PACEART REMOTE DEVICE CHECK
Date Time Interrogation Session: 20210411024846
Implantable Pulse Generator Implant Date: 20180116

## 2019-05-09 ENCOUNTER — Ambulatory Visit (INDEPENDENT_AMBULATORY_CARE_PROVIDER_SITE_OTHER): Payer: BC Managed Care – PPO | Admitting: *Deleted

## 2019-05-09 DIAGNOSIS — I639 Cerebral infarction, unspecified: Secondary | ICD-10-CM | POA: Diagnosis not present

## 2019-05-09 NOTE — Progress Notes (Signed)
ILR Remote 

## 2019-05-10 ENCOUNTER — Other Ambulatory Visit: Payer: Self-pay

## 2019-05-10 ENCOUNTER — Encounter: Payer: Self-pay | Admitting: "Endocrinology

## 2019-05-10 ENCOUNTER — Ambulatory Visit: Payer: BC Managed Care – PPO | Admitting: "Endocrinology

## 2019-05-10 VITALS — BP 117/69 | HR 96 | Ht 69.0 in | Wt 164.8 lb

## 2019-05-10 DIAGNOSIS — I10 Essential (primary) hypertension: Secondary | ICD-10-CM | POA: Diagnosis not present

## 2019-05-10 DIAGNOSIS — E559 Vitamin D deficiency, unspecified: Secondary | ICD-10-CM | POA: Insufficient documentation

## 2019-05-10 DIAGNOSIS — E782 Mixed hyperlipidemia: Secondary | ICD-10-CM | POA: Diagnosis not present

## 2019-05-10 DIAGNOSIS — E1159 Type 2 diabetes mellitus with other circulatory complications: Secondary | ICD-10-CM | POA: Diagnosis not present

## 2019-05-10 MED ORDER — BASAGLAR KWIKPEN 100 UNIT/ML ~~LOC~~ SOPN: 60.0000 [IU] | PEN_INJECTOR | Freq: Every day | SUBCUTANEOUS | 2 refills | Status: DC

## 2019-05-10 MED ORDER — VITAMIN D (ERGOCALCIFEROL) 1.25 MG (50000 UNIT) PO CAPS: 50000.0000 [IU] | ORAL_CAPSULE | ORAL | 0 refills | Status: DC

## 2019-05-10 NOTE — Progress Notes (Signed)
05/10/2019  Endocrinology follow-up note  Subjective:    Patient ID: Nicholas [name], male    DOB: 1960-02-08,    Past Medical History:  Diagnosis Date  . Anal lesion    Anal papilla/tags - external lesion  . Bilateral carpal tunnel syndrome 11/24/2018  . Colon polyp    Hyperplastic rectosigmoid polyp 9/08  . Coronary atherosclerosis of native coronary artery    DES to PLB, BMS to mid and distal RCA, DES to circumflex and distal LAD, 6/10 (5 stents)  . Diabetic peripheral neuropathy ([place]) 11/24/2018  . GERD (gastroesophageal reflux disease)   . Helicobacter pylori (H. pylori) 09/2009   Treated with helidac  . Hiatal hernia   . Hyperplastic colon polyp 2008  . Mixed hyperlipidemia   . Myocardial infarction ([place]) 06/2008  . Stroke [facility])    January 2018  . Type 2 diabetes mellitus ([place])    Past Surgical History:  Procedure Laterality Date  . CHOLECYSTECTOMY    . COLLAPSED LUNG     15 years ago  . COLONOSCOPY  10/12/2006   Dr. Gala Romney- rectosigmoid polyp s/phot snare polypectomy o/w normal rectum and terminal ileum. hyperplastic polyp on bx  . EP IMPLANTABLE DEVICE N/A 02/12/2016   Procedure: Loop Recorder Insertion;  Surgeon: Thompson Grayer, MD;  Location: [place] [facility];  Service: Cardiovascular;  Laterality: N/A;  . ESOPHAGOGASTRODUODENOSCOPY  10/12/2006   Dr. Gala Romney- examination of the tubular esophagus revealed no mucosal abnormalities. the EG junction was easily traversed. small hiatal hernia, the gastric mucosa o/w appeared normal. there was no infiltrating process or frank ulcer seen.  . TEE WITHOUT CARDIOVERSION N/A 02/12/2016   Procedure: TRANSESOPHAGEAL ECHOCARDIOGRAM (TEE);  Surgeon: Jerline Pain, MD;  Location: [facility] ENDOSCOPY;  Service: Cardiovascular;  Laterality: N/A;  . WIRE IN APEX OF RIGHT LUNG     Since childhood   Social History   Socioeconomic History  . Marital status: Married    Spouse name: Not on file  . Number of children: Not on file  . Years of  education: Not on file  . Highest education level: Not on file  Occupational History  . Occupation: maintenance, parking lots    Employer: SELF-EMPLOYED  Tobacco Use  . Smoking status: Current Every Day Smoker    Packs/day: 0.50    Years: 34.00    Pack years: 17.00    Types: Cigarettes    Start date: 04/08/1973  . Smokeless tobacco: Never Used  Substance and Sexual Activity  . Alcohol use: No    Alcohol/week: 0.0 standard drinks  . Drug use: No  . Sexual activity: Yes    Partners: Female    Birth control/protection: Pill  Other Topics Concern  . Not on file  Social History Narrative   Drinks Coffee 2 cups daily.  Lives sat home with wife.   Is self employed.  Graduated from High school.     Social Determinants of Health   Financial Resource Strain:   . Difficulty of Paying Living Expenses:   Food Insecurity:   . Worried About Charity fundraiser in the Last Year:   . Arboriculturist in the Last Year:   Transportation Needs:   . Film/video editor (Medical):   Marland Kitchen Lack of Transportation (Non-Medical):   Physical Activity:   . Days of Exercise per Week:   . Minutes of Exercise per Session:   Stress:   . Feeling of Stress :   Social Connections:   .  Frequency of Communication with Friends and Family:   . Frequency of Social Gatherings with Friends and Family:   . Attends Religious Services:   . Active Member of Clubs or Organizations:   . Attends Archivist Meetings:   Marland Kitchen Marital Status:    Outpatient Encounter Medications as of 05/10/2019  Medication Sig  . atorvastatin (LIPITOR) 80 MG tablet Take 1 tablet (80 mg total) by mouth daily.  . BD PEN NEEDLE NANO U/F 32G X 4 MM MISC USE AS DIRECTED FOUR TIMES DAILY.  Marland Kitchen buPROPion (WELLBUTRIN SR) 150 MG 12 hr tablet Take 1 tablet (150 mg total) by mouth daily.  . clopidogrel (PLAVIX) 75 MG tablet TAKE (1) TABLET BY MOUTH ONCE DAILY.  Marland Kitchen Continuous Blood Gluc Sensor (FREESTYLE LIBRE 14 DAY SENSOR) MISC APPLY 1 PATCH  EVERY 14 DAYS  . gabapentin (NEURONTIN) 100 MG capsule Take 100 mg by mouth 3 (three) times daily.  . insulin aspart (NOVOLOG FLEXPEN) 100 UNIT/ML FlexPen Inject 12-18 Units into the skin 3 (three) times daily with meals.  . Insulin Glargine (BASAGLAR KWIKPEN) 100 UNIT/ML Inject 0.6 mLs (60 Units total) into the skin at bedtime.  . nitroGLYCERIN (NITROSTAT) 0.4 MG SL tablet DISSOLVE 1 TABLET UNDER TONGUE EVERY 5 MINUTES UP TO 15 MIN FOR CHESTPAIN. IF NO RELIEF CALL 911.  . pantoprazole (PROTONIX) 40 MG tablet Take 1 tablet by mouth daily.   . predniSONE (DELTASONE) 5 MG tablet Begin taking 6 tablets daily, taper by one tablet daily until off the medication.  . traMADol (ULTRAM) 50 MG tablet Take 1 tablet (50 mg total) by mouth every 6 (six) hours as needed for moderate pain.  . Vitamin D, Ergocalciferol, (DRISDOL) 1.25 MG (50000 UNIT) CAPS capsule Take 1 capsule (50,000 Units total) by mouth every 7 (seven) days.  . [DISCONTINUED] Insulin Glargine (BASAGLAR KWIKPEN) 100 UNIT/ML SOPN INJECT 54 UNITS SUBCUTANEOUSLY AT BEDTIME   No facility-administered encounter medications on file as of 05/10/2019.   ALLERGIES: No Known Allergies VACCINATION STATUS:  There is no immunization history on file for this patient.  Diabetes He presents for his follow-up diabetic visit. He has type 2 diabetes mellitus. Onset time: He was diagnosed at approximate age of 73 years. In this particular patient with a history of heavy alcohol use possiblity of pancreatic diabetes is high. His disease course has been worsening. Pertinent negatives for hypoglycemia include no confusion, headaches, pallor, seizures or speech difficulty. Pertinent negatives for diabetes include no chest pain, no fatigue, no polydipsia, no polyphagia, no polyuria and no weakness. There are no hypoglycemic complications. Symptoms are worsening. Diabetic complications include a CVA and heart disease. Risk factors for coronary artery disease include  diabetes mellitus, dyslipidemia, hypertension, male sex, sedentary lifestyle and tobacco exposure. Current diabetic treatment includes oral agent (dual therapy). He is compliant with treatment some of the time. His weight is increasing steadily. He is following a generally unhealthy diet. When asked about meal planning, he reported none. He has not had a previous visit with a dietitian (he did not keep appointment.). He rarely participates in exercise. His home blood glucose trend is decreasing steadily. His breakfast blood glucose range is generally 140-180 mg/dl. His lunch blood glucose range is generally 140-180 mg/dl. His dinner blood glucose range is generally 140-180 mg/dl. His bedtime blood glucose range is generally 140-180 mg/dl. His overall blood glucose range is 140-180 mg/dl. (He did not bring any logs, his previsit labs show A1c of 8.4% increasing from 7.3%.  His average  blood glucose is 180 over the last 90 days.) Eye exam is current.  Hyperlipidemia This is a chronic problem. The current episode started more than 1 year ago. Exacerbating diseases include diabetes. Pertinent negatives include no chest pain, myalgias or shortness of breath. Current antihyperlipidemic treatment includes statins. Risk factors for coronary artery disease include dyslipidemia, diabetes mellitus, hypertension and a sedentary lifestyle.  Hypertension This is a chronic problem. The current episode started more than 1 year ago. The problem is controlled. Pertinent negatives include no chest pain, headaches, neck pain, palpitations or shortness of breath. Risk factors for coronary artery disease include diabetes mellitus, dyslipidemia and smoking/tobacco exposure. Past treatments include beta blockers. Hypertensive end-organ damage includes CAD/MI and CVA.    Review of Systems  Constitutional: Negative for fatigue and unexpected weight change.  HENT: Negative for dental problem, mouth sores and trouble swallowing.    Eyes: Negative for visual disturbance.  Respiratory: Negative for cough, choking, chest tightness, shortness of breath and wheezing.   Cardiovascular: Negative for chest pain, palpitations and leg swelling.  Gastrointestinal: Negative for abdominal distention, abdominal pain, constipation, diarrhea, nausea and vomiting.  Endocrine: Negative for polydipsia, polyphagia and polyuria.  Genitourinary: Negative for dysuria, flank pain, hematuria and urgency.  Musculoskeletal: Negative for back pain, gait problem, myalgias and neck pain.  Skin: Negative for pallor, rash and wound.  Neurological: Negative for seizures, syncope, speech difficulty, weakness, numbness and headaches.  Psychiatric/Behavioral: Negative for confusion and dysphoric mood.    Objective:    BP 117/69   Pulse 96   Ht 5\' 9"  (1.753 m)   Wt 164 lb 12.8 oz (74.8 kg)   BMI 24.34 kg/m   Wt Readings from Last 3 Encounters:  05/10/19 164 lb 12.8 oz (74.8 kg)  10/20/18 160 lb (72.6 kg)  09/08/18 170 lb (77.1 kg)    Physical Exam  Constitutional: He is oriented to person, place, and time. He appears well-developed. He is cooperative. No distress.  HENT:  Head: Normocephalic and atraumatic.  Eyes: EOM are normal.  Neck: No tracheal deviation present. No thyromegaly present.  Cardiovascular: Normal rate, S1 normal and S2 normal. Exam reveals no gallop.  No murmur heard. Pulses:      Dorsalis pedis pulses are 1+ on the right side and 1+ on the left side.       Posterior tibial pulses are 1+ on the right side and 1+ on the left side.  Pulmonary/Chest: Effort normal. No respiratory distress. He has no wheezes.  Abdominal: He exhibits no distension. There is no abdominal tenderness. There is no guarding and no CVA tenderness.  Musculoskeletal:        General: No edema.     Right shoulder: No swelling or deformity.     Cervical back: Normal range of motion and neck supple.  Neurological: He is alert and oriented to person,  place, and time. He has normal strength. No cranial nerve deficit or sensory deficit. Gait normal.  Skin: Skin is warm and dry. No rash noted. No cyanosis. Nails show no clubbing.  Psychiatric: He has a normal mood and affect. His speech is normal. Cognition and memory are normal.   passive and unconcerned affect.    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 Type 2 diabetes mellitus with vascular disease (HCC) -His diabetes is  complicated by coronary artery disease and patient remains at a high risk for more acute and chronic complications of diabetes which include CAD, CVA, CKD, retinopathy, and neuropathy. These are all discussed in detail with the patient.  --He missed his appointment since August 2020.  Returns with significantly above target glycemic profile and A1c of 8.5% increasing from 7.3%.  His CGM analysis shows time range at 55%, 45% above range.  No hypoglycemia.    -  He is benefiting from the [facility] device allowing him to engage better.  However, he is not documenting his insulin administration records.   he has significant medical history of noncompliance/nonadherence.     - I have re-counseled  the patient on diet management  by adopting a carbohydrate restricted / protein rich  Diet.  - he  admits there is a room for improvement in his diet and drink choices. -  Suggestion is made for him to avoid simple carbohydrates  from his diet including Cakes, Sweet Desserts / Pastries, Ice Cream, Soda (diet and regular), Sweet Tea, Candies, Chips, Cookies, Sweet Pastries,  Store Bought Juices, Alcohol in Excess of  1-2 drinks a day, Artificial Sweeteners, Coffee Creamer, and "Sugar-free" Products. This will help patient to have stable blood glucose profile and potentially avoid unintended weight gain.   - Patient is advised to stick to a routine mealtimes to eat 3 meals  a day and avoid unnecessary snacks ( to snack only to correct hypoglycemia).  - I have approached patient with the following individualized plan to manage diabetes and patient agrees.  - Proper use of insulin is the  exclusive choice he has to treat his diabetes. - He advised to continue to monitor his continuous glucose monitoring device at all times.    -He is advised to increase Basaglar to 60 units nightly,  continue NovoLog  12 units 3 times daily before meals for pre-meal blood glucose above 90 mg/dL. - He will use an additional correction dose of NovoLog for blood glucose readings above 150 mg/dL. -He is encouraged to call clinic for blood glucose levels less than 70 or above 200 mg /dl. - He is not the right candidate for Invokana nor incretintherapy.  -Target numbers for A1c, LDL, HDL, Triglycerides, Waist Circumference were discussed in detail.   2) BP/HTN: His blood pressure is controlled to target.  He is advised to continue his current medications including beta blockers.  He has marginal blood pressure, will not tolerate additional ACE inhibitors.   3) Lipids/HPL: His lipid panel shows still significantly above target LDL at 116.  -He is advised to be consistent in his atorvastatin 40 mg p.o. nightly.    4)   Weight/Diet:   His BMI  is 24.  He is not a candidate for weight loss.  He is  following with  CDE consult, exercise, and carbohydrates information provided.   5) vitamin D deficiency: I discussed initiated vitamin D2 50,000 units weekly for 12 weeks.  6) Chronic Care/Health Maintenance:  -Patient is  Statin medications and encouraged to continue to follow up with Ophthalmology, Podiatrist at least yearly or according to recommendations, and advised to  quit Smoking (tragically he resumed smoking after he quit briefly during his diagnosis of CVA ). I have recommended yearly flu vaccine and pneumonia vaccination at least every 5 years; moderate intensity exercise for up to 150 minutes weekly; and  sleep for at least 7 hours a day.  The patient was counseled on the dangers of tobacco use, and was advised to quit.  Reviewed strategies to maximize success, including removing cigarettes and smoking materials from environment.   I advised patient to maintain close follow up with his PCP for primary care needs.  - Time spent on this patient care encounter:  35 min, of which > 50% was spent in  counseling and the rest reviewing his blood glucose logs , discussing his hypoglycemia and hyperglycemia episodes, reviewing his current and  previous labs / studies  ( including abstraction from other facilities) and medications  doses and developing a  long term treatment plan and documenting his care.   Please refer to Patient Instructions for Blood Glucose Monitoring and Insulin/Medications Dosing Guide"  in media tab for additional information. Please  also refer to " Patient Self Inventory" in the Media  tab for reviewed elements of pertinent patient history.  [name] participated in the discussions, expressed understanding, and voiced agreement with the above plans.  All questions were answered to his satisfaction. he is encouraged to contact clinic should he have any questions or concerns prior to his  return visit.   Follow up plan: Return in about 3 months (around 08/09/2019) for Bring Meter and Logs- A1c in Office.  Glade Lloyd, MD Phone: [phone]  Fax: [phone]  This note was partially dictated with voice recognition software. Similar sounding words can be transcribed inadequately or may not  be corrected upon review.  05/10/2019, 8:33 AM

## 2019-05-10 NOTE — Patient Instructions (Signed)

## 2019-05-17 ENCOUNTER — Other Ambulatory Visit: Payer: Self-pay

## 2019-05-17 MED ORDER — NITROGLYCERIN 0.4 MG SL SUBL: SUBLINGUAL_TABLET | SUBLINGUAL | 3 refills | Status: DC

## 2019-05-17 NOTE — Telephone Encounter (Signed)
Refilled NTG 

## 2019-05-18 ENCOUNTER — Encounter: Payer: Self-pay | Admitting: Internal Medicine

## 2019-06-06 ENCOUNTER — Telehealth: Payer: Self-pay | Admitting: *Deleted

## 2019-06-06 NOTE — Telephone Encounter (Signed)
Nicholas [name], you are scheduled for a virtual visit with your provider today.  Just as we do with appointments in the office, we must obtain your consent to participate.  Your consent will be active for this visit and any virtual visit you may have with one of our providers in the next 365 days.  If you have a MyChart account, I can also send a copy of this consent to you electronically.  All virtual visits are billed to your insurance company just like a traditional visit in the office.  As this is a virtual visit, video technology does not allow for your provider to perform a traditional examination.  This may limit your provider's ability to fully assess your condition.  If your provider identifies any concerns that need to be evaluated in person or the need to arrange testing such as labs, EKG, etc, we will make arrangements to do so.  Although advances in technology are sophisticated, we cannot ensure that it will always work on either your end or our end.  If the connection with a video visit is poor, we may have to switch to a telephone visit.  With either a video or telephone visit, we are not always able to ensure that we have a secure connection.   I need to obtain your verbal consent now.   Are you willing to proceed with your visit today?

## 2019-06-06 NOTE — Telephone Encounter (Signed)
Pt consented to a virtual visit. 

## 2019-06-09 LAB — CUP PACEART REMOTE DEVICE CHECK
Date Time Interrogation Session: 20210512025310
Implantable Pulse Generator Implant Date: 20180116

## 2019-06-13 ENCOUNTER — Ambulatory Visit (INDEPENDENT_AMBULATORY_CARE_PROVIDER_SITE_OTHER): Payer: BC Managed Care – PPO | Admitting: *Deleted

## 2019-06-13 DIAGNOSIS — I639 Cerebral infarction, unspecified: Secondary | ICD-10-CM | POA: Diagnosis not present

## 2019-06-13 NOTE — Progress Notes (Signed)
[facility]link Summary Report / Loop Recorder 

## 2019-06-16 NOTE — Progress Notes (Signed)
Primary Care Physician:  Sharilyn Sites, MD Primary GI:  Garfield Cornea, MD   Patient Location: Home  Provider Location: [facility] office  Reason for Visit:  Chief Complaint  Patient presents with  . Colonoscopy    Persons present on the virtual encounter, with roles: Patient, myself (provider),Angela Stallings, CMA(updated meds and allergies)  Total time (minutes) spent on medical discussion: 10 minutes  Due to COVID-19, visit was conducted using Mychart video method.  Visit was requested by patient.  Virtual Visit via Mychart video  I connected with Nicholas [name] on 06/17/19 at  8:30 AM EDT by Mychart video and verified that I am speaking with the correct person using two identifiers.   I discussed the limitations, risks, security and privacy concerns of performing an evaluation and management service by telephone/video and the availability of in person appointments. I also discussed with the patient that there may be a patient responsible charge related to this service. The patient expressed understanding and agreed to proceed.   HPI:   Nicholas [name] is a 59 y.o. male who presents for virtual visit for consideration of a colonoscopy.  He was referred by Sharilyn Sites with [facility].  Patient had a negative screening colonoscopy in 2008.  Patient was brought in for an office visit due to polypharmacy and need to discuss sedation options.  His conscious sedation requirements as far as dosage of medications were significant back in 2008, he is now on daily tramadol, Neurontin, and has been on antidepressants but none currently.  A GI standpoint he is doing well.  BM regular. No melena, brbpr. No abdominal pain. No heartburn on pantoprazole.  No unintentional weight loss.  No dysphagia.  He has had prior upper endoscopy without evidence of Barrett's in the past.   Current Outpatient Medications  Medication Sig Dispense Refill  . atorvastatin (LIPITOR) 80 MG tablet  Take 1 tablet (80 mg total) by mouth daily. 90 tablet 3  . BD PEN NEEDLE NANO U/F 32G X 4 MM MISC USE AS DIRECTED FOUR TIMES DAILY. 100 each 2  . clopidogrel (PLAVIX) 75 MG tablet TAKE (1) TABLET BY MOUTH ONCE DAILY. 90 tablet 3  . Coenzyme Q10 (COQ10) 100 MG CAPS Take by mouth daily.    . Continuous Blood Gluc Sensor (FREESTYLE LIBRE 14 DAY SENSOR) MISC APPLY 1 PATCH EVERY 14 DAYS 2 each 0  . gabapentin (NEURONTIN) 100 MG capsule Take 100 mg by mouth 3 (three) times daily.    . insulin aspart (NOVOLOG FLEXPEN) 100 UNIT/ML FlexPen Inject 12-18 Units into the skin 3 (three) times daily with meals. 5 pen 2  . Insulin Glargine (BASAGLAR KWIKPEN) 100 UNIT/ML Inject 0.6 mLs (60 Units total) into the skin at bedtime. (Patient taking differently: Inject 50 Units into the skin at bedtime. ) 15 mL 2  . nitroGLYCERIN (NITROSTAT) 0.4 MG SL tablet DISSOLVE 1 TABLET UNDER TONGUE EVERY 5 MINUTES UP TO 15 MIN FOR CHESTPAIN. IF NO RELIEF CALL 911. 25 tablet 3  . pantoprazole (PROTONIX) 40 MG tablet Take 1 tablet by mouth daily.     . traMADol (ULTRAM) 50 MG tablet Take 1 tablet (50 mg total) by mouth every 6 (six) hours as needed for moderate pain. (Patient taking differently: Take 50 mg by mouth daily. ) 15 tablet 0  . Vitamin D, Ergocalciferol, (DRISDOL) 1.25 MG (50000 UNIT) CAPS capsule Take 1 capsule (50,000 Units total) by mouth every 7 (seven) days. 12 capsule 0  No current facility-administered medications for this visit.    Past Medical History:  Diagnosis Date  . Anal lesion    Anal papilla/tags - external lesion  . Bilateral carpal tunnel syndrome 11/24/2018  . Colon polyp    Hyperplastic rectosigmoid polyp 9/08  . Coronary atherosclerosis of native coronary artery    DES to PLB, BMS to mid and distal RCA, DES to circumflex and distal LAD, 6/10 (5 stents)  . Diabetic peripheral neuropathy ([place]) 11/24/2018  . GERD (gastroesophageal reflux disease)   . Helicobacter pylori (H. pylori) 09/2009    Treated with helidac  . Hiatal hernia   . Hyperplastic colon polyp 2008  . Mixed hyperlipidemia   . Myocardial infarction ([place]) 06/2008  . Stroke [facility])    January 2018  . Type 2 diabetes mellitus ([place])     Past Surgical History:  Procedure Laterality Date  . CHOLECYSTECTOMY    . COLLAPSED LUNG     15 years ago  . COLONOSCOPY  10/12/2006   Dr. Gala Romney- rectosigmoid polyp s/phot snare polypectomy o/w normal rectum and terminal ileum. hyperplastic polyp on bx  . EP IMPLANTABLE DEVICE N/A 02/12/2016   Procedure: Loop Recorder Insertion;  Surgeon: Thompson Grayer, MD;  Location: [place] [facility];  Service: Cardiovascular;  Laterality: N/A;  . ESOPHAGOGASTRODUODENOSCOPY  10/12/2006   Dr. Gala Romney- examination of the tubular esophagus revealed no mucosal abnormalities. the EG junction was easily traversed. small hiatal hernia, the gastric mucosa o/w appeared normal. there was no infiltrating process or frank ulcer seen.  . TEE WITHOUT CARDIOVERSION N/A 02/12/2016   Procedure: TRANSESOPHAGEAL ECHOCARDIOGRAM (TEE);  Surgeon: Jerline Pain, MD;  Location: [facility] ENDOSCOPY;  Service: Cardiovascular;  Laterality: N/A;  . WIRE IN APEX OF RIGHT LUNG     Since childhood    Family History  Problem Relation Age of Onset  . Diabetes Mother   . Heart disease Father   . Cancer Brother   . CVA Neg Hx     Social History   Socioeconomic History  . Marital status: Married    Spouse name: Not on file  . Number of children: Not on file  . Years of education: Not on file  . Highest education level: Not on file  Occupational History  . Occupation: maintenance, parking lots    Employer: SELF-EMPLOYED  Tobacco Use  . Smoking status: Current Every Day Smoker    Packs/day: 0.50    Years: 34.00    Pack years: 17.00    Types: Cigarettes    Start date: 04/08/1973  . Smokeless tobacco: Never Used  Substance and Sexual Activity  . Alcohol use: No    Alcohol/week: 0.0 standard drinks  . Drug use: No  . Sexual  activity: Yes    Partners: Female    Birth control/protection: Pill  Other Topics Concern  . Not on file  Social History Narrative   Drinks Coffee 2 cups daily.  Lives sat home with wife.   Is self employed.  Graduated from High school.     Social Determinants of Health   Financial Resource Strain:   . Difficulty of Paying Living Expenses:   Food Insecurity:   . Worried About Charity fundraiser in the Last Year:   . Arboriculturist in the Last Year:   Transportation Needs:   . Film/video editor (Medical):   Marland Kitchen Lack of Transportation (Non-Medical):   Physical Activity:   . Days of Exercise per Week:   .  Minutes of Exercise per Session:   Stress:   . Feeling of Stress :   Social Connections:   . Frequency of Communication with Friends and Family:   . Frequency of Social Gatherings with Friends and Family:   . Attends Religious Services:   . Active Member of Clubs or Organizations:   . Attends Archivist Meetings:   Marland Kitchen Marital Status:   Intimate Partner Violence:   . Fear of Current or Ex-Partner:   . Emotionally Abused:   Marland Kitchen Physically Abused:   . Sexually Abused:       ROS:  General: Negative for anorexia, weight loss, fever, chills, fatigue, weakness. Eyes: Negative for vision changes.  ENT: Negative for hoarseness, difficulty swallowing , nasal congestion. CV: Negative for chest pain, angina, palpitations, dyspnea on exertion, peripheral edema.  Respiratory: Negative for dyspnea at rest, dyspnea on exertion, cough, sputum, wheezing.  GI: See history of present illness. GU:  Negative for dysuria, hematuria, urinary incontinence, urinary frequency, nocturnal urination.  MS: Negative for joint pain, low back pain.  Derm: Negative for rash or itching.  Neuro: Negative for weakness, abnormal sensation, seizure, frequent headaches, memory loss, confusion.  Psych: Negative for anxiety, depression, suicidal ideation, hallucinations.  Endo: Negative for unusual  weight change.  Heme: Negative for bruising or bleeding. Allergy: Negative for rash or hives.   Observations/Objective: Pleasant well-nourished well-developed male in no acute distress.  He is smoking a cigarette during the visit. BMI 24 on 05/10/19  Lab Results  Component Value Date   TSH 2.04 05/02/2019   Lab Results  Component Value Date   CREATININE 0.78 05/02/2019   BUN 17 05/02/2019   NA 140 05/02/2019   K 4.8 05/02/2019   CL 105 05/02/2019   CO2 28 05/02/2019   Lab Results  Component Value Date   ALT 14 05/02/2019   AST 13 05/02/2019   ALKPHOS 86 10/20/2018   BILITOT 0.4 05/02/2019   Lab Results  Component Value Date   HGBA1C 8.5 (H) 05/02/2019   May 11, 2019: White blood cell count 9300, hemoglobin 15.6, platelets 206,000, folate 7.6, B12 639  Assessment and Plan: Pleasant 59 year old gentleman presenting to schedule screening colonoscopy.  Denies any GI symptoms.  Due to chronic pain medication, previous antidepressant use, previous conscious sedation requirements we will plan on deep sedation. ASA II.  I have discussed the risks, alternatives, benefits with regards to but not limited to the risk of reaction to medication, bleeding, infection, perforation and the patient is agreeable to proceed. Written consent to be obtained.   Follow Up Instructions:    I discussed the assessment and treatment plan with the patient. The patient was provided an opportunity to ask questions and all were answered. The patient agreed with the plan and demonstrated an understanding of the instructions. AVS mailed to patient's home address.   The patient was advised to call back or seek an in-person evaluation if the symptoms worsen or if the condition fails to improve as anticipated.  I provided 10 minutes of virtual face-to-face time during this encounter.   Neil Crouch, PA-C

## 2019-06-17 ENCOUNTER — Telehealth (INDEPENDENT_AMBULATORY_CARE_PROVIDER_SITE_OTHER): Payer: BC Managed Care – PPO | Admitting: Gastroenterology

## 2019-06-17 ENCOUNTER — Encounter: Payer: Self-pay | Admitting: Gastroenterology

## 2019-06-17 DIAGNOSIS — Z79899 Other long term (current) drug therapy: Secondary | ICD-10-CM | POA: Insufficient documentation

## 2019-06-17 DIAGNOSIS — Z1211 Encounter for screening for malignant neoplasm of colon: Secondary | ICD-10-CM

## 2019-06-17 NOTE — Patient Instructions (Signed)
1. Colonoscopy as scheduled. See separate instructions.  

## 2019-07-01 ENCOUNTER — Telehealth: Payer: Self-pay | Admitting: Internal Medicine

## 2019-07-01 NOTE — Telephone Encounter (Signed)
Does this patient need an office visit, or another nurse visit?

## 2019-07-01 NOTE — Telephone Encounter (Signed)
Called spoke with pt. He states his received instructions stating colonoscopy as scheduled but he didn't schedule anything yet. I advised we are awaiting aug/sept schedule and we will call once this is received. Nothing further needed

## 2019-07-18 ENCOUNTER — Ambulatory Visit (INDEPENDENT_AMBULATORY_CARE_PROVIDER_SITE_OTHER): Payer: BC Managed Care – PPO | Admitting: *Deleted

## 2019-07-18 DIAGNOSIS — I639 Cerebral infarction, unspecified: Secondary | ICD-10-CM | POA: Diagnosis not present

## 2019-07-18 LAB — CUP PACEART REMOTE DEVICE CHECK
Date Time Interrogation Session: 20210621003531
Implantable Pulse Generator Implant Date: 20180116

## 2019-07-18 NOTE — Progress Notes (Signed)
[facility]link Summary Report / Loop Recorder 

## 2019-08-03 ENCOUNTER — Telehealth: Payer: Self-pay | Admitting: *Deleted

## 2019-08-03 MED ORDER — NA SULFATE-K SULFATE-MG SULF 17.5-3.13-1.6 GM/177ML PO SOLN: 1.0000 | Freq: Once | ORAL | 0 refills | Status: AC

## 2019-08-03 NOTE — Telephone Encounter (Signed)
Called patient. He is scheduled for TCS with propofol with RMR 8/5 at 8:30am. Patient aware will need COVID test prior. Aware will mail prep instructions with his appt. Confirmed mailing address is correct. confirmed pharmacy.

## 2019-08-11 ENCOUNTER — Ambulatory Visit: Payer: BC Managed Care – PPO | Admitting: "Endocrinology

## 2019-08-18 ENCOUNTER — Other Ambulatory Visit (HOSPITAL_COMMUNITY)
Admission: RE | Admit: 2019-08-18 | Discharge: 2019-08-18 | Disposition: A | Discharge status: Home / Self Care | Payer: BC Managed Care – PPO | Source: Ambulatory Visit | Attending: Internal Medicine | Admitting: Internal Medicine

## 2019-08-18 ENCOUNTER — Other Ambulatory Visit: Payer: Self-pay

## 2019-08-22 ENCOUNTER — Ambulatory Visit (INDEPENDENT_AMBULATORY_CARE_PROVIDER_SITE_OTHER): Payer: BC Managed Care – PPO | Admitting: *Deleted

## 2019-08-22 DIAGNOSIS — I639 Cerebral infarction, unspecified: Secondary | ICD-10-CM | POA: Diagnosis not present

## 2019-08-22 LAB — CUP PACEART REMOTE DEVICE CHECK
Date Time Interrogation Session: 20210725231522
Implantable Pulse Generator Implant Date: 20180116

## 2019-08-23 ENCOUNTER — Other Ambulatory Visit: Payer: Self-pay | Admitting: "Endocrinology

## 2019-08-23 NOTE — Progress Notes (Signed)
[facility]link Summary Report / Loop Recorder 

## 2019-08-25 ENCOUNTER — Other Ambulatory Visit: Payer: Self-pay | Admitting: "Endocrinology

## 2019-08-30 ENCOUNTER — Other Ambulatory Visit: Payer: Self-pay

## 2019-08-30 ENCOUNTER — Other Ambulatory Visit (HOSPITAL_COMMUNITY)
Admission: RE | Admit: 2019-08-30 | Discharge: 2019-08-30 | Disposition: A | Discharge status: Home / Self Care | Payer: BC Managed Care – PPO | Source: Ambulatory Visit | Attending: Internal Medicine | Admitting: Internal Medicine

## 2019-08-30 DIAGNOSIS — Z20822 Contact with and (suspected) exposure to covid-19: Secondary | ICD-10-CM | POA: Diagnosis not present

## 2019-08-30 DIAGNOSIS — Z01812 Encounter for preprocedural laboratory examination: Secondary | ICD-10-CM | POA: Insufficient documentation

## 2019-08-30 LAB — SARS CORONAVIRUS 2 (TAT 6-24 HRS): SARS Coronavirus 2: NEGATIVE

## 2019-09-01 ENCOUNTER — Ambulatory Visit (HOSPITAL_COMMUNITY): Payer: BC Managed Care – PPO | Admitting: Anesthesiology

## 2019-09-01 ENCOUNTER — Other Ambulatory Visit: Payer: Self-pay | Admitting: "Endocrinology

## 2019-09-01 ENCOUNTER — Other Ambulatory Visit: Payer: Self-pay

## 2019-09-01 ENCOUNTER — Ambulatory Visit (HOSPITAL_COMMUNITY)
Admission: RE | Admit: 2019-09-01 | Discharge: 2019-09-01 | Disposition: A | Discharge status: Home / Self Care | Payer: BC Managed Care – PPO | Attending: Internal Medicine | Admitting: Internal Medicine

## 2019-09-01 ENCOUNTER — Encounter (HOSPITAL_COMMUNITY)
Admission: RE | Disposition: A | Discharge status: Home / Self Care | Payer: Self-pay | Source: Home / Self Care | Attending: Internal Medicine

## 2019-09-01 ENCOUNTER — Encounter (HOSPITAL_COMMUNITY): Payer: Self-pay | Admitting: Internal Medicine

## 2019-09-01 DIAGNOSIS — Z955 Presence of coronary angioplasty implant and graft: Secondary | ICD-10-CM | POA: Insufficient documentation

## 2019-09-01 DIAGNOSIS — I252 Old myocardial infarction: Secondary | ICD-10-CM | POA: Insufficient documentation

## 2019-09-01 DIAGNOSIS — Z794 Long term (current) use of insulin: Secondary | ICD-10-CM | POA: Diagnosis not present

## 2019-09-01 DIAGNOSIS — E1142 Type 2 diabetes mellitus with diabetic polyneuropathy: Secondary | ICD-10-CM | POA: Diagnosis not present

## 2019-09-01 DIAGNOSIS — Z8673 Personal history of transient ischemic attack (TIA), and cerebral infarction without residual deficits: Secondary | ICD-10-CM | POA: Insufficient documentation

## 2019-09-01 DIAGNOSIS — K219 Gastro-esophageal reflux disease without esophagitis: Secondary | ICD-10-CM | POA: Insufficient documentation

## 2019-09-01 DIAGNOSIS — F17210 Nicotine dependence, cigarettes, uncomplicated: Secondary | ICD-10-CM | POA: Insufficient documentation

## 2019-09-01 DIAGNOSIS — Z8249 Family history of ischemic heart disease and other diseases of the circulatory system: Secondary | ICD-10-CM | POA: Diagnosis not present

## 2019-09-01 DIAGNOSIS — K449 Diaphragmatic hernia without obstruction or gangrene: Secondary | ICD-10-CM | POA: Insufficient documentation

## 2019-09-01 DIAGNOSIS — Z79899 Other long term (current) drug therapy: Secondary | ICD-10-CM | POA: Diagnosis not present

## 2019-09-01 DIAGNOSIS — E782 Mixed hyperlipidemia: Secondary | ICD-10-CM | POA: Diagnosis not present

## 2019-09-01 DIAGNOSIS — Z809 Family history of malignant neoplasm, unspecified: Secondary | ICD-10-CM | POA: Insufficient documentation

## 2019-09-01 DIAGNOSIS — I10 Essential (primary) hypertension: Secondary | ICD-10-CM | POA: Insufficient documentation

## 2019-09-01 DIAGNOSIS — Z9049 Acquired absence of other specified parts of digestive tract: Secondary | ICD-10-CM | POA: Diagnosis not present

## 2019-09-01 DIAGNOSIS — Z1211 Encounter for screening for malignant neoplasm of colon: Secondary | ICD-10-CM | POA: Diagnosis present

## 2019-09-01 DIAGNOSIS — Z833 Family history of diabetes mellitus: Secondary | ICD-10-CM | POA: Diagnosis not present

## 2019-09-01 DIAGNOSIS — Z7982 Long term (current) use of aspirin: Secondary | ICD-10-CM | POA: Diagnosis not present

## 2019-09-01 DIAGNOSIS — Z7902 Long term (current) use of antithrombotics/antiplatelets: Secondary | ICD-10-CM | POA: Diagnosis not present

## 2019-09-01 DIAGNOSIS — I2510 Atherosclerotic heart disease of native coronary artery without angina pectoris: Secondary | ICD-10-CM | POA: Insufficient documentation

## 2019-09-01 DIAGNOSIS — Z86010 Personal history of colonic polyps: Secondary | ICD-10-CM | POA: Insufficient documentation

## 2019-09-01 HISTORY — PX: COLONOSCOPY WITH PROPOFOL: SHX5780

## 2019-09-01 LAB — GLUCOSE, CAPILLARY: Glucose-Capillary: 135 mg/dL — ABNORMAL HIGH (ref 70–99)

## 2019-09-01 SURGERY — COLONOSCOPY WITH PROPOFOL
Anesthesia: General

## 2019-09-01 MED ORDER — CHLORHEXIDINE GLUCONATE CLOTH 2 % EX PADS: 6.0000 | MEDICATED_PAD | Freq: Once | CUTANEOUS | Status: DC

## 2019-09-01 MED ORDER — STERILE WATER FOR IRRIGATION IR SOLN
Status: DC | PRN
  Administered 2019-09-01: 1.5 mL

## 2019-09-01 MED ORDER — LIDOCAINE HCL (CARDIAC) PF 50 MG/5ML IV SOSY
PREFILLED_SYRINGE | INTRAVENOUS | Status: DC | PRN
Start: 2019-09-01 — End: 2019-09-01
  Administered 2019-09-01: 60 mg via INTRAVENOUS

## 2019-09-01 MED ORDER — LACTATED RINGERS IV SOLN: Freq: Once | INTRAVENOUS | Status: AC

## 2019-09-01 MED ORDER — LACTATED RINGERS IV SOLN: INTRAVENOUS | Status: DC | PRN

## 2019-09-01 MED ORDER — PROPOFOL 10 MG/ML IV BOLUS
INTRAVENOUS | Status: DC | PRN
  Administered 2019-09-01: 150 ug/kg/min via INTRAVENOUS
  Administered 2019-09-01: 80 mg via INTRAVENOUS

## 2019-09-01 NOTE — Transfer of Care (Signed)
Immediate Anesthesia Transfer of Care Note  Patient: Nicholas [name]  Procedure(s) Performed: COLONOSCOPY WITH PROPOFOL (N/A )  Patient Location: Endoscopy Unit  Anesthesia Type:General  Level of Consciousness: awake, alert , oriented and patient cooperative  Airway & Oxygen Therapy: Patient Spontanous Breathing  Post-op Assessment: Report given to RN, Post -op Vital signs reviewed and stable and Patient moving all extremities  Post vital signs: Reviewed and stable  Last Vitals:  Vitals Value Taken Time  BP    Temp    Pulse    Resp    SpO2      Last Pain:  Vitals:   09/01/19 0807  TempSrc:   PainSc: 0-No pain         Complications: No complications documented.

## 2019-09-01 NOTE — Anesthesia Postprocedure Evaluation (Signed)
Anesthesia Post Note  Patient: Nicholas [name]  Procedure(s) Performed: COLONOSCOPY WITH PROPOFOL (N/A )  Patient location during evaluation: Endoscopy Anesthesia Type: General Level of consciousness: awake, oriented, awake and alert and patient cooperative Pain management: pain level controlled Vital Signs Assessment: post-procedure vital signs reviewed and stable Respiratory status: spontaneous breathing, respiratory function stable and nonlabored ventilation Cardiovascular status: blood pressure returned to baseline and stable Postop Assessment: no headache and no backache Anesthetic complications: no   No complications documented.   Last Vitals:  Vitals:   09/01/19 0736  BP: 120/90  Pulse: 99  Resp: 19  Temp: 36.7 C  SpO2: 97%    Last Pain:  Vitals:   09/01/19 0807  TempSrc:   PainSc: 0-No pain                 Tacy Learn

## 2019-09-01 NOTE — Op Note (Signed)
[facility] Patient Name: Nicholas [name] Procedure Date: 09/01/2019 7:53 AM MRN: [id number] Date of Birth: [birth year] Attending MD: Norvel Richards , MD CSN: [id number] Age: 59 Admit Type: Outpatient Procedure:                Colonoscopy Indications:              Screening for colorectal malignant neoplasm Providers:                Norvel Richards, MD, Otis Peak B. Sharon Seller, RN,                            Aram Candela Referring MD:              Medicines:                Propofol per Anesthesia Complications:            No immediate complications. Estimated Blood Loss:     Estimated blood loss: none. Procedure:                Pre-Anesthesia Assessment:                           - Prior to the procedure, a History and Physical                            was performed, and patient medications and                            allergies were reviewed. The patient's tolerance of                            previous anesthesia was also reviewed. The risks                            and benefits of the procedure and the sedation                            options and risks were discussed with the patient.                            All questions were answered, and informed consent                            was obtained. ASA Grade Assessment: II - A patient                            with mild systemic disease. After reviewing the                            risks and benefits, the patient was deemed in                            satisfactory condition to undergo the procedure.  After obtaining informed consent, the colonoscope                            was passed under direct vision. Throughout the                            procedure, the patient's blood pressure, pulse, and                            oxygen saturations were monitored continuously. The                            CF-[id number] ([id number]) scope was introduced through                            the  anus and advanced to the the cecum, identified                            by appendiceal orifice and ileocecal valve. The                            colonoscopy was performed without difficulty. The                            patient tolerated the procedure well. The quality                            of the bowel preparation was adequate. The                            ileocecal valve, appendiceal orifice, and rectum                            were photographed. The entire colon was well                            visualized. Scope In: 8:15:00 AM Scope Out: 8:24:53 AM Scope Withdrawal Time: 0 hours 6 minutes 37 seconds  Total Procedure Duration: 0 hours 9 minutes 53 seconds  Findings:      The perianal and digital rectal examinations were normal.      The colon (entire examined portion) appeared normal.      The retroflexed view of the distal rectum and anal verge was normal and       showed no anal or rectal abnormalities. Estimated blood loss: none. Impression:               - The entire examined colon is normal.                           - The distal rectum and anal verge are normal on                            retroflexion view.                           -  No specimens collected. Moderate Sedation:      Moderate (conscious) sedation was personally administered by an       anesthesia professional. The following parameters were monitored: oxygen       saturation, heart rate, blood pressure, and response to care. Recommendation:           - Patient has a contact number available for                            emergencies. The signs and symptoms of potential                            delayed complications were discussed with the                            patient. Return to normal activities tomorrow.                            Written discharge instructions were provided to the                            patient.                           - Resume previous diet.                            - Repeat colonoscopy in 10 years for screening                            purposes.                           - Return to GI office PRN. Procedure Code(s):        --- Professional ---                           [phone], Colonoscopy, flexible; diagnostic, including                            collection of specimen(s) by brushing or washing,                            when performed (separate procedure) Diagnosis Code(s):        --- Professional ---                           Z12.11, Encounter for screening for malignant                            neoplasm of colon CPT copyright 2019 American Medical Association. All rights reserved. The codes documented in this report are preliminary and upon coder review may  be revised to meet current compliance requirements. [name]. [name], MD Norvel Richards, MD 09/01/2019 8:33:03 AM This report has been signed electronically. Number of Addenda: 0

## 2019-09-01 NOTE — Discharge Instructions (Signed)
Colonoscopy Discharge Instructions  Read the instructions outlined below and refer to this sheet in the next few weeks. These discharge instructions provide you with general information on caring for yourself after you leave the hospital. Your doctor may also give you specific instructions. While your treatment has been planned according to the most current medical practices available, unavoidable complications occasionally occur. If you have any problems or questions after discharge, call Dr. Gala Romney at [phone]. ACTIVITY  You may resume your regular activity, but move at a slower pace for the next 24 hours.   Take frequent rest periods for the next 24 hours.   Walking will help get rid of the air and reduce the bloated feeling in your belly (abdomen).   No driving for 24 hours (because of the medicine (anesthesia) used during the test).    Do not sign any important legal documents or operate any machinery for 24 hours (because of the anesthesia used during the test).  NUTRITION  Drink plenty of fluids.   You may resume your normal diet as instructed by your doctor.   Begin with a light meal and progress to your normal diet. Heavy or fried foods are harder to digest and may make you feel sick to your stomach (nauseated).   Avoid alcoholic beverages for 24 hours or as instructed.  MEDICATIONS  You may resume your normal medications unless your doctor tells you otherwise.  WHAT YOU CAN EXPECT TODAY  Some feelings of bloating in the abdomen.   Passage of more gas than usual.   Spotting of blood in your stool or on the toilet paper.  IF YOU HAD POLYPS REMOVED DURING THE COLONOSCOPY:  No aspirin products for 7 days or as instructed.   No alcohol for 7 days or as instructed.   Eat a soft diet for the next 24 hours.  FINDING OUT THE RESULTS OF YOUR TEST Not all test results are available during your visit. If your test results are not back during the visit, make an appointment  with your caregiver to find out the results. Do not assume everything is normal if you have not heard from your caregiver or the medical facility. It is important for you to follow up on all of your test results.  SEEK IMMEDIATE MEDICAL ATTENTION IF:  You have more than a spotting of blood in your stool.   Your belly is swollen (abdominal distention).   You are nauseated or vomiting.   You have a temperature over 101.   You have abdominal pain or discomfort that is severe or gets worse throughout the day.   Your colonoscopy was normal today  I recommend you return in 10 years and have 1 more screening colonoscopy  Patient request, I called Cayleb Jarnigan at [phone] -left message with results on answering service     Monitored Anesthesia Care, Care After These instructions provide you with information about caring for yourself after your procedure. Your health care provider may also give you more specific instructions. Your treatment has been planned according to current medical practices, but problems sometimes occur. Call your health care provider if you have any problems or questions after your procedure. What can I expect after the procedure? After your procedure, you may:  Feel sleepy for several hours.  Feel clumsy and have poor balance for several hours.  Feel forgetful about what happened after the procedure.  Have poor judgment for several hours.  Feel nauseous or vomit.  Have a sore throat  if you had a breathing tube during the procedure. Follow these instructions at home: For at least 24 hours after the procedure:      Have a responsible adult stay with you. It is important to have someone help care for you until you are awake and alert.  Rest as needed.  Do not: ? Participate in activities in which you could fall or become injured. ? Drive. ? Use heavy machinery. ? Drink alcohol. ? Take sleeping pills or medicines that cause drowsiness. ? Make  important decisions or sign legal documents. ? Take care of children on your own. Eating and drinking  Follow the diet that is recommended by your health care provider.  If you vomit, drink water, juice, or soup when you can drink without vomiting.  Make sure you have little or no nausea before eating solid foods. General instructions  Take over-the-counter and prescription medicines only as told by your health care provider.  If you have sleep apnea, surgery and certain medicines can increase your risk for breathing problems. Follow instructions from your health care provider about wearing your sleep device: ? Anytime you are sleeping, including during daytime naps. ? While taking prescription pain medicines, sleeping medicines, or medicines that make you drowsy.  If you smoke, do not smoke without supervision.  Keep all follow-up visits as told by your health care provider. This is important. Contact a health care provider if:  You keep feeling nauseous or you keep vomiting.  You feel light-headed.  You develop a rash.  You have a fever. Get help right away if:  You have trouble breathing. Summary  For several hours after your procedure, you may feel sleepy and have poor judgment.  Have a responsible adult stay with you for at least 24 hours or until you are awake and alert. This information is not intended to replace advice given to you by your health care provider. Make sure you discuss any questions you have with your health care provider. Document Revised: 04/13/2017 Document Reviewed: 05/06/2015 Elsevier Patient Education  [place].

## 2019-09-01 NOTE — H&P (Signed)
@LOGO @   Primary Care Physician:  Sharilyn Sites, MD Primary Gastroenterologist:  Dr. Gala Romney  Pre-Procedure History & Physical: HPI:  Nicholas [name] is a 59 y.o. male is here for a screening colonoscopy.  Negative colonoscopy 2008 (hyperplastic polyps).  No GI symptoms.  On Plavix.  Here for average rescreening colonoscopy.  Past Medical History:  Diagnosis Date  . Anal lesion    Anal papilla/tags - external lesion  . Bilateral carpal tunnel syndrome 11/24/2018  . Colon polyp    Hyperplastic rectosigmoid polyp 9/08  . Coronary atherosclerosis of native coronary artery    DES to PLB, BMS to mid and distal RCA, DES to circumflex and distal LAD, 6/10 (5 stents)  . Diabetic peripheral neuropathy ([place]) 11/24/2018  . GERD (gastroesophageal reflux disease)   . Helicobacter pylori (H. pylori) 09/2009   Treated with helidac  . Hiatal hernia   . Hyperplastic colon polyp 2008  . Mixed hyperlipidemia   . Myocardial infarction ([place]) 06/2008  . Stroke [facility])    January 2018  . Type 2 diabetes mellitus ([place])     Past Surgical History:  Procedure Laterality Date  . CHOLECYSTECTOMY    . COLLAPSED LUNG     15 years ago  . COLONOSCOPY  10/12/2006   Dr. Gala Romney- rectosigmoid polyp s/phot snare polypectomy o/w normal rectum and terminal ileum. hyperplastic polyp on bx  . EP IMPLANTABLE DEVICE N/A 02/12/2016   Procedure: Loop Recorder Insertion;  Surgeon: Thompson Grayer, MD;  Location: [place] CV LAB;  Service: Cardiovascular;  Laterality: N/A;  . ESOPHAGOGASTRODUODENOSCOPY  10/12/2006   Dr. Gala Romney- examination of the tubular esophagus revealed no mucosal abnormalities. the EG junction was easily traversed. small hiatal hernia, the gastric mucosa o/w appeared normal. there was no infiltrating process or frank ulcer seen.  . TEE WITHOUT CARDIOVERSION N/A 02/12/2016   Procedure: TRANSESOPHAGEAL ECHOCARDIOGRAM (TEE);  Surgeon: Jerline Pain, MD;  Location: [facility] ENDOSCOPY;  Service: Cardiovascular;   Laterality: N/A;  . WIRE IN APEX OF RIGHT LUNG     Since childhood    Prior to Admission medications   Medication Sig Start Date End Date Taking? Authorizing Provider  aspirin EC 325 MG tablet Take 325 mg by mouth daily.   Yes [provider]  clopidogrel (PLAVIX) 75 MG tablet TAKE (1) TABLET BY MOUTH ONCE DAILY. Patient taking differently: Take 75 mg by mouth daily.  08/20/18  Yes Strader, [place] M, PA-C  gabapentin (NEURONTIN) 100 MG capsule Take 100 mg by mouth daily.  01/29/19  Yes [provider]  Insulin Glargine (BASAGLAR KWIKPEN) 100 UNIT/ML Inject 0.6 mLs (60 Units total) into the skin at bedtime. Patient taking differently: Inject 50 Units into the skin at bedtime.  05/10/19  Yes Nida, Marella Chimes, MD  pantoprazole (PROTONIX) 40 MG tablet Take 40 mg by mouth daily.  04/11/10  Yes [provider]  traMADol (ULTRAM) 50 MG tablet Take 1 tablet (50 mg total) by mouth every 6 (six) hours as needed for moderate pain. Patient taking differently: Take 50 mg by mouth daily.  02/12/16  Yes Geradine Girt, DO  Vitamin D, Ergocalciferol, (DRISDOL) 1.25 MG (50000 UNIT) CAPS capsule Take 1 capsule (50,000 Units total) by mouth every 7 (seven) days. Patient taking differently: Take 50,000 Units by mouth every Sunday.  05/10/19  Yes Nida, Marella Chimes, MD  albuterol (VENTOLIN HFA) 108 (90 Base) MCG/ACT inhaler Inhale 1-2 puffs into the lungs every 6 (six) hours as needed for wheezing or shortness of  breath.    [provider]  atorvastatin (LIPITOR) 80 MG tablet Take 1 tablet (80 mg total) by mouth daily. Patient not taking: Reported on 08/09/2019 09/06/18 08/09/19  Barrett, Evelene Croon, PA-C  BD PEN NEEDLE NANO U/F 32G X 4 MM MISC USE AS DIRECTED FOUR TIMES DAILY. 08/31/17   Cassandria Anger, MD  Continuous Blood Gluc Sensor (FREESTYLE LIBRE 14 DAY SENSOR) MISC APPLY 1 PATCH EVERY 14 DAYS 05/05/19   Cassandria Anger, MD  insulin aspart (NOVOLOG FLEXPEN) 100  UNIT/ML FlexPen Inject 12-18 Units into the skin 3 (three) times daily with meals. Patient not taking: Reported on 08/09/2019 09/30/16   Cassandria Anger, MD  nitroGLYCERIN (NITROSTAT) 0.4 MG SL tablet DISSOLVE 1 TABLET UNDER TONGUE EVERY 5 MINUTES UP TO 15 MIN FOR CHESTPAIN. IF NO RELIEF CALL 911. Patient taking differently: Place 0.4 mg under the tongue every 5 (five) minutes as needed for chest pain. IF NO RELIEF CALL 911. 05/17/19   Satira Sark, MD  SUPREP BOWEL PREP KIT 17.5-3.13-1.6 GM/177ML SOLN Take 354 mLs by mouth once. 08/03/19   [provider]    Allergies as of 08/03/2019  . (No Known Allergies)    Family History  Problem Relation Age of Onset  . Diabetes Mother   . Heart disease Father   . Cancer Brother   . CVA Neg Hx     Social History   Socioeconomic History  . Marital status: Married    Spouse name: Not on file  . Number of children: Not on file  . Years of education: Not on file  . Highest education level: Not on file  Occupational History  . Occupation: maintenance, parking lots    Employer: SELF-EMPLOYED  Tobacco Use  . Smoking status: Current Every Day Smoker    Packs/day: 0.50    Years: 34.00    Pack years: 17.00    Types: Cigarettes    Start date: 04/08/1973  . Smokeless tobacco: Never Used  Vaping Use  . Vaping Use: Never used  Substance and Sexual Activity  . Alcohol use: No    Alcohol/week: 0.0 standard drinks  . Drug use: No  . Sexual activity: Yes    Partners: Female    Birth control/protection: Pill  Other Topics Concern  . Not on file  Social History Narrative   Drinks Coffee 2 cups daily.  Lives sat home with wife.   Is self employed.  Graduated from High school.     Social Determinants of Health   Financial Resource Strain:   . Difficulty of Paying Living Expenses:   Food Insecurity:   . Worried About Charity fundraiser in the Last Year:   . Arboriculturist in the Last Year:   Transportation Needs:   . Consulting civil engineer (Medical):   Marland Kitchen Lack of Transportation (Non-Medical):   Physical Activity:   . Days of Exercise per Week:   . Minutes of Exercise per Session:   Stress:   . Feeling of Stress :   Social Connections:   . Frequency of Communication with Friends and Family:   . Frequency of Social Gatherings with Friends and Family:   . Attends Religious Services:   . Active Member of Clubs or Organizations:   . Attends Archivist Meetings:   Marland Kitchen Marital Status:   Intimate Partner Violence:   . Fear of Current or Ex-Partner:   . Emotionally Abused:   Marland Kitchen Physically Abused:   .  Sexually Abused:     Review of Systems: See HPI, otherwise negative ROS  Physical Exam: BP 120/90   Pulse 99   Temp 98.1 F (36.7 C) (Oral)   Resp 19   Ht 5' 9"  (1.753 m)   SpO2 97%   BMI 24.34 kg/m  General:   Alert,  Well-developed, well-nourished, pleasant and cooperative in NAD Lungs:  Clear throughout to auscultation.   No wheezes, crackles, or rhonchi. No acute distress. Heart:  Regular rate and rhythm; no murmurs, clicks, rubs,  or gallops. Abdomen:  Soft, nontender and nondistended. No masses, hepatosplenomegaly or hernias noted. Normal bowel sounds, without guarding, and without rebound.    Impression/Plan: QUINCE SANTANA is now here to undergo a screening colonoscopy.  Average rescreening examination.  Risks, benefits, limitations, imponderables and alternatives regarding colonoscopy have been reviewed with the patient. Questions have been answered. All parties agreeable.     Notice:  This dictation was prepared with Dragon dictation along with smaller phrase technology. Any transcriptional errors that result from this process are unintentional and may not be corrected upon review.

## 2019-09-01 NOTE — Anesthesia Preprocedure Evaluation (Addendum)
Anesthesia Evaluation  Patient identified by MRN, date of birth, ID band Patient awake    Reviewed: Allergy & Precautions, NPO status , Patient's Chart, lab work & pertinent test results  History of Anesthesia Complications Negative for: history of anesthetic complications  Airway Mallampati: II  TM Distance: >3 FB Neck ROM: Full    Dental no notable dental hx. (+) Edentulous Upper, Edentulous Lower   Pulmonary Current SmokerPatient did not abstain from smoking.,  collapsed lung - 20 years ago    Pulmonary exam normal breath sounds clear to auscultation       Cardiovascular hypertension, Pt. on medications + CAD, + Past MI and + Cardiac Stents (stentsx5, stents in 10 years ago, on plavix, last dose 08/31/2019)   Rhythm:Regular Rate:Normal     Neuro/Psych TIA Neuromuscular disease CVA, No Residual Symptoms    GI/Hepatic Neg liver ROS, hiatal hernia, GERD  Medicated,  Endo/Other  diabetes, Well Controlled, Type 2, Insulin Dependent  Renal/GU negative Renal ROS     Musculoskeletal negative musculoskeletal ROS (+)   Abdominal   Peds  Hematology negative hematology ROS (+)   Anesthesia Other Findings   Reproductive/Obstetrics negative OB ROS                            Anesthesia Physical Anesthesia Plan  ASA: III  Anesthesia Plan: General   Post-op Pain Management:    Induction: Intravenous  PONV Risk Score and Plan: Treatment may vary due to age or medical condition  Airway Management Planned: Nasal Cannula, Natural Airway and Simple Face Mask  Additional Equipment:   Intra-op Plan:   Post-operative Plan:   Informed Consent: I have reviewed the patients History and Physical, chart, labs and discussed the procedure including the risks, benefits and alternatives for the proposed anesthesia with the patient or authorized representative who has indicated his/her understanding and  acceptance.     Dental advisory given  Plan Discussed with: CRNA and Surgeon  Anesthesia Plan Comments:        Anesthesia Quick Evaluation

## 2019-09-03 ENCOUNTER — Other Ambulatory Visit: Payer: Self-pay | Admitting: "Endocrinology

## 2019-09-06 ENCOUNTER — Encounter (HOSPITAL_COMMUNITY): Payer: Self-pay | Admitting: Internal Medicine

## 2019-09-22 ENCOUNTER — Telehealth: Payer: Self-pay

## 2019-09-22 LAB — CUP PACEART REMOTE DEVICE CHECK
Date Time Interrogation Session: 20210826000500
Implantable Pulse Generator Implant Date: 20180116

## 2019-09-22 NOTE — Telephone Encounter (Signed)
[employer] alert received 09/22/19 that ILR has reached RRT as of 09/18/19.  Called patient and discussed options to leave in or remove. Patient is interested in having another ILR placed to continue monitoring.  Advised patient I will forward this to Dr. Rayann Heman and see what his recommendations are.

## 2019-09-26 ENCOUNTER — Other Ambulatory Visit: Payer: Self-pay

## 2019-09-26 ENCOUNTER — Ambulatory Visit (INDEPENDENT_AMBULATORY_CARE_PROVIDER_SITE_OTHER): Payer: BC Managed Care – PPO | Admitting: *Deleted

## 2019-09-26 DIAGNOSIS — I639 Cerebral infarction, unspecified: Secondary | ICD-10-CM | POA: Diagnosis not present

## 2019-09-26 MED ORDER — INSULIN GLARGINE 100 UNITS/ML SOLOSTAR PEN: 60.0000 [IU] | PEN_INJECTOR | Freq: Every day | SUBCUTANEOUS | 0 refills | Status: DC

## 2019-09-27 NOTE — Progress Notes (Signed)
[facility]link Summary Report / Loop Recorder 

## 2019-09-29 ENCOUNTER — Other Ambulatory Visit: Payer: Self-pay

## 2019-09-29 ENCOUNTER — Encounter: Payer: Self-pay | Admitting: Internal Medicine

## 2019-09-29 ENCOUNTER — Ambulatory Visit: Payer: BC Managed Care – PPO | Admitting: Internal Medicine

## 2019-09-29 VITALS — BP 110/70 | HR 86 | Ht 68.0 in | Wt 163.4 lb

## 2019-09-29 DIAGNOSIS — Z720 Tobacco use: Secondary | ICD-10-CM | POA: Diagnosis not present

## 2019-09-29 DIAGNOSIS — I639 Cerebral infarction, unspecified: Secondary | ICD-10-CM | POA: Diagnosis not present

## 2019-09-29 HISTORY — PX: OTHER SURGICAL HISTORY: SHX169

## 2019-09-29 NOTE — Progress Notes (Signed)
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 male who presents today for routine electrophysiology followup.  Since last being seen in our clinic, the patient reports doing very well.   Today, he denies symptoms of palpitations, chest pain, shortness of breath,  lower extremity edema, dizziness, presyncope, or syncope.  The patient is otherwise without complaint today.   Past Medical History:  Diagnosis Date  . Anal lesion    Anal papilla/tags - external lesion  . Bilateral carpal tunnel syndrome 11/24/2018  . Colon polyp    Hyperplastic rectosigmoid polyp 9/08  . Coronary atherosclerosis of native coronary artery    DES to PLB, BMS to mid and distal RCA, DES to circumflex and distal LAD, 6/10 (5 stents)  . Diabetic peripheral neuropathy ([place]) 11/24/2018  . GERD (gastroesophageal reflux disease)   . Helicobacter pylori (H. pylori) 09/2009   Treated with helidac  . Hiatal hernia   . Hyperplastic colon polyp 2008  . Mixed hyperlipidemia   . Myocardial infarction ([place]) 06/2008  . Stroke [facility])    January 2018  . Type 2 diabetes mellitus ([place])    Past Surgical History:  Procedure Laterality Date  . CHOLECYSTECTOMY    . COLLAPSED LUNG     15 years ago  . COLONOSCOPY  10/12/2006   Dr. Gala Romney- rectosigmoid polyp s/phot snare polypectomy o/w normal rectum and terminal ileum. hyperplastic polyp on bx  . COLONOSCOPY WITH PROPOFOL N/A 09/01/2019   Procedure: COLONOSCOPY WITH PROPOFOL;  Surgeon: Daneil Dolin, MD;  [facility];  Service: Endoscopy;  Laterality: N/A;  8:30am  . EP IMPLANTABLE DEVICE N/A 02/12/2016   Procedure: Loop Recorder Insertion;  Surgeon: Thompson Grayer, MD;  Location: [place] CV LAB;  Service: Cardiovascular;  Laterality: N/A;  . ESOPHAGOGASTRODUODENOSCOPY  10/12/2006   Dr. Gala Romney- examination of the tubular esophagus revealed no mucosal abnormalities. the EG junction was easily traversed. small hiatal hernia, the gastric  mucosa o/w appeared normal. there was no infiltrating process or frank ulcer seen.  . TEE WITHOUT CARDIOVERSION N/A 02/12/2016   Procedure: TRANSESOPHAGEAL ECHOCARDIOGRAM (TEE);  Surgeon: Jerline Pain, MD;  Location: [facility] ENDOSCOPY;  Service: Cardiovascular;  Laterality: N/A;  . WIRE IN APEX OF RIGHT LUNG     Since childhood    ROS- all systems are reviewed and negatives except as per HPI above  Current Outpatient Medications  Medication Sig Dispense Refill  . albuterol (VENTOLIN HFA) 108 (90 Base) MCG/ACT inhaler Inhale 1-2 puffs into the lungs every 6 (six) hours as needed for wheezing or shortness of breath.    Marland Kitchen aspirin EC 325 MG tablet Take 325 mg by mouth daily.    . BD PEN NEEDLE NANO U/F 32G X 4 MM MISC USE AS DIRECTED FOUR TIMES DAILY. 100 each 2  . clopidogrel (PLAVIX) 75 MG tablet TAKE (1) TABLET BY MOUTH ONCE DAILY. 90 tablet 3  . Continuous Blood Gluc Sensor (FREESTYLE LIBRE 14 DAY SENSOR) MISC APPLY 1  EVERY TWO WEEKS 2 each 0  . gabapentin (NEURONTIN) 100 MG capsule Take 100 mg by mouth daily.     . insulin glargine (LANTUS) 100 unit/mL SOPN Inject 0.6 mLs (60 Units total) into the skin at bedtime. 15 mL 0  . nitroGLYCERIN (NITROSTAT) 0.4 MG SL tablet DISSOLVE 1 TABLET UNDER TONGUE EVERY 5 MINUTES UP TO 15 MIN FOR CHESTPAIN. IF NO RELIEF CALL 911. 25 tablet 3  . pantoprazole (PROTONIX) 40  MG tablet Take 40 mg by mouth daily.     Manus Gunning BOWEL PREP KIT 17.5-3.13-1.6 GM/177ML SOLN Take 354 mLs by mouth once.    . traMADol (ULTRAM) 50 MG tablet Take 1 tablet (50 mg total) by mouth every 6 (six) hours as needed for moderate pain. 15 tablet 0  . Vitamin D, Ergocalciferol, (DRISDOL) 1.25 MG (50000 UNIT) CAPS capsule Take 1 capsule (50,000 Units total) by mouth every 7 (seven) days. 12 capsule 0   No current facility-administered medications for this visit.    Physical Exam: Vitals:   09/29/19 0935  BP: 110/70  Pulse: 86  SpO2: 95%  Weight: 163 lb 6.4 oz (74.1 kg)  Height: 5'  8" (1.727 m)    GEN- The patient is well appearing, alert and oriented x 3 today.   Head- normocephalic, atraumatic Eyes-  Sclera clear, conjunctiva pink Ears- hearing intact Oropharynx- clear Lungs-   normal work of breathing Heart- Regular rate and rhythm  GI- sof  Extremities- no clubbing, cyanosis, or edema  Wt Readings from Last 3 Encounters:  09/29/19 163 lb 6.4 oz (74.1 kg)  05/10/19 164 lb 12.8 oz (74.8 kg)  10/20/18 160 lb (72.6 kg)    EKG tracing ordered today is personally reviewed and shows sinus with incomplete RBBB  Assessment and Plan:  1. Cryptogenic stroke He has been monitored for 3 years without any AF detected on his ILR. His device has reached end of service.  He and I have discussed options at length including whether or not to replace the device.  He wishes to have his old ILR removed and will contact my office if he decides in the future to have a new one placed.  He would like to check with his insurance before a new device is placed.  Risks and benefits to ILR removal were discussed at length with the patient today, including but not limited to risks of bleeding and infection.  Extensive device education was performed.  The patient understands and wishes to proceed.    2. Tobacco Cessation advised  Thompson Grayer MD, [facility] 09/29/2019 9:38 AM     PROCEDURES:   1. Implantable loop recorder explantation       DESCRIPTION OF PROCEDURE:  Informed written consent was obtained.  The patient required no sedation for the procedure today.   The patients left chest was therefore prepped and draped in the usual sterile fashion.  The skin overlying the ILR monitor was infiltrated with lidocaine for local analgesia.  A 0.5-cm incision was made over the site.  The previously implanted ILR was exposed and removed using a combination of sharp and blunt dissection.  Steri- Strips and a sterile dressing were then applied. EBL<51m.  There were no early apparent complications.      CONCLUSIONS:   1. Successful explantation of a [employer] Reveal LINQ implantable loop recorder   2. No early apparent complications.        JThompson GrayerMD, [facility], [place]9/02/2019 10:34 AM

## 2019-09-29 NOTE — Patient Instructions (Signed)
Medication Instructions:  Your physician recommends that you continue on your current medications as directed. Please refer to the Current Medication list given to you today.  Labwork: None ordered.  Testing/Procedures: Loop reimplant codes for your insurance: Implant procedure: 25638 Monthly surveillance codes:  [id number]  Follow-Up:  Your physician wants you to follow-up in: as needed with Dr. Rayann Heman.     Implantable Loop Recorder Removal, Care After This sheet gives you information about how to care for yourself after your procedure. Your health care provider may also give you more specific instructions. If you have problems or questions, contact your health care provider. What can I expect after the procedure? After the procedure, it is common to have:  Soreness or discomfort near the incision.  Some swelling or bruising near the incision.  Follow these instructions at home: Incision care  1.  Leave your outer dressing on for 24 hours.  After 24 hours you can remove your outer dressing and shower. 2. Leave adhesive strips in place. These skin closures may need to stay in place for 1-2 weeks. If adhesive strip edges start to loosen and curl up, you may trim the loose edges.  You may remove the strips if they have not fallen off after 2 weeks. 3. Check your incision area every day for signs of infection. Check for: a. Redness, swelling, or pain. b. Fluid or blood. c. Warmth. d. Pus or a bad smell. 4. Do not take baths, swim, or use a hot tub until your incision is completely healed. 5. If your wound site starts to bleed apply pressure.      If you have any questions/concerns please call the device clinic at [phone].  Activity  Return to your normal activities.  Contact a health care provider if:  You have redness, swelling, or pain around your incision.  You have a fever.

## 2019-10-23 ENCOUNTER — Other Ambulatory Visit: Payer: Self-pay | Admitting: Student

## 2019-10-24 NOTE — Telephone Encounter (Signed)
This is a [place] pt.  °

## 2019-12-16 ENCOUNTER — Ambulatory Visit (INDEPENDENT_AMBULATORY_CARE_PROVIDER_SITE_OTHER): Payer: BC Managed Care – PPO | Admitting: "Endocrinology

## 2019-12-16 ENCOUNTER — Other Ambulatory Visit: Payer: Self-pay

## 2019-12-16 ENCOUNTER — Encounter: Payer: Self-pay | Admitting: "Endocrinology

## 2019-12-16 VITALS — BP 107/75 | HR 85 | Ht 68.0 in | Wt 166.0 lb

## 2019-12-16 DIAGNOSIS — I10 Essential (primary) hypertension: Secondary | ICD-10-CM

## 2019-12-16 DIAGNOSIS — E1159 Type 2 diabetes mellitus with other circulatory complications: Secondary | ICD-10-CM | POA: Diagnosis not present

## 2019-12-16 DIAGNOSIS — E559 Vitamin D deficiency, unspecified: Secondary | ICD-10-CM | POA: Diagnosis not present

## 2019-12-16 DIAGNOSIS — E782 Mixed hyperlipidemia: Secondary | ICD-10-CM

## 2019-12-16 LAB — HEMOGLOBIN A1C: Hemoglobin A1C: 9.1

## 2019-12-16 MED ORDER — FREESTYLE LIBRE 2 SENSOR MISC: 1.0000 | 3 refills | Status: DC

## 2019-12-16 MED ORDER — FREESTYLE LIBRE 2 READER DEVI
0 refills | Status: DC
Start: 2019-12-16 — End: 2022-07-11

## 2019-12-16 MED ORDER — LANTUS SOLOSTAR 100 UNIT/ML ~~LOC~~ SOPN
40.0000 [IU] | PEN_INJECTOR | Freq: Every day | SUBCUTANEOUS | 2 refills | Status: DC
Start: 2019-12-16 — End: 2020-05-02

## 2019-12-16 NOTE — Progress Notes (Signed)
12/16/2019  Endocrinology follow-up note  Subjective:    Patient ID: Nicholas [name], male    DOB: July 19, 1960,    Past Medical History:  Diagnosis Date  . Anal lesion    Anal papilla/tags - external lesion  . Bilateral carpal tunnel syndrome 11/24/2018  . Colon polyp    Hyperplastic rectosigmoid polyp 9/08  . Coronary atherosclerosis of native coronary artery    DES to PLB, BMS to mid and distal RCA, DES to circumflex and distal LAD, 6/10 (5 stents)  . Diabetic peripheral neuropathy ([place]) 11/24/2018  . GERD (gastroesophageal reflux disease)   . Helicobacter pylori (H. pylori) 09/2009   Treated with helidac  . Hiatal hernia   . Hyperplastic colon polyp 2008  . Mixed hyperlipidemia   . Myocardial infarction ([place]) 06/2008  . Stroke [facility])    January 2018  . Type 2 diabetes mellitus ([place])    Past Surgical History:  Procedure Laterality Date  . CHOLECYSTECTOMY    . COLLAPSED LUNG     15 years ago  . COLONOSCOPY  10/12/2006   Dr. Gala Romney- rectosigmoid polyp s/phot snare polypectomy o/w normal rectum and terminal ileum. hyperplastic polyp on bx  . COLONOSCOPY WITH PROPOFOL N/A 09/01/2019   Procedure: COLONOSCOPY WITH PROPOFOL;  Surgeon: Daneil Dolin, MD;  Location: [facility];  Service: Endoscopy;  Laterality: N/A;  8:30am  . EP IMPLANTABLE DEVICE N/A 02/12/2016   Procedure: Loop Recorder Insertion;  Surgeon: Thompson Grayer, MD;  Location: [place] CV LAB;  Service: Cardiovascular;  Laterality: N/A;  . ESOPHAGOGASTRODUODENOSCOPY  10/12/2006   Dr. Gala Romney- examination of the tubular esophagus revealed no mucosal abnormalities. the EG junction was easily traversed. small hiatal hernia, the gastric mucosa o/w appeared normal. there was no infiltrating process or frank ulcer seen.  . implantable loop recorder removal  09/29/2019   MDT Reveal LINQ removed in office by Dr Rayann Heman  . TEE WITHOUT CARDIOVERSION N/A 02/12/2016   Procedure: TRANSESOPHAGEAL ECHOCARDIOGRAM (TEE);  Surgeon: Jerline Pain, MD;  Location[facility]COPY;  Service: Cardiovascular;  Laterality: N/A;  . WIRE IN APEX OF RIGHT LUNG     Since childhood   Social History   Socioeconomic History  . Marital status: Married    Spouse name: Not on file  . Number of children: Not on file  . Years of education: Not on file  . Highest education level: Not on file  Occupational History  . Occupation: maintenance, parking lots    Employer: SELF-EMPLOYED  Tobacco Use  . Smoking status: Current Every Day Smoker    Packs/day: 0.50    Years: 34.00    Pack years: 17.00    Types: Cigarettes    Start date: 04/08/1973  . Smokeless tobacco: Never Used  Vaping Use  . Vaping Use: Never used  Substance and Sexual Activity  . Alcohol use: No    Alcohol/week: 0.0 standard drinks  . Drug use: No  . Sexual activity: Yes    Partners: Female    Birth control/protection: Pill  Other Topics Concern  . Not on file  Social History Narrative   Drinks Coffee 2 cups daily.  Lives sat home with wife.   Is self employed.  Graduated from High school.     Social Determinants of Health   Financial Resource Strain:   . Difficulty of Paying Living Expenses: Not on file  Food Insecurity:   . Worried About Charity fundraiser in the Last Year: Not on file  .  Ran Out of Food in the Last Year: Not on file  Transportation Needs:   . Lack of Transportation (Medical): Not on file  . Lack of Transportation (Non-Medical): Not on file  Physical Activity:   . Days of Exercise per Week: Not on file  . Minutes of Exercise per Session: Not on file  Stress:   . Feeling of Stress : Not on file  Social Connections:   . Frequency of Communication with Friends and Family: Not on file  . Frequency of Social Gatherings with Friends and Family: Not on file  . Attends Religious Services: Not on file  . Active Member of Clubs or Organizations: Not on file  . Attends Archivist Meetings: Not on file  . Marital Status: Not on file    Outpatient Encounter Medications as of 12/16/2019  Medication Sig  . Insulin Aspart (NOVOLOG FLEXPEN [place]) Inject 10-16 Units into the skin 3 (three) times daily before meals.  Marland Kitchen albuterol (VENTOLIN HFA) 108 (90 Base) MCG/ACT inhaler Inhale 1-2 puffs into the lungs every 6 (six) hours as needed for wheezing or shortness of breath.  Marland Kitchen aspirin EC 325 MG tablet Take 325 mg by mouth daily.  . BD PEN NEEDLE NANO U/F 32G X 4 MM MISC USE AS DIRECTED FOUR TIMES DAILY.  Marland Kitchen clopidogrel (PLAVIX) 75 MG tablet Take 1 tablet by mouth once daily  . Continuous Blood Gluc Receiver (FREESTYLE LIBRE 2 READER) DEVI As directed  . Continuous Blood Gluc Sensor (FREESTYLE LIBRE 2 SENSOR) MISC 1 Piece by Does not apply route every 14 (fourteen) days.  Marland Kitchen gabapentin (NEURONTIN) 100 MG capsule Take 100 mg by mouth daily.   . insulin glargine (LANTUS SOLOSTAR) 100 UNIT/ML Solostar Pen Inject 40 Units into the skin daily.  . nitroGLYCERIN (NITROSTAT) 0.4 MG SL tablet DISSOLVE 1 TABLET UNDER TONGUE EVERY 5 MINUTES UP TO 15 MIN FOR CHESTPAIN. IF NO RELIEF CALL 911.  . pantoprazole (PROTONIX) 40 MG tablet Take 40 mg by mouth daily.   Manus Gunning BOWEL PREP KIT 17.5-3.13-1.6 GM/177ML SOLN Take 354 mLs by mouth once.  . traMADol (ULTRAM) 50 MG tablet Take 1 tablet (50 mg total) by mouth every 6 (six) hours as needed for moderate pain.  . [DISCONTINUED] Continuous Blood Gluc Sensor (FREESTYLE LIBRE 14 DAY SENSOR) MISC APPLY 1  EVERY TWO WEEKS  . [DISCONTINUED] insulin glargine (LANTUS) 100 unit/mL SOPN Inject 0.6 mLs (60 Units total) into the skin at bedtime. (Patient taking differently: Inject 50 Units into the skin at bedtime. )  . [DISCONTINUED] Vitamin D, Ergocalciferol, (DRISDOL) 1.25 MG (50000 UNIT) CAPS capsule Take 1 capsule (50,000 Units total) by mouth every 7 (seven) days.   No facility-administered encounter medications on file as of 12/16/2019.   ALLERGIES: No Known Allergies VACCINATION STATUS:  There is no  immunization history on file for this patient.  Diabetes He presents for his follow-up diabetic visit. He has type 2 diabetes mellitus. Onset time: He was diagnosed at approximate age of 31 years. In this particular patient with a history of heavy alcohol use possiblity of pancreatic diabetes is high. His disease course has been worsening. Pertinent negatives for hypoglycemia include no confusion, headaches, pallor, seizures or speech difficulty. Associated symptoms include polydipsia and polyuria. Pertinent negatives for diabetes include no chest pain, no fatigue, no polyphagia and no weakness. There are no hypoglycemic complications. Symptoms are worsening. Diabetic complications include a CVA and heart disease. Risk factors for coronary artery disease include diabetes mellitus, dyslipidemia,  hypertension, male sex, sedentary lifestyle and tobacco exposure. Current diabetic treatment includes oral agent (dual therapy). He is compliant with treatment some of the time. His weight is fluctuating minimally. He is following a generally unhealthy diet. When asked about meal planning, he reported none. He has not had a previous visit with a dietitian (he did not keep appointment.). He rarely participates in exercise. His home blood glucose trend is decreasing steadily. (He missed his appointment since April 2021.  He presents with no logs nor meter.  He did not bring his CGM device either.  He says he ran out of NovoLog, currently using only Lantus 50 units nightly.  His point-of-care A1c 9.1%, increasing from 7.3%.  ) Eye exam is current.  Hyperlipidemia This is a chronic problem. The current episode started more than 1 year ago. Exacerbating diseases include diabetes. Pertinent negatives include no chest pain, myalgias or shortness of breath. Current antihyperlipidemic treatment includes statins. Risk factors for coronary artery disease include dyslipidemia, diabetes mellitus, hypertension and a sedentary  lifestyle.  Hypertension This is a chronic problem. The current episode started more than 1 year ago. The problem is controlled. Pertinent negatives include no chest pain, headaches, neck pain, palpitations or shortness of breath. Risk factors for coronary artery disease include diabetes mellitus, dyslipidemia and smoking/tobacco exposure. Past treatments include beta blockers. Hypertensive end-organ damage includes CAD/MI and CVA.    Review of Systems  Constitutional: Negative for fatigue and unexpected weight change.  HENT: Negative for dental problem, mouth sores and trouble swallowing.   Eyes: Negative for visual disturbance.  Respiratory: Negative for cough, choking, chest tightness, shortness of breath and wheezing.   Cardiovascular: Negative for chest pain, palpitations and leg swelling.  Gastrointestinal: Negative for abdominal distention, abdominal pain, constipation, diarrhea, nausea and vomiting.  Endocrine: Positive for polydipsia and polyuria. Negative for polyphagia.  Genitourinary: Negative for dysuria, flank pain, hematuria and urgency.  Musculoskeletal: Negative for back pain, gait problem, myalgias and neck pain.  Skin: Negative for pallor, rash and wound.  Neurological: Negative for seizures, syncope, speech difficulty, weakness, numbness and headaches.  Psychiatric/Behavioral: Negative for confusion and dysphoric mood.    Objective:    BP 107/75   Pulse 85   Ht $R'5\' 8"'pl$  (1.727 m)   Wt 166 lb (75.3 kg)   BMI 25.24 kg/m   Wt Readings from Last 3 Encounters:  12/16/19 166 lb (75.3 kg)  09/29/19 163 lb 6.4 oz (74.1 kg)  05/10/19 164 lb 12.8 oz (74.8 kg)    Physical Exam Constitutional:      General: He is not in acute distress.    Appearance: He is well-developed.  HENT:     Head: Normocephalic and atraumatic.  Neck:     Thyroid: No thyromegaly.     Trachea: No tracheal deviation.  Cardiovascular:     Rate and Rhythm: Normal rate.     Pulses:           Dorsalis pedis pulses are 1+ on the right side and 1+ on the left side.       Posterior tibial pulses are 1+ on the right side and 1+ on the left side.     Heart sounds: S1 normal and S2 normal. No murmur heard.  No gallop.   Pulmonary:     Effort: Pulmonary effort is normal. No respiratory distress.     Breath sounds: No wheezing.  Abdominal:     General: There is no distension.     Tenderness: There  is no abdominal tenderness. There is no guarding.  Musculoskeletal:     Right shoulder: No swelling or deformity.     Cervical back: Normal range of motion and neck supple.  Skin:    General: Skin is warm and dry.     Findings: No rash.     Nails: There is no clubbing.  Neurological:     Mental Status: He is alert and oriented to person, place, and time.     Cranial Nerves: No cranial nerve deficit.     Sensory: No sensory deficit.     Gait: Gait normal.  Psychiatric:        Speech: Speech normal.        Behavior: Behavior is cooperative.     Comments:  passive and unconcerned affect.     Results for orders placed or performed in visit on 09/26/19  CUP PACEART REMOTE DEVICE CHECK  Result Value Ref Range   Date Time Interrogation Session [id number]    Pulse Generator Manufacturer MERM    Pulse Gen Model [id number] Reveal LINQ    Pulse Gen Serial Number [id number] [facility] Name [place] Pulse Generator Type ICM/ILR    Implantable Pulse Generator Implant Date [id number]    Battery Status RRT    Diabetic Labs (most recent): Lab Results  Component Value Date   HGBA1C 8.5 (H) 05/02/2019   HGBA1C 8.4 (H) 09/02/2018   HGBA1C 9.3 (H) 03/04/2018   Lipid Panel     Component Value Date/Time   CHOL 147 09/02/2018 0735   TRIG 198 (H) 09/02/2018 0735   HDL 29 (L) 09/02/2018 0735   CHOLHDL 5.1 (H) 09/02/2018 0735   VLDL 16 03/04/2018 0500   LDLCALC 88 09/02/2018 0735      Assessment & Plan:   1. Type 2 diabetes mellitus with vascular disease (HCC) -His  diabetes is  complicated by coronary artery disease and patient remains at a high risk for more acute and chronic complications of diabetes which include CAD, CVA, CKD, retinopathy, and neuropathy. These are all discussed in detail with the patient.  --He has a habit of missing his appointments and not following recommendations strictly.  That made him lose coverage for his supplies. He missed his appointment since April 2021.  He presents with no logs nor meter.  He did not bring his CGM device either.  He says he ran out of NovoLog, currently using only Lantus 50 units nightly.  His point-of-care A1c 9.1%, increasing from 7.3%.    - I have re-counseled the patient on diet management  by adopting a carbohydrate restricted / protein rich  Diet.  - he  admits there is a room for improvement in his diet and drink choices. -  Suggestion is made for him to avoid simple carbohydrates  from his diet including Cakes, Sweet Desserts / Pastries, Ice Cream, Soda (diet and regular), Sweet Tea, Candies, Chips, Cookies, Sweet Pastries,  Store Bought Juices, Alcohol in Excess of  1-2 drinks a day, Artificial Sweeteners, Coffee Creamer, and "Sugar-free" Products. This will help patient to have stable blood glucose profile and potentially avoid unintended weight gain.   - Patient is advised to stick to a routine mealtimes to eat 3 meals  a day and avoid unnecessary snacks ( to snack only to correct hypoglycemia).  - I have approached patient with the following individualized plan to manage diabetes and patient agrees.  - Proper use of insulin is the  exclusive choice he has to treat his diabetes. - He is given refills on his supplies, advised to resume to monitor his continuous glucose monitoring device at all times.    -He is advised to lower her Lantus to 40 units nightly, start NovoLog 10  units 3 times daily before meals for pre-meal blood glucose above 90 mg/dL. - He will use an additional correction dose  of NovoLog for blood glucose readings above 150 mg/dL. -He is encouraged to call clinic for blood glucose levels less than 70 or above 200 mg /dl.  He is urged to return in 10 days with his meter and logs for reevaluation and get a long-term plan for management. - He is not the right candidate for Invokana nor incretintherapy.  -Target numbers for A1c, LDL, HDL, Triglycerides,  were discussed in detail.   2) BP/HTN:  His blood pressure is controlled to target.  He is advised to continue his current medications including beta blockers.  He has marginal blood pressure, will not tolerate additional ACE inhibitors.   3) Lipids/HPL: His lipid panel shows still significantly above target LDL at 116.  -He is advised to be consistent in his atorvastatin 40 mg p.o. nightly.  Side effects and precautions discussed with him.      4)  Weight/Diet:   His BMI is 25.2 .  He is not a candidate for weight loss.  He is  following with  CDE consult, exercise, and carbohydrates information provided.    5) Chronic Care/Health Maintenance:  -Patient is  Statin medications and encouraged to continue to follow up with Ophthalmology, Podiatrist at least yearly or according to recommendations, and advised to  quit Smoking (tragically he resumed smoking after he quit briefly during his diagnosis of CVA ). I have recommended yearly flu vaccine and pneumonia vaccination at least every 5 years; moderate intensity exercise for up to 150 minutes weekly; and  sleep for at least 7 hours a day.  The patient was counseled on the dangers of tobacco use, and was advised to quit.  Reviewed strategies to maximize success, including removing cigarettes and smoking materials from environment.   POCT ABI Results 12/16/19   Right ABI: 1.11      left ABI: 1.11  Right leg systolic / diastolic: 846/65 mmHg Left leg systolic / diastolic: 993/57 mmHg  Arm systolic / diastolic: 017/79 mmHG This ABI is normal.  It would be repeated in  5 years or sooner if needed. I advised patient to maintain close follow up with his PCP for primary care needs.  - Time spent on this patient care encounter:  35 min, of which > 50% was spent in  counseling and the rest reviewing his blood glucose logs , discussing his hypoglycemia and hyperglycemia episodes, reviewing his current and  previous labs / studies  ( including abstraction from other facilities) and medications  doses and developing a  long term treatment plan and documenting his care.   Please refer to Patient Instructions for Blood Glucose Monitoring and Insulin/Medications Dosing Guide"  in media tab for additional information. Please  also refer to " Patient Self Inventory" in the Media  tab for reviewed elements of pertinent patient history.  [name] participated in the discussions, expressed understanding, and voiced agreement with the above plans.  All questions were answered to his satisfaction. he is encouraged to contact clinic should he have any questions or concerns prior to his return visit.    Follow up plan:  Return in about 10 days (around 12/26/2019) for F/U with Meter and Logs Only - no Labs.  Glade Lloyd, MD Phone: [phone]  Fax: [phone]  This note was partially dictated with voice recognition software. Similar sounding words can be transcribed inadequately or may not  be corrected upon review.  12/16/2019, 9:00 AM

## 2019-12-16 NOTE — Patient Instructions (Signed)

## 2019-12-26 ENCOUNTER — Ambulatory Visit (INDEPENDENT_AMBULATORY_CARE_PROVIDER_SITE_OTHER): Payer: BC Managed Care – PPO | Admitting: "Endocrinology

## 2019-12-26 ENCOUNTER — Encounter: Payer: Self-pay | Admitting: "Endocrinology

## 2019-12-26 ENCOUNTER — Other Ambulatory Visit: Payer: Self-pay

## 2019-12-26 VITALS — BP 108/62 | HR 93 | Temp 97.2°F | Resp 18 | Ht 69.0 in

## 2019-12-26 DIAGNOSIS — E1159 Type 2 diabetes mellitus with other circulatory complications: Secondary | ICD-10-CM | POA: Diagnosis not present

## 2019-12-26 DIAGNOSIS — E782 Mixed hyperlipidemia: Secondary | ICD-10-CM | POA: Diagnosis not present

## 2019-12-26 DIAGNOSIS — I10 Essential (primary) hypertension: Secondary | ICD-10-CM | POA: Diagnosis not present

## 2019-12-26 NOTE — Progress Notes (Signed)
12/26/2019  Endocrinology follow-up note  Subjective:    Patient ID: Nicholas [name], male    DOB: 1960/12/13,    Past Medical History:  Diagnosis Date  . Anal lesion    Anal papilla/tags - external lesion  . Bilateral carpal tunnel syndrome 11/24/2018  . Colon polyp    Hyperplastic rectosigmoid polyp 9/08  . Coronary atherosclerosis of native coronary artery    DES to PLB, BMS to mid and distal RCA, DES to circumflex and distal LAD, 6/10 (5 stents)  . Diabetic peripheral neuropathy ([place]) 11/24/2018  . GERD (gastroesophageal reflux disease)   . Helicobacter pylori (H. pylori) 09/2009   Treated with helidac  . Hiatal hernia   . Hyperplastic colon polyp 2008  . Mixed hyperlipidemia   . Myocardial infarction ([place]) 06/2008  . Stroke [facility])    January 2018  . Type 2 diabetes mellitus ([place])    Past Surgical History:  Procedure Laterality Date  . CHOLECYSTECTOMY    . COLLAPSED LUNG     15 years ago  . COLONOSCOPY  10/12/2006   Dr. Gala Romney- rectosigmoid polyp s/phot snare polypectomy o/w normal rectum and terminal ileum. hyperplastic polyp on bx  . COLONOSCOPY WITH PROPOFOL N/A 09/01/2019   Procedure: COLONOSCOPY WITH PROPOFOL;  Surgeon: Daneil Dolin, MD;  Location: [facility];  Service: Endoscopy;  Laterality: N/A;  8:30am  . EP IMPLANTABLE DEVICE N/A 02/12/2016   Procedure: Loop Recorder Insertion;  Surgeon: Thompson Grayer, MD;  Location: [facility] LAB;  Service: Cardiovascular;  Laterality: N/A;  . ESOPHAGOGASTRODUODENOSCOPY  10/12/2006   Dr. Gala Romney- examination of the tubular esophagus revealed no mucosal abnormalities. the EG junction was easily traversed. small hiatal hernia, the gastric mucosa o/w appeared normal. there was no infiltrating process or frank ulcer seen.  . implantable loop recorder removal  09/29/2019   MDT Reveal LINQ removed in office by Dr Rayann Heman  . TEE WITHOUT CARDIOVERSION N/A 02/12/2016   Procedure: TRANSESOPHAGEAL ECHOCARDIOGRAM (TEE);  Surgeon: Jerline Pain, MD;  Location[facility] ENDOSCOPY;  Service: Cardiovascular;  Laterality: N/A;  . WIRE IN APEX OF RIGHT LUNG     Since childhood   Social History   Socioeconomic History  . Marital status: Married    Spouse name: Not on file  . Number of children: Not on file  . Years of education: Not on file  . Highest education level: Not on file  Occupational History  . Occupation: maintenance, parking lots    Employer: SELF-EMPLOYED  Tobacco Use  . Smoking status: Current Every Day Smoker    Packs/day: 0.50    Years: 34.00    Pack years: 17.00    Types: Cigarettes    Start date: 04/08/1973  . Smokeless tobacco: Never Used  Vaping Use  . Vaping Use: Never used  Substance and Sexual Activity  . Alcohol use: No    Alcohol/week: 0.0 standard drinks  . Drug use: No  . Sexual activity: Yes    Partners: Female    Birth control/protection: Pill  Other Topics Concern  . Not on file  Social History Narrative   Drinks Coffee 2 cups daily.  Lives sat home with wife.   Is self employed.  Graduated from High school.     Social Determinants of Health   Financial Resource Strain:   . Difficulty of Paying Living Expenses: Not on file  Food Insecurity:   . Worried About Charity fundraiser in the Last Year: Not on file  .  Ran Out of Food in the Last Year: Not on file  Transportation Needs:   . Lack of Transportation (Medical): Not on file  . Lack of Transportation (Non-Medical): Not on file  Physical Activity:   . Days of Exercise per Week: Not on file  . Minutes of Exercise per Session: Not on file  Stress:   . Feeling of Stress : Not on file  Social Connections:   . Frequency of Communication with Friends and Family: Not on file  . Frequency of Social Gatherings with Friends and Family: Not on file  . Attends Religious Services: Not on file  . Active Member of Clubs or Organizations: Not on file  . Attends Archivist Meetings: Not on file  . Marital Status: Not on file    Outpatient Encounter Medications as of 12/26/2019  Medication Sig  . albuterol (VENTOLIN HFA) 108 (90 Base) MCG/ACT inhaler Inhale 1-2 puffs into the lungs every 6 (six) hours as needed for wheezing or shortness of breath.  Marland Kitchen aspirin EC 325 MG tablet Take 325 mg by mouth daily.  . BD PEN NEEDLE NANO U/F 32G X 4 MM MISC USE AS DIRECTED FOUR TIMES DAILY.  Marland Kitchen clopidogrel (PLAVIX) 75 MG tablet Take 1 tablet by mouth once daily  . Continuous Blood Gluc Receiver (FREESTYLE LIBRE 2 READER) DEVI As directed  . Continuous Blood Gluc Sensor (FREESTYLE LIBRE 2 SENSOR) MISC 1 Piece by Does not apply route every 14 (fourteen) days.  Marland Kitchen gabapentin (NEURONTIN) 100 MG capsule Take 100 mg by mouth daily.   . Insulin Aspart (NOVOLOG FLEXPEN [employer]) Inject 5-8 Units into the skin 3 (three) times daily before meals.  . insulin glargine (LANTUS SOLOSTAR) 100 UNIT/ML Solostar Pen Inject 40 Units into the skin daily.  . nitroGLYCERIN (NITROSTAT) 0.4 MG SL tablet DISSOLVE 1 TABLET UNDER TONGUE EVERY 5 MINUTES UP TO 15 MIN FOR CHESTPAIN. IF NO RELIEF CALL 911.  . pantoprazole (PROTONIX) 40 MG tablet Take 40 mg by mouth daily.   Manus Gunning BOWEL PREP KIT 17.5-3.13-1.6 GM/177ML SOLN Take 354 mLs by mouth once.  . traMADol (ULTRAM) 50 MG tablet Take 1 tablet (50 mg total) by mouth every 6 (six) hours as needed for moderate pain.   No facility-administered encounter medications on file as of 12/26/2019.   ALLERGIES: No Known Allergies VACCINATION STATUS:  There is no immunization history on file for this patient.  Diabetes He presents for his follow-up diabetic visit. He has type 2 diabetes mellitus. Onset time: He was diagnosed at approximate age of 21 years. In this particular patient with a history of heavy alcohol use possiblity of pancreatic diabetes is high. His disease course has been improving. Pertinent negatives for hypoglycemia include no confusion, headaches, pallor, seizures or speech difficulty. Pertinent  negatives for diabetes include no chest pain, no fatigue, no polydipsia, no polyphagia, no polyuria and no weakness. There are no hypoglycemic complications. Symptoms are improving. Diabetic complications include a CVA and heart disease. Risk factors for coronary artery disease include diabetes mellitus, dyslipidemia, hypertension, male sex, sedentary lifestyle and tobacco exposure. Current diabetic treatment includes oral agent (dual therapy). He is compliant with treatment some of the time. His weight is increasing steadily. He is following a generally unhealthy diet. When asked about meal planning, he reported none. He has not had a previous visit with a dietitian (he did not keep appointment.). He rarely participates in exercise. His home blood glucose trend is decreasing steadily. His breakfast blood glucose range  is generally 140-180 mg/dl. His lunch blood glucose range is generally 180-200 mg/dl. His dinner blood glucose range is generally 180-200 mg/dl. His bedtime blood glucose range is generally 180-200 mg/dl. His overall blood glucose range is 180-200 mg/dl. (He returns with improved glycemic profile, averaging 171 over the last 7 days.  He has still above target postprandial glycemic profile, tightly controlled fasting blood glucose profile.  For unclear reasons he kept his Lantus at 50 units, did not use any NovoLog.  His recent A1c was 9.1%.) Eye exam is current.  Hyperlipidemia This is a chronic problem. The current episode started more than 1 year ago. Exacerbating diseases include diabetes. Pertinent negatives include no chest pain, myalgias or shortness of breath. Current antihyperlipidemic treatment includes statins. Risk factors for coronary artery disease include dyslipidemia, diabetes mellitus, hypertension and a sedentary lifestyle.  Hypertension This is a chronic problem. The current episode started more than 1 year ago. The problem is controlled. Pertinent negatives include no chest  pain, headaches, neck pain, palpitations or shortness of breath. Risk factors for coronary artery disease include diabetes mellitus, dyslipidemia and smoking/tobacco exposure. Past treatments include beta blockers. Hypertensive end-organ damage includes CAD/MI and CVA.    Review of Systems  Constitutional: Negative for fatigue and unexpected weight change.  HENT: Negative for dental problem, mouth sores and trouble swallowing.   Eyes: Negative for visual disturbance.  Respiratory: Negative for cough, choking, chest tightness, shortness of breath and wheezing.   Cardiovascular: Negative for chest pain, palpitations and leg swelling.  Gastrointestinal: Negative for abdominal distention, abdominal pain, constipation, diarrhea, nausea and vomiting.  Endocrine: Negative for polydipsia, polyphagia and polyuria.  Genitourinary: Negative for dysuria, flank pain, hematuria and urgency.  Musculoskeletal: Negative for back pain, gait problem, myalgias and neck pain.  Skin: Negative for pallor, rash and wound.  Neurological: Negative for seizures, syncope, speech difficulty, weakness, numbness and headaches.  Psychiatric/Behavioral: Negative for confusion and dysphoric mood.    Objective:    BP 108/62 (BP Location: Right Arm, Patient Position: Sitting, Cuff Size: Normal)   Pulse 93   Temp (!) 97.2 F (36.2 C) (Temporal)   Resp 18   Ht _0  (1.753 m)   SpO2 97%   BMI 24.51 kg/m   Wt Readings from Last 3 Encounters:  12/16/19 166 lb (75.3 kg)  09/29/19 163 lb 6.4 oz (74.1 kg)  05/10/19 164 lb 12.8 oz (74.8 kg)    Physical Exam Constitutional:      General: He is not in acute distress.    Appearance: He is well-developed.  HENT:     Head: Normocephalic and atraumatic.  Neck:     Thyroid: No thyromegaly.     Trachea: No tracheal deviation.  Cardiovascular:     Rate and Rhythm: Normal rate.     Pulses:          Dorsalis pedis pulses are 1+ on the right side and 1+ on the left side.        Posterior tibial pulses are 1+ on the right side and 1+ on the left side.     Heart sounds: S1 normal and S2 normal. No murmur heard.  No gallop.   Pulmonary:     Effort: Pulmonary effort is normal. No respiratory distress.     Breath sounds: No wheezing.  Abdominal:     General: There is no distension.     Tenderness: There is no abdominal tenderness. There is no guarding.  Musculoskeletal:     Right  shoulder: No swelling or deformity.     Cervical back: Normal range of motion and neck supple.  Skin:    General: Skin is warm and dry.     Findings: No rash.     Nails: There is no clubbing.  Neurological:     Mental Status: He is alert and oriented to person, place, and time.     Cranial Nerves: No cranial nerve deficit.     Sensory: No sensory deficit.     Gait: Gait normal.  Psychiatric:        Speech: Speech normal.        Behavior: Behavior is cooperative.     Comments:  passive and unconcerned affect.     Results for orders placed or performed in visit on 12/16/19  Hemoglobin A1c  Result Value Ref Range   Hemoglobin A1C 9.1    Diabetic Labs (most recent): Lab Results  Component Value Date   HGBA1C 9.1 12/16/2019   HGBA1C 8.5 (H) 05/02/2019   HGBA1C 8.4 (H) 09/02/2018   Lipid Panel     Component Value Date/Time   CHOL 147 09/02/2018 0735   TRIG 198 (H) 09/02/2018 0735   HDL 29 (L) 09/02/2018 0735   CHOLHDL 5.1 (H) 09/02/2018 0735   VLDL 16 03/04/2018 0500   LDLCALC 88 09/02/2018 0735      Assessment & Plan:   1. Type 2 diabetes mellitus with vascular disease (HCC) -His diabetes is  complicated by coronary artery disease and patient remains at a high risk for more acute and chronic complications of diabetes which include CAD, CVA, CKD, retinopathy, and neuropathy. These are all discussed in detail with the patient.  He returns with improved glycemic profile, averaging 171 over the last 7 days.  He has still above target postprandial glycemic profile,  tightly controlled fasting blood glucose profile.  For unclear reasons he kept his Lantus at 50 units, did not use any NovoLog.  His recent A1c was 9.1%.  - I have re-counseled the patient on diet management  by adopting a carbohydrate restricted / protein rich  Diet.  - he  admits there is a room for improvement in his diet and drink choices. -  Suggestion is made for him to avoid simple carbohydrates  from his diet including Cakes, Sweet Desserts / Pastries, Ice Cream, Soda (diet and regular), Sweet Tea, Candies, Chips, Cookies, Sweet Pastries,  Store Bought Juices, Alcohol in Excess of  1-2 drinks a day, Artificial Sweeteners, Coffee Creamer, and "Sugar-free" Products. This will help patient to have stable blood glucose profile and potentially avoid unintended weight gain.   - Patient is advised to stick to a routine mealtimes to eat 3 meals  a day and avoid unnecessary snacks ( to snack only to correct hypoglycemia).  - I have approached patient with the following individualized plan to manage diabetes and patient agrees.  - Proper use of insulin is the  exclusive choice he has to treat his diabetes. - He is given refills on his supplies, advised to resume to monitor his continuous glucose monitoring device at all times.    -He is advised to lower his Lantus to 40 units nightly, restart NovoLog at 5  units 3 times daily before meals for pre-meal blood glucose above 90 mg/dL. - He will use an additional correction dose of NovoLog for blood glucose readings above 150 mg/dL. -He is encouraged to call clinic for blood glucose levels less than 70 or above 200 mg /dl.   -  He is not the right candidate for Invokana nor incretintherapy.  -Target numbers for A1c, LDL, HDL, Triglycerides,  were discussed in detail.   2) BP/HTN:  His blood pressure is controlled to target.  He is advised to continue his current medications including beta blockers.  He has marginal blood pressure, will not tolerate  additional ACE inhibitors.   3) Lipids/HPL: His lipid panel shows still significantly above target LDL at 116.  -He is advised to be consistent in his atorvastatin 40 mg p.o. nightly.   Side effects and precautions discussed with him.      4)  Weight/Diet:   His BMI is 25.2 .  He is not a candidate for weight loss.  He is  following with  CDE consult, exercise, and carbohydrates information provided.    5) Chronic Care/Health Maintenance:  -Patient is  Statin medications and encouraged to continue to follow up with Ophthalmology, Podiatrist at least yearly or according to recommendations, and advised to  quit Smoking (tragically he resumed smoking after he quit briefly during his diagnosis of CVA ). I have recommended yearly flu vaccine and pneumonia vaccination at least every 5 years; moderate intensity exercise for up to 150 minutes weekly; and  sleep for at least 7 hours a day.  The patient was counseled on the dangers of tobacco use, and was advised to quit.  Reviewed strategies to maximize success, including removing cigarettes and smoking materials from environment.  He recently had normal ABI, will be repeated in November 2026,  or sooner if needed. I advised patient to maintain close follow up with his PCP for primary care needs.  - Time spent on this patient care encounter:  35 min, of which > 50% was spent in  counseling and the rest reviewing his blood glucose logs , discussing his hypoglycemia and hyperglycemia episodes, reviewing his current and  previous labs / studies  ( including abstraction from other facilities) and medications  doses and developing a  long term treatment plan and documenting his care.   Please refer to Patient Instructions for Blood Glucose Monitoring and Insulin/Medications Dosing Guide"  in media tab for additional information. Please  also refer to " Patient Self Inventory" in the Media  tab for reviewed elements of pertinent patient history.  [name] participated in the discussions, expressed understanding, and voiced agreement with the above plans.  All questions were answered to his satisfaction. he is encouraged to contact clinic should he have any questions or concerns prior to his return visit.     Follow up plan: Return in about 3 months (around 03/26/2020) for Bring Meter and Logs- A1c in Office.  Glade Lloyd, MD Phone: [phone]  Fax: [phone]  This note was partially dictated with voice recognition software. Similar sounding words can be transcribed inadequately or may not  be corrected upon review.  12/26/2019, 11:22 AM

## 2020-02-02 LAB — HM DIABETES EYE EXAM

## 2020-03-26 ENCOUNTER — Ambulatory Visit: Payer: BC Managed Care – PPO | Admitting: "Endocrinology

## 2020-04-06 ENCOUNTER — Other Ambulatory Visit (HOSPITAL_COMMUNITY): Payer: Self-pay

## 2020-04-06 DIAGNOSIS — Z122 Encounter for screening for malignant neoplasm of respiratory organs: Secondary | ICD-10-CM

## 2020-04-06 DIAGNOSIS — Z87891 Personal history of nicotine dependence: Secondary | ICD-10-CM

## 2020-04-06 NOTE — Progress Notes (Signed)
Call placed to patient regarding follow-up LDCT. I spoke with patient who is interested in continuing with follow-up LDCT but requests that I call him back at a later date to discuss further.

## 2020-04-16 ENCOUNTER — Encounter (HOSPITAL_COMMUNITY): Payer: Self-pay

## 2020-04-16 NOTE — Progress Notes (Signed)
I have attempted to reach the patient regarding LDCT. Unable to reach the patient at this time. Detailed VM left asking the patient to return my call.

## 2020-04-26 ENCOUNTER — Ambulatory Visit: Payer: BC Managed Care – PPO | Admitting: "Endocrinology

## 2020-04-30 ENCOUNTER — Ambulatory Visit: Payer: BC Managed Care – PPO | Admitting: "Endocrinology

## 2020-04-30 ENCOUNTER — Other Ambulatory Visit: Payer: Self-pay

## 2020-04-30 ENCOUNTER — Encounter: Payer: Self-pay | Admitting: "Endocrinology

## 2020-04-30 VITALS — BP 112/58 | HR 80 | Ht 69.0 in | Wt 166.0 lb

## 2020-04-30 DIAGNOSIS — E1159 Type 2 diabetes mellitus with other circulatory complications: Secondary | ICD-10-CM | POA: Diagnosis not present

## 2020-04-30 DIAGNOSIS — Z9119 Patient's noncompliance with other medical treatment and regimen: Secondary | ICD-10-CM

## 2020-04-30 DIAGNOSIS — E782 Mixed hyperlipidemia: Secondary | ICD-10-CM | POA: Diagnosis not present

## 2020-04-30 DIAGNOSIS — Z91199 Patient's noncompliance with other medical treatment and regimen due to unspecified reason: Secondary | ICD-10-CM

## 2020-04-30 DIAGNOSIS — I10 Essential (primary) hypertension: Secondary | ICD-10-CM

## 2020-04-30 LAB — POCT GLYCOSYLATED HEMOGLOBIN (HGB A1C): HbA1c, POC (controlled diabetic range): 7.9 % — AB (ref 0.0–7.0)

## 2020-04-30 NOTE — Progress Notes (Signed)
04/30/2020  Endocrinology follow-up note  Subjective:    Patient ID: Nicholas [name], male    DOB: August 11, 1960,    Past Medical History:  Diagnosis Date  . Anal lesion    Anal papilla/tags - external lesion  . Bilateral carpal tunnel syndrome 11/24/2018  . Colon polyp    Hyperplastic rectosigmoid polyp 9/08  . Coronary atherosclerosis of native coronary artery    DES to PLB, BMS to mid and distal RCA, DES to circumflex and distal LAD, 6/10 (5 stents)  . Diabetic peripheral neuropathy ([place]) 11/24/2018  . GERD (gastroesophageal reflux disease)   . Helicobacter pylori (H. pylori) 09/2009   Treated with helidac  . Hiatal hernia   . Hyperplastic colon polyp 2008  . Mixed hyperlipidemia   . Myocardial infarction ([place]) 06/2008  . Stroke [facility])    January 2018  . Type 2 diabetes mellitus ([place])    Past Surgical History:  Procedure Laterality Date  . CHOLECYSTECTOMY    . COLLAPSED LUNG     15 years ago  . COLONOSCOPY  10/12/2006   Dr. Gala Romney- rectosigmoid polyp s/phot snare polypectomy o/w normal rectum and terminal ileum. hyperplastic polyp on bx  . COLONOSCOPY WITH PROPOFOL N/A 09/01/2019   Procedure: COLONOSCOPY WITH PROPOFOL;  Surgeon: Daneil Dolin, MD;  Location: [facility];  Service: Endoscopy;  Laterality: N/A;  8:30am  . EP IMPLANTABLE DEVICE N/A 02/12/2016   Procedure: Loop Recorder Insertion;  Surgeon: Thompson Grayer, MD;  Location: [place] [facility];  Service: Cardiovascular;  Laterality: N/A;  . ESOPHAGOGASTRODUODENOSCOPY  10/12/2006   Dr. Gala Romney- examination of the tubular esophagus revealed no mucosal abnormalities. the EG junction was easily traversed. small hiatal hernia, the gastric mucosa o/w appeared normal. there was no infiltrating process or frank ulcer seen.  . implantable loop recorder removal  09/29/2019   MDT Reveal LINQ removed in office by Dr Rayann Heman  . TEE WITHOUT CARDIOVERSION N/A 02/12/2016   Procedure: TRANSESOPHAGEAL ECHOCARDIOGRAM (TEE);  Surgeon: Jerline Pain, MD;  Location: [facility] ENDOSCOPY;  Service: Cardiovascular;  Laterality: N/A;  . WIRE IN APEX OF RIGHT LUNG     Since childhood   Social History   Socioeconomic History  . Marital status: Married    Spouse name: Not on file  . Number of children: Not on file  . Years of education: Not on file  . Highest education level: Not on file  Occupational History  . Occupation: maintenance, parking lots    Employer: SELF-EMPLOYED  Tobacco Use  . Smoking status: Current Every Day Smoker    Packs/day: 0.50    Years: 34.00    Pack years: 17.00    Types: Cigarettes    Start date: 04/08/1973  . Smokeless tobacco: Never Used  Vaping Use  . Vaping Use: Never used  Substance and Sexual Activity  . Alcohol use: No    Alcohol/week: 0.0 standard drinks  . Drug use: No  . Sexual activity: Yes    Partners: Female    Birth control/protection: Pill  Other Topics Concern  . Not on file  Social History Narrative   Drinks Coffee 2 cups daily.  Lives sat home with wife.   Is self employed.  Graduated from High school.     Social Determinants of Health   Financial Resource Strain: Not on file  Food Insecurity: Not on file  Transportation Needs: Not on file  Physical Activity: Not on file  Stress: Not on file  Social Connections: Not on  file   Outpatient Encounter Medications as of 04/30/2020  Medication Sig  . albuterol (VENTOLIN HFA) 108 (90 Base) MCG/ACT inhaler Inhale 1-2 puffs into the lungs every 6 (six) hours as needed for wheezing or shortness of breath.  Marland Kitchen aspirin EC 325 MG tablet Take 325 mg by mouth daily.  . BD PEN NEEDLE NANO U/F 32G X 4 MM MISC USE AS DIRECTED FOUR TIMES DAILY.  Marland Kitchen clopidogrel (PLAVIX) 75 MG tablet Take 1 tablet by mouth once daily  . Continuous Blood Gluc Receiver (FREESTYLE LIBRE 2 READER) DEVI As directed  . Continuous Blood Gluc Sensor (FREESTYLE LIBRE 2 SENSOR) MISC 1 Piece by Does not apply route every 14 (fourteen) days.  Marland Kitchen gabapentin (NEURONTIN) 100 MG  capsule Take 100 mg by mouth daily.   . insulin glargine (LANTUS SOLOSTAR) 100 UNIT/ML Solostar Pen Inject 40 Units into the skin daily.  . nitroGLYCERIN (NITROSTAT) 0.4 MG SL tablet DISSOLVE 1 TABLET UNDER TONGUE EVERY 5 MINUTES UP TO 15 MIN FOR CHESTPAIN. IF NO RELIEF CALL 911.  . pantoprazole (PROTONIX) 40 MG tablet Take 40 mg by mouth daily.   Manus Gunning BOWEL PREP KIT 17.5-3.13-1.6 GM/177ML SOLN Take 354 mLs by mouth once.  . traMADol (ULTRAM) 50 MG tablet Take 1 tablet (50 mg total) by mouth every 6 (six) hours as needed for moderate pain.  . [DISCONTINUED] Insulin Aspart (NOVOLOG FLEXPEN [place]) Inject 5-8 Units into the skin 3 (three) times daily before meals.   No facility-administered encounter medications on file as of 04/30/2020.   ALLERGIES: No Known Allergies VACCINATION STATUS:  There is no immunization history on file for this patient.  Diabetes He presents for his follow-up diabetic visit. He has type 2 diabetes mellitus. Onset time: He was diagnosed at approximate age of 36 years. In this particular patient with a history of heavy alcohol use possiblity of pancreatic diabetes is high. His disease course has been improving. Pertinent negatives for hypoglycemia include no confusion, headaches, pallor, seizures or speech difficulty. Pertinent negatives for diabetes include no chest pain, no fatigue, no polydipsia, no polyphagia, no polyuria and no weakness. There are no hypoglycemic complications. Symptoms are improving. Diabetic complications include a CVA and heart disease. Risk factors for coronary artery disease include diabetes mellitus, dyslipidemia, hypertension, male sex, sedentary lifestyle and tobacco exposure. Current diabetic treatment includes oral agent (dual therapy). He is compliant with treatment some of the time. His weight is fluctuating minimally. He is following a generally unhealthy diet. When asked about meal planning, he reported none. He has not had a previous visit  with a dietitian (he did not keep appointment.). He rarely participates in exercise. His home blood glucose trend is decreasing steadily. (He returns with his CGM device showing inadequate utilization scanning only once a day.  Patient says he only uses the Lantus and not NovoLog.  He has CGM analysis shows 53% in range, 47% above range.  No hypoglycemia.  His point-of-care A1c 7.9% improving from 9.1%.   ) Eye exam is current.  Hyperlipidemia This is a chronic problem. The current episode started more than 1 year ago. Exacerbating diseases include diabetes. Pertinent negatives include no chest pain, myalgias or shortness of breath. Current antihyperlipidemic treatment includes statins. Risk factors for coronary artery disease include dyslipidemia, diabetes mellitus, hypertension and a sedentary lifestyle.  Hypertension This is a chronic problem. The current episode started more than 1 year ago. The problem is controlled. Pertinent negatives include no chest pain, headaches, neck pain, palpitations or  shortness of breath. Risk factors for coronary artery disease include diabetes mellitus, dyslipidemia and smoking/tobacco exposure. Past treatments include beta blockers. Hypertensive end-organ damage includes CAD/MI and CVA.    Review of Systems  Constitutional: Negative for fatigue and unexpected weight change.  HENT: Negative for dental problem, mouth sores and trouble swallowing.   Eyes: Negative for visual disturbance.  Respiratory: Negative for cough, choking, chest tightness, shortness of breath and wheezing.   Cardiovascular: Negative for chest pain, palpitations and leg swelling.  Gastrointestinal: Negative for abdominal distention, abdominal pain, constipation, diarrhea, nausea and vomiting.  Endocrine: Negative for polydipsia, polyphagia and polyuria.  Genitourinary: Negative for dysuria, flank pain, hematuria and urgency.  Musculoskeletal: Negative for back pain, gait problem, myalgias and  neck pain.  Skin: Negative for pallor, rash and wound.  Neurological: Negative for seizures, syncope, speech difficulty, weakness, numbness and headaches.  Psychiatric/Behavioral: Negative for confusion and dysphoric mood.    Objective:    BP (!) 112/58   Pulse 80   Ht _0  (1.753 m)   Wt 166 lb (75.3 kg)   BMI 24.51 kg/m   Wt Readings from Last 3 Encounters:  04/30/20 166 lb (75.3 kg)  12/16/19 166 lb (75.3 kg)  09/29/19 163 lb 6.4 oz (74.1 kg)    Physical Exam Constitutional:      General: He is not in acute distress.    Appearance: He is well-developed.  HENT:     Head: Normocephalic and atraumatic.  Neck:     Thyroid: No thyromegaly.     Trachea: No tracheal deviation.  Cardiovascular:     Rate and Rhythm: Normal rate.     Pulses:          Dorsalis pedis pulses are 1+ on the right side and 1+ on the left side.       Posterior tibial pulses are 1+ on the right side and 1+ on the left side.     Heart sounds: S1 normal and S2 normal. No murmur heard. No gallop.   Pulmonary:     Effort: Pulmonary effort is normal. No respiratory distress.     Breath sounds: No wheezing.  Abdominal:     General: There is no distension.     Tenderness: There is no abdominal tenderness. There is no guarding.  Musculoskeletal:     Right shoulder: No swelling or deformity.     Cervical back: Normal range of motion and neck supple.  Skin:    General: Skin is warm and dry.     Findings: No rash.     Nails: There is no clubbing.  Neurological:     Mental Status: He is alert and oriented to person, place, and time.     Cranial Nerves: No cranial nerve deficit.     Sensory: No sensory deficit.     Gait: Gait normal.  Psychiatric:        Speech: Speech normal.        Behavior: Behavior is cooperative.     Comments:  passive and unconcerned affect.     Results for orders placed or performed in visit on 04/30/20  HgB A1c  Result Value Ref Range   Hemoglobin A1C     HbA1c POC (<>  result, manual entry)     HbA1c, POC (prediabetic range)     HbA1c, POC (controlled diabetic range) 7.9 (A) 0.0 - 7.0 %   Diabetic Labs (most recent): Lab Results  Component Value Date   HGBA1C 7.9 (A)  04/30/2020   HGBA1C 9.1 12/16/2019   HGBA1C 8.5 (H) 05/02/2019   Lipid Panel     Component Value Date/Time   CHOL 147 09/02/2018 0735   TRIG 198 (H) 09/02/2018 0735   HDL 29 (L) 09/02/2018 0735   CHOLHDL 5.1 (H) 09/02/2018 0735   VLDL 16 03/04/2018 0500   LDLCALC 88 09/02/2018 0735     Assessment & Plan:   1. Type 2 diabetes mellitus with vascular disease (HCC) -His diabetes is  complicated by coronary artery disease and patient remains at a high risk for more acute and chronic complications of diabetes which include CAD, CVA, CKD, retinopathy, and neuropathy. These are all discussed in detail with the patient.  He returns with his CGM device showing inadequate utilization scanning only once a day.  Patient says he only uses the Lantus and not NovoLog.  He has CGM analysis shows 53% in range, 47% above range.  No hypoglycemia.  His point-of-care A1c 7.9% improving from 9.1%.    - I have re-counseled the patient on diet management  by adopting a carbohydrate restricted / protein rich  Diet.  - he acknowledges that there is a room for improvement in his food and drink choices. - Suggestion is made for him to avoid simple carbohydrates  from his diet including Cakes, Sweet Desserts, Ice Cream, Soda (diet and regular), Sweet Tea, Candies, Chips, Cookies, Store Bought Juices, Alcohol in Excess of  1-2 drinks a day, Artificial Sweeteners,  Coffee Creamer, and "Sugar-free" Products, Lemonade. This will help patient to have more stable blood glucose profile and potentially avoid unintended weight gain.   - Patient is advised to stick to a routine mealtimes to eat 3 meals  a day and avoid unnecessary snacks ( to snack only to correct hypoglycemia).  - I have approached patient with the  following individualized plan to manage diabetes and patient agrees.  - Proper use of insulin is the  exclusive choice he has to treat his diabetes, however, patient did not commit for using NovoLog and would like to continue only with Lantus. -He is advised to continue Lantus 40 units nightly, discontinue NovoLog for now.  He is still urged to use his CGM device at least twice a day-daily before breakfast and at bedtime.    -He is encouraged to call clinic for blood glucose levels less than 70 or above 200 mg /dl.   -He will be reconsidered for NovoLog when he shows proper engagement and commitment for monitoring. - He is not the right candidate for Invokana nor incretintherapy.  -Target numbers for A1c, LDL, HDL, Triglycerides,  were discussed in detail.   2) BP/HTN:  -His blood pressure is controlled to target.  He is advised to continue his current medications including beta blockers.  He has marginal blood pressure, will not tolerate additional ACE inhibitors.   3) Lipids/HPL: His lipid panel shows still significantly above target LDL at 116.  -He is advised to be consistent in his atorvastatin 40 mg p.o. nightly.  Side effects and precautions discussed with him.      4)  Weight/Diet:   His BMI is 24.5.  He is not a candidate for weight loss.  He is  following with  CDE consult, exercise, and carbohydrates information provided.    5) Chronic Care/Health Maintenance:  -Patient is  Statin medications and encouraged to continue to follow up with Ophthalmology, Podiatrist at least yearly or according to recommendations, and advised to  quit Smoking (tragically  he resumed smoking after he quit briefly during his diagnosis of CVA ). I have recommended yearly flu vaccine and pneumonia vaccination at least every 5 years; moderate intensity exercise for up to 150 minutes weekly; and  sleep for at least 7 hours a day.  The patient was counseled on the dangers of tobacco use, and was advised to  quit.  Reviewed strategies to maximize success, including removing cigarettes and smoking materials from environment.  He recently had normal ABI, will be repeated in November 2026,  or sooner if needed. I advised patient to maintain close follow up with his PCP for primary care needs.   - Time spent on this patient care encounter:  40 min, of which > 50% was spent in  counseling and the rest reviewing his blood glucose logs , discussing his hypoglycemia and hyperglycemia episodes, reviewing his current and  previous labs / studies  ( including abstraction from other facilities) and medications  doses and developing a  long term treatment plan and documenting his care.   Please refer to Patient Instructions for Blood Glucose Monitoring and Insulin/Medications Dosing Guide"  in media tab for additional information. Please  also refer to " Patient Self Inventory" in the Media  tab for reviewed elements of pertinent patient history.  [name] participated in the discussions, expressed understanding, and voiced agreement with the above plans.  All questions were answered to his satisfaction. he is encouraged to contact clinic should he have any questions or concerns prior to his return visit.    Follow up plan: Return in about 18 weeks (around 09/03/2020) for F/U with Pre-visit Labs, Meter, Logs, A1c here.Glade Lloyd, MD Phone: [phone]  Fax: [phone]  This note was partially dictated with voice recognition software. Similar sounding words can be transcribed inadequately or may not  be corrected upon review.  04/30/2020, 10:57 AM

## 2020-04-30 NOTE — Patient Instructions (Signed)
                                     Advice for Weight Management  -For most of us the best way to lose weight is by diet management. Generally speaking, diet management means consuming less calories intentionally which over time brings about progressive weight loss.  This can be achieved more effectively by restricting carbohydrate consumption to the minimum possible.  So, it is critically important to know your numbers: how much calorie you are consuming and how much calorie you need. More importantly, our carbohydrates sources should be unprocessed or minimally processed complex starch food items.   Sometimes, it is important to balance nutrition by increasing protein intake (animal or plant source), fruits, and vegetables.  -Sticking to a routine mealtime to eat 3 meals a day and avoiding unnecessary snacks is shown to have a big role in weight control. Under normal circumstances, the only time we lose real weight is when we are hungry, so allow hunger to take place- hunger means no food between meal times, only water.  It is not advisable to starve.   -It is better to avoid simple carbohydrates including: Cakes, Sweet Desserts, Ice Cream, Soda (diet and regular), Sweet Tea, Candies, Chips, Cookies, Store Bought Juices, Alcohol in Excess of  1-2 drinks a day, Lemonade,  Artificial Sweeteners, Doughnuts, Coffee Creamers, "Sugar-free" Products, etc, etc.  This is not a complete list.....    -Consulting with certified diabetes educators is proven to provide you with the most accurate and current information on diet.  Also, you may be  interested in discussing diet options/exchanges , we can schedule a visit with Nicholas [name], RDN, CDE for individualized nutrition education.  -Exercise: If you are able: 30 -60 minutes a day ,4 days a week, or 150 minutes a week.  The longer the better.  Combine stretch, strength, and aerobic activities.  If you were told in the  past that you have high risk for cardiovascular diseases, you may seek evaluation by your heart doctor prior to initiating moderate to intense exercise programs.                                  Additional Care Considerations for Diabetes   -Diabetes  is a chronic disease.  The most important care consideration is regular follow-up with your diabetes care provider with the goal being avoiding or delaying its complications and to take advantage of advances in medications and technology.    -Type 2 diabetes is known to coexist with other important comorbidities such as high blood pressure and high cholesterol.  It is critical to control not only the diabetes but also the high blood pressure and high cholesterol to minimize and delay the risk of complications including coronary artery disease, stroke, amputations, blindness, etc.    - Studies showed that people with diabetes will benefit from a class of medications known as ACE inhibitors and statins.  Unless there are specific reasons not to be on these medications, the standard of care is to consider getting one from these groups of medications at an optimal doses.  These medications are generally considered safe and proven to help protect the heart and the kidneys.    - People with diabetes are encouraged to initiate and maintain regular follow-up with eye doctors, foot   doctors, dentists , and if necessary heart and kidney doctors.     - It is highly recommended that people with diabetes quit smoking or stay away from smoking, and get yearly  flu vaccine and pneumonia vaccine at least every 5 years.  One other important lifestyle recommendation is to ensure adequate sleep - at least 6-7 hours of uninterrupted sleep at night.  -Exercise: If you are able: 30 -60 minutes a day, 4 days a week, or 150 minutes a week.  The longer the better.  Combine stretch, strength, and aerobic activities.  If you were told in the past that you have high risk for  cardiovascular diseases, you may seek evaluation by your heart doctor prior to initiating moderate to intense exercise programs.          

## 2020-05-02 ENCOUNTER — Other Ambulatory Visit: Payer: Self-pay | Admitting: "Endocrinology

## 2020-05-07 ENCOUNTER — Encounter (HOSPITAL_COMMUNITY): Payer: Self-pay

## 2020-05-07 NOTE — Progress Notes (Signed)
Call placed to patient regarding follow-up LDCT. Unable to reach patient but detailed VM left asking the patient to return my call.

## 2020-05-08 ENCOUNTER — Other Ambulatory Visit: Payer: Self-pay

## 2020-05-08 ENCOUNTER — Telehealth: Payer: Self-pay | Admitting: "Endocrinology

## 2020-05-08 DIAGNOSIS — E1159 Type 2 diabetes mellitus with other circulatory complications: Secondary | ICD-10-CM

## 2020-05-08 MED ORDER — FREESTYLE LIBRE 2 SENSOR MISC: 1.0000 | 3 refills | Status: DC

## 2020-05-08 NOTE — Telephone Encounter (Signed)
Pt called and is needing his freestyle libre refilled sent to [facility] in [place]

## 2020-05-08 NOTE — Telephone Encounter (Signed)
done

## 2020-05-09 ENCOUNTER — Emergency Department (HOSPITAL_COMMUNITY): Payer: BC Managed Care – PPO

## 2020-05-09 ENCOUNTER — Encounter (HOSPITAL_COMMUNITY): Payer: Self-pay | Admitting: Emergency Medicine

## 2020-05-09 ENCOUNTER — Inpatient Hospital Stay (HOSPITAL_COMMUNITY): Payer: BC Managed Care – PPO

## 2020-05-09 ENCOUNTER — Inpatient Hospital Stay (HOSPITAL_COMMUNITY)
Admission: EM | Admit: 2020-05-09 | Discharge: 2020-05-12 | DRG: 065 | Disposition: A | Discharge status: Home / Self Care | Payer: BC Managed Care – PPO | Attending: Internal Medicine | Admitting: Internal Medicine

## 2020-05-09 DIAGNOSIS — Z8673 Personal history of transient ischemic attack (TIA), and cerebral infarction without residual deficits: Secondary | ICD-10-CM

## 2020-05-09 DIAGNOSIS — Z833 Family history of diabetes mellitus: Secondary | ICD-10-CM

## 2020-05-09 DIAGNOSIS — Z20822 Contact with and (suspected) exposure to covid-19: Secondary | ICD-10-CM | POA: Diagnosis present

## 2020-05-09 DIAGNOSIS — I6203 Nontraumatic chronic subdural hemorrhage: Secondary | ICD-10-CM | POA: Diagnosis present

## 2020-05-09 DIAGNOSIS — E1142 Type 2 diabetes mellitus with diabetic polyneuropathy: Secondary | ICD-10-CM | POA: Diagnosis present

## 2020-05-09 DIAGNOSIS — Z955 Presence of coronary angioplasty implant and graft: Secondary | ICD-10-CM | POA: Diagnosis not present

## 2020-05-09 DIAGNOSIS — Z8249 Family history of ischemic heart disease and other diseases of the circulatory system: Secondary | ICD-10-CM

## 2020-05-09 DIAGNOSIS — I252 Old myocardial infarction: Secondary | ICD-10-CM | POA: Diagnosis not present

## 2020-05-09 DIAGNOSIS — E1159 Type 2 diabetes mellitus with other circulatory complications: Secondary | ICD-10-CM | POA: Diagnosis present

## 2020-05-09 DIAGNOSIS — H53131 Sudden visual loss, right eye: Secondary | ICD-10-CM | POA: Diagnosis present

## 2020-05-09 DIAGNOSIS — I2510 Atherosclerotic heart disease of native coronary artery without angina pectoris: Secondary | ICD-10-CM | POA: Diagnosis present

## 2020-05-09 DIAGNOSIS — R4701 Aphasia: Secondary | ICD-10-CM

## 2020-05-09 DIAGNOSIS — Z79899 Other long term (current) drug therapy: Secondary | ICD-10-CM

## 2020-05-09 DIAGNOSIS — I10 Essential (primary) hypertension: Secondary | ICD-10-CM | POA: Diagnosis not present

## 2020-05-09 DIAGNOSIS — G40802 Other epilepsy, not intractable, without status epilepticus: Secondary | ICD-10-CM

## 2020-05-09 DIAGNOSIS — G5603 Carpal tunnel syndrome, bilateral upper limbs: Secondary | ICD-10-CM | POA: Diagnosis present

## 2020-05-09 DIAGNOSIS — Z7982 Long term (current) use of aspirin: Secondary | ICD-10-CM

## 2020-05-09 DIAGNOSIS — F17210 Nicotine dependence, cigarettes, uncomplicated: Secondary | ICD-10-CM | POA: Diagnosis present

## 2020-05-09 DIAGNOSIS — S065XAA Traumatic subdural hemorrhage with loss of consciousness status unknown, initial encounter: Secondary | ICD-10-CM | POA: Diagnosis present

## 2020-05-09 DIAGNOSIS — E785 Hyperlipidemia, unspecified: Secondary | ICD-10-CM | POA: Diagnosis present

## 2020-05-09 DIAGNOSIS — K219 Gastro-esophageal reflux disease without esophagitis: Secondary | ICD-10-CM | POA: Diagnosis present

## 2020-05-09 DIAGNOSIS — E782 Mixed hyperlipidemia: Secondary | ICD-10-CM | POA: Diagnosis present

## 2020-05-09 DIAGNOSIS — S065X9A Traumatic subdural hemorrhage with loss of consciousness of unspecified duration, initial encounter: Secondary | ICD-10-CM | POA: Diagnosis not present

## 2020-05-09 DIAGNOSIS — Z7902 Long term (current) use of antithrombotics/antiplatelets: Secondary | ICD-10-CM | POA: Diagnosis not present

## 2020-05-09 DIAGNOSIS — Z794 Long term (current) use of insulin: Secondary | ICD-10-CM | POA: Diagnosis not present

## 2020-05-09 LAB — RESP PANEL BY RT-PCR (FLU A&B, COVID) ARPGX2
Influenza A by PCR: NEGATIVE
Influenza B by PCR: NEGATIVE
SARS Coronavirus 2 by RT PCR: NEGATIVE

## 2020-05-09 LAB — COMPREHENSIVE METABOLIC PANEL
ALT: 21 U/L (ref 0–44)
AST: 20 U/L (ref 15–41)
Albumin: 4.1 g/dL (ref 3.5–5.0)
Alkaline Phosphatase: 60 U/L (ref 38–126)
Anion gap: 10 (ref 5–15)
BUN: 10 mg/dL (ref 8–23)
CO2: 24 mmol/L (ref 22–32)
Calcium: 9 mg/dL (ref 8.9–10.3)
Chloride: 102 mmol/L (ref 98–111)
Creatinine, Ser: 0.71 mg/dL (ref 0.61–1.24)
GFR, Estimated: >60 mL/min (ref >60)
Glucose, Bld: 194 mg/dL — ABNORMAL HIGH (ref 70–99)
Potassium: 4 mmol/L (ref 3.5–5.1)
Sodium: 136 mmol/L (ref 135–145)
Total Bilirubin: 0.7 mg/dL (ref 0.3–1.2)
Total Protein: 7.2 g/dL (ref 6.5–8.1)

## 2020-05-09 LAB — PROTIME-INR
INR: 1 (ref 0.8–1.2)
Prothrombin Time: 13.1 seconds (ref 11.4–15.2)

## 2020-05-09 LAB — RAPID URINE DRUG SCREEN, HOSP PERFORMED
Amphetamines: NOT DETECTED
Barbiturates: NOT DETECTED
Benzodiazepines: NOT DETECTED
Cocaine: NOT DETECTED
Opiates: NOT DETECTED
Tetrahydrocannabinol: NOT DETECTED

## 2020-05-09 LAB — APTT: aPTT: 28 seconds (ref 24–36)

## 2020-05-09 LAB — DIFFERENTIAL
Abs Immature Granulocytes: 0.04 10*3/uL (ref 0.00–0.07)
Basophils Absolute: 0.1 10*3/uL (ref 0.0–0.1)
Basophils Relative: 1 %
Eosinophils Absolute: 0.1 10*3/uL (ref 0.0–0.5)
Eosinophils Relative: 1 %
Immature Granulocytes: 0 %
Lymphocytes Relative: 19 %
Lymphs Abs: 2 10*3/uL (ref 0.7–4.0)
Monocytes Absolute: 0.7 10*3/uL (ref 0.1–1.0)
Monocytes Relative: 7 %
Neutro Abs: 7.6 10*3/uL (ref 1.7–7.7)
Neutrophils Relative %: 72 %

## 2020-05-09 LAB — CBC
HCT: 45.3 % (ref 39.0–52.0)
Hemoglobin: 14.9 g/dL (ref 13.0–17.0)
MCH: 27.5 pg (ref 26.0–34.0)
MCHC: 32.9 g/dL (ref 30.0–36.0)
MCV: 83.6 fL (ref 80.0–100.0)
Platelets: 183 10*3/uL (ref 150–400)
RBC: 5.42 MIL/uL (ref 4.22–5.81)
RDW: 14 % (ref 11.5–15.5)
WBC: 10.5 10*3/uL (ref 4.0–10.5)
nRBC: 0 % (ref 0.0–0.2)

## 2020-05-09 LAB — CBG MONITORING, ED
Glucose-Capillary: 223 mg/dL — ABNORMAL HIGH (ref 70–99)
Glucose-Capillary: 143 mg/dL — ABNORMAL HIGH (ref 70–99)
Glucose-Capillary: 150 mg/dL — ABNORMAL HIGH (ref 70–99)

## 2020-05-09 LAB — URINALYSIS, ROUTINE W REFLEX MICROSCOPIC
Bilirubin Urine: NEGATIVE
Glucose, UA: 150 mg/dL — AB
Hgb urine dipstick: NEGATIVE
Ketones, ur: NEGATIVE mg/dL
Leukocytes,Ua: NEGATIVE
Nitrite: NEGATIVE
Protein, ur: NEGATIVE mg/dL
Specific Gravity, Urine: 1.031 — ABNORMAL HIGH (ref 1.005–1.030)
pH: 6 (ref 5.0–8.0)

## 2020-05-09 LAB — HEMOGLOBIN A1C
Hgb A1c MFr Bld: 8 % — ABNORMAL HIGH (ref 4.8–5.6)
Mean Plasma Glucose: 182.9 mg/dL

## 2020-05-09 LAB — ETHANOL: Alcohol, Ethyl (B): <10 mg/dL (ref <10)

## 2020-05-09 MED ORDER — GABAPENTIN 100 MG PO CAPS
100.0000 mg | ORAL_CAPSULE | Freq: Every day | ORAL | Status: DC
  Administered 2020-05-10 – 2020-05-12 (×3): 100 mg via ORAL
  Filled 2020-05-09 (×3): qty 1

## 2020-05-09 MED ORDER — INSULIN ASPART 100 UNIT/ML ~~LOC~~ SOLN
0.0000 [IU] | SUBCUTANEOUS | Status: DC
  Administered 2020-05-10 – 2020-05-11 (×2): 1 [IU] via SUBCUTANEOUS
  Administered 2020-05-11: 2 [IU] via SUBCUTANEOUS
  Administered 2020-05-11 – 2020-05-12 (×2): 1 [IU] via SUBCUTANEOUS
  Filled 2020-05-09: qty 1

## 2020-05-09 MED ORDER — INSULIN GLARGINE 100 UNIT/ML ~~LOC~~ SOLN
20.0000 [IU] | Freq: Every day | SUBCUTANEOUS | Status: DC
  Administered 2020-05-09 – 2020-05-11 (×3): 20 [IU] via SUBCUTANEOUS
  Filled 2020-05-09 (×4): qty 0.2

## 2020-05-09 MED ORDER — LEVETIRACETAM 500 MG PO TABS: 500.0000 mg | ORAL_TABLET | Freq: Two times a day (BID) | ORAL | Status: DC

## 2020-05-09 MED ORDER — LEVETIRACETAM IN NACL 1500 MG/100ML IV SOLN
1500.0000 mg | Freq: Once | INTRAVENOUS | Status: AC
  Administered 2020-05-09: 1500 mg via INTRAVENOUS
  Filled 2020-05-09: qty 100

## 2020-05-09 MED ORDER — ONDANSETRON HCL 4 MG/2ML IJ SOLN
4.0000 mg | Freq: Four times a day (QID) | INTRAMUSCULAR | Status: DC | PRN
  Administered 2020-05-10: 4 mg via INTRAVENOUS
  Filled 2020-05-09: qty 2

## 2020-05-09 MED ORDER — SODIUM CHLORIDE 0.9 % IV SOLN
25.0000 mg | Freq: Four times a day (QID) | INTRAVENOUS | Status: DC | PRN
  Administered 2020-05-09: 25 mg via INTRAVENOUS
  Filled 2020-05-09: qty 1

## 2020-05-09 MED ORDER — MORPHINE SULFATE (PF) 2 MG/ML IV SOLN
1.0000 mg | INTRAVENOUS | Status: DC | PRN
  Administered 2020-05-09 (×2): 2 mg via INTRAVENOUS
  Filled 2020-05-09 (×2): qty 1

## 2020-05-09 MED ORDER — ONDANSETRON HCL 4 MG/2ML IJ SOLN
INTRAMUSCULAR | Status: AC
  Administered 2020-05-09: 4 mg via INTRAVENOUS
  Filled 2020-05-09: qty 2

## 2020-05-09 MED ORDER — LEVETIRACETAM IN NACL 500 MG/100ML IV SOLN
500.0000 mg | Freq: Two times a day (BID) | INTRAVENOUS | Status: DC
  Administered 2020-05-09 – 2020-05-12 (×6): 500 mg via INTRAVENOUS
  Filled 2020-05-09 (×6): qty 100

## 2020-05-09 MED ORDER — SODIUM CHLORIDE 0.9 % IV SOLN: INTRAVENOUS | Status: DC

## 2020-05-09 MED ORDER — HYDRALAZINE HCL 20 MG/ML IJ SOLN
10.0000 mg | Freq: Once | INTRAMUSCULAR | Status: AC
  Administered 2020-05-09: 10 mg via INTRAVENOUS
  Filled 2020-05-09: qty 1

## 2020-05-09 MED ORDER — IOHEXOL 350 MG/ML SOLN
75.0000 mL | Freq: Once | INTRAVENOUS | Status: AC | PRN
  Administered 2020-05-09: 75 mL via INTRAVENOUS

## 2020-05-09 MED ORDER — PANTOPRAZOLE SODIUM 40 MG PO TBEC
40.0000 mg | DELAYED_RELEASE_TABLET | Freq: Every day | ORAL | Status: DC
  Administered 2020-05-10 – 2020-05-12 (×3): 40 mg via ORAL
  Filled 2020-05-09 (×3): qty 1

## 2020-05-09 MED ORDER — HYDRALAZINE HCL 20 MG/ML IJ SOLN
10.0000 mg | INTRAMUSCULAR | Status: DC | PRN
  Administered 2020-05-09: 10 mg via INTRAVENOUS
  Filled 2020-05-09: qty 1

## 2020-05-09 MED ORDER — HYDRALAZINE HCL 20 MG/ML IJ SOLN
5.0000 mg | INTRAMUSCULAR | Status: DC | PRN
  Administered 2020-05-09: 5 mg via INTRAVENOUS
  Filled 2020-05-09: qty 1

## 2020-05-09 MED ORDER — PROMETHAZINE HCL 25 MG/ML IJ SOLN
INTRAMUSCULAR | Status: AC
  Filled 2020-05-09: qty 1

## 2020-05-09 MED ORDER — ONDANSETRON HCL 4 MG/2ML IJ SOLN
4.0000 mg | Freq: Once | INTRAMUSCULAR | Status: AC
  Administered 2020-05-09: 4 mg via INTRAVENOUS
  Filled 2020-05-09: qty 2

## 2020-05-09 NOTE — H&P (Addendum)
TRH H&P   Patient Demographics:    Nicholas [name], is a 60 y.o. male  MRN: [id number]   DOB - 06-20-1960  Admit Date - 05/09/2020  Outpatient Primary MD for the patient is Sharilyn Sites, MD  Referring MD/NP/PA: PA Idol   Patient coming from: Home  Chief Complaint  Patient presents with  . Code Stroke      HPI:    Nicholas [name]  is a 60 y.o. male, nickel history of insulin-dependent type 2 diabetes mellitus, CAD s/p DES, hyperlipidemia, history of CVA, patient presents to ED for evaluation of sudden onset right-sided vision loss, and confusion, and difficulty understanding question, is noticed by his daughter , patient himself with significant receptive aphasia, cannot answer any questions appropriately, cannot follow any commands, was obtained from family at bedside and ED staff, patient apparently walk well this morning, went to work, he was driving home around 1779, when he first noticed vision changes, he called his daughter, by the time he arrived home, he was having difficulty understanding and responding to questions, often to ED, and she noticed significant worsening confusion during the drive, patient on aspirin and Plavix chronically, wife at bedside report he is compliant with medications, she denies knowing him having any head trauma, no head injury, reports he has been complaining of headaches for past few weeks. - in ED CT head was significant for subdural hematoma, with mass-effect, but no midline shift, telemetry neurology were consulted which recommended Keppra for seizure prophylaxis, and admission [facility] for possible need of LTG EEG, ED discussed with the neurosurgery Dr. Ronnald Ramp, who reported no surgical intervention recommended at this point, [facility]ist consulted to admit.    Review of systems:    Unable to obtain any appropriate review of system given his  profound receptive aphasia, unable to answer any questions or follow any commands.  With Past History of the following :    Past Medical History:  Diagnosis Date  . Anal lesion    Anal papilla/tags - external lesion  . Bilateral carpal tunnel syndrome 11/24/2018  . Colon polyp    Hyperplastic rectosigmoid polyp 9/08  . Coronary atherosclerosis of native coronary artery    DES to PLB, BMS to mid and distal RCA, DES to circumflex and distal LAD, 6/10 (5 stents)  . Diabetic peripheral neuropathy ([place]) 11/24/2018  . GERD (gastroesophageal reflux disease)   . Helicobacter pylori (H. pylori) 09/2009   Treated with helidac  . Hiatal hernia   . Hyperplastic colon polyp 2008  . Mixed hyperlipidemia   . Myocardial infarction ([place]) 06/2008  . Stroke [facility])    January 2018  . Type 2 diabetes mellitus ([place])       Past Surgical History:  Procedure Laterality Date  . CHOLECYSTECTOMY    . COLLAPSED LUNG     15 years ago  . COLONOSCOPY  10/12/2006  Dr. Gala Romney- rectosigmoid polyp s/phot snare polypectomy o/w normal rectum and terminal ileum. hyperplastic polyp on bx  . COLONOSCOPY WITH PROPOFOL N/A 09/01/2019   Procedure: COLONOSCOPY WITH PROPOFOL;  Surgeon: Daneil Dolin, MD;  [facility];  Service: Endoscopy;  Laterality: N/A;  8:30am  . EP IMPLANTABLE DEVICE N/A 02/12/2016   Procedure: Loop Recorder Insertion;  Surgeon: Thompson Grayer, MD;  Location: [place] [facility] LAB;  Service: Cardiovascular;  Laterality: N/A;  . ESOPHAGOGASTRODUODENOSCOPY  10/12/2006   Dr. Gala Romney- examination of the tubular esophagus revealed no mucosal abnormalities. the EG junction was easily traversed. small hiatal hernia, the gastric mucosa o/w appeared normal. there was no infiltrating process or frank ulcer seen.  . implantable loop recorder removal  09/29/2019   MDT Reveal LINQ removed in office by Dr Rayann Heman  . TEE WITHOUT CARDIOVERSION N/A 02/12/2016   Procedure: TRANSESOPHAGEAL ECHOCARDIOGRAM (TEE);   Surgeon: Jerline Pain, MD;  Location: [facility] ENDOSCOPY;  Service: Cardiovascular;  Laterality: N/A;  . WIRE IN APEX OF RIGHT LUNG     Since childhood      Social History:     Social History   Tobacco Use  . Smoking status: Current Every Day Smoker    Packs/day: 0.50    Years: 34.00    Pack years: 17.00    Types: Cigarettes    Start date: 04/08/1973  . Smokeless tobacco: Never Used  Substance Use Topics  . Alcohol use: No    Alcohol/week: 0.0 standard drinks       Family History :     Family History  Problem Relation Age of Onset  . Diabetes Mother   . Heart disease Father   . Cancer Brother   . CVA Neg Hx       Home Medications:   Prior to Admission medications   Medication Sig Start Date End Date Taking? Authorizing Provider  albuterol (VENTOLIN HFA) 108 (90 Base) MCG/ACT inhaler Inhale 1-2 puffs into the lungs every 6 (six) hours as needed for wheezing or shortness of breath.    [provider]  aspirin EC 325 MG tablet Take 325 mg by mouth daily.    [provider]  BD PEN NEEDLE NANO U/F 32G X 4 MM MISC USE AS DIRECTED FOUR TIMES DAILY. 08/31/17   Cassandria Anger, MD  clopidogrel (PLAVIX) 75 MG tablet Take 1 tablet by mouth once daily 10/24/19   Satira Sark, MD  Continuous Blood Gluc Receiver (FREESTYLE LIBRE 2 READER) DEVI As directed 12/16/19   Cassandria Anger, MD  Continuous Blood Gluc Sensor (FREESTYLE LIBRE 2 SENSOR) MISC 1 Piece by Does not apply route every 14 (fourteen) days. 05/08/20   Cassandria Anger, MD  gabapentin (NEURONTIN) 100 MG capsule Take 100 mg by mouth daily.  01/29/19   [provider]  LANTUS SOLOSTAR 100 UNIT/ML Solostar Pen INJECT 40 UNITS SUBCUTANEOUSLY ONCE DAILY 05/02/20   Cassandria Anger, MD  nitroGLYCERIN (NITROSTAT) 0.4 MG SL tablet DISSOLVE 1 TABLET UNDER TONGUE EVERY 5 MINUTES UP TO 15 MIN FOR CHESTPAIN. IF NO RELIEF CALL 911. 05/17/19   Satira Sark, MD  pantoprazole (PROTONIX)  40 MG tablet Take 40 mg by mouth daily.  04/11/10   [provider]  SUPREP BOWEL PREP KIT 17.5-3.13-1.6 GM/177ML SOLN Take 354 mLs by mouth once. 08/03/19   [provider]  traMADol (ULTRAM) 50 MG tablet Take 1 tablet (50 mg total) by mouth every 6 (six) hours as needed for  moderate pain. 02/12/16   Geradine Girt, DO     Allergies:    No Known Allergies   Physical Exam:   Vitals  Blood pressure 132/82, pulse 97, temperature 98 F (36.7 C), temperature source Oral, resp. rate 13, SpO2 100 %.   1. General well developed male, laying in bed, no apparent distress  2.  Diminished affect and insight, unable to follow any commands or answer any questions.   3.  Unable to perform neurological exam as he is unable to follow any commands or answer any questions but he appears to be moving his extremities without gross deficits .  4. Ears and Eyes appear Normal, Conjunctivae clear, unable to examine visual field, moist Oral Mucosa.  5. Supple Neck, No JVD, No cervical lymphadenopathy appriciated, No Carotid Bruits.  6. Symmetrical Chest wall movement, Good air movement bilaterally, CTAB.  7. RRR, No Gallops, Rubs or Murmurs, No Parasternal Heave.  8. Positive Bowel Sounds, Abdomen Soft, No tenderness, No organomegaly appriciated,No rebound -guarding or rigidity.  9.  No Cyanosis, Normal Skin Turgor, No Skin Rash or Bruise.  10. Good muscle tone,  joints appear normal , no effusions, Normal ROM.  11. No Palpable Lymph Nodes in Neck or Axillae    Data Review:    CBC Recent Labs  Lab 05/09/20 1347  WBC 10.5  HGB 14.9  HCT 45.3  PLT 183  MCV 83.6  MCH 27.5  MCHC 32.9  RDW 14.0  LYMPHSABS 2.0  MONOABS 0.7  EOSABS 0.1  BASOSABS 0.1   ------------------------------------------------------------------------------------------------------------------  Chemistries  Recent Labs  Lab 05/09/20 1347  NA 136  K 4.0  CL 102  CO2 24  GLUCOSE 194*  BUN 10   CREATININE 0.71  CALCIUM 9.0  AST 20  ALT 21  ALKPHOS 60  BILITOT 0.7   ------------------------------------------------------------------------------------------------------------------ estimated creatinine clearance is 97 mL/min (by C-G formula based on SCr of 0.71 mg/dL). ------------------------------------------------------------------------------------------------------------------ No results for input(s): TSH, T4TOTAL, T3FREE, THYROIDAB in the last 72 hours.  Invalid input(s): FREET3  Coagulation profile Recent Labs  Lab 05/09/20 1347  INR 1.0   ------------------------------------------------------------------------------------------------------------------- No results for input(s): DDIMER in the last 72 hours. -------------------------------------------------------------------------------------------------------------------  Cardiac Enzymes No results for input(s): CKMB, TROPONINI, MYOGLOBIN in the last 168 hours.  Invalid input(s): CK ------------------------------------------------------------------------------------------------------------------ No results found for: BNP   ---------------------------------------------------------------------------------------------------------------  Urinalysis    Component Value Date/Time   COLORURINE YELLOW 05/09/2020 1501   APPEARANCEUR CLEAR 05/09/2020 1501   LABSPEC 1.031 (H) 05/09/2020 1501   PHURINE 6.0 05/09/2020 1501   GLUCOSEU 150 (A) 05/09/2020 1501   HGBUR NEGATIVE 05/09/2020 1501   BILIRUBINUR NEGATIVE 05/09/2020 1501   KETONESUR NEGATIVE 05/09/2020 1501   PROTEINUR NEGATIVE 05/09/2020 1501   NITRITE NEGATIVE 05/09/2020 1501   LEUKOCYTESUR NEGATIVE 05/09/2020 1501    ----------------------------------------------------------------------------------------------------------------   Imaging Results:    CT HEAD CODE STROKE WO CONTRAST  Result Date: 05/09/2020 CLINICAL DATA:  Code stroke. Acute neuro  deficit. Confusion slurred speech. EXAM: CT HEAD WITHOUT CONTRAST TECHNIQUE: Contiguous axial images were obtained from the base of the skull through the vertex without intravenous contrast. COMPARISON:  CT head 03/03/2018 FINDINGS: Brain: Bilateral extra-axial fluid collections left greater than right. These have intermediate density, slightly above CSF in likely are areas of subacute subdural hematoma. No high-density hemorrhage. Mild mass-effect on the left cerebral hemisphere. Left-sided fluid collection measures 8 mm in thickness and right-sided fluid collection measures 5 mm in thickness. Ventricle size normal. No midline shift. Negative  for acute infarct or mass. Vascular: Negative for hyperdense vessel Skull: Negative Sinuses/Orbits: Mucosal edema paranasal sinuses.  Negative orbit Other: None ASPECTS ([facility]an Stroke Program Early CT Score) - Ganglionic level infarction (caudate, lentiform nuclei, internal capsule, insula, M1-M3 cortex): 7 - Supraganglionic infarction (M4-M6 cortex): 3 Total score (0-10 with 10 being normal): 10 IMPRESSION: 1. Bilateral intermediate density subdural collections left greater than right. 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 No high-density hemorrhage. Mild mass-effect on the left cerebral hemisphere without midline shift. 2. ASPECTS is 10 3. These results were called by telephone at the time of interpretation on 05/09/2020 at 2:03 pm to provider [facility] , who verbally acknowledged these results. Electronically Signed   By: Franchot Gallo M.D.   On: 05/09/2020 14:04   CT ANGIO HEAD CODE STROKE  Result Date: 05/09/2020 CLINICAL DATA:  Stroke/TIA, assess intracranial arteries. Patient confusion, slurred speech, vision loss. EXAM: CT ANGIOGRAPHY HEAD TECHNIQUE: Multidetector CT imaging of the head was performed using the standard protocol during bolus administration of intravenous contrast. Multiplanar CT image reconstructions and MIPs were obtained to evaluate the vascular  anatomy. CONTRAST:  19m OMNIPAQUE IOHEXOL 350 MG/ML SOLN COMPARISON:  Same day CT head.  MRA March 03, 2018. FINDINGS: Anterior circulation: Bilateral intracranial internal carotid arteries, middle cerebral arteries, and anterior cerebral arteries are patent without evidence of proximal hemodynamically significant stenosis. Limited evaluation of the distal vasculature due to venous contamination. Posterior circulation: Bilateral intradural vertebral arteries, basilar artery and posterior cerebral arteries are patent proximally. No evidence of proximal hemodynamically significant stenosis. Limited evaluation of the distal vessels due to venous contamination. Venous sinuses: As permitted by contrast timing, patent. Small left transverse and sigmoid sinuses. Review of the MIP images confirms the above findings. IMPRESSION: No evidence of large vessel occlusion or proximal hemodynamically significant stenosis. Limited evaluation of the distal vasculature due to venous contamination. Electronically Signed   By: FMargaretha SheffieldMD   On: 05/09/2020 14:41   CT ANGIO NECK CODE STROKE  Result Date: 05/09/2020 CLINICAL DATA:  Acute neuro deficit. Slurred speech. Headache 3 weeks. EXAM: CT ANGIOGRAPHY HEAD AND NECK TECHNIQUE: Multidetector CT imaging of the head and neck was performed using the standard protocol during bolus administration of intravenous contrast. Multiplanar CT image reconstructions and MIPs were obtained to evaluate the vascular anatomy. Carotid stenosis measurements (when applicable) are obtained utilizing NASCET criteria, using the distal internal carotid diameter as the denominator. CONTRAST:  777mOMNIPAQUE IOHEXOL 350 MG/ML SOLN COMPARISON:  CT head 05/09/2020 FINDINGS: CTA NECK FINDINGS Aortic arch: Standard branching. Imaged portion shows no evidence of aneurysm or dissection. No significant stenosis of the major arch vessel origins. Right carotid system: Atherosclerotic calcification right  carotid bifurcation. Approximately 45% diameter stenosis proximal right internal carotid artery due to calcific plaque. Left carotid system: Mild atherosclerotic calcification left carotid bifurcation without significant stenosis. Vertebral arteries: Normal vertebral arteries bilaterally. Skeleton: Mild cervical spondylosis.  No acute skeletal abnormality. Other neck: Negative for mass or edema in the neck. Upper chest: Lung apices clear bilaterally. Review of the MIP images confirms the above findings CTA HEAD FINDINGS Anterior circulation: Mild atherosclerotic calcification in the cavernous carotid bilaterally without stenosis. Anterior and middle cerebral arteries widely patent without stenosis or large vessel occlusion. Posterior circulation: Both vertebral arteries patent to the basilar. PICA patent bilaterally. Basilar widely patent. Superior cerebellar and posterior cerebral arteries normal bilaterally. Venous sinuses: Normal venous enhancement. Anatomic variants: None Review of the MIP images confirms the above findings IMPRESSION: 1. Negative for intracranial  large vessel occlusion. 2. Atherosclerotic disease carotid bifurcation with less than 50% diameter stenosis bilaterally. Both vertebral arteries widely patent. 3. These results were called by telephone at the time of interpretation on 05/09/2020 at 2:35 pm to provider [facility] , who verbally acknowledged these results. Electronically Signed   By: Franchot Gallo M.D.   On: 05/09/2020 14:36    My personal review of EKG: pending   Assessment & Plan:    Active Problems:   CORONARY ATHEROSCLEROSIS NATIVE CORONARY ARTERY   GERD   Type 2 diabetes mellitus with vascular disease (HCC)   Essential hypertension, benign   Hyperlipidemia   Hyperlipemia   Diabetic peripheral neuropathy (HCC)   Subdural hematoma (HCC)   Subdural hematoma -No clear etiology, wife denies patient having any falls. -With significant receptive  aphasia. -Continue to hold aspirin and Plavix. -ED discussed with neurosurgery at [facility] Dr. Ronnald Ramp, so far no recommendation for surgical intervention. -Patient will be admitted to [facility] for close follow-up by neurology discussed with teleneuro, will continue with neurochecks every hour, will repeat CT head if significant change in mental status. -We will start on Keppra per neurology recommendation, as discussed with teleneuro likely will need EEG to rule out any seizures as the location of subdural hematoma might provoke seizures. -We will consult PT/OT/SLP -SCD for DVT prophylaxis -As needed blood pressure medicine to keep blood pressure <140 per neurology recommendation. -Keep platelet 100 K, keep INR><1.4 per neurology recommendation   coronary artery disease/history of CVA -will hold aspirin and Plavix due to above .  type 2 diabetes mellitus - will start lower dose lantus at 20 units(down from 40) - will add insulin sliding scale.  tobacco abuse -will start nicotine patch  GERD -continue PPI  essential hypertension -Keep on as needed hydralazine to keep blood pressure less than 140.   DVT Prophylaxis SCDs  AM Labs Ordered, also please review Full Orders  Family Communication: Admission, patients condition and plan of care including tests being ordered have been discussed with the patient and wife at bedside,  who indicate understanding and agree with the plan and Code Status.  Code Status Full  Likely DC to  home  Condition GUARDED   Consults called: tele neuro , ED D/W   Admission status: inpatient  Time spent in minutes : 60 minutes   Phillips Climes M.D on 05/09/2020 at 3:48 PM   [facility] - Office  [phone]

## 2020-05-09 NOTE — ED Notes (Signed)
Pt having emesis and shaking. MD made aware. Zofran given and pt taken to CT scanner for stat head CT per MD orders.

## 2020-05-09 NOTE — ED Triage Notes (Signed)
PT family member reports pt lost vision from one of his eyes around 1315 today.  Last known well time is not known.  Pt is awake and alert but is confused and is answering questions slowly.

## 2020-05-09 NOTE — ED Notes (Signed)
Pt taken back to CT for CT Angio and CT Perfusion.

## 2020-05-09 NOTE — ED Notes (Signed)
Per MD, please keep pt NPO unless he has to have meds by mouth. Order for NPO is present.

## 2020-05-09 NOTE — H&P (Incomplete Revision)
TRH H&P   Patient Demographics:    Nicholas [name], is a 60 y.o. male  MRN: [id number]   DOB - [birth year]  Admit Date - 05/09/2020  Outpatient Primary MD for the patient is Sharilyn Sites, MD  Referring MD/NP/PA: PA Idol   Patient coming from: Home  Chief Complaint  Patient presents with  . Code Stroke      HPI:    Nicholas [name]  is a 60 y.o. male, nickel history of insulin-dependent type 2 diabetes mellitus, CAD s/p DES, hyperlipidemia, history of CVA, patient presents to ED for evaluation of sudden onset right-sided vision loss, and confusion, and difficulty understanding question, is noticed by his daughter , patient himself with significant receptive aphasia, cannot answer any questions appropriately, cannot follow any commands, was obtained from family at bedside and ED staff, patient apparently walk well this morning, went to work, he was driving home around 4332, when he first noticed vision changes, he called his daughter, by the time he arrived home, he was having difficulty understanding and responding to questions, often to ED, and she noticed significant worsening confusion during the drive, patient on aspirin and Plavix chronically, wife at bedside report he is compliant with medications, she denies knowing him having any head trauma, no head injury, reports he has been complaining of headaches for past few weeks. - in ED CT head was significant for subdural hematoma, with mass-effect, but no midline shift, telemetry neurology were consulted which recommended Keppra for seizure prophylaxis, and admission [facility] for possible need of LTG EEG, ED discussed with the neurosurgery Dr. Ronnald Ramp, who reported no surgical intervention recommended at this point, [facility] consulted to admit.    Review of systems:    Unable to obtain any appropriate review of system given his  profound receptive aphasia, unable to answer any questions or follow any commands.  With Past History of the following :    Past Medical History:  Diagnosis Date  . Anal lesion    Anal papilla/tags - external lesion  . Bilateral carpal tunnel syndrome 11/24/2018  . Colon polyp    Hyperplastic rectosigmoid polyp 9/08  . Coronary atherosclerosis of native coronary artery    DES to PLB, BMS to mid and distal RCA, DES to circumflex and distal LAD, 6/10 (5 stents)  . Diabetic peripheral neuropathy ([place]) 11/24/2018  . GERD (gastroesophageal reflux disease)   . Helicobacter pylori (H. pylori) 09/2009   Treated with helidac  . Hiatal hernia   . Hyperplastic colon polyp 2008  . Mixed hyperlipidemia   . Myocardial infarction ([place]) 06/2008  . Stroke [facility])    January 2018  . Type 2 diabetes mellitus ([place])       Past Surgical History:  Procedure Laterality Date  . CHOLECYSTECTOMY    . COLLAPSED LUNG     15 years ago  . COLONOSCOPY  10/12/2006  Dr. Gala Romney- rectosigmoid polyp s/phot snare polypectomy o/w normal rectum and terminal ileum. hyperplastic polyp on bx  . COLONOSCOPY WITH PROPOFOL N/A 09/01/2019   Procedure: COLONOSCOPY WITH PROPOFOL;  Surgeon: Daneil Dolin, MD;  Location: [facility];  Service: Endoscopy;  Laterality: N/A;  8:30am  . EP IMPLANTABLE DEVICE N/A 02/12/2016   Procedure: Loop Recorder Insertion;  Surgeon: Thompson Grayer, MD;  Location: [place] [facility];  Service: Cardiovascular;  Laterality: N/A;  . ESOPHAGOGASTRODUODENOSCOPY  10/12/2006   Dr. Gala Romney- examination of the tubular esophagus revealed no mucosal abnormalities. the EG junction was easily traversed. small hiatal hernia, the gastric mucosa o/w appeared normal. there was no infiltrating process or frank ulcer seen.  . implantable loop recorder removal  09/29/2019   MDT Reveal LINQ removed in office by Dr Rayann Heman  . TEE WITHOUT CARDIOVERSION N/A 02/12/2016   Procedure: TRANSESOPHAGEAL ECHOCARDIOGRAM (TEE);   Surgeon: Jerline Pain, MD;  Location: [facility] ENDOSCOPY;  Service: Cardiovascular;  Laterality: N/A;  . WIRE IN APEX OF RIGHT LUNG     Since childhood      Social History:     Social History   Tobacco Use  . Smoking status: Current Every Day Smoker    Packs/day: 0.50    Years: 34.00    Pack years: 17.00    Types: Cigarettes    Start date: 04/08/1973  . Smokeless tobacco: Never Used  Substance Use Topics  . Alcohol use: No    Alcohol/week: 0.0 standard drinks       Family History :     Family History  Problem Relation Age of Onset  . Diabetes Mother   . Heart disease Father   . Cancer Brother   . CVA Neg Hx       Home Medications:   Prior to Admission medications   Medication Sig Start Date End Date Taking? Authorizing Provider  albuterol (VENTOLIN HFA) 108 (90 Base) MCG/ACT inhaler Inhale 1-2 puffs into the lungs every 6 (six) hours as needed for wheezing or shortness of breath.    [provider]  aspirin EC 325 MG tablet Take 325 mg by mouth daily.    [provider]  BD PEN NEEDLE NANO U/F 32G X 4 MM MISC USE AS DIRECTED FOUR TIMES DAILY. 08/31/17   Cassandria Anger, MD  clopidogrel (PLAVIX) 75 MG tablet Take 1 tablet by mouth once daily 10/24/19   Satira Sark, MD  Continuous Blood Gluc Receiver (FREESTYLE LIBRE 2 READER) DEVI As directed 12/16/19   Cassandria Anger, MD  Continuous Blood Gluc Sensor (FREESTYLE LIBRE 2 SENSOR) MISC 1 Piece by Does not apply route every 14 (fourteen) days. 05/08/20   Cassandria Anger, MD  gabapentin (NEURONTIN) 100 MG capsule Take 100 mg by mouth daily.  01/29/19   [provider]  LANTUS SOLOSTAR 100 UNIT/ML Solostar Pen INJECT 40 UNITS SUBCUTANEOUSLY ONCE DAILY 05/02/20   Cassandria Anger, MD  nitroGLYCERIN (NITROSTAT) 0.4 MG SL tablet DISSOLVE 1 TABLET UNDER TONGUE EVERY 5 MINUTES UP TO 15 MIN FOR CHESTPAIN. IF NO RELIEF CALL 911. 05/17/19   Satira Sark, MD  pantoprazole (PROTONIX)  40 MG tablet Take 40 mg by mouth daily.  04/11/10   [provider]  SUPREP BOWEL PREP KIT 17.5-3.13-1.6 GM/177ML SOLN Take 354 mLs by mouth once. 08/03/19   [provider]  traMADol (ULTRAM) 50 MG tablet Take 1 tablet (50 mg total) by mouth every 6 (six) hours as needed for  moderate pain. 02/12/16   Geradine Girt, DO     Allergies:    No Known Allergies   Physical Exam:   Vitals  Blood pressure 132/82, pulse 97, temperature 98 F (36.7 C), temperature source Oral, resp. rate 13, SpO2 100 %.   1. General well developed male, laying in bed, no apparent distress  2.  Diminished affect and insight, unable to follow any commands or answer any questions.   3.  Unable to perform neurological exam as he is unable to follow any commands or answer any questions but he appears to be moving his extremities without gross deficits .  4. Ears and Eyes appear Normal, Conjunctivae clear, unable to examine visual field, moist Oral Mucosa.  5. Supple Neck, No JVD, No cervical lymphadenopathy appriciated, No Carotid Bruits.  6. Symmetrical Chest wall movement, Good air movement bilaterally, CTAB.  7. RRR, No Gallops, Rubs or Murmurs, No Parasternal Heave.  8. Positive Bowel Sounds, Abdomen Soft, No tenderness, No organomegaly appriciated,No rebound -guarding or rigidity.  9.  No Cyanosis, Normal Skin Turgor, No Skin Rash or Bruise.  10. Good muscle tone,  joints appear normal , no effusions, Normal ROM.  11. No Palpable Lymph Nodes in Neck or Axillae    Data Review:    CBC Recent Labs  Lab 05/09/20 1347  WBC 10.5  HGB 14.9  HCT 45.3  PLT 183  MCV 83.6  MCH 27.5  MCHC 32.9  RDW 14.0  LYMPHSABS 2.0  MONOABS 0.7  EOSABS 0.1  BASOSABS 0.1   ------------------------------------------------------------------------------------------------------------------  Chemistries  Recent Labs  Lab 05/09/20 1347  NA 136  K 4.0  CL 102  CO2 24  GLUCOSE 194*  BUN 10   CREATININE 0.71  CALCIUM 9.0  AST 20  ALT 21  ALKPHOS 60  BILITOT 0.7   ------------------------------------------------------------------------------------------------------------------ estimated creatinine clearance is 97 mL/min (by C-G formula based on SCr of 0.71 mg/dL). ------------------------------------------------------------------------------------------------------------------ No results for input(s): TSH, T4TOTAL, T3FREE, THYROIDAB in the last 72 hours.  Invalid input(s): FREET3  Coagulation profile Recent Labs  Lab 05/09/20 1347  INR 1.0   ------------------------------------------------------------------------------------------------------------------- No results for input(s): DDIMER in the last 72 hours. -------------------------------------------------------------------------------------------------------------------  Cardiac Enzymes No results for input(s): CKMB, TROPONINI, MYOGLOBIN in the last 168 hours.  Invalid input(s): CK ------------------------------------------------------------------------------------------------------------------ No results found for: BNP   ---------------------------------------------------------------------------------------------------------------  Urinalysis    Component Value Date/Time   COLORURINE YELLOW 05/09/2020 1501   APPEARANCEUR CLEAR 05/09/2020 1501   LABSPEC 1.031 (H) 05/09/2020 1501   PHURINE 6.0 05/09/2020 1501   GLUCOSEU 150 (A) 05/09/2020 1501   HGBUR NEGATIVE 05/09/2020 1501   BILIRUBINUR NEGATIVE 05/09/2020 1501   KETONESUR NEGATIVE 05/09/2020 1501   PROTEINUR NEGATIVE 05/09/2020 1501   NITRITE NEGATIVE 05/09/2020 1501   LEUKOCYTESUR NEGATIVE 05/09/2020 1501    ----------------------------------------------------------------------------------------------------------------   Imaging Results:    CT HEAD CODE STROKE WO CONTRAST  Result Date: 05/09/2020 CLINICAL DATA:  Code stroke. Acute neuro  deficit. Confusion slurred speech. EXAM: CT HEAD WITHOUT CONTRAST TECHNIQUE: Contiguous axial images were obtained from the base of the skull through the vertex without intravenous contrast. COMPARISON:  CT head 03/03/2018 FINDINGS: Brain: Bilateral extra-axial fluid collections left greater than right. These have intermediate density, slightly above CSF in likely are areas of subacute subdural hematoma. No high-density hemorrhage. Mild mass-effect on the left cerebral hemisphere. Left-sided fluid collection measures 8 mm in thickness and right-sided fluid collection measures 5 mm in thickness. Ventricle size normal. No midline shift. Negative  for acute infarct or mass. Vascular: Negative for hyperdense vessel Skull: Negative Sinuses/Orbits: Mucosal edema paranasal sinuses.  Negative orbit Other: None ASPECTS ([place] Stroke Program Early CT Score) - Ganglionic level infarction (caudate, lentiform nuclei, internal capsule, insula, M1-M3 cortex): 7 - Supraganglionic infarction (M4-M6 cortex): 3 Total score (0-10 with 10 being normal): 10 IMPRESSION: 1. Bilateral intermediate density subdural collections left greater than right. 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 No high-density hemorrhage. Mild mass-effect on the left cerebral hemisphere without midline shift. 2. ASPECTS is 10 3. These results were called by telephone at the time of interpretation on 05/09/2020 at 2:03 pm to provider [facility] , who verbally acknowledged these results. Electronically Signed   By: Franchot Gallo M.D.   On: 05/09/2020 14:04   CT ANGIO HEAD CODE STROKE  Result Date: 05/09/2020 CLINICAL DATA:  Stroke/TIA, assess intracranial arteries. Patient confusion, slurred speech, vision loss. EXAM: CT ANGIOGRAPHY HEAD TECHNIQUE: Multidetector CT imaging of the head was performed using the standard protocol during bolus administration of intravenous contrast. Multiplanar CT image reconstructions and MIPs were obtained to evaluate the vascular  anatomy. CONTRAST:  45m OMNIPAQUE IOHEXOL 350 MG/ML SOLN COMPARISON:  Same day CT head.  MRA March 03, 2018. FINDINGS: Anterior circulation: Bilateral intracranial internal carotid arteries, middle cerebral arteries, and anterior cerebral arteries are patent without evidence of proximal hemodynamically significant stenosis. Limited evaluation of the distal vasculature due to venous contamination. Posterior circulation: Bilateral intradural vertebral arteries, basilar artery and posterior cerebral arteries are patent proximally. No evidence of proximal hemodynamically significant stenosis. Limited evaluation of the distal vessels due to venous contamination. Venous sinuses: As permitted by contrast timing, patent. Small left transverse and sigmoid sinuses. Review of the MIP images confirms the above findings. IMPRESSION: No evidence of large vessel occlusion or proximal hemodynamically significant stenosis. Limited evaluation of the distal vasculature due to venous contamination. Electronically Signed   By: FMargaretha SheffieldMD   On: 05/09/2020 14:41   CT ANGIO NECK CODE STROKE  Result Date: 05/09/2020 CLINICAL DATA:  Acute neuro deficit. Slurred speech. Headache 3 weeks. EXAM: CT ANGIOGRAPHY HEAD AND NECK TECHNIQUE: Multidetector CT imaging of the head and neck was performed using the standard protocol during bolus administration of intravenous contrast. Multiplanar CT image reconstructions and MIPs were obtained to evaluate the vascular anatomy. Carotid stenosis measurements (when applicable) are obtained utilizing NASCET criteria, using the distal internal carotid diameter as the denominator. CONTRAST:  780mOMNIPAQUE IOHEXOL 350 MG/ML SOLN COMPARISON:  CT head 05/09/2020 FINDINGS: CTA NECK FINDINGS Aortic arch: Standard branching. Imaged portion shows no evidence of aneurysm or dissection. No significant stenosis of the major arch vessel origins. Right carotid system: Atherosclerotic calcification right  carotid bifurcation. Approximately 45% diameter stenosis proximal right internal carotid artery due to calcific plaque. Left carotid system: Mild atherosclerotic calcification left carotid bifurcation without significant stenosis. Vertebral arteries: Normal vertebral arteries bilaterally. Skeleton: Mild cervical spondylosis.  No acute skeletal abnormality. Other neck: Negative for mass or edema in the neck. Upper chest: Lung apices clear bilaterally. Review of the MIP images confirms the above findings CTA HEAD FINDINGS Anterior circulation: Mild atherosclerotic calcification in the cavernous carotid bilaterally without stenosis. Anterior and middle cerebral arteries widely patent without stenosis or large vessel occlusion. Posterior circulation: Both vertebral arteries patent to the basilar. PICA patent bilaterally. Basilar widely patent. Superior cerebellar and posterior cerebral arteries normal bilaterally. Venous sinuses: Normal venous enhancement. Anatomic variants: None Review of the MIP images confirms the above findings IMPRESSION: 1. Negative for intracranial  large vessel occlusion. 2. Atherosclerotic disease carotid bifurcation with less than 50% diameter stenosis bilaterally. Both vertebral arteries widely patent. 3. These results were called by telephone at the time of interpretation on 05/09/2020 at 2:35 pm to provider [facility] , who verbally acknowledged these results. Electronically Signed   By: Franchot Gallo M.D.   On: 05/09/2020 14:36    My personal review of EKG: pending   Assessment & Plan:    Active Problems:   CORONARY ATHEROSCLEROSIS NATIVE CORONARY ARTERY   GERD   Type 2 diabetes mellitus with vascular disease (HCC)   Essential hypertension, benign   Hyperlipidemia   Hyperlipemia   Diabetic peripheral neuropathy (HCC)   Subdural hematoma (HCC)   Subdural hematoma -No clear etiology, wife denies patient having any falls. -With significant receptive  aphasia. -Continue to hold aspirin and Plavix. -ED discussed with neurosurgery at [facility] Dr. Ronnald Ramp, so far no recommendation for surgical intervention. -Patient will be admitted to [facility] for close follow-up by neurology discussed with teleneuro, will continue with neurochecks every hour, will repeat CT head if significant change in mental status. -We will start on Keppra per neurology recommendation, as discussed with teleneuro likely will need EEG to rule out any seizures as the location of subdural hematoma might provoke seizures. -We will consult PT/OT/SLP -SCD for DVT prophylaxis -As needed blood pressure medicine to keep blood pressure <140 per neurology recommendation. -Keep platelet 100 K, keep INR><1.4 per neurology recommendation   coronary artery disease/history of CVA -will hold aspirin and Plavix due to above .  type 2 diabetes mellitus - will start lower dose lantus at 20 units(down from 40) - will add insulin sliding scale.  tobacco abuse -will start nicotine patch  GERD -continue PPI  essential hypertension -Keep on as needed hydralazine to keep blood pressure less than 140.   DVT Prophylaxis SCDs  AM Labs Ordered, also please review Full Orders  Family Communication: Admission, patients condition and plan of care including tests being ordered have been discussed with the patient and wife at bedside,  who indicate understanding and agree with the plan and Code Status.  Code Status Full  Likely DC to  home  Condition GUARDED   Consults called: tele neuro , ED D/W   Admission status: inpatient  Time spent in minutes : 60 minutes   Phillips Climes M.D on 05/09/2020 at 3:48 PM   [facility] - Office  [phone]

## 2020-05-09 NOTE — ED Notes (Signed)
MD made aware of pts pressure. 157/96 is the new reading after 10mg  of apresoline was given.

## 2020-05-09 NOTE — ED Notes (Signed)
MD at bedside. 

## 2020-05-09 NOTE — ED Provider Notes (Signed)
[facility] EMERGENCY DEPARTMENT Provider Note   CSN: [id number] Arrival date & time: 05/09/20  1336  An emergency department physician performed an initial assessment on this suspected stroke patient at 1344.  History Chief Complaint  Patient presents with  . Code Stroke    Nicholas [name] is a 60 y.o. male with a history including diabetes, CAD and prior history of ischemic stroke presenting for evaluation of sudden onset right-sided vision loss along with confusion and difficulty understanding questions as noted by daughter who is now at the bedside.  Patient apparently woke this morning well, went to work and was driving home around 12 30 when he first noticed the vision changes.  He called his daughter and by the time he arrived home he was also having difficulty understanding responding to her questions. She drove him in and has noticed significant worsening confusion during the drive.  He has had no treatment prior to arrival.  He is on Plavix chronically.   Wife now at bedside states he was without complaint when he left the house at 8:30 this am.  He has been complaining of headaches for the past several weeks.  No known head injury.   The history is provided by the patient and a relative (daughter at bedside).       Past Medical History:  Diagnosis Date  . Anal lesion    Anal papilla/tags - external lesion  . Bilateral carpal tunnel syndrome 11/24/2018  . Colon polyp    Hyperplastic rectosigmoid polyp 9/08  . Coronary atherosclerosis of native coronary artery    DES to PLB, BMS to mid and distal RCA, DES to circumflex and distal LAD, 6/10 (5 stents)  . Diabetic peripheral neuropathy ([place]) 11/24/2018  . GERD (gastroesophageal reflux disease)   . Helicobacter pylori (H. pylori) 09/2009   Treated with helidac  . Hiatal hernia   . Hyperplastic colon polyp 2008  . Mixed hyperlipidemia   . Myocardial infarction ([place]) 06/2008  . Stroke [facility])    January 2018  . Type 2  diabetes mellitus [facility])     Patient Active Problem List   Diagnosis Date Noted  . Polypharmacy 06/17/2019  . Encounter for screening colonoscopy 06/17/2019  . Vitamin D deficiency 05/10/2019  . Diabetic peripheral neuropathy ([place]) 11/24/2018  . Bilateral carpal tunnel syndrome 11/24/2018  . Acute ischemic stroke ([place]) 03/03/2018  . Uncontrolled type 2 diabetes mellitus with hyperglycemia ([place]) 04/21/2017  . Hyperlipemia 04/21/2016  . Malnutrition of moderate degree 02/11/2016  . Acute CVA (cerebrovascular accident) ([place]) 02/11/2016  . TIA (transient ischemic attack) 02/09/2016  . Right carotid bruit 02/09/2016  . Personal history of noncompliance with medical treatment, presenting hazards to health 02/09/2016  . Type 2 diabetes mellitus with vascular disease ([place]) 12/12/2014  . Essential hypertension, benign 12/12/2014  . Hyperlipidemia 12/12/2014  . 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 TOBACCO ABUSE 07/26/2008  . CORONARY ATHEROSCLEROSIS NATIVE CORONARY ARTERY 07/26/2008  . GERD 07/25/2008  . BACK PAIN, CHRONIC 07/25/2008    Past Surgical History:  Procedure Laterality Date  . CHOLECYSTECTOMY    . COLLAPSED LUNG     15 years ago  . COLONOSCOPY  10/12/2006   Dr. Gala Romney- rectosigmoid polyp s/phot snare polypectomy o/w normal rectum and terminal ileum. hyperplastic polyp on bx  . COLONOSCOPY WITH PROPOFOL N/A 09/01/2019   Procedure: COLONOSCOPY WITH PROPOFOL;  Surgeon: Daneil Dolin, MD;  Location[facility];  Service: Endoscopy;  Laterality: N/A;  8:30am  . EP IMPLANTABLE DEVICE  N/A 02/12/2016   Procedure: Loop Recorder Insertion;  Surgeon: Thompson Grayer, MD;  Location: [place] CV LAB;  Service: Cardiovascular;  Laterality: N/A;  . ESOPHAGOGASTRODUODENOSCOPY  10/12/2006   Dr. Gala Romney- examination of the tubular esophagus revealed no mucosal abnormalities. the EG junction was easily traversed. small hiatal hernia, the gastric mucosa o/w appeared normal. there was no infiltrating  process or frank ulcer seen.  . implantable loop recorder removal  09/29/2019   MDT Reveal LINQ removed in office by Dr Rayann Heman  . TEE WITHOUT CARDIOVERSION N/A 02/12/2016   Procedure: TRANSESOPHAGEAL ECHOCARDIOGRAM (TEE);  Surgeon: Jerline Pain, MD;  Location: [facility] ENDOSCOPY;  Service: Cardiovascular;  Laterality: N/A;  . WIRE IN APEX OF RIGHT LUNG     Since childhood       Family History  Problem Relation Age of Onset  . Diabetes Mother   . Heart disease Father   . Cancer Brother   . CVA Neg Hx     Social History   Tobacco Use  . Smoking status: Current Every Day Smoker    Packs/day: 0.50    Years: 34.00    Pack years: 17.00    Types: Cigarettes    Start date: 04/08/1973  . Smokeless tobacco: Never Used  Vaping Use  . Vaping Use: Never used  Substance Use Topics  . Alcohol use: No    Alcohol/week: 0.0 standard drinks  . Drug use: No    Home Medications Prior to Admission medications   Medication Sig Start Date End Date Taking? Authorizing Provider  albuterol (VENTOLIN HFA) 108 (90 Base) MCG/ACT inhaler Inhale 1-2 puffs into the lungs every 6 (six) hours as needed for wheezing or shortness of breath.    [provider]  aspirin EC 325 MG tablet Take 325 mg by mouth daily.    [provider]  BD PEN NEEDLE NANO U/F 32G X 4 MM MISC USE AS DIRECTED FOUR TIMES DAILY. 08/31/17   Cassandria Anger, MD  clopidogrel (PLAVIX) 75 MG tablet Take 1 tablet by mouth once daily 10/24/19   Satira Sark, MD  Continuous Blood Gluc Receiver (FREESTYLE LIBRE 2 READER) DEVI As directed 12/16/19   Cassandria Anger, MD  Continuous Blood Gluc Sensor (FREESTYLE LIBRE 2 SENSOR) MISC 1 Piece by Does not apply route every 14 (fourteen) days. 05/08/20   Cassandria Anger, MD  gabapentin (NEURONTIN) 100 MG capsule Take 100 mg by mouth daily.  01/29/19   [provider]  LANTUS SOLOSTAR 100 UNIT/ML Solostar Pen INJECT 40 UNITS SUBCUTANEOUSLY ONCE DAILY 05/02/20    Cassandria Anger, MD  nitroGLYCERIN (NITROSTAT) 0.4 MG SL tablet DISSOLVE 1 TABLET UNDER TONGUE EVERY 5 MINUTES UP TO 15 MIN FOR CHESTPAIN. IF NO RELIEF CALL 911. 05/17/19   Satira Sark, MD  pantoprazole (PROTONIX) 40 MG tablet Take 40 mg by mouth daily.  04/11/10   [provider]  SUPREP BOWEL PREP KIT 17.5-3.13-1.6 GM/177ML SOLN Take 354 mLs by mouth once. 08/03/19   [provider]  traMADol (ULTRAM) 50 MG tablet Take 1 tablet (50 mg total) by mouth every 6 (six) hours as needed for moderate pain. 02/12/16   Geradine Girt, DO    Allergies    Patient has no known allergies.  Review of Systems   Review of Systems  Unable to perform ROS: Mental status change  Eyes: Positive for visual disturbance.  Neurological:       Receptive aphasia    Physical Exam  Updated Vital Signs BP 135/87   Pulse 99   Temp 98 F (36.7 C) (Oral)   Resp 19   SpO2 100%   Physical Exam Vitals and nursing note reviewed.  Constitutional:      Appearance: Normal appearance. He is well-developed.  HENT:     Head: Normocephalic and atraumatic.     Mouth/Throat:     Mouth: Mucous membranes are moist.  Eyes:     Extraocular Movements: Extraocular movements intact.  Cardiovascular:     Rate and Rhythm: Normal rate and regular rhythm.     Heart sounds: Normal heart sounds.  Pulmonary:     Effort: Pulmonary effort is normal.     Breath sounds: Normal breath sounds. No wheezing.  Abdominal:     General: Bowel sounds are normal.     Palpations: Abdomen is soft.     Tenderness: There is no abdominal tenderness.  Musculoskeletal:        General: Normal range of motion.     Cervical back: Normal range of motion.  Skin:    General: Skin is warm and dry.  Neurological:     Mental Status: He is alert.     GCS: GCS eye subscore is 4. GCS verbal subscore is 4. GCS motor subscore is 6.     Sensory: Sensation is intact.     Comments: Negative pronator drift.  Moves all 4  extremities.  Difficulty understanding questions asked.     ED Results / Procedures / Treatments   Labs (all labs ordered are listed, but only abnormal results are displayed) Labs Reviewed  COMPREHENSIVE METABOLIC PANEL - Abnormal; Notable for the following components:      Result Value   Glucose, Bld 194 (*)    All other components within normal limits  URINALYSIS, ROUTINE W REFLEX MICROSCOPIC - Abnormal; Notable for the following components:   Specific Gravity, Urine 1.031 (*)    Glucose, UA 150 (*)    All other components within normal limits  CBG MONITORING, ED - Abnormal; Notable for the following components:   Glucose-Capillary 223 (*)    All other components within normal limits  RESP PANEL BY RT-PCR (FLU A&B, COVID) ARPGX2  ETHANOL  PROTIME-INR  APTT  CBC  DIFFERENTIAL  RAPID URINE DRUG SCREEN, HOSP PERFORMED  I-STAT CHEM 8, ED    EKG EKG Interpretation  Date/Time:  Wednesday May 09 2020 14:32:40 EDT Ventricular Rate:  97 PR Interval:  157 QRS Duration: 112 QT Interval:  355 QTC Calculation: 451 R Axis:   33 Text Interpretation: Sinus rhythm Incomplete right bundle branch block Baseline wander in lead(s) V3 since last tracing no significant change Confirmed by Noemi Chapel [phone]) on 05/09/2020 4:30:26 PM   Radiology CT HEAD CODE STROKE WO CONTRAST  Result Date: 05/09/2020 CLINICAL DATA:  Code stroke. Acute neuro deficit. Confusion slurred speech. EXAM: CT HEAD WITHOUT CONTRAST TECHNIQUE: Contiguous axial images were obtained from the base of the skull through the vertex without intravenous contrast. COMPARISON:  CT head 03/03/2018 FINDINGS: Brain: Bilateral extra-axial fluid collections left greater than right. These have intermediate density, slightly above CSF in likely are areas of subacute subdural hematoma. No high-density hemorrhage. Mild mass-effect on the left cerebral hemisphere. Left-sided fluid collection measures 8 mm in thickness and right-sided  fluid collection measures 5 mm in thickness. Ventricle size normal. No midline shift. Negative for acute infarct or mass. Vascular: Negative for hyperdense vessel Skull: Negative Sinuses/Orbits: Mucosal edema paranasal sinuses.  Negative orbit Other:  None ASPECTS ([place] Stroke Program Early CT Score) - Ganglionic level infarction (caudate, lentiform nuclei, internal capsule, insula, M1-M3 cortex): 7 - Supraganglionic infarction (M4-M6 cortex): 3 Total score (0-10 with 10 being normal): 10 IMPRESSION: 1. Bilateral intermediate density subdural collections left greater than right. 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 No high-density hemorrhage. Mild mass-effect on the left cerebral hemisphere without midline shift. 2. ASPECTS is 10 3. These results were called by telephone at the time of interpretation on 05/09/2020 at 2:03 pm to provider [facility] , who verbally acknowledged these results. Electronically Signed   By: Franchot Gallo M.D.   On: 05/09/2020 14:04   CT ANGIO HEAD CODE STROKE  Result Date: 05/09/2020 CLINICAL DATA:  Stroke/TIA, assess intracranial arteries. Patient confusion, slurred speech, vision loss. EXAM: CT ANGIOGRAPHY HEAD TECHNIQUE: Multidetector CT imaging of the head was performed using the standard protocol during bolus administration of intravenous contrast. Multiplanar CT image reconstructions and MIPs were obtained to evaluate the vascular anatomy. CONTRAST:  58m OMNIPAQUE IOHEXOL 350 MG/ML SOLN COMPARISON:  Same day CT head.  MRA March 03, 2018. FINDINGS: Anterior circulation: Bilateral intracranial internal carotid arteries, middle cerebral arteries, and anterior cerebral arteries are patent without evidence of proximal hemodynamically significant stenosis. Limited evaluation of the distal vasculature due to venous contamination. Posterior circulation: Bilateral intradural vertebral arteries, basilar artery and posterior cerebral arteries are patent proximally. No evidence of proximal  hemodynamically significant stenosis. Limited evaluation of the distal vessels due to venous contamination. Venous sinuses: As permitted by contrast timing, patent. Small left transverse and sigmoid sinuses. Review of the MIP images confirms the above findings. IMPRESSION: No evidence of large vessel occlusion or proximal hemodynamically significant stenosis. Limited evaluation of the distal vasculature due to venous contamination. Electronically Signed   By: FMargaretha SheffieldMD   On: 05/09/2020 14:41   CT ANGIO NECK CODE STROKE  Result Date: 05/09/2020 CLINICAL DATA:  Acute neuro deficit. Slurred speech. Headache 3 weeks. EXAM: CT ANGIOGRAPHY HEAD AND NECK TECHNIQUE: Multidetector CT imaging of the head and neck was performed using the standard protocol during bolus administration of intravenous contrast. Multiplanar CT image reconstructions and MIPs were obtained to evaluate the vascular anatomy. Carotid stenosis measurements (when applicable) are obtained utilizing NASCET criteria, using the distal internal carotid diameter as the denominator. CONTRAST:  778mOMNIPAQUE IOHEXOL 350 MG/ML SOLN COMPARISON:  CT head 05/09/2020 FINDINGS: CTA NECK FINDINGS Aortic arch: Standard branching. Imaged portion shows no evidence of aneurysm or dissection. No significant stenosis of the major arch vessel origins. Right carotid system: Atherosclerotic calcification right carotid bifurcation. Approximately 45% diameter stenosis proximal right internal carotid artery due to calcific plaque. Left carotid system: Mild atherosclerotic calcification left carotid bifurcation without significant stenosis. Vertebral arteries: Normal vertebral arteries bilaterally. Skeleton: Mild cervical spondylosis.  No acute skeletal abnormality. Other neck: Negative for mass or edema in the neck. Upper chest: Lung apices clear bilaterally. Review of the MIP images confirms the above findings CTA HEAD FINDINGS Anterior circulation: Mild  atherosclerotic calcification in the cavernous carotid bilaterally without stenosis. Anterior and middle cerebral arteries widely patent without stenosis or large vessel occlusion. Posterior circulation: Both vertebral arteries patent to the basilar. PICA patent bilaterally. Basilar widely patent. Superior cerebellar and posterior cerebral arteries normal bilaterally. Venous sinuses: Normal venous enhancement. Anatomic variants: None Review of the MIP images confirms the above findings IMPRESSION: 1. Negative for intracranial large vessel occlusion. 2. Atherosclerotic disease carotid bifurcation with less than 50% diameter stenosis bilaterally. Both vertebral arteries widely patent. 3.  These results were called by telephone at the time of interpretation on 05/09/2020 at 2:35 pm to provider [facility] , who verbally acknowledged these results. Electronically Signed   By: Franchot Gallo M.D.   On: 05/09/2020 14:36    Procedures Procedures    CRITICAL CARE Performed by: Evalee Jefferson Total critical care time: 45 minutes Critical care time was exclusive of separately billable procedures and treating other patients. Critical care was necessary to treat or prevent imminent or life-threatening deterioration. Critical care was time spent personally by me on the following activities: development of treatment plan with patient and/or surrogate as well as nursing, discussions with consultants, evaluation of patient's response to treatment, examination of patient, obtaining history from patient or surrogate, ordering and performing treatments and interventions, ordering and review of laboratory studies, ordering and review of radiographic studies, pulse oximetry and re-evaluation of patient's condition.   Medications Ordered in ED Medications  levETIRAcetam (KEPPRA) tablet 500 mg (has no administration in time range)  levETIRAcetam (KEPPRA) IVPB 1500 mg/ 100 mL premix (0 mg Intravenous Stopped 05/09/20 1450)   iohexol (OMNIPAQUE) 350 MG/ML injection 75 mL (75 mLs Intravenous Contrast Given 05/09/20 1423)    ED Course  I have reviewed the triage vital signs and the nursing notes.  Pertinent labs & imaging results that were available during my care of the patient were reviewed by me and considered in my medical decision making (see chart for details).    MDM Rules/Calculators/A&P                          Pt with bilateral subdural hematomas with acute right sided vision loss and receptive aphasia, last known well 8:30 am today.  Code stroke initiated and pt was evaluated by Dr. Lorrin Goodell.  Ct angio added to rule out LVO which was negative.  He has added keppra to help prevent seizure, recommends neurosurgical consult for consideration of surgical intervention.  Consult placed and discussed with Dr. Ronnald Ramp of neurosurgery who reviewed CT imaging - no surgical intervention recommended at this time.   Discussed with [facility] Dr. Waldron Labs who accepts pt for admission/will need transfer to [facility] for neurology follow.  Final Clinical Impression(s) / ED Diagnoses Final diagnoses:  Subdural hematoma ([place])  Receptive aphasia    Rx / DC Orders ED Discharge Orders    None       Landis Martins 05/09/20 1630    Lajean Saver, MD 05/10/20 1710

## 2020-05-09 NOTE — ED Notes (Addendum)
Pt in CT at this time.

## 2020-05-09 NOTE — ED Notes (Signed)
Patient transported to CT 

## 2020-05-09 NOTE — Consult Note (Addendum)
[facility] TELEMEDICINE  CONSULT   Date of service: May 09, 2020 Patient Name: Nicholas [name] MRN:  [id number] DOB:  1960-12-22 Requesting Provider: Lajean Saver Location of the provider: [facility] Neurohospitalist Office Location of the patient: [facility] ED Reason for consult: "stroke code"  This consult was provided via telemedicine with 2-way video and audio communication. The patient/family was informed that care would be provided in this way and agreed to receive care in this manner.    History of Present Illness  Nicholas [name] is a 60 y.o. male with PMH significant for prior hs of L occipital stroke with some hemorrhage in Feb 2020 with very little preipheral vision deficit on the right, hx of GERD, DM2, MI, HLD, peripheral neuropathy who presents with acute onset confusion.  Wife and daughter at the bedside provide most of the hx. Patient is unable to provide any hx. He has been reporting headache for the last couple of weeks, worse with bending over then getting up. Last seen at his baseline on 0820 when wife left for work. He called his son in law at 46 about having trouble with vision on the Right that is worse than baseline. He was seen by daughter at 72 and was able to talk to her. They left for hospital at 1310. Enroute, he was having trouble identifying family, stumbling, did not know what to dowith the mask when he got to the hospital   In the ED, stroke code was called for concern for aphasia. On my evaluation, he is tracking RNs in the room briefly. Unable to follow commands when asked but does attempt to mimic when shown what to do. Able to keep all extremities up in the air when held up antigravity.  Workup with CTH w/o contrast with BL subdural hematomas. Family reports that he has been having headches for the last 2-3 weeks.  CTA with no LVO on my read. Per RN, he was able to follow some commands for them in the CT Scanner.   Stroke Measures    Last Known Well: 0820 on 05/09/20 TPA Given: No, he has BL SDH, he has a hx of hemorrhage in the prior left occipital stroke. IR Thrombectomy: No LVO mRS: Modified Rankin Scale: 0-Completely asymptomatic and back to baseline post- stroke Time of teleneurologist evaluation: 1354   Vitals   Vitals:   05/09/20 1342 05/09/20 1345 05/09/20 1347 05/09/20 1400  BP: (!) 147/99 (!) 145/90 (!) 149/92 137/85  Pulse:  (!) 101 100 (!) 101  Resp:  14  (!) 21  SpO2:  99% 99% 98%     There is no height or weight on file to calculate BMI.   Tele-neuro Exam and NIHSS   General:  NIHSS components Score: Comment  1a Level of Conscious 0[x]  1[]  2[]  3[]      1b LOC Questions 0[]  1[]  2[x]       1c LOC Commands 0[]  1[]  2[x]       2 Best Gaze 0[x]  1[]  2[]       3 Visual 0[]  1[x]  2[]  3[]      4 Facial Palsy 0[x]  1[]  2[]  3[]      5a Motor Arm - left 0[x]  1[]  2[]  3[]  4[]  UN[]    5b Motor Arm - Right 0[x]  1[]  2[]  3[]  4[]  UN[]    6a Motor Leg - Left 0[x]  1[]  2[]  3[]  4[]  UN[]    6b Motor Leg - Right 0[x]  1[]  2[]  3[]  4[]  UN[]    7 Limb Ataxia 0[x]  1[]  2[]   3[]  UN[]     8 Sensory 0[x]  1[]  2[]  UN[]      9 Best Language 0[]  1[]  2[x]  3[]      10 Dysarthria 0[x]  1[]  2[]  UN[]      11 Extinct. and Inattention 0[x]  1[]  2[]       TOTAL: 7    Imaging and Labs   CBC:  Recent Labs  Lab 05/09/20 1347  WBC 10.5  NEUTROABS 7.6  HGB 14.9  HCT 45.3  MCV 83.6  PLT 643    Basic Metabolic Panel:  Lab Results  Component Value Date   NA 140 05/02/2019   K 4.8 05/02/2019   CO2 28 05/02/2019   GLUCOSE 215 (H) 05/02/2019   BUN 17 05/02/2019   CREATININE 0.78 05/02/2019   CALCIUM 9.6 05/02/2019   GFRNONAA 99 10/20/2018   GFRAA 115 10/20/2018   Lipid Panel:  Lab Results  Component Value Date   LDLCALC 88 09/02/2018   HgbA1c:  Lab Results  Component Value Date   HGBA1C 7.9 (A) 04/30/2020   Urine Drug Screen:     Component Value Date/Time   LABOPIA NONE DETECTED 03/03/2018 1430   COCAINSCRNUR NONE DETECTED  03/03/2018 1430   LABBENZ NONE DETECTED 03/03/2018 1430   AMPHETMU NONE DETECTED 03/03/2018 1430   THCU NONE DETECTED 03/03/2018 1430   LABBARB NONE DETECTED 03/03/2018 1430    Alcohol Level     Component Value Date/Time   ETH <10 05/09/2020 1347    Results for orders placed during the hospital encounter of 05/09/20  CT HEAD CODE STROKE WO CONTRAST  Narrative CLINICAL DATA:  Code stroke. Acute neuro deficit. Confusion slurred speech.  EXAM: CT HEAD WITHOUT CONTRAST 1. Bilateral intermediate density subdural collections left greater than right. 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 No high-density hemorrhage. Mild mass-effect on the left cerebral hemisphere without midline shift. 2. ASPECTS is 10 3. These results were called by telephone at the time of interpretation on 05/09/2020 at 2:03 pm to provider [facility] , who verbally acknowledged these results.  CT Angio head and Neck: No LVO on my read.  Impression   Nicholas [name] is a 60 y.o. male with PMH significant for L occipital stroke with some hemorrhage in Feb 2020 with very little preipheral vision deficit on the right, hx of GERD, DM2, MI, HLD, peripheral neuropathy who presents with acute onset confusion and worsening of baseline R vision loss. LKW 0820 on 05/09/20. His limited tele-neurologic examination is notable for aphasia with word salad, R vision deficit. CTH with BL intermediate density SDH. CTA with no LVO.  SDH likely explains the presentation. Vitals, glucose were normal with normal O2 sats and unlikely to explain his presentation. No movement observed concerning for a seizure but he could have a focal seizure presenting with epileptic aphasia.  Recommendations  - Keppra 1500mg  IV once. - Keppra 500mg  BID x 1 week - SBP < 140, INR < 1.4, Platelet > 100. - Recommend discussion with Neurosurgery team. - recommend routine EEG ______________________________________________________________________  Update 3295:  Notified by ED team about no Neurosurgical intervention per Neurosurgery team. He is being transferred to [facility] and admitted to Hospitalist service. Recommend calling Neurology service when the patient gets there for assessment to see if he would benefit from a cEEG overnight.  This patient is receiving care for possible acute neurological changes. There was 40 minutes of care by this provider at the time of service, including time for direct evaluation via telemedicine, review of medical records, imaging studies and discussion  of findings with providers, the patient and/or family.  Donnetta Simpers [facility] Pager Number [phone]  If 7pm- 7am, please page neurology on call as listed in [place].

## 2020-05-09 NOTE — Progress Notes (Signed)
Code Stroke Time Documentation   5927 Call Time [place] Exam Started  6394 Exam Finished 3200 Images sent to [facility] 1356 Exam completed in [place] radiology called

## 2020-05-10 ENCOUNTER — Inpatient Hospital Stay (HOSPITAL_COMMUNITY): Payer: BC Managed Care – PPO

## 2020-05-10 ENCOUNTER — Other Ambulatory Visit: Payer: Self-pay

## 2020-05-10 DIAGNOSIS — R4701 Aphasia: Secondary | ICD-10-CM

## 2020-05-10 LAB — GLUCOSE, CAPILLARY
Glucose-Capillary: 135 mg/dL — ABNORMAL HIGH (ref 70–99)
Glucose-Capillary: 136 mg/dL — ABNORMAL HIGH (ref 70–99)
Glucose-Capillary: 141 mg/dL — ABNORMAL HIGH (ref 70–99)
Glucose-Capillary: 149 mg/dL — ABNORMAL HIGH (ref 70–99)
Glucose-Capillary: 156 mg/dL — ABNORMAL HIGH (ref 70–99)
Glucose-Capillary: 170 mg/dL — ABNORMAL HIGH (ref 70–99)
Glucose-Capillary: 181 mg/dL — ABNORMAL HIGH (ref 70–99)

## 2020-05-10 LAB — HIV ANTIBODY (ROUTINE TESTING W REFLEX): HIV Screen 4th Generation wRfx: NONREACTIVE

## 2020-05-10 MED ORDER — LORAZEPAM 2 MG/ML IJ SOLN
1.0000 mg | Freq: Once | INTRAMUSCULAR | Status: AC | PRN
  Administered 2020-05-10: 1 mg via INTRAVENOUS
  Filled 2020-05-10: qty 1

## 2020-05-10 MED ORDER — STROKE: EARLY STAGES OF RECOVERY BOOK
Status: AC
  Filled 2020-05-10: qty 1

## 2020-05-10 MED ORDER — NICOTINE 14 MG/24HR TD PT24
14.0000 mg | MEDICATED_PATCH | Freq: Every day | TRANSDERMAL | Status: DC
  Administered 2020-05-10 – 2020-05-12 (×3): 14 mg via TRANSDERMAL
  Filled 2020-05-10 (×3): qty 1

## 2020-05-10 NOTE — Plan of Care (Signed)
  Problem: Self-Care: Goal: Ability to participate in self-care as condition permits will improve Outcome: Progressing Goal: Ability to communicate needs accurately will improve Outcome: Progressing   Problem: Intracerebral Hemorrhage Tissue Perfusion: Goal: Complications of Intracerebral Hemorrhage will be minimized Outcome: Progressing   Problem: Ischemic Stroke/TIA Tissue Perfusion: Goal: Complications of ischemic stroke/TIA will be minimized Outcome: Progressing   Problem: Spontaneous Subarachnoid Hemorrhage Tissue Perfusion: Goal: Complications of Spontaneous Subarachnoid Hemorrhage will be minimized Outcome: Progressing

## 2020-05-10 NOTE — Progress Notes (Signed)
EEG electrodes removed for MRI - no skin breakdown.

## 2020-05-10 NOTE — Evaluation (Signed)
Occupational Therapy Evaluation Patient Details Name: Nicholas [name] MRN: [id number] DOB: 1960/05/10 Today's Date: 05/10/2020    History of Present Illness Nicholas [name] is a 60 y.o. male with PMH significant for significant for prior hs of L occipital stroke with some hemorrhage in Feb 2020 with very little preipheral vision deficit on the right, hx of GERD, DM2, MI, HLD, peripheral neuropathy who is transferred  From [facility] for further care after presenting as stroke code with aphasia and found to have left greater than right bilateral subdural hematoma.   Clinical Impression   Pt admitted with above. He demonstrates the below listed deficits and will benefit from continued OT to maximize safety and independence with BADLs.  Pt lethargic during OT eval. He presents to OT with communication deficits which make it difficult to accurately assess cognition, as well as generalized weakness.   He aroused with cues, and was able to move to EOB sitting with min guard assist.  He requires min guard assist - mod A for ADLs.  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  Recommend CIR level rehab.       Follow Up Recommendations  CIR    Equipment Recommendations       Recommendations for Other Services Rehab consult     Precautions / Restrictions Precautions Precautions: Fall      Mobility Bed Mobility Overal bed mobility: Needs Assistance Bed Mobility: Supine to Sit;Sit to Supine     Supine to sit: Min guard Sit to supine: Min guard   General bed mobility comments: min guard for safety    Transfers                 General transfer comment: did not attempt due to pt hooked up to LT EEG    Balance Overall balance assessment: Needs assistance Sitting-balance support: Feet supported Sitting balance-Leahy Scale: Fair     Standing balance support: No upper extremity supported Standing  balance-Leahy Scale: Poor                             ADL either performed or assessed with clinical judgement   ADL Overall ADL's : Needs assistance/impaired Eating/Feeding: Minimal assistance;Sitting   Grooming: Wash/dry hands;Wash/dry face;Oral care;Brushing hair;Min guard;Sitting Grooming Details (indicate cue type and reason): Pt identifies toothbrush as a pencil, but is able to describe how to use it Upper Body Bathing: Minimal assistance;Sitting   Lower Body Bathing: Minimal assistance;Sit to/from stand   Upper Body Dressing : Moderate assistance;Sitting   Lower Body Dressing: Minimal assistance;Sit to/from stand Lower Body Dressing Details (indicate cue type and reason): able to don/doff socks while seated EOB with supervision Toilet Transfer: Minimal assistance;Stand-pivot;BSC   Toileting- Clothing Manipulation and Hygiene: Minimal assistance;Sit to/from stand       Functional mobility during ADLs: Minimal assistance       Vision   Additional Comments: Difficult to accurately assess due to impaired communication and pt lethargy     Perception     Praxis Praxis Praxis tested?: Deficits Deficits: Perseveration Praxis-Other Comments: verbal perseveration noted    Pertinent Vitals/Pain Pain Assessment: Faces Faces Pain Scale: No hurt     Hand Dominance Right   Extremity/Trunk Assessment Upper Extremity Assessment Upper Extremity Assessment: Generalized weakness   Lower Extremity Assessment Lower Extremity Assessment: Defer to PT evaluation   Cervical / Trunk Assessment Cervical /  Trunk Assessment: Normal   Communication Communication Communication: Receptive difficulties;Expressive difficulties   Cognition Arousal/Alertness: Lethargic Behavior During Therapy: Flat affect Overall Cognitive Status: Difficult to assess                                 General Comments: Pt follows simple commands with gestural cues ~70% of the  time.   General Comments  wife and daughter present    Exercises     Shoulder Instructions      Home Living Family/patient expects to be discharged to:: Private residence Living Arrangements: Spouse/significant other Available Help at Discharge: Family Type of Home: House Home Access: Stairs to enter Technical brewer of Steps: 8 Entrance Stairs-Rails: Left Home Layout: One level     Bathroom Shower/Tub: Teacher, early years/pre: Standard Bathroom Accessibility: Yes   Home Equipment: None          Prior Functioning/Environment Level of Independence: Independent        Comments: Pt was fully independent PTA, drives, 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, 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        OT Problem List: Decreased activity tolerance;Impaired balance (sitting and/or standing);Impaired vision/perception;Decreased cognition;Decreased safety awareness      OT Treatment/Interventions: Self-care/ADL training;Neuromuscular education;DME and/or AE instruction;Therapeutic activities;Cognitive remediation/compensation;Visual/perceptual remediation/compensation;Patient/family education;Balance training    OT Goals(Current goals can be found in the care plan section) Acute Rehab OT Goals Patient Stated Goal: for pt to get better OT Goal Formulation: With patient/family Time For Goal Achievement: 05/24/20 Potential to Achieve Goals: Good ADL Goals Pt Will Perform Grooming: with supervision;standing Pt Will Perform Upper Body Bathing: with set-up;with supervision;sitting Pt Will Perform Lower Body Bathing: with supervision;sit to/from stand Pt Will Perform Upper Body Dressing: with supervision;sitting Pt Will Perform Lower Body Dressing: with supervision;sit to/from stand Pt Will Transfer to Toilet: with supervision;ambulating;regular height toilet;bedside commode;grab bars Pt Will Perform Toileting - Clothing Manipulation  and hygiene: with supervision;sit to/from stand  OT Frequency: Min 2X/week   Barriers to D/C:            Co-evaluation              AM-PAC OT "6 Clicks" Daily Activity     Outcome Measure Help from another person eating meals?: A Little Help from another person taking care of personal grooming?: A Little Help from another person toileting, which includes using toliet, bedpan, or urinal?: A Little Help from another person bathing (including washing, rinsing, drying)?: A Little Help from another person to put on and taking off regular upper body clothing?: A Lot Help from another person to put on and taking off regular lower body clothing?: A Little 6 Click Score: 17   End of Session Nurse Communication: Mobility status  Activity Tolerance: Patient limited by lethargy Patient left: in bed;with call bell/phone within reach;with bed alarm set;with family/visitor present  OT Visit Diagnosis: Unsteadiness on feet (R26.81);Cognitive communication deficit (R41.841) Symptoms and signs involving cognitive functions: Nontraumatic SAH                Time: 5885-0277 OT Time Calculation (min): 22 min Charges:  OT General Charges $OT Visit: 1 Visit OT Evaluation $OT Eval Moderate Complexity: 1 Mod  Nilsa Nutting., OTR/L Acute Rehabilitation Services Pager [phone] Office [place], [place] 05/10/2020, 4:42 PM
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
## 2020-05-10 NOTE — Consult Note (Signed)
Physical Medicine and Rehabilitation Consult Reason for Consult: Fluctuating aphasia with decreased functional mobility Referring Physician: Dr. Maren Beach   HPI: Nicholas [name] is a 60 y.o. right-handed male with history of hypertension, hyperlipidemia, CAD status post DES, type 2 diabetes mellitus, tobacco abuse, left occipital stroke February 2020 maintained on aspirin and Plavix with peripheral vision deficit on the right.  Per chart review patient lives with spouse.  Reportedly independent prior to admission.  1 level home 8 steps to entry.  Presented 05/09/2020 with aphasia as well as reports of headache.  Cranial CT scan showed bilateral intermediate density subdural collections left greater than right.  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  No high density hemorrhage.  Mild mass-effect on the left cerebral hemisphere without midline shift.  CT angiogram head and neck negative for intracranial large vessel occlusion.  Patient did not receive TPA.  MRI identifies small bilateral extra-axial fluid collection compatible with chronic subdural hematoma.  He is on a regular consistency diet.  Therapy evaluations completed due to patient decreased functional ability and aphasia recommendations for physical medicine rehab consult. He would prefer to be d/ced home if possible. Based on yesterday's therapy notes, he would benefit from CIR.    Review of Systems  Constitutional: Negative for chills and fever.  HENT: Negative for hearing loss.   Eyes: Negative for blurred vision and double vision.  Respiratory: Negative for cough and shortness of breath.   Cardiovascular: Negative for chest pain and palpitations.  Gastrointestinal: Positive for constipation. Negative for heartburn, nausea and vomiting.       GERD  Genitourinary: Negative for dysuria, flank pain and hematuria.  Musculoskeletal: Positive for myalgias.  Skin: Negative for rash.  Neurological: Positive for dizziness, speech change,  weakness and headaches.  All other systems reviewed and are negative.  Past Medical History:  Diagnosis Date  . Anal lesion    Anal papilla/tags - external lesion  . Bilateral carpal tunnel syndrome 11/24/2018  . Colon polyp    Hyperplastic rectosigmoid polyp 9/08  . Coronary atherosclerosis of native coronary artery    DES to PLB, BMS to mid and distal RCA, DES to circumflex and distal LAD, 6/10 (5 stents)  . Diabetic peripheral neuropathy ([place]) 11/24/2018  . GERD (gastroesophageal reflux disease)   . Helicobacter pylori (H. pylori) 09/2009   Treated with helidac  . Hiatal hernia   . Hyperplastic colon polyp 2008  . Mixed hyperlipidemia   . Myocardial infarction ([place]) 06/2008  . Stroke [facility])    January 2018  . Type 2 diabetes mellitus ([place])    Past Surgical History:  Procedure Laterality Date  . CHOLECYSTECTOMY    . COLLAPSED LUNG     15 years ago  . COLONOSCOPY  10/12/2006   Dr. Gala Romney- rectosigmoid polyp s/phot snare polypectomy o/w normal rectum and terminal ileum. hyperplastic polyp on bx  . COLONOSCOPY WITH PROPOFOL N/A 09/01/2019   Procedure: COLONOSCOPY WITH PROPOFOL;  Surgeon: Daneil Dolin, MD;  Location: [facility];  Service: Endoscopy;  Laterality: N/A;  8:30am  . EP IMPLANTABLE DEVICE N/A 02/12/2016   Procedure: Loop Recorder Insertion;  Surgeon: Thompson Grayer, MD;  Location: [facility] CV LAB;  Service: Cardiovascular;  Laterality: N/A;  . ESOPHAGOGASTRODUODENOSCOPY  10/12/2006   Dr. Gala Romney- examination of the tubular esophagus revealed no mucosal abnormalities. the EG junction was easily traversed. small hiatal hernia, the gastric mucosa o/w appeared normal. there was no infiltrating process or frank ulcer seen.  . implantable  loop recorder removal  09/29/2019   MDT Reveal LINQ removed in office by Dr Rayann Heman  . TEE WITHOUT CARDIOVERSION N/A 02/12/2016   Procedure: TRANSESOPHAGEAL ECHOCARDIOGRAM (TEE);  Surgeon: Jerline Pain, MD;  Location[facility] ENDOSCOPY;  Service:  Cardiovascular;  Laterality: N/A;  . WIRE IN APEX OF RIGHT LUNG     Since childhood   Family History  Problem Relation Age of Onset  . Diabetes Mother   . Heart disease Father   . Cancer Brother   . CVA Neg Hx    Social History:  reports that he has been smoking cigarettes. He started smoking about 47 years ago. He has a 17.00 pack-year smoking history. He has never used smokeless tobacco. He reports that he does not drink alcohol and does not use drugs. Allergies: No Known Allergies Medications Prior to Admission  Medication Sig Dispense Refill  . albuterol (VENTOLIN HFA) 108 (90 Base) MCG/ACT inhaler Inhale 1-2 puffs into the lungs every 6 (six) hours as needed for wheezing or shortness of breath.    Marland Kitchen aspirin EC 325 MG tablet Take 325 mg by mouth daily.    . clopidogrel (PLAVIX) 75 MG tablet Take 1 tablet by mouth once daily (Patient taking differently: Take 75 mg by mouth daily.) 90 tablet 2  . gabapentin (NEURONTIN) 100 MG capsule Take 100 mg by mouth daily.     Marland Kitchen LANTUS SOLOSTAR 100 UNIT/ML Solostar Pen INJECT 40 UNITS SUBCUTANEOUSLY ONCE DAILY (Patient taking differently: Inject into the skin at bedtime.) 15 mL 0  . nitroGLYCERIN (NITROSTAT) 0.4 MG SL tablet DISSOLVE 1 TABLET UNDER TONGUE EVERY 5 MINUTES UP TO 15 MIN FOR CHESTPAIN. IF NO RELIEF CALL 911. (Patient taking differently: Place 0.4 mg under the tongue every 5 (five) minutes as needed for chest pain.) 25 tablet 3  . pantoprazole (PROTONIX) 40 MG tablet Take 40 mg by mouth daily.    . traMADol (ULTRAM) 50 MG tablet Take 50 mg by mouth daily.    . BD PEN NEEDLE NANO U/F 32G X 4 MM MISC USE AS DIRECTED FOUR TIMES DAILY. 100 each 2  . Continuous Blood Gluc Receiver (FREESTYLE LIBRE 2 READER) DEVI As directed 1 each 0  . Continuous Blood Gluc Sensor (FREESTYLE LIBRE 2 SENSOR) MISC 1 Piece by Does not apply route every 14 (fourteen) days. 2 each 3  . traMADol (ULTRAM) 50 MG tablet Take 1 tablet (50 mg total) by mouth every 6  (six) hours as needed for moderate pain. (Patient not taking: Reported on 05/10/2020) 15 tablet 0    Home: Home Living Family/patient expects to be discharged to:: Private residence Living Arrangements: Spouse/significant other Available Help at Discharge: Family Type of Home: House Home Access: Stairs to enter Technical brewer of Steps: 8 Entrance Stairs-Rails: Left Home Layout: One level Biochemist, clinical: [place]: None  Lives With: Spouse  Functional History: Prior Function Level of Independence: Independent Functional Status:  Mobility: Bed Mobility Overal bed mobility: Needs Assistance Bed Mobility: Supine to Sit Supine to sit: Min guard,HOB elevated General bed mobility comments: assist for balance initially, increased time to initiate Transfers Overall transfer level: Needs assistance Equipment used: None Transfers: Sit to/from Stand Sit to Stand: Min guard,Min assist General transfer comment: assist for balance, safety, initially stood, then sat right back down due to decreased balance/anterior weight shift Ambulation/Gait Ambulation/Gait assistance: Min assist,Mod assist Gait Distance (Feet): 22 Feet Assistive device: None Gait Pattern/deviations: Step-through pattern,Decreased stride length,Staggering right,Staggering left,Narrow base of support General Gait  Details: Unsteady and lumbering, with min to mod A for balance/safety.  Did not want to walk further/outside room, awaiting MRI and still NPO per wife in case needs surgery.    ADL:    Cognition: Cognition Overall Cognitive Status: Impaired/Different from baseline Arousal/Alertness: Lethargic Orientation Level: Oriented to person,Disoriented to place,Disoriented to time,Disoriented to situation Attention: Focused,Sustained Focused Attention: Appears intact Sustained Attention: Impaired Sustained Attention Impairment: Verbal basic Memory: Impaired Memory Impairment: Other (comment)  (per wife short term memory impaired at baseline) Awareness: Impaired Awareness Impairment: Intellectual impairment Cognition Arousal/Alertness: Lethargic Behavior During Therapy: Flat affect Overall Cognitive Status: Impaired/Different from baseline Area of Impairment: Orientation,Following commands,Safety/judgement,Problem solving Orientation Level: Disoriented to,Place,Time,Situation Following Commands: Follows one step commands inconsistently,Follows one step commands with increased time,Follows multi-step commands with increased time Safety/Judgement: Decreased awareness of safety,Decreased awareness of deficits Problem Solving: Slow processing  Blood pressure 111/75, pulse (!) 109, temperature 99.5 F (37.5 C), temperature source Oral, resp. rate 20, height 5\' 9"  (1.753 m), weight 73.3 kg, SpO2 95 %. Physical Exam Gen: no distress, normal appearing HEENT: oral mucosa pink and moist, NCAT Cardio: Tachycardia Chest: normal effort, normal rate of breathing Abd: soft, non-distended Ext: no edema Psych: pleasant, normal affect Skin: intact Neurological:     Comments: Patient is alert and oriented. 5/5 strength except 4/5 RUE.    Results for orders placed or performed during the hospital encounter of 05/09/20 (from the past 24 hour(s))  CBG monitoring, ED     Status: Abnormal   Collection Time: 05/09/20  1:42 PM  Result Value Ref Range   Glucose-Capillary 223 (H) 70 - 99 mg/dL  Ethanol     Status: None   Collection Time: 05/09/20  1:47 PM  Result Value Ref Range   Alcohol, Ethyl (B) <10 <10 mg/dL  Protime-INR     Status: None   Collection Time: 05/09/20  1:47 PM  Result Value Ref Range   Prothrombin Time 13.1 11.4 - 15.2 seconds   INR 1.0 0.8 - 1.2  APTT     Status: None   Collection Time: 05/09/20  1:47 PM  Result Value Ref Range   aPTT 28 24 - 36 seconds  CBC     Status: None   Collection Time: 05/09/20  1:47 PM  Result Value Ref Range   WBC 10.5 4.0 - 10.5 K/uL    RBC 5.42 4.22 - 5.81 MIL/uL   Hemoglobin 14.9 13.0 - 17.0 g/dL   HCT 45.3 39.0 - 52.0 %   MCV 83.6 80.0 - 100.0 fL   MCH 27.5 26.0 - 34.0 pg   MCHC 32.9 30.0 - 36.0 g/dL   RDW 14.0 11.5 - 15.5 %   Platelets 183 150 - 400 K/uL   nRBC 0.0 0.0 - 0.2 %  Differential     Status: None   Collection Time: 05/09/20  1:47 PM  Result Value Ref Range   Neutrophils Relative % 72 %   Neutro Abs 7.6 1.7 - 7.7 K/uL   Lymphocytes Relative 19 %   Lymphs Abs 2.0 0.7 - 4.0 K/uL   Monocytes Relative 7 %   Monocytes Absolute 0.7 0.1 - 1.0 K/uL   Eosinophils Relative 1 %   Eosinophils Absolute 0.1 0.0 - 0.5 K/uL   Basophils Relative 1 %   Basophils Absolute 0.1 0.0 - 0.1 K/uL   Immature Granulocytes 0 %   Abs Immature Granulocytes 0.04 0.00 - 0.07 K/uL  Comprehensive metabolic panel     Status: Abnormal  Collection Time: 05/09/20  1:47 PM  Result Value Ref Range   Sodium 136 135 - 145 mmol/L   Potassium 4.0 3.5 - 5.1 mmol/L   Chloride 102 98 - 111 mmol/L   CO2 24 22 - 32 mmol/L   Glucose, Bld 194 (H) 70 - 99 mg/dL   BUN 10 8 - 23 mg/dL   Creatinine, Ser 0.71 0.61 - 1.24 mg/dL   Calcium 9.0 8.9 - 10.3 mg/dL   Total Protein 7.2 6.5 - 8.1 g/dL   Albumin 4.1 3.5 - 5.0 g/dL   AST 20 15 - 41 U/L   ALT 21 0 - 44 U/L   Alkaline Phosphatase 60 38 - 126 U/L   Total Bilirubin 0.7 0.3 - 1.2 mg/dL   GFR, Estimated >60 >60 mL/min   Anion gap 10 5 - 15  Hemoglobin A1c     Status: Abnormal   Collection Time: 05/09/20  1:47 PM  Result Value Ref Range   Hgb A1c MFr Bld 8.0 (H) 4.8 - 5.6 %   Mean Plasma Glucose 182.9 mg/dL  Resp Panel by RT-PCR (Flu A&B, Covid) Nasopharyngeal Swab     Status: None   Collection Time: 05/09/20  3:00 PM   Specimen: Nasopharyngeal Swab; Nasopharyngeal(NP) swabs in vial transport medium  Result Value Ref Range   SARS Coronavirus 2 by RT PCR NEGATIVE NEGATIVE   Influenza A by PCR NEGATIVE NEGATIVE   Influenza B by PCR NEGATIVE NEGATIVE  Urine rapid drug screen (hosp  performed)     Status: None   Collection Time: 05/09/20  3:01 PM  Result Value Ref Range   Opiates NONE DETECTED NONE DETECTED   Cocaine NONE DETECTED NONE DETECTED   Benzodiazepines NONE DETECTED NONE DETECTED   Amphetamines NONE DETECTED NONE DETECTED   Tetrahydrocannabinol NONE DETECTED NONE DETECTED   Barbiturates NONE DETECTED NONE DETECTED  Urinalysis, Routine w reflex microscopic Urine, Clean Catch     Status: Abnormal   Collection Time: 05/09/20  3:01 PM  Result Value Ref Range   Color, Urine YELLOW YELLOW   APPearance CLEAR CLEAR   Specific Gravity, Urine 1.031 (H) 1.005 - 1.030   pH 6.0 5.0 - 8.0   Glucose, UA 150 (A) NEGATIVE mg/dL   Hgb urine dipstick NEGATIVE NEGATIVE   Bilirubin Urine NEGATIVE NEGATIVE   Ketones, ur NEGATIVE NEGATIVE mg/dL   Protein, ur NEGATIVE NEGATIVE mg/dL   Nitrite NEGATIVE NEGATIVE   Leukocytes,Ua NEGATIVE NEGATIVE  CBG monitoring, ED     Status: Abnormal   Collection Time: 05/09/20  4:54 PM  Result Value Ref Range   Glucose-Capillary 143 (H) 70 - 99 mg/dL  HIV Antibody (routine testing w rflx)     Status: None   Collection Time: 05/09/20  7:15 PM  Result Value Ref Range   HIV Screen 4th Generation wRfx Non Reactive Non Reactive  CBG monitoring, ED     Status: Abnormal   Collection Time: 05/09/20  7:33 PM  Result Value Ref Range   Glucose-Capillary 150 (H) 70 - 99 mg/dL  Glucose, capillary     Status: Abnormal   Collection Time: 05/10/20 12:07 AM  Result Value Ref Range   Glucose-Capillary 181 (H) 70 - 99 mg/dL   Comment 1 Notify RN    Comment 2 Document in Chart   Glucose, capillary     Status: Abnormal   Collection Time: 05/10/20  4:25 AM  Result Value Ref Range   Glucose-Capillary 136 (H) 70 - 99 mg/dL  Comment 1 Notify RN    Comment 2 Document in Chart   Glucose, capillary     Status: Abnormal   Collection Time: 05/10/20  7:26 AM  Result Value Ref Range   Glucose-Capillary 156 (H) 70 - 99 mg/dL   Comment 1 Notify RN     Comment 2 Document in Chart   Glucose, capillary     Status: Abnormal   Collection Time: 05/10/20 11:56 AM  Result Value Ref Range   Glucose-Capillary 170 (H) 70 - 99 mg/dL   Comment 1 Notify RN    Comment 2 Document in Chart    CT HEAD WO CONTRAST  Result Date: 05/09/2020 CLINICAL DATA:  Worsening headache history of subdural hematoma EXAM: CT HEAD WITHOUT CONTRAST TECHNIQUE: Contiguous axial images were obtained from the base of the skull through the vertex without intravenous contrast. COMPARISON:  CT brain 05/09/2020, MRI 03/03/2018 FINDINGS: Brain: No large vessel territorial infarction or intracranial mass. Intermediate density left greater than right bilateral subdural hematomas. This measures up to 10 mm maximum thickness on the left and 6 mm maximum thickness on the right, grossly stable in size when similar measurement performed on exam earlier today. Subdural collections appear slightly more dense as compared with prior, with similar appearance on the post contrasted exam performed earlier today. No midline shift. Mild mass effect on underlying brain parenchyma. No definite new hyperdense blood is seen. The ventricles are stable in size. Vascular: No hyperdense vessels. Scattered carotid vascular calcification. Skull: Normal. Negative for fracture or focal lesion. Sinuses/Orbits: No acute finding. Mucosal disease in the left maxillary sinus. Other: None IMPRESSION: 1. Intermediate density left greater than right bilateral subdural hematomas, grossly stable in size compared with exam earlier today. Subdural collections appears slightly more dense as compared with prior CT probably related to contrast media administration. No midline shift. Electronically Signed   By: Donavan Foil M.D.   On: 05/09/2020 19:04   MR BRAIN WO CONTRAST  Result Date: 05/10/2020 CLINICAL DATA:  Stroke follow-up EXAM: MRI HEAD WITHOUT CONTRAST TECHNIQUE: Multiplanar, multiecho pulse sequences of the brain and  surrounding structures were obtained without intravenous contrast. COMPARISON:  CT head 05/09/2020.  MRI head 03/03/2018 FINDINGS: Brain: Negative for acute infarct. Few small white matter hyperintensities bilaterally. Negative for mass lesion Small bilateral extra-axial fluid collections. These are intermediate density on CT and are hyperdense on FLAIR. This measures approximately 3 mm on the right and 6 mm on the left. Findings compatible with small chronic subdural hemorrhage. Vascular: Normal arterial flow voids Skull and upper cervical spine: No focal skeletal lesion. Sinuses/Orbits: Mucosal edema left maxillary sinus.  Normal orbit Other: None IMPRESSION: Negative for acute infarct Small bilateral extra-axial fluid collection compatible with chronic subdural hematoma. Electronically Signed   By: Franchot Gallo M.D.   On: 05/10/2020 13:08   CT HEAD CODE STROKE WO CONTRAST  Result Date: 05/09/2020 CLINICAL DATA:  Code stroke. Acute neuro deficit. Confusion slurred speech. EXAM: CT HEAD WITHOUT CONTRAST TECHNIQUE: Contiguous axial images were obtained from the base of the skull through the vertex without intravenous contrast. COMPARISON:  CT head 03/03/2018 FINDINGS: Brain: Bilateral extra-axial fluid collections left greater than right. These have intermediate density, slightly above CSF in likely are areas of subacute subdural hematoma. No high-density hemorrhage. Mild mass-effect on the left cerebral hemisphere. Left-sided fluid collection measures 8 mm in thickness and right-sided fluid collection measures 5 mm in thickness. Ventricle size normal. No midline shift. Negative for acute infarct or mass. Vascular:  Negative for hyperdense vessel Skull: Negative Sinuses/Orbits: Mucosal edema paranasal sinuses.  Negative orbit Other: None ASPECTS ([place] Stroke Program Early CT Score) - Ganglionic level infarction (caudate, lentiform nuclei, internal capsule, insula, M1-M3 cortex): 7 - Supraganglionic  infarction (M4-M6 cortex): 3 Total score (0-10 with 10 being normal): 10 IMPRESSION: 1. Bilateral intermediate density subdural collections left greater than right. 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 No high-density hemorrhage. Mild mass-effect on the left cerebral hemisphere without midline shift. 2. ASPECTS is 10 3. These results were called by telephone at the time of interpretation on 05/09/2020 at 2:03 pm to provider [facility] , who verbally acknowledged these results. Electronically Signed   By: Franchot Gallo M.D.   On: 05/09/2020 14:04   CT ANGIO HEAD CODE STROKE  Result Date: 05/09/2020 CLINICAL DATA:  Stroke/TIA, assess intracranial arteries. Patient confusion, slurred speech, vision loss. EXAM: CT ANGIOGRAPHY HEAD TECHNIQUE: Multidetector CT imaging of the head was performed using the standard protocol during bolus administration of intravenous contrast. Multiplanar CT image reconstructions and MIPs were obtained to evaluate the vascular anatomy. CONTRAST:  33mL OMNIPAQUE IOHEXOL 350 MG/ML SOLN COMPARISON:  Same day CT head.  MRA March 03, 2018. FINDINGS: Anterior circulation: Bilateral intracranial internal carotid arteries, middle cerebral arteries, and anterior cerebral arteries are patent without evidence of proximal hemodynamically significant stenosis. Limited evaluation of the distal vasculature due to venous contamination. Posterior circulation: Bilateral intradural vertebral arteries, basilar artery and posterior cerebral arteries are patent proximally. No evidence of proximal hemodynamically significant stenosis. Limited evaluation of the distal vessels due to venous contamination. Venous sinuses: As permitted by contrast timing, patent. Small left transverse and sigmoid sinuses. Review of the MIP images confirms the above findings. IMPRESSION: No evidence of large vessel occlusion or proximal hemodynamically significant stenosis. Limited evaluation of the distal vasculature due to venous  contamination. Electronically Signed   By: Margaretha Sheffield MD   On: 05/09/2020 14:41   CT ANGIO NECK CODE STROKE  Result Date: 05/09/2020 CLINICAL DATA:  Acute neuro deficit. Slurred speech. Headache 3 weeks. EXAM: CT ANGIOGRAPHY HEAD AND NECK TECHNIQUE: Multidetector CT imaging of the head and neck was performed using the standard protocol during bolus administration of intravenous contrast. Multiplanar CT image reconstructions and MIPs were obtained to evaluate the vascular anatomy. Carotid stenosis measurements (when applicable) are obtained utilizing NASCET criteria, using the distal internal carotid diameter as the denominator. CONTRAST:  43mL OMNIPAQUE IOHEXOL 350 MG/ML SOLN COMPARISON:  CT head 05/09/2020 FINDINGS: CTA NECK FINDINGS Aortic arch: Standard branching. Imaged portion shows no evidence of aneurysm or dissection. No significant stenosis of the major arch vessel origins. Right carotid system: Atherosclerotic calcification right carotid bifurcation. Approximately 45% diameter stenosis proximal right internal carotid artery due to calcific plaque. Left carotid system: Mild atherosclerotic calcification left carotid bifurcation without significant stenosis. Vertebral arteries: Normal vertebral arteries bilaterally. Skeleton: Mild cervical spondylosis.  No acute skeletal abnormality. Other neck: Negative for mass or edema in the neck. Upper chest: Lung apices clear bilaterally. Review of the MIP images confirms the above findings CTA HEAD FINDINGS Anterior circulation: Mild atherosclerotic calcification in the cavernous carotid bilaterally without stenosis. Anterior and middle cerebral arteries widely patent without stenosis or large vessel occlusion. Posterior circulation: Both vertebral arteries patent to the basilar. PICA patent bilaterally. Basilar widely patent. Superior cerebellar and posterior cerebral arteries normal bilaterally. Venous sinuses: Normal venous enhancement. Anatomic  variants: None Review of the MIP images confirms the above findings IMPRESSION: 1. Negative for intracranial large vessel occlusion. 2. Atherosclerotic  disease carotid bifurcation with less than 50% diameter stenosis bilaterally. Both vertebral arteries widely patent. 3. These results were called by telephone at the time of interpretation on 05/09/2020 at 2:35 pm to provider [facility] , who verbally acknowledged these results. Electronically Signed   By: Franchot Gallo M.D.   On: 05/09/2020 14:36     Assessment/Plan: Diagnosis: Subdural hematomas 1. Does the need for close, 24 hr/day medical supervision in concert with the patient's rehab needs make it unreasonable for this patient to be served in a less intensive setting? Yes 2. Co-Morbidities requiring supervision/potential complications:  1. History of left occipital stroke 3. Due to bladder management, bowel management, safety, skin/wound care, disease management, medication administration, pain management and patient education, does the patient require 24 hr/day rehab nursing? Yes 4. Does the patient require coordinated care of a physician, rehab nurse, therapy disciplines of PT, OT, SLP to address physical and functional deficits in the context of the above medical diagnosis(es)? Yes Addressing deficits in the following areas: balance, endurance, locomotion, strength, transferring, bowel/bladder control, bathing, dressing, feeding, grooming, toileting, speech and psychosocial support 5. Can the patient actively participate in an intensive therapy program of at least 3 hrs of therapy per day at least 5 days per week? Yes 6. The potential for patient to make measurable gains while on inpatient rehab is excellent 7. Anticipated functional outcomes upon discharge from inpatient rehab are S with PT, S with OT, S with SLP. 8. Estimated rehab length of stay to reach the above functional goals is: 5 days 9. Anticipated discharge destination:  Home 10. Overall Rehab/Functional Prognosis: excellent  RECOMMENDATIONS: This patient's condition is appropriate for continued rehabilitative care in the following setting: CIR Patient has agreed to participate in recommended program. No, patient is hoping to be d/ced home if possible Note that insurance prior authorization may be required for reimbursement for recommended care.  Comment: Thank you for this consult. Admission coordinator to follow.   I have personally performed a face to face diagnostic evaluation, including, but not limited to relevant history and physical exam findings, of this patient and developed relevant assessment and plan.  Additionally, I have reviewed and concur with the physician assistant's documentation above.  Leeroy Cha, MD [place] Paganini [place], PA-C 05/10/2020

## 2020-05-10 NOTE — Progress Notes (Signed)
EEG complete - results pending 

## 2020-05-10 NOTE — Procedures (Addendum)
Patient Name: Nicholas [name]  MRN: [id number]  Epilepsy Attending: Lora Havens  Referring Physician/Provider: Dr Su Monks Duration: 05/10/2020 0029 to 05/11/2020 0029  Patient history: 60 year old male with left occipital stroke with some hemorrhage and 2020, presented with acute onset confusion and worsening of baseline right visual loss.  EEG to evaluate for seizures.  Level of alertness: Awake, asleep  AEDs during EEG study: Keppra, gabapentin  Technical aspects: This EEG study was done with scalp electrodes positioned according to the 10-20 International system of electrode placement. Electrical activity was acquired at a sampling rate of 500Hz  and reviewed with a high frequency filter of 70Hz  and a low frequency filter of 1Hz . EEG data were recorded continuously and digitally stored.   Description: The posterior dominant rhythm consists of 8 Hz activity of moderate voltage (25-35 uV) seen predominantly in posterior head regions, asymmetric (L<R) and reactive to eye opening and eye closing. Sleep was characterized by vertex waves, sleep spindles (12 to 14 Hz), maximal frontocentral region.  EEG showed continuous 3 to 5 Hz theta-delta slowing in left temporo-parietal region.  Hyperventilation and photic stimulation were not performed.     ABNORMALITY -Continuous slow, left temporo-parietal region  IMPRESSION: This study is suggestive of cortical dysfunction arising from left temporo-parietal region, likely secondary to underlying structural abnormality.  No seizures or epileptiform discharges were seen throughout the recording.  [name] Barbra Sarks

## 2020-05-10 NOTE — Progress Notes (Signed)
Patient rehooked to EEG with same leads. No initial skin breakdown.

## 2020-05-10 NOTE — Progress Notes (Signed)
Inpatient Rehab Admissions Coordinator Note:   Per therapy recommendations, pt was screened for CIR candidacy by Shann Medal, PT, DPT.  At this time we are recommending a CIR consult and I will place an order per our protocol.  Please contact me with questions.   Shann Medal, PT, DPT [phone] 05/10/20 1:20 PM

## 2020-05-10 NOTE — Progress Notes (Signed)
Admitted from stretcher from [facility].  Paged MD to notify that he is here.  Said ok and to page on-call MD if orders needed.  Oriented to room as much as possible.

## 2020-05-10 NOTE — Progress Notes (Signed)
NEUROLOGY CONSULTATION PROGRESS NOTE   Date of service: May 10, 2020 Patient Name: Nicholas [name] MRN:  [id number] DOB:  1960/06/06  Brief HPI  Nicholas [name] is a 60 y.o. male with PMH significant for  has a past medical history of Anal lesion, Bilateral carpal tunnel syndrome (11/24/2018), Colon polyp, Coronary atherosclerosis of native coronary artery, Diabetic peripheral neuropathy ([place]) (11/24/2018), GERD (gastroesophageal reflux disease), Helicobacter pylori (H. pylori) (09/2009), Hiatal hernia, Hyperplastic colon polyp (2008), Mixed hyperlipidemia, Myocardial infarction ([place]) (06/2008), Stroke ([place]), and Type 2 diabetes mellitus ([place]). who presents with aphasia and found to have subdural hematoma, transferred from [facility].  History obtained from wife due to patient being unable to secondary to Global aphasia. She reports that normal he does not have headaches but that for the last 2-3 months he has been having them off and on. She reports that yesterday morning when she left for work he was fine. She called him around 10 AM and he was still in his normal state of health able to hold the conversation without and deficits noted by wife. She reports that he called her around 12:30 that he felt bad and had a headache. He went to his daughters house to eat lunch. Around 1 he reported issues with his vision. Around 1:30 he started having confusion and family decided to bring him to the hospital. By the time he was brought to the hospital (approx. 2 PM) he could not walk into the hospital from the car and could no longer speak nor understand what was being asked of him. Imaging revealed bilateral subdural hematomas and no LVO. He was started on Keppra and transferred. She reports no known head trauma recently in patient.   Interval Hx   He is currently resting in bed with LTM EEG monitoring. He is unable to answer questions except by saying yeah. He cannot follow commands but will mimic some during  exam. Wife reports that he has not worsened since last night but is not better either. Vitals   Vitals:   05/10/20 0200 05/10/20 0400 05/10/20 0600 05/10/20 0803  BP: 112/66 111/70 123/76 121/79  Pulse: (!) 118 (!) 108 (!) 112 (!) 104  Resp: 18  (!) 22 (!) 22  Temp: 98.1 F (36.7 C) 97.9 F (36.6 C) 98.4 F (36.9 C) (!) 100.7 F (38.2 C)  TempSrc: Oral Oral Oral Oral  SpO2: 94% 95% 95% 95%  Weight:      Height:         Body mass index is 23.86 kg/m.  Physical Exam   General: Laying comfortably in bed; in no acute distress.  HENT: Normal oropharynx and mucosa. Normal external appearance of ears and nose.  Neck: Supple, no pain or tenderness  CV: No JVD. No peripheral edema.  Pulmonary: Symmetric Chest rise. Normal respiratory effort.  Abdomen: Soft to touch, non-tender.  Ext: No cyanosis, edema, or deformity  Skin: No rash. Normal palpation of skin.   Musculoskeletal: Normal digits and nails by inspection. No clubbing.   Neurologic Examination  Mental status/Cognition: Drowsy but arousable unable to assess further due to Global Aphasia  Speech/language: Global Aphasia and only to answer Yeah to questions Cranial nerves:   CN II Pupils equal and reactive to light, no VF deficits    CN III,IV,VI Did not follow finger but could be due to none comprehension   CN V Unable to assess due to Global Aphasia    CN VII no asymmetry, no nasolabial fold  flattening    CN VIII Appears normal hearing to speech   CN IX & X Unable to assess due to Global Aphasia    CN XI Unable to assess due to Global Aphasia    CN XII Unable to assess due to Global Aphasia    Motor:  Unable to fully assess, but when prompted by lifting a limb able to hold it up and has spontaneous movement in all 4 limbs Muscle bulk: normal, tone normal, no pronator drift or tremor present  Sensation: Grimaces to noxious stimuli equally in all 4 limbs  Coordination/Complex Motor:  Unable to assess  Labs    Basic Metabolic Panel:  Lab Results  Component Value Date   NA 136 05/09/2020   K 4.0 05/09/2020   CO2 24 05/09/2020   GLUCOSE 194 (H) 05/09/2020   BUN 10 05/09/2020   CREATININE 0.71 05/09/2020   CALCIUM 9.0 05/09/2020   GFRNONAA >60 05/09/2020   GFRAA 115 10/20/2018   HbA1c:  Lab Results  Component Value Date   HGBA1C 8.0 (H) 05/09/2020   LDL:  Lab Results  Component Value Date   LDLCALC 88 09/02/2018   Urine Drug Screen:     Component Value Date/Time   LABOPIA NONE DETECTED 05/09/2020 1501   COCAINSCRNUR NONE DETECTED 05/09/2020 1501   LABBENZ NONE DETECTED 05/09/2020 1501   AMPHETMU NONE DETECTED 05/09/2020 1501   THCU NONE DETECTED 05/09/2020 1501   LABBARB NONE DETECTED 05/09/2020 1501    Alcohol Level     Component Value Date/Time   ETH <10 05/09/2020 1347   No results found for: PHENYTOIN, ZONISAMIDE, LAMOTRIGINE, LEVETIRACETA No results found for: PHENYTOIN, PHENOBARB, VALPROATE, CBMZ  Imaging and Diagnostic studies  Results for orders placed during the hospital encounter of 05/09/20  CT HEAD WO CONTRAST  Narrative CLINICAL DATA:  Worsening headache history of subdural hematoma  EXAM: CT HEAD WITHOUT CONTRAST  TECHNIQUE: Contiguous axial images were obtained from the base of the skull through the vertex without intravenous contrast.  COMPARISON:  CT brain 05/09/2020, MRI 03/03/2018  FINDINGS: Brain: No large vessel territorial infarction or intracranial mass. Intermediate density left greater than right bilateral subdural hematomas. This measures up to 10 mm maximum thickness on the left and 6 mm maximum thickness on the right, grossly stable in size when similar measurement performed on exam earlier today. Subdural collections appear slightly more dense as compared with prior, with similar appearance on the post contrasted exam performed earlier today. No midline shift. Mild mass effect on underlying brain parenchyma. No definite new  hyperdense blood is seen. The ventricles are stable in size.  Vascular: No hyperdense vessels. Scattered carotid vascular calcification.  Skull: Normal. Negative for fracture or focal lesion.  Sinuses/Orbits: No acute finding. Mucosal disease in the left maxillary sinus.  Other: None  IMPRESSION: 1. Intermediate density left greater than right bilateral subdural hematomas, grossly stable in size compared with exam earlier today. Subdural collections appears slightly more dense as compared with prior CT probably related to contrast media administration. No midline shift.   Electronically Signed By: Donavan Foil M.D. On: 05/09/2020 19:04   Impression   Nicholas [name] is a 60 y.o. male with PMH significant for L occipital stroke with some hemorrhage in Feb 2020 with very little preipheral vision deficit on the right, hx of GERD, DM2, MI, HLD, peripheral neuropathy. His neurologic examination is notable for Global Aphasia. Only able to answer yeah to all questions. Cannot follow commands but can mimic  sometimes. Able to move all limbs with intention. He is drowsy but easily arousable. CT showed no shift or herniation. Will obtain MRI to monitor and will continue LTM EEG.  Recommendations  -MRI to monitor subdural hematoma evolution -Continue LTM EEG -Continue Keppra for now as prophylaxis for seizures in the setting of bilateral subdural hematomas ______________________________________________________________________   Fatima Sanger MD Resident

## 2020-05-10 NOTE — Evaluation (Signed)
Speech Language Pathology Evaluation Patient Details Name: Nicholas [name] MRN: [id number] DOB: 1960-03-10 Today's Date: 05/10/2020 Time: 1010-1050 SLP Time Calculation (min) (ACUTE ONLY): 40 min  Problem List:  Patient Active Problem List   Diagnosis Date Noted  . Subdural hematoma ([place]) 05/09/2020  . Polypharmacy 06/17/2019  . Encounter for screening colonoscopy 06/17/2019  . Vitamin D deficiency 05/10/2019  . Diabetic peripheral neuropathy ([place]) 11/24/2018  . Bilateral carpal tunnel syndrome 11/24/2018  . Acute ischemic stroke ([place]) 03/03/2018  . Uncontrolled type 2 diabetes mellitus with hyperglycemia ([place]) 04/21/2017  . Hyperlipemia 04/21/2016  . Malnutrition of moderate degree 02/11/2016  . Acute CVA (cerebrovascular accident) ([place]) 02/11/2016  . TIA (transient ischemic attack) 02/09/2016  . Right carotid bruit 02/09/2016  . Personal history of noncompliance with medical treatment, presenting hazards to health 02/09/2016  . Type 2 diabetes mellitus with vascular disease ([place]) 12/12/2014  . Essential hypertension, benign 12/12/2014  . Hyperlipidemia 12/12/2014  . 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 TOBACCO ABUSE 07/26/2008  . CORONARY ATHEROSCLEROSIS NATIVE CORONARY ARTERY 07/26/2008  . GERD 07/25/2008  . BACK PAIN, CHRONIC 07/25/2008   Past Medical History:  Past Medical History:  Diagnosis Date  . Anal lesion    Anal papilla/tags - external lesion  . Bilateral carpal tunnel syndrome 11/24/2018  . Colon polyp    Hyperplastic rectosigmoid polyp 9/08  . Coronary atherosclerosis of native coronary artery    DES to PLB, BMS to mid and distal RCA, DES to circumflex and distal LAD, 6/10 (5 stents)  . Diabetic peripheral neuropathy ([place]) 11/24/2018  . GERD (gastroesophageal reflux disease)   . Helicobacter pylori (H. pylori) 09/2009   Treated with helidac  . Hiatal hernia   . Hyperplastic colon polyp 2008  . Mixed hyperlipidemia   . Myocardial infarction ([place]) 06/2008   . Stroke [facility])    January 2018  . Type 2 diabetes mellitus ([place])    Past Surgical History:  Past Surgical History:  Procedure Laterality Date  . CHOLECYSTECTOMY    . COLLAPSED LUNG     15 years ago  . COLONOSCOPY  10/12/2006   Dr. Gala Romney- rectosigmoid polyp s/phot snare polypectomy o/w normal rectum and terminal ileum. hyperplastic polyp on bx  . COLONOSCOPY WITH PROPOFOL N/A 09/01/2019   Procedure: COLONOSCOPY WITH PROPOFOL;  Surgeon: Daneil Dolin, MD;  Location: [facility];  Service: Endoscopy;  Laterality: N/A;  8:30am  . EP IMPLANTABLE DEVICE N/A 02/12/2016   Procedure: Loop Recorder Insertion;  Surgeon: Thompson Grayer, MD;  Location: [facility] LAB;  Service: Cardiovascular;  Laterality: N/A;  . ESOPHAGOGASTRODUODENOSCOPY  10/12/2006   Dr. Gala Romney- examination of the tubular esophagus revealed no mucosal abnormalities. the EG junction was easily traversed. small hiatal hernia, the gastric mucosa o/w appeared normal. there was no infiltrating process or frank ulcer seen.  . implantable loop recorder removal  09/29/2019   MDT Reveal LINQ removed in office by Dr Rayann Heman  . TEE WITHOUT CARDIOVERSION N/A 02/12/2016   Procedure: TRANSESOPHAGEAL ECHOCARDIOGRAM (TEE);  Surgeon: Jerline Pain, MD;  Location: [facility] ENDOSCOPY;  Service: Cardiovascular;  Laterality: N/A;  . WIRE IN APEX OF RIGHT LUNG     Since childhood   HPI:  Nicholas [name] is a 60 y.o. male with PMH significant for significant for prior hs of L occipital stroke with some hemorrhage in Feb 2020 with very little preipheral vision deficit on the right, hx of GERD, DM2, MI, HLD, peripheral neuropathy who is transferred  From [facility] for further care after presenting as stroke code with aphasia and found to have left greater than right bilateral subdural hematoma.   Assessment / Plan / Recommendation Clinical Impression  Pt demonstrates a moderate aphasia with receptive and expressive impairment. He is a little drowsy  at time of assessment, but participated in all tasks. He was able to brush his teeth with min verbal cues to sustain attention to task but also demosntrated some independent problem solving. Pt able to say a few social responses spontaneously, but required visual articulation cues, modeling and carrier phrases to count, repeat words and phrases and name objects without neologisms and perseveration. His auditory comprehension improves with visual cues and basic tasks with context, but generally his y/n responses are not reliable and he is not able to follow commands well. Extensive education with wife as she is eager for therapy. Recommend CIR.  Pt passed [facility], will defer swallow eval at this time.     SLP Assessment  SLP Recommendation/Assessment: Patient needs continued Speech Lanaguage Pathology Services SLP Visit Diagnosis: Aphasia (R47.01)    Follow Up Recommendations       Frequency and Duration min 2x/week  2 weeks      SLP Evaluation Cognition  Overall Cognitive Status: Impaired/Different from baseline Arousal/Alertness: Lethargic Orientation Level: Oriented to person;Other (comment) (unable due to aphasia) Attention: Focused;Sustained Focused Attention: Appears intact Sustained Attention: Impaired Sustained Attention Impairment: Verbal basic Memory: Impaired Memory Impairment: Other (comment) (per wife short term memory impaired at baseline) Awareness: Impaired Awareness Impairment: Intellectual impairment       Comprehension  Auditory Comprehension Overall Auditory Comprehension: Impaired Yes/No Questions: Impaired Basic Biographical Questions: 76-100% accurate Basic Immediate Environment Questions: 0-24% accurate Complex Questions: 0-24% accurate Commands: Impaired One Step Basic Commands: 0-24% accurate Visual Recognition/Discrimination Discrimination: Exceptions to [facility] Common Objects: Not tested [employer] Drawings: Able in field of 2    Expression Verbal  Expression Overall Verbal Expression: Impaired Initiation: No impairment Automatic Speech: Name;Social Response Level of Generative/Spontaneous Verbalization: Word Repetition: Impaired Level of Impairment: Word level Naming: Impairment Responsive: Not tested Confrontation: Impaired Common Objects: Not tested [employer] Drawings: Able in field of 2 Convergent: Not tested Verbal Errors: Neologisms;Perseveration;Not aware of errors Interfering Components: Attention Written Expression Dominant Hand: Right   Oral / Motor  Oral Motor/Sensory Function Overall Oral Motor/Sensory Function: Other (comment) (does not follow oral motor commands, appears strong) Motor Speech Overall Motor Speech: Appears within functional limits for tasks assessed   GO                    Jewelianna Pancoast, Katherene Ponto 05/10/2020, 11:24 AM

## 2020-05-10 NOTE — Consult Note (Signed)
NEUROLOGY CONSULTATION NOTE   Date of service: May 10, 2020 Patient Name: Nicholas [name] MRN:  [id number] DOB:  February 21, 1960 Reason for consult: fluctuating aphasia in setting of bilat SDH _ _ _   _ __   _ __ _ _  __ __   _ __   __ _  History of Present Illness   Nicholas [name] is a 60 y.o. male with PMH significant for  has a past medical history of Anal lesion, Bilateral carpal tunnel syndrome (11/24/2018), Colon polyp, Coronary atherosclerosis of native coronary artery, Diabetic peripheral neuropathy ([place]) (11/24/2018), GERD (gastroesophageal reflux disease), Helicobacter pylori (H. pylori) (09/2009), Hiatal hernia, Hyperplastic colon polyp (2008), Mixed hyperlipidemia, Myocardial infarction ([place]) (06/2008), Stroke ([place]), and Type 2 diabetes mellitus ([place]). who is transferred  From [facility] for further care after presenting as stroke code with aphasia and found to have subdural hematoma.  Pt unable to provide hx 2/2 aphasia. Per Dr. Bartholome Bill telestroke consult note from today, "Nicholas [name] is a 60 y.o. male with PMH significant for prior hs of L occipital stroke with some hemorrhage in Feb 2020 with very little preipheral vision deficit on the right, hx of GERD, DM2, MI, HLD, peripheral neuropathy who presents with acute onset confusion.  Wife and daughter at the bedside provide most of the hx. Patient is unable to provide any hx. He has been reporting headache for the last couple of weeks, worse with bending over then getting up. Last seen at his baseline on 0820 when wife left for work. He called his son in law at 33 about having trouble with vision on the Right that is worse than baseline. He was seen by daughter at 26 and was able to talk to her. They left for hospital at 1310. Enroute, he was having trouble identifying family, stumbling, did not know what to dowith the mask when he got to the hospital   In the ED, stroke code was called for concern for aphasia. On my  evaluation, he is tracking RNs in the room briefly. Unable to follow commands when asked but does attempt to mimic when shown what to do. Able to keep all extremities up in the air when held up antigravity.  Workup with CTH w/o contrast with BL subdural hematomas. Family reports that he has been having headches for the last 2-3 weeks."  CTA did not show LVO.He received LEV load and was placed on 523m bid maint. He was able to follow some commands while in the CT scanner but this evening was then unable to again. CT head after exam change did not show any acute changes.  ROS   UTA 2/2 aphasia  Past History   Past Medical History:  Diagnosis Date  . Anal lesion    Anal papilla/tags - external lesion  . Bilateral carpal tunnel syndrome 11/24/2018  . Colon polyp    Hyperplastic rectosigmoid polyp 9/08  . Coronary atherosclerosis of native coronary artery    DES to PLB, BMS to mid and distal RCA, DES to circumflex and distal LAD, 6/10 (5 stents)  . Diabetic peripheral neuropathy ([place] 11/24/2018  . GERD (gastroesophageal reflux disease)   . Helicobacter pylori (H. pylori) 09/2009   Treated with helidac  . Hiatal hernia   . Hyperplastic colon polyp 2008  . Mixed hyperlipidemia   . Myocardial infarction ([place] 06/2008  . Stroke ([facility]    January 2018  . Type 2 diabetes mellitus ([facility]    Past Surgical  History:  Procedure Laterality Date  . CHOLECYSTECTOMY    . COLLAPSED LUNG     15 years ago  . COLONOSCOPY  10/12/2006   Dr. Gala Romney- rectosigmoid polyp s/phot snare polypectomy o/w normal rectum and terminal ileum. hyperplastic polyp on bx  . COLONOSCOPY WITH PROPOFOL N/A 09/01/2019   Procedure: COLONOSCOPY WITH PROPOFOL;  Surgeon: Daneil Dolin, MD;  Location[facility];  Service: Endoscopy;  Laterality: N/A;  8:30am  . EP IMPLANTABLE DEVICE N/A 02/12/2016   Procedure: Loop Recorder Insertion;  Surgeon: Thompson Grayer, MD;  Location: [facility] LAB;  Service: Cardiovascular;   Laterality: N/A;  . ESOPHAGOGASTRODUODENOSCOPY  10/12/2006   Dr. Gala Romney- examination of the tubular esophagus revealed no mucosal abnormalities. the EG junction was easily traversed. small hiatal hernia, the gastric mucosa o/w appeared normal. there was no infiltrating process or frank ulcer seen.  . implantable loop recorder removal  09/29/2019   MDT Reveal LINQ removed in office by Dr Rayann Heman  . TEE WITHOUT CARDIOVERSION N/A 02/12/2016   Procedure: TRANSESOPHAGEAL ECHOCARDIOGRAM (TEE);  Surgeon: Jerline Pain, MD;  Location: [facility] ENDOSCOPY;  Service: Cardiovascular;  Laterality: N/A;  . WIRE IN APEX OF RIGHT LUNG     Since childhood   Family History  Problem Relation Age of Onset  . Diabetes Mother   . Heart disease Father   . Cancer Brother   . CVA Neg Hx    Social History   Socioeconomic History  . Marital status: Married    Spouse name: Not on file  . Number of children: Not on file  . Years of education: Not on file  . Highest education level: Not on file  Occupational History  . Occupation: maintenance, parking lots    Employer: SELF-EMPLOYED  Tobacco Use  . Smoking status: Current Every Day Smoker    Packs/day: 0.50    Years: 34.00    Pack years: 17.00    Types: Cigarettes    Start date: 04/08/1973  . Smokeless tobacco: Never Used  Vaping Use  . Vaping Use: Never used  Substance and Sexual Activity  . Alcohol use: No    Alcohol/week: 0.0 standard drinks  . Drug use: No  . Sexual activity: Yes    Partners: Female    Birth control/protection: Pill  Other Topics Concern  . Not on file  Social History Narrative   Drinks Coffee 2 cups daily.  Lives sat home with wife.   Is self employed.  Graduated from High school.     Social Determinants of Health   Financial Resource Strain: Not on file  Food Insecurity: Not on file  Transportation Needs: Not on file  Physical Activity: Not on file  Stress: Not on file  Social Connections: Not on file   No Known  Allergies  Medications   Medications Prior to Admission  Medication Sig Dispense Refill Last Dose  . albuterol (VENTOLIN HFA) 108 (90 Base) MCG/ACT inhaler Inhale 1-2 puffs into the lungs every 6 (six) hours as needed for wheezing or shortness of breath.     Marland Kitchen aspirin EC 325 MG tablet Take 325 mg by mouth daily.     . clopidogrel (PLAVIX) 75 MG tablet Take 1 tablet by mouth once daily 90 tablet 2   . gabapentin (NEURONTIN) 100 MG capsule Take 100 mg by mouth daily.      . nitroGLYCERIN (NITROSTAT) 0.4 MG SL tablet DISSOLVE 1 TABLET UNDER TONGUE EVERY 5 MINUTES UP TO 15 MIN FOR CHESTPAIN.  IF NO RELIEF CALL 911. 25 tablet 3   . pantoprazole (PROTONIX) 40 MG tablet Take 40 mg by mouth daily.     . traMADol (ULTRAM) 50 MG tablet Take 1 tablet (50 mg total) by mouth every 6 (six) hours as needed for moderate pain. 15 tablet 0   . BD PEN NEEDLE NANO U/F 32G X 4 MM MISC USE AS DIRECTED FOUR TIMES DAILY. 100 each 2   . Continuous Blood Gluc Receiver (FREESTYLE LIBRE 2 READER) DEVI As directed 1 each 0   . Continuous Blood Gluc Sensor (FREESTYLE LIBRE 2 SENSOR) MISC 1 Piece by Does not apply route every 14 (fourteen) days. 2 each 3   . LANTUS SOLOSTAR 100 UNIT/ML Solostar Pen INJECT 40 UNITS SUBCUTANEOUSLY ONCE DAILY (Patient taking differently: Inject into the skin at bedtime.) 15 mL 0   . SUPREP BOWEL PREP KIT 17.5-3.13-1.6 GM/177ML SOLN Take 354 mLs by mouth once. (Patient not taking: Reported on 05/09/2020)   Not Taking at Unknown time     Vitals   Vitals:   05/09/20 2330 05/10/20 0000 05/10/20 0200 05/10/20 0400  BP: 102/62 104/70 112/66 111/70  Pulse: (!) 119 (!) 118 (!) 118 (!) 108  Resp: (!) _0 Temp: 98 F (36.7 C) 98 F (36.7 C) 98.1 F (36.7 C) 97.9 F (36.6 C)  TempSrc: Oral Oral Oral Oral  SpO2: 96% 95% 94% 95%  Weight: 73.3 kg     Height: 5' 9" (1.753 m)        Body mass index is 23.86 kg/m.  Physical Exam   Physical Exam Gen: A&O x4, NAD HEENT: Atraumatic,  normocephalic;mucous membranes moist; oropharynx clear, tongue without atrophy or fasciculations. Neck: Supple, trachea midline. Resp: CTAB, no w/r/r CV: RRR, no m/g/r; nml S1 and S2. 2+ symmetric peripheral pulses. Abd: soft/NT/ND; nabs x 4 quad Extrem: Nml bulk; no cyanosis, clubbing, or edema.  Neuro: *SK:AJGOT, does not answer orientation questions *Speech: no intelligible speech *CN:    I: Deferred   II,III: PERRLA, blinks to threat bilat   III,IV,VI: tracks examiner well   V: Sensation intact from V1 to V3 to LT   VII: Eyelid closure was full.  Smile symmetric.   VIII: Hearing intact to voice  *Motor:   Normal bulk.  No tremor, rigidity or bradykinesia. Formal strength exam limited 2/2 inability to follow commands but moves all extremities spontaneously and equally with apparent full strength *Sensory: SILT *Coordination:  UTA *Reflexes:  2+ and symmetric throughout without clonus; toes down-going bilat *Gait: UTA  General:           NIHSS components Score: Comment  1a Level of Conscious 0[x]? 1[]? 2[]? 3[]?     1b LOC Questions 0[]? 1[]? 2[x]?      1c LOC Commands 0[]? 1[]? 2[x]?      2 Best Gaze 0[x]? 1[]? 2[]?      3 Visual 0[]? 1[x]? 2[]? 3[]?     4 Facial Palsy 0[x]? 1[]? 2[]? 3[]?     5a Motor Arm - left 0[x]? 1[]? 2[]? 3[]? 4[]? UN[]?   5b Motor Arm - Right 0[x]? 1[]? 2[]? 3[]? 4[]? UN[]?   6a Motor Leg - Left 0[x]? 1[]? 2[]? 3[]? 4[]? UN[]?   6b Motor Leg - Right 0[x]? 1[]? 2[]? 3[]? 4[]? UN[]?   7 Limb Ataxia 0[x]? 1[]? 2[]? 3[]? UN[]?    8 Sensory 0[x]? 1[]? 2[]? UN[]?     9 Best Language 0[]? 1[]? 2[x]?  3[]?     10 Dysarthria 0[x]? 1[]? 2[]? UN[]?     11 Extinct. and Inattention 0[x]? 1[]? 2[]?      TOTAL: 7       Labs   CBC:  Recent Labs  Lab 05/09/20 1347  WBC 10.5  NEUTROABS 7.6  HGB 14.9  HCT 45.3  MCV 83.6  PLT 250    Basic Metabolic Panel:  Lab Results  Component Value Date   NA 136  05/09/2020   K 4.0 05/09/2020   CO2 24 05/09/2020   GLUCOSE 194 (H) 05/09/2020   BUN 10 05/09/2020   CREATININE 0.71 05/09/2020   CALCIUM 9.0 05/09/2020   GFRNONAA >60 05/09/2020   GFRAA 115 10/20/2018   Lipid Panel:  Lab Results  Component Value Date   LDLCALC 88 09/02/2018   HgbA1c:  Lab Results  Component Value Date   HGBA1C 8.0 (H) 05/09/2020   Urine Drug Screen:     Component Value Date/Time   LABOPIA NONE DETECTED 05/09/2020 1501   COCAINSCRNUR NONE DETECTED 05/09/2020 1501   LABBENZ NONE DETECTED 05/09/2020 1501   AMPHETMU NONE DETECTED 05/09/2020 1501   THCU NONE DETECTED 05/09/2020 1501   LABBARB NONE DETECTED 05/09/2020 1501    Alcohol Level     Component Value Date/Time   ETH <10 05/09/2020 1347    CT HEAD CODE STROKE WO CONTRAST  Narrative CLINICAL DATA:  Code stroke. Acute neuro deficit. Confusion slurred speech.  EXAM: CT HEAD WITHOUT CONTRAST 1. Bilateral intermediate density subdural collections left greater than right. 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 No high-density hemorrhage. Mild mass-effect on the left cerebral hemisphere without midline shift. 2. ASPECTS is 10 3. These results were called by telephone at the time of interpretation on 05/09/2020 at 2:03 pm to provider [facility] , who verbally acknowledged these results.  CT Angio head and Neck: No LVO    Impression   Nicholas [name] is a 60 y.o. male with PMH significant for L occipital stroke with some hemorrhage in Feb 2020 with very little preipheral vision deficit on the right, hx of GERD, DM2, MI, HLD, peripheral neuropathy who presents with acute onset confusion and worsening of baseline R vision loss. LKW 0820 on 05/09/20. His limited tele-neurologic examination is notable for aphasia with word salad, R vision deficit. CTH with BL intermediate density SDH. CTA with no LVO.  SDH likely explains the presentation. Vitals, glucose were normal with normal O2 sats and unlikely to  explain his presentation. No movement observed concerning for a seizure but he could have a focal seizure presenting with epileptic aphasia.  2/2 apparent fluctuation in ability to follow commands cEEG was ordered on arrival to [facility]. First 2 hours of recording showed bifrontal slowing with one run of faster activity over the right frontal region that was sharply contoured but not clearly epileptic.  Recommendations   - Keppra 1534m IV loaded in [facility] ED - Keppra 505mBID x 1 week - SBP < 140, INR < 1.4, Platelet > 100. - No acute intervention indicated per discussion w/ NSU during telestroke ______________________________________________________________________   Thank you for the opportunity to take part in the care of this patient. If you have any further questions, please contact the neurology consultation attending.  Signed,  CoSu MonksMD [facility] [phone] 7pm- 7am, please page neurology on call as listed in [facility]

## 2020-05-10 NOTE — Evaluation (Signed)
Physical Therapy Evaluation Patient Details Name: Nicholas [name] MRN: [id number] DOB: 06-Jul-1960 Today's Date: 05/10/2020   History of Present Illness  Nicholas [name] is a 60 y.o. male with PMH significant for significant for prior hs of L occipital stroke with some hemorrhage in Feb 2020 with very little preipheral vision deficit on the right, hx of GERD, DM2, MI, HLD, peripheral neuropathy who is transferred  From [facility] for further care after presenting as stroke code with aphasia and found to have left greater than right bilateral subdural hematoma.  Clinical Impression  Patient presents with decreased mobility due to decreased attention, awareness and safety, decreased balance, and decreased activity tolerance.  Patient able to follow commands about 75% of session though slow and with multimodal cues.  He responded appropriately verbally only a couple times.  Family at the bedside and awaiting test results.  Feel he may be appropriate for CIR level rehab prior to d/c home.  Wife works and has some limited leave time, they have other family in the area.  PT to continue to follow acutely.     Follow Up Recommendations CIR;Supervision/Assistance - 24 hour    Equipment Recommendations  Other (comment) (TBA)    Recommendations for Other Services Rehab consult     Precautions / Restrictions Precautions Precautions: Fall      Mobility  Bed Mobility Overal bed mobility: Needs Assistance Bed Mobility: Supine to Sit     Supine to sit: Min guard;HOB elevated     General bed mobility comments: assist for balance initially, increased time to initiate    Transfers Overall transfer level: Needs assistance Equipment used: None Transfers: Sit to/from Stand Sit to Stand: Min guard;Min assist         General transfer comment: assist for balance, safety, initially stood, then sat right back down due to decreased balance/anterior weight  shift  Ambulation/Gait Ambulation/Gait assistance: Min assist;Mod assist Gait Distance (Feet): 22 Feet Assistive device: None Gait Pattern/deviations: Step-through pattern;Decreased stride length;Staggering right;Staggering left;Narrow base of support     General Gait Details: Unsteady and lumbering, with min to mod A for balance/safety.  Did not want to walk further/outside room, awaiting MRI and still NPO per wife in case needs surgery.  Stairs            Wheelchair Mobility    Modified Rankin (Stroke Patients Only) Modified Rankin (Stroke Patients Only) Pre-Morbid Rankin Score: No symptoms Modified Rankin: Moderately severe disability     Balance Overall balance assessment: Needs assistance Sitting-balance support: Feet supported Sitting balance-Leahy Scale: Fair     Standing balance support: No upper extremity supported Standing balance-Leahy Scale: Poor                               Pertinent Vitals/Pain Pain Assessment: No/denies pain    Home Living Family/patient expects to be discharged to:: Private residence Living Arrangements: Spouse/significant other Available Help at Discharge: Family Type of Home: House Home Access: Stairs to enter Entrance Stairs-Rails: Left Entrance Stairs-Number of Steps: 8 Home Layout: One level Home Equipment: None      Prior Function Level of Independence: Independent               Hand Dominance   Dominant Hand: Right    Extremity/Trunk Assessment   Upper Extremity Assessment Upper Extremity Assessment: RUE deficits/detail RUE Deficits / Details: strength WFL, shoulder elevation limited compared to L  Lower Extremity Assessment Lower Extremity Assessment: Overall WFL for tasks assessed       Communication   Communication: Receptive difficulties;Expressive difficulties  Cognition Arousal/Alertness: Lethargic Behavior During Therapy: Flat affect Overall Cognitive Status:  Impaired/Different from baseline Area of Impairment: Orientation;Following commands;Safety/judgement;Problem solving                 Orientation Level: Disoriented to;Place;Time;Situation     Following Commands: Follows one step commands inconsistently;Follows one step commands with increased time;Follows multi-step commands with increased time Safety/Judgement: Decreased awareness of safety;Decreased awareness of deficits   Problem Solving: Slow processing        General Comments General comments (skin integrity, edema, etc.): wife and son in the room and pt able to state his name and son't name, but not wife's name, inconsistent with yes/no, and jargon speech when asked what month, etc.    Exercises     Assessment/Plan    PT Assessment Patient needs continued PT services  PT Problem List Decreased mobility;Decreased knowledge of precautions;Decreased cognition;Decreased balance;Decreased activity tolerance       PT Treatment Interventions DME instruction;Therapeutic activities;Gait training;Therapeutic exercise;Patient/family education;Cognitive remediation;Balance training;Functional mobility training;Stair training    PT Goals (Current goals can be found in the Care Plan section)  Acute Rehab PT Goals Patient Stated Goal: to return to independent PT Goal Formulation: With family Time For Goal Achievement: 05/24/20 Potential to Achieve Goals: Good    Frequency Min 4X/week   Barriers to discharge        Co-evaluation               AM-PAC PT "6 Clicks" Mobility  Outcome Measure Help needed turning from your back to your side while in a flat bed without using bedrails?: None Help needed moving from lying on your back to sitting on the side of a flat bed without using bedrails?: A Little Help needed moving to and from a bed to a chair (including a wheelchair)?: A Little Help needed standing up from a chair using your arms (e.g., wheelchair or bedside  chair)?: A Little Help needed to walk in hospital room?: A Lot Help needed climbing 3-5 steps with a railing? : A Lot 6 Click Score: 17    End of Session   Activity Tolerance: Other (comment) (self limited, awaiting MRI) Patient left: in bed;with family/visitor present Nurse Communication: Mobility status;Other (comment) (seated EOB with wife beside) PT Visit Diagnosis: Other abnormalities of gait and mobility (R26.89);Other symptoms and signs involving the nervous system (R29.898)    Time: 2671-2458 PT Time Calculation (min) (ACUTE ONLY): 25 min   Charges:   PT Evaluation $PT Eval Moderate Complexity: 1 Mod PT Treatments $Gait Training: 8-22 mins        Nicholas [name], PT Acute Rehabilitation Services Pager:[phone] [phone] 05/10/2020   Nicholas [name] 05/10/2020, 1:02 PM

## 2020-05-11 DIAGNOSIS — S065X9A Traumatic subdural hemorrhage with loss of consciousness of unspecified duration, initial encounter: Secondary | ICD-10-CM

## 2020-05-11 LAB — CBC
HCT: 42.7 % (ref 39.0–52.0)
Hemoglobin: 14.1 g/dL (ref 13.0–17.0)
MCH: 27.6 pg (ref 26.0–34.0)
MCHC: 33 g/dL (ref 30.0–36.0)
MCV: 83.7 fL (ref 80.0–100.0)
Platelets: 166 10*3/uL (ref 150–400)
RBC: 5.1 MIL/uL (ref 4.22–5.81)
RDW: 14.2 % (ref 11.5–15.5)
WBC: 11.7 10*3/uL — ABNORMAL HIGH (ref 4.0–10.5)
nRBC: 0 % (ref 0.0–0.2)

## 2020-05-11 LAB — BASIC METABOLIC PANEL
Anion gap: 3 — ABNORMAL LOW (ref 5–15)
BUN: 12 mg/dL (ref 8–23)
CO2: 27 mmol/L (ref 22–32)
Calcium: 8.8 mg/dL — ABNORMAL LOW (ref 8.9–10.3)
Chloride: 108 mmol/L (ref 98–111)
Creatinine, Ser: 0.83 mg/dL (ref 0.61–1.24)
GFR, Estimated: >60 mL/min (ref >60)
Glucose, Bld: 113 mg/dL — ABNORMAL HIGH (ref 70–99)
Potassium: 3.7 mmol/L (ref 3.5–5.1)
Sodium: 138 mmol/L (ref 135–145)

## 2020-05-11 LAB — LIPID PANEL
Cholesterol: 138 mg/dL (ref 0–200)
HDL: 28 mg/dL — ABNORMAL LOW (ref >40)
LDL Cholesterol: 90 mg/dL (ref 0–99)
Total CHOL/HDL Ratio: 4.9 RATIO
Triglycerides: 98 mg/dL (ref <150)
VLDL: 20 mg/dL (ref 0–40)

## 2020-05-11 LAB — GLUCOSE, CAPILLARY
Glucose-Capillary: 103 mg/dL — ABNORMAL HIGH (ref 70–99)
Glucose-Capillary: 118 mg/dL — ABNORMAL HIGH (ref 70–99)
Glucose-Capillary: 151 mg/dL — ABNORMAL HIGH (ref 70–99)
Glucose-Capillary: 157 mg/dL — ABNORMAL HIGH (ref 70–99)
Glucose-Capillary: 169 mg/dL — ABNORMAL HIGH (ref 70–99)
Glucose-Capillary: 219 mg/dL — ABNORMAL HIGH (ref 70–99)

## 2020-05-11 NOTE — Progress Notes (Signed)
59060NEUROLOGY CONSULTATION PROGRESS NOTE   Date of service: May 11, 2020 Patient Name: Nicholas [name] MRN:  [id number] DOB:  19-Apr-1960  Brief HPI  SOFIA JAQUITH is a 60 y.o. male with PMH significant for  has a past medical history of Anal lesion, Bilateral carpal tunnel syndrome (11/24/2018), Colon polyp, Coronary atherosclerosis of native coronary artery, Diabetic peripheral neuropathy ([place]) (11/24/2018), GERD (gastroesophageal reflux disease), Helicobacter pylori (H. pylori) (09/2009), Hiatal hernia, Hyperplastic colon polyp (2008), Mixed hyperlipidemia, Myocardial infarction ([place]) (06/2008), Stroke ([place]), and Type 2 diabetes mellitus ([place]). who presents with aphasia and found to have subdural hematoma, transferred from [facility].  History obtained from wife due to patient being unable to secondary to Global aphasia. She reports that normal he does not have headaches but that for the last 2-3 months he has been having them off and on. She reports that yesterday morning when she left for work he was fine. She called him around 10 AM and he was still in his normal state of health able to hold the conversation without and deficits noted by wife. She reports that he called her around 12:30 that he felt bad and had a headache. He went to his daughters house to eat lunch. Around 1 he reported issues with his vision. Around 1:30 he started having confusion and family decided to bring him to the hospital. By the time he was brought to the hospital (approx. 2 PM) he could not walk into the hospital from the car and could no longer speak nor understand what was being asked of him. Imaging revealed bilateral subdural hematomas and no LVO. He was started on Keppra and transferred. She reports no known head trauma recently in patient.   Interval Hx   Today he is able to hold a conversation and move. He reports no issues today except wanting to be discharged. He has been able to eat and drink with no episodes  of choking or coughing. He has no other concerns at present. Vitals   Vitals:   05/10/20 1646 05/10/20 2028 05/10/20 2338 05/11/20 0333  BP: 99/70 106/67 101/64 99/64  Pulse: 92 75 74 83  Resp: 18 18 17 17   Temp: 99.4 F (37.4 C)  98 F (36.7 C) 98.2 F (36.8 C)  TempSrc: Oral   Oral  SpO2: 95% 97% 96% 95%  Weight:      Height:         Body mass index is 23.86 kg/m.  Physical Exam   General: Laying comfortably in bed; in no acute distress.  HENT: Normal oropharynx and mucosa. Normal external appearance of ears and nose.  Neck: Supple, no pain or tenderness  CV: No JVD. No peripheral edema.  Pulmonary: Symmetric Chest rise. Normal respiratory effort.  Abdomen: Soft to touch, non-tender.  Ext: No cyanosis, edema, or deformity  Skin: No rash. Normal palpation of skin.   Musculoskeletal: Normal digits and nails by inspection. No clubbing.   Neurologic Examination  Mental status/Cognition: Alert, oriented to self, place, month and year, good attention. Able to follow all commands. Speech/language: Fluent, comprehension intact, object naming intact, repetition intact.  Cranial nerves:   CN II Pupils equal and reactive to light, no VF deficits    CN III,IV,VI EOM intact, no gaze preference or deviation, no nystagmus    CN V normal sensation in V1, V2, and V3 segments bilaterally    CN VII no asymmetry, no nasolabial fold flattening    CN VIII normal hearing to  speech    CN IX & X normal palatal elevation, no uvular deviation    CN XI 5/5 head turn and 5/5 shoulder shrug bilaterally    CN XII midline tongue protrusion    Motor:  RUE: 5-/5   LUE: 5/5 RLE: 5/5   LLE: 5/5 Muscle bulk: normal, tone normal, no pronator drift or tremor present Grip strength is slightly lessened in the right hand  Sensation:  Light touch    Pin prick    Temperature    Vibration   Proprioception    Coordination/Complex Motor:  - Finger to Nose intact bilaterally - Heel to shin intact  bilaterally - Rapid alternating movement intact bilaterally - Gait: deferred  Labs   Basic Metabolic Panel:  Lab Results  Component Value Date   NA 138 05/11/2020   K 3.7 05/11/2020   CO2 27 05/11/2020   GLUCOSE 113 (H) 05/11/2020   BUN 12 05/11/2020   CREATININE 0.83 05/11/2020   CALCIUM 8.8 (L) 05/11/2020   GFRNONAA >60 05/11/2020   GFRAA 115 10/20/2018   HbA1c:  Lab Results  Component Value Date   HGBA1C 8.0 (H) 05/09/2020   LDL:  Lab Results  Component Value Date   LDLCALC 90 05/11/2020   Urine Drug Screen:     Component Value Date/Time   LABOPIA NONE DETECTED 05/09/2020 1501   COCAINSCRNUR NONE DETECTED 05/09/2020 1501   LABBENZ NONE DETECTED 05/09/2020 1501   AMPHETMU NONE DETECTED 05/09/2020 1501   THCU NONE DETECTED 05/09/2020 1501   LABBARB NONE DETECTED 05/09/2020 1501    Alcohol Level     Component Value Date/Time   ETH <10 05/09/2020 1347   No results found for: PHENYTOIN, ZONISAMIDE, LAMOTRIGINE, LEVETIRACETA No results found for: PHENYTOIN, PHENOBARB, VALPROATE, CBMZ  Imaging and Diagnostic studies  Results for orders placed during the hospital encounter of 05/09/20  MR BRAIN WO CONTRAST  Narrative CLINICAL DATA:  Stroke follow-up  EXAM: MRI HEAD WITHOUT CONTRAST  TECHNIQUE: Multiplanar, multiecho pulse sequences of the brain and surrounding structures were obtained without intravenous contrast.  COMPARISON:  CT head 05/09/2020.  MRI head 03/03/2018  FINDINGS: Brain: Negative for acute infarct. Few small white matter hyperintensities bilaterally. Negative for mass lesion  Small bilateral extra-axial fluid collections. These are intermediate density on CT and are hyperdense on FLAIR. This measures approximately 3 mm on the right and 6 mm on the left. Findings compatible with small chronic subdural hemorrhage.  Vascular: Normal arterial flow voids  Skull and upper cervical spine: No focal skeletal lesion.  Sinuses/Orbits:  Mucosal edema left maxillary sinus.  Normal orbit  Other: None  IMPRESSION: Negative for acute infarct  Small bilateral extra-axial fluid collection compatible with chronic subdural hematoma.   Electronically Signed By: Franchot Gallo M.D. On: 05/10/2020 13:08   Impression   DEVERICK PRUSS is a 60 y.o. male with PMH significant for L occipital stroke with some hemorrhage in Feb 2020 with very little preipheral vision deficit on the right, hx of GERD, DM2, MI, HLD, peripheral neuropathy. His neurologic examination is notable for significant improvement since yesterday. He is able to hold conversations and follow commands. His MRI was negative for acute infarct and that his subdural hematomas are chronic in appearance. Will continue with LTM EEG and Keppra.   Recommendations  -Continue Keppra for seizure prophylaxis -Continue working with PT/OT for disposition ______________________________________________________________________   Fatima Sanger MD Resident

## 2020-05-11 NOTE — Progress Notes (Signed)
IP rehab admissions - Went to patient's room.  Therapist said patient did really well and now will just need outpatient therapy follow up.  Wife in room and patient wants to go home.  I have made case manager aware of the above change.  I will not pursue CIR admission since recommendations have changed.  Call for questions.  [phone]

## 2020-05-11 NOTE — Procedures (Signed)
Patient Name: KYEN TAITE  MRN: [id number]  Epilepsy Attending: Lora Havens  Referring Physician/Provider: Dr Su Monks Duration: 05/11/2020 0029 to 05/11/2020 1204  Patient history: 60 year old male with left occipital stroke with some hemorrhage and 2020, presented with acute onset confusion and worsening of baseline right visual loss.  EEG to evaluate for seizures.  Level of alertness: Awake, asleep  AEDs during EEG study: Keppra, gabapentin  Technical aspects: This EEG study was done with scalp electrodes positioned according to the 10-20 International system of electrode placement. Electrical activity was acquired at a sampling rate of 500Hz  and reviewed with a high frequency filter of 70Hz  and a low frequency filter of 1Hz . EEG data were recorded continuously and digitally stored.   Description: The posterior dominant rhythm consists of 8 Hz activity of moderate voltage (25-35 uV) seen predominantly in posterior head regions, asymmetric (L<R) and reactive to eye opening and eye closing. Sleep was characterized by vertex waves, sleep spindles (12 to 14 Hz), maximal frontocentral region.  EEG showed intermittent 3 to 5 Hz theta-delta slowing in left temporo-parietal region. Hyperventilation and photic stimulation were not performed.     ABNORMALITY - Intermittent slow, left temporo-parietal region  IMPRESSION: This study is suggestive of cortical dysfunction arising from left temporo-parietal region, likely secondary to underlying structural abnormality.  No seizures or epileptiform discharges were seen throughout the recording.  Yadir Zentner Barbra Sarks

## 2020-05-11 NOTE — Progress Notes (Signed)
LTM EEG discontinued - no skin breakdown at unhook.   
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
## 2020-05-11 NOTE — TOC Transition Note (Signed)
Transition of Care [facility]) - CM/SW Discharge Note   Patient Details  Name: MANJINDER BREAU MRN: [id number] Date of Birth: [birth year]  Transition of Care [facility] CM/SW Contact:  Pollie Friar, RN Phone Number: 05/11/2020, 2:55 PM   Clinical Narrative:    Recommendations have changed to outpatient therapy. CM met with the patient and his spouse and they prefer to attend therapy at [facility]. Orders in Epic and information on the AVS. Wife is able to provide supervision at home.  No issues with home meds or transportation.  Wife will transport home when medically ready.   Final next level of care: OP Rehab Barriers to Discharge: No Barriers Identified   Patient Goals and CMS Choice     Choice offered to / list presented to : [facility]  Discharge Placement                       Discharge Plan and Services                                     Social Determinants of Health (SDOH) Interventions     Readmission Risk Interventions No flowsheet data found.

## 2020-05-11 NOTE — Progress Notes (Signed)
Physical Therapy Treatment Patient Details Name: Nicholas [name] MRN: [id number] DOB: 1960/06/04 Today's Date: 05/11/2020    History of Present Illness Nicholas [name] is a 60 y.o. male with PMH significant for significant for prior hs of L occipital stroke with some hemorrhage in Feb 2020 with very little preipheral vision deficit on the right, hx of GERD, DM2, MI, HLD, peripheral neuropathy who is transferred  From [facility] for further care after presenting as stroke code with aphasia and found to have left greater than right bilateral subdural hematoma.    PT Comments    Patient progressing well towards physical therapy goals. Patient modI for bed mobiltiy and supervision for transfers with no AD. Patient ambulated 200' with no AD and min guard initially progressing to supervision. Patient negotiated 2 stairs with no rails and min guard for safety. Educated patient and wife on supervision initially when returning home due to balance deficits and need for safety, patient and wife verbalized understanding. Updated discharge recommendation to OPPT.     Follow Up Recommendations  Outpatient PT;Supervision for mobility/OOB     Equipment Recommendations  None recommended by PT    Recommendations for Other Services       Precautions / Restrictions Precautions Precautions: Fall Restrictions Weight Bearing Restrictions: No    Mobility  Bed Mobility Overal bed mobility: Modified Independent                  Transfers Overall transfer level: Needs assistance Equipment used: None Transfers: Sit to/from Stand Sit to Stand: Supervision         General transfer comment: supervision for safety  Ambulation/Gait Ambulation/Gait assistance: Min guard;Supervision Gait Distance (Feet): 200 Feet Assistive device: None Gait Pattern/deviations: Drifts right/left;Step-through pattern   Gait velocity interpretation: >2.62 ft/sec, indicative of community ambulatory General  Gait Details: Drifts L/R throughout but able to maintain balance throughout. Min guard initially but progressed to supervision.   Stairs Stairs: Yes Stairs assistance: Min guard Stair Management: No rails;Step to pattern;Forwards Number of Stairs: 2 General stair comments: min guard for safety with step to pattern   Wheelchair Mobility    Modified Rankin (Stroke Patients Only)       Balance Overall balance assessment: Mild deficits observed, not formally tested                                          Cognition Arousal/Alertness: Awake/alert Behavior During Therapy: WFL for tasks assessed/performed Overall Cognitive Status: Within Functional Limits for tasks assessed                                        Exercises      General Comments        Pertinent Vitals/Pain Pain Assessment: No/denies pain    Home Living                      Prior Function            PT Goals (current goals can now be found in the care plan section) Acute Rehab PT Goals Patient Stated Goal: to go home PT Goal Formulation: With patient Time For Goal Achievement: 05/24/20 Potential to Achieve Goals: Good Progress towards PT goals: Progressing toward goals    Frequency  Min 4X/week      PT Plan Discharge plan needs to be updated    Co-evaluation              AM-PAC PT "6 Clicks" Mobility   Outcome Measure  Help needed turning from your back to your side while in a flat bed without using bedrails?: None Help needed moving from lying on your back to sitting on the side of a flat bed without using bedrails?: None Help needed moving to and from a bed to a chair (including a wheelchair)?: None Help needed standing up from a chair using your arms (e.g., wheelchair or bedside chair)?: A Little Help needed to walk in hospital room?: A Little Help needed climbing 3-5 steps with a railing? : A Little 6 Click Score: 21    End of  Session Equipment Utilized During Treatment: Gait belt Activity Tolerance: Patient tolerated treatment well Patient left: in bed;with family/visitor present Nurse Communication: Mobility status PT Visit Diagnosis: Other abnormalities of gait and mobility (R26.89);Other symptoms and signs involving the nervous system (S97.026)     Time: 3785-8850 PT Time Calculation (min) (ACUTE ONLY): 26 min  Charges:  $Therapeutic Activity: 23-37 mins                     [place] Severs A. Gilford Rile PT, DPT Acute Rehabilitation Services Pager [phone] Office [phone]    Linna Hoff 05/11/2020, 3:01 PM

## 2020-05-11 NOTE — Plan of Care (Signed)

## 2020-05-12 LAB — BASIC METABOLIC PANEL
Anion gap: 10 (ref 5–15)
BUN: 13 mg/dL (ref 8–23)
CO2: 22 mmol/L (ref 22–32)
Calcium: 8.6 mg/dL — ABNORMAL LOW (ref 8.9–10.3)
Chloride: 109 mmol/L (ref 98–111)
Creatinine, Ser: 0.82 mg/dL (ref 0.61–1.24)
GFR, Estimated: >60 mL/min (ref >60)
Glucose, Bld: 139 mg/dL — ABNORMAL HIGH (ref 70–99)
Potassium: 3.7 mmol/L (ref 3.5–5.1)
Sodium: 141 mmol/L (ref 135–145)

## 2020-05-12 LAB — GLUCOSE, CAPILLARY
Glucose-Capillary: 151 mg/dL — ABNORMAL HIGH (ref 70–99)
Glucose-Capillary: 127 mg/dL — ABNORMAL HIGH (ref 70–99)

## 2020-05-12 LAB — CBC
HCT: 40.5 % (ref 39.0–52.0)
Hemoglobin: 13 g/dL (ref 13.0–17.0)
MCH: 27.4 pg (ref 26.0–34.0)
MCHC: 32.1 g/dL (ref 30.0–36.0)
MCV: 85.3 fL (ref 80.0–100.0)
Platelets: 150 10*3/uL (ref 150–400)
RBC: 4.75 MIL/uL (ref 4.22–5.81)
RDW: 13.9 % (ref 11.5–15.5)
WBC: 10.3 10*3/uL (ref 4.0–10.5)
nRBC: 0 % (ref 0.0–0.2)

## 2020-05-12 MED ORDER — LANTUS SOLOSTAR 100 UNIT/ML ~~LOC~~ SOPN: 20.0000 [IU] | PEN_INJECTOR | Freq: Every day | SUBCUTANEOUS | 0 refills | Status: DC

## 2020-05-12 MED ORDER — ASPIRIN 81 MG PO TBEC: 81.0000 mg | DELAYED_RELEASE_TABLET | Freq: Every day | ORAL | 11 refills | Status: DC

## 2020-05-12 MED ORDER — POTASSIUM CHLORIDE CRYS ER 20 MEQ PO TBCR
20.0000 meq | EXTENDED_RELEASE_TABLET | Freq: Once | ORAL | Status: AC
  Administered 2020-05-12: 20 meq via ORAL
  Filled 2020-05-12: qty 1

## 2020-05-12 MED ORDER — ASPIRIN EC 81 MG PO TBEC
81.0000 mg | DELAYED_RELEASE_TABLET | Freq: Every day | ORAL | Status: DC
  Administered 2020-05-12: 81 mg via ORAL
  Filled 2020-05-12: qty 1

## 2020-05-12 NOTE — Progress Notes (Signed)
Physical Therapy Treatment Patient Details Name: Nicholas [name] MRN: [id number] DOB: Mar 19, 1960 Today's Date: 05/12/2020    History of Present Illness Nicholas [name] is a 60 y.o. male with PMH significant for significant for prior hs of L occipital stroke with some hemorrhage in Feb 2020 with very little preipheral vision deficit on the right, hx of GERD, DM2, MI, HLD, peripheral neuropathy who is transferred  From [facility] for further care after presenting as stroke code with aphasia and found to have left greater than right bilateral subdural hematoma.    PT Comments    Session focused on performance on DGI prior to returning home to determine fall risk. Patient scored 22/24 on DGI with mild balance impairments during horizontal and vertical head turns. Educated patient on safety when scanning environment especially in outdoor environment on uneven terrain. Encouraged mobility upon returning home to continue progressing endurance, balance, and strength. Continue to recommend OPPT follow up at discharge.     Follow Up Recommendations  Outpatient PT;Supervision for mobility/OOB     Equipment Recommendations  None recommended by PT    Recommendations for Other Services       Precautions / Restrictions Precautions Precautions: Fall Restrictions Weight Bearing Restrictions: No    Mobility  Bed Mobility         General bed mobility comments: up in recliner on arrival    Transfers Overall transfer level: Modified independent Equipment used: None        Ambulation/Gait Ambulation/Gait assistance: Supervision Gait Distance (Feet): 200 Feet Assistive device: None Gait Pattern/deviations: Drifts right/left;Step-through pattern   Gait velocity interpretation: >4.37 ft/sec, indicative of normal walking speed General Gait Details: Drifts L/R throughout but able to maintain balance throughout. supervision throughout performing DGI.   Stairs Stairs: Yes Stairs  assistance: Supervision Stair Management: No rails;Step to pattern;Forwards Number of Stairs: 2     Wheelchair Mobility    Modified Rankin (Stroke Patients Only) Modified Rankin (Stroke Patients Only) Pre-Morbid Rankin Score: No symptoms Modified Rankin: Moderately severe disability     Balance Overall balance assessment: Mild deficits observed, not formally tested                               Standardized Balance Assessment Standardized Balance Assessment : Dynamic Gait Index   Dynamic Gait Index Level Surface: Normal Change in Gait Speed: Normal Gait with Horizontal Head Turns: Mild Impairment Gait with Vertical Head Turns: Mild Impairment Gait and Pivot Turn: Normal Step Over Obstacle: Normal Step Around Obstacles: Normal Steps: Normal Total Score: 22      Cognition Arousal/Alertness: Awake/alert Behavior During Therapy: WFL for tasks assessed/performed Overall Cognitive Status: Within Functional Limits for tasks assessed                                        Exercises      General Comments        Pertinent Vitals/Pain Pain Assessment: No/denies pain    Home Living                      Prior Function            PT Goals (current goals can now be found in the care plan section) Acute Rehab PT Goals Patient Stated Goal: to go home PT Goal Formulation: With patient Time  For Goal Achievement: 05/24/20 Potential to Achieve Goals: Good Progress towards PT goals: Progressing toward goals    Frequency    Min 4X/week      PT Plan Current plan remains appropriate    Co-evaluation              AM-PAC PT "6 Clicks" Mobility   Outcome Measure  Help needed turning from your back to your side while in a flat bed without using bedrails?: None Help needed moving from lying on your back to sitting on the side of a flat bed without using bedrails?: None Help needed moving to and from a bed to a chair  (including a wheelchair)?: None Help needed standing up from a chair using your arms (e.g., wheelchair or bedside chair)?: None Help needed to walk in hospital room?: A Little Help needed climbing 3-5 steps with a railing? : A Little 6 Click Score: 22    End of Session Equipment Utilized During Treatment: Gait belt Activity Tolerance: Patient tolerated treatment well Patient left: in chair;with call bell/phone within reach;with family/visitor present Nurse Communication: Mobility status PT Visit Diagnosis: Other abnormalities of gait and mobility (R26.89);Other symptoms and signs involving the nervous system (R29.898)     Time: 1038-1050 PT Time Calculation (min) (ACUTE ONLY): 12 min  Charges:  $Gait Training: 8-22 mins                     Jamari Diana A. Gilford Rile PT, DPT Acute Rehabilitation Services Pager [phone] Office [phone]    Linna Hoff 05/12/2020, 12:48 PM

## 2020-05-12 NOTE — Progress Notes (Signed)
Patient is ready to discharge. IV and telemetry removed, all belongings at bedside. Discharge paperwork reviewed and confirmed understanding through teach-back. Patient will be transported home by a family member, taken off the unit to discharge by nurse in a wheelchair.

## 2020-05-12 NOTE — Progress Notes (Addendum)
Occupational Therapy Treatment Patient Details Name: Nicholas [name] MRN: [id number] DOB: 1960/12/25 Today's Date: 05/12/2020    History of present illness Nicholas [name] is a 60 y.o. male with PMH significant for significant for prior hs of L occipital stroke with some hemorrhage in Feb 2020 with very little preipheral vision deficit on the right, hx of GERD, DM2, MI, HLD, peripheral neuropathy who is transferred  From [facility] for further care after presenting as stroke code with aphasia and found to have left greater than right bilateral subdural hematoma.   OT comments  Pt. Seen for skilled OT treatment.  Able to complete bed mobility and in room ambulation for simulated toileting and grooming tasks S to min guard a.  One LOB noted to R side during ambulation out of b.room but pt. Self corrected.  Note d/c likely over the weekend.   Follow Up Recommendations  CIR    Equipment Recommendations       Recommendations for Other Services Rehab consult    Precautions / Restrictions Precautions Precautions: Fall       Mobility Bed Mobility Overal bed mobility: Modified Independent Bed Mobility: Supine to Sit     Supine to sit: Modified independent (Device/Increase time)          Transfers Overall transfer level: Needs assistance Equipment used: None Transfers: Sit to/from [facility] Sit to Stand: Supervision Stand pivot transfers: Min guard       General transfer comment: lob to R noted when exiting the b.room. pt. able to self correct. utilizing intermittent furniture walking. reviewed not holding onto (soap dishes, towel rods, any moving object)    Balance                                           ADL either performed or assessed with clinical judgement   ADL Overall ADL's : Needs assistance/impaired     Grooming: Min guard;Standing Grooming Details (indicate cue type and reason): stood at counter simulated but  declined all grooming tasks                 Toilet Transfer: Min guard;Ambulation Toilet Transfer Details (indicate cue type and reason): completed ambulation into b.room, simulated task but declined need for actual use         Functional mobility during ADLs: Min guard General ADL Comments: LOB to R when exiting the b.room but able to self correct.  utilized intermittent furniture walking.  reviewed safety with him on what is deemed "okay" to hold onto for support and what is not     Vision       Perception     Praxis      Cognition Arousal/Alertness: Awake/alert Behavior During Therapy: WFL for tasks assessed/performed Overall Cognitive Status: Within Functional Limits for tasks assessed                                          Exercises     Shoulder Instructions       General Comments      Pertinent Vitals/ Pain       Pain Assessment: No/denies pain  Home Living  Prior Functioning/Environment              Frequency  Min 2X/week        Progress Toward Goals  OT Goals(current goals can now be found in the care plan section)  Progress towards OT goals: Progressing toward goals     Plan      Co-evaluation                 AM-PAC OT "6 Clicks" Daily Activity     Outcome Measure   Help from another person eating meals?: A Little Help from another person taking care of personal grooming?: A Little Help from another person toileting, which includes using toliet, bedpan, or urinal?: A Little Help from another person bathing (including washing, rinsing, drying)?: A Little Help from another person to put on and taking off regular upper body clothing?: A Lot Help from another person to put on and taking off regular lower body clothing?: A Little 6 Click Score: 17    End of Session Equipment Utilized During Treatment: Gait belt  OT Visit Diagnosis: Unsteadiness  on feet (R26.81);Cognitive communication deficit (R41.841) Symptoms and signs involving cognitive functions: Nontraumatic SAH   Activity Tolerance Patient tolerated treatment well   Patient Left in chair;with call bell/phone within reach;with chair alarm set   Nurse Communication          Time: [phone] OT Time Calculation (min): 11 min  Charges: OT General Charges $OT Visit: 1 Visit OT Treatments $Self Care/Home Management : 8-22 mins  Sonia Baller, COTA/L Acute Rehabilitation [phone]   05/12/2020, 12:39 PM

## 2020-05-12 NOTE — Progress Notes (Incomplete)
PROGRESS NOTE    Nicholas [name]  KKX:[id number] DOB: 22-Nov-1960 DOA: 05/09/2020 PCP: Sharilyn Sites, MD   Chief Complaint  Patient presents with  . Code Stroke  Brief Narrative: 60 year old male with a PMH of 2020 left occipital stroke with hemorrhage who presents to the ED on 4/13 with right sided vision loss and confusion.  Head CT showed small subdural hematoma.  MRI showed subdural hematoma and no infarction.  EEG showed no epileptifom activity on Keppra and Gabapentin.  Neurosurgery Dr. Ronnald Ramp does not recommend any surgical intervention at this time.  PT recommends outpatient physical therapy.   Subjective: Seen and examined this morning wife is at the bedside He appears significantly improved alert awake speech clear following commands appropriately. 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 Ronnald Ramp from neurosurgery no active intervention needed.  Being followed by neurology, CT angio head, neck and CT head- SDH+, no LVVP, MRI showed small bilateral chronic appearing subdural hematoma.  Remains on LTM EEG, and on Keppra and clinically much improved.  Continue plan as per neurology continue PT OT.  CAD s/p DES in 2010 History of CVA On aspirin and Plavix at home and currently held at due to SDH-resume once cleared by neurology. Addendum discussed with neurology-would prefer monotherapy antiplatelets.  Discussed with Dr. Charlann Lange not need aspirin and Plavix both and okay to continue on aspirin alone. Asa 81 mg on d/c.  T2DM: Well-controlled, continue Lantus 20 units ( down from 40 units), sliding scale insulin.  A1c poorly controlled at 8.0. Recent Labs  Lab 05/11/20 1339 05/11/20 1656 05/11/20 2104 05/11/20 2357 05/12/20 0424  GLUCAP 157* 169* 219* 151* 151*   GERD: On Protonix Essential  hypertension: BP stable 90s to 120s.  Goal < sbp 140, cont prn meds Hyperlipidemia: Lipid panel done and LDL 90  Tobacco abuse: Nicotine patch ordered cessation advised.  Diet Order            Diet heart healthy/carb modified Room service appropriate? Yes; Fluid consistency: Thin  Diet effective now                Patient's Body mass index is 23.86 kg/m.  DVT prophylaxis: SCDs Start: 05/09/20 1816 Code Status:   Code Status: Full Code  Family Communication: plan of care discussed with patient and his wife  at bedside 4/4.  Status is: Inpatient Remains inpatient appropriate because:Inpatient level of care appropriate due to severity of illness Remains hospitalized for ongoing management of subdural hematoma , further work-up by neurology  Dispo: The patient is from: Home              Anticipated d/c is to: CIR course home health depending upon how he does and once okay with neurology              Patient currently is not medically stable to d/c.   Difficult to place patient No Unresulted Labs (From admission, onward)          Start     Ordered   05/13/20 1696  Basic metabolic panel  Daily,   R     Question:  Specimen collection method  Answer:  Lab=Lab collect   05/12/20 0743   05/13/20 0500  CBC  Daily,   R     Question:  Specimen collection method  Answer:  Lab=Lab collect  05/12/20 0743         Medications reviewed:  Scheduled Meds: . gabapentin  100 mg Oral Daily  . insulin aspart  0-6 Units Subcutaneous Q4H  . insulin glargine  20 Units Subcutaneous Q2200  . nicotine  14 mg Transdermal Daily  . pantoprazole  40 mg Oral Daily   Continuous Infusions: . sodium chloride 50 mL/hr at 05/12/20 0611  . levETIRAcetam Stopped (05/11/20 2140)  . promethazine (PHENERGAN) injection (IM or IVPB) Stopped (05/09/20 2050)    Consultants:see note  Procedures:see note  Antimicrobials: Anti-infectives (From admission, onward)   None     Culture/Microbiology No results  found for: SDES, SPECREQUEST, CULT, REPTSTATUS  Other culture-see note  Objective: Vitals: Today's Vitals   05/11/20 1658 05/11/20 1949 05/12/20 0002 05/12/20 0424  BP: 99/65 109/67 106/65 105/70  Pulse: 84 88 85 85  Resp: 18 18 16 16   Temp: 98.5 F (36.9 C) 97.9 F (36.6 C) 97.7 F (36.5 C) 98 F (36.7 C)  TempSrc: Oral Oral Oral Oral  SpO2: 98% 96% 96% 96%  Weight:      Height:      PainSc:  0-No pain      Intake/Output Summary (Last 24 hours) at 05/12/2020 0744 Last data filed at 05/12/2020 0611 Gross per 24 hour  Intake 1462.11 ml  Output 300 ml  Net 1162.11 ml   Filed Weights   05/09/20 2330  Weight: 73.3 kg   Weight change:   Intake/Output from previous day: 04/15 0701 - 04/16 0700 In: 1462.1 [I.V.:1102.1; IV Piggyback:360] Out: 300 [Urine:300] Intake/Output this shift: No intake/output data recorded. Filed Weights   05/09/20 2330  Weight: 73.3 kg    Examination: General exam: AAOx3, NAD, weak appearing. HEENT:Oral mucosa moist, Ear/Nose WNL grossly, dentition normal. Respiratory system: bilaterally clear,no wheezing or crackles,no use of accessory muscle Cardiovascular system: S1 & S2 +, No JVD,. Gastrointestinal system: Abdomen soft, NT,ND, BS+ Nervous System:Alert, awake, moving extremities and grossly nonfocal on upper and lower extremities speech clear, no aphasia today Extremities: No edema, distal peripheral pulses palpable.  Skin: No rashes,no icterus. MSK: Normal muscle bulk,tone, power  Data Reviewed: I have personally reviewed following labs and imaging studies CBC: Recent Labs  Lab 05/09/20 1347 05/11/20 0224 05/12/20 0216  WBC 10.5 11.7* 10.3  NEUTROABS 7.6  --   --   HGB 14.9 14.1 13.0  HCT 45.3 42.7 40.5  MCV 83.6 83.7 85.3  PLT 183 166 865   Basic Metabolic Panel: Recent Labs  Lab 05/09/20 1347 05/11/20 0224 05/12/20 0216  NA 136 138 141  K 4.0 3.7 3.7  CL 102 108 109  CO2 24 27 22   GLUCOSE 194* 113* 139*  BUN 10 12  13   CREATININE 0.71 0.83 0.82  CALCIUM 9.0 8.8* 8.6*   GFR: Estimated Creatinine Clearance: 94.6 mL/min (by C-G formula based on SCr of 0.82 mg/dL). Liver Function Tests: Recent Labs  Lab 05/09/20 1347  AST 20  ALT 21  ALKPHOS 60  BILITOT 0.7  PROT 7.2  ALBUMIN 4.1   No results for input(s): LIPASE, AMYLASE in the last 168 hours. No results for input(s): AMMONIA in the last 168 hours. Coagulation Profile: Recent Labs  Lab 05/09/20 1347  INR 1.0   Cardiac Enzymes: No results for input(s): CKTOTAL, CKMB, CKMBINDEX, TROPONINI in the last 168 hours. BNP (last 3 results) No results for input(s): PROBNP in the last 8760 hours. HbA1C: Recent Labs    05/09/20 1347  HGBA1C 8.0*  CBG: Recent Labs  Lab 05/11/20 1339 05/11/20 1656 05/11/20 2104 05/11/20 2357 05/12/20 0424  GLUCAP 157* 169* 219* 151* 151*   Lipid Profile: Recent Labs    05/11/20 0224  CHOL 138  HDL 28*  LDLCALC 90  TRIG 98  CHOLHDL 4.9   Thyroid Function Tests: No results for input(s): TSH, T4TOTAL, FREET4, T3FREE, THYROIDAB in the last 72 hours. Anemia Panel: No results for input(s): VITAMINB12, FOLATE, FERRITIN, TIBC, IRON, RETICCTPCT in the last 72 hours. Sepsis Labs: No results for input(s): PROCALCITON, LATICACIDVEN in the last 168 hours.  Recent Results (from the past 240 hour(s))  Resp Panel by RT-PCR (Flu A&B, Covid) Nasopharyngeal Swab     Status: None   Collection Time: 05/09/20  3:00 PM   Specimen: Nasopharyngeal Swab; Nasopharyngeal(NP) swabs in vial transport medium  Result Value Ref Range Status   SARS Coronavirus 2 by RT PCR NEGATIVE NEGATIVE Final    Comment: (NOTE) SARS-CoV-2 target nucleic acids are NOT DETECTED.  The SARS-CoV-2 RNA is generally detectable in upper respiratory specimens during the acute phase of infection. The lowest concentration of SARS-CoV-2 viral copies this assay can detect is 138 copies/mL. A negative result does not preclude SARS-Cov-2 infection  and should not be used as the sole basis for treatment or other patient management decisions. A negative result may occur with  improper specimen collection/handling, submission of specimen other than nasopharyngeal swab, presence of viral mutation(s) within the areas targeted by this assay, and inadequate number of viral copies(<138 copies/mL). A negative result must be combined with clinical observations, patient history, and epidemiological information. The expected result is Negative.  Fact Sheet for Patients:  EntrepreneurPulse.com.au  Fact Sheet for Healthcare Providers:  IncredibleEmployment.be  This test is no t yet approved or cleared by the [place] FDA and  has been authorized for detection and/or diagnosis of SARS-CoV-2 by FDA under an Emergency Use Authorization (EUA). This EUA will remain  in effect (meaning this test can be used) for the duration of the COVID-19 declaration under Section 564(b)(1) of the Act, 21 U.S.C.section 360bbb-3(b)(1), unless the authorization is terminated  or revoked sooner.       Influenza A by PCR NEGATIVE NEGATIVE Final   Influenza B by PCR NEGATIVE NEGATIVE Final    Comment: (NOTE) The Xpert Xpress SARS-CoV-2/FLU/RSV plus assay is intended as an aid in the diagnosis of influenza from Nasopharyngeal swab specimens and should not be used as a sole basis for treatment. Nasal washings and aspirates are unacceptable for Xpert Xpress SARS-CoV-2/FLU/RSV testing.  Fact Sheet for Patients: EntrepreneurPulse.com.au  Fact Sheet for Healthcare Providers: IncredibleEmployment.be  This test is not yet approved or cleared by the [place] FDA and has been authorized for detection and/or diagnosis of SARS-CoV-2 by FDA under an Emergency Use Authorization (EUA). This EUA will remain in effect (meaning this test can be used) for the duration of the COVID-19 declaration  under Section 564(b)(1) of the Act, 21 U.S.C. section 360bbb-3(b)(1), unless the authorization is terminated or revoked.  Performed at [facility], [address] [place], [place] [address]      Radiology Studies: MR BRAIN WO CONTRAST  Result Date: 05/10/2020 CLINICAL DATA:  Stroke follow-up EXAM: MRI HEAD WITHOUT CONTRAST TECHNIQUE: Multiplanar, multiecho pulse sequences of the brain and surrounding structures were obtained without intravenous contrast. COMPARISON:  CT head 05/09/2020.  MRI head 03/03/2018 FINDINGS: Brain: Negative for acute infarct. Few small white matter hyperintensities bilaterally. Negative for mass lesion Small bilateral extra-axial fluid collections.  These are intermediate density on CT and are hyperdense on FLAIR. This measures approximately 3 mm on the right and 6 mm on the left. Findings compatible with small chronic subdural hemorrhage. Vascular: Normal arterial flow voids Skull and upper cervical spine: No focal skeletal lesion. Sinuses/Orbits: Mucosal edema left maxillary sinus.  Normal orbit Other: None IMPRESSION: Negative for acute infarct Small bilateral extra-axial fluid collection compatible with chronic subdural hematoma. Electronically Signed   By: Franchot Gallo M.D.   On: 05/10/2020 13:08   Overnight EEG with video  Result Date: 05/10/2020 Lora Havens, MD     05/11/2020  9:34 AM Patient Name: RAFIK KOPPEL MRN: [id number] Epilepsy Attending: Lora Havens Referring Physician/Provider: Dr Su Monks Duration: 05/10/2020 0029 to 05/11/2020 0029 Patient history: 60 year old male with left occipital stroke with some hemorrhage and 2020, presented with acute onset confusion and worsening of baseline right visual loss.  EEG to evaluate for seizures. Level of alertness: Awake, asleep AEDs during EEG study: Keppra, gabapentin Technical aspects: This EEG study was done with scalp electrodes positioned according to the 10-20 International system of electrode  placement. Electrical activity was acquired at a sampling rate of 500Hz  and reviewed with a high frequency filter of 70Hz  and a low frequency filter of 1Hz . EEG data were recorded continuously and digitally stored. Description: The posterior dominant rhythm consists of 8 Hz activity of moderate voltage (25-35 uV) seen predominantly in posterior head regions, asymmetric (L<R) and reactive to eye opening and eye closing. Sleep was characterized by vertex waves, sleep spindles (12 to 14 Hz), maximal frontocentral region.  EEG showed continuous 3 to 5 Hz theta-delta slowing in left temporo-parietal region.  Hyperventilation and photic stimulation were not performed.   ABNORMALITY -Continuous slow, left temporo-parietal region IMPRESSION: This study is suggestive of cortical dysfunction arising from left temporo-parietal region, likely secondary to underlying structural abnormality. No seizures or epileptiform discharges were seen throughout the recording. Lora Havens     LOS: 3 days   George Hugh, MD [facility]  05/12/2020, 7:44 AM

## 2020-05-12 NOTE — Discharge Instructions (Signed)
Subdural Hematoma  A subdural hematoma is a collection of blood between the brain and its outer covering (dura). As the amount of blood increases, pressure builds on the brain. There are two types of subdural hematomas:  Acute. This type develops shortly after a hard, direct hit to the head and causes blood to collect very quickly. This is a medical emergency. If it is not diagnosed and treated quickly, it can lead to severe brain injury or death.  Chronic. This is when bleeding develops more slowly, over weeks or months. In some cases, this type does not cause symptoms. What are the causes? This condition is caused by bleeding (hemorrhage) from a broken (ruptured) blood vessel. In most cases, a blood vessel ruptures and bleeds because of a head injury, such as from a hard, direct hit. Head injuries can happen in car accidents, falls, assaults, or while playing sports. In rare cases, a hemorrhage can happen without a known cause (spontaneously), especially if you take blood thinners (anticoagulants). What increases the risk? This condition is more likely to develop in:  Older people.  Infants.  People who take blood thinners.  People who have head injuries.  People who abuse alcohol. What are the signs or symptoms? Symptoms of this condition can vary depending on the size of the hematoma. Symptoms can be mild, severe, or life-threatening. They include:  Headaches.  Nausea or vomiting.  Changes in vision, such as double vision or loss of vision.  Changes in speech or trouble understanding what people say.  Loss of balance or trouble walking.  Weakness, numbness, or tingling in the arms or legs, especially on one side of the body.  Seizures.  Change in personality.  Increased sleepiness.  Memory loss.  Loss of consciousness.  Coma. Symptoms of acute subdural hematoma can develop over minutes or hours. Symptoms of chronic subdural hematoma may develop over weeks or  months. How is this diagnosed? This condition is diagnosed based on the results of:  A physical exam.  Tests of strength, reflexes, coordination, senses, manner of walking (gait), and facial and eye movements (neurological exam).  Imaging tests, such as an MRI or a CT scan. How is this treated? Treatment for this condition depends on the type of hematoma and how severe it is. Treatment for acute hematoma may include:  Emergency surgery to drain blood or remove a blood clot.  Medicines that help the body get rid of excess fluids (diuretics). These may help to reduce pressure in the brain.  Assisted breathing (ventilation). Treatment for chronic hematoma may include:  Observation and bed rest at the hospital.  Surgery. If you take blood thinners, you may need to stop taking them for a short time. You may also be given anti-seizure (anticonvulsant) medicine. Sometimes, no treatment is needed for chronic subdural hematoma. Follow these instructions at home: Activity  Avoid situations where you could injure your head again, such as in competitive sports, downhill snow sports, and horseback riding. Do not do these activities until your health care provider approves. ? Wear protective gear, such as a helmet, when participating in activities such as biking or contact sports.  Avoid too much visual stimulation while recovering. This means limiting how much you read and limiting your screen time on a smart phone, tablet, computer, or TV.  Rest as told by your health care provider. Rest helps the brain heal.  Try to avoid activities that cause physical or mental stress. Return to work or school as told by  your health care provider.  Do not lift anything that is heavier than 5 lb (2.3 kg), or the limit you are told, until your health care provider says that it is safe.  Do not drive, ride a bike, or use heavy machinery until your health care provider approves.  Always wear your seat belt  when you are in a motor vehicle. Alcohol use  Do not drink alcohol if your health care provider tells you not to drink.  If you drink alcohol, limit how much you use to: ? 0-1 drink a day for women. ? 0-2 drinks a day for men. General instructions  Monitor your symptoms, and ask people around you to do the same. Recovery from brain injuries varies. Talk with your health care provider about what to expect.  Take over-the-counter and prescription medicines only as told by your health care provider. Do not take blood thinners or NSAIDs unless your health care provider approves. These include aspirin, ibuprofen, naproxen, and warfarin.  Keep your home environment safe to reduce the risk of falling.  Keep all follow-up visits as told by your health care provider. This is important. Where to find more information  [employer] of Neurological Disorders and Stroke: MasterBoxes.it  American Academy of Neurology (AAN): http://keith.biz/  Brain Injury Association of [place]: www.biausa.org Get help right away if you:  Are taking blood thinners and you fall or you experience minor trauma to the head. If you take any blood thinners, even a very small injury can cause a subdural hematoma.  Have a bleeding disorder and you fall or you experience minor trauma to the head.  Develop any of the following symptoms after a head injury: ? Clear fluid draining from your nose or ears. ? Nausea or vomiting. ? Changes in speech or trouble understanding what people say. ? Seizures. ? Drowsiness or a decrease in alertness. ? Double vision. ? Numbness or inability to move (paralysis) in any part of your body. ? Difficulty walking or poor coordination. ? Difficulty thinking. ? Confusion or forgetfulness. ? Personality changes. ? Irrational or aggressive behavior. These symptoms may represent a serious problem that is an emergency. Do not wait to see if the symptoms will go away. Get medical help  right away. Call your local emergency services (911 in the [place] Do not drive yourself to the hospital. Summary  A subdural hematoma is a collection of blood between the brain and its outer covering (dura).  Treatment for this condition depends on what type of subdural hematoma you have and how severe it is.  Symptoms can vary from mild to severe to life-threatening.  Monitor your symptoms, and ask others around you to do the same. This information is not intended to replace advice given to you by your health care provider. Make sure you discuss any questions you have with your health care provider. Document Revised: 12/14/2017 Document Reviewed: 12/14/2017 Elsevier Patient Education  2021 [employer], Adult There are many types of head injuries. They can be as minor as a small bump. Some head injuries can be worse. Worse injuries include:  A strong hit to the head that shakes the brain back and forth, causing damage (concussion).  A bruise (contusion) of the brain. This means there is bleeding in the brain that can cause swelling.  A cracked skull (skull fracture).  Bleeding in the brain that gathers, gets thick (makes a clot), and forms a bump (hematoma). Most problems from a head injury  come in the first 24 hours. However, you may still have side effects up to 7-10 days after your injury. It is important to watch your condition for any changes. You may need to be watched in the emergency department or urgent care, or you may need to stay in the hospital. What are the causes? There are many possible causes of a head injury. A serious head injury may be caused by:  A car accident.  Bicycle or motorcycle accidents.  Sports injuries.  Falls.  Being hit by an object. What are the signs or symptoms? Symptoms of a head injury include a bruise, bump, or bleeding where the injury happened. Other physical symptoms may include:  Headache.  Feeling like you may  vomit (nauseous) or vomiting.  Dizziness.  Blurred or double vision.  Being uncomfortable around bright lights or loud noises.  Shaking movements that you cannot control (seizures).  Feeling tired.  Trouble being woken up.  Fainting or loss of consciousness. Mental or emotional symptoms may include:  Feeling grumpy or cranky.  Confusion and memory problems.  Having trouble paying attention or concentrating.  Changes in eating or sleeping habits.  Feeling worried or nervous (anxious).  Feeling sad (depressed). How is this treated? Treatment for this condition depends on how severe the injury is and the type of injury you have. The main goal is to prevent problems and to allow the brain time to heal. Mild head injury If you have a mild head injury, you may be sent home, and treatment may include:  Being watched. A responsible adult should stay with you for 24 hours after your injury and check on you often.  Physical rest.  Brain rest.  Pain medicines. Severe head injury If you have a severe head injury, treatment may include:  Being watched closely. This includes staying in the hospital.  Medicines to: ? Help with pain. ? Prevent seizures. ? Help with brain swelling.  Protecting your airway and using a machine that helps you breathe (ventilator).  Treatments to watch for and manage swelling inside the brain.  Brain surgery. This may be needed to: ? Remove a collection of blood or blood clots. ? Stop the bleeding. ? Remove a part of the skull. This allows room for the brain to swell. Follow these instructions at home: Activity  Rest.  Avoid activities that are hard or tiring.  Make sure you get enough sleep.  Let your brain rest. Do this by limiting activities that need a lot of thought or attention, such as: ? Watching TV. ? Playing memory games and puzzles. ? Job-related work or homework. ? Working on [employer], and  texting.  Avoid activities that could cause another head injury until your doctor says it is okay. This includes playing sports. Having another head injury, especially before the first one has healed, can be dangerous.  Ask your doctor when it is safe for you to go back to your normal activities, such as work or school. Ask your doctor for a step-by-step plan for slowly going back to your normal activities.  Ask your doctor when you can drive, ride a bicycle, or use heavy machinery. Do not do these activities if you are dizzy. Lifestyle  Do not drink alcohol until your doctor says it is okay.  Do not use drugs.  If it is harder than usual to remember things, write them down.  If you are easily distracted, try to do one thing at a time.  Talk with family members or close friends when making important decisions.  Tell your friends, family, a trusted co-worker, and work [employer] about your injury, symptoms, and limits (restrictions). Have them watch for any problems that are new or getting worse.   General instructions  Take over-the-counter and prescription medicines only as told by your doctor.  Have someone stay with you for 24 hours after your head injury. This person should watch you for any changes in your symptoms and be ready to get help.  Keep all follow-up visits as told by your doctor. This is important. How is this prevented?  Work on [employer]. This can help you avoid falls.  Wear a seat belt when you are in a moving vehicle.  Wear a helmet when you: ? Ride a bicycle. ? Ski. ? Do any other sport or activity that has a risk of injury.  If you drink alcohol: ? Limit how much you use to:  0-1 drink a day for nonpregnant women.  0-2 drinks a day for men. ? Be aware of how much alcohol is in your drink. In the [place] one drink equals one 12 oz bottle of beer (355 mL), one 5 oz glass of wine (148 mL), or one 1 oz glass of hard liquor (44 mL).  Make  your home safer by: ? Getting rid of clutter from the floors and stairs. This includes things that can make you trip. ? Using grab bars in bathrooms and handrails by stairs. ? Placing non-slip mats on floors and in bathtubs. ? Putting more light in dim areas. Where to find more information  Centers for Disease Control and Prevention: http://www.wolf.info/ Get help right away if:  You have: ? A very bad headache that is not helped by medicine. ? Trouble walking or weakness in your arms and legs. ? Clear or bloody fluid coming from your nose or ears. ? Changes in how you see (vision). ? A seizure. ? More confusion or more grumpy moods.  Your symptoms get worse.  You are sleepier than normal and have trouble staying awake.  You lose your balance.  The black centers of your eyes (pupils) change in size.  Your speech is slurred.  Your dizziness gets worse.  You vomit. These symptoms may be an emergency. Do not wait to see if the symptoms will go away. Get medical help right away. Call your local emergency services (911 in the [place] Do not drive yourself to the hospital. Summary  Head injuries can be as minor as a small bump. Some head injuries can be worse.  Treatment for this condition depends on how severe the injury is and the type of injury you have.  Have someone stay with you for 24 hours after your head injury.  Ask your doctor when it is safe for you to go back to your normal activities, such as work or school.  To prevent a head injury, wear a seat belt in a car, wear a helmet when you use a bicycle, limit your alcohol use, and make your home safer. This information is not intended to replace advice given to you by your health care provider. Make sure you discuss any questions you have with your health care provider. Document Revised: 11/26/2018 Document Reviewed: 11/26/2018 Elsevier Patient Education  2021 [employer].

## 2020-05-13 NOTE — Discharge Summary (Signed)
Physician Discharge Summary  MARSEAN ELKHATIB ZOX:[id number] DOB: [birth year] DOA: 05/09/2020  PCP: Sharilyn Sites, MD  Admit date: 05/09/2020 Discharge date: 05/13/2020  Admitted From: Home Disposition:  Home with outpatient PT  Recommendations for Outpatient Follow-up:  1. Follow up with PCP in 1-2 weeks. 2. Follow up with Neurology in 1-2 weeks.  Home Health: None  Equipment/Devices: None  Discharge Condition: Stable Code Status:   Code Status: Prior Diet recommendation:  Diet Order            Diet - low sodium heart healthy                  Brief/Interim Summary:  60 year old male with a PMH of CAD s/p 2010 DES and history of 2020 left occipital stroke with hemorrhage who presented to the ED on 4/13 with right sided vision loss and confusion after sustaining mild trauma to the head few days earlier.  Head CT showed a small subdural hematoma.  MRI showed subdural hematoma that measures 3 mm on the right and 6 mm on the left and deemed this a small chronic subdural hemorrhage.  There was no evidence of an infarction.  Neurology placed patient on Keppra and Gabapentin.  EEG showed no epileptifom activity on Keppra and Gabapentin.  Neurosurgery Dr. Ronnald Ramp does not recommend any surgical intervention at this time.  PT recommends outpatient physical therapy.    Patient's neurological symptoms resolved after a few days of treatment. Patient was discharged on Aspirin and Statin given history of stroke and will follow up with Neurology outpatient.  Keppra was discontinued.  Patient will continue Gabapentin for neuropathic pain.  Patient was discharged in stable condition and will follow up with PCP and Neurology outpatient.  He will continue physical therapy outpatient.  Discharge Diagnoses:  Active Problems:   CORONARY ATHEROSCLEROSIS NATIVE CORONARY ARTERY   GERD   Type 2 diabetes mellitus with vascular disease (HCC)   Essential hypertension, benign   Hyperlipidemia   Hyperlipemia    Diabetic peripheral neuropathy ([place])   Subdural hematoma ([place])   Receptive aphasia    Consults:  Neurology  Subjective: Patient was discharged in stable condition. Discharge Exam: Vitals:   05/12/20 0424 05/12/20 0907  BP: 105/70 (!) 102/56  Pulse: 85 73  Resp: 16 18  Temp: 98 F (36.7 C) (!) 97.5 F (36.4 C)  SpO2: 96% 98%   General: Pt is alert, awake, not in acute distress Cardiovascular: RRR, S1/S2 +, no rubs, no gallops Respiratory: CTA bilaterally, no wheezing, no rhonchi Abdominal: Soft, NT, ND, bowel sounds + Extremities: no edema, no cyanosis  Discharge Instructions  Discharge Instructions    Ambulatory referral to Occupational Therapy   Complete by: As directed    Ambulatory referral to Physical Therapy   Complete by: As directed    Ambulatory referral to Speech Therapy   Complete by: As directed    Diet - low sodium heart healthy   Complete by: As directed    Discharge instructions   Complete by: As directed    Follow up with Cardiology and discuss discontinuation of Plavix. Follow up with Neurology and discuss management of subdural hematoma.   Increase activity slowly   Complete by: As directed      Allergies as of 05/12/2020   No Known Allergies     Medication List    STOP taking these medications   clopidogrel 75 MG tablet Commonly known as: PLAVIX     TAKE these medications   albuterol  108 (90 Base) MCG/ACT inhaler Commonly known as: VENTOLIN HFA Inhale 1-2 puffs into the lungs every 6 (six) hours as needed for wheezing or shortness of breath.   aspirin 81 MG EC tablet Take 1 tablet (81 mg total) by mouth daily. Swallow whole. What changed:   medication strength  how much to take  additional instructions   BD Pen Needle Nano U/F 32G X 4 MM Misc Generic drug: Insulin Pen Needle USE AS DIRECTED FOUR TIMES DAILY.   FreeStyle Libre 2 Reader Kerrin Mo As directed   [employer] 2 Sensor Misc 1 Piece by Does not apply route  every 14 (fourteen) days.   gabapentin 100 MG capsule Commonly known as: NEURONTIN Take 100 mg by mouth daily.   Lantus SoloStar 100 UNIT/ML Solostar Pen Generic drug: insulin glargine Inject 20 Units into the skin at bedtime. What changed: See the new instructions.   nitroGLYCERIN 0.4 MG SL tablet Commonly known as: NITROSTAT DISSOLVE 1 TABLET UNDER TONGUE EVERY 5 MINUTES UP TO 15 MIN FOR CHESTPAIN. IF NO RELIEF CALL 911. What changed:   how much to take  how to take this  when to take this  reasons to take this  additional instructions   pantoprazole 40 MG tablet Commonly known as: PROTONIX Take 40 mg by mouth daily.   traMADol 50 MG tablet Commonly known as: ULTRAM Take 1 tablet (50 mg total) by mouth every 6 (six) hours as needed for moderate pain. What changed: Another medication with the same name was removed. Continue taking this medication, and follow the directions you see here.       Follow-up Information    [facility] Follow up.   Specialty: Rehabilitation Why: The outpatient rehab will contact you for the first appointment Contact information: [place] [phone] [place] [phone]             No Known Allergies  The results of significant diagnostics from this hospitalization (including imaging, microbiology, ancillary and laboratory) are listed below for reference.    Microbiology: Recent Results (from the past 240 hour(s))  Resp Panel by RT-PCR (Flu A&B, Covid) Nasopharyngeal Swab     Status: None   Collection Time: 05/09/20  3:00 PM   Specimen: Nasopharyngeal Swab; Nasopharyngeal(NP) swabs in vial transport medium  Result Value Ref Range Status   SARS Coronavirus 2 by RT PCR NEGATIVE NEGATIVE Final    Comment: (NOTE) SARS-CoV-2 target nucleic acids are NOT DETECTED.  The SARS-CoV-2 RNA is generally detectable in upper respiratory specimens during the acute  phase of infection. The lowest concentration of SARS-CoV-2 viral copies this assay can detect is 138 copies/mL. A negative result does not preclude SARS-Cov-2 infection and should not be used as the sole basis for treatment or other patient management decisions. A negative result may occur with  improper specimen collection/handling, submission of specimen other than nasopharyngeal swab, presence of viral mutation(s) within the areas targeted by this assay, and inadequate number of viral copies(<138 copies/mL). A negative result must be combined with clinical observations, patient history, and epidemiological information. The expected result is Negative.  Fact Sheet for Patients:  EntrepreneurPulse.com.au  Fact Sheet for Healthcare Providers:  IncredibleEmployment.be  This test is no t yet approved or cleared by the [place] FDA and  has been authorized for detection and/or diagnosis of SARS-CoV-2 by FDA under an Emergency Use Authorization (EUA). This EUA will remain  in effect (meaning this test can  be used) for the duration of the COVID-19 declaration under Section 564(b)(1) of the Act, 21 U.S.C.section 360bbb-3(b)(1), unless the authorization is terminated  or revoked sooner.       Influenza A by PCR NEGATIVE NEGATIVE Final   Influenza B by PCR NEGATIVE NEGATIVE Final    Comment: (NOTE) The Xpert Xpress SARS-CoV-2/FLU/RSV plus assay is intended as an aid in the diagnosis of influenza from Nasopharyngeal swab specimens and should not be used as a sole basis for treatment. Nasal washings and aspirates are unacceptable for Xpert Xpress SARS-CoV-2/FLU/RSV testing.  Fact Sheet for Patients: EntrepreneurPulse.com.au  Fact Sheet for Healthcare Providers: IncredibleEmployment.be  This test is not yet approved or cleared by the [place] FDA and has been authorized for detection and/or diagnosis of  SARS-CoV-2 by FDA under an Emergency Use Authorization (EUA). This EUA will remain in effect (meaning this test can be used) for the duration of the COVID-19 declaration under Section 564(b)(1) of the Act, 21 U.S.C. section 360bbb-3(b)(1), unless the authorization is terminated or revoked.  Performed at [facility], [address], [place], [place] [address]     Procedures/Studies: CT HEAD WO CONTRAST  Result Date: 05/09/2020 CLINICAL DATA:  Worsening headache history of subdural hematoma EXAM: CT HEAD WITHOUT CONTRAST TECHNIQUE: Contiguous axial images were obtained from the base of the skull through the vertex without intravenous contrast. COMPARISON:  CT brain 05/09/2020, MRI 03/03/2018 FINDINGS: Brain: No large vessel territorial infarction or intracranial mass. Intermediate density left greater than right bilateral subdural hematomas. This measures up to 10 mm maximum thickness on the left and 6 mm maximum thickness on the right, grossly stable in size when similar measurement performed on exam earlier today. Subdural collections appear slightly more dense as compared with prior, with similar appearance on the post contrasted exam performed earlier today. No midline shift. Mild mass effect on underlying brain parenchyma. No definite new hyperdense blood is seen. The ventricles are stable in size. Vascular: No hyperdense vessels. Scattered carotid vascular calcification. Skull: Normal. Negative for fracture or focal lesion. Sinuses/Orbits: No acute finding. Mucosal disease in the left maxillary sinus. Other: None IMPRESSION: 1. Intermediate density left greater than right bilateral subdural hematomas, grossly stable in size compared with exam earlier today. Subdural collections appears slightly more dense as compared with prior CT probably related to contrast media administration. No midline shift. Electronically Signed   By: Donavan Foil M.D.   On: 05/09/2020 19:04   MR BRAIN WO  CONTRAST  Result Date: 05/10/2020 CLINICAL DATA:  Stroke follow-up EXAM: MRI HEAD WITHOUT CONTRAST TECHNIQUE: Multiplanar, multiecho pulse sequences of the brain and surrounding structures were obtained without intravenous contrast. COMPARISON:  CT head 05/09/2020.  MRI head 03/03/2018 FINDINGS: Brain: Negative for acute infarct. Few small white matter hyperintensities bilaterally. Negative for mass lesion Small bilateral extra-axial fluid collections. These are intermediate density on CT and are hyperdense on FLAIR. This measures approximately 3 mm on the right and 6 mm on the left. Findings compatible with small chronic subdural hemorrhage. Vascular: Normal arterial flow voids Skull and upper cervical spine: No focal skeletal lesion. Sinuses/Orbits: Mucosal edema left maxillary sinus.  Normal orbit Other: None IMPRESSION: Negative for acute infarct Small bilateral extra-axial fluid collection compatible with chronic subdural hematoma. Electronically Signed   By: Franchot Gallo M.D.   On: 05/10/2020 13:08   Overnight EEG with video  Result Date: 05/10/2020 Lora Havens, MD     05/11/2020  9:34 AM Patient Name: Nicholas [name] MRN:  [id number] Epilepsy Attending: Lora Havens Referring Physician/Provider: Dr Su Monks Duration: 05/10/2020 0029 to 05/11/2020 0029 Patient history: 60 year old male with left occipital stroke with some hemorrhage and 2020, presented with acute onset confusion and worsening of baseline right visual loss.  EEG to evaluate for seizures. Level of alertness: Awake, asleep AEDs during EEG study: Keppra, gabapentin Technical aspects: This EEG study was done with scalp electrodes positioned according to the 10-20 International system of electrode placement. Electrical activity was acquired at a sampling rate of 500Hz  and reviewed with a high frequency filter of 70Hz  and a low frequency filter of 1Hz . EEG data were recorded continuously and digitally stored. Description: The  posterior dominant rhythm consists of 8 Hz activity of moderate voltage (25-35 uV) seen predominantly in posterior head regions, asymmetric (L<R) and reactive to eye opening and eye closing. Sleep was characterized by vertex waves, sleep spindles (12 to 14 Hz), maximal frontocentral region.  EEG showed continuous 3 to 5 Hz theta-delta slowing in left temporo-parietal region.  Hyperventilation and photic stimulation were not performed.   ABNORMALITY -Continuous slow, left temporo-parietal region IMPRESSION: This study is suggestive of cortical dysfunction arising from left temporo-parietal region, likely secondary to underlying structural abnormality. No seizures or epileptiform discharges were seen throughout the recording. Lora Havens   CT HEAD CODE STROKE WO CONTRAST  Result Date: 05/09/2020 CLINICAL DATA:  Code stroke. Acute neuro deficit. Confusion slurred speech. EXAM: CT HEAD WITHOUT CONTRAST TECHNIQUE: Contiguous axial images were obtained from the base of the skull through the vertex without intravenous contrast. COMPARISON:  CT head 03/03/2018 FINDINGS: Brain: Bilateral extra-axial fluid collections left greater than right. These have intermediate density, slightly above CSF in likely are areas of subacute subdural hematoma. No high-density hemorrhage. Mild mass-effect on the left cerebral hemisphere. Left-sided fluid collection measures 8 mm in thickness and right-sided fluid collection measures 5 mm in thickness. Ventricle size normal. No midline shift. Negative for acute infarct or mass. Vascular: Negative for hyperdense vessel Skull: Negative Sinuses/Orbits: Mucosal edema paranasal sinuses.  Negative orbit Other: None ASPECTS ([place] Stroke Program Early CT Score) - Ganglionic level infarction (caudate, lentiform nuclei, internal capsule, insula, M1-M3 cortex): 7 - Supraganglionic infarction (M4-M6 cortex): 3 Total score (0-10 with 10 being normal): 10 IMPRESSION: 1. Bilateral intermediate  density subdural collections left greater than right. 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 No high-density hemorrhage. Mild mass-effect on the left cerebral hemisphere without midline shift. 2. ASPECTS is 10 3. These results were called by telephone at the time of interpretation on 05/09/2020 at 2:03 pm to provider [facility] , who verbally acknowledged these results. Electronically Signed   By: Franchot Gallo M.D.   On: 05/09/2020 14:04   CT ANGIO HEAD CODE STROKE  Result Date: 05/09/2020 CLINICAL DATA:  Stroke/TIA, assess intracranial arteries. Patient confusion, slurred speech, vision loss. EXAM: CT ANGIOGRAPHY HEAD TECHNIQUE: Multidetector CT imaging of the head was performed using the standard protocol during bolus administration of intravenous contrast. Multiplanar CT image reconstructions and MIPs were obtained to evaluate the vascular anatomy. CONTRAST:  58mL OMNIPAQUE IOHEXOL 350 MG/ML SOLN COMPARISON:  Same day CT head.  MRA March 03, 2018. FINDINGS: Anterior circulation: Bilateral intracranial internal carotid arteries, middle cerebral arteries, and anterior cerebral arteries are patent without evidence of proximal hemodynamically significant stenosis. Limited evaluation of the distal vasculature due to venous contamination. Posterior circulation: Bilateral intradural vertebral arteries, basilar artery and posterior cerebral arteries are patent proximally. No evidence of proximal hemodynamically significant stenosis. Limited evaluation of the  distal vessels due to venous contamination. Venous sinuses: As permitted by contrast timing, patent. Small left transverse and sigmoid sinuses. Review of the MIP images confirms the above findings. IMPRESSION: No evidence of large vessel occlusion or proximal hemodynamically significant stenosis. Limited evaluation of the distal vasculature due to venous contamination. Electronically Signed   By: Margaretha Sheffield MD   On: 05/09/2020 14:41   CT ANGIO NECK CODE  STROKE  Result Date: 05/09/2020 CLINICAL DATA:  Acute neuro deficit. Slurred speech. Headache 3 weeks. EXAM: CT ANGIOGRAPHY HEAD AND NECK TECHNIQUE: Multidetector CT imaging of the head and neck was performed using the standard protocol during bolus administration of intravenous contrast. Multiplanar CT image reconstructions and MIPs were obtained to evaluate the vascular anatomy. Carotid stenosis measurements (when applicable) are obtained utilizing NASCET criteria, using the distal internal carotid diameter as the denominator. CONTRAST:  4mL OMNIPAQUE IOHEXOL 350 MG/ML SOLN COMPARISON:  CT head 05/09/2020 FINDINGS: CTA NECK FINDINGS Aortic arch: Standard branching. Imaged portion shows no evidence of aneurysm or dissection. No significant stenosis of the major arch vessel origins. Right carotid system: Atherosclerotic calcification right carotid bifurcation. Approximately 45% diameter stenosis proximal right internal carotid artery due to calcific plaque. Left carotid system: Mild atherosclerotic calcification left carotid bifurcation without significant stenosis. Vertebral arteries: Normal vertebral arteries bilaterally. Skeleton: Mild cervical spondylosis.  No acute skeletal abnormality. Other neck: Negative for mass or edema in the neck. Upper chest: Lung apices clear bilaterally. Review of the MIP images confirms the above findings CTA HEAD FINDINGS Anterior circulation: Mild atherosclerotic calcification in the cavernous carotid bilaterally without stenosis. Anterior and middle cerebral arteries widely patent without stenosis or large vessel occlusion. Posterior circulation: Both vertebral arteries patent to the basilar. PICA patent bilaterally. Basilar widely patent. Superior cerebellar and posterior cerebral arteries normal bilaterally. Venous sinuses: Normal venous enhancement. Anatomic variants: None Review of the MIP images confirms the above findings IMPRESSION: 1. Negative for intracranial large  vessel occlusion. 2. Atherosclerotic disease carotid bifurcation with less than 50% diameter stenosis bilaterally. Both vertebral arteries widely patent. 3. These results were called by telephone at the time of interpretation on 05/09/2020 at 2:35 pm to provider [facility] , who verbally acknowledged these results. Electronically Signed   By: Franchot Gallo M.D.   On: 05/09/2020 14:36     Labs: BNP (last 3 results) No results for input(s): BNP in the last 8760 hours. Basic Metabolic Panel: Recent Labs  Lab 05/09/20 1347 05/11/20 0224 05/12/20 0216  NA 136 138 141  K 4.0 3.7 3.7  CL 102 108 109  CO2 24 27 22   GLUCOSE 194* 113* 139*  BUN 10 12 13   CREATININE 0.71 0.83 0.82  CALCIUM 9.0 8.8* 8.6*   Liver Function Tests: Recent Labs  Lab 05/09/20 1347  AST 20  ALT 21  ALKPHOS 60  BILITOT 0.7  PROT 7.2  ALBUMIN 4.1   CBC: Recent Labs  Lab 05/09/20 1347 05/11/20 0224 05/12/20 0216  WBC 10.5 11.7* 10.3  NEUTROABS 7.6  --   --   HGB 14.9 14.1 13.0  HCT 45.3 42.7 40.5  MCV 83.6 83.7 85.3  PLT 183 166 150   CBG: Recent Labs  Lab 05/11/20 1656 05/11/20 2104 05/11/20 2357 05/12/20 0424 05/12/20 0906  GLUCAP 169* 219* 151* 151* 127*   Lipid Profile Recent Labs    05/11/20 0224  CHOL 138  HDL 28*  LDLCALC 90  TRIG 98  CHOLHDL 4.9   Thyroid function studies No results for  input(s): TSH, T4TOTAL, T3FREE, THYROIDAB in the last 72 hours.  Invalid input(s): FREET3 Anemia work up No results for input(s): VITAMINB12, FOLATE, FERRITIN, TIBC, IRON, RETICCTPCT in the last 72 hours. Urinalysis    Component Value Date/Time   COLORURINE YELLOW 05/09/2020 1501   APPEARANCEUR CLEAR 05/09/2020 1501   LABSPEC 1.031 (H) 05/09/2020 1501   PHURINE 6.0 05/09/2020 1501   GLUCOSEU 150 (A) 05/09/2020 1501   HGBUR NEGATIVE 05/09/2020 1501   BILIRUBINUR NEGATIVE 05/09/2020 1501   KETONESUR NEGATIVE 05/09/2020 1501   PROTEINUR NEGATIVE 05/09/2020 1501   NITRITE  NEGATIVE 05/09/2020 1501   LEUKOCYTESUR NEGATIVE 05/09/2020 1501   Sepsis Labs Invalid input(s): PROCALCITONIN,  WBC,  LACTICIDVEN Microbiology Recent Results (from the past 240 hour(s))  Resp Panel by RT-PCR (Flu A&B, Covid) Nasopharyngeal Swab     Status: None   Collection Time: 05/09/20  3:00 PM   Specimen: Nasopharyngeal Swab; Nasopharyngeal(NP) swabs in vial transport medium  Result Value Ref Range Status   SARS Coronavirus 2 by RT PCR NEGATIVE NEGATIVE Final    Comment: (NOTE) SARS-CoV-2 target nucleic acids are NOT DETECTED.  The SARS-CoV-2 RNA is generally detectable in upper respiratory specimens during the acute phase of infection. The lowest concentration of SARS-CoV-2 viral copies this assay can detect is 138 copies/mL. A negative result does not preclude SARS-Cov-2 infection and should not be used as the sole basis for treatment or other patient management decisions. A negative result may occur with  improper specimen collection/handling, submission of specimen other than nasopharyngeal swab, presence of viral mutation(s) within the areas targeted by this assay, and inadequate number of viral copies(<138 copies/mL). A negative result must be combined with clinical observations, patient history, and epidemiological information. The expected result is Negative.  Fact Sheet for Patients:  EntrepreneurPulse.com.au  Fact Sheet for Healthcare Providers:  IncredibleEmployment.be  This test is no t yet approved or cleared by the [place] FDA and  has been authorized for detection and/or diagnosis of SARS-CoV-2 by FDA under an Emergency Use Authorization (EUA). This EUA will remain  in effect (meaning this test can be used) for the duration of the COVID-19 declaration under Section 564(b)(1) of the Act, 21 U.S.C.section 360bbb-3(b)(1), unless the authorization is terminated  or revoked sooner.       Influenza A by PCR NEGATIVE  NEGATIVE Final   Influenza B by PCR NEGATIVE NEGATIVE Final    Comment: (NOTE) The Xpert Xpress SARS-CoV-2/FLU/RSV plus assay is intended as an aid in the diagnosis of influenza from Nasopharyngeal swab specimens and should not be used as a sole basis for treatment. Nasal washings and aspirates are unacceptable for Xpert Xpress SARS-CoV-2/FLU/RSV testing.  Fact Sheet for Patients: EntrepreneurPulse.com.au  Fact Sheet for Healthcare Providers: IncredibleEmployment.be  This test is not yet approved or cleared by the [place] FDA and has been authorized for detection and/or diagnosis of SARS-CoV-2 by FDA under an Emergency Use Authorization (EUA). This EUA will remain in effect (meaning this test can be used) for the duration of the COVID-19 declaration under Section 564(b)(1) of the Act, 21 U.S.C. section 360bbb-3(b)(1), unless the authorization is terminated or revoked.  Performed at [facility], [address] [place], [place] [address]      Time coordinating discharge: 60 minutes  SIGNED: George Hugh, MD  [facility] 05/13/2020, 5:53 PM  If 7PM-7AM, please contact night-coverage [email or web address]

## 2020-05-14 ENCOUNTER — Telehealth: Payer: Self-pay | Admitting: Cardiology

## 2020-05-14 NOTE — Telephone Encounter (Signed)
Spoke with Pt and wife who state that pt was seen at [facility] for a seizure and blood on the brain.Pt's neurologist is requesting that pt stop Plavix and decrease ASA to 81 mg daily. They were told to ask cardiology before doing so. Please advice.

## 2020-05-14 NOTE — Telephone Encounter (Signed)
Pt notified and voiced understanding 

## 2020-05-14 NOTE — Telephone Encounter (Signed)
Dr. Kirkpatrick(Neurologist in [place]) seen patient for brain bleed. He wants patient to stop his Plavix and reduce Aspirin from 325mg  to 81mg . Appt has been made for 07/04/20 with [place] and I have also added him to waitlist.

## 2020-05-14 NOTE — Telephone Encounter (Signed)
I reviewed the chart and recent discharge summary.  It looks like this was already discussed with Dr. Gardiner Rhyme on our cardiology team with plan to reduce antiplatelet regimen to aspirin 81 mg daily and stop Plavix.  Given the current situation I would agree.

## 2020-05-21 ENCOUNTER — Observation Stay (HOSPITAL_COMMUNITY)
Admission: EM | Admit: 2020-05-21 | Discharge: 2020-05-23 | Disposition: A | Discharge status: Home / Self Care | Payer: BC Managed Care – PPO | Attending: Internal Medicine | Admitting: Internal Medicine

## 2020-05-21 ENCOUNTER — Emergency Department (HOSPITAL_COMMUNITY): Payer: BC Managed Care – PPO

## 2020-05-21 ENCOUNTER — Other Ambulatory Visit: Payer: Self-pay

## 2020-05-21 DIAGNOSIS — R0602 Shortness of breath: Secondary | ICD-10-CM | POA: Insufficient documentation

## 2020-05-21 DIAGNOSIS — Z794 Long term (current) use of insulin: Secondary | ICD-10-CM | POA: Diagnosis not present

## 2020-05-21 DIAGNOSIS — G9341 Metabolic encephalopathy: Principal | ICD-10-CM | POA: Insufficient documentation

## 2020-05-21 DIAGNOSIS — E1165 Type 2 diabetes mellitus with hyperglycemia: Secondary | ICD-10-CM | POA: Diagnosis present

## 2020-05-21 DIAGNOSIS — Z79899 Other long term (current) drug therapy: Secondary | ICD-10-CM | POA: Insufficient documentation

## 2020-05-21 DIAGNOSIS — I2510 Atherosclerotic heart disease of native coronary artery without angina pectoris: Secondary | ICD-10-CM | POA: Insufficient documentation

## 2020-05-21 DIAGNOSIS — S065XAA Traumatic subdural hemorrhage with loss of consciousness status unknown, initial encounter: Secondary | ICD-10-CM | POA: Diagnosis present

## 2020-05-21 DIAGNOSIS — G9340 Encephalopathy, unspecified: Secondary | ICD-10-CM

## 2020-05-21 DIAGNOSIS — Z8673 Personal history of transient ischemic attack (TIA), and cerebral infarction without residual deficits: Secondary | ICD-10-CM

## 2020-05-21 DIAGNOSIS — R519 Headache, unspecified: Secondary | ICD-10-CM | POA: Diagnosis present

## 2020-05-21 DIAGNOSIS — I10 Essential (primary) hypertension: Secondary | ICD-10-CM | POA: Insufficient documentation

## 2020-05-21 DIAGNOSIS — S065X9A Traumatic subdural hemorrhage with loss of consciousness of unspecified duration, initial encounter: Secondary | ICD-10-CM

## 2020-05-21 DIAGNOSIS — E1140 Type 2 diabetes mellitus with diabetic neuropathy, unspecified: Secondary | ICD-10-CM | POA: Insufficient documentation

## 2020-05-21 DIAGNOSIS — K219 Gastro-esophageal reflux disease without esophagitis: Secondary | ICD-10-CM | POA: Diagnosis present

## 2020-05-21 DIAGNOSIS — F17210 Nicotine dependence, cigarettes, uncomplicated: Secondary | ICD-10-CM | POA: Insufficient documentation

## 2020-05-21 DIAGNOSIS — Z7982 Long term (current) use of aspirin: Secondary | ICD-10-CM | POA: Insufficient documentation

## 2020-05-21 DIAGNOSIS — I6203 Nontraumatic chronic subdural hemorrhage: Secondary | ICD-10-CM

## 2020-05-21 DIAGNOSIS — U071 COVID-19: Secondary | ICD-10-CM | POA: Insufficient documentation

## 2020-05-21 DIAGNOSIS — M549 Dorsalgia, unspecified: Secondary | ICD-10-CM | POA: Diagnosis present

## 2020-05-21 LAB — CBC WITH DIFFERENTIAL/PLATELET
Abs Immature Granulocytes: 0.04 10*3/uL (ref 0.00–0.07)
Basophils Absolute: 0.1 10*3/uL (ref 0.0–0.1)
Basophils Relative: 1 %
Eosinophils Absolute: 0.1 10*3/uL (ref 0.0–0.5)
Eosinophils Relative: 1 %
HCT: 43.2 % (ref 39.0–52.0)
Hemoglobin: 14.1 g/dL (ref 13.0–17.0)
Immature Granulocytes: 0 %
Lymphocytes Relative: 27 %
Lymphs Abs: 3.2 10*3/uL (ref 0.7–4.0)
MCH: 27 pg (ref 26.0–34.0)
MCHC: 32.6 g/dL (ref 30.0–36.0)
MCV: 82.8 fL (ref 80.0–100.0)
Monocytes Absolute: 1.3 10*3/uL — ABNORMAL HIGH (ref 0.1–1.0)
Monocytes Relative: 11 %
Neutro Abs: 7.3 10*3/uL (ref 1.7–7.7)
Neutrophils Relative %: 60 %
Platelets: 205 10*3/uL (ref 150–400)
RBC: 5.22 MIL/uL (ref 4.22–5.81)
RDW: 13.2 % (ref 11.5–15.5)
WBC: 11.9 10*3/uL — ABNORMAL HIGH (ref 4.0–10.5)
nRBC: 0 % (ref 0.0–0.2)

## 2020-05-21 LAB — URINALYSIS, ROUTINE W REFLEX MICROSCOPIC
Bilirubin Urine: NEGATIVE
Glucose, UA: NEGATIVE mg/dL
Ketones, ur: 20 mg/dL — AB
Leukocytes,Ua: NEGATIVE
Nitrite: NEGATIVE
Protein, ur: 100 mg/dL — AB
Specific Gravity, Urine: 1.019 (ref 1.005–1.030)
pH: 5 (ref 5.0–8.0)

## 2020-05-21 LAB — COMPREHENSIVE METABOLIC PANEL
ALT: 16 U/L (ref 0–44)
AST: 16 U/L (ref 15–41)
Albumin: 3.6 g/dL (ref 3.5–5.0)
Alkaline Phosphatase: 65 U/L (ref 38–126)
Anion gap: 12 (ref 5–15)
BUN: 13 mg/dL (ref 8–23)
CO2: 26 mmol/L (ref 22–32)
Calcium: 8.9 mg/dL (ref 8.9–10.3)
Chloride: 95 mmol/L — ABNORMAL LOW (ref 98–111)
Creatinine, Ser: 0.95 mg/dL (ref 0.61–1.24)
GFR, Estimated: >60 mL/min (ref >60)
Glucose, Bld: 154 mg/dL — ABNORMAL HIGH (ref 70–99)
Potassium: 4.2 mmol/L (ref 3.5–5.1)
Sodium: 133 mmol/L — ABNORMAL LOW (ref 135–145)
Total Bilirubin: 0.8 mg/dL (ref 0.3–1.2)
Total Protein: 6.6 g/dL (ref 6.5–8.1)

## 2020-05-21 LAB — CBG MONITORING, ED
Glucose-Capillary: 158 mg/dL — ABNORMAL HIGH (ref 70–99)
Glucose-Capillary: 142 mg/dL — ABNORMAL HIGH (ref 70–99)

## 2020-05-21 LAB — AMMONIA: Ammonia: 30 umol/L (ref 9–35)

## 2020-05-21 MED ORDER — SODIUM CHLORIDE 0.9% FLUSH
3.0000 mL | Freq: Two times a day (BID) | INTRAVENOUS | Status: DC
  Administered 2020-05-21 – 2020-05-23 (×4): 3 mL via INTRAVENOUS

## 2020-05-21 MED ORDER — INSULIN ASPART 100 UNIT/ML ~~LOC~~ SOLN
0.0000 [IU] | Freq: Three times a day (TID) | SUBCUTANEOUS | Status: DC
  Administered 2020-05-22: 3 [IU] via SUBCUTANEOUS
  Administered 2020-05-23: 2 [IU] via SUBCUTANEOUS

## 2020-05-21 MED ORDER — POLYETHYLENE GLYCOL 3350 17 G PO PACK: 17.0000 g | PACK | Freq: Every day | ORAL | Status: DC | PRN

## 2020-05-21 MED ORDER — ONDANSETRON 4 MG PO TBDP: 8.0000 mg | ORAL_TABLET | Freq: Three times a day (TID) | ORAL | Status: DC | PRN

## 2020-05-21 MED ORDER — MAGNESIUM SULFATE 2 GM/50ML IV SOLN
2.0000 g | Freq: Once | INTRAVENOUS | Status: AC
  Administered 2020-05-21: 2 g via INTRAVENOUS
  Filled 2020-05-21: qty 50

## 2020-05-21 MED ORDER — INSULIN GLARGINE 100 UNIT/ML ~~LOC~~ SOLN
15.0000 [IU] | Freq: Every day | SUBCUTANEOUS | Status: DC
  Administered 2020-05-21 – 2020-05-22 (×2): 15 [IU] via SUBCUTANEOUS
  Filled 2020-05-21 (×3): qty 0.15

## 2020-05-21 MED ORDER — ASPIRIN EC 81 MG PO TBEC
81.0000 mg | DELAYED_RELEASE_TABLET | Freq: Every day | ORAL | Status: DC
  Administered 2020-05-22 – 2020-05-23 (×2): 81 mg via ORAL
  Filled 2020-05-21 (×2): qty 1

## 2020-05-21 MED ORDER — PANTOPRAZOLE SODIUM 40 MG PO TBEC
40.0000 mg | DELAYED_RELEASE_TABLET | Freq: Every day | ORAL | Status: DC
  Administered 2020-05-22 – 2020-05-23 (×2): 40 mg via ORAL
  Filled 2020-05-21 (×2): qty 1

## 2020-05-21 MED ORDER — LEVETIRACETAM 500 MG PO TABS
500.0000 mg | ORAL_TABLET | Freq: Two times a day (BID) | ORAL | Status: DC
  Administered 2020-05-21 – 2020-05-23 (×4): 500 mg via ORAL
  Filled 2020-05-21 (×4): qty 1

## 2020-05-21 MED ORDER — VALPROATE SODIUM 100 MG/ML IV SOLN
500.0000 mg | Freq: Once | INTRAVENOUS | Status: AC
  Administered 2020-05-21: 500 mg via INTRAVENOUS
  Filled 2020-05-21: qty 5

## 2020-05-21 MED ORDER — TRAMADOL HCL 50 MG PO TABS
50.0000 mg | ORAL_TABLET | Freq: Four times a day (QID) | ORAL | Status: DC | PRN
Start: 2020-05-21 — End: 2020-05-23

## 2020-05-21 MED ORDER — PROCHLORPERAZINE EDISYLATE 10 MG/2ML IJ SOLN
5.0000 mg | Freq: Once | INTRAMUSCULAR | Status: AC
  Administered 2020-05-21: 5 mg via INTRAVENOUS
  Filled 2020-05-21: qty 2

## 2020-05-21 MED ORDER — GABAPENTIN 100 MG PO CAPS
100.0000 mg | ORAL_CAPSULE | Freq: Every day | ORAL | Status: DC
  Administered 2020-05-22 – 2020-05-23 (×2): 100 mg via ORAL
  Filled 2020-05-21 (×2): qty 1

## 2020-05-21 MED ORDER — ACETAMINOPHEN 325 MG PO TABS
650.0000 mg | ORAL_TABLET | Freq: Four times a day (QID) | ORAL | Status: DC | PRN
  Administered 2020-05-22 (×2): 650 mg via ORAL
  Filled 2020-05-21 (×2): qty 2

## 2020-05-21 MED ORDER — ACETAMINOPHEN 650 MG RE SUPP: 650.0000 mg | Freq: Four times a day (QID) | RECTAL | Status: DC | PRN

## 2020-05-21 MED ORDER — SODIUM CHLORIDE 0.9 % IV BOLUS
1000.0000 mL | Freq: Once | INTRAVENOUS | Status: AC
  Administered 2020-05-21: 1000 mL via INTRAVENOUS

## 2020-05-21 MED ORDER — NITROGLYCERIN 0.4 MG SL SUBL
0.4000 mg | SUBLINGUAL_TABLET | SUBLINGUAL | Status: DC | PRN
  Administered 2020-05-23: 0.4 mg via SUBLINGUAL
  Filled 2020-05-21: qty 1

## 2020-05-21 MED ORDER — LEVETIRACETAM IN NACL 1000 MG/100ML IV SOLN
1000.0000 mg | Freq: Once | INTRAVENOUS | Status: AC
  Administered 2020-05-21: 1000 mg via INTRAVENOUS
  Filled 2020-05-21: qty 100

## 2020-05-21 MED ORDER — ALBUTEROL SULFATE HFA 108 (90 BASE) MCG/ACT IN AERS
1.0000 | INHALATION_SPRAY | Freq: Four times a day (QID) | RESPIRATORY_TRACT | Status: DC | PRN
  Filled 2020-05-21: qty 6.7

## 2020-05-21 NOTE — H&P (Signed)
History and Physical   Nicholas [name] XNT:[id number] DOB: 01/12/1961 DOA: 05/21/2020  PCP: Sharilyn Sites, MD   Patient coming from: Home  Chief Complaint: Headaches, Confusion.  HPI: Nicholas [name] is a 60 y.o. male with medical history significant of CAD status post DES, CVA, diabetes, hypertension, GERD hyperlipidemia, subdural hematoma, carpal tunnel who presents with worsening headache.  Patient presents with worsening headache in setting of recent admission for subdural hematoma.  Patient also stated that he felt like he might have another seizure per chart review.  Later in his course in the ED he was noted to be generally confused.   Per chart review patient was admitted 4/13-4/17 due to right vision loss secondary to head trauma.  Found to have 3 mm right and 6 mm left small subdural hematomas.  Neuro was consulted initially started on Keppra and gabapentin for possible seizures by the time he was discharged he was taken off Keppra but continued on gabapentin.  Neurosurgery was consulted and stated no need for surgery.  His symptoms resolved after few days and he was discharged at his baseline.  Reportedly his Keppra was restarted today by his PCP and he had only taken 1 dose of it.  He and family reports that he had a headache starting earlier today went to his PCP started to get a little bit more disoriented there and came into the ED due to continued headache and disorientation.  He is very lethargic on exam but also reportedly did not sleep at all last night.  Denies fevers, chills, chest pain, shortness of breath, abdominal pain, constipation, diarrhea, nausea, vomiting.  ED Course: Vital signs in the ED significant for heart rate in the 100s initially but improved to the 80s to 90s.  Vital signs otherwise stable.  Lab work-up showed sodium 133, chloride 95, glucose 154.  CBC with mild leukocytosis to 11.9.  Respiratory panel for flu and COVID pending.  Ammonia normal.  Urinalysis with  ketones, protein and rare bacteria.  Chest x-ray with no acute abnormality and emphysematous changes.  CT head with no acute abnormality and minimal decrease in the size of hematomas.  Patient received dose of Compazine in the ED and neurology is being consulted by EDP, but have not yet seen patient.  Review of Systems: As per HPI otherwise all other systems reviewed and are negative.  Past Medical History:  Diagnosis Date  . Anal lesion    Anal papilla/tags - external lesion  . Bilateral carpal tunnel syndrome 11/24/2018  . Colon polyp    Hyperplastic rectosigmoid polyp 9/08  . Coronary atherosclerosis of native coronary artery    DES to PLB, BMS to mid and distal RCA, DES to circumflex and distal LAD, 6/10 (5 stents)  . Diabetic peripheral neuropathy ([place]) 11/24/2018  . GERD (gastroesophageal reflux disease)   . Helicobacter pylori (H. pylori) 09/2009   Treated with helidac  . Hiatal hernia   . Hyperplastic colon polyp 2008  . Mixed hyperlipidemia   . Myocardial infarction ([place]) 06/2008  . Stroke [facility])    January 2018  . Type 2 diabetes mellitus ([place])     Past Surgical History:  Procedure Laterality Date  . CHOLECYSTECTOMY    . COLLAPSED LUNG     15 years ago  . COLONOSCOPY  10/12/2006   Dr. Gala Romney- rectosigmoid polyp s/phot snare polypectomy o/w normal rectum and terminal ileum. hyperplastic polyp on bx  . COLONOSCOPY WITH PROPOFOL N/A 09/01/2019   Procedure: COLONOSCOPY WITH PROPOFOL;  Surgeon: Daneil Dolin, MD;  Location[facility];  Service: Endoscopy;  Laterality: N/A;  8:30am  . EP IMPLANTABLE DEVICE N/A 02/12/2016   Procedure: Loop Recorder Insertion;  Surgeon: Thompson Grayer, MD;  Location: [facility];  Service: Cardiovascular;  Laterality: N/A;  . ESOPHAGOGASTRODUODENOSCOPY  10/12/2006   Dr. Gala Romney- examination of the tubular esophagus revealed no mucosal abnormalities. the EG junction was easily traversed. small hiatal hernia, the gastric mucosa o/w appeared  normal. there was no infiltrating process or frank ulcer seen.  . implantable loop recorder removal  09/29/2019   MDT Reveal LINQ removed in office by Dr Rayann Heman  . TEE WITHOUT CARDIOVERSION N/A 02/12/2016   Procedure: TRANSESOPHAGEAL ECHOCARDIOGRAM (TEE);  Surgeon: Jerline Pain, MD;  Location: [facility] ENDOSCOPY;  Service: Cardiovascular;  Laterality: N/A;  . WIRE IN APEX OF RIGHT LUNG     Since childhood    Social History  reports that he has been smoking cigarettes. He started smoking about 47 years ago. He has a 17.00 pack-year smoking history. He has never used smokeless tobacco. He reports that he does not drink alcohol and does not use drugs.  No Known Allergies  Family History  Problem Relation Age of Onset  . Diabetes Mother   . Heart disease Father   . Cancer Brother   . CVA Neg Hx   Reviewed on admission  Prior to Admission medications   Medication Sig Start Date End Date Taking? Authorizing Provider  acetaminophen (TYLENOL) 500 MG tablet Take 500 mg by mouth every 6 (six) hours as needed (for headaches).   Yes [provider]  albuterol (VENTOLIN HFA) 108 (90 Base) MCG/ACT inhaler Inhale 1-2 puffs into the lungs every 6 (six) hours as needed for wheezing or shortness of breath.   Yes [provider]  aspirin EC 81 MG EC tablet Take 1 tablet (81 mg total) by mouth daily. Swallow whole. 05/12/20  Yes Masoud, Jarrett Soho, MD  Continuous Blood Gluc Sensor (FREESTYLE LIBRE 2 SENSOR) MISC 1 Piece by Does not apply route every 14 (fourteen) days. Patient taking differently: Inject 1 Piece into the skin every 14 (fourteen) days. 05/08/20  Yes Nida, Marella Chimes, MD  gabapentin (NEURONTIN) 100 MG capsule Take 100 mg by mouth daily.  01/29/19  Yes [provider]  insulin glargine (LANTUS SOLOSTAR) 100 UNIT/ML Solostar Pen Inject 20 Units into the skin at bedtime. Patient taking differently: Inject 40 Units into the skin at bedtime. 05/12/20  Yes Masoud, Jarrett Soho, MD   levETIRAcetam (KEPPRA) 500 MG tablet Take 500 mg by mouth 2 (two) times daily.   Yes [provider]  nitroGLYCERIN (NITROSTAT) 0.4 MG SL tablet DISSOLVE 1 TABLET UNDER TONGUE EVERY 5 MINUTES UP TO 15 MIN FOR CHESTPAIN. IF NO RELIEF CALL 911. Patient taking differently: No sig reported 05/17/19  Yes Satira Sark, MD  ondansetron (ZOFRAN-ODT) 8 MG disintegrating tablet Take 8 mg by mouth every 8 (eight) hours as needed for nausea or vomiting (dissolve orally).   Yes [provider]  pantoprazole (PROTONIX) 40 MG tablet Take 40 mg by mouth daily before breakfast. 04/11/10  Yes [provider]  traMADol (ULTRAM) 50 MG tablet Take 1 tablet (50 mg total) by mouth every 6 (six) hours as needed for moderate pain. Patient taking differently: Take 50 mg by mouth daily. 02/12/16  Yes Vann, Jessica U, DO  BD PEN NEEDLE NANO U/F 32G X 4 MM MISC USE AS DIRECTED FOUR TIMES DAILY. 08/31/17  Cassandria Anger, MD  Continuous Blood Gluc Receiver (FREESTYLE LIBRE 2 READER) DEVI As directed 12/16/19   Cassandria Anger, MD    Physical Exam: Vitals:   05/21/20 1500 05/21/20 1600 05/21/20 1700 05/21/20 1800  BP: 131/82 135/83 110/64 121/65  Pulse: 93 93 89 89  Resp: (!) 21 13 15 11   Temp:      TempSrc:      SpO2: 98% 97% 96% 97%  Weight:      Height:       Physical Exam Constitutional:      General: He is not in acute distress.    Comments: Very tired appearing, falls asleep fast  HENT:     Head: Normocephalic and atraumatic.     Mouth/Throat:     Mouth: Mucous membranes are moist.     Pharynx: Oropharynx is clear.  Eyes:     Extraocular Movements: Extraocular movements intact.     Pupils: Pupils are equal, round, and reactive to light.  Cardiovascular:     Rate and Rhythm: Normal rate and regular rhythm.     Pulses: Normal pulses.     Heart sounds: Normal heart sounds.  Pulmonary:     Effort: Pulmonary effort is normal. No respiratory distress.     Breath  sounds: Normal breath sounds.  Abdominal:     General: Bowel sounds are normal. There is no distension.     Palpations: Abdomen is soft.     Tenderness: There is no abdominal tenderness.  Musculoskeletal:        General: No swelling or deformity.  Skin:    General: Skin is warm and dry.  Neurological:     Mental Status: Mental status is at baseline.     Comments: Mental Status: Patient is awake, alert, oriented x3 No signs of aphasia or neglect Cranial Nerves: - Left visual field deficit, reportedly chronic. II: Pupils equal, round, and reactive to light. III,IV, VI: EOMI without ptosis or diploplia.  V: Facial sensation is symmetric tolight touch andTemperature. VII: Facial movement is symmetric.  VIII: hearing is intact to voice X: Uvula elevates symmetrically XI: Shoulder shrug is symmetric. XII: tongue is midline without atrophy or fasciculations.  Motor: Good effort thorughout, at Least 5/5 bilateral UE, 5/5 bilateral lower extremitiy Sensory: Sensation is grossly intact  bilateral UEs & LEs    Labs on Admission: I have personally reviewed following labs and imaging studies  CBC: Recent Labs  Lab 05/21/20 1605  WBC 11.9*  NEUTROABS 7.3  HGB 14.1  HCT 43.2  MCV 82.8  PLT 914    Basic Metabolic Panel: Recent Labs  Lab 05/21/20 1605  NA 133*  K 4.2  CL 95*  CO2 26  GLUCOSE 154*  BUN 13  CREATININE 0.95  CALCIUM 8.9    GFR: Estimated Creatinine Clearance: 81.7 mL/min (by C-G formula based on SCr of 0.95 mg/dL).  Liver Function Tests: Recent Labs  Lab 05/21/20 1605  AST 16  ALT 16  ALKPHOS 65  BILITOT 0.8  PROT 6.6  ALBUMIN 3.6    Urine analysis:    Component Value Date/Time   COLORURINE AMBER (A) 05/21/2020 1605   APPEARANCEUR HAZY (A) 05/21/2020 1605   LABSPEC 1.019 05/21/2020 1605   PHURINE 5.0 05/21/2020 1605   GLUCOSEU NEGATIVE 05/21/2020 1605   HGBUR SMALL (A) 05/21/2020 1605   BILIRUBINUR NEGATIVE 05/21/2020 1605   KETONESUR  20 (A) 05/21/2020 1605   PROTEINUR 100 (A) 05/21/2020 1605   NITRITE NEGATIVE 05/21/2020  [place] 05/21/2020 1605    Radiological Exams on Admission: CT Head Wo Contrast  Result Date: 05/21/2020 CLINICAL DATA:  60 year old male with chronic subdural hematoma. EXAM: CT HEAD WITHOUT CONTRAST TECHNIQUE: Contiguous axial images were obtained from the base of the skull through the vertex without intravenous contrast. COMPARISON:  Head CT dated 05/09/2020 and MRI dated 05/10/2020. FINDINGS: Brain: Small bilateral chronic subdural collections similar or minimally decreased in size since the CT of 05/09/2020. No acute intracranial hemorrhage. There is no mass effect or midline shift. The ventricles and sulci appropriate size for patient's age. The gray-white matter discrimination is preserved. Vascular: No hyperdense vessel or unexpected calcification. Skull: Normal. Negative for fracture or focal lesion. Sinuses/Orbits: There is partial opacification of the left maxillary sinus with air-fluid level. The remainder of the visualized paranasal sinuses and mastoid air cells are clear. Other: None IMPRESSION: 1. No acute intracranial pathology. 2. Small bilateral chronic subdural collections similar or minimally decreased in size since the CT of 05/09/2020. 3. Left maxillary sinus disease. Electronically Signed   By: Anner Crete M.D.   On: 05/21/2020 15:37   DG Chest Portable 1 View  Result Date: 05/21/2020 CLINICAL DATA:  60 year old male with altered mental status. EXAM: PORTABLE CHEST 1 VIEW COMPARISON:  Chest radiograph dated 05/24/2009 FINDINGS: No focal consolidation, pleural effusion, or pneumothorax. Background of mild emphysema. Surgical sutures noted in the right apex. Top-normal cardiac size. Atherosclerotic calcification of the aorta. No acute osseous pathology. IMPRESSION: 1. No acute cardiopulmonary process. 2. Emphysema. Electronically Signed   By: Anner Crete M.D.   On:  05/21/2020 16:36   EKG: Independently reviewed.  Sinus tachycardia, 102 bpm.  Incomplete right bundle branch block.  Bundle branch block similar to previous.  Heart rate similar to previous.  Assessment/Plan Principal Problem:   Acute metabolic encephalopathy Active Problems:   GERD   Backache   History of stroke   Uncontrolled type 2 diabetes mellitus with hyperglycemia (HCC)   Subdural hematoma (HCC)  Acute encephalopathy > Patient initially presented with headache and per chart review had said he felt like he might have another seizure.  He reportedly had a seizure 2 during his previous admission for right vision loss due to subdural hematomas. > He has been noted to be acutely encephalopathic and confused and off of his baseline on reevaluation in the ED. > No abnormality in electrolytes, checking magnesium.  We will also check TSH. > Does have mild leukocytosis to 11.9 unclear etiology is urinalysis not suspicious for UTI and chest x-ray without acute abnormality.  Afebrile. > Possibility of subclinical seizures given his history.  Neurology has been consulted by EDP.  On exam he is very lethargic but is completely alert and oriented, unclear if some of this is just due to not sleeping the night before. - Appreciate neurology recommendations - Monitor on telemetry - Continue with Keppra and gabapentin for now - Follow-up magnesium, TSH - Trend WBCs and fever curve - PT/OT - EEG  CAD History of stroke with hemorrhage > Status post DES > Plavix held due to subdural hemorrhages. - Continue home aspirin - Continue home as needed nitro  Diabetes > 20U qhs at home - 15U Lantus qhs - SSI   GERD - Continue home PPI  Chronic back pain - Continue home tramadol  Emphysematous changes on chest x-ray - Continue albuterol  DVT prophylaxis: SCDs for now Code Status:   Full  Family Communication:  Family updated at bedside  Disposition Plan:   Patient is  from:  Home  Anticipated DC to:  Home  Anticipated DC date:  1 to 3 days  Anticipated DC barriers: None  Consults called:  Neurology consulted by EDP  Admission status:  Observation, telemetry   Severity of Illness: The appropriate patient status for this patient is OBSERVATION. Observation status is judged to be reasonable and necessary in order to provide the required intensity of service to ensure the patient's safety. The patient's presenting symptoms, physical exam findings, and initial radiographic and laboratory data in the context of their medical condition is felt to place them at decreased risk for further clinical deterioration. Furthermore, it is anticipated that the patient will be medically stable for discharge from the hospital within 2 midnights of admission. The following factors support the patient status of observation.   " The patient's presenting symptoms include headache, confusion. " The physical exam findings include lethargy, somnolence. " The initial radiographic and laboratory data are significant for complex Appointment with chest x-ray presents with symptoms and significant.  Urinalysis negative for protein.Marcelyn Bruins MD [facility]  How to contact the [facility] Attending or Consulting provider [place] or covering provider during after hours [place], for this patient?   1. Check the care team in [facility] and look for a) attending/consulting [facility] provider listed and b) the [facility] team listed 2. Log into [email or web address] and use [place]'s universal password to access. If you do not have the password, please contact the hospital operator. 3. Locate the [facility] provider you are looking for under [facility] and page to a number that you can be directly reached. 4. If you still have difficulty reaching the provider, please page the [facility] (Director on Call) for the Hospitalists listed on amion for assistance.  05/21/2020, 7:01 PM

## 2020-05-21 NOTE — ED Provider Notes (Signed)
[name] Provider Note   CSN: [id number] Arrival date & time: 05/21/20  1333     History Chief Complaint  Patient presents with  . Seizures    N[name] is a 60 y.o. male.  HPI Patient presents with headaches.  Recent diagnosis was subdural hematoma.  Had seizure twice in the hospital but not had any since.  That was around a week ago.  States he has had worsening headaches.  Still in the front and the back of the head like he was having previously.  No numbness or weakness.  No further seizure although patient states he is worried he could have 1.  Reportedly had negative EEG while in the hospital.  They did not discharge him with any antiepileptics.  No numbness weakness.  No nausea or vomiting.    Past Medical History:  Diagnosis Date  . Anal lesion    Anal papilla/tags - external lesion  . Bilateral carpal tunnel syndrome 11/24/2018  . Colon polyp    Hyperplastic rectosigmoid polyp 9/08  . Coronary atherosclerosis of native coronary artery    DES to PLB, BMS to mid and distal RCA, DES to circumflex and distal LAD, 6/10 (5 stents)  . Diabetic peripheral neuropathy ([place]) 11/24/2018  . GERD (gastroesophageal reflux disease)   . Helicobacter pylori (H. pylori) 09/2009   Treated with helidac  . Hiatal hernia   . Hyperplastic colon polyp 2008  . Mixed hyperlipidemia   . Myocardial infarction ([place]) 06/2008  . Stroke [facility])    January 2018  . Type 2 diabetes mellitus [facility])     Patient Active Problem List   Diagnosis Date Noted  . Receptive aphasia   . Subdural hematoma ([place]) 05/09/2020  . Polypharmacy 06/17/2019  . Encounter for screening colonoscopy 06/17/2019  . Vitamin D deficiency 05/10/2019  . Diabetic peripheral neuropathy ([place]) 11/24/2018  . Bilateral carpal tunnel syndrome 11/24/2018  . Acute ischemic stroke ([place]) 03/03/2018  . Uncontrolled type 2 diabetes mellitus with hyperglycemia ([place]) 04/21/2017  . Hyperlipemia  04/21/2016  . Malnutrition of moderate degree 02/11/2016  . Acute CVA (cerebrovascular accident) ([place]) 02/11/2016  . TIA (transient ischemic attack) 02/09/2016  . Right carotid bruit 02/09/2016  . Personal history of noncompliance with medical treatment, presenting hazards to health 02/09/2016  . Type 2 diabetes mellitus with vascular disease ([place]) 12/12/2014  . Essential hypertension, benign 12/12/2014  . Hyperlipidemia 12/12/2014  . 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 TOBACCO ABUSE 07/26/2008  . CORONARY ATHEROSCLEROSIS NATIVE CORONARY ARTERY 07/26/2008  . GERD 07/25/2008  . BACK PAIN, CHRONIC 07/25/2008    Past Surgical History:  Procedure Laterality Date  . CHOLECYSTECTOMY    . COLLAPSED LUNG     15 years ago  . COLONOSCOPY  10/12/2006   Dr. Gala Romney- rectosigmoid polyp s/phot snare polypectomy o/w normal rectum and terminal ileum. hyperplastic polyp on bx  . COLONOSCOPY WITH PROPOFOL N/A 09/01/2019   Procedure: COLONOSCOPY WITH PROPOFOL;  Surgeon: Daneil Dolin, MD;  [facility];  Service: Endoscopy;  Laterality: N/A;  8:30am  . EP IMPLANTABLE DEVICE N/A 02/12/2016   Procedure: Loop Recorder Insertion;  Surgeon: Thompson Grayer, MD;  Location: [place] CV LAB;  Service: Cardiovascular;  Laterality: N/A;  . ESOPHAGOGASTRODUODENOSCOPY  10/12/2006   Dr. Gala Romney- examination of the tubular esophagus revealed no mucosal abnormalities. the EG junction was easily traversed. small hiatal hernia, the gastric mucosa o/w appeared normal. there was no infiltrating process or frank ulcer seen.  Marland Kitchen  implantable loop recorder removal  09/29/2019   MDT Reveal LINQ removed in office by Dr Rayann Heman  . TEE WITHOUT CARDIOVERSION N/A 02/12/2016   Procedure: TRANSESOPHAGEAL ECHOCARDIOGRAM (TEE);  Surgeon: Jerline Pain, MD;  Location: [facility] ENDOSCOPY;  Service: Cardiovascular;  Laterality: N/A;  . WIRE IN APEX OF RIGHT LUNG     Since childhood       Family History  Problem Relation Age of Onset   . Diabetes Mother   . Heart disease Father   . Cancer Brother   . CVA Neg Hx     Social History   Tobacco Use  . Smoking status: Current Every Day Smoker    Packs/day: 0.50    Years: 34.00    Pack years: 17.00    Types: Cigarettes    Start date: 04/08/1973  . Smokeless tobacco: Never Used  Vaping Use  . Vaping Use: Never used  Substance Use Topics  . Alcohol use: No    Alcohol/week: 0.0 standard drinks  . Drug use: No    Home Medications Prior to Admission medications   Medication Sig Start Date End Date Taking? Authorizing Provider  albuterol (VENTOLIN HFA) 108 (90 Base) MCG/ACT inhaler Inhale 1-2 puffs into the lungs every 6 (six) hours as needed for wheezing or shortness of breath.    [provider]  aspirin EC 81 MG EC tablet Take 1 tablet (81 mg total) by mouth daily. Swallow whole. 05/12/20   George Hugh, MD  BD PEN NEEDLE NANO U/F 32G X 4 MM MISC USE AS DIRECTED FOUR TIMES DAILY. 08/31/17   Cassandria Anger, MD  Continuous Blood Gluc Receiver (FREESTYLE LIBRE 2 READER) DEVI As directed 12/16/19   Cassandria Anger, MD  Continuous Blood Gluc Sensor (FREESTYLE LIBRE 2 SENSOR) MISC 1 Piece by Does not apply route every 14 (fourteen) days. 05/08/20   Cassandria Anger, MD  gabapentin (NEURONTIN) 100 MG capsule Take 100 mg by mouth daily.  01/29/19   [provider]  insulin glargine (LANTUS SOLOSTAR) 100 UNIT/ML Solostar Pen Inject 20 Units into the skin at bedtime. 05/12/20   George Hugh, MD  nitroGLYCERIN (NITROSTAT) 0.4 MG SL tablet DISSOLVE 1 TABLET UNDER TONGUE EVERY 5 MINUTES UP TO 15 MIN FOR CHESTPAIN. IF NO RELIEF CALL 911. Patient taking differently: Place 0.4 mg under the tongue every 5 (five) minutes as needed for chest pain. 05/17/19   Satira Sark, MD  pantoprazole (PROTONIX) 40 MG tablet Take 40 mg by mouth daily. 04/11/10   [provider]  traMADol (ULTRAM) 50 MG tablet Take 1 tablet (50 mg total) by mouth every 6  (six) hours as needed for moderate pain. Patient not taking: Reported on 05/10/2020 02/12/16   Geradine Girt, DO    Allergies    Patient has no known allergies.  Review of Systems   Review of Systems  Constitutional: Negative for appetite change.  HENT: Negative for congestion.   Respiratory: Negative for shortness of breath.   Gastrointestinal: Positive for nausea.  Genitourinary: Negative for flank pain.  Musculoskeletal: Negative for back pain.  Skin: Negative for rash.  Neurological: Positive for headaches.  Psychiatric/Behavioral: Negative for confusion.    Physical Exam Updated Vital Signs BP 131/82   Pulse 93   Temp 98.5 F (36.9 C) (Oral)   Resp (!) 21   Ht 5\' 9"  (1.753 m)   Wt 72.6 kg   SpO2 98%   BMI 23.63 kg/m   Physical Exam Vitals and  nursing note reviewed.  HENT:     Head: Atraumatic.     Mouth/Throat:     Mouth: Mucous membranes are moist.  Eyes:     Pupils: Pupils are equal, round, and reactive to light.  Cardiovascular:     Rate and Rhythm: Regular rhythm.  Pulmonary:     Breath sounds: Normal breath sounds.  Abdominal:     Tenderness: There is no abdominal tenderness.  Musculoskeletal:        General: No tenderness.     Cervical back: Neck supple.  Skin:    General: Skin is warm.     Capillary Refill: Capillary refill takes less than 2 seconds.  Neurological:     Mental Status: He is alert and oriented to person, place, and time.     ED Results / Procedures / Treatments   Labs (all labs ordered are listed, but only abnormal results are displayed) Labs Reviewed - No data to display  EKG EKG Interpretation  Date/Time:  Monday May 21 2020 13:58:12 EDT Ventricular Rate:  102 PR Interval:  148 QRS Duration: 104 QT Interval:  372 QTC Calculation: 484 R Axis:   -54 Text Interpretation: Sinus tachycardia Left axis deviation Incomplete right bundle branch block Lateral infarct , age undetermined Inferior infarct , age undetermined  Abnormal ECG Confirmed by Davonna Belling [phone]) on 05/21/2020 2:49:02 PM   Radiology CT Head Wo Contrast  Result Date: 05/21/2020 CLINICAL DATA:  60 year old male with chronic subdural hematoma. EXAM: CT HEAD WITHOUT CONTRAST TECHNIQUE: Contiguous axial images were obtained from the base of the skull through the vertex without intravenous contrast. COMPARISON:  Head CT dated 05/09/2020 and MRI dated 05/10/2020. FINDINGS: Brain: Small bilateral chronic subdural collections similar or minimally decreased in size since the CT of 05/09/2020. No acute intracranial hemorrhage. There is no mass effect or midline shift. The ventricles and sulci appropriate size for patient's age. The gray-white matter discrimination is preserved. Vascular: No hyperdense vessel or unexpected calcification. Skull: Normal. Negative for fracture or focal lesion. Sinuses/Orbits: There is partial opacification of the left maxillary sinus with air-fluid level. The remainder of the visualized paranasal sinuses and mastoid air cells are clear. Other: None IMPRESSION: 1. No acute intracranial pathology. 2. Small bilateral chronic subdural collections similar or minimally decreased in size since the CT of 05/09/2020. 3. Left maxillary sinus disease. Electronically Signed   By: Anner Crete M.D.   On: 05/21/2020 15:37    Procedures Procedures   Medications Ordered in ED Medications  prochlorperazine (COMPAZINE) injection 5 mg (5 mg Intravenous Given 05/21/20 1445)    ED Course  I have reviewed the triage vital signs and the nursing notes.  Pertinent labs & imaging results that were available during my care of the patient were reviewed by me and considered in my medical decision making (see chart for details).    MDM Rules/Calculators/A&P                          Patient with headache.  History of same with subdural.  No current seizure activity and not on antiepileptics.  Will repeat imaging.  Will treat for headache.   Care turned over to Dr. Reather Converse Final Clinical Impression(s) / ED Diagnoses Final diagnoses:  Nonintractable headache, unspecified chronicity pattern, unspecified headache type    Rx / DC Orders ED Discharge Orders    None       Davonna Belling, MD 05/21/20 1544

## 2020-05-21 NOTE — ED Notes (Signed)
Patient transported to CT 

## 2020-05-21 NOTE — Progress Notes (Signed)
Neurology gave the okay for EEG to be preformed in morning, 05/22/2020.  Spouse thought study was cancel, thus declined study today.

## 2020-05-21 NOTE — Consult Note (Signed)
NEUROLOGY CONSULTATION NOTE   Date of service: May 21, 2020 Patient Name: [name] MRN:  [id number] DOB:  07-Sep-1960 Reason for consult: "AMS, hx of stable SDH, concern for subclinical seizures" _ _ _   _ __   _ __ _ _  __ __   _ __   __ _  History of Present Illness  N[name] is a 60 y.o. male with PMH significant for hx of L occipital stroke with some hemorrhage in Feb 2020 with very little preipheral vision deficit on the right, hx of GERD, DM2, MI, HLD, peripheral neuropathy, recent subdural stable on [facility] today who presents with confusion and headaches.  Wife provides most of the hx as patient is somnolent. Reports that his presentation this time is different than in the past with him being more confused rather than having trouble with language. Saturday, he reported bifrontal headache, Sunday the headache was really bad. He could barely sleep. Vomited at midnight on Sunday. They went in to see their PCP on Monday morning. He threw up on Monday morning too. In the PCP office got some toradol and zofran IM for headaches and nausea. Seemed more confused and sleepy so was brougyht in to the ED. We were asked to evalute him for concern for subclinical seizure/  He was recently admitted for BL SDH when he presented with an episode of aphasia and confusion. Repeat imaging with stable CTH. LTM with continuous slowing in the left temporo-parietal region. No seizures or epileptiform discharges. Aphasia was prolonged and significantly improved in 2-3 days at that time. It seems that his Keppra was stoppe dat the time of discharge. Last neurology note does mention continuing Keppra.  ROS   Constitutional Denies weight loss, fever and chills.   HEENT Denies changes in vision and hearing.   Respiratory Denies SOB and cough.   CV Denies palpitations and CP   GI Denies abdominal pain, nausea, vomiting and diarrhea.   GU Denies dysuria and urinary frequency.   MSK Denies myalgia and joint  pain.   Skin Denies rash and pruritus.   Neurological + headache, no syncope.   Psychiatric Denies recent changes in mood. Denies anxiety and depression.    Past History   Past Medical History:  Diagnosis Date  . Anal lesion    Anal papilla/tags - external lesion  . Bilateral carpal tunnel syndrome 11/24/2018  . Colon polyp    Hyperplastic rectosigmoid polyp 9/08  . Coronary atherosclerosis of native coronary artery    DES to PLB, BMS to mid and distal RCA, DES to circumflex and distal LAD, 6/10 (5 stents)  . Diabetic peripheral neuropathy ([facility]) 11/24/2018  . GERD (gastroesophageal reflux disease)   . Helicobacter pylori (H. pylori) 09/2009   Treated with helidac  . Hiatal hernia   . Hyperplastic colon polyp 2008  . Mixed hyperlipidemia   . Myocardial infarction ([facility]) 06/2008  . Stroke ([facility])    January 2018  . Type 2 diabetes mellitus ([facility])    Past Surgical History:  Procedure Laterality Date  . CHOLECYSTECTOMY    . COLLAPSED LUNG     15  years ago  . COLONOSCOPY  10/12/2006   Dr. Gala Romney- rectosigmoid polyp s/phot snare polypectomy o/w normal rectum and terminal ileum. hyperplastic polyp on bx  . COLONOSCOPY WITH PROPOFOL N/A 09/01/2019   Procedure: COLONOSCOPY WITH PROPOFOL;  Surgeon: Daneil Dolin, MD;  [facility];  Service: Endoscopy;  Laterality: N/A;  8:30am  . EP  IMPLANTABLE DEVICE N/A 02/12/2016   Procedure: Loop Recorder Insertion;  Surgeon: Thompson Grayer, MD;  Location: [place] CV LAB;  Service: Cardiovascular;  Laterality: N/A;  . ESOPHAGOGASTRODUODENOSCOPY  10/12/2006   Dr. Gala Romney- examination of the tubular esophagus revealed no mucosal abnormalities. the EG junction was easily traversed. small hiatal hernia, the gastric mucosa o/w appeared normal. there was no infiltrating process or frank ulcer seen.  . implantable loop recorder removal  09/29/2019   MDT Reveal LINQ removed in office by Dr Rayann Heman  . TEE WITHOUT CARDIOVERSION N/A 02/12/2016    Procedure: TRANSESOPHAGEAL ECHOCARDIOGRAM (TEE);  Surgeon: Jerline Pain, MD;  Location: [facility] ENDOSCOPY;  Service: Cardiovascular;  Laterality: N/A;  . WIRE IN APEX OF RIGHT LUNG     Since childhood   Family History  Problem Relation Age of Onset  . Diabetes Mother   . Heart disease Father   . Cancer Brother   . CVA Neg Hx    Social History   Socioeconomic History  . Marital status: Married    Spouse name: Not on file  . Number of children: Not on file  . Years of education: Not on file  . Highest education level: Not on file  Occupational History  . Occupation: maintenance, parking lots    Employer: SELF-EMPLOYED  Tobacco Use  . Smoking status: Current Every Day Smoker    Packs/day: 0.50    Years: 34.00    Pack years: 17.00    Types: Cigarettes    Start date: 04/08/1973  . Smokeless tobacco: Never Used  Vaping Use  . Vaping Use: Never used  Substance and Sexual Activity  . Alcohol use: No    Alcohol/week: 0.0 standard drinks  . Drug use: No  . Sexual activity: Yes    Partners: Female    Birth control/protection: Pill  Other Topics Concern  . Not on file  Social History Narrative   Drinks Coffee 2 cups daily.  Lives sat home with wife.   Is self employed.  Graduated from High school.     Social Determinants of Health   Financial Resource Strain: Not on file  Food Insecurity: Not on file  Transportation Needs: Not on file  Physical Activity: Not on file  Stress: Not on file  Social Connections: Not on file   No Known Allergies  Medications  (Not in a hospital admission)    Vitals   Vitals:   05/21/20 1500 05/21/20 1600 05/21/20 1700 05/21/20 1800  BP: 131/82 135/83 110/64 121/65  Pulse: 93 93 89 89  Resp: (!) 21 13 15 11   Temp:      TempSrc:      SpO2: 98% 97% 96% 97%  Weight:      Height:         Body mass index is 23.63 kg/m.  Physical Exam   General: Laying comfortably in bed; in no acute distress.  HENT: Normal oropharynx and mucosa.  Normal external appearance of ears and nose.  Neck: Supple, no pain or tenderness  CV: No JVD. No peripheral edema.  Pulmonary: Symmetric Chest rise. Normal respiratory effort.  Abdomen: Soft to touch, non-tender.  Ext: No cyanosis, edema, or deformity  Skin: No rash. Normal palpation of skin.   Musculoskeletal: Normal digits and nails by inspection. No clubbing.   Neurologic Examination  Mental status/Cognition: Asleep, opens eyes to voice. oriented to self, place, month and year, poor attention. Able to do calculation and follow complex commands. Speech/language: Fluent, comprehension intact, object  naming intact, repetition intact.  Cranial nerves:   CN II Pupils equal and reactive to light, VF deficits    CN III,IV,VI EOM intact, no gaze preference or deviation, no nystagmus    CN V normal sensation in V1, V2, and V3 segments bilaterally    CN VII no asymmetry, no nasolabial fold flattening    CN VIII normal hearing to speech    CN IX & X normal palatal elevation, no uvular deviation    CN XI 5/5 head turn and 5/5 shoulder shrug bilaterally    CN XII midline tongue protrusion    Motor:  Muscle bulk: normal, tone normal, pronator drift none tremor none Mvmt Root Nerve  Muscle Right Left Comments  SA C5/6 Ax Deltoid 5 5   EF C5/6 Mc Biceps 5 5   EE C6/7/8 Rad Triceps 5 5   WF C6/7 Med FCR     WE C7/8 PIN ECU     F Ab C8/T1 U ADM/FDI 5 5   HF L1/2/3 Fem Illopsoas 4+ 4+   KE L2/3/4 Fem Quad     DF L4/5 D Peron Tib Ant 5 5   PF S1/2 Tibial Grc/Sol 5 5    Reflexes:  Right Left Comments  Pectoralis      Biceps (C5/6) 1 1   Brachioradialis (C5/6) 1 1    Triceps (C6/7) 1 1    Patellar (L3/4) 1 1    Achilles (S1)      Hoffman      Plantar     Jaw jerk    Sensation:  Light touch intact   Pin prick    Temperature    Vibration   Proprioception    Coordination/Complex Motor:  - Finger to Nose intact - Heel to shin intact  Labs   CBC:  Recent Labs  Lab  05/21/20 1605  WBC 11.9*  NEUTROABS 7.3  HGB 14.1  HCT 43.2  MCV 82.8  PLT 588    Basic Metabolic Panel:  Lab Results  Component Value Date   NA 133 (L) 05/21/2020   K 4.2 05/21/2020   CO2 26 05/21/2020   GLUCOSE 154 (H) 05/21/2020   BUN 13 05/21/2020   CREATININE 0.95 05/21/2020   CALCIUM 8.9 05/21/2020   GFRNONAA >60 05/21/2020   GFRAA 115 10/20/2018   Lipid Panel:  Lab Results  Component Value Date   LDLCALC 90 05/11/2020   HgbA1c:  Lab Results  Component Value Date   HGBA1C 8.0 (H) 05/09/2020   Urine Drug Screen:     Component Value Date/Time   LABOPIA NONE DETECTED 05/09/2020 1501   COCAINSCRNUR NONE DETECTED 05/09/2020 1501   LABBENZ NONE DETECTED 05/09/2020 1501   AMPHETMU NONE DETECTED 05/09/2020 1501   THCU NONE DETECTED 05/09/2020 1501   LABBARB NONE DETECTED 05/09/2020 1501    Alcohol Level     Component Value Date/Time   ETH <10 05/09/2020 1347    CT Head without contrast: 1. No acute intracranial pathology. 2. Small bilateral chronic subdural collections similar or minimally decreased in size since the CT of 05/09/2020. 3. Left maxillary sinus disease.  rEEG: pending.  Impression   Nicholas [name] is a 60 y.o. male with PMH significant for hx of L occipital stroke with some hemorrhage in Feb 2020 with very little preipheral vision deficit on the right, hx of GERD, DM2, MI, HLD, peripheral neuropathy, recent subdural stable on [facility] today who presents with confusion and headaches. He is somnolent, but awakens  to appropriately respond and follow commands and even able to do calculations. Exam is more concerning for somnolence rather than aphasia or confusion. No focal deficit on exam, vitals are stable, glucose normal, labs with mild hyponatremia and hypochloremia(likely due to vomiting and poor po intake). UA with no pyuria, CTH with stable SDH.  I do not suspect that he is actively seizing, suspect that this is more likely lethargy due to  vomiting, poor sleep in the setting of headache.  Recommendations  - Headache cocktail with Magnesium and Depakote 500mg  IV with 1039ml fluid bolus - Keppra 1000mg  IV once and continue 500mg  BID - routine EEG. - observe overnight. Can be discharged in AM if he is back to baseline. - Agree with close neurology follow up, he is scheduled to see outpatient neurology on Wednesday(4/27) ______________________________________________________________________   Thank you for the opportunity to take part in the care of this patient. If you have any further questions, please contact the neurology consultation attending.  Signed,  [place] Pager Number [phone] _ _ _   _ __   _ __ _ _  __ __   _ __   __ _

## 2020-05-21 NOTE — ED Triage Notes (Signed)
Pt reports worsening headache. Pt reports he was d/x w/subdural hematoma.Pt reports he feels like he might have another seizure.

## 2020-05-21 NOTE — ED Provider Notes (Signed)
Patient care signed out to follow-up CT head results and reassess. Family arrived which was helpful to provide further details.  Patient was recently admitted and gradually improved to baseline which is patient can walk without assistance, make his own meals, normal conversations.  Patient has history of multiple strokes.  Patient was taken off Plavix and had small subdurals, no witnessed seizures.  Patient presented with confusion and headaches gradually worsening throughout today.  On assessment patient has general confusion, will open eyes and follow commands briefly to loud verbal discussion.  Clinically patient acute encephalopathy with broad differential including metabolic, subclinical seizures, subdural related, other.  Blood work added, urinalysis pending.  CT scan of the head similar size subdurals, no acute bleeding.  No fever or recent infectious symptoms.  Discussed plan with caregiver and likely plan for admission.  Reviewed medical records, recent discharge summary.  Patient still encephalopathic and sleepy on reassessment.  No fever.  Labs Reviewed  COMPREHENSIVE METABOLIC PANEL - Abnormal; Notable for the following components:      Result Value   Sodium 133 (*)    Chloride 95 (*)    Glucose, Bld 154 (*)    All other components within normal limits  CBC WITH DIFFERENTIAL/PLATELET - Abnormal; Notable for the following components:   WBC 11.9 (*)    Monocytes Absolute 1.3 (*)    All other components within normal limits  URINALYSIS, ROUTINE W REFLEX MICROSCOPIC - Abnormal; Notable for the following components:   Color, Urine AMBER (*)    APPearance HAZY (*)    Hgb urine dipstick SMALL (*)    Ketones, ur 20 (*)    Protein, ur 100 (*)    Bacteria, UA RARE (*)    All other components within normal limits  CBG MONITORING, ED - Abnormal; Notable for the following components:   Glucose-Capillary 158 (*)    All other components within normal limits  AMMONIA   Blood work reviewed  showing normal ammonia, white blood cell count 11.9, sodium 133, normal glucose 154. Discussed concerns for patient not being at baseline, possible subclinical seizures or other cause for him to be confused which was similar to when he was admitted before.  Discussed with hospitalist for admission and further work-up.  Neurology consulted to discuss repeat EEG in the hospital.     Elnora Morrison, MD 05/21/20 1840

## 2020-05-22 ENCOUNTER — Observation Stay (HOSPITAL_COMMUNITY): Payer: BC Managed Care – PPO

## 2020-05-22 ENCOUNTER — Telehealth: Payer: Self-pay | Admitting: Neurology

## 2020-05-22 DIAGNOSIS — Z8673 Personal history of transient ischemic attack (TIA), and cerebral infarction without residual deficits: Secondary | ICD-10-CM | POA: Diagnosis not present

## 2020-05-22 DIAGNOSIS — S065X9A Traumatic subdural hemorrhage with loss of consciousness of unspecified duration, initial encounter: Secondary | ICD-10-CM | POA: Diagnosis not present

## 2020-05-22 DIAGNOSIS — G9341 Metabolic encephalopathy: Secondary | ICD-10-CM | POA: Diagnosis not present

## 2020-05-22 DIAGNOSIS — E1165 Type 2 diabetes mellitus with hyperglycemia: Secondary | ICD-10-CM | POA: Diagnosis not present

## 2020-05-22 LAB — GLUCOSE, CAPILLARY
Glucose-Capillary: 161 mg/dL — ABNORMAL HIGH (ref 70–99)
Glucose-Capillary: 158 mg/dL — ABNORMAL HIGH (ref 70–99)
Glucose-Capillary: 116 mg/dL — ABNORMAL HIGH (ref 70–99)
Glucose-Capillary: 106 mg/dL — ABNORMAL HIGH (ref 70–99)
Glucose-Capillary: 129 mg/dL — ABNORMAL HIGH (ref 70–99)

## 2020-05-22 LAB — CBC
HCT: 41.3 % (ref 39.0–52.0)
Hemoglobin: 13.7 g/dL (ref 13.0–17.0)
MCH: 27 pg (ref 26.0–34.0)
MCHC: 33.2 g/dL (ref 30.0–36.0)
MCV: 81.3 fL (ref 80.0–100.0)
Platelets: 212 10*3/uL (ref 150–400)
RBC: 5.08 MIL/uL (ref 4.22–5.81)
RDW: 13 % (ref 11.5–15.5)
WBC: 11 10*3/uL — ABNORMAL HIGH (ref 4.0–10.5)
nRBC: 0 % (ref 0.0–0.2)

## 2020-05-22 LAB — SARS CORONAVIRUS 2 (TAT 6-24 HRS): SARS Coronavirus 2: POSITIVE — AB

## 2020-05-22 LAB — D-DIMER, QUANTITATIVE: D-Dimer, Quant: 0.64 ug/mL-FEU — ABNORMAL HIGH (ref 0.00–0.50)

## 2020-05-22 LAB — C-REACTIVE PROTEIN: CRP: 2.7 mg/dL — ABNORMAL HIGH (ref <1.0)

## 2020-05-22 LAB — MAGNESIUM: Magnesium: 2 mg/dL (ref 1.7–2.4)

## 2020-05-22 LAB — BRAIN NATRIURETIC PEPTIDE: B Natriuretic Peptide: 189.1 pg/mL — ABNORMAL HIGH (ref 0.0–100.0)

## 2020-05-22 MED ORDER — GUAIFENESIN-DM 100-10 MG/5ML PO SYRP: 10.0000 mL | ORAL_SOLUTION | ORAL | Status: DC | PRN

## 2020-05-22 MED ORDER — ALBUTEROL SULFATE HFA 108 (90 BASE) MCG/ACT IN AERS
2.0000 | INHALATION_SPRAY | Freq: Four times a day (QID) | RESPIRATORY_TRACT | Status: DC
  Administered 2020-05-22 – 2020-05-23 (×4): 2 via RESPIRATORY_TRACT
  Filled 2020-05-22: qty 6.7

## 2020-05-22 MED ORDER — ZINC SULFATE 220 (50 ZN) MG PO CAPS
220.0000 mg | ORAL_CAPSULE | Freq: Every day | ORAL | Status: DC
  Administered 2020-05-22 – 2020-05-23 (×2): 220 mg via ORAL
  Filled 2020-05-22 (×2): qty 1

## 2020-05-22 MED ORDER — SODIUM CHLORIDE 0.9 % IV SOLN
100.0000 mg | Freq: Every day | INTRAVENOUS | Status: DC
  Filled 2020-05-22: qty 20

## 2020-05-22 MED ORDER — KETOROLAC TROMETHAMINE 30 MG/ML IJ SOLN
30.0000 mg | Freq: Once | INTRAMUSCULAR | Status: AC
  Administered 2020-05-22: 30 mg via INTRAVENOUS
  Filled 2020-05-22: qty 1

## 2020-05-22 MED ORDER — KETOROLAC TROMETHAMINE 30 MG/ML IJ SOLN: 30.0000 mg | Freq: Once | INTRAMUSCULAR | Status: DC

## 2020-05-22 MED ORDER — ASCORBIC ACID 500 MG PO TABS
500.0000 mg | ORAL_TABLET | Freq: Every day | ORAL | Status: DC
  Administered 2020-05-22 – 2020-05-23 (×2): 500 mg via ORAL
  Filled 2020-05-22 (×2): qty 1

## 2020-05-22 MED ORDER — SODIUM CHLORIDE 0.9 % IV SOLN
200.0000 mg | Freq: Once | INTRAVENOUS | Status: AC
  Administered 2020-05-22: 200 mg via INTRAVENOUS
  Filled 2020-05-22: qty 40

## 2020-05-22 MED ORDER — PROCHLORPERAZINE EDISYLATE 10 MG/2ML IJ SOLN
10.0000 mg | Freq: Once | INTRAMUSCULAR | Status: AC
  Administered 2020-05-22: 10 mg via INTRAVENOUS
  Filled 2020-05-22: qty 2

## 2020-05-22 MED ORDER — DIPHENHYDRAMINE HCL 50 MG/ML IJ SOLN
25.0000 mg | Freq: Once | INTRAMUSCULAR | Status: AC
  Administered 2020-05-22: 25 mg via INTRAVENOUS
  Filled 2020-05-22: qty 1

## 2020-05-22 MED ORDER — SODIUM CHLORIDE 0.9 % IV SOLN: 25.0000 mg | Freq: Once | INTRAVENOUS | Status: DC

## 2020-05-22 NOTE — Progress Notes (Signed)
OT Cancellation Note  Patient Details Name: BURCH MARCHUK MRN: [id number] DOB: Jan 21, 1961   Cancelled Treatment:    Reason Eval/Treat Not Completed: Fatigue/lethargy limiting ability to participate (Pt unable to be aroused after Benadryl medication given earlier this AM. OT to continue to follow. OT Eval shows imminent D/C; however, d/c is canceled for today. OT to continue to follow for OT eval.)   Jefferey Pica, OTR/L Acute Rehabilitation Services Pager: [phone] Office: [phone]   Jefferey Pica 05/22/2020, 2:34 PM

## 2020-05-22 NOTE — Evaluation (Signed)
Physical Therapy Evaluation Patient Details Name: Nicholas [name] MRN: [id number] DOB: 12-Jun-1960 Today's Date: 05/22/2020   History of Present Illness  60 y.o. male presented 05/21/20 with worsening headache in setting of recent admission for subdural hematoma.  patient was admitted 4/13-4/17 due to right vision loss secondary to head trauma.  Found to have 3 mm right and 6 mm left small subdural hematomas. MRI negative for acute changes; EEG negative COVID+ (incidental finding)  PMH-CVA with hemorrhage, DM, back pain, emphysema  Clinical Impression   Pt admitted secondary to problem above with deficits below. PTA patient was independent despite decr rt peripheral vision. Pt currently requires min assist due to lethargy (likely from morning meds for headache). Will plan to see 4/27 a.m. to reassess after meds should have worn off.  Will continue to follow acutely to maximize functional mobility independence and safety.       Follow Up Recommendations Outpatient PT;Supervision for mobility/OOB    Equipment Recommendations  None recommended by PT    Recommendations for Other Services       Precautions / Restrictions Precautions Precautions: Fall      Mobility  Bed Mobility Overal bed mobility: Needs Assistance Bed Mobility: Rolling;Sidelying to Sit;Sit to Supine Rolling: Min assist Sidelying to sit: Min guard   Sit to supine: Supervision   General bed mobility comments: slwo to initiate, requiring tactile cues at times    Transfers Overall transfer level: Needs assistance Equipment used: 1 person hand held assist Transfers: Sit to/from Stand Sit to Stand: Min assist         General transfer comment: drowsy and required facilitation to initiate  Ambulation/Gait Ambulation/Gait assistance: Min guard Gait Distance (Feet): 40 Feet Assistive device: None Gait Pattern/deviations: Drifts right/left;Step-through pattern     General Gait Details: Drifted R x 1 with  unsteadiness  Stairs            Wheelchair Mobility    Modified Rankin (Stroke Patients Only)       Balance Overall balance assessment: Mild deficits observed, not formally tested                                           Pertinent Vitals/Pain Pain Assessment: Faces Faces Pain Scale: No hurt    Home Living Family/patient expects to be discharged to:: Private residence Living Arrangements: Spouse/significant other Available Help at Discharge: Family Type of Home: House Home Access: Stairs to enter Entrance Stairs-Rails: Left Entrance Stairs-Number of Steps: 8 Home Layout: One level Home Equipment: None      Prior Function Level of Independence: Independent         Comments: Pt was fully independent PTA, drives, 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, 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     Hand Dominance   Dominant Hand: Right    Extremity/Trunk Assessment   Upper Extremity Assessment Upper Extremity Assessment: Defer to OT evaluation    Lower Extremity Assessment Lower Extremity Assessment: Generalized weakness    Cervical / Trunk Assessment Cervical / Trunk Assessment: Normal  Communication   Communication: No difficulties  Cognition Arousal/Alertness: Lethargic;Suspect due to medications Behavior During Therapy: [facility] for tasks assessed/performed Overall Cognitive Status: Difficult to assess  General Comments: Pt lethargic from earlier "headache cocktail" meds      General Comments General comments (skin integrity, edema, etc.): wife present    Exercises     Assessment/Plan    PT Assessment Patient needs continued PT services  PT Problem List Decreased mobility;Decreased knowledge of precautions;Decreased cognition;Decreased balance;Decreased activity tolerance;Decreased safety awareness       PT Treatment Interventions DME  instruction;Therapeutic activities;Gait training;Therapeutic exercise;Patient/family education;Cognitive remediation;Balance training;Functional mobility training;Stair training    PT Goals (Current goals can be found in the Care Plan section)  Acute Rehab PT Goals Patient Stated Goal: to go home PT Goal Formulation: With family Time For Goal Achievement: 06/05/20 Potential to Achieve Goals: Good    Frequency Min 3X/week   Barriers to discharge        Co-evaluation               AM-PAC PT "6 Clicks" Mobility  Outcome Measure Help needed turning from your back to your side while in a flat bed without using bedrails?: A Little Help needed moving from lying on your back to sitting on the side of a flat bed without using bedrails?: A Little Help needed moving to and from a bed to a chair (including a wheelchair)?: A Little Help needed standing up from a chair using your arms (e.g., wheelchair or bedside chair)?: A Little Help needed to walk in hospital room?: A Little Help needed climbing 3-5 steps with a railing? : A Little 6 Click Score: 18    End of Session Equipment Utilized During Treatment: Gait belt Activity Tolerance: Patient limited by lethargy Patient left: with call bell/phone within reach;with family/visitor present;in bed;with bed alarm set Nurse Communication: Mobility status PT Visit Diagnosis: Other abnormalities of gait and mobility (R26.89);Other symptoms and signs involving the nervous system (H21.975)    Time: 8832-5498 PT Time Calculation (min) (ACUTE ONLY): 19 min   Charges:   PT Evaluation $PT Eval Moderate Complexity: 1 Mod           Arby Barrette, PT Pager [phone]   Rexanne Mano 05/22/2020, 4:50 PM

## 2020-05-22 NOTE — Telephone Encounter (Signed)
Pt has Covid-19, wife is asking to be called re: how pt can go about getting scheduled with the next available provider

## 2020-05-22 NOTE — Discharge Summary (Addendum)
Nicholas [name] YTK:[id number] DOB: Aug 25, 1960 DOA: 05/21/2020  PCP: Sharilyn Sites, MD  Admit date: 05/21/2020  Discharge date: 05/23/2020  Admitted From: Home  Disposition:  Home   Recommendations for Outpatient Follow-up:   Follow up with PCP in 1-2 weeks  PCP Please obtain BMP/CBC, 2 view CXR in 1week,  (see Discharge instructions)   PCP Please follow up on the following pending results:    Home Health: None   Equipment/Devices: None  Consultations: Neuro Discharge Condition: Stable    CODE STATUS: Full    Diet Recommendation: Heart Healthy Low Carb     Chief Complaint  Patient presents with  . Seizures     Brief history of present illness from the day of admission and additional interim summary    Nicholas [name] is a 60 y.o. male with medical history significant of CAD status post DES, CVA, diabetes, hypertension, GERD hyperlipidemia, subdural hematoma, carpal tunnel who presents with worsening headache.                                                                 Hospital Course     Headache with ? mild transient acute encephalopathy - note patient has a history of with some chronic headaches so her wife no intermittent confusion and because of that evaluation, repeat EEGs, no seizure activity, CT head shjowed improving SDH collections, he was on Keppra at home and this will be continued.  Afebrile without any neck stiffness, or neuro hospital work-up needed, and was due to be discharged on 05/22/2020 but wife requested that he be kept overnight to make sure he was stable.  He worked with PT and walked well.  He has mild headache which is generalized but mostly in the frontal areas, photophobia, wants to get some IV Dilaudid for headache, he was told that Dilaudid is not the preferred medication for  headaches and he will get a Toradol shot before he leaves.  We will have him follow-up with Neuro post DC, not to drive till cleared by Neuro.    CAD  > Status post DES, he does not take Plavix anymore due to subdural hemorrhages now on home dose aspirin, he also takes as needed sublingual nitroglycerin and follows with Dr. Domenic Polite for his heart issues.  Of note after discharge paperwork was done patient complained of some nonspecific chest pain with unchanged EKG, it resolved with 1 dose of sublingual nitro.  He is now chest pain-free.  Requested him to follow-up with Dr. Domenic Polite post discharge.    Diabetes - On home regimen  Lab Results  Component Value Date   HGBA1C 8.0 (H) 05/09/2020    GERD - Continue home PPI  Chronic back pain - Continue home tramadol  Emphysematous changes on chest x-ray - Continue albuterol  Incidental Covid Infection - no Rx.   Note patient was initially discharged on 05/22/2020 but wife requested to keep him another day as she was nervous taking him home.  He was then discharged again on 05/23/2020 8 am, post discharge he requested that he receive IV Dilaudid for headache, he was given Toradol and told Dilaudid is not the right medication for headache.  He subsequently complained of some nonspecific chest pain with unchanged EKG, resolved after 1 sublingual nitro, of note he takes sublingual nitro as needed at home as well.    Wife then requested that he be re evaluated by Neuro, he was again evaluated by neurology and cleared for discharge on 05/23/2020 afternoon.    He then complained that he is having problems with swallowing food for which speech was consulted and cleared him to go home.  After that wife refused to take the patient again as she thought that he needs a different seizure medication than Keppra, I discussed with neurologist and switched him to Vimpat for 1 month.  Wife then asked me to see him again, I did, he was sitting in bed  dressed up and said wanted to go home, wife thanked me for working with her and told me she was nervous and now felt better and would take him home.    Discharge diagnosis     Principal Problem:   Acute metabolic encephalopathy Active Problems:   GERD   Backache   History of stroke   Uncontrolled type 2 diabetes mellitus with hyperglycemia ([place])   Subdural hematoma [facility])    Discharge instructions    Discharge Instructions    Ambulatory referral to Neurology   Complete by: As directed    An appointment is requested in approximately: 1 week. Patient is post hospitalization. Thanks.   Discharge instructions   Complete by: As directed    Do not drive, operate heavy machinery, perform activities at heights, swimming or participation in water activities or provide baby sitting services  until you have seen by a Neurologist and advised to do so again.      Discharge Medications   Allergies as of 05/23/2020   No Known Allergies     Medication List    STOP taking these medications   levETIRAcetam 500 MG tablet Commonly known as: KEPPRA     TAKE these medications   acetaminophen 500 MG tablet Commonly known as: TYLENOL Take 500 mg by mouth every 6 (six) hours as needed (for headaches).   albuterol 108 (90 Base) MCG/ACT inhaler Commonly known as: VENTOLIN HFA Inhale 1-2 puffs into the lungs every 6 (six) hours as needed for wheezing or shortness of breath.   aspirin 81 MG EC tablet Take 1 tablet (81 mg total) by mouth daily. Swallow whole.   BD Pen Needle Nano U/F 32G X 4 MM Misc Generic drug: Insulin Pen Needle USE AS DIRECTED FOUR TIMES DAILY.   FreeStyle Libre 2 Reader Kerrin Mo As directed   [employer] 2 Sensor Misc 1 Piece by Does not apply route every 14 (fourteen) days. What changed: how to take this   gabapentin 100 MG capsule Commonly known as: NEURONTIN Take 100 mg by mouth daily.   lacosamide 50 MG Tabs tablet Commonly known as: Vimpat Take 1  tablet (50 mg total) by mouth 2 (two) times daily.   Lantus SoloStar 100 UNIT/ML Solostar Pen Generic drug: insulin glargine Inject 20 Units into the skin at bedtime. What changed: how much to take  nitroGLYCERIN 0.4 MG SL tablet Commonly known as: NITROSTAT DISSOLVE 1 TABLET UNDER TONGUE EVERY 5 MINUTES UP TO 15 MIN FOR CHESTPAIN. IF NO RELIEF CALL 911. What changed:   how much to take  how to take this  when to take this  reasons to take this  additional instructions   ondansetron 8 MG disintegrating tablet Commonly known as: ZOFRAN-ODT Take 8 mg by mouth every 8 (eight) hours as needed for nausea or vomiting (dissolve orally).   pantoprazole 40 MG tablet Commonly known as: PROTONIX Take 40 mg by mouth daily before breakfast.   traMADol 50 MG tablet Commonly known as: ULTRAM Take 1 tablet (50 mg total) by mouth every 6 (six) hours as needed for moderate pain. What changed: when to take this            Durable Medical Equipment  (From admission, onward)         Start     Ordered   05/23/20 0956  For home use only DME Walker rolling  Once       Comments: 5 wheel  Question Answer Comment  Walker: With 5 Inch Wheels   Patient needs a walker to treat with the following condition Weakness      05/23/20 0956           Follow-up Information    Sharilyn Sites, MD. Schedule an appointment as soon as possible for a visit in 1 week(s).   Specialty: Family Medicine Contact information: [address] [place] [address] [phone]        Satira Sark, MD. Schedule an appointment as soon as possible for a visit in 1 week(s).   Specialty: Cardiology Contact information: [place] [address] [phone]        [place]. Schedule an appointment as soon as possible for a visit in 1 week(s).   Why: [facility] Contact information: [address]     [place]   [id number] [phone]              Major procedures and Radiology Reports - PLEASE review detailed and final reports thoroughly  -       CT Head Wo Contrast  Result Date: 05/21/2020 CLINICAL DATA:  60 year old male with chronic subdural hematoma. EXAM: CT HEAD WITHOUT CONTRAST TECHNIQUE: Contiguous axial images were obtained from the base of the skull through the vertex without intravenous contrast. COMPARISON:  Head CT dated 05/09/2020 and MRI dated 05/10/2020. FINDINGS: Brain: Small bilateral chronic subdural collections similar or minimally decreased in size since the CT of 05/09/2020. No acute intracranial hemorrhage. There is no mass effect or midline shift. The ventricles and sulci appropriate size for patient's age. The gray-white matter discrimination is preserved. Vascular: No hyperdense vessel or unexpected calcification. Skull: Normal. Negative for fracture or focal lesion. Sinuses/Orbits: There is partial opacification of the left maxillary sinus with air-fluid level. The remainder of the visualized paranasal sinuses and mastoid air cells are clear. Other: None IMPRESSION: 1. No acute intracranial pathology. 2. Small bilateral chronic subdural collections similar or minimally decreased in size since the CT of 05/09/2020. 3. Left maxillary sinus disease. Electronically Signed   By: Anner Crete M.D.   On: 05/21/2020 15:37   CT HEAD WO CONTRAST  Result Date: 05/09/2020 CLINICAL DATA:  Worsening headache history of subdural hematoma EXAM: CT HEAD WITHOUT CONTRAST TECHNIQUE: Contiguous axial images were obtained from the base of the skull through the vertex without intravenous  contrast. COMPARISON:  CT brain 05/09/2020, MRI 03/03/2018 FINDINGS: Brain: No large vessel territorial infarction or intracranial mass. Intermediate density left greater than right bilateral subdural hematomas. This measures up to 10 mm maximum thickness on the left and 6 mm maximum thickness on the right,  grossly stable in size when similar measurement performed on exam earlier today. Subdural collections appear slightly more dense as compared with prior, with similar appearance on the post contrasted exam performed earlier today. No midline shift. Mild mass effect on underlying brain parenchyma. No definite new hyperdense blood is seen. The ventricles are stable in size. Vascular: No hyperdense vessels. Scattered carotid vascular calcification. Skull: Normal. Negative for fracture or focal lesion. Sinuses/Orbits: No acute finding. Mucosal disease in the left maxillary sinus. Other: None IMPRESSION: 1. Intermediate density left greater than right bilateral subdural hematomas, grossly stable in size compared with exam earlier today. Subdural collections appears slightly more dense as compared with prior CT probably related to contrast media administration. No midline shift. Electronically Signed   By: Donavan Foil M.D.   On: 05/09/2020 19:04   MR BRAIN WO CONTRAST  Result Date: 05/10/2020 CLINICAL DATA:  Stroke follow-up EXAM: MRI HEAD WITHOUT CONTRAST TECHNIQUE: Multiplanar, multiecho pulse sequences of the brain and surrounding structures were obtained without intravenous contrast. COMPARISON:  CT head 05/09/2020.  MRI head 03/03/2018 FINDINGS: Brain: Negative for acute infarct. Few small white matter hyperintensities bilaterally. Negative for mass lesion Small bilateral extra-axial fluid collections. These are intermediate density on CT and are hyperdense on FLAIR. This measures approximately 3 mm on the right and 6 mm on the left. Findings compatible with small chronic subdural hemorrhage. Vascular: Normal arterial flow voids Skull and upper cervical spine: No focal skeletal lesion. Sinuses/Orbits: Mucosal edema left maxillary sinus.  Normal orbit Other: None IMPRESSION: Negative for acute infarct Small bilateral extra-axial fluid collection compatible with chronic subdural hematoma. Electronically Signed    By: Franchot Gallo M.D.   On: 05/10/2020 13:08   DG Chest Port 1 View  Result Date: 05/22/2020 CLINICAL DATA:  Shortness of breath.  COVID-19 positive EXAM: PORTABLE CHEST 1 VIEW COMPARISON:  May 21, 2020. FINDINGS: Lungs are clear. Heart size and pulmonary vascularity are within normal limits. No adenopathy. There is aortic atherosclerosis. No bone lesions. Postoperative change right apex region noted. IMPRESSION: No edema or airspace opacity. Stable cardiac silhouette. Postoperative change right apex region. Aortic Atherosclerosis (ICD10-I70.0). Electronically Signed   By: Lowella Grip III M.D.   On: 05/22/2020 07:58   DG Chest Portable 1 View  Result Date: 05/21/2020 CLINICAL DATA:  60 year old male with altered mental status. EXAM: PORTABLE CHEST 1 VIEW COMPARISON:  Chest radiograph dated 05/24/2009 FINDINGS: No focal consolidation, pleural effusion, or pneumothorax. Background of mild emphysema. Surgical sutures noted in the right apex. Top-normal cardiac size. Atherosclerotic calcification of the aorta. No acute osseous pathology. IMPRESSION: 1. No acute cardiopulmonary process. 2. Emphysema. Electronically Signed   By: Anner Crete M.D.   On: 05/21/2020 16:36   EEG adult  Result Date: 05/22/2020 Lora Havens, MD     05/22/2020 11:09 AM Patient Name: Nicholas [name] MRN: [id number] Epilepsy Attending: Lora Havens Referring Physician/Provider: Dr Donnetta Simpers Date: 05/22/2020 Duration: 22.32 mins Patient history: 60 year old male with history of left occipital stroke with hemorrhage, small bilateral chronic subdural collections presented with confusion and headache.  EEG to evaluate for seizures. Level of alertness: Awake, asleep AEDs during EEG study: LEV, GBP Technical aspects: This EEG study was done  with scalp electrodes positioned according to the 10-20 International system of electrode placement. Electrical activity was acquired at a sampling rate of 500Hz  and reviewed  with a high frequency filter of 70Hz  and a low frequency filter of 1Hz . EEG data were recorded continuously and digitally stored. Description: The posterior dominant rhythm consists of 9 Hz activity of moderate voltage (25-35 uV) seen predominantly in posterior head regions, symmetric and reactive to eye opening and eye closing. Sleep was characterized by vertex waves, sleep spindles (12 to 14 Hz), maximal frontocentral region. EEG showed continuous 3 to 5 Hz theta-delta slowing in right hemisphere, maximal right temporal region. Intermittent 3 to 5hz  theta- delta slowing was also seen in left hemisphere.  Physiologic photic driving was not seen during photic stimulation. Hyperventilation was not performed.   ABNORMALITY - Continuous slow, right hemisphere, maximal right temporal region - Intermittent slow, left hemisphere IMPRESSION: This study is suggestive of cortical dysfunction in right hemisphere, maximal right temporal region likely secondary to underlying structural abnormality, post-ictal state.  Additionally there is evidence of nonspecific cortical dysfunction in left hemisphere.  No seizures or epileptiform discharges were seen throughout the recording. Priyanka Barbra Sarks   Overnight EEG with video  Result Date: 05/10/2020 Lora Havens, MD     05/11/2020  9:34 AM Patient Name: Nicholas [name] MRN: [id number] Epilepsy Attending: Lora Havens Referring Physician/Provider: Dr Su Monks Duration: 05/10/2020 0029 to 05/11/2020 0029 Patient history: 60 year old male with left occipital stroke with some hemorrhage and 2020, presented with acute onset confusion and worsening of baseline right visual loss.  EEG to evaluate for seizures. Level of alertness: Awake, asleep AEDs during EEG study: Keppra, gabapentin Technical aspects: This EEG study was done with scalp electrodes positioned according to the 10-20 International system of electrode placement. Electrical activity was acquired at a sampling rate  of 500Hz  and reviewed with a high frequency filter of 70Hz  and a low frequency filter of 1Hz . EEG data were recorded continuously and digitally stored. Description: The posterior dominant rhythm consists of 8 Hz activity of moderate voltage (25-35 uV) seen predominantly in posterior head regions, asymmetric (L<R) and reactive to eye opening and eye closing. Sleep was characterized by vertex waves, sleep spindles (12 to 14 Hz), maximal frontocentral region.  EEG showed continuous 3 to 5 Hz theta-delta slowing in left temporo-parietal region.  Hyperventilation and photic stimulation were not performed.   ABNORMALITY -Continuous slow, left temporo-parietal region IMPRESSION: This study is suggestive of cortical dysfunction arising from left temporo-parietal region, likely secondary to underlying structural abnormality. No seizures or epileptiform discharges were seen throughout the recording. Lora Havens   CT HEAD CODE STROKE WO CONTRAST  Result Date: 05/09/2020 CLINICAL DATA:  Code stroke. Acute neuro deficit. Confusion slurred speech. EXAM: CT HEAD WITHOUT CONTRAST TECHNIQUE: Contiguous axial images were obtained from the base of the skull through the vertex without intravenous contrast. COMPARISON:  CT head 03/03/2018 FINDINGS: Brain: Bilateral extra-axial fluid collections left greater than right. These have intermediate density, slightly above CSF in likely are areas of subacute subdural hematoma. No high-density hemorrhage. Mild mass-effect on the left cerebral hemisphere. Left-sided fluid collection measures 8 mm in thickness and right-sided fluid collection measures 5 mm in thickness. Ventricle size normal. No midline shift. Negative for acute infarct or mass. Vascular: Negative for hyperdense vessel Skull: Negative Sinuses/Orbits: Mucosal edema paranasal sinuses.  Negative orbit Other: None ASPECTS ([place] Stroke Program Early CT Score) - Ganglionic level infarction (caudate, lentiform nuclei,  internal  capsule, insula, M1-M3 cortex): 7 - Supraganglionic infarction (M4-M6 cortex): 3 Total score (0-10 with 10 being normal): 10 IMPRESSION: 1. Bilateral intermediate density subdural collections left greater than right. 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 No high-density hemorrhage. Mild mass-effect on the left cerebral hemisphere without midline shift. 2. ASPECTS is 10 3. These results were called by telephone at the time of interpretation on 05/09/2020 at 2:03 pm to provider [facility] , who verbally acknowledged these results. Electronically Signed   By: Franchot Gallo M.D.   On: 05/09/2020 14:04   CT ANGIO HEAD CODE STROKE  Result Date: 05/09/2020 CLINICAL DATA:  Stroke/TIA, assess intracranial arteries. Patient confusion, slurred speech, vision loss. EXAM: CT ANGIOGRAPHY HEAD TECHNIQUE: Multidetector CT imaging of the head was performed using the standard protocol during bolus administration of intravenous contrast. Multiplanar CT image reconstructions and MIPs were obtained to evaluate the vascular anatomy. CONTRAST:  68mL OMNIPAQUE IOHEXOL 350 MG/ML SOLN COMPARISON:  Same day CT head.  MRA March 03, 2018. FINDINGS: Anterior circulation: Bilateral intracranial internal carotid arteries, middle cerebral arteries, and anterior cerebral arteries are patent without evidence of proximal hemodynamically significant stenosis. Limited evaluation of the distal vasculature due to venous contamination. Posterior circulation: Bilateral intradural vertebral arteries, basilar artery and posterior cerebral arteries are patent proximally. No evidence of proximal hemodynamically significant stenosis. Limited evaluation of the distal vessels due to venous contamination. Venous sinuses: As permitted by contrast timing, patent. Small left transverse and sigmoid sinuses. Review of the MIP images confirms the above findings. IMPRESSION: No evidence of large vessel occlusion or proximal hemodynamically significant stenosis.  Limited evaluation of the distal vasculature due to venous contamination. Electronically Signed   By: Margaretha Sheffield MD   On: 05/09/2020 14:41   CT ANGIO NECK CODE STROKE  Result Date: 05/09/2020 CLINICAL DATA:  Acute neuro deficit. Slurred speech. Headache 3 weeks. EXAM: CT ANGIOGRAPHY HEAD AND NECK TECHNIQUE: Multidetector CT imaging of the head and neck was performed using the standard protocol during bolus administration of intravenous contrast. Multiplanar CT image reconstructions and MIPs were obtained to evaluate the vascular anatomy. Carotid stenosis measurements (when applicable) are obtained utilizing NASCET criteria, using the distal internal carotid diameter as the denominator. CONTRAST:  62mL OMNIPAQUE IOHEXOL 350 MG/ML SOLN COMPARISON:  CT head 05/09/2020 FINDINGS: CTA NECK FINDINGS Aortic arch: Standard branching. Imaged portion shows no evidence of aneurysm or dissection. No significant stenosis of the major arch vessel origins. Right carotid system: Atherosclerotic calcification right carotid bifurcation. Approximately 45% diameter stenosis proximal right internal carotid artery due to calcific plaque. Left carotid system: Mild atherosclerotic calcification left carotid bifurcation without significant stenosis. Vertebral arteries: Normal vertebral arteries bilaterally. Skeleton: Mild cervical spondylosis.  No acute skeletal abnormality. Other neck: Negative for mass or edema in the neck. Upper chest: Lung apices clear bilaterally. Review of the MIP images confirms the above findings CTA HEAD FINDINGS Anterior circulation: Mild atherosclerotic calcification in the cavernous carotid bilaterally without stenosis. Anterior and middle cerebral arteries widely patent without stenosis or large vessel occlusion. Posterior circulation: Both vertebral arteries patent to the basilar. PICA patent bilaterally. Basilar widely patent. Superior cerebellar and posterior cerebral arteries normal bilaterally.  Venous sinuses: Normal venous enhancement. Anatomic variants: None Review of the MIP images confirms the above findings IMPRESSION: 1. Negative for intracranial large vessel occlusion. 2. Atherosclerotic disease carotid bifurcation with less than 50% diameter stenosis bilaterally. Both vertebral arteries widely patent. 3. These results were called by telephone at the time of interpretation on 05/09/2020 at 2:35 pm  to provider [facility] , who verbally acknowledged these results. Electronically Signed   By: Franchot Gallo M.D.   On: 05/09/2020 14:36    Micro Results     Recent Results (from the past 240 hour(s))  SARS CORONAVIRUS 2 (TAT 6-24 HRS) Nasopharyngeal Nasopharyngeal Swab     Status: Abnormal   Collection Time: 05/21/20  6:53 PM   Specimen: Nasopharyngeal Swab  Result Value Ref Range Status   SARS Coronavirus 2 POSITIVE (A) NEGATIVE Final    Comment: (NOTE) SARS-CoV-2 target nucleic acids are DETECTED.  The SARS-CoV-2 RNA is generally detectable in upper and lower respiratory specimens during the acute phase of infection. Positive results are indicative of the presence of SARS-CoV-2 RNA. Clinical correlation with patient history and other diagnostic information is  necessary to determine patient infection status. Positive results do not rule out bacterial infection or co-infection with other viruses.  The expected result is Negative.  Fact Sheet for Patients: SugarRoll.be  Fact Sheet for Healthcare Providers: https://www.woods-mathews.com/  This test is not yet approved or cleared by the [place] FDA and  has been authorized for detection and/or diagnosis of SARS-CoV-2 by FDA under an Emergency Use Authorization (EUA). This EUA will remain  in effect (meaning this test can be used) for the duration of the COVID-19 declaration under Section 564(b)(1) of the Act, 21 U. S.C. section 360bbb-3(b)(1), unless the authorization is  terminated or revoked sooner.   Performed at [facility], [place] [address], [place], [place] [address]     Today   Subjective    Nicholas [name] today has a mild headache,no chest abdominal pain,no new weakness tingling or numbness, feels much better wants to go home today.     Objective   Blood pressure (!) 117/59, pulse (!) 102, temperature 99 F (37.2 C), temperature source Oral, resp. rate 16, height 5\' 9"  (1.753 m), weight 72.6 kg, SpO2 95 %.   Intake/Output Summary (Last 24 hours) at 05/23/2020 1645 Last data filed at 05/23/2020 0300 Gross per 24 hour  Intake --  Output 325 ml  Net -325 ml    Exam  Awake Alert, No new F.N deficits, Normal affect [place].AT,PERRAL Supple Neck,No JVD, No cervical lymphadenopathy appriciated.  Symmetrical Chest wall movement, Good air movement bilaterally, CTAB RRR,No Gallops,Rubs or new Murmurs, No Parasternal Heave +ve B.Sounds, Abd Soft, Non tender, No organomegaly appriciated, No rebound -guarding or rigidity. No Cyanosis, Clubbing or edema, No new Rash or bruise   Data Review   Recent Labs  Lab 05/21/20 1605 05/22/20 0713 05/23/20 0524  WBC 11.9* 11.0* 10.5  HGB 14.1 13.7 14.7  HCT 43.2 41.3 44.3  PLT 205 212 227  MCV 82.8 81.3 81.3  MCH 27.0 27.0 27.0  MCHC 32.6 33.2 33.2  RDW 13.2 13.0 13.2  LYMPHSABS 3.2  --  2.5  MONOABS 1.3*  --  0.9  EOSABS 0.1  --  0.1  BASOSABS 0.1  --  0.1    Recent Labs  Lab 05/21/20 1605 05/22/20 0713 05/23/20 0524  NA 133*  --  138  K 4.2  --  4.0  CL 95*  --  103  CO2 26  --  25  GLUCOSE 154*  --  91  BUN 13  --  15  CREATININE 0.95  --  0.84  CALCIUM 8.9  --  9.2  AST 16  --  15  ALT 16  --  16  ALKPHOS 65  --  60  BILITOT 0.8  --  1.3*  ALBUMIN 3.6  --  3.4*  MG  --  2.0 2.0  CRP  --  2.7* 2.1*  DDIMER  --  0.64* 0.95*  AMMONIA 30  --   --   BNP  --  189.1* 116.5*     Total Time in preparing paper work, data evaluation and todays exam - 35 minutes  Lala Lund M.D on 05/23/2020 at 4:45 PM  [facility]

## 2020-05-22 NOTE — Progress Notes (Signed)
Physician paged, patients labs state he is covid positive.

## 2020-05-22 NOTE — Progress Notes (Signed)
EEG complete - results pending. EEG complete - results pending.

## 2020-05-22 NOTE — Progress Notes (Signed)
PROGRESS NOTE                                                                                                                                                                                                             Patient Demographics:    Nicholas [name], is a 60 y.o. male, DOB - March 04, 1960, RXV:[id number]  Outpatient Primary MD for the patient is Sharilyn Sites, MD    LOS - 0  Admit date - 05/21/2020    Chief Complaint  Patient presents with  . Seizures       Brief Narrative (HPI from H&P) - Nicholas [name] is a 60 y.o. male with medical history significant of CAD status post DES, CVA, diabetes, hypertension, GERD hyperlipidemia, subdural hematoma, carpal tunnel who presents with worsening headache, per wife also gets confused intermittently at home.   Subjective:    Nicholas [name] today has, mild bifrontal headache, No chest pain, No abdominal pain - No Nausea, No new weakness tingling or numbness, no SOB.   Assessment  & Plan :      1.  Ongoing headache with intermittent encephalopathy per wife.  History of chronic subdural hematomas which are stable on CT scan, no focal deficits, mild headache, EEG unremarkable, seen by neurology.  No new focal deficits.  However wife claims that she is not comfortable taking him home as at times he is unable to walk due to confusion and unable to feed himself.  We will keep him another 24 hours, PT OT evaluation.  Supportive care for headache and monitor.  Could have mild intermittent COVID encephalopathy but doubt it since his inflammatory markers are relatively normal.   2. Incidental COVID-19 infection.  No pulmonary issues, inflammatory markers relatively stable.  Monitor-   Encouraged the patient to sit up in chair in the daytime use I-S and flutter valve for pulmonary toiletry.  Will advance activity and titrate down oxygen as possible.    SpO2: 99 %  Recent Labs  Lab  05/21/20 1605 05/21/20 1853 05/22/20 0713  WBC 11.9*  --  11.0*  HGB 14.1  --  13.7  HCT 43.2  --  41.3  PLT 205  --  212  CRP  --   --  2.7*  BNP  --   --  189.1*  DDIMER  --   --  0.64*  AST 16  --   --   ALT 16  --   --   ALKPHOS 65  --   --   BILITOT 0.8  --   --   ALBUMIN 3.6  --   --   SARSCOV2NAA  --  POSITIVE*  --     3.  History of subdural hematomas.  No proven seizures but chronically on Keppra which will be continued.  4.  History of emphysema.  Stable supportive care.  Chest x-ray nonacute no hypoxia.      Condition - Extremely Guarded  Family Communication  : Wife bedside twice  Code Status : Full  Consults  :  Neuro  PUD Prophylaxis : PPI   Procedures  :            Disposition Plan  :    Status is: Observation  Dispo: The patient is from: Home              Anticipated d/c is to: Home              Patient currently is not medically stable to d/c.   Difficult to place patient No   DVT Prophylaxis  :    SCDs Start: 05/21/20 1842    Lab Results  Component Value Date   PLT 212 05/22/2020    Diet :  Diet Order            DIET SOFT Room service appropriate? Yes; Fluid consistency: Thin  Diet effective now                  Inpatient Medications  Scheduled Meds: . albuterol  2 puff Inhalation Q6H  . vitamin C  500 mg Oral Daily  . aspirin EC  81 mg Oral Daily  . gabapentin  100 mg Oral Daily  . insulin aspart  0-15 Units Subcutaneous TID WC  . insulin glargine  15 Units Subcutaneous QHS  . levETIRAcetam  500 mg Oral BID  . pantoprazole  40 mg Oral QAC breakfast  . sodium chloride flush  3 mL Intravenous Q12H  . zinc sulfate  220 mg Oral Daily   Continuous Infusions: . [START ON 05/23/2020] remdesivir 100 mg in NS 100 mL     PRN Meds:.acetaminophen **OR** acetaminophen, guaiFENesin-dextromethorphan, nitroGLYCERIN, ondansetron, polyethylene glycol, traMADol  Antibiotics  :    Anti-infectives (From admission, onward)    Start     Dose/Rate Route Frequency Ordered Stop   05/23/20 1000  remdesivir 100 mg in sodium chloride 0.9 % 100 mL IVPB       "Followed by" Linked Group Details   100 mg 200 mL/hr over 30 Minutes Intravenous Daily 05/22/20 0408 05/25/20 0959   05/22/20 0500  remdesivir 200 mg in sodium chloride 0.9% 250 mL IVPB       "Followed by" Linked Group Details   200 mg 580 mL/hr over 30 Minutes Intravenous Once 05/22/20 0408 05/22/20 5638       Time Spent in minutes  30   Lala Lund M.D on 05/22/2020 at 11:45 AM  To page go to [email or web address]   [facility] -  Office  [phone]    See all Orders from today for further details    Objective:   Vitals:   05/21/20 2300 05/22/20 0000 05/22/20 0015 05/22/20 0104  BP: (!) 137/95 125/65  128/68  Pulse: 97 94 96 87  Resp: 13 18 12    Temp:    98.5 F (  36.9 C)  TempSrc:      SpO2: 97% 95% 99%   Weight:      Height:        Wt Readings from Last 3 Encounters:  05/21/20 72.6 kg  05/09/20 73.3 kg  04/30/20 75.3 kg     Intake/Output Summary (Last 24 hours) at 05/22/2020 1145 Last data filed at 05/21/2020 1418 Gross per 24 hour  Intake 0 ml  Output 0 ml  Net 0 ml     Physical Exam  Awake Alert, No new F.N deficits, Normal affect [place].AT,PERRAL Supple Neck,No JVD, No cervical lymphadenopathy appriciated.  Symmetrical Chest wall movement, Good air movement bilaterally, CTAB RRR,No Gallops,Rubs or new Murmurs, No Parasternal Heave +ve B.Sounds, Abd Soft, No tenderness, No organomegaly appriciated, No rebound - guarding or rigidity. No Cyanosis, Clubbing or edema, No new Rash or bruise       Data Review:    CBC Recent Labs  Lab 05/21/20 1605 05/22/20 0713  WBC 11.9* 11.0*  HGB 14.1 13.7  HCT 43.2 41.3  PLT 205 212  MCV 82.8 81.3  MCH 27.0 27.0  MCHC 32.6 33.2  RDW 13.2 13.0  LYMPHSABS 3.2  --   MONOABS 1.3*  --   EOSABS 0.1  --   BASOSABS 0.1  --     Recent Labs  Lab 05/21/20 1605 05/22/20 0713   NA 133*  --   K 4.2  --   CL 95*  --   CO2 26  --   GLUCOSE 154*  --   BUN 13  --   CREATININE 0.95  --   CALCIUM 8.9  --   AST 16  --   ALT 16  --   ALKPHOS 65  --   BILITOT 0.8  --   ALBUMIN 3.6  --   MG  --  2.0  CRP  --  2.7*  DDIMER  --  0.64*  AMMONIA 30  --   BNP  --  189.1*    ------------------------------------------------------------------------------------------------------------------ No results for input(s): CHOL, HDL, LDLCALC, TRIG, CHOLHDL, LDLDIRECT in the last 72 hours.  Lab Results  Component Value Date   HGBA1C 8.0 (H) 05/09/2020   ------------------------------------------------------------------------------------------------------------------ No results for input(s): TSH, T4TOTAL, T3FREE, THYROIDAB in the last 72 hours.  Invalid input(s): FREET3  Cardiac Enzymes No results for input(s): CKMB, TROPONINI, MYOGLOBIN in the last 168 hours.  Invalid input(s): CK ------------------------------------------------------------------------------------------------------------------    Component Value Date/Time   BNP 189.1 (H) 05/22/2020 8119    Micro Results Recent Results (from the past 240 hour(s))  SARS CORONAVIRUS 2 (TAT 6-24 HRS) Nasopharyngeal Nasopharyngeal Swab     Status: Abnormal   Collection Time: 05/21/20  6:53 PM   Specimen: Nasopharyngeal Swab  Result Value Ref Range Status   SARS Coronavirus 2 POSITIVE (A) NEGATIVE Final    Comment: (NOTE) SARS-CoV-2 target nucleic acids are DETECTED.  The SARS-CoV-2 RNA is generally detectable in upper and lower respiratory specimens during the acute phase of infection. Positive results are indicative of the presence of SARS-CoV-2 RNA. Clinical correlation with patient history and other diagnostic information is  necessary to determine patient infection status. Positive results do not rule out bacterial infection or co-infection with other viruses.  The expected result is Negative.  Fact Sheet  for Patients: SugarRoll.be  Fact Sheet for Healthcare Providers: https://www.woods-mathews.com/  This test is not yet approved or cleared by the [place] FDA and  has been authorized for detection and/or diagnosis of SARS-CoV-2 by  FDA under an Emergency Use Authorization (EUA). This EUA will remain  in effect (meaning this test can be used) for the duration of the COVID-19 declaration under Section 564(b)(1) of the Act, 21 U. S.C. section 360bbb-3(b)(1), unless the authorization is terminated or revoked sooner.   Performed at [facility], [place] [address], [place], [place] [address]     Radiology Reports CT Head Wo Contrast  Result Date: 05/21/2020 CLINICAL DATA:  60 year old male with chronic subdural hematoma. EXAM: CT HEAD WITHOUT CONTRAST TECHNIQUE: Contiguous axial images were obtained from the base of the skull through the vertex without intravenous contrast. COMPARISON:  Head CT dated 05/09/2020 and MRI dated 05/10/2020. FINDINGS: Brain: Small bilateral chronic subdural collections similar or minimally decreased in size since the CT of 05/09/2020. No acute intracranial hemorrhage. There is no mass effect or midline shift. The ventricles and sulci appropriate size for patient's age. The gray-white matter discrimination is preserved. Vascular: No hyperdense vessel or unexpected calcification. Skull: Normal. Negative for fracture or focal lesion. Sinuses/Orbits: There is partial opacification of the left maxillary sinus with air-fluid level. The remainder of the visualized paranasal sinuses and mastoid air cells are clear. Other: None IMPRESSION: 1. No acute intracranial pathology. 2. Small bilateral chronic subdural collections similar or minimally decreased in size since the CT of 05/09/2020. 3. Left maxillary sinus disease. Electronically Signed   By: Anner Crete M.D.   On: 05/21/2020 15:37   CT HEAD WO CONTRAST  Result Date:  05/09/2020 CLINICAL DATA:  Worsening headache history of subdural hematoma EXAM: CT HEAD WITHOUT CONTRAST TECHNIQUE: Contiguous axial images were obtained from the base of the skull through the vertex without intravenous contrast. COMPARISON:  CT brain 05/09/2020, MRI 03/03/2018 FINDINGS: Brain: No large vessel territorial infarction or intracranial mass. Intermediate density left greater than right bilateral subdural hematomas. This measures up to 10 mm maximum thickness on the left and 6 mm maximum thickness on the right, grossly stable in size when similar measurement performed on exam earlier today. Subdural collections appear slightly more dense as compared with prior, with similar appearance on the post contrasted exam performed earlier today. No midline shift. Mild mass effect on underlying brain parenchyma. No definite new hyperdense blood is seen. The ventricles are stable in size. Vascular: No hyperdense vessels. Scattered carotid vascular calcification. Skull: Normal. Negative for fracture or focal lesion. Sinuses/Orbits: No acute finding. Mucosal disease in the left maxillary sinus. Other: None IMPRESSION: 1. Intermediate density left greater than right bilateral subdural hematomas, grossly stable in size compared with exam earlier today. Subdural collections appears slightly more dense as compared with prior CT probably related to contrast media administration. No midline shift. Electronically Signed   By: Donavan Foil M.D.   On: 05/09/2020 19:04   MR BRAIN WO CONTRAST  Result Date: 05/10/2020 CLINICAL DATA:  Stroke follow-up EXAM: MRI HEAD WITHOUT CONTRAST TECHNIQUE: Multiplanar, multiecho pulse sequences of the brain and surrounding structures were obtained without intravenous contrast. COMPARISON:  CT head 05/09/2020.  MRI head 03/03/2018 FINDINGS: Brain: Negative for acute infarct. Few small white matter hyperintensities bilaterally. Negative for mass lesion Small bilateral extra-axial fluid  collections. These are intermediate density on CT and are hyperdense on FLAIR. This measures approximately 3 mm on the right and 6 mm on the left. Findings compatible with small chronic subdural hemorrhage. Vascular: Normal arterial flow voids Skull and upper cervical spine: No focal skeletal lesion. Sinuses/Orbits: Mucosal edema left maxillary sinus.  Normal orbit Other: None IMPRESSION: Negative  for acute infarct Small bilateral extra-axial fluid collection compatible with chronic subdural hematoma. Electronically Signed   By: Franchot Gallo M.D.   On: 05/10/2020 13:08   DG Chest Port 1 View  Result Date: 05/22/2020 CLINICAL DATA:  Shortness of breath.  COVID-19 positive EXAM: PORTABLE CHEST 1 VIEW COMPARISON:  May 21, 2020. FINDINGS: Lungs are clear. Heart size and pulmonary vascularity are within normal limits. No adenopathy. There is aortic atherosclerosis. No bone lesions. Postoperative change right apex region noted. IMPRESSION: No edema or airspace opacity. Stable cardiac silhouette. Postoperative change right apex region. Aortic Atherosclerosis (ICD10-I70.0). Electronically Signed   By: Lowella Grip III M.D.   On: 05/22/2020 07:58   DG Chest Portable 1 View  Result Date: 05/21/2020 CLINICAL DATA:  60 year old male with altered mental status. EXAM: PORTABLE CHEST 1 VIEW COMPARISON:  Chest radiograph dated 05/24/2009 FINDINGS: No focal consolidation, pleural effusion, or pneumothorax. Background of mild emphysema. Surgical sutures noted in the right apex. Top-normal cardiac size. Atherosclerotic calcification of the aorta. No acute osseous pathology. IMPRESSION: 1. No acute cardiopulmonary process. 2. Emphysema. Electronically Signed   By: Anner Crete M.D.   On: 05/21/2020 16:36   EEG adult  Result Date: 05/22/2020 Lora Havens, MD     05/22/2020 11:09 AM Patient Name: FILIPPO PULS MRN: [id number] Epilepsy Attending: Lora Havens Referring Physician/Provider: Dr Donnetta Simpers Date: 05/22/2020 Duration: 22.32 mins Patient history: 60 year old male with history of left occipital stroke with hemorrhage, small bilateral chronic subdural collections presented with confusion and headache.  EEG to evaluate for seizures. Level of alertness: Awake, asleep AEDs during EEG study: LEV, GBP Technical aspects: This EEG study was done with scalp electrodes positioned according to the 10-20 International system of electrode placement. Electrical activity was acquired at a sampling rate of 500Hz  and reviewed with a high frequency filter of 70Hz  and a low frequency filter of 1Hz . EEG data were recorded continuously and digitally stored. Description: The posterior dominant rhythm consists of 9 Hz activity of moderate voltage (25-35 uV) seen predominantly in posterior head regions, symmetric and reactive to eye opening and eye closing. Sleep was characterized by vertex waves, sleep spindles (12 to 14 Hz), maximal frontocentral region. EEG showed continuous 3 to 5 Hz theta-delta slowing in right hemisphere, maximal right temporal region. Intermittent 3 to 5hz  theta- delta slowing was also seen in left hemisphere.  Physiologic photic driving was not seen during photic stimulation. Hyperventilation was not performed.   ABNORMALITY - Continuous slow, right hemisphere, maximal right temporal region - Intermittent slow, left hemisphere IMPRESSION: This study is suggestive of cortical dysfunction in right hemisphere, maximal right temporal region likely secondary to underlying structural abnormality, post-ictal state.  Additionally there is evidence of nonspecific cortical dysfunction in left hemisphere.  No seizures or epileptiform discharges were seen throughout the recording. Priyanka Barbra Sarks   Overnight EEG with video  Result Date: 05/10/2020 Lora Havens, MD     05/11/2020  9:34 AM Patient Name: MONTREZ MARIETTA MRN: [id number] Epilepsy Attending: Lora Havens Referring Physician/Provider:  Dr Su Monks Duration: 05/10/2020 0029 to 05/11/2020 0029 Patient history: 60 year old male with left occipital stroke with some hemorrhage and 2020, presented with acute onset confusion and worsening of baseline right visual loss.  EEG to evaluate for seizures. Level of alertness: Awake, asleep AEDs during EEG study: Keppra, gabapentin Technical aspects: This EEG study was done with scalp electrodes positioned according to the 10-20 International system of electrode placement. Electrical activity  was acquired at a sampling rate of 500Hz  and reviewed with a high frequency filter of 70Hz  and a low frequency filter of 1Hz . EEG data were recorded continuously and digitally stored. Description: The posterior dominant rhythm consists of 8 Hz activity of moderate voltage (25-35 uV) seen predominantly in posterior head regions, asymmetric (L<R) and reactive to eye opening and eye closing. Sleep was characterized by vertex waves, sleep spindles (12 to 14 Hz), maximal frontocentral region.  EEG showed continuous 3 to 5 Hz theta-delta slowing in left temporo-parietal region.  Hyperventilation and photic stimulation were not performed.   ABNORMALITY -Continuous slow, left temporo-parietal region IMPRESSION: This study is suggestive of cortical dysfunction arising from left temporo-parietal region, likely secondary to underlying structural abnormality. No seizures or epileptiform discharges were seen throughout the recording. Lora Havens   CT HEAD CODE STROKE WO CONTRAST  Result Date: 05/09/2020 CLINICAL DATA:  Code stroke. Acute neuro deficit. Confusion slurred speech. EXAM: CT HEAD WITHOUT CONTRAST TECHNIQUE: Contiguous axial images were obtained from the base of the skull through the vertex without intravenous contrast. COMPARISON:  CT head 03/03/2018 FINDINGS: Brain: Bilateral extra-axial fluid collections left greater than right. These have intermediate density, slightly above CSF in likely are areas of subacute  subdural hematoma. No high-density hemorrhage. Mild mass-effect on the left cerebral hemisphere. Left-sided fluid collection measures 8 mm in thickness and right-sided fluid collection measures 5 mm in thickness. Ventricle size normal. No midline shift. Negative for acute infarct or mass. Vascular: Negative for hyperdense vessel Skull: Negative Sinuses/Orbits: Mucosal edema paranasal sinuses.  Negative orbit Other: None ASPECTS (Chancellor Stroke Program Early CT Score) - Ganglionic level infarction (caudate, lentiform nuclei, internal capsule, insula, M1-M3 cortex): 7 - Supraganglionic infarction (M4-M6 cortex): 3 Total score (0-10 with 10 being normal): 10 IMPRESSION: 1. Bilateral intermediate density subdural collections left greater than right. 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 No high-density hemorrhage. Mild mass-effect on the left cerebral hemisphere without midline shift. 2. ASPECTS is 10 3. These results were called by telephone at the time of interpretation on 05/09/2020 at 2:03 pm to provider [facility] , who verbally acknowledged these results. Electronically Signed   By: Franchot Gallo M.D.   On: 05/09/2020 14:04   CT ANGIO HEAD CODE STROKE  Result Date: 05/09/2020 CLINICAL DATA:  Stroke/TIA, assess intracranial arteries. Patient confusion, slurred speech, vision loss. EXAM: CT ANGIOGRAPHY HEAD TECHNIQUE: Multidetector CT imaging of the head was performed using the standard protocol during bolus administration of intravenous contrast. Multiplanar CT image reconstructions and MIPs were obtained to evaluate the vascular anatomy. CONTRAST:  23mL OMNIPAQUE IOHEXOL 350 MG/ML SOLN COMPARISON:  Same day CT head.  MRA March 03, 2018. FINDINGS: Anterior circulation: Bilateral intracranial internal carotid arteries, middle cerebral arteries, and anterior cerebral arteries are patent without evidence of proximal hemodynamically significant stenosis. Limited evaluation of the distal vasculature due to venous  contamination. Posterior circulation: Bilateral intradural vertebral arteries, basilar artery and posterior cerebral arteries are patent proximally. No evidence of proximal hemodynamically significant stenosis. Limited evaluation of the distal vessels due to venous contamination. Venous sinuses: As permitted by contrast timing, patent. Small left transverse and sigmoid sinuses. Review of the MIP images confirms the above findings. IMPRESSION: No evidence of large vessel occlusion or proximal hemodynamically significant stenosis. Limited evaluation of the distal vasculature due to venous contamination. Electronically Signed   By: Margaretha Sheffield MD   On: 05/09/2020 14:41   CT ANGIO NECK CODE STROKE  Result Date: 05/09/2020 CLINICAL DATA:  Acute neuro  deficit. Slurred speech. Headache 3 weeks. EXAM: CT ANGIOGRAPHY HEAD AND NECK TECHNIQUE: Multidetector CT imaging of the head and neck was performed using the standard protocol during bolus administration of intravenous contrast. Multiplanar CT image reconstructions and MIPs were obtained to evaluate the vascular anatomy. Carotid stenosis measurements (when applicable) are obtained utilizing NASCET criteria, using the distal internal carotid diameter as the denominator. CONTRAST:  33mL OMNIPAQUE IOHEXOL 350 MG/ML SOLN COMPARISON:  CT head 05/09/2020 FINDINGS: CTA NECK FINDINGS Aortic arch: Standard branching. Imaged portion shows no evidence of aneurysm or dissection. No significant stenosis of the major arch vessel origins. Right carotid system: Atherosclerotic calcification right carotid bifurcation. Approximately 45% diameter stenosis proximal right internal carotid artery due to calcific plaque. Left carotid system: Mild atherosclerotic calcification left carotid bifurcation without significant stenosis. Vertebral arteries: Normal vertebral arteries bilaterally. Skeleton: Mild cervical spondylosis.  No acute skeletal abnormality. Other neck: Negative for mass  or edema in the neck. Upper chest: Lung apices clear bilaterally. Review of the MIP images confirms the above findings CTA HEAD FINDINGS Anterior circulation: Mild atherosclerotic calcification in the cavernous carotid bilaterally without stenosis. Anterior and middle cerebral arteries widely patent without stenosis or large vessel occlusion. Posterior circulation: Both vertebral arteries patent to the basilar. PICA patent bilaterally. Basilar widely patent. Superior cerebellar and posterior cerebral arteries normal bilaterally. Venous sinuses: Normal venous enhancement. Anatomic variants: None Review of the MIP images confirms the above findings IMPRESSION: 1. Negative for intracranial large vessel occlusion. 2. Atherosclerotic disease carotid bifurcation with less than 50% diameter stenosis bilaterally. Both vertebral arteries widely patent. 3. These results were called by telephone at the time of interpretation on 05/09/2020 at 2:35 pm to provider [facility] , who verbally acknowledged these results. Electronically Signed   By: Franchot Gallo M.D.   On: 05/09/2020 14:36

## 2020-05-22 NOTE — Progress Notes (Signed)
Patient arrived to 2w bed 24 stable. Able to transfer from stretcher to bed successfully. No complaints from patient at this time, other than reports of a headache. Vital signs stable. Will monitor and assess patient and care as directed, will notify physician for any new/acute changes.

## 2020-05-22 NOTE — Telephone Encounter (Signed)
Returned call to patient's wife, was able to reschedule him on 07/04/20 @ 0730 with arrival time of 0700.  She acknowledged and accepted appointment.  Patient denied further questions, verbalized understanding and expressed appreciation for the phone call.

## 2020-05-22 NOTE — Progress Notes (Signed)
HOSPITAL MEDICINE OVERNIGHT EVENT NOTE    Notified by nursing as to patient's incidental COVID positivity.  Chart reviewed, patient is currently hospitalized for acute encephalopathy undergoing work-up for various etiologies.  While it is possible that an underlying COVID-19 infection could be contributing to patient's encephalopathy, this COVID-19 infection is still deemed incidental.  Patient is not short of breath, febrile, nor did they have any symptoms of cough.  Considering patient's comorbidities patient is at high risk and therefore treatment for incidental COVID-19 infection include a 3-day course of intravenous remdesivir which will be initiated now.  Additionally, patient will be placed on zinc and vitamin C supplementation, as needed bronchodilator therapy, as needed antitussive therapy and be placed on contact and airborne isolation.  Nicholas Emerald  MD [facility]

## 2020-05-22 NOTE — Discharge Instructions (Signed)
Do not drive, operate heavy machinery, perform activities at heights, swimming or participation in water activities or provide baby sitting services  until you have seen by a Neurologist and advised to do so again.  Follow with Primary MD Sharilyn Sites, MD in 7 days   Get CBC, CMP  -  checked next visit within 1 week by Primary MD    Activity: As tolerated with Full fall precautions use walker/cane & assistance as needed  Disposition Home    Diet: Heart Healthy  Low Carb  Accuchecks 4 times/day, Once in AM empty stomach and then before each meal. Log in all results and show them to your Prim.MD in 3 days. If any glucose reading is under 80 or above 300 call your Prim MD immidiately. Follow Low glucose instructions for glucose under 80 as instructed.   Special Instructions: If you have smoked or chewed Tobacco  in the last 2 yrs please stop smoking, stop any regular Alcohol  and or any Recreational drug use.  On your next visit with your primary care physician please Get Medicines reviewed and adjusted.  Please request your Prim.MD to go over all Hospital Tests and Procedure/Radiological results at the follow up, please get all Hospital records sent to your Prim MD by signing hospital release before you go home.  If you experience worsening of your admission symptoms, develop shortness of breath, life threatening emergency, suicidal or homicidal thoughts you must seek medical attention immediately by calling 911 or calling your MD immediately  if symptoms less severe.  You Must read complete instructions/literature along with all the possible adverse reactions/side effects for all the Medicines you take and that have been prescribed to you. Take any new Medicines after you have completely understood and accpet all the possible adverse reactions/side effects.

## 2020-05-22 NOTE — Progress Notes (Addendum)
Neurology Progress Note  Brief HPI: Per wife at bedside, patient had returned to his baseline mental status last pm and this am when she arrived. Was communicating well, not confused, not aphasic or dysarthric, no weakness or an extremity, and no noted seizure like activity. However, patient just received Benadryl and he is very lethargic on NPs exam.   Per wife and chart, patient was here 05/10/20 with aphasia and confusion. A code stroke was called. No stroke was seen on imaging, but he was found to have bilateral SDH. Although, no observed seizure like activity and slowing on EEG without epileptiform or seizures, patient was started on Keppra 500mg  po bid and neurology asked that this be continued at discharge. Patient left on 05/12/20 without a prescription, reason unknown to NP. Wife called PCP and got a prescription to continue [place] until f/up visit with neurology for SDH, which was scheduled for tomorrow 05/23/20. He only had one dose before this admission.   Patient only drinks ETOH every now and then and does not do illicit drugs per wife.   EEG 05/21/20 showed slowing slowing, suggestions of cortical dysfunction in right hemisphere and maximal right temporal region selily secondary to underlying structural abnormality or post ictal state. No seizures or epileptiform discharges were found.   Keppra was restarted on admission and should continue until outpatient neurology appointment. Asked wife to reschedule appointment until after COVID isolation is complete.   O: Current vital signs: BP 128/68   Pulse 87   Temp 98.5 F (36.9 C)   Resp 12   Ht 5\' 9"  (1.753 m)   Wt 72.6 kg   SpO2 99%   BMI 23.63 kg/m  Vital signs in last 24 hours: Temp:  [98.5 F (36.9 C)] 98.5 F (36.9 C) (04/26 0104) Pulse Rate:  [87-104] 87 (04/26 0104) Resp:  [11-21] 12 (04/26 0015) BP: (110-137)/(64-95) 128/68 (04/26 0104) SpO2:  [95 %-99 %] 99 % (04/26 0015) Weight:  [72.6 kg] 72.6 kg (04/25  1420)  GENERAL: Lying in bed, very lethargic, somewhat arousable after Benadryl.  HEENT: Normocephalic and atraumatic LUNGS: Normal respiratory effort.  CV: RRR Ext: warm  NEURO:  Mental Status: Lethargic but will answer orientation questions while eyes are closed. He states place, name, age. Does not follow commands. Does not reply to day, date, month, or year.  Speech/Language: one word answers due to lethargy.  Cranial Nerves:  II: PERRL. VIII: hearing intact to voice.  Remainder of neurology exam unable to be performed due to patient's lethargy.  Medications  Current Facility-Administered Medications:  .  acetaminophen (TYLENOL) tablet 650 mg, 650 mg, Oral, Q6H PRN, 650 mg at 05/22/20 0848 **OR** acetaminophen (TYLENOL) suppository 650 mg, 650 mg, Rectal, Q6H PRN, Marcelyn Bruins, MD .  albuterol (VENTOLIN HFA) 108 (90 Base) MCG/ACT inhaler 2 puff, 2 puff, Inhalation, Q6H, Shalhoub, Sherryll Burger, MD .  ascorbic acid (VITAMIN C) tablet 500 mg, 500 mg, Oral, Daily, Shalhoub, Sherryll Burger, MD, 500 mg at 05/22/20 0848 .  aspirin EC tablet 81 mg, 81 mg, Oral, Daily, Marcelyn Bruins, MD, 81 mg at 05/22/20 0848 .  gabapentin (NEURONTIN) capsule 100 mg, 100 mg, Oral, Daily, Marcelyn Bruins, MD, 100 mg at 05/22/20 0848 .  guaiFENesin-dextromethorphan (ROBITUSSIN DM) 100-10 MG/5ML syrup 10 mL, 10 mL, Oral, Q4H PRN, Shalhoub, Sherryll Burger, MD .  insulin aspart (novoLOG) injection 0-15 Units, 0-15 Units, Subcutaneous, TID WC, Marcelyn Bruins, MD, 3 Units at 05/22/20 [phone] .  insulin glargine (LANTUS)  injection 15 Units, 15 Units, Subcutaneous, QHS, Marcelyn Bruins, MD, 15 Units at 05/21/20 2209 .  levETIRAcetam (KEPPRA) tablet 500 mg, 500 mg, Oral, BID, Marcelyn Bruins, MD, 500 mg at 05/22/20 0848 .  nitroGLYCERIN (NITROSTAT) SL tablet 0.4 mg, 0.4 mg, Sublingual, Q5 min PRN, Marcelyn Bruins, MD .  ondansetron (ZOFRAN-ODT) disintegrating tablet 8 mg, 8 mg, Oral, Q8H PRN, Marcelyn Bruins, MD .  pantoprazole (PROTONIX) EC tablet 40 mg, 40 mg, Oral, QAC breakfast, Marcelyn Bruins, MD, 40 mg at 05/22/20 0848 .  polyethylene glycol (MIRALAX / GLYCOLAX) packet 17 g, 17 g, Oral, Daily PRN, Marcelyn Bruins, MD .  Margrett Rud remdesivir 200 mg in sodium chloride 0.9% 250 mL IVPB, 200 mg, Intravenous, Once, Last Rate: 580 mL/hr at 05/22/20 0538, 200 mg at 05/22/20 0538 **FOLLOWED BY** [START ON 05/23/2020] remdesivir 100 mg in sodium chloride 0.9 % 100 mL IVPB, 100 mg, Intravenous, Daily, Shalhoub, Sherryll Burger, MD .  sodium chloride flush (NS) 0.9 % injection 3 mL, 3 mL, Intravenous, Q12H, Marcelyn Bruins, MD, 3 mL at 05/22/20 0850 .  traMADol (ULTRAM) tablet 50 mg, 50 mg, Oral, Q6H PRN, Marcelyn Bruins, MD .  zinc sulfate capsule 220 mg, 220 mg, Oral, Daily, Shalhoub, Sherryll Burger, MD, 220 mg at 05/22/20 0848  Pertinent Labs ammonia   30         Pertinent imaging personally reviewed by Dr. Quinn Axe: All performed on last admission.   EEG on last admission consistent with findings of today's EEG  CT Head-05/09/20--bilater intermediate densisty subdural collections left over right, which are likely chronic.   MRI Brain 05/09/20-Negative for acute infarct. Extra axial fluid collection compatible with chronic SDH.   CTA head and neck- no LVO on 05/09/20  Assessment : 60 yo male admitted with a history of left occipital stroke in Feb 2020, who was seen 05/09/20 for aphasia and confusion and code stroke was called. CTH and MRI did not show acute infarct, but bilateral SDH which were deemed likely chronic. Patient was discharged on 05/12/20 without a Rx for Keppra prophylactic therapy even though neurology note said to continue x 7 days. His PCP filled a Rx for him, but he only had one dose prior to this readmission on 05/21/20 for increased confusion, but with less aphasia as previous admission. Aphasia improved in 2-3 days time.  EEG on this admission without epileptiform  discharges or seizures. Patient should continue his Keppra 500mg  po q12 hours on discharge. Asked wife to call and reschedule his original appointment for tomorrow to after his COVID restrictions are lifted. Suggested he stay on his [place] until f/u with outpatient neurology.  Differentials included aphasia/confusion secondary to SDHs v. Seizures v Covid encephalopathy. Mental status was back to baseline prior to administration of Benadryl this a.m.   Impression: 1. SDH bilateral, likely chronic.  2. Encephalopathy most likely due to metabolic derangements or COVID infection. Had returned to baseline prior to Benadryl dose.  3. No seizures on EEG.   Plan: 1. Out patient f/up with neurology within 7 days of discharge or ASAP after COVID restrictions lifted.  2. Patient should be discharged on Keppra and continue it until neurology outpatient appt.  3. Patient should return to ED should a seizure be noted at home.  4. [place] seizure statutes and home seizure safety precautions reviewed with wife. These may likely be lifted after neuro outpt appt.   Per [facility] statutes, patients with seizures are  not allowed to drive until they have been seizure-free for six months.   Use caution when using heavy equipment or power tools. Avoid working on ladders or at heights. Take showers instead of baths. Ensure the water temperature is not too high on the home water heater. Do not go swimming alone. Do not lock yourself in a room alone (i.e. bathroom). When caring for infants or small children, sit down when holding, feeding, or changing them to minimize risk of injury to the child in the event you have a seizure. Maintain good sleep hygiene. Avoid alcohol.   If patient has another seizure, call 911 and bring them back to the ED if: A. The seizure lasts longer than 5 minutes.  B. The patient doesn't wake shortly after the seizure or has new problems such as difficulty seeing, speaking or moving  following the seizure C. The patient was injured during the seizure D. The patient has a temperature over 102 F (39C) E. The patient vomited during the seizure and now is having trouble breathing  Neurology will not continue to follow but will be available for questions.    Pt seen by Clance Boll, MSN, APN-BC/Nurse Practitioner/Neuro.  Pager: [phone]

## 2020-05-22 NOTE — Progress Notes (Signed)
PT Cancellation Note  Patient Details Name: Nicholas [name] MRN: [id number] DOB: [birth year]   Cancelled Treatment:    Reason Eval/Treat Not Completed: Fatigue/lethargy limiting ability to participate;Pain limiting ability to participate Pt with c/o headache this AM. Just received headache cocktail from RN which contains Benadryl. PT to re-attempt as time allows.   Lorriane Shire 05/22/2020, 10:04 AM  Lorrin Goodell, PT  Office # [phone] Pager [phone]

## 2020-05-22 NOTE — Procedures (Addendum)
Patient Name: Nicholas [name]  MRN: [id number]  Epilepsy Attending: Lora Havens  Referring Physician/Provider: Dr Donnetta Simpers Date: 05/22/2020 Duration: 22.32 mins  Patient history: 60 year old male with history of left occipital stroke with hemorrhage, small bilateral chronic subdural collections presented with confusion and headache.  EEG to evaluate for seizures.  Level of alertness: Awake, asleep  AEDs during EEG study: LEV, GBP  Technical aspects: This EEG study was done with scalp electrodes positioned according to the 10-20 International system of electrode placement. Electrical activity was acquired at a sampling rate of 500Hz  and reviewed with a high frequency filter of 70Hz  and a low frequency filter of 1Hz . EEG data were recorded continuously and digitally stored.   Description: The posterior dominant rhythm consists of 9 Hz activity of moderate voltage (25-35 uV) seen predominantly in posterior head regions, symmetric and reactive to eye opening and eye closing. Sleep was characterized by vertex waves, sleep spindles (12 to 14 Hz), maximal frontocentral region. EEG showed continuous 3 to 5 Hz theta-delta slowing in right hemisphere, maximal right temporal region. Intermittent 3 to 5hz  theta- delta slowing was also seen in left hemisphere.  Physiologic photic driving was not seen during photic stimulation. Hyperventilation was not performed.     ABNORMALITY - Continuous slow, right hemisphere, maximal right temporal region - Intermittent slow, left hemisphere  IMPRESSION: This study is suggestive of cortical dysfunction in right hemisphere, maximal right temporal region likely secondary to underlying structural abnormality, post-ictal state.  Additionally there is evidence of nonspecific cortical dysfunction in left hemisphere.  No seizures or epileptiform discharges were seen throughout the recording.  Tam Savoia Barbra Sarks

## 2020-05-23 ENCOUNTER — Ambulatory Visit: Payer: BC Managed Care – PPO | Admitting: Neurology

## 2020-05-23 ENCOUNTER — Ambulatory Visit (HOSPITAL_COMMUNITY): Payer: BC Managed Care – PPO | Admitting: Speech Pathology

## 2020-05-23 ENCOUNTER — Ambulatory Visit (HOSPITAL_COMMUNITY): Payer: BC Managed Care – PPO | Admitting: Occupational Therapy

## 2020-05-23 ENCOUNTER — Ambulatory Visit (HOSPITAL_COMMUNITY): Payer: BC Managed Care – PPO | Admitting: Physical Therapy

## 2020-05-23 DIAGNOSIS — U071 COVID-19: Secondary | ICD-10-CM | POA: Diagnosis not present

## 2020-05-23 DIAGNOSIS — E1140 Type 2 diabetes mellitus with diabetic neuropathy, unspecified: Secondary | ICD-10-CM | POA: Diagnosis not present

## 2020-05-23 DIAGNOSIS — G9341 Metabolic encephalopathy: Secondary | ICD-10-CM | POA: Diagnosis not present

## 2020-05-23 DIAGNOSIS — I2510 Atherosclerotic heart disease of native coronary artery without angina pectoris: Secondary | ICD-10-CM | POA: Diagnosis not present

## 2020-05-23 LAB — CBC WITH DIFFERENTIAL/PLATELET
Abs Immature Granulocytes: 0.03 10*3/uL (ref 0.00–0.07)
Basophils Absolute: 0.1 10*3/uL (ref 0.0–0.1)
Basophils Relative: 1 %
Eosinophils Absolute: 0.1 10*3/uL (ref 0.0–0.5)
Eosinophils Relative: 1 %
HCT: 44.3 % (ref 39.0–52.0)
Hemoglobin: 14.7 g/dL (ref 13.0–17.0)
Immature Granulocytes: 0 %
Lymphocytes Relative: 24 %
Lymphs Abs: 2.5 10*3/uL (ref 0.7–4.0)
MCH: 27 pg (ref 26.0–34.0)
MCHC: 33.2 g/dL (ref 30.0–36.0)
MCV: 81.3 fL (ref 80.0–100.0)
Monocytes Absolute: 0.9 10*3/uL (ref 0.1–1.0)
Monocytes Relative: 8 %
Neutro Abs: 6.9 10*3/uL (ref 1.7–7.7)
Neutrophils Relative %: 66 %
Platelets: 227 10*3/uL (ref 150–400)
RBC: 5.45 MIL/uL (ref 4.22–5.81)
RDW: 13.2 % (ref 11.5–15.5)
WBC: 10.5 10*3/uL (ref 4.0–10.5)
nRBC: 0 % (ref 0.0–0.2)

## 2020-05-23 LAB — C-REACTIVE PROTEIN: CRP: 2.1 mg/dL — ABNORMAL HIGH (ref <1.0)

## 2020-05-23 LAB — COMPREHENSIVE METABOLIC PANEL
ALT: 16 U/L (ref 0–44)
AST: 15 U/L (ref 15–41)
Albumin: 3.4 g/dL — ABNORMAL LOW (ref 3.5–5.0)
Alkaline Phosphatase: 60 U/L (ref 38–126)
Anion gap: 10 (ref 5–15)
BUN: 15 mg/dL (ref 8–23)
CO2: 25 mmol/L (ref 22–32)
Calcium: 9.2 mg/dL (ref 8.9–10.3)
Chloride: 103 mmol/L (ref 98–111)
Creatinine, Ser: 0.84 mg/dL (ref 0.61–1.24)
GFR, Estimated: >60 mL/min (ref >60)
Glucose, Bld: 91 mg/dL (ref 70–99)
Potassium: 4 mmol/L (ref 3.5–5.1)
Sodium: 138 mmol/L (ref 135–145)
Total Bilirubin: 1.3 mg/dL — ABNORMAL HIGH (ref 0.3–1.2)
Total Protein: 6.5 g/dL (ref 6.5–8.1)

## 2020-05-23 LAB — D-DIMER, QUANTITATIVE: D-Dimer, Quant: 0.95 ug/mL-FEU — ABNORMAL HIGH (ref 0.00–0.50)

## 2020-05-23 LAB — GLUCOSE, CAPILLARY
Glucose-Capillary: 81 mg/dL (ref 70–99)
Glucose-Capillary: 122 mg/dL — ABNORMAL HIGH (ref 70–99)

## 2020-05-23 LAB — MAGNESIUM: Magnesium: 2 mg/dL (ref 1.7–2.4)

## 2020-05-23 LAB — BRAIN NATRIURETIC PEPTIDE: B Natriuretic Peptide: 116.5 pg/mL — ABNORMAL HIGH (ref 0.0–100.0)

## 2020-05-23 MED ORDER — LACOSAMIDE 50 MG PO TABS
50.0000 mg | ORAL_TABLET | Freq: Two times a day (BID) | ORAL | 0 refills | Status: DC
Start: 2020-05-23 — End: 2020-07-04

## 2020-05-23 MED ORDER — KETOROLAC TROMETHAMINE 30 MG/ML IJ SOLN
30.0000 mg | Freq: Once | INTRAMUSCULAR | Status: AC
  Administered 2020-05-23: 30 mg via INTRAVENOUS
  Filled 2020-05-23: qty 1

## 2020-05-23 MED ORDER — LACOSAMIDE 50 MG PO TABS: 50.0000 mg | ORAL_TABLET | Freq: Two times a day (BID) | ORAL | Status: DC

## 2020-05-23 NOTE — TOC Initial Note (Addendum)
Transition of Care [facility]) - Initial/Assessment Note    Patient Details  Name: Nicholas [name] MRN: [id number] Date of Birth: 06/15/1960  Transition of Care [facility] CM/SW Contact:    Angelita Ingles, RN Phone Number: [phone]  05/23/2020, 1:31 PM  Clinical Narrative:                 [facility] consulted for a patient needing outpatient rehab therapies. Wife is currently at bedside with the patient and states that patient is currently scheduled to be seen at  [facility] outpatient rehab. Patient was schedules to begin outpatient therapy today but session was cancelled. Wife is aware that patient must wait 10 days before patient can go to outpatient therapy. Wife has made outpatient therapy aware that patient is covid positive. Patient will not be able initiate therapy for 10 days after positive covid test. Currently there are no other needs noted. TOC will continue to follow.  1450 Rolling walker ordered per [employer]  Wife made aware that DME will be delivered to the room.     Expected Discharge Plan: Home/Self Care (outpatient therapy) Barriers to Discharge: Continued Medical Work up   Patient Goals and CMS Choice Patient states their goals for this hospitalization and ongoing recovery are:: patient unable to answer question at this time CMS Medicare.gov Compare Post Acute Care list provided to:: Patient Represenative (must comment) (wife Joelene Millin) Choice offered to / list presented to : Spouse  Expected Discharge Plan and Services Expected Discharge Plan: Home/Self Care (outpatient therapy) In-house Referral: NA Discharge Planning Services: CM Consult Post Acute Care Choice: NA Living arrangements for the past 2 months: Single Family Home Expected Discharge Date: 05/23/20               DME Arranged: N/A DME Agency: NA       HH Arranged: NA [place] Agency: NA        Prior Living Arrangements/Services Living arrangements for the past 2 months: Single Family Home Lives with::  Spouse Patient language and need for interpreter reviewed:: Yes Do you feel safe going back to the place where you live?: Yes      Need for Family Participation in Patient Care: Yes (Comment) Care giver support system in place?: Yes (comment) Current home services:  (n/a) Criminal Activity/Legal Involvement Pertinent to Current Situation/Hospitalization: No - Comment as needed  Activities of Daily Living      Permission Sought/Granted   Permission granted to share information with : No              Emotional Assessment Appearance:: Appears stated age Attitude/Demeanor/Rapport: Lethargic,Inconsistent Affect (typically observed): Quiet Orientation: : Oriented to Self,Fluctuating Orientation (Suspected and/or reported Sundowners) Alcohol / Substance Use: Not Applicable Psych Involvement: No (comment)  Admission diagnosis:  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 Acute metabolic encephalopathy 69/67/8938  . Receptive aphasia   . Subdural hematoma ([place]) 05/09/2020  . Polypharmacy 06/17/2019  . Encounter for screening colonoscopy 06/17/2019  . Vitamin D deficiency 05/10/2019  . Diabetic peripheral neuropathy ([place]) 11/24/2018  . Bilateral carpal tunnel syndrome 11/24/2018  . Acute ischemic stroke ([place]) 03/03/2018  . Uncontrolled type 2 diabetes mellitus with hyperglycemia ([place]) 04/21/2017  . Hyperlipemia 04/21/2016  . Malnutrition of moderate degree 02/11/2016  . History of stroke 02/11/2016  . TIA (transient ischemic attack) 02/09/2016  . Right carotid bruit  02/09/2016  . Personal history of noncompliance with medical treatment, presenting hazards to health 02/09/2016  . Type 2 diabetes mellitus with vascular disease ([place]) 12/12/2014  . Essential hypertension,  benign 12/12/2014  . Hyperlipidemia 12/12/2014  . 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 TOBACCO ABUSE 07/26/2008  . CORONARY ATHEROSCLEROSIS NATIVE CORONARY ARTERY 07/26/2008  . GERD 07/25/2008  . Backache 07/25/2008   PCP:  Sharilyn Sites, MD Pharmacy:   [facility] [address], [place] [address] Phone: [phone] Fax: [place], [place] [address] Phone: [phone] Fax: [phone]     Social Determinants of Health (SDOH) Interventions    Readmission Risk Interventions No flowsheet data found.

## 2020-05-23 NOTE — Progress Notes (Signed)
Prescription for Vimpat provided to Mrs. Seabron Spates. Patient escorted to car, with no additional complaints.

## 2020-05-23 NOTE — Progress Notes (Signed)
PT Cancellation Note  Patient Details Name: Nicholas [name] MRN: [id number] DOB: 07/11/1960   Cancelled Treatment:    Reason Eval/Treat Not Completed: Medical issues which prohibited therapy   Patient having 10/10 chest pain. Awaiting MD to review EKG. Will attempt to see this pm as appropriate.   Arby Barrette, PT Pager [phone]   Rexanne Mano 05/23/2020, 11:20 AM

## 2020-05-23 NOTE — Evaluation (Signed)
Clinical/Bedside Swallow Evaluation Patient Details  Name: Nicholas [name] MRN: [id number] Date of Birth: 11/11/1960  Today's Date: 05/23/2020 Time: SLP Start Time (ACUTE ONLY): 4174 SLP Stop Time (ACUTE ONLY): 1557 SLP Time Calculation (min) (ACUTE ONLY): 30.62 min  Past Medical History:  Past Medical History:  Diagnosis Date  . Anal lesion    Anal papilla/tags - external lesion  . Bilateral carpal tunnel syndrome 11/24/2018  . Colon polyp    Hyperplastic rectosigmoid polyp 9/08  . Coronary atherosclerosis of native coronary artery    DES to PLB, BMS to mid and distal RCA, DES to circumflex and distal LAD, 6/10 (5 stents)  . Diabetic peripheral neuropathy ([place]) 11/24/2018  . GERD (gastroesophageal reflux disease)   . Helicobacter pylori (H. pylori) 09/2009   Treated with helidac  . Hiatal hernia   . Hyperplastic colon polyp 2008  . Mixed hyperlipidemia   . Myocardial infarction ([place]) 06/2008  . Stroke [facility])    January 2018  . Type 2 diabetes mellitus ([place])    Past Surgical History:  Past Surgical History:  Procedure Laterality Date  . CHOLECYSTECTOMY    . COLLAPSED LUNG     15 years ago  . COLONOSCOPY  10/12/2006   Dr. Gala Romney- rectosigmoid polyp s/phot snare polypectomy o/w normal rectum and terminal ileum. hyperplastic polyp on bx  . COLONOSCOPY WITH PROPOFOL N/A 09/01/2019   Procedure: COLONOSCOPY WITH PROPOFOL;  Surgeon: Daneil Dolin, MD;  [facility];  Service: Endoscopy;  Laterality: N/A;  8:30am  . EP IMPLANTABLE DEVICE N/A 02/12/2016   Procedure: Loop Recorder Insertion;  Surgeon: Thompson Grayer, MD;  Location: [place] CV LAB;  Service: Cardiovascular;  Laterality: N/A;  . ESOPHAGOGASTRODUODENOSCOPY  10/12/2006   Dr. Gala Romney- examination of the tubular esophagus revealed no mucosal abnormalities. the EG junction was easily traversed. small hiatal hernia, the gastric mucosa o/w appeared normal. there was no infiltrating process or frank ulcer seen.  .  implantable loop recorder removal  09/29/2019   MDT Reveal LINQ removed in office by Dr Rayann Heman  . TEE WITHOUT CARDIOVERSION N/A 02/12/2016   Procedure: TRANSESOPHAGEAL ECHOCARDIOGRAM (TEE);  Surgeon: Jerline Pain, MD;  Location: [facility] ENDOSCOPY;  Service: Cardiovascular;  Laterality: N/A;  . WIRE IN APEX OF RIGHT LUNG     Since childhood   HPI:  Pt is a 60 y.o. male with medical history significant of CAD s/p DES, CVA, diabetes, hypertension, GERD hyperlipidemia, subdural hematoma (admission 4/13-4/17), carpal tunnel. Pt presented with worsening headache and confusion. Chest x-ray with no acute abnormality and emphysematous changes.  CT head with no acute abnormality and minimal decrease in the size of hematomas. Pt found to be COVID+.   Assessment / Plan / Recommendation Clinical Impression  Pt was seen for bedside swallow evaluation with his wife present. Pt's wife denied the pt having a history of oropharyngeal dysphagia prior to admission. However, per the wife, the pt has been confused and lethargic since admission and has been demonstrating difficulty containing boluses in the oral cavity, and has coughed with liquids. Pt's wife indicated that the pt's level of alertness improved by lunch and that his swallow function, though not at baseline, was much improved compared to that noted during breakfast. She stated that the pt only coughed once with liquids and that solids/liquids were not "falling out of his mouth". Pt demonstrated symptoms of oral phase dysphagia which are likely cognitively based. He demonstrated mildly reduced bolus awareness which resulted in occasional anterior spillage of  liquids. No s/sx of aspiration were noted with solids, dual consistency boluses or with thin liquids via cup/straw. Mastication and oral clearance were adequate. Pt may have up to a regular texture diet at this time, but pt's wife indicated that she would prefer that the pt continue a soft diet. Pt currently has  discharge orders and she expressed that she is comfortable with his discharge based on his presentation during lunch. She was educated regarding swallowing precautions to reduce aspiration risk and she verbalized understanding as well as agreement. SLP Visit Diagnosis: Dysphagia, unspecified (R13.10)    Aspiration Risk  Mild aspiration risk    Diet Recommendation Regular;Thin liquid (Pt's wife has requested that he remain on soft solids)   Liquid Administration via: Cup;Straw Medication Administration: Whole meds with puree Supervision: Staff to assist with self feeding;Full supervision/cueing for compensatory strategies Compensations: Slow rate;Small sips/bites;Minimize environmental distractions Postural Changes: Seated upright at 90 degrees    Other  Recommendations Oral Care Recommendations: Oral care BID   Follow up Recommendations Outpatient SLP      Frequency and Duration min 2x/week  1 week       Prognosis Prognosis for Safe Diet Advancement: Good Barriers to Reach Goals: Cognitive deficits;Language deficits      Swallow Study   General Date of Onset: 05/23/20 HPI: Pt is a 60 y.o. male with medical history significant of CAD s/p DES, CVA, diabetes, hypertension, GERD hyperlipidemia, subdural hematoma (admission 4/13-4/17), carpal tunnel. Pt presented with worsening headache and confusion. Chest x-ray with no acute abnormality and emphysematous changes.  CT head with no acute abnormality and minimal decrease in the size of hematomas. Pt found to be COVID+. Type of Study: Bedside Swallow Evaluation Previous Swallow Assessment: none Diet Prior to this Study: Regular;Thin liquids Temperature Spikes Noted: No Respiratory Status: Room air History of Recent Intubation: No Behavior/Cognition: Alert;Confused Oral Care Completed by SLP: No Oral Cavity - Dentition: Dentures, top;Dentures, bottom Vision: Functional for self-feeding Self-Feeding Abilities: Needs assist Patient  Positioning: Upright in bed;Postural control adequate for testing Volitional Swallow: Able to elicit    Oral/Motor/Sensory Function Overall Oral Motor/Sensory Function: Within functional limits   Ice Chips Ice chips: Within functional limits Presentation: Spoon   Thin Liquid Thin Liquid: Within functional limits Presentation: Cup;Straw Other Comments:  (occasional medial anterior spillage noted.)    Nectar Thick Nectar Thick Liquid: Not tested   Honey Thick Honey Thick Liquid: Not tested   Puree Puree: Within functional limits Presentation: Spoon   Solid     Solid: Within functional limits Presentation: [place] I. Hardin Negus, [place], [place] Office number [phone] Pager [place] 05/23/2020,4:19 PM

## 2020-05-23 NOTE — Progress Notes (Signed)
Patient previously had complaints of chest pain and trouble swallowing on shift. Patient says chest pain has resolved after the one dose of nitroglycerin provided. Patient is also able to tolerate some foods, and will continue to progress diet as tolerated.   Attempted to provide AVS. Wife is not wanting to leave. Says she does not feel she has been provided a safe discharge. Says that patient is hallucinating. Says she believes it is related to [place] because that is the only new med he has started. Says that seizure medication may need to be changed and she does not have a follow up appointment with neuro until June. Dr. Candiss Norse notified.

## 2020-05-23 NOTE — Progress Notes (Signed)
Patient c/o 10/10 chest pain. Vitals stable. EKG completed. S/W with Dr. Candiss Norse via phone to alert.

## 2020-05-23 NOTE — Plan of Care (Addendum)
Neurology plan of care: Patient was refusing to leave until spoken to by our service. He was asking for narcotics earlier for his HA, but was told it was not indicated and to just take Tylenol OTC. NP went to bedside to speak to patient and his wife. Patient is alert and oriented and smiling. Conversing without slurring or aphasia. He had some chest pain earlier which was attending by hospitalist with a normal EKG. Patient told he was able to leave from a neurology stand point. NP reminded his wife about making a neurology appointment ASAP. His COVID restrictions are up today. NP reminded wife that patient is to continue to take Keppra for at least 7 days after discharge. Wife concerned that she will not be able to get into office quickly enough. NP made ambulatory out patient appt for one week after discharge and office will call her.   Above communicated to Dr. Candiss Norse, [facility].   No charge note.   Clance Boll, NP Neurology

## 2020-05-23 NOTE — Progress Notes (Signed)
OT Cancellation Note  Patient Details Name: Nicholas [name] MRN: [id number] DOB: May 26, 1960   Cancelled Treatment:    Reason Eval/Treat Not Completed: Patient at procedure or test/ unavailable (Pt's spouse reported pt having chest pain; RN notified and pt STAT EKG obtained. Will await for MD to read before proceeding with OT eval. OTR to continue to follow.)  Cedra Villalon C 05/23/2020, 10:39 AM

## 2020-05-29 ENCOUNTER — Other Ambulatory Visit: Payer: Self-pay

## 2020-05-29 ENCOUNTER — Ambulatory Visit (HOSPITAL_COMMUNITY): Payer: BC Managed Care – PPO | Attending: Internal Medicine | Admitting: Physical Therapy

## 2020-05-29 ENCOUNTER — Encounter (HOSPITAL_COMMUNITY): Payer: Self-pay | Admitting: Physical Therapy

## 2020-05-29 DIAGNOSIS — R2689 Other abnormalities of gait and mobility: Secondary | ICD-10-CM | POA: Insufficient documentation

## 2020-05-29 DIAGNOSIS — R41841 Cognitive communication deficit: Secondary | ICD-10-CM | POA: Diagnosis present

## 2020-05-29 NOTE — Therapy (Signed)
[facility] [address] [place], [place], [address] Phone: [phone]   Fax:  [phone]  Physical Therapy Evaluation  Patient Details  Name: Nicholas [name] MRN: [id number] Date of Birth: February 17, 1960 Referring Provider (PT): Subdural Hematoma   Encounter Date: 05/29/2020   PT End of Session - 05/29/20 1109    Visit Number 1    Number of Visits 1    Date for PT Re-Evaluation 05/29/20    Authorization Type BCBS (no auth required, 20 vl)    PT Start Time 1045    PT Stop Time 1106    PT Time Calculation (min) 21 min    Activity Tolerance Patient limited by lethargy    Behavior During Therapy [facility] for tasks assessed/performed           Past Medical History:  Diagnosis Date  . Anal lesion    Anal papilla/tags - external lesion  . Bilateral carpal tunnel syndrome 11/24/2018  . Colon polyp    Hyperplastic rectosigmoid polyp 9/08  . Coronary atherosclerosis of native coronary artery    DES to PLB, BMS to mid and distal RCA, DES to circumflex and distal LAD, 6/10 (5 stents)  . Diabetic peripheral neuropathy ([place]) 11/24/2018  . GERD (gastroesophageal reflux disease)   . Helicobacter pylori (H. pylori) 09/2009   Treated with helidac  . Hiatal hernia   . Hyperplastic colon polyp 2008  . Mixed hyperlipidemia   . Myocardial infarction ([place]) 06/2008  . Stroke [facility])    January 2018  . Type 2 diabetes mellitus ([place])     Past Surgical History:  Procedure Laterality Date  . CHOLECYSTECTOMY    . COLLAPSED LUNG     15 years ago  . COLONOSCOPY  10/12/2006   Dr. Gala Romney- rectosigmoid polyp s/phot snare polypectomy o/w normal rectum and terminal ileum. hyperplastic polyp on bx  . COLONOSCOPY WITH PROPOFOL N/A 09/01/2019   Procedure: COLONOSCOPY WITH PROPOFOL;  Surgeon: Daneil Dolin, MD;  [facility];  Service: Endoscopy;  Laterality: N/A;  8:30am  . EP IMPLANTABLE DEVICE N/A 02/12/2016   Procedure: Loop Recorder Insertion;   Surgeon: Thompson Grayer, MD;  Location: [place] CV LAB;  Service: Cardiovascular;  Laterality: N/A;  . ESOPHAGOGASTRODUODENOSCOPY  10/12/2006   Dr. Gala Romney- examination of the tubular esophagus revealed no mucosal abnormalities. the EG junction was easily traversed. small hiatal hernia, the gastric mucosa o/w appeared normal. there was no infiltrating process or frank ulcer seen.  . implantable loop recorder removal  09/29/2019   MDT Reveal LINQ removed in office by Dr Rayann Heman  . TEE WITHOUT CARDIOVERSION N/A 02/12/2016   Procedure: TRANSESOPHAGEAL ECHOCARDIOGRAM (TEE);  Surgeon: Jerline Pain, MD;  Location: [facility] ENDOSCOPY;  Service: Cardiovascular;  Laterality: N/A;  . WIRE IN APEX OF RIGHT LUNG     Since childhood    There were no vitals filed for this visit.    Subjective Assessment - 05/29/20 1049    Subjective Patient is a 60 y.o. male who presents to physical therapy s/p subdural hematoma. He states he had a seizure a few weeks ago. He was having some miss steps and off balance but has not had any in a few days. His strength feels decent. He has not had any dizziness in last few days. Dizziness comes on randomly. His first seizure that started a few weeks ago. He is having trouble more with memory.    Pertinent History subdural hematoma, DM, Smokes, hx CVA  Currently in Pain? No/denies              [facility] PT Assessment - 05/29/20 0001      Assessment   Medical Diagnosis Antonieta Pert MD    Referring Provider (PT) Subdural Hematoma    Onset Date/Surgical Date 05/08/20    Prior Therapy hip      Precautions   Precautions None      Restrictions   Weight Bearing Restrictions No      Balance Screen   Has the patient fallen in the past 6 months Yes    How many times? 1    Has the patient had a decrease in activity level because of a fear of falling?  No    Is the patient reluctant to leave their home because of a fear of falling?  No      Prior Function   Level of Independence  Independent    Vocation Full time employment      Cognition   Overall Cognitive Status Within Functional Limits for tasks assessed      Observation/Other Assessments   Observations Ambulates without AD    Focus on Therapeutic Outcomes (FOTO)  n/a      ROM / Strength   AROM / PROM / Strength Strength      Strength   Strength Assessment Site Shoulder;Elbow;Wrist;Hand;Hip;Knee;Ankle    Right/Left Shoulder Right;Left    Right Shoulder ABduction 5/5    Left Shoulder Flexion 5/5    Right/Left Elbow Right;Left    Right Elbow Flexion 5/5    Right Elbow Extension 5/5    Left Elbow Flexion 5/5    Left Elbow Extension 5/5    Right/Left Wrist Right;Left    Right Wrist Flexion 5/5    Right Wrist Extension 5/5    Left Wrist Flexion 5/5    Left Wrist Extension 5/5    Right/Left hand Right;Left    Right Hand Gross Grasp Functional    Left Hand Gross Grasp Functional    Right/Left Hip Right;Left    Right Hip Flexion 5/5    Left Hip Flexion 5/5    Right/Left Knee Right;Left    Right Knee Flexion 5/5    Right Knee Extension 5/5    Left Knee Flexion 5/5    Left Knee Extension 5/5    Right/Left Ankle Right;Left    Right Ankle Dorsiflexion 5/5    Left Ankle Dorsiflexion 5/5      Transfers   Comments independent      Ambulation/Gait   Ambulation/Gait Yes    Ambulation/Gait Assistance 7: Independent      Standardized Balance Assessment   Standardized Balance Assessment Dynamic Gait Index      Dynamic Gait Index   Level Surface Normal    Change in Gait Speed Normal    Gait with Horizontal Head Turns Mild Impairment   mild dizziness feeling   Gait with Vertical Head Turns Normal    Gait and Pivot Turn Normal    Step Over Obstacle Normal    Step Around Obstacles Normal    Steps Normal    Total Score 23                      Objective measurements completed on examination: See above findings.               PT Education - 05/29/20 1041    Education  Details Paitent educated on exam findings, POC, scope of PT  Person(s) Educated Patient    Methods Explanation;Demonstration    Comprehension Verbalized understanding;Returned demonstration            PT Short Term Goals - 05/29/20 1116      PT SHORT TERM GOAL #1   Title Patient will be educated on exam findings, scope of PT    Time 1    Period Days    Status Achieved    Target Date 05/29/20                     Plan - 05/29/20 1111    Clinical Impression Statement Patient is a 60 y.o. male who presents to physical therapy s/p subdural hematoma. Patient states recent hospitalization following seizure in which he was having dizziness and balance deficits. Symptoms have since resolved. Patient continues to have cognitive/memory deficits and will f/u with SLP. Patient without deficits in strength, balance, and gait today. Patient without UE difficulty as well, cancelled OT appointment for now. Patient educated on obtaining a new referral should any other issues arise. Patient does not require additional physical therapy services at this time.    Personal Factors and Comorbidities Comorbidity 3+;Age;Behavior Pattern;Past/Current Experience;Social Background    Comorbidities DM, smokes, hx CVA    Examination-Activity Limitations Locomotion Level    Examination-Participation Restrictions Occupation    Stability/Clinical Decision Making Stable/Uncomplicated    Clinical Decision Making Low    Rehab Potential Good    PT Frequency One time visit    PT Treatment/Interventions ADLs/Self Care Home Management;Gait training;Stair training;Functional mobility training;Therapeutic activities;Therapeutic exercise;Balance training;DME Instruction;Patient/family education;Neuromuscular re-education    PT Next Visit Plan n/a    Recommended Other Services SLP    Consulted and Agree with Plan of Care Patient           Patient will benefit from skilled therapeutic intervention in order to  improve the following deficits and impairments:  Decreased balance,Decreased mobility  Visit Diagnosis: Other abnormalities of gait and mobility     Problem List Patient Active Problem List   Diagnosis Date Noted  . Acute metabolic encephalopathy 40/76/8088  . Receptive aphasia   . Subdural hematoma ([place]) 05/09/2020  . Polypharmacy 06/17/2019  . Encounter for screening colonoscopy 06/17/2019  . Vitamin D deficiency 05/10/2019  . Diabetic peripheral neuropathy ([place]) 11/24/2018  . Bilateral carpal tunnel syndrome 11/24/2018  . Acute ischemic stroke ([place]) 03/03/2018  . Uncontrolled type 2 diabetes mellitus with hyperglycemia ([place]) 04/21/2017  . Hyperlipemia 04/21/2016  . Malnutrition of moderate degree 02/11/2016  . History of stroke 02/11/2016  . TIA (transient ischemic attack) 02/09/2016  . Right carotid bruit 02/09/2016  . Personal history of noncompliance with medical treatment, presenting hazards to health 02/09/2016  . Type 2 diabetes mellitus with vascular disease ([place]) 12/12/2014  . Essential hypertension, benign 12/12/2014  . Hyperlipidemia 12/12/2014  . 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 TOBACCO ABUSE 07/26/2008  . CORONARY ATHEROSCLEROSIS NATIVE CORONARY ARTERY 07/26/2008  . GERD 07/25/2008  . Backache 07/25/2008    11:18 AM, 05/29/20 Mearl Latin PT, DPT Physical Therapist at [facility], [place], [address] Phone: [phone]   Fax:  [phone]  Name: Nicholas [name] MRN: [id number] Date of Birth: 1960-09-07

## 2020-05-30 ENCOUNTER — Ambulatory Visit (HOSPITAL_COMMUNITY): Payer: BC Managed Care – PPO | Admitting: Occupational Therapy

## 2020-05-31 ENCOUNTER — Encounter (HOSPITAL_COMMUNITY): Payer: Self-pay | Admitting: Speech Pathology

## 2020-05-31 ENCOUNTER — Ambulatory Visit (HOSPITAL_COMMUNITY): Payer: BC Managed Care – PPO | Admitting: Speech Pathology

## 2020-05-31 ENCOUNTER — Other Ambulatory Visit: Payer: Self-pay

## 2020-05-31 DIAGNOSIS — R2689 Other abnormalities of gait and mobility: Secondary | ICD-10-CM | POA: Diagnosis not present

## 2020-05-31 DIAGNOSIS — R41841 Cognitive communication deficit: Secondary | ICD-10-CM

## 2020-05-31 NOTE — Therapy (Signed)
[place], [place], [address] Phone: [phone]   Fax:  [phone]  Speech Language Pathology Evaluation  Patient Details  Name: Nicholas [name] MRN: [id number] Date of Birth: July 21, 1960 Referring Provider (SLP): Antonieta Pert, MD   Encounter Date: 05/31/2020    Past Medical History:  Diagnosis Date  . Anal lesion    Anal papilla/tags - external lesion  . Bilateral carpal tunnel syndrome 11/24/2018  . Colon polyp    Hyperplastic rectosigmoid polyp 9/08  . Coronary atherosclerosis of native coronary artery    DES to PLB, BMS to mid and distal RCA, DES to circumflex and distal LAD, 6/10 (5 stents)  . Diabetic peripheral neuropathy ([place]) 11/24/2018  . GERD (gastroesophageal reflux disease)   . Helicobacter pylori (H. pylori) 09/2009   Treated with helidac  . Hiatal hernia   . Hyperplastic colon polyp 2008  . Mixed hyperlipidemia   . Myocardial infarction ([place]) 06/2008  . Stroke [facility])    January 2018  . Type 2 diabetes mellitus ([place])     Past Surgical History:  Procedure Laterality Date  . CHOLECYSTECTOMY    . COLLAPSED LUNG     15 years ago  . COLONOSCOPY  10/12/2006   Dr. Gala Romney- rectosigmoid polyp s/phot snare polypectomy o/w normal rectum and terminal ileum. hyperplastic polyp on bx  . COLONOSCOPY WITH PROPOFOL N/A 09/01/2019   Procedure: COLONOSCOPY WITH PROPOFOL;  Surgeon: Daneil Dolin, MD;  [facility];  Service: Endoscopy;  Laterality: N/A;  8:30am  . EP IMPLANTABLE DEVICE N/A 02/12/2016   Procedure: Loop Recorder Insertion;  Surgeon: Thompson Grayer, MD;  Location: [facility] CV LAB;  Service: Cardiovascular;  Laterality: N/A;  . ESOPHAGOGASTRODUODENOSCOPY  10/12/2006   Dr. Gala Romney- examination of the tubular esophagus revealed no mucosal abnormalities. the EG junction was easily traversed. small hiatal hernia, the gastric mucosa o/w appeared normal. there was no infiltrating process or frank  ulcer seen.  . implantable loop recorder removal  09/29/2019   MDT Reveal LINQ removed in office by Dr Rayann Heman  . TEE WITHOUT CARDIOVERSION N/A 02/12/2016   Procedure: TRANSESOPHAGEAL ECHOCARDIOGRAM (TEE);  Surgeon: Jerline Pain, MD;  Location: [facility] ENDOSCOPY;  Service: Cardiovascular;  Laterality: N/A;  . WIRE IN APEX OF RIGHT LUNG     Since childhood    There were no vitals filed for this visit.     SLP Evaluation [facility] - 05/31/20 0001      SLP Visit Information   SLP Received On 05/31/20    Referring Provider (SLP) Antonieta Pert, MD    Onset Date 05/09/2020    Medical Diagnosis SDH      Subjective   Subjective "It is hard to keep track of things sometimes."    Patient/Family Stated Goal Improve memory      Pain Assessment   Currently in Pain? No/denies      General Information   HPI Pt is a 60 y.o. male with medical history significant of CAD s/p DES, CVA, diabetes, hypertension, GERD hyperlipidemia, subdural hematoma (admission 4/13-4/17), carpal tunnel. Pt presented with worsening headache and confusion. Chest x-ray with no acute abnormality and emphysematous changes.  CT head with no acute abnormality and minimal decrease in the size of hematomas. He was referred for OP SLP services by Dr. Antonieta Pert following discharge from acute stay for SDH.    Behavioral/Cognition alert and cooperative    Mobility Status ambulatory      Balance Screen  Has the patient fallen in the past 6 months No    Has the patient had a decrease in activity level because of a fear of falling?  No    Is the patient reluctant to leave their home because of a fear of falling?  No      Prior Functional Status   Cognitive/Linguistic Baseline Baseline deficits    Baseline deficit details Prior CVA with memory impairment    Type of Home House     Lives With Spouse    Available Support Family    Education high school    Vocation Full time employment      Pain Assessment   Pain Assessment No/denies pain       Cognition   Overall Cognitive Status Impaired/Different from baseline    Area of Impairment Memory;Attention    Current Attention Level Sustained    Attention Comments 3 digits in reverse    Memory Decreased short-term memory    Memory Comments Pt reports difficulty managing appointments    Memory Impaired    Memory Impairment Prospective memory;Decreased short term memory    Decreased Short Term Memory Functional complex;Verbal complex    Awareness Appears intact    Problem Solving Appears intact    Executive Function Organizing    Organizing Impaired    Organizing Impairment Functional complex;Verbal complex      Auditory Comprehension   Overall Auditory Comprehension Appears within functional limits for tasks assessed    Yes/No Questions Within Functional Limits    Commands Within Functional Limits    Conversation Moderately complex    Interfering Components Attention;Working Engineer, technical sales Within [employer]      Reading Comprehension   Reading Status Not tested      Expression   Primary Mode of Expression Verbal      Verbal Expression   Overall Verbal Expression Appears within functional limits for tasks assessed    Initiation No impairment    Automatic Speech Name;Social Response    Level of Generative/Spontaneous Verbalization Conversation    Repetition No impairment    Naming No impairment    Responsive 76-100% accurate    Convergent Not tested    Pragmatics No impairment    Non-Verbal Means of Communication Not applicable      Written Expression   Dominant Hand Right    Written Expression Not tested      Oral Motor/Sensory Function   Overall Oral Motor/Sensory Function Appears within functional limits for tasks assessed      Motor Speech   Overall Motor Speech Appears within functional limits for tasks assessed    Respiration Within functional  limits    Phonation Normal    Resonance Within functional limits    Articulation Within functional limitis    Intelligibility Intelligible    Motor Planning Witnin functional limits    Motor Speech Errors Not applicable    Phonation [facility]      Standardized Assessments   Standardized Assessments  --   [facility] SLUMS 27/30          05/31/20 1602  SLP Visits / Re-Eval  Visit Number 1  Number of Visits 5  Date for SLP Re-Evaluation 07/02/20  Authorization  Authorization Type BCBS Comm PPO 25% CO-INS NO CO-PAY FAM DED $5,500.00/0 MET 60 VISIT LIMIT/ 0USED OOP MAX $6,850.00/ $376.53 MET FAM OOP MAX $13,700.00/ $522.53 MET NO AUTH REQ  SLP Time Calculation  SLP Start Time 0900  SLP Stop Time  0945  SLP Time Calculation (min) 45 min  SLP - End of Session  Activity Tolerance Patient tolerated treatment well      05/31/20 1604  SLP Assessment/Plan  Clinical Impression Statement Pt presents with mild attentiong and memory deficits which negatively impact complex planning and organization. Pt scored a 27/30 on the [place] which is WNL, however he subjectively reports difficulty managing appointments and remembering information. Pt's aphasia which was present in the hospital has resolved. Pt will benefit from skilled SLP in order to address the above impairments, maximize independence, and decrease burden of care. Recommend short term cognitive linguistic therapy to help manage memory and attention deficits.  Speech Therapy Frequency 2x / week  Duration 2 weeks  Treatment/Interventions Compensatory strategies;Patient/family education;SLP instruction and feedback;Internal/external aids;Cognitive reorganization  Potential to Achieve Goals Good  SLP Home Exercise Plan Pt will complete HEP as assigned to facilitate carryover of treatment strategies and techniques in home environment  Consulted and Agree with Plan of Care Patient      SLP Short Term Goals - 05/31/20 1607      SLP SHORT  TERM GOAL #1   Title Pt will implement memory and attention strategies in functional tasks with min assist from SLP after initial introduction.   Baseline Mod assist   Time 2    Period Weeks    Status New    Target Date 07/02/20      SLP SHORT TERM GOAL #2   Title Pt will identify at least 2 "problem areas" at work related to memory and attention management and state 2+ techniques to address the situation with min assist from SLP.   Baseline Pt is not currently using effective strategies to manage memory deficits at work.   Time 2    Status New    Target Date 07/02/20            SLP Long Term Goals - 05/31/20 1608      SLP LONG TERM GOAL #1   Title Same as short term goals             Patient will benefit from skilled therapeutic intervention in order to improve the following deficits and impairments:   Cognitive communication deficit    Problem List Patient Active Problem List   Diagnosis Date Noted  . Acute metabolic encephalopathy 41/58/3094  . Receptive aphasia   . Subdural hematoma ([place]) 05/09/2020  . Polypharmacy 06/17/2019  . Encounter for screening colonoscopy 06/17/2019  . Vitamin D deficiency 05/10/2019  . Diabetic peripheral neuropathy ([place]) 11/24/2018  . Bilateral carpal tunnel syndrome 11/24/2018  . Acute ischemic stroke ([place]) 03/03/2018  . Uncontrolled type 2 diabetes mellitus with hyperglycemia ([place]) 04/21/2017  . Hyperlipemia 04/21/2016  . Malnutrition of moderate degree 02/11/2016  . History of stroke 02/11/2016  . TIA (transient ischemic attack) 02/09/2016  . Right carotid bruit 02/09/2016  . Personal history of noncompliance with medical treatment, presenting hazards to health 02/09/2016  . Type 2 diabetes mellitus with vascular disease ([place]) 12/12/2014  . Essential hypertension, benign 12/12/2014  . Hyperlipidemia 12/12/2014  . 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 TOBACCO ABUSE 07/26/2008  . CORONARY ATHEROSCLEROSIS NATIVE CORONARY ARTERY  07/26/2008  . GERD 07/25/2008  . Backache 07/25/2008   Thank you,  Genene Churn, [place]  Genene Churn 06/04/2020, 4:08 PM  [facility] [address] [place], [place], [address] Phone: [phone]  Fax:  [phone]  Name: Nicholas [name] MRN: [id number] Date of Birth: 01/28/1961

## 2020-06-05 ENCOUNTER — Encounter (HOSPITAL_COMMUNITY): Payer: Self-pay | Admitting: Speech Pathology

## 2020-06-05 ENCOUNTER — Ambulatory Visit (HOSPITAL_COMMUNITY): Payer: BC Managed Care – PPO | Admitting: Speech Pathology

## 2020-06-05 ENCOUNTER — Other Ambulatory Visit: Payer: Self-pay

## 2020-06-05 ENCOUNTER — Encounter (HOSPITAL_COMMUNITY): Payer: Self-pay

## 2020-06-05 DIAGNOSIS — R2689 Other abnormalities of gait and mobility: Secondary | ICD-10-CM | POA: Diagnosis not present

## 2020-06-05 DIAGNOSIS — R41841 Cognitive communication deficit: Secondary | ICD-10-CM

## 2020-06-05 NOTE — Progress Notes (Signed)
I have contacted this patient to scheduled LDCT scan. LDCT scan scheduled for 06/09 at 0900. Patient aware

## 2020-06-05 NOTE — Therapy (Signed)
[facility] [address] [place], [place], [address] Phone: [phone]   Fax:  [phone]  Speech Language Pathology Treatment  Patient Details  Name: Nicholas [name] MRN: [id number] Date of Birth: 1960/09/01 Referring Provider (SLP): Antonieta Pert, MD   Encounter Date: 06/05/2020   End of Session - 06/05/20 1806    Visit Number 2    Number of Visits 5    Date for SLP Re-Evaluation 07/02/20    Authorization Type BCBS Comm PPO   25% CO-INS  NO CO-PAY  FAM DED $5,500.00/0 MET  60 VISIT LIMIT/ 0USED  OOP MAX $6,850.00/ $376.53 MET  FAM OOP MAX $13,700.00/ $522.53 MET  NO AUTH REQ   SLP Start Time 1430    SLP Stop Time  1515    SLP Time Calculation (min) 45 min    Activity Tolerance Patient tolerated treatment well           Past Medical History:  Diagnosis Date  . Anal lesion    Anal papilla/tags - external lesion  . Bilateral carpal tunnel syndrome 11/24/2018  . Colon polyp    Hyperplastic rectosigmoid polyp 9/08  . Coronary atherosclerosis of native coronary artery    DES to PLB, BMS to mid and distal RCA, DES to circumflex and distal LAD, 6/10 (5 stents)  . Diabetic peripheral neuropathy ([place]) 11/24/2018  . GERD (gastroesophageal reflux disease)   . Helicobacter pylori (H. pylori) 09/2009   Treated with helidac  . Hiatal hernia   . Hyperplastic colon polyp 2008  . Mixed hyperlipidemia   . Myocardial infarction ([place]) 06/2008  . Stroke [facility])    January 2018  . Type 2 diabetes mellitus ([place])     Past Surgical History:  Procedure Laterality Date  . CHOLECYSTECTOMY    . COLLAPSED LUNG     15 years ago  . COLONOSCOPY  10/12/2006   Dr. Gala Romney- rectosigmoid polyp s/phot snare polypectomy o/w normal rectum and terminal ileum. hyperplastic polyp on bx  . COLONOSCOPY WITH PROPOFOL N/A 09/01/2019   Procedure: COLONOSCOPY WITH PROPOFOL;  Surgeon: Daneil Dolin, MD;  [facility];  Service: Endoscopy;  Laterality: N/A;   8:30am  . EP IMPLANTABLE DEVICE N/A 02/12/2016   Procedure: Loop Recorder Insertion;  Surgeon: Thompson Grayer, MD;  Location: [facility] CV LAB;  Service: Cardiovascular;  Laterality: N/A;  . ESOPHAGOGASTRODUODENOSCOPY  10/12/2006   Dr. Gala Romney- examination of the tubular esophagus revealed no mucosal abnormalities. the EG junction was easily traversed. small hiatal hernia, the gastric mucosa o/w appeared normal. there was no infiltrating process or frank ulcer seen.  . implantable loop recorder removal  09/29/2019   MDT Reveal LINQ removed in office by Dr Rayann Heman  . TEE WITHOUT CARDIOVERSION N/A 02/12/2016   Procedure: TRANSESOPHAGEAL ECHOCARDIOGRAM (TEE);  Surgeon: Jerline Pain, MD;  Location: [facility] ENDOSCOPY;  Service: Cardiovascular;  Laterality: N/A;  . WIRE IN APEX OF RIGHT LUNG     Since childhood    There were no vitals filed for this visit.   Subjective Assessment - 06/05/20 1757    Subjective "I have been trying to write more things down now."    Currently in Pain? No/denies           ADULT SLP TREATMENT - 06/05/20 1803      General Information   Behavior/Cognition Alert;Pleasant mood;Cooperative    Patient Positioning Upright in chair    Oral care provided N/A    HPI Pt is a  60 y.o. male with medical history significant of CAD s/p DES, CVA, diabetes, hypertension, GERD hyperlipidemia, subdural hematoma (admission 4/13-4/17), carpal tunnel. Pt presented with worsening headache and confusion. Chest x-ray with no acute abnormality and emphysematous changes.  CT head with no acute abnormality and minimal decrease in the size of hematomas. He was referred for OP SLP services by Dr. Antonieta Pert following discharge from acute stay for SDH.      Treatment Provided   Treatment provided Cognitive-Linquistic      Pain Assessment   Pain Assessment No/denies pain      Cognitive-Linquistic Treatment   Treatment focused on Cognition;Patient/family/caregiver education    Skilled Treatment  functional memory and attention strategies, self evaluation      Assessment / Recommendations / Plan   Plan Continue with current plan of care      Progression Toward Goals   Progression toward goals Progressing toward goals            SLP Education - 06/05/20 1805    Education Details Reviewed memory and attention strategies and information provided after the evaluation    Person(s) Educated Patient    Methods Explanation;Handout    Comprehension Verbalized understanding            SLP Short Term Goals - 06/05/20 1806      SLP SHORT TERM GOAL #1   Title Pt will implement memory and attention strategies in functional tasks with min assist from SLP after initial introduction.    Baseline mod assist    Time 2    Period Weeks    Status On-going    Target Date 07/02/20      SLP SHORT TERM GOAL #2   Title Pt will identify at least 2 "problem areas" at work related to memory and attention management and state 2+ techniques to address the situation with min assist from SLP.    Baseline Pt is not currently using strategies to manage deficits at work    Time 2    Period Weeks    Status On-going    Target Date 07/02/20            SLP Long Term Goals - 05/31/20 1608      SLP LONG TERM GOAL #1   Title Same as short term goals            Plan - 06/05/20 1806    Clinical Impression Statement Pt completed the daily memory assignment for each day since the evaluation. The memory and attention strategies were reviewed with Pt. He identified "problem areas" at work and SLP facilitated possible solutions. Pt plans to use a small notebook, calendar, and his phone to help keep him organized. He was encouraged to sign up for My Chart on his phone to help him keep track of his appointments. Pt acknowledged that he previously tried to fit too many activities/jobs in one day and he is now prioritizing jobs and allowing additional time for completion. Suspect that pt will only need 1-2  more sessions before discharge.    Speech Therapy Frequency 2x / week    Duration 2 weeks    Treatment/Interventions Compensatory strategies;Patient/family education;SLP instruction and feedback;Internal/external aids;Cognitive reorganization    Potential to Achieve Goals Good    SLP Home Exercise Plan Pt will complete HEP as assigned to facilitate carryover of treatment strategies and techniques in home environment    Consulted and Agree with Plan of Care Patient  Patient will benefit from skilled therapeutic intervention in order to improve the following deficits and impairments:   Cognitive communication deficit    Problem List Patient Active Problem List   Diagnosis Date Noted  . Acute metabolic encephalopathy 89/37/3428  . Receptive aphasia   . Subdural hematoma ([place]) 05/09/2020  . Polypharmacy 06/17/2019  . Encounter for screening colonoscopy 06/17/2019  . Vitamin D deficiency 05/10/2019  . Diabetic peripheral neuropathy ([place]) 11/24/2018  . Bilateral carpal tunnel syndrome 11/24/2018  . Acute ischemic stroke ([place]) 03/03/2018  . Uncontrolled type 2 diabetes mellitus with hyperglycemia ([place]) 04/21/2017  . Hyperlipemia 04/21/2016  . Malnutrition of moderate degree 02/11/2016  . History of stroke 02/11/2016  . TIA (transient ischemic attack) 02/09/2016  . Right carotid bruit 02/09/2016  . Personal history of noncompliance with medical treatment, presenting hazards to health 02/09/2016  . Type 2 diabetes mellitus with vascular disease ([place]) 12/12/2014  . Essential hypertension, benign 12/12/2014  . Hyperlipidemia 12/12/2014  . 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 TOBACCO ABUSE 07/26/2008  . CORONARY ATHEROSCLEROSIS NATIVE CORONARY ARTERY 07/26/2008  . GERD 07/25/2008  . Backache 07/25/2008   Thank you,  Genene Churn, [place]  [facility] 06/05/2020, 6:10 PM  [facility] [place], [place], [address] Phone: [phone]   Fax:  [phone]   Name: Nicholas [name] MRN: [id number] Date of Birth: [birth year]

## 2020-06-06 ENCOUNTER — Ambulatory Visit (HOSPITAL_COMMUNITY): Payer: BC Managed Care – PPO | Admitting: Speech Pathology

## 2020-06-07 ENCOUNTER — Ambulatory Visit: Payer: BC Managed Care – PPO | Admitting: Neurology

## 2020-06-11 ENCOUNTER — Ambulatory Visit (HOSPITAL_COMMUNITY): Payer: BC Managed Care – PPO | Admitting: Speech Pathology

## 2020-06-11 ENCOUNTER — Encounter (HOSPITAL_COMMUNITY): Payer: Self-pay | Admitting: Speech Pathology

## 2020-06-11 ENCOUNTER — Other Ambulatory Visit: Payer: Self-pay

## 2020-06-11 DIAGNOSIS — R41841 Cognitive communication deficit: Secondary | ICD-10-CM

## 2020-06-11 DIAGNOSIS — R2689 Other abnormalities of gait and mobility: Secondary | ICD-10-CM | POA: Diagnosis not present

## 2020-06-11 NOTE — Therapy (Signed)
[facility] [address] [place], [place], [address] Phone: [phone]   Fax:  [phone]  Speech Language Pathology Treatment  Patient Details  Name: Nicholas [name] MRN: [id number] Date of Birth: 07-06-1960 Referring Provider (SLP): Antonieta Pert, MD   Encounter Date: 06/11/2020   End of Session - 06/11/20 1231    Visit Number 3    Number of Visits 5    Date for SLP Re-Evaluation 07/02/20    Authorization Type BCBS Comm PPO   25% CO-INS  NO CO-PAY  FAM DED $5,500.00/0 MET  60 VISIT LIMIT/ 0USED  OOP MAX $6,850.00/ $376.53 MET  FAM OOP MAX $13,700.00/ $522.53 MET  NO AUTH REQ   SLP Start Time 0950    SLP Stop Time  1025    SLP Time Calculation (min) 35 min    Activity Tolerance Patient tolerated treatment well           Past Medical History:  Diagnosis Date  . Anal lesion    Anal papilla/tags - external lesion  . Bilateral carpal tunnel syndrome 11/24/2018  . Colon polyp    Hyperplastic rectosigmoid polyp 9/08  . Coronary atherosclerosis of native coronary artery    DES to PLB, BMS to mid and distal RCA, DES to circumflex and distal LAD, 6/10 (5 stents)  . Diabetic peripheral neuropathy ([place]) 11/24/2018  . GERD (gastroesophageal reflux disease)   . Helicobacter pylori (H. pylori) 09/2009   Treated with helidac  . Hiatal hernia   . Hyperplastic colon polyp 2008  . Mixed hyperlipidemia   . Myocardial infarction ([place]) 06/2008  . Stroke [facility])    January 2018  . Type 2 diabetes mellitus ([place])     Past Surgical History:  Procedure Laterality Date  . CHOLECYSTECTOMY    . COLLAPSED LUNG     15 years ago  . COLONOSCOPY  10/12/2006   Dr. Gala Romney- rectosigmoid polyp s/phot snare polypectomy o/w normal rectum and terminal ileum. hyperplastic polyp on bx  . COLONOSCOPY WITH PROPOFOL N/A 09/01/2019   Procedure: COLONOSCOPY WITH PROPOFOL;  Surgeon: Daneil Dolin, MD;  [facility];  Service: Endoscopy;  Laterality: N/A;   8:30am  . EP IMPLANTABLE DEVICE N/A 02/12/2016   Procedure: Loop Recorder Insertion;  Surgeon: Thompson Grayer, MD;  Location: [place] CV LAB;  Service: Cardiovascular;  Laterality: N/A;  . ESOPHAGOGASTRODUODENOSCOPY  10/12/2006   Dr. Gala Romney- examination of the tubular esophagus revealed no mucosal abnormalities. the EG junction was easily traversed. small hiatal hernia, the gastric mucosa o/w appeared normal. there was no infiltrating process or frank ulcer seen.  . implantable loop recorder removal  09/29/2019   MDT Reveal LINQ removed in office by Dr Rayann Heman  . TEE WITHOUT CARDIOVERSION N/A 02/12/2016   Procedure: TRANSESOPHAGEAL ECHOCARDIOGRAM (TEE);  Surgeon: Jerline Pain, MD;  [facility]: [facility] ENDOSCOPY;  Service: Cardiovascular;  Laterality: N/A;  . WIRE IN APEX OF RIGHT LUNG     Since childhood    There were no vitals filed for this visit.   Subjective Assessment - 06/11/20 1229    Subjective "I think I am ready to be finished with therapy."    Patient is accompained by: Family member    Currently in Pain? No/denies            ADULT SLP TREATMENT - 06/11/20 1229      General Information   Behavior/Cognition Alert;Pleasant mood;Cooperative    Patient Positioning Upright in chair    Oral  care provided N/A    HPI Pt is a 60 y.o. male with medical history significant of CAD s/p DES, CVA, diabetes, hypertension, GERD hyperlipidemia, subdural hematoma (admission 4/13-4/17), carpal tunnel. Pt presented with worsening headache and confusion. Chest x-ray with no acute abnormality and emphysematous changes.  CT head with no acute abnormality and minimal decrease in the size of hematomas. He was referred for OP SLP services by Dr. Antonieta Pert following discharge from acute stay for SDH.      Treatment Provided   Treatment provided Cognitive-Linquistic      Pain Assessment   Pain Assessment No/denies pain      Cognitive-Linquistic Treatment   Treatment focused on  Cognition;Patient/family/caregiver education    Skilled Treatment Wife accompanied Pt to session today, previous recommendations were reviewed with Pt and spouse. Pt able to identify effective strategies to compensate for memory impairment.      Assessment / Recommendations / Plan   Plan Discharge SLP treatment due to (comment);All goals met      Progression Toward Goals   Progression toward goals Goals met, education completed, patient discharged from [facility] - 06/11/20 1233      Garden #1   Title Pt will implement memory and attention strategies in functional tasks with min assist from SLP after initial introduction.    Baseline mod assist    Time 2    Period Weeks    Status Achieved    Target Date 07/02/20      SLP SHORT TERM GOAL #2   Title Pt will identify at least 2 "problem areas" at work related to memory and attention management and state 2+ techniques to address the situation with min assist from SLP.    Baseline Pt is not currently using strategies to manage deficits at work    Time 2    Period Weeks    Status Achieved    Target Date 07/02/20            SLP Long Term Goals - 05/31/20 1608      SLP LONG TERM GOAL #1   Title Same as short term goals            Plan - 06/11/20 1232    Clinical Impression Statement Pt reports feeling pleased with his progress and has implemented the strategies discussed during our previous sessions. He expressed that he feels he is ready to be discharged from therapy. His wife accompanied him to therapy today and is in agreement that Pt is ready for discharge. Both feel that his memory deficits were present after his stroke in 2020, but that he never received therapy at that time. He purchased a small notebook to keep with him, is using a calendar, and is delegating certain tasks to people who work for him. Pt is ready for discharge.   Treatment/Interventions Compensatory  strategies;Patient/family education;SLP instruction and feedback;Internal/external aids;Cognitive reorganization    Potential to Achieve Goals Good    SLP Home Exercise Plan Pt will complete HEP as assigned to facilitate carryover of treatment strategies and techniques in home environment    Consulted and Agree with Plan of Care Patient           Patient will benefit from skilled therapeutic intervention in order to improve the following deficits and impairments:   Cognitive communication deficit    Problem List Patient Active Problem List  Diagnosis Date Noted  . Acute metabolic encephalopathy 64/40/3474  . Receptive aphasia   . Subdural hematoma ([place]) 05/09/2020  . Polypharmacy 06/17/2019  . Encounter for screening colonoscopy 06/17/2019  . Vitamin D deficiency 05/10/2019  . Diabetic peripheral neuropathy ([place]) 11/24/2018  . Bilateral carpal tunnel syndrome 11/24/2018  . Acute ischemic stroke ([place]) 03/03/2018  . Uncontrolled type 2 diabetes mellitus with hyperglycemia ([place]) 04/21/2017  . Hyperlipemia 04/21/2016  . Malnutrition of moderate degree 02/11/2016  . History of stroke 02/11/2016  . TIA (transient ischemic attack) 02/09/2016  . Right carotid bruit 02/09/2016  . Personal history of noncompliance with medical treatment, presenting hazards to health 02/09/2016  . Type 2 diabetes mellitus with vascular disease ([place]) 12/12/2014  . Essential hypertension, benign 12/12/2014  . Hyperlipidemia 12/12/2014  . 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 TOBACCO ABUSE 07/26/2008  . CORONARY ATHEROSCLEROSIS NATIVE CORONARY ARTERY 07/26/2008  . GERD 07/25/2008  . Backache 07/25/2008   SPEECH THERAPY DISCHARGE SUMMARY  Visits from Start of Care: 3  Current functional level related to goals / functional outcomes: Goals met   Remaining deficits: Min memory impairment   Education / Equipment: Completed  Plan: Patient agrees to discharge.  Patient goals were met. Patient is  being discharged due to meeting the stated rehab goals.  ?????         Thank you,  Genene Churn, [place]  [facility] 06/11/2020, 12:34 PM  [facility] [address] [place], [place], [address] Phone: [phone]   Fax:  [phone]   Name: Nicholas [name] MRN: [id number] Date of Birth: 1960/11/04

## 2020-06-13 ENCOUNTER — Encounter (HOSPITAL_COMMUNITY): Payer: Self-pay | Admitting: Speech Pathology

## 2020-06-13 ENCOUNTER — Ambulatory Visit (HOSPITAL_COMMUNITY): Payer: BC Managed Care – PPO | Admitting: Speech Pathology

## 2020-06-16 ENCOUNTER — Other Ambulatory Visit: Payer: Self-pay | Admitting: "Endocrinology

## 2020-07-04 ENCOUNTER — Ambulatory Visit (INDEPENDENT_AMBULATORY_CARE_PROVIDER_SITE_OTHER): Payer: BC Managed Care – PPO | Admitting: Neurology

## 2020-07-04 ENCOUNTER — Encounter: Payer: Self-pay | Admitting: Neurology

## 2020-07-04 VITALS — BP 124/70 | HR 82 | Ht 69.0 in | Wt 163.2 lb

## 2020-07-04 DIAGNOSIS — I6200 Nontraumatic subdural hemorrhage, unspecified: Secondary | ICD-10-CM

## 2020-07-04 DIAGNOSIS — Z8673 Personal history of transient ischemic attack (TIA), and cerebral infarction without residual deficits: Secondary | ICD-10-CM

## 2020-07-04 MED ORDER — LACOSAMIDE 50 MG PO TABS: 50.0000 mg | ORAL_TABLET | Freq: Two times a day (BID) | ORAL | 1 refills | Status: DC

## 2020-07-04 NOTE — Progress Notes (Signed)
Cardiology Office Note    Date:  07/05/2020   ID:  SHAHIN KNIERIM, DOB [birth year], MRN [id number]  PCP:  Sharilyn Sites, MD  Cardiologist: Rozann Lesches, MD    Chief Complaint  Patient presents with   Hospitalization Follow-up    History of Present Illness:    Nicholas [name] is a 60 y.o. male with past medical history of CAD (s/p prior stenting to PLB, RCA, LCx, and LAD with most recent intervention in 2010, low-risk NST in 06/2014), HTN, HLD, Type 2 DM, prior CVA and tobacco use who presents to the office today for hospital follow-up.  He was last examined by Dr. Rayann Heman in 09/2019 and denied any recent cardiac symptoms at that time. His ILR was at EOS and demonstrated no evidence of atrial fibrillation. This was therefore removed during his visit.  By review of notes, he was hospitalized at [facility] in 04/2020 for evaluation of right-sided vision loss and confusion and was found to have small bilateral subdural hematomas. Neurology evaluated the patient and he was started on Keppra and Gabapentin. Neurosurgery did not recommend any surgical intervention during admission. Cardiology was contacted as Neurology recommended stopping Plavix and continuing ASA 81 mg daily.  He did present back to the ED on 05/21/2020 for evaluation of a worsening headache and repeat CT Head showed no acute intracranial abnormalities and his chronic subdural collections had slightly decreased since prior imaging.  In talking with the patient today, he reports overall doing well since his recent hospitalization. He did work with speech therapy and says his dysphagia has significantly improved. He has been trying to gradually increase his physical activity around the house. He still experiences intermittent headaches but says these have improved. Denies any recent chest pain or dyspnea on exertion.  No recent orthopnea, PND or pitting edema.  He was previously smoking 2 ppd and has reduced his use to less than 1  ppd.   Past Medical History:  Diagnosis Date   Anal lesion    Anal papilla/tags - external lesion   Bilateral carpal tunnel syndrome 11/24/2018   Colon polyp    Hyperplastic rectosigmoid polyp 9/08   Coronary atherosclerosis of native coronary artery    DES to PLB, BMS to mid and distal RCA, DES to circumflex and distal LAD, 6/10 (5 stents)   Diabetic peripheral neuropathy ([place]) 11/24/2018   GERD (gastroesophageal reflux disease)    Helicobacter pylori (H. pylori) 09/2009   Treated with helidac   Hiatal hernia    Hyperplastic colon polyp 2008   Mixed hyperlipidemia    Myocardial infarction ([place]) 06/2008   Seizures ([place])    Stroke [facility])    January 2018   Type 2 diabetes mellitus [facility])     Past Surgical History:  Procedure Laterality Date   CHOLECYSTECTOMY     COLLAPSED LUNG     15 years ago   COLONOSCOPY  10/12/2006   Dr. Gala Romney- rectosigmoid polyp s/phot snare polypectomy o/w normal rectum and terminal ileum. hyperplastic polyp on bx   COLONOSCOPY WITH PROPOFOL N/A 09/01/2019   Procedure: COLONOSCOPY WITH PROPOFOL;  Surgeon: Daneil Dolin, MD;  [facility];  Service: Endoscopy;  Laterality: N/A;  8:30am   EP IMPLANTABLE DEVICE N/A 02/12/2016   Procedure: Loop Recorder Insertion;  Surgeon: Thompson Grayer, MD;  Location: [place] CV LAB;  Service: Cardiovascular;  Laterality: N/A;   ESOPHAGOGASTRODUODENOSCOPY  10/12/2006   Dr. Gala Romney- examination of the tubular esophagus revealed no mucosal abnormalities.  the EG junction was easily traversed. small hiatal hernia, the gastric mucosa o/w appeared normal. there was no infiltrating process or frank ulcer seen.   implantable loop recorder removal  09/29/2019   MDT Reveal LINQ removed in office by Dr Rayann Heman   TEE WITHOUT CARDIOVERSION N/A 02/12/2016   Procedure: TRANSESOPHAGEAL ECHOCARDIOGRAM (TEE);  Surgeon: Jerline Pain, MD;  Location: [facility] ENDOSCOPY;  Service: Cardiovascular;  Laterality: N/A;   WIRE IN APEX OF RIGHT LUNG      Since childhood    Current Medications: Outpatient Medications Prior to Visit  Medication Sig Dispense Refill   acetaminophen (TYLENOL) 500 MG tablet Take 500 mg by mouth every 6 (six) hours as needed (for headaches).     albuterol (VENTOLIN HFA) 108 (90 Base) MCG/ACT inhaler Inhale 1-2 puffs into the lungs every 6 (six) hours as needed for wheezing or shortness of breath.     aspirin EC 81 MG EC tablet Take 1 tablet (81 mg total) by mouth daily. Swallow whole. 30 tablet 11   BD PEN NEEDLE NANO U/F 32G X 4 MM MISC USE AS DIRECTED FOUR TIMES DAILY. 100 each 2   Continuous Blood Gluc Receiver (FREESTYLE LIBRE 2 READER) DEVI As directed 1 each 0   gabapentin (NEURONTIN) 100 MG capsule Take 100 mg by mouth daily.      lacosamide (VIMPAT) 50 MG TABS tablet Take 1 tablet (50 mg total) by mouth 2 (two) times daily. 180 tablet 1   LANTUS SOLOSTAR 100 UNIT/ML Solostar Pen INJECT 40 UNITS SUBCUTANEOUSLY ONCE DAILY (Patient taking differently: 30 Units.) 15 mL 3   nitroGLYCERIN (NITROSTAT) 0.4 MG SL tablet DISSOLVE 1 TABLET UNDER TONGUE EVERY 5 MINUTES UP TO 15 MIN FOR CHESTPAIN. IF NO RELIEF CALL 911. (Patient taking differently: Place 0.4 mg under the tongue every 5 (five) minutes as needed for chest pain.) 25 tablet 3   pantoprazole (PROTONIX) 40 MG tablet Take 40 mg by mouth daily before breakfast.     traMADol (ULTRAM) 50 MG tablet Take 1 tablet (50 mg total) by mouth every 6 (six) hours as needed for moderate pain. (Patient taking differently: Take 50 mg by mouth daily.) 15 tablet 0   No facility-administered medications prior to visit.     Allergies:   Patient has no known allergies.   Social History   Socioeconomic History   Marital status: Married    Spouse name: Not on file   Number of children: Not on file   Years of education: Not on file   Highest education level: Not on file  Occupational History   Occupation: maintenance, parking lots    Employer: SELF-EMPLOYED  Tobacco Use    Smoking status: Every Day    Packs/day: 0.50    Years: 34.00    Pack years: 17.00    Types: Cigarettes    Start date: 04/08/1973   Smokeless tobacco: Never  Vaping Use   Vaping Use: Never used  Substance and Sexual Activity   Alcohol use: No    Alcohol/week: 0.0 standard drinks   Drug use: No   Sexual activity: Yes    Partners: Female    Birth control/protection: Pill  Other Topics Concern   Not on file  Social History Narrative   Drinks Coffee 2 cups daily.  Lives sat home with wife.   Is self employed.  Graduated from High school.     Social Determinants of Health   Financial Resource Strain: Not on file  Food Insecurity: Not on  file  Transportation Needs: Not on file  Physical Activity: Not on file  Stress: Not on file  Social Connections: Not on file     Family History:  The patient's family history includes Cancer in his brother; Diabetes in his mother; Heart disease in his father.   Review of Systems:    Please see the history of present illness.     All other systems reviewed and are otherwise negative except as noted above.   Physical Exam:    VS:  BP 114/82   Pulse 92   Ht 5\' 9"  (1.753 m)   Wt 165 lb (74.8 kg)   SpO2 96%   BMI 24.37 kg/m    General: Well developed, well nourished,male appearing in no acute distress. Head: Normocephalic, atraumatic. Neck: No carotid bruits. JVD not elevated.  Lungs: Respirations regular and unlabored, without wheezes or rales.  Heart: Regular rate and rhythm. No S3 or S4.  No murmur, no rubs, or gallops appreciated. Abdomen: Appears non-distended. No obvious abdominal masses. Msk:  Strength and tone appear normal for age. No obvious joint deformities or effusions. Extremities: No clubbing or cyanosis. No lower extremity edema.  Distal pedal pulses are 2+ bilaterally. Neuro: Alert and oriented X 3. Moves all extremities spontaneously. No focal deficits noted. Psych:  Responds to questions appropriately with a normal  affect. Skin: No rashes or lesions noted  Wt Readings from Last 3 Encounters:  07/05/20 165 lb (74.8 kg)  07/04/20 163 lb 3.2 oz (74 kg)  05/21/20 160 lb (72.6 kg)     Studies/Labs Reviewed:   EKG:  EKG is not ordered today.    Recent Labs: 05/23/2020: ALT 16; B Natriuretic Peptide 116.5; BUN 15; Creatinine, Ser 0.84; Hemoglobin 14.7; Magnesium 2.0; Platelets 227; Potassium 4.0; Sodium 138   Lipid Panel    Component Value Date/Time   CHOL 138 05/11/2020 0224   TRIG 98 05/11/2020 0224   HDL 28 (L) 05/11/2020 0224   CHOLHDL 4.9 05/11/2020 0224   VLDL 20 05/11/2020 0224   LDLCALC 90 05/11/2020 0224   LDLCALC 88 09/02/2018 0735    Additional studies/ records that were reviewed today include:   Echocardiogram: 02/2018 IMPRESSIONS     1. The left ventricle has normal systolic function of 74-08%. The cavity  size was normal. There is no increased left ventricular wall thickness.  Echo evidence of normal diastolic relaxation.   2. No evidence of left ventricular regional wall motion abnormalities.   3. The right ventricle has normal systolic function. The cavity was  normal. There is no increase in right ventricular wall thickness. Right  ventricular systolic pressure could not be assessed.   4. The aortic valve is tricuspid. There is moderate calcification of the  aortic valve. There is mild to moderate aortic annular calcification  noted.   5. The mitral valve is normal in structure. There is mild calcification.   6. The tricuspid valve is normal in structure.   7. The aortic root is normal in size and structure.   Assessment:    1. Coronary artery disease involving native coronary artery of native heart without angina pectoris   2. Essential hypertension   3. Hyperlipidemia LDL goal <70   4. Subdural hematoma ([place])   5. TOBACCO ABUSE      Plan:   In order of problems listed above:  1. CAD - He is s/p prior stenting to PLB, RCA, LCx, and LAD with most recent  intervention in 2010. His last  ischemic evaluation was a low-risk NST in 06/2014. - His activity was recently limited in the setting of subdural hematomas but he is gradually starting to become more active.  He denies any recent anginal symptoms. - Continue ASA 81 mg daily and will restart statin therapy as outlined below. Plavix has been held given his recent diagnosis of subdural hematomas and no recent cardiac intervention.   2. HTN - His blood pressure is well controlled at 114/82 during today's visit.  He is currently not on any antihypertensive medications. Will continue to follow.  3. HLD - He was previously listed as taking Atorvastatin but says he has not taken this regularly at any point given myalgias with the medication. We reviewed options and he is open to trying a different medication. Therefore, will start Crestor 5 mg daily and recheck FLP and LFT's in 2 months.   4. Subdural Hematoma - He was hospitalized for bilateral subdural hematomas in 04/2020 and reports significant improvement in his neurological symptoms since. He is scheduled for a repeat Head CT next week per Neurology.  5. Tobacco Use - He has reduced his tobacco use from 2 ppd to less than 1 ppd. He was congratulated on his reduction with cessation advised.   Medication Adjustments/Labs and Tests Ordered: Current medicines are reviewed at length with the patient today.  Concerns regarding medicines are outlined above.  Medication changes, Labs and Tests ordered today are listed in the Patient Instructions below. Patient Instructions  Medication Instructions:   Start Crestor 5mg  daily.   Labwork:  Fasting Lipid Panel and LFT's in 2 months.   Testing/Procedures:  None  Follow-Up:  With Dr. Domenic Polite in 6 months.   Any Other Special Instructions Will Be Listed Below (If Applicable).   If you need a refill on your cardiac medications before your next appointment, please call your pharmacy.    Signed, Erma Heritage, PA-C  07/05/2020 4:59 PM    [facility] [address] [place], [place] [address] Phone: [phone] Fax: [phone]

## 2020-07-04 NOTE — Progress Notes (Signed)
Reason for visit: Possible seizures, subdural hematoma  Referring physician: [facility]  Nicholas [name] is a 60 y.o. male  History of present illness:  Nicholas [name] is a 60 year old right-handed white male with a history of cerebrovascular disease with a prior small left occipital stroke in the past.  The patient has history of coronary artery disease, diabetes, and ongoing tobacco abuse.  He continues to smoke a pack of cigarettes daily.  He has gastroesophageal reflux disease.  The patient was admitted to the hospital on 09 May 2020 with a visual disturbance, headache, and some confusion/aphasia.  The patient reported that in February 2022 that he bumped his head when he fell and hit the left frontotemporal area of his head on a tree.  The patient did have some minor headaches afterwards but this was not an issue for him.  In the third week of March 2022, he went skeet shooting and noted that when he fired a shotgun that he would get an uncomfortable sensation in the head, if he did not use this shotgun, the sensation did not occur.  On the April 13 admission, he presented with some tunnel vision issues, he had visual disturbance on both visual fields in the periphery and had preservation of center vision.  He had a severe headache, and he also appeared to be confused, he was trying to talk but had garbled speech that was not understandable.  When he went to the hospital, CT and MRI evaluation demonstrated bilateral small subdural hematoma.  No stroke was noted.  The patient was taken off of Plavix but kept on aspirin.  He was transiently treated with Keppra but was taken off the medication upon discharge.  The patient returned to the hospital on 21 May 2020 with some headache and slight confusion but no visual disturbance.  The patient at that point was placed on Vimpat and has remained on 50 mg twice daily.  The patient has had some minor headaches since coming out of the hospital but no severe  headaches or visual disturbance or aphasia has been noted.  The patient reported no numbness or weakness of the face, arms, or legs.  He denied any gait disturbance or difficulty controlling the bowels or the bladder.  He does have some minor neck discomfort.  The patient comes to this office for further evaluation.  He remains on aspirin, off the Plavix.  He has not been operating a motor vehicle.  Past Medical History:  Diagnosis Date  . Anal lesion    Anal papilla/tags - external lesion  . Bilateral carpal tunnel syndrome 11/24/2018  . Colon polyp    Hyperplastic rectosigmoid polyp 9/08  . Coronary atherosclerosis of native coronary artery    DES to PLB, BMS to mid and distal RCA, DES to circumflex and distal LAD, 6/10 (5 stents)  . Diabetic peripheral neuropathy ([place]) 11/24/2018  . GERD (gastroesophageal reflux disease)   . Helicobacter pylori (H. pylori) 09/2009   Treated with helidac  . Hiatal hernia   . Hyperplastic colon polyp 2008  . Mixed hyperlipidemia   . Myocardial infarction ([place]) 06/2008  . Stroke [facility])    January 2018  . Type 2 diabetes mellitus ([place])     Past Surgical History:  Procedure Laterality Date  . CHOLECYSTECTOMY    . COLLAPSED LUNG     15 years ago  . COLONOSCOPY  10/12/2006   Dr. Gala Romney- rectosigmoid polyp s/phot snare polypectomy o/w normal rectum and terminal ileum.  hyperplastic polyp on bx  . COLONOSCOPY WITH PROPOFOL N/A 09/01/2019   Procedure: COLONOSCOPY WITH PROPOFOL;  Surgeon: Daneil Dolin, MD;  [facility];  Service: Endoscopy;  Laterality: N/A;  8:30am  . EP IMPLANTABLE DEVICE N/A 02/12/2016   Procedure: Loop Recorder Insertion;  Surgeon: Thompson Grayer, MD;  Location: [place] CV LAB;  Service: Cardiovascular;  Laterality: N/A;  . ESOPHAGOGASTRODUODENOSCOPY  10/12/2006   Dr. Gala Romney- examination of the tubular esophagus revealed no mucosal abnormalities. the EG junction was easily traversed. small hiatal hernia, the gastric mucosa o/w  appeared normal. there was no infiltrating process or frank ulcer seen.  . implantable loop recorder removal  09/29/2019   MDT Reveal LINQ removed in office by Dr Rayann Heman  . TEE WITHOUT CARDIOVERSION N/A 02/12/2016   Procedure: TRANSESOPHAGEAL ECHOCARDIOGRAM (TEE);  Surgeon: Jerline Pain, MD;  Location: [facility] ENDOSCOPY;  Service: Cardiovascular;  Laterality: N/A;  . WIRE IN APEX OF RIGHT LUNG     Since childhood    Family History  Problem Relation Age of Onset  . Diabetes Mother   . Heart disease Father   . Cancer Brother   . CVA Neg Hx     Social history:  reports that he has been smoking cigarettes. He started smoking about 47 years ago. He has a 17.00 pack-year smoking history. He has never used smokeless tobacco. He reports that he does not drink alcohol and does not use drugs.  Medications:  Prior to Admission medications   Medication Sig Start Date End Date Taking? Authorizing Provider  acetaminophen (TYLENOL) 500 MG tablet Take 500 mg by mouth every 6 (six) hours as needed (for headaches).   Yes [provider]  albuterol (VENTOLIN HFA) 108 (90 Base) MCG/ACT inhaler Inhale 1-2 puffs into the lungs every 6 (six) hours as needed for wheezing or shortness of breath.   Yes [provider]  aspirin EC 81 MG EC tablet Take 1 tablet (81 mg total) by mouth daily. Swallow whole. 05/12/20  Yes George Hugh, MD  BD PEN NEEDLE NANO U/F 32G X 4 MM MISC USE AS DIRECTED FOUR TIMES DAILY. 08/31/17  Yes Nida, Marella Chimes, MD  Continuous Blood Gluc Receiver (FREESTYLE LIBRE 2 READER) DEVI As directed 12/16/19  Yes Nida, Marella Chimes, MD  Continuous Blood Gluc Sensor (FREESTYLE LIBRE 2 SENSOR) MISC 1 Piece by Does not apply route every 14 (fourteen) days. Patient taking differently: Inject 1 Piece into the skin every 14 (fourteen) days. 05/08/20  Yes Nida, Marella Chimes, MD  gabapentin (NEURONTIN) 100 MG capsule Take 100 mg by mouth daily.  01/29/19  Yes [provider]   lacosamide (VIMPAT) 50 MG TABS tablet Take 1 tablet (50 mg total) by mouth 2 (two) times daily. 05/23/20  Yes Thurnell Lose, MD  LANTUS SOLOSTAR 100 UNIT/ML Solostar Pen INJECT 40 UNITS SUBCUTANEOUSLY ONCE DAILY Patient taking differently: 30 Units. 06/18/20  Yes Nida, Marella Chimes, MD  nitroGLYCERIN (NITROSTAT) 0.4 MG SL tablet DISSOLVE 1 TABLET UNDER TONGUE EVERY 5 MINUTES UP TO 15 MIN FOR CHESTPAIN. IF NO RELIEF CALL 911. Patient taking differently: Place 0.4 mg under the tongue every 5 (five) minutes as needed for chest pain. 05/17/19  Yes Satira Sark, MD  pantoprazole (PROTONIX) 40 MG tablet Take 40 mg by mouth daily before breakfast. 04/11/10  Yes [provider]  traMADol (ULTRAM) 50 MG tablet Take 1 tablet (50 mg total) by mouth every 6 (six) hours as needed for moderate pain. Patient  taking differently: Take 50 mg by mouth daily. 02/12/16  Yes Vann, Jessica U, DO  ondansetron (ZOFRAN-ODT) 8 MG disintegrating tablet Take 8 mg by mouth every 8 (eight) hours as needed for nausea or vomiting (dissolve orally). Patient not taking: Reported on 07/04/2020    [provider]     No Known Allergies  ROS:  Out of a complete 14 system review of symptoms, the patient complains only of the following symptoms, and all other reviewed systems are negative.  Headache Tunnel vision Confusion  Blood pressure 124/70, pulse 82, height 5\' 9"  (1.753 m), weight 163 lb 3.2 oz (74 kg).  Physical Exam  General: The patient is alert and cooperative at the time of the examination.  Eyes: Pupils are equal, round, and reactive to light. Discs are flat bilaterally.  Neck: The neck is supple, no carotid bruits are noted.  Respiratory: The respiratory examination is clear.  Cardiovascular: The cardiovascular examination reveals a regular rate and rhythm, no obvious murmurs or rubs are noted.  Skin: Extremities are without significant edema.  Neurologic Exam  Mental status:  The patient is alert and oriented x 3 at the time of the examination. The patient has apparent normal recent and remote memory, with an apparently normal attention span and concentration ability.  Cranial nerves: Facial symmetry is present. There is good sensation of the face to pinprick and soft touch bilaterally. The strength of the facial muscles and the muscles to head turning and shoulder shrug are normal bilaterally. Speech is well enunciated, no aphasia or dysarthria is noted. Extraocular movements are full. Visual fields are full. The tongue is midline, and the patient has symmetric elevation of the soft palate. No obvious hearing deficits are noted.  Motor: The motor testing reveals 5 over 5 strength of all 4 extremities. Good symmetric motor tone is noted throughout.  Sensory: Sensory testing is intact to pinprick, soft touch, vibration sensation, and position sense on all 4 extremities. No evidence of extinction is noted.  Coordination: Cerebellar testing reveals good finger-nose-finger bilaterally.  The patient has mild dysmetria with heel-to-shin bilaterally, right greater than left.  Gait and station: Gait is normal. Tandem gait is slightly unsteady. Romberg is negative. No drift is seen.  Reflexes: Deep tendon reflexes are symmetric and normal bilaterally, with exception of depression of the ankle jerk reflexes bilaterally. Toes are downgoing bilaterally.   MRI brain 05/10/20:  IMPRESSION: Negative for acute infarct  Small bilateral extra-axial fluid collection compatible with chronic subdural hematoma.  * MRI scan images were reviewed online. I agree with the written report.   EEG 05/10/20:  IMPRESSION: This study is suggestive of cortical dysfunction arising from left temporo-parietal region, likely secondary to underlying structural abnormality.  No seizures or epileptiform discharges were seen throughout the recording.   Assessment/Plan:  1.  Bilateral subdural  hematoma  2.  Episode of confusion, headache, visual disturbance, seizure versus migraine  3.  Ongoing tobacco abuse  4.  Diabetic peripheral neuropathy  The patient reports an episode of confusion, aphasia, headache, and tunnel vision.  He had a visual disturbance that affected both visual fields, this makes the possibility of a TIA/seizure a bit unlikely.  The patient may have had a migraine headache event.  At any rate, the patient is to remain on the Vimpat for now, we will recheck a CT scan of the brain.  If good resolution of the subdural is noted, he may go back on Plavix and could return to driving.  The patient will follow-up in 4 months.  We will consider a taper off of the Vimpat around that time if he continues to do well.  A prescription for the Vimpat was sent in.  Jill Alexanders MD 07/04/2020 7:15 AM  [facility] [address] [place], [place] [address]  Phone [phone] Fax [phone]

## 2020-07-05 ENCOUNTER — Other Ambulatory Visit: Payer: Self-pay | Admitting: Neurology

## 2020-07-05 ENCOUNTER — Ambulatory Visit: Payer: BC Managed Care – PPO | Admitting: Student

## 2020-07-05 ENCOUNTER — Ambulatory Visit (HOSPITAL_COMMUNITY)
Admission: RE | Admit: 2020-07-05 | Discharge: 2020-07-05 | Disposition: A | Discharge status: Home / Self Care | Payer: BC Managed Care – PPO | Source: Ambulatory Visit | Attending: Hematology | Admitting: Hematology

## 2020-07-05 ENCOUNTER — Encounter: Payer: Self-pay | Admitting: Student

## 2020-07-05 ENCOUNTER — Other Ambulatory Visit: Payer: Self-pay

## 2020-07-05 VITALS — BP 114/82 | HR 92 | Ht 69.0 in | Wt 165.0 lb

## 2020-07-05 DIAGNOSIS — E785 Hyperlipidemia, unspecified: Secondary | ICD-10-CM

## 2020-07-05 DIAGNOSIS — Z87891 Personal history of nicotine dependence: Secondary | ICD-10-CM | POA: Diagnosis present

## 2020-07-05 DIAGNOSIS — I10 Essential (primary) hypertension: Secondary | ICD-10-CM

## 2020-07-05 DIAGNOSIS — Z122 Encounter for screening for malignant neoplasm of respiratory organs: Secondary | ICD-10-CM | POA: Diagnosis present

## 2020-07-05 DIAGNOSIS — I6200 Nontraumatic subdural hemorrhage, unspecified: Secondary | ICD-10-CM

## 2020-07-05 DIAGNOSIS — I2510 Atherosclerotic heart disease of native coronary artery without angina pectoris: Secondary | ICD-10-CM

## 2020-07-05 DIAGNOSIS — S065XAA Traumatic subdural hemorrhage with loss of consciousness status unknown, initial encounter: Secondary | ICD-10-CM

## 2020-07-05 DIAGNOSIS — S065X9A Traumatic subdural hemorrhage with loss of consciousness of unspecified duration, initial encounter: Secondary | ICD-10-CM

## 2020-07-05 DIAGNOSIS — F17200 Nicotine dependence, unspecified, uncomplicated: Secondary | ICD-10-CM

## 2020-07-05 MED ORDER — ROSUVASTATIN CALCIUM 5 MG PO TABS: 5.0000 mg | ORAL_TABLET | Freq: Every day | ORAL | 5 refills | Status: DC

## 2020-07-05 NOTE — Patient Instructions (Addendum)
Medication Instructions:   Start Crestor 5mg  daily.   Labwork:  Fasting Lipid Panel and LFT's in 2 months.   Testing/Procedures:  None  Follow-Up:  With Dr. Domenic Polite in 6 months.   Any Other Special Instructions Will Be Listed Below (If Applicable).     If you need a refill on your cardiac medications before your next appointment, please call your pharmacy.

## 2020-07-10 ENCOUNTER — Encounter (HOSPITAL_COMMUNITY): Payer: Self-pay

## 2020-07-10 NOTE — Progress Notes (Signed)
Patient notified of LDCT Lung Cancer Screening Results via mail with the recommendation to follow-up in 12 months. Patient's referring provider has been sent a copy of results. Results are as follows:  IMPRESSION: 1. Lung-RADS 2, benign appearance or behavior. Continue annual screening with low-dose chest CT without contrast in 12 months. 2. Aortic Atherosclerosis (ICD10-I70.0) and Emphysema (ICD10-J43.9).

## 2020-07-12 ENCOUNTER — Ambulatory Visit
Admission: RE | Admit: 2020-07-12 | Discharge: 2020-07-12 | Disposition: A | Discharge status: Home / Self Care | Payer: BC Managed Care – PPO | Source: Ambulatory Visit | Attending: Neurology | Admitting: Neurology

## 2020-07-12 DIAGNOSIS — I6200 Nontraumatic subdural hemorrhage, unspecified: Secondary | ICD-10-CM

## 2020-07-15 ENCOUNTER — Telehealth: Payer: Self-pay | Admitting: Neurology

## 2020-07-15 NOTE — Telephone Encounter (Signed)
I called the patient.  CT of the head shows continued improvement in chronic subdural hematomas.  The patient will follow up in October 2022.   CT head 07/14/20:  IMPRESSION: Abnormal CT scan of the head without contrast showing bilateral convexities chronic subdural hematomas which appears slightly diminished in size compared with CT head dated 05/21/2020.  There are no acute abnormalities noted.

## 2020-07-24 ENCOUNTER — Other Ambulatory Visit: Payer: Self-pay

## 2020-07-24 ENCOUNTER — Emergency Department (HOSPITAL_COMMUNITY): Payer: BC Managed Care – PPO

## 2020-07-24 ENCOUNTER — Encounter (HOSPITAL_COMMUNITY): Payer: Self-pay

## 2020-07-24 ENCOUNTER — Telehealth: Payer: Self-pay | Admitting: Neurology

## 2020-07-24 ENCOUNTER — Emergency Department (HOSPITAL_COMMUNITY)
Admission: EM | Admit: 2020-07-24 | Discharge: 2020-07-25 | Disposition: A | Discharge status: Home / Self Care | Payer: BC Managed Care – PPO | Attending: Emergency Medicine | Admitting: Emergency Medicine

## 2020-07-24 DIAGNOSIS — Z79899 Other long term (current) drug therapy: Secondary | ICD-10-CM | POA: Diagnosis not present

## 2020-07-24 DIAGNOSIS — Z7982 Long term (current) use of aspirin: Secondary | ICD-10-CM | POA: Insufficient documentation

## 2020-07-24 DIAGNOSIS — I2510 Atherosclerotic heart disease of native coronary artery without angina pectoris: Secondary | ICD-10-CM | POA: Insufficient documentation

## 2020-07-24 DIAGNOSIS — R519 Headache, unspecified: Secondary | ICD-10-CM | POA: Diagnosis present

## 2020-07-24 DIAGNOSIS — I10 Essential (primary) hypertension: Secondary | ICD-10-CM | POA: Diagnosis not present

## 2020-07-24 DIAGNOSIS — E1140 Type 2 diabetes mellitus with diabetic neuropathy, unspecified: Secondary | ICD-10-CM | POA: Diagnosis not present

## 2020-07-24 DIAGNOSIS — F17210 Nicotine dependence, cigarettes, uncomplicated: Secondary | ICD-10-CM | POA: Diagnosis not present

## 2020-07-24 DIAGNOSIS — R4182 Altered mental status, unspecified: Secondary | ICD-10-CM | POA: Insufficient documentation

## 2020-07-24 DIAGNOSIS — Z8679 Personal history of other diseases of the circulatory system: Secondary | ICD-10-CM

## 2020-07-24 LAB — CBC
HCT: 47.5 % (ref 39.0–52.0)
Hemoglobin: 15.5 g/dL (ref 13.0–17.0)
MCH: 27.4 pg (ref 26.0–34.0)
MCHC: 32.6 g/dL (ref 30.0–36.0)
MCV: 83.9 fL (ref 80.0–100.0)
Platelets: 175 10*3/uL (ref 150–400)
RBC: 5.66 MIL/uL (ref 4.22–5.81)
RDW: 13.7 % (ref 11.5–15.5)
WBC: 12.2 10*3/uL — ABNORMAL HIGH (ref 4.0–10.5)
nRBC: 0 % (ref 0.0–0.2)

## 2020-07-24 LAB — COMPREHENSIVE METABOLIC PANEL
ALT: 17 U/L (ref 0–44)
AST: 17 U/L (ref 15–41)
Albumin: 4.2 g/dL (ref 3.5–5.0)
Alkaline Phosphatase: 75 U/L (ref 38–126)
Anion gap: 9 (ref 5–15)
BUN: 14 mg/dL (ref 8–23)
CO2: 26 mmol/L (ref 22–32)
Calcium: 9.2 mg/dL (ref 8.9–10.3)
Chloride: 100 mmol/L (ref 98–111)
Creatinine, Ser: 0.85 mg/dL (ref 0.61–1.24)
GFR, Estimated: >60 mL/min (ref >60)
Glucose, Bld: 207 mg/dL — ABNORMAL HIGH (ref 70–99)
Potassium: 4.5 mmol/L (ref 3.5–5.1)
Sodium: 135 mmol/L (ref 135–145)
Total Bilirubin: 0.6 mg/dL (ref 0.3–1.2)
Total Protein: 7.6 g/dL (ref 6.5–8.1)

## 2020-07-24 MED ORDER — DEXAMETHASONE SODIUM PHOSPHATE 10 MG/ML IJ SOLN
10.0000 mg | Freq: Once | INTRAMUSCULAR | Status: AC
  Administered 2020-07-24: 10 mg via INTRAVENOUS
  Filled 2020-07-24: qty 1

## 2020-07-24 MED ORDER — METOCLOPRAMIDE HCL 5 MG/ML IJ SOLN
10.0000 mg | Freq: Once | INTRAMUSCULAR | Status: AC
  Administered 2020-07-24: 10 mg via INTRAVENOUS
  Filled 2020-07-24: qty 2

## 2020-07-24 MED ORDER — SODIUM CHLORIDE 0.9 % IV BOLUS
1000.0000 mL | Freq: Once | INTRAVENOUS | Status: AC
  Administered 2020-07-24: 1000 mL via INTRAVENOUS

## 2020-07-24 MED ORDER — DIPHENHYDRAMINE HCL 50 MG/ML IJ SOLN
25.0000 mg | Freq: Once | INTRAMUSCULAR | Status: AC
  Administered 2020-07-24: 25 mg via INTRAVENOUS
  Filled 2020-07-24: qty 1

## 2020-07-24 MED ORDER — KETOROLAC TROMETHAMINE 30 MG/ML IJ SOLN
30.0000 mg | Freq: Once | INTRAMUSCULAR | Status: AC
  Administered 2020-07-24: 30 mg via INTRAVENOUS
  Filled 2020-07-24: qty 1

## 2020-07-24 NOTE — ED Notes (Signed)
Patient complains of headache.  Wife reports speech was slurred earlier but has returned to normal at this time.

## 2020-07-24 NOTE — Telephone Encounter (Signed)
Pt wife called wanting to know if husband could have a Migraine medication called into the  [place], however she did say he was on traMADol (ULTRAM) 50 MG tablet in the morning so she isn't sure if that would interfere with anything, if you have any questions wife said to please give her a call.

## 2020-07-24 NOTE — ED Triage Notes (Signed)
Pt here with wife who states that patient is healing from a brain bleed in February from a fall. States that he has had seizures since the bleed in April. Today she has noticed that he has had a headache that has not went away and ever since he woke up from his nap he has been confused and still has a headache.

## 2020-07-24 NOTE — Telephone Encounter (Signed)
I called the wife and I called the patient.  I left messages, I will call back tomorrow.

## 2020-07-24 NOTE — ED Notes (Signed)
MD at bedside. 

## 2020-07-24 NOTE — ED Provider Notes (Signed)
[facility] EMERGENCY DEPARTMENT Provider Note   CSN: [id number] Arrival date & time: 07/24/20  2048     History Chief Complaint  Patient presents with   Altered Mental Status    Nicholas [name] is a 60 y.o. male.  Patient is a 60 year old male with past medical history of coronary artery disease with stent, diabetes, seizures, prior CVA, diabetes, and admission in April for bilateral subdural hematomas not requiring intervention.  Patient presenting today for evaluation of headache.  He describes a pressure to the front of his head that has been present for the past 2 days.  This afternoon, according to the wife he was "slurring his speech".  He seems better now however.  Patient denies any fevers, chills, or stiff neck.  He denies visual disturbances.  He has taken Tylenol at home with little relief.  The history is provided by the patient.      Past Medical History:  Diagnosis Date   Anal lesion    Anal papilla/tags - external lesion   Bilateral carpal tunnel syndrome 11/24/2018   Colon polyp    Hyperplastic rectosigmoid polyp 9/08   Coronary atherosclerosis of native coronary artery    DES to PLB, BMS to mid and distal RCA, DES to circumflex and distal LAD, 6/10 (5 stents)   Diabetic peripheral neuropathy ([place]) 11/24/2018   GERD (gastroesophageal reflux disease)    Helicobacter pylori (H. pylori) 09/2009   Treated with helidac   Hiatal hernia    Hyperplastic colon polyp 2008   Mixed hyperlipidemia    Myocardial infarction ([place]) 06/2008   Seizures ([place])    Stroke [facility])    January 2018   Type 2 diabetes mellitus [facility])     Patient Active Problem List   Diagnosis Date Noted   Acute metabolic encephalopathy 07/23/9483   Receptive aphasia    Subdural hematoma ([place]) 05/09/2020   Polypharmacy 06/17/2019   Encounter for screening colonoscopy 06/17/2019   Vitamin D deficiency 05/10/2019   Diabetic peripheral neuropathy ([place]) 11/24/2018   Bilateral carpal tunnel syndrome  11/24/2018   Acute ischemic stroke ([place]) 03/03/2018   Uncontrolled type 2 diabetes mellitus with hyperglycemia ([place]) 04/21/2017   Hyperlipemia 04/21/2016   Malnutrition of moderate degree 02/11/2016   History of stroke 02/11/2016   TIA (transient ischemic attack) 02/09/2016   Right carotid bruit 02/09/2016   Personal history of noncompliance with medical treatment, presenting hazards to health 02/09/2016   Type 2 diabetes mellitus with vascular disease ([place]) 12/12/2014   Essential hypertension, benign 12/12/2014   Hyperlipidemia 12/12/2014   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 Gala Romney- rectosigmoid polyp s/phot snare polypectomy o/w normal rectum and terminal ileum. hyperplastic polyp on bx   COLONOSCOPY WITH PROPOFOL N/A 09/01/2019   Procedure: COLONOSCOPY WITH PROPOFOL;  Surgeon: Daneil Dolin, MD;  [facility];  Service: Endoscopy;  Laterality: N/A;  8:30am   EP IMPLANTABLE DEVICE N/A 02/12/2016   Procedure: Loop Recorder Insertion;  Surgeon: Thompson Grayer, MD;  Location: [place] CV LAB;  Service: Cardiovascular;  Laterality: N/A;   ESOPHAGOGASTRODUODENOSCOPY  10/12/2006   Dr. Gala Romney- examination of the tubular esophagus revealed no mucosal abnormalities. the EG junction was easily traversed.  small hiatal hernia, the gastric mucosa o/w appeared normal. there was no infiltrating process or frank ulcer seen.   implantable loop recorder removal  09/29/2019   MDT Reveal LINQ removed in office by Dr Rayann Heman   TEE WITHOUT CARDIOVERSION N/A 02/12/2016   Procedure: TRANSESOPHAGEAL ECHOCARDIOGRAM (TEE);  Surgeon: Jerline Pain, MD;  Location: [facility] ENDOSCOPY;  Service: Cardiovascular;  Laterality: N/A;   WIRE IN APEX OF  RIGHT LUNG     Since childhood       Family History  Problem Relation Age of Onset   Diabetes Mother    Heart disease Father    Cancer Brother    CVA Neg Hx     Social History   Tobacco Use   Smoking status: Every Day    Packs/day: 0.50    Years: 34.00    Pack years: 17.00    Types: Cigarettes    Start date: 04/08/1973   Smokeless tobacco: Never  Vaping Use   Vaping Use: Never used  Substance Use Topics   Alcohol use: No    Alcohol/week: 0.0 standard drinks   Drug use: No    Home Medications Prior to Admission medications   Medication Sig Start Date End Date Taking? Authorizing Provider  acetaminophen (TYLENOL) 500 MG tablet Take 500 mg by mouth every 6 (six) hours as needed (for headaches).    [provider]  albuterol (VENTOLIN HFA) 108 (90 Base) MCG/ACT inhaler Inhale 1-2 puffs into the lungs every 6 (six) hours as needed for wheezing or shortness of breath.    [provider]  aspirin EC 81 MG EC tablet Take 1 tablet (81 mg total) by mouth daily. Swallow whole. 05/12/20   George Hugh, MD  BD PEN NEEDLE NANO U/F 32G X 4 MM MISC USE AS DIRECTED FOUR TIMES DAILY. 08/31/17   Cassandria Anger, MD  Continuous Blood Gluc Receiver (FREESTYLE LIBRE 2 READER) DEVI As directed 12/16/19   Cassandria Anger, MD  gabapentin (NEURONTIN) 100 MG capsule Take 100 mg by mouth daily.  01/29/19   [provider]  lacosamide (VIMPAT) 50 MG TABS tablet Take 1 tablet (50 mg total) by mouth 2 (two) times daily. 07/04/20   Kathrynn Ducking, MD  LANTUS SOLOSTAR 100 UNIT/ML Solostar Pen INJECT 40 UNITS SUBCUTANEOUSLY ONCE DAILY Patient taking differently: 30 Units. 06/18/20   Cassandria Anger, MD  nitroGLYCERIN (NITROSTAT) 0.4 MG SL tablet DISSOLVE 1 TABLET UNDER TONGUE EVERY 5 MINUTES UP TO 15 MIN FOR CHESTPAIN. IF NO RELIEF CALL 911. Patient taking differently: Place 0.4 mg under the tongue every 5 (five) minutes as needed for chest pain. 05/17/19    Satira Sark, MD  pantoprazole (PROTONIX) 40 MG tablet Take 40 mg by mouth daily before breakfast. 04/11/10   [provider]  rosuvastatin (CRESTOR) 5 MG tablet Take 1 tablet (5 mg total) by mouth daily. 07/05/20   Strader, Fransisco Hertz, PA-C  traMADol (ULTRAM) 50 MG tablet Take 1 tablet (50 mg total) by mouth every 6 (six) hours as needed for moderate pain. Patient taking differently: Take 50 mg by mouth daily. 02/12/16   Geradine Girt, DO    Allergies    Patient has no known allergies.  Review of Systems   Review of Systems  All other systems reviewed and are negative.  Physical Exam Updated Vital Signs BP 113/77 (BP Location: Left Arm)   Pulse 91   Temp 98 F (36.7 C) (Oral)   Resp 14  Ht 5\' 9"  (1.753 m)   Wt 74.8 kg   SpO2 98%   BMI 24.37 kg/m   Physical Exam Vitals and nursing note reviewed.  Constitutional:      General: He is not in acute distress.    Appearance: He is well-developed. He is not diaphoretic.  HENT:     Head: Normocephalic and atraumatic.  Eyes:     Extraocular Movements: Extraocular movements intact.     Pupils: Pupils are equal, round, and reactive to light.  Cardiovascular:     Rate and Rhythm: Normal rate and regular rhythm.     Heart sounds: No murmur heard.   No friction rub.  Pulmonary:     Effort: Pulmonary effort is normal. No respiratory distress.     Breath sounds: Normal breath sounds. No wheezing or rales.  Abdominal:     General: Bowel sounds are normal. There is no distension.     Palpations: Abdomen is soft.     Tenderness: There is no abdominal tenderness.  Musculoskeletal:        General: Normal range of motion.     Cervical back: Normal range of motion and neck supple.  Skin:    General: Skin is warm and dry.  Neurological:     General: No focal deficit present.     Mental Status: He is alert and oriented to person, place, and time. Mental status is at baseline.     Cranial Nerves: No cranial nerve deficit.      Motor: No weakness.     Coordination: Coordination normal.     Gait: Gait normal.    ED Results / Procedures / Treatments   Labs (all labs ordered are listed, but only abnormal results are displayed) Labs Reviewed  COMPREHENSIVE METABOLIC PANEL - Abnormal; Notable for the following components:      Result Value   Glucose, Bld 207 (*)    All other components within normal limits  CBC - Abnormal; Notable for the following components:   WBC 12.2 (*)    All other components within normal limits  CBG MONITORING, ED    EKG None  Radiology CT Head Wo Contrast  Result Date: 07/24/2020 CLINICAL DATA:  Headache EXAM: CT HEAD WITHOUT CONTRAST TECHNIQUE: Contiguous axial images were obtained from the base of the skull through the vertex without intravenous contrast. COMPARISON:  MRI 05/10/2020 FINDINGS: Brain: Previously noted small left chronic subdural hematoma has decreased in size since prior MRI examination. Chronic right subdural hematoma is miniscule, nearly resolved. No significant associated mass effect. Remote right parietal subcortical white matter infarct is unchanged. No evidence of acute intracranial hemorrhage or infarct. No abnormal mass effect or midline shift. No abnormal intra or extra-axial mass lesion. Ventricular size is normal. Cerebellum is unremarkable. Vascular: No asymmetric hyperdense vasculature at the skull base. Skull: Intact Sinuses/Orbits: The visualized orbits and paranasal sinuses are clear peer Other: Mastoid air cells and middle ear cavities are clear. IMPRESSION: Bilateral chronic subdural hematoma have decreased in size since prior MRI examination of 05/10/2020, nearly resolved on the right. No associated abnormal mass effect. No interval hemorrhage. No acute infarct. Stable remote right parietal subcortical white matter infarct. Electronically Signed   By: Fidela Salisbury MD   On: 07/24/2020 22:57    Procedures Procedures   Medications Ordered in  ED Medications  sodium chloride 0.9 % bolus 1,000 mL (has no administration in time range)  ketorolac (TORADOL) 30 MG/ML injection 30 mg (has no administration in time  range)  metoCLOPramide (REGLAN) injection 10 mg (has no administration in time range)  diphenhydrAMINE (BENADRYL) injection 25 mg (has no administration in time range)  dexamethasone (DECADRON) injection 10 mg (has no administration in time range)    ED Course  I have reviewed the triage vital signs and the nursing notes.  Pertinent labs & imaging results that were available during my care of the patient were reviewed by me and considered in my medical decision making (see chart for details).    MDM Rules/Calculators/A&P  Patient presenting with complaints of headache.  He has history of bilateral subdural hematomas that occurred spontaneously related to Plavix back in April.  He is neurologically intact with head CT showing nearly completely resolved subdurals and nothing acute.  Patient feeling better after receiving a migraine cocktail.  At this point, I feel as though discharge is appropriate with as needed return.  Final Clinical Impression(s) / ED Diagnoses Final diagnoses:  None    Rx / DC Orders ED Discharge Orders     None        Veryl Speak, MD 07/25/20 0025

## 2020-07-25 MED ORDER — TOPIRAMATE 25 MG PO TABS: ORAL_TABLET | ORAL | 3 refills | Status: DC

## 2020-07-25 NOTE — Telephone Encounter (Signed)
I called and talk with family.  The patient went to the emergency room yesterday with a headache and aphasia, similar to what is happened in April.  The patient has been having low-grade headaches at least 5 out of 7 days a week, this most recent event was associated with a more severe headache, he was treated with a migraine cocktail which helped the headache but the patient does have some residual aphasia.  Previously, the aphasia lasted 3 to 4 days and then cleared.  CT scan of the brain done in the emergency room was unremarkable.  If the patient does not improve in the next several days, the family is to contact me and I will get MRI of the brain.  I will start Topamax for the headache.

## 2020-07-25 NOTE — Addendum Note (Signed)
Addended by: Kathrynn Ducking on: 07/25/2020 03:49 PM   Modules accepted: Orders

## 2020-07-25 NOTE — Discharge Instructions (Addendum)
Continue taking Tylenol 1000 mg every 6 hours as needed for pain.  Return to the emergency department if you develop worsening headache, visual disturbances, weakness/numbness, or other new and concerning symptoms.

## 2020-08-12 ENCOUNTER — Other Ambulatory Visit: Payer: Self-pay | Admitting: "Endocrinology

## 2020-08-13 ENCOUNTER — Telehealth: Payer: Self-pay | Admitting: Neurology

## 2020-08-13 MED ORDER — RIZATRIPTAN BENZOATE 10 MG PO TABS: 10.0000 mg | ORAL_TABLET | Freq: Three times a day (TID) | ORAL | 3 refills | Status: DC | PRN

## 2020-08-13 MED ORDER — NORTRIPTYLINE HCL 10 MG PO CAPS: ORAL_CAPSULE | ORAL | 3 refills | Status: DC

## 2020-08-13 NOTE — Telephone Encounter (Signed)
I called and talked with the wife. The patient is not able to tolerate the topamax at the 50 mg dose at night. We will stop the medication and start nortriptyline, working up to 30 mg at night. We will call in maxalt if needed.

## 2020-08-13 NOTE — Telephone Encounter (Signed)
Pt's wife Nicholas [name] called wanting to relay a message that her husband is not tolerating the increase of the topiramate (TOPAMAX) 25 MG tablet, and is there something else he could take. Also would like to know if Dr. Eugenie Birks could prescribe a migraine medication for when he gets migraines at home. Wife requesting a call back.

## 2020-08-13 NOTE — Telephone Encounter (Signed)
Do you recommend a decrease or another medication he can tolerate better ?

## 2020-08-17 ENCOUNTER — Other Ambulatory Visit: Payer: Self-pay

## 2020-08-17 MED ORDER — FREESTYLE LIBRE 14 DAY SENSOR MISC: 2 refills | Status: DC

## 2020-08-31 ENCOUNTER — Telehealth: Payer: Self-pay | Admitting: *Deleted

## 2020-08-31 DIAGNOSIS — E785 Hyperlipidemia, unspecified: Secondary | ICD-10-CM

## 2020-08-31 LAB — LIPID PANEL
Chol/HDL Ratio: 7.6 ratio — ABNORMAL HIGH (ref 0.0–5.0)
Cholesterol, Total: 228 mg/dL — ABNORMAL HIGH (ref 100–199)
Cholesterol: 230 mg/dL — ABNORMAL HIGH (ref <200)
HDL: 30 mg/dL — ABNORMAL LOW (ref >39)
HDL: 30 mg/dL — ABNORMAL LOW (ref >=40)
LDL Chol Calc (NIH): 135 mg/dL — ABNORMAL HIGH (ref 0–99)
LDL Cholesterol (Calc): 139 mg/dL (calc) — ABNORMAL HIGH
Non-HDL Cholesterol (Calc): 200 mg/dL (calc) — ABNORMAL HIGH (ref <130)
Total CHOL/HDL Ratio: 7.7 (calc) — ABNORMAL HIGH (ref <5.0)
Triglycerides: 345 mg/dL — ABNORMAL HIGH (ref 0–149)
Triglycerides: 396 mg/dL — ABNORMAL HIGH (ref <150)
VLDL Cholesterol Cal: 63 mg/dL — ABNORMAL HIGH (ref 5–40)

## 2020-08-31 LAB — TSH: TSH: 3.45 u[IU]/mL (ref 0.450–4.500)

## 2020-08-31 LAB — COMPREHENSIVE METABOLIC PANEL
ALT: 14 IU/L (ref 0–44)
AST: 17 IU/L (ref 0–40)
Albumin/Globulin Ratio: 1.8 (ref 1.2–2.2)
Albumin: 4.4 g/dL (ref 3.8–4.8)
Alkaline Phosphatase: 86 IU/L (ref 44–121)
BUN/Creatinine Ratio: 20 (ref 10–24)
BUN: 17 mg/dL (ref 8–27)
Bilirubin Total: 0.3 mg/dL (ref 0.0–1.2)
CO2: 22 mmol/L (ref 20–29)
Calcium: 9.8 mg/dL (ref 8.6–10.2)
Chloride: 103 mmol/L (ref 96–106)
Creatinine, Ser: 0.84 mg/dL (ref 0.76–1.27)
Globulin, Total: 2.5 g/dL (ref 1.5–4.5)
Glucose: 101 mg/dL — ABNORMAL HIGH (ref 65–99)
Potassium: 4.5 mmol/L (ref 3.5–5.2)
Sodium: 141 mmol/L (ref 134–144)
Total Protein: 6.9 g/dL (ref 6.0–8.5)
eGFR: 99 mL/min/{1.73_m2} (ref >59)

## 2020-08-31 LAB — HEPATIC FUNCTION PANEL
AG Ratio: 1.6 (calc) (ref 1.0–2.5)
ALT: 15 U/L (ref 9–46)
AST: 16 U/L (ref 10–35)
Albumin: 4.4 g/dL (ref 3.6–5.1)
Alkaline phosphatase (APISO): 74 U/L (ref 35–144)
Bilirubin, Direct: 0.1 mg/dL (ref 0.0–0.2)
Globulin: 2.8 g/dL (calc) (ref 1.9–3.7)
Indirect Bilirubin: 0.3 mg/dL (calc) (ref 0.2–1.2)
Total Bilirubin: 0.4 mg/dL (ref 0.2–1.2)
Total Protein: 7.2 g/dL (ref 6.1–8.1)

## 2020-08-31 LAB — T4, FREE: Free T4: 1 ng/dL (ref 0.82–1.77)

## 2020-08-31 LAB — VITAMIN D 25 HYDROXY (VIT D DEFICIENCY, FRACTURES): Vit D, 25-Hydroxy: 23.6 ng/mL — ABNORMAL LOW (ref 30.0–100.0)

## 2020-08-31 NOTE — Telephone Encounter (Signed)
-----   Message from Erma Heritage, [place] sent at 08/31/2020  8:05 AM EDT ----- Please let the patient know his cholesterol remains above goal with total cholesterol at 230, triglycerides 396 and LDL 139. Goal for his LDL is less than 70. Liver function is normal. He was previously intolerant to Atorvastatin due to myalgias and we started Crestor 5mg  daily at his last visit. If he has been taking this regularly and tolerating well, would recommend titration of this to 10mg  daily and rechecking FLP again in 2 months. Continue to try to limit intake of fried foods, fast-food, gravy, butter/starches, etc.

## 2020-09-03 ENCOUNTER — Other Ambulatory Visit: Payer: Self-pay

## 2020-09-03 ENCOUNTER — Encounter: Payer: Self-pay | Admitting: "Endocrinology

## 2020-09-03 ENCOUNTER — Ambulatory Visit (INDEPENDENT_AMBULATORY_CARE_PROVIDER_SITE_OTHER): Payer: BC Managed Care – PPO | Admitting: "Endocrinology

## 2020-09-03 VITALS — BP 116/64 | HR 88 | Ht 69.0 in | Wt 162.0 lb

## 2020-09-03 DIAGNOSIS — E559 Vitamin D deficiency, unspecified: Secondary | ICD-10-CM

## 2020-09-03 DIAGNOSIS — E782 Mixed hyperlipidemia: Secondary | ICD-10-CM

## 2020-09-03 DIAGNOSIS — I10 Essential (primary) hypertension: Secondary | ICD-10-CM

## 2020-09-03 DIAGNOSIS — E1159 Type 2 diabetes mellitus with other circulatory complications: Secondary | ICD-10-CM

## 2020-09-03 LAB — POCT GLYCOSYLATED HEMOGLOBIN (HGB A1C): HbA1c, POC (controlled diabetic range): 7.5 % — AB (ref 0.0–7.0)

## 2020-09-03 MED ORDER — ROSUVASTATIN CALCIUM 10 MG PO TABS: 10.0000 mg | ORAL_TABLET | Freq: Every day | ORAL | 1 refills | Status: DC

## 2020-09-03 MED ORDER — LANTUS SOLOSTAR 100 UNIT/ML ~~LOC~~ SOPN: 40.0000 [IU] | PEN_INJECTOR | Freq: Every day | SUBCUTANEOUS | 3 refills | Status: DC

## 2020-09-03 NOTE — Progress Notes (Signed)
09/03/2020  Endocrinology follow-up note  Subjective:    Patient ID: Nicholas [name], male    DOB: December 27, 1960,    Past Medical History:  Diagnosis Date   Anal lesion    Anal papilla/tags - external lesion   Bilateral carpal tunnel syndrome 11/24/2018   Colon polyp    Hyperplastic rectosigmoid polyp 9/08   Coronary atherosclerosis of native coronary artery    DES to PLB, BMS to mid and distal RCA, DES to circumflex and distal LAD, 6/10 (5 stents)   Diabetic peripheral neuropathy ([place]) 11/24/2018   GERD (gastroesophageal reflux disease)    Helicobacter pylori (H. pylori) 09/2009   Treated with helidac   Hiatal hernia    Hyperplastic colon polyp 2008   Mixed hyperlipidemia    Myocardial infarction ([place]) 06/2008   Seizures ([place])    Stroke [facility])    January 2018   Type 2 diabetes mellitus [facility])    Past Surgical History:  Procedure Laterality Date   CHOLECYSTECTOMY     COLLAPSED LUNG     15 years ago   COLONOSCOPY  10/12/2006   Dr. Gala Romney- rectosigmoid polyp s/phot snare polypectomy o/w normal rectum and terminal ileum. hyperplastic polyp on bx   COLONOSCOPY WITH PROPOFOL N/A 09/01/2019   Procedure: COLONOSCOPY WITH PROPOFOL;  Surgeon: Daneil Dolin, MD;  Location: [facility];  Service: Endoscopy;  Laterality: N/A;  8:30am   EP IMPLANTABLE DEVICE N/A 02/12/2016   Procedure: Loop Recorder Insertion;  Surgeon: Thompson Grayer, MD;  Location: [place] CV LAB;  Service: Cardiovascular;  Laterality: N/A;   ESOPHAGOGASTRODUODENOSCOPY  10/12/2006   Dr. Gala Romney- examination of the tubular esophagus revealed no mucosal abnormalities. the EG junction was easily traversed. small hiatal hernia, the gastric mucosa o/w appeared normal. there was no infiltrating process or frank ulcer seen.   implantable loop recorder removal  09/29/2019   MDT Reveal LINQ removed in office by Dr Rayann Heman   TEE WITHOUT CARDIOVERSION N/A 02/12/2016   Procedure: TRANSESOPHAGEAL ECHOCARDIOGRAM (TEE);  Surgeon: Jerline Pain, MD;  Location: [facility] ENDOSCOPY;  Service: Cardiovascular;  Laterality: N/A;   WIRE IN APEX OF RIGHT LUNG     Since childhood   Social History   Socioeconomic History   Marital status: Married    Spouse name: Not on file   Number of children: Not on file   Years of education: Not on file   Highest education level: Not on file  Occupational History   Occupation: maintenance, parking lots    Employer: SELF-EMPLOYED  Tobacco Use   Smoking status: Every Day    Packs/day: 0.50    Years: 34.00    Pack years: 17.00    Types: Cigarettes    Start date: 04/08/1973   Smokeless tobacco: Never  Vaping Use   Vaping Use: Never used  Substance and Sexual Activity   Alcohol use: No    Alcohol/week: 0.0 standard drinks   Drug use: No   Sexual activity: Yes    Partners: Female    Birth control/protection: Pill  Other Topics Concern   Not on file  Social History Narrative   Drinks Coffee 2 cups daily.  Lives sat home with wife.   Is self employed.  Graduated from High school.     Social Determinants of Health   Financial Resource Strain: Not on file  Food Insecurity: Not on file  Transportation Needs: Not on file  Physical Activity: Not on file  Stress: Not on file  Social Connections:  Not on file   Outpatient Encounter Medications as of 09/03/2020  Medication Sig   acetaminophen (TYLENOL) 500 MG tablet Take 500 mg by mouth every 6 (six) hours as needed (for headaches).   albuterol (VENTOLIN HFA) 108 (90 Base) MCG/ACT inhaler Inhale 1-2 puffs into the lungs every 6 (six) hours as needed for wheezing or shortness of breath.   aspirin EC 81 MG EC tablet Take 1 tablet (81 mg total) by mouth daily. Swallow whole.   BD PEN NEEDLE NANO U/F 32G X 4 MM MISC USE AS DIRECTED FOUR TIMES DAILY.   Continuous Blood Gluc Receiver (FREESTYLE LIBRE 2 READER) DEVI As directed   Continuous Blood Gluc Sensor (FREESTYLE LIBRE 14 DAY SENSOR) MISC Use as directed. Change sensor every 14 days   gabapentin  (NEURONTIN) 100 MG capsule Take 100 mg by mouth daily.    insulin glargine (LANTUS SOLOSTAR) 100 UNIT/ML Solostar Pen Inject 40 Units into the skin at bedtime. INJECT 40 UNITS SUBCUTANEOUSLY ONCE DAILY   lacosamide (VIMPAT) 50 MG TABS tablet Take 1 tablet (50 mg total) by mouth 2 (two) times daily.   nitroGLYCERIN (NITROSTAT) 0.4 MG SL tablet DISSOLVE 1 TABLET UNDER TONGUE EVERY 5 MINUTES UP TO 15 MIN FOR CHESTPAIN. IF NO RELIEF CALL 911. (Patient taking differently: Place 0.4 mg under the tongue every 5 (five) minutes as needed for chest pain.)   nortriptyline (PAMELOR) 10 MG capsule Take one capsule at night for one week, then take 2 capsules at night for one week, then take 3 capsules at night   pantoprazole (PROTONIX) 40 MG tablet Take 40 mg by mouth daily before breakfast.   rizatriptan (MAXALT) 10 MG tablet Take 1 tablet (10 mg total) by mouth 3 (three) times daily as needed for migraine.   rosuvastatin (CRESTOR) 10 MG tablet Take 1 tablet (10 mg total) by mouth daily.   traMADol (ULTRAM) 50 MG tablet Take 1 tablet (50 mg total) by mouth every 6 (six) hours as needed for moderate pain. (Patient taking differently: Take 50 mg by mouth daily.)   [DISCONTINUED] LANTUS SOLOSTAR 100 UNIT/ML Solostar Pen INJECT 40 UNITS SUBCUTANEOUSLY ONCE DAILY (Patient taking differently: 40 Units.)   [DISCONTINUED] rosuvastatin (CRESTOR) 5 MG tablet Take 1 tablet (5 mg total) by mouth daily.   No facility-administered encounter medications on file as of 09/03/2020.   ALLERGIES: Allergies  Allergen Reactions   Topamax [Topiramate]     irritable   VACCINATION STATUS:  There is no immunization history on file for this patient.  Diabetes He presents for his follow-up diabetic visit. He has type 2 diabetes mellitus. Onset time: He was diagnosed at approximate age of 16 years. In this particular patient with a history of heavy alcohol use possiblity of pancreatic diabetes is high. His disease course has been  stable. Pertinent negatives for hypoglycemia include no confusion, headaches, pallor, seizures or speech difficulty. Pertinent negatives for diabetes include no chest pain, no fatigue, no polydipsia, no polyphagia, no polyuria and no weakness. There are no hypoglycemic complications. Symptoms are stable. Diabetic complications include a CVA and heart disease. Risk factors for coronary artery disease include diabetes mellitus, dyslipidemia, hypertension, male sex, sedentary lifestyle and tobacco exposure. Current diabetic treatment includes oral agent (dual therapy). He is compliant with treatment some of the time. His weight is fluctuating minimally. He is following a generally unhealthy diet. When asked about meal planning, he reported none. He has not had a previous visit with a dietitian (he did not keep appointment.).  He rarely participates in exercise. His home blood glucose trend is decreasing steadily. His breakfast blood glucose range is generally 140-180 mg/dl. His lunch blood glucose range is generally 140-180 mg/dl. His dinner blood glucose range is generally 140-180 mg/dl. His bedtime blood glucose range is generally 140-180 mg/dl. His overall blood glucose range is 140-180 mg/dl. (He returns with his CGM device showing improving glycemic profile, averaging 146 for the last 14 days.  He is CGM analysis shows 71% time in range, 24% above range, no significant hypoglycemia.  His point-of-care A1c 7.5%.    ) Eye exam is current.  Hyperlipidemia This is a chronic problem. The current episode started more than 1 year ago. Exacerbating diseases include diabetes. Pertinent negatives include no chest pain, myalgias or shortness of breath. Current antihyperlipidemic treatment includes statins. Risk factors for coronary artery disease include dyslipidemia, diabetes mellitus, hypertension and a sedentary lifestyle.  Hypertension This is a chronic problem. The current episode started more than 1 year ago. The  problem is controlled. Pertinent negatives include no chest pain, headaches, neck pain, palpitations or shortness of breath. Risk factors for coronary artery disease include diabetes mellitus, dyslipidemia and smoking/tobacco exposure. Past treatments include beta blockers. Hypertensive end-organ damage includes CAD/MI and CVA.   Review of Systems  Constitutional:  Negative for fatigue and unexpected weight change.  HENT:  Negative for dental problem, mouth sores and trouble swallowing.   Eyes:  Negative for visual disturbance.  Respiratory:  Negative for cough, choking, chest tightness, shortness of breath and wheezing.   Cardiovascular:  Negative for chest pain, palpitations and leg swelling.  Gastrointestinal:  Negative for abdominal distention, abdominal pain, constipation, diarrhea, nausea and vomiting.  Endocrine: Negative for polydipsia, polyphagia and polyuria.  Genitourinary:  Negative for dysuria, flank pain, hematuria and urgency.  Musculoskeletal:  Negative for back pain, gait problem, myalgias and neck pain.  Skin:  Negative for pallor, rash and wound.  Neurological:  Negative for seizures, syncope, speech difficulty, weakness, numbness and headaches.  Psychiatric/Behavioral:  Negative for confusion and dysphoric mood.    Objective:    BP 116/64   Pulse 88   Ht 5\' 9"  (1.753 m)   Wt 162 lb (73.5 kg)   BMI 23.92 kg/m   Wt Readings from Last 3 Encounters:  09/03/20 162 lb (73.5 kg)  07/24/20 165 lb (74.8 kg)  07/05/20 165 lb (74.8 kg)    Physical Exam Constitutional:      General: He is not in acute distress.    Appearance: He is well-developed.  HENT:     Head: Normocephalic and atraumatic.  Neck:     Thyroid: No thyromegaly.     Trachea: No tracheal deviation.  Cardiovascular:     Rate and Rhythm: Normal rate.     Pulses:          Dorsalis pedis pulses are 1+ on the right side and 1+ on the left side.       Posterior tibial pulses are 1+ on the right side and 1+  on the left side.     Heart sounds: S1 normal and S2 normal. No murmur heard.   No gallop.  Pulmonary:     Effort: Pulmonary effort is normal. No respiratory distress.     Breath sounds: No wheezing.  Abdominal:     General: There is no distension.     Tenderness: There is no abdominal tenderness. There is no guarding.  Musculoskeletal:     Right shoulder: No swelling or  deformity.     Cervical back: Normal range of motion and neck supple.  Skin:    General: Skin is warm and dry.     Findings: No rash.     Nails: There is no clubbing.  Neurological:     Mental Status: He is alert and oriented to person, place, and time.     Cranial Nerves: No cranial nerve deficit.     Sensory: No sensory deficit.     Gait: Gait normal.  Psychiatric:        Speech: Speech normal.        Behavior: Behavior is cooperative.     Comments:  passive and unconcerned affect.    Results for orders placed or performed in visit on 09/03/20  HgB A1c  Result Value Ref Range   Hemoglobin A1C     HbA1c POC (<> result, manual entry)     HbA1c, POC (prediabetic range)     HbA1c, POC (controlled diabetic range) 7.5 (A) 0.0 - 7.0 %   Diabetic Labs (most recent): Lab Results  Component Value Date   HGBA1C 7.5 (A) 09/03/2020   HGBA1C 8.0 (H) 05/09/2020   HGBA1C 7.9 (A) 04/30/2020   Lipid Panel     Component Value Date/Time   CHOL 228 (H) 08/30/2020 0811   CHOL 230 (H) 08/30/2020 0741   TRIG 345 (H) 08/30/2020 0811   HDL 30 (L) 08/30/2020 0811   HDL 30 (L) 08/30/2020 0741   CHOLHDL 7.6 (H) 08/30/2020 0811   CHOLHDL 7.7 (H) 08/30/2020 0741   VLDL 20 05/11/2020 0224   LDLCALC 135 (H) 08/30/2020 0811   LDLCALC 139 (H) 08/30/2020 0741     Assessment & Plan:   1. Type 2 diabetes mellitus with vascular disease (HCC) -His diabetes is  complicated by coronary artery disease and patient remains at a high risk for more acute and chronic complications of diabetes which include CAD, CVA, CKD, retinopathy,  and neuropathy. These are all discussed in detail with the patient.  He returns with his CGM device showing improving glycemic profile, averaging 146 for the last 14 days.  He is CGM analysis shows 71% time in range, 24% above range, no significant hypoglycemia.  His point-of-care A1c 7.5%.    - I have re-counseled the patient on diet management  by adopting a carbohydrate restricted / protein rich  Diet.  - he acknowledges that there is a room for improvement in his food and drink choices. - Suggestion is made for him to avoid simple carbohydrates  from his diet including Cakes, Sweet Desserts, Ice Cream, Soda (diet and regular), Sweet Tea, Candies, Chips, Cookies, Store Bought Juices, Alcohol in Excess of  1-2 drinks a day, Artificial Sweeteners,  Coffee Creamer, and "Sugar-free" Products, Lemonade. This will help patient to have more stable blood glucose profile and potentially avoid unintended weight gain.   - Patient is advised to stick to a routine mealtimes to eat 3 meals  a day and avoid unnecessary snacks ( to snack only to correct hypoglycemia).  - I have approached patient with the following individualized plan to manage diabetes and patient agrees.  - Proper use of insulin is the exclusive choice he has to treat his diabetes, currently doing well only with basal insulin.  He did not comply with multiple daily injections of insulin.   -He is advised to continue Lantus 40 units nightly, discontinue NovoLog for now.  He is still urged to use his CGM device at least twice  a day-daily before breakfast and at bedtime.    -He is encouraged to call clinic for blood glucose levels less than 70 or above 200 mg /dl.   -He will be reconsidered for NovoLog when he shows proper engagement and commitment for monitoring. - He is not the right candidate for Invokana nor incretintherapy.  -Target numbers for A1c, LDL, HDL, Triglycerides,  were discussed in detail.   2) BP/HTN:  -His blood pressure  is controlled to target.  He is advised to continue his current medications including beta blockers.  He has marginal blood pressure, he will not tolerate additional ACE inhibitor's.     3) Lipids/HPL: His lipid panel shows still significantly above target LDL increasing to 135.  I discussed and increase his Crestor to 10 mg p.o. daily.  He has not been consistent taking it with his low-dose Crestor 5 mg p.o. daily.    Side effects and precautions discussed with him.      4)  Weight/Diet:   His BMI is 23.92.   He is not a candidate for weight loss.  He is  following with  CDE consult, exercise, and carbohydrates information provided.    5) Chronic Care/Health Maintenance:  -Patient is  Statin medications and encouraged to continue to follow up with Ophthalmology, Podiatrist at least yearly or according to recommendations, and advised to  quit Smoking (tragically he resumed smoking after he quit briefly during his diagnosis of CVA ). I have recommended yearly flu vaccine and pneumonia vaccination at least every 5 years; moderate intensity exercise for up to 150 minutes weekly; and  sleep for at least 7 hours a day.  The patient was counseled on the dangers of tobacco use, and was advised to quit.  Reviewed strategies to maximize success, including removing cigarettes and smoking materials from environment.   He recently had normal ABI, will be repeated in November 2026,  or sooner if needed. I advised patient to maintain close follow up with his PCP for primary care needs.     I spent 32 minutes in the care of the patient today including review of labs from [place], Lipids, Thyroid Function, Hematology (current and previous including abstractions from other facilities); face-to-face time discussing  his blood glucose readings/logs, discussing hypoglycemia and hyperglycemia episodes and symptoms, medications doses, his options of short and long term treatment based on the latest standards of care /  guidelines;  discussion about incorporating lifestyle medicine;  and documenting the encounter.    Please refer to Patient Instructions for Blood Glucose Monitoring and Insulin/Medications Dosing Guide"  in media tab for additional information. Please  also refer to " Patient Self Inventory" in the Media  tab for reviewed elements of pertinent patient history.  Nicholas [name] participated in the discussions, expressed understanding, and voiced agreement with the above plans.  All questions were answered to his satisfaction. he is encouraged to contact clinic should he have any questions or concerns prior to his return visit.    Follow up plan: Return in about 4 months (around 01/03/2021) for Bring Meter and Logs- A1c in Office.  Glade Lloyd, MD Phone: [phone]  Fax: [phone]  This note was partially dictated with voice recognition software. Similar sounding words can be transcribed inadequately or may not  be corrected upon review.  09/03/2020, 11:15 AM

## 2020-10-02 IMAGING — CT CT CHEST LUNG CANCER SCREENING LOW DOSE W/O CM
2 of 3 series · 15 of 36 positions shown, 18 images · non-contrast
Comparison: 05/16/2009 chest CT.  No prior screening CT.

CLINICAL DATA: One hundred twenty-nine pack-year smoking history.
Current smoker.

EXAM:
CT CHEST WITHOUT CONTRAST LOW-DOSE FOR LUNG CANCER SCREENING
TECHNIQUE: Multidetector CT imaging of the chest was performed following the
standard protocol without IV contrast.

[Series 2: axial st · axial · 0.74mm/px · z∈[+1124,+1409]mm · 12 of 69 slices shown, 15 images]
[im 6/69  mediastinal]
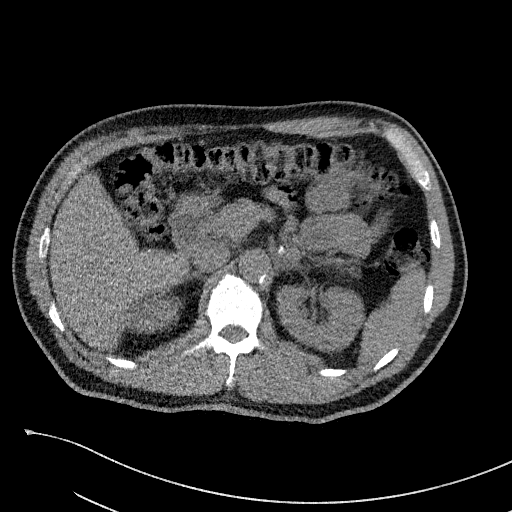
[im 6/69  lung]
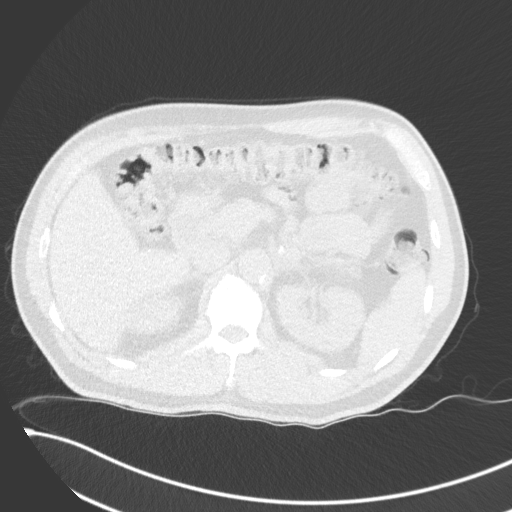
[im 11/69  lung]
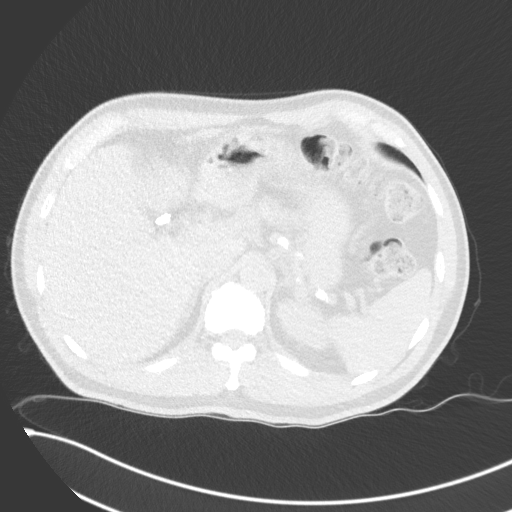
[im 16/69  lung]
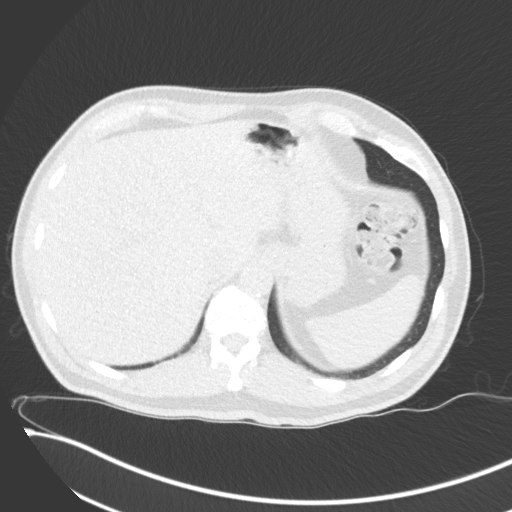
[im 21/69  lung]
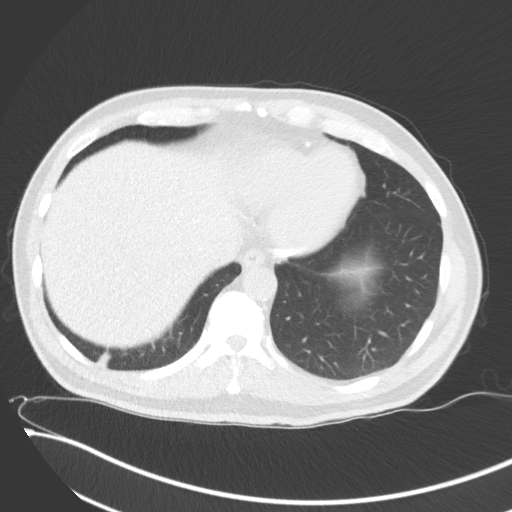
[im 26/69  mediastinal]
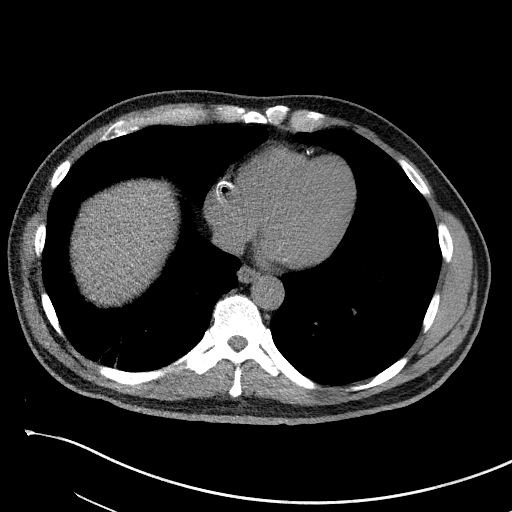
[im 26/69  lung]
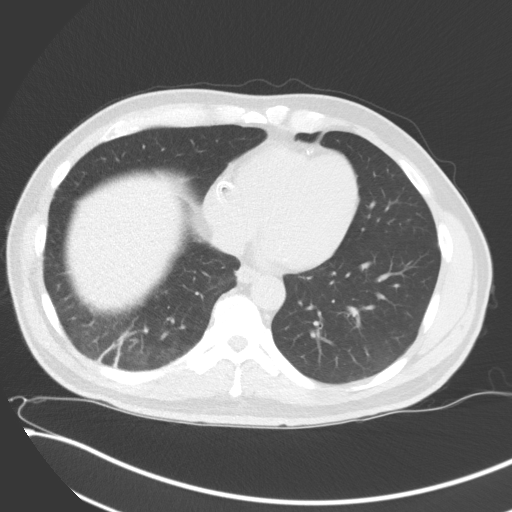
[im 31/69  lung]
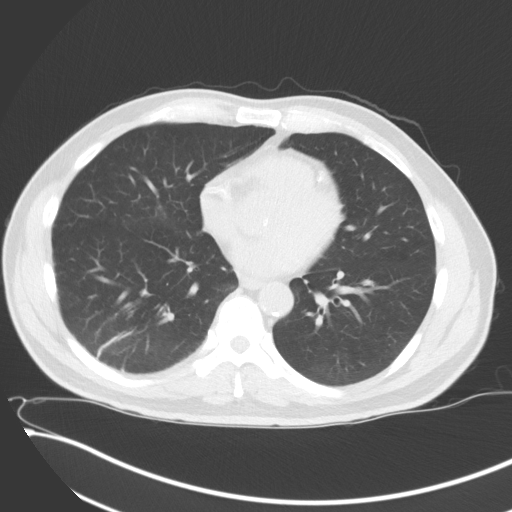
[im 38/69  lung]
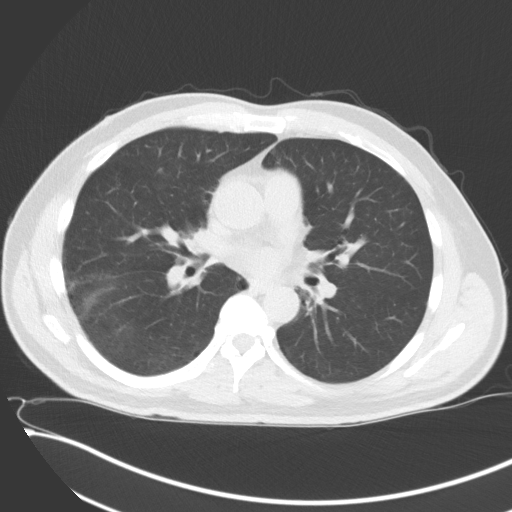
[im 43/69  lung]
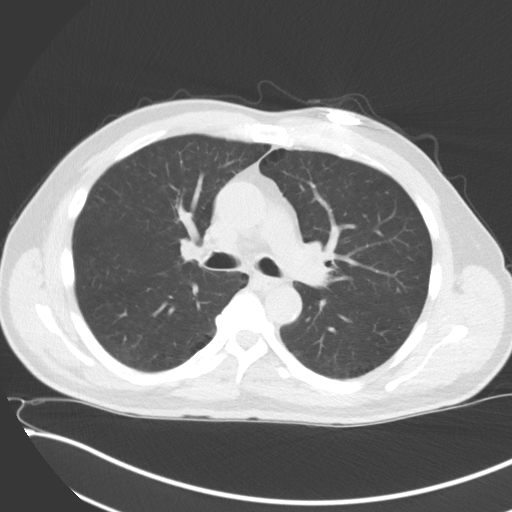
[im 48/69  mediastinal]
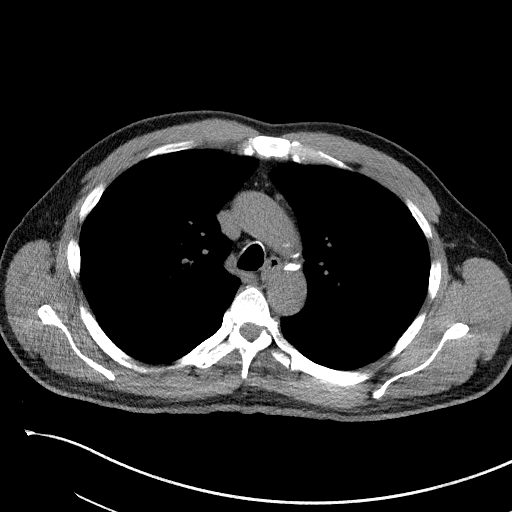
[im 48/69  lung]
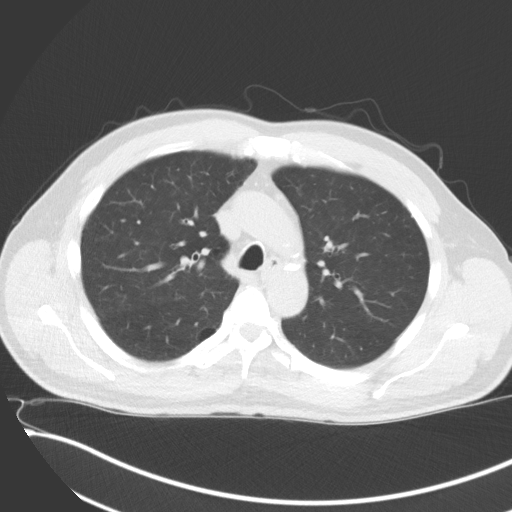
[im 53/69  lung]
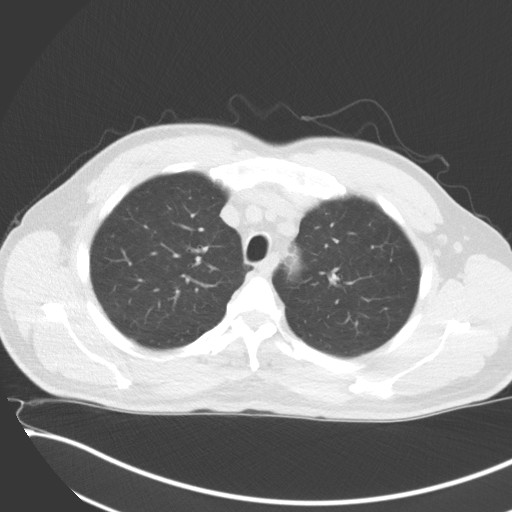
[im 58/69  lung]
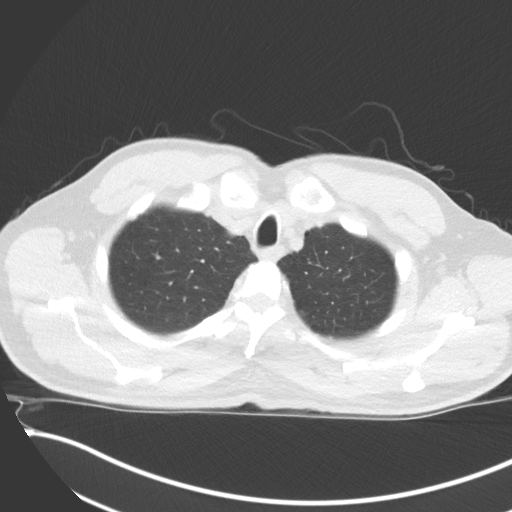
[im 63/69  lung]
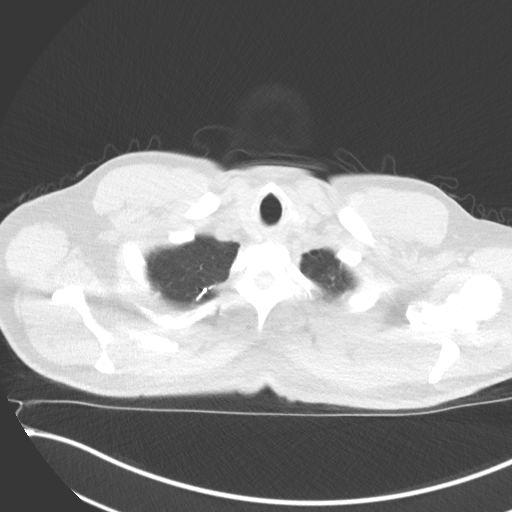

[Series 5: coronal · coronal · 0.68mm/px · 3 of 268 slices shown]
[im 54/268  lung]
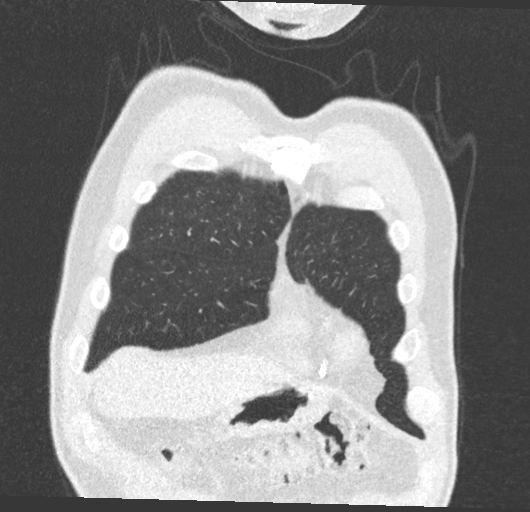
[im 107/268  lung]
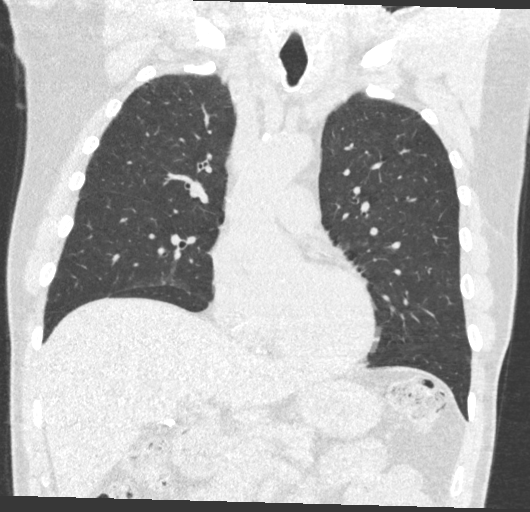
[im 161/268  lung]
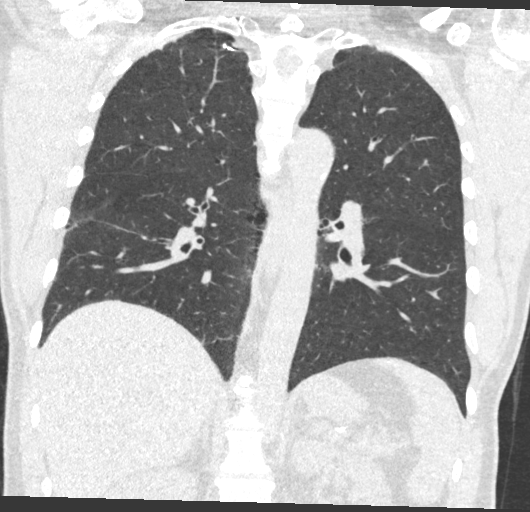

[15 of 36 positions shown; findings below may reference images not displayed]

FINDINGS: Cardiovascular: Aortic and branch vessel atherosclerosis. Normal
heart size, without pericardial effusion. Multivessel coronary
artery atherosclerosis.

Mediastinum/Nodes: No mediastinal or definite hilar adenopathy,
given limitations of unenhanced CT.

Lungs/Pleura: No pleural fluid. Right lower lobe scarring. Mild
centrilobular and paraseptal emphysema.

Right-sided pulmonary nodules, maximally volume derived equivalent
diameter 7.7 mm in the central right lower lobe, including on 193/4
and coronal image 166.

Upper Abdomen: Cholecystectomy. Normal imaged portions of the Liver,
spleen, stomach, pancreas, adrenal glands, kidneys. Abdominal aortic
atherosclerosis.

Musculoskeletal: Loop recorder.  No acute osseous abnormality.
IMPRESSION: 1. Lung-RADS 3, probably benign findings. Short-term follow-up in 6
months is recommended with repeat low-dose chest CT without contrast
(please use the following order, "CT CHEST LCS NODULE FOLLOW-UP W/O
CM"). Right lower lobe pulmonary nodule of volume derived equivalent
diameter 7.7 mm.
2. Aortic atherosclerosis ([id number]-[id number].Z), coronary artery
atherosclerosis and emphysema ([id number].3).

## 2020-11-01 NOTE — Progress Notes (Signed)
Chief Complaint  Patient presents with   Follow-up    Pt with wife, rm 2. He states overall stable and doing well. He still has some slight headaches but they are not everyday and has seen improvement with the nortiptyline. Triggers are loud noises and if he bends over.      HISTORY OF PRESENT ILLNESS:  11/07/20 ALL:  Nicholas [name] is a 60 y.o. male here today for follow up for headaches and subdural hematoma. Follow up CT reassuring for resolving subdural 06/2020. He continues Vimpat 500mg  BID for concerns of possible seizure like activity most likely complicated migraine. No obvious seizure activity. He was started on topiramate for complex migraines but could not tolerate and was switched to nortriptyline. He reports headaches are significantly better. He has had two migraines that he was able to abort with rizatriptan, however, has not had any significant migraines in a couple of months. He titrated nortriptyline to 30mg  about 4 weeks ago and is tolerating well. May take Tylenol 1-2 times a week for mild headaches. He is feeling well, today. He continues rosuvastatin and asa 81mg  for stroke prevention.     HISTORY (copied from Dr Jannifer Franklin' previous note)  Nicholas [name] is a 60 year old right-handed white male with a history of cerebrovascular disease with a prior small left occipital stroke in the past.  The patient has history of coronary artery disease, diabetes, and ongoing tobacco abuse.  He continues to smoke a pack of cigarettes daily.  He has gastroesophageal reflux disease.  The patient was admitted to the hospital on 09 May 2020 with a visual disturbance, headache, and some confusion/aphasia.  The patient reported that in February 2022 that he bumped his head when he fell and hit the left frontotemporal area of his head on a tree.  The patient did have some minor headaches afterwards but this was not an issue for him.  In the third week of March 2022, he went skeet shooting and noted  that when he fired a shotgun that he would get an uncomfortable sensation in the head, if he did not use this shotgun, the sensation did not occur.  On the April 13 admission, he presented with some tunnel vision issues, he had visual disturbance on both visual fields in the periphery and had preservation of center vision.  He had a severe headache, and he also appeared to be confused, he was trying to talk but had garbled speech that was not understandable.  When he went to the hospital, CT and MRI evaluation demonstrated bilateral small subdural hematoma.  No stroke was noted.  The patient was taken off of Plavix but kept on aspirin.  He was transiently treated with Keppra but was taken off the medication upon discharge.  The patient returned to the hospital on 21 May 2020 with some headache and slight confusion but no visual disturbance.  The patient at that point was placed on Vimpat and has remained on 50 mg twice daily.  The patient has had some minor headaches since coming out of the hospital but no severe headaches or visual disturbance or aphasia has been noted.  The patient reported no numbness or weakness of the face, arms, or legs.  He denied any gait disturbance or difficulty controlling the bowels or the bladder.  He does have some minor neck discomfort.  The patient comes to this office for further evaluation.  He remains on aspirin, off the Plavix.  He has not been operating  a motor vehicle.    REVIEW OF SYSTEMS: Out of a complete 14 system review of symptoms, the patient complains only of the following symptoms, headaches and all other reviewed systems are negative.   ALLERGIES: Allergies  Allergen Reactions   Keppra [Levetiracetam]     Hallucination   Topamax [Topiramate]     irritable     HOME MEDICATIONS: Outpatient Medications Prior to Visit  Medication Sig Dispense Refill   acetaminophen (TYLENOL) 500 MG tablet Take 500 mg by mouth every 6 (six) hours as needed (for  headaches).     albuterol (VENTOLIN HFA) 108 (90 Base) MCG/ACT inhaler Inhale 1-2 puffs into the lungs every 6 (six) hours as needed for wheezing or shortness of breath.     aspirin EC 81 MG EC tablet Take 1 tablet (81 mg total) by mouth daily. Swallow whole. 30 tablet 11   BD PEN NEEDLE NANO U/F 32G X 4 MM MISC USE AS DIRECTED FOUR TIMES DAILY. 100 each 2   Continuous Blood Gluc Receiver (FREESTYLE LIBRE 2 READER) DEVI As directed 1 each 0   Continuous Blood Gluc Sensor (FREESTYLE LIBRE 14 DAY SENSOR) MISC Use as directed. Change sensor every 14 days 2 each 2   gabapentin (NEURONTIN) 100 MG capsule Take 100 mg by mouth daily.      insulin glargine (LANTUS SOLOSTAR) 100 UNIT/ML Solostar Pen Inject 40 Units into the skin at bedtime. INJECT 40 UNITS SUBCUTANEOUSLY ONCE DAILY 15 mL 3   lacosamide (VIMPAT) 50 MG TABS tablet Take 1 tablet (50 mg total) by mouth 2 (two) times daily. 180 tablet 1   nitroGLYCERIN (NITROSTAT) 0.4 MG SL tablet DISSOLVE 1 TABLET UNDER TONGUE EVERY 5 MINUTES UP TO 15 MIN FOR CHESTPAIN. IF NO RELIEF CALL 911. (Patient taking differently: Place 0.4 mg under the tongue every 5 (five) minutes as needed for chest pain.) 25 tablet 3   nortriptyline (PAMELOR) 10 MG capsule Take one capsule at night for one week, then take 2 capsules at night for one week, then take 3 capsules at night (Patient taking differently: Take 30 mg by mouth at bedtime.) 90 capsule 3   pantoprazole (PROTONIX) 40 MG tablet Take 40 mg by mouth daily before breakfast.     rizatriptan (MAXALT) 10 MG tablet Take 1 tablet (10 mg total) by mouth 3 (three) times daily as needed for migraine. 10 tablet 3   rosuvastatin (CRESTOR) 5 MG tablet Take 5 mg by mouth daily.     traMADol (ULTRAM) 50 MG tablet Take 1 tablet (50 mg total) by mouth every 6 (six) hours as needed for moderate pain. (Patient taking differently: Take 50 mg by mouth daily.) 15 tablet 0   rosuvastatin (CRESTOR) 10 MG tablet Take 1 tablet (10 mg total) by  mouth daily. 90 tablet 1   No facility-administered medications prior to visit.     PAST MEDICAL HISTORY: Past Medical History:  Diagnosis Date   Anal lesion    Anal papilla/tags - external lesion   Bilateral carpal tunnel syndrome 11/24/2018   Colon polyp    Hyperplastic rectosigmoid polyp 9/08   Coronary atherosclerosis of native coronary artery    DES to PLB, BMS to mid and distal RCA, DES to circumflex and distal LAD, 6/10 (5 stents)   Diabetic peripheral neuropathy ([place]) 11/24/2018   GERD (gastroesophageal reflux disease)    Helicobacter pylori (H. pylori) 09/2009   Treated with helidac   Hiatal hernia    Hyperplastic colon polyp 2008  Mixed hyperlipidemia    Myocardial infarction ([place]) 06/2008   Seizures ([place])    Stroke [facility])    January 2018   Type 2 diabetes mellitus ([place])      PAST SURGICAL HISTORY: Past Surgical History:  Procedure Laterality Date   CHOLECYSTECTOMY     COLLAPSED LUNG     15 years ago   COLONOSCOPY  10/12/2006   Dr. Gala Romney- rectosigmoid polyp s/phot snare polypectomy o/w normal rectum and terminal ileum. hyperplastic polyp on bx   COLONOSCOPY WITH PROPOFOL N/A 09/01/2019   Procedure: COLONOSCOPY WITH PROPOFOL;  Surgeon: Daneil Dolin, MD;  [facility];  Service: Endoscopy;  Laterality: N/A;  8:30am   EP IMPLANTABLE DEVICE N/A 02/12/2016   Procedure: Loop Recorder Insertion;  Surgeon: Thompson Grayer, MD;  Location: [place] CV LAB;  Service: Cardiovascular;  Laterality: N/A;   ESOPHAGOGASTRODUODENOSCOPY  10/12/2006   Dr. Gala Romney- examination of the tubular esophagus revealed no mucosal abnormalities. the EG junction was easily traversed. small hiatal hernia, the gastric mucosa o/w appeared normal. there was no infiltrating process or frank ulcer seen.   implantable loop recorder removal  09/29/2019   MDT Reveal LINQ removed in office by Dr Rayann Heman   TEE WITHOUT CARDIOVERSION N/A 02/12/2016   Procedure: TRANSESOPHAGEAL ECHOCARDIOGRAM (TEE);   Surgeon: Jerline Pain, MD;  Location[facility] END[facility];  Service: Cardiovascular;  Laterality: N/A;   WIRE IN APEX OF RIGHT LUNG     Since childhood     FAMILY HISTORY: Family History  Problem Relation Age of Onset   Diabetes Mother    Heart disease Father    Cancer Brother    CVA Neg Hx      SOCIAL HISTORY: Social History   Socioeconomic History   Marital status: Married    Spouse name: Not on file   Number of children: Not on file   Years of education: Not on file   Highest education level: Not on file  Occupational History   Occupation: maintenance, parking lots    Employer: SELF-EMPLOYED  Tobacco Use   Smoking status: Every Day    Packs/day: 0.50    Years: 34.00    Pack years: 17.00    Types: Cigarettes    Start date: 04/08/1973   Smokeless tobacco: Never  Vaping Use   Vaping Use: Never used  Substance and Sexual Activity   Alcohol use: No    Alcohol/week: 0.0 standard drinks   Drug use: No   Sexual activity: Yes    Partners: Female    Birth control/protection: Pill  Other Topics Concern   Not on file  Social History Narrative   Drinks Coffee 2 cups daily.  Lives sat home with wife.   Is self employed.  Graduated from High school.     Social Determinants of Health   Financial Resource Strain: Not on file  Food Insecurity: Not on file  Transportation Needs: Not on file  Physical Activity: Not on file  Stress: Not on file  Social Connections: Not on file  Intimate Partner Violence: Not on file     PHYSICAL EXAM  Vitals:   11/07/20 0842  BP: 130/75  Pulse: 99  Weight: 168 lb (76.2 kg)  Height: 5\' 8"  (1.727 m)   Body mass index is 25.54 kg/m.  Generalized: Well developed, in no acute distress  Cardiology: normal rate and rhythm, no murmur auscultated  Respiratory: clear to auscultation bilaterally    Neurological examination  Mentation: Alert oriented to time, place,  history taking. Follows all commands speech and language fluent Cranial  nerve II-XII: Pupils were equal round reactive to light. Extraocular movements were full, visual field were full on confrontational test. Facial sensation and strength were normal. Head turning and shoulder shrug  were normal and symmetric. Motor: The motor testing reveals 5 over 5 strength of all 4 extremities. Good symmetric motor tone is noted throughout.  Sensory: Sensory testing is intact to soft touch on all 4 extremities. No evidence of extinction is noted.  Coordination: Cerebellar testing reveals good finger-nose-finger and heel-to-shin bilaterally.  Gait and station: Gait is normal.  Reflexes: Deep tendon reflexes are symmetric and normal bilaterally.    DIAGNOSTIC DATA (LABS, IMAGING, TESTING) - I reviewed patient records, labs, notes, testing and imaging myself where available.  Lab Results  Component Value Date   WBC 12.2 (H) 07/24/2020   HGB 15.5 07/24/2020   HCT 47.5 07/24/2020   MCV 83.9 07/24/2020   PLT 175 07/24/2020      Component Value Date/Time   NA 141 08/30/2020 0811   K 4.5 08/30/2020 0811   CL 103 08/30/2020 0811   CO2 22 08/30/2020 0811   GLUCOSE 101 (H) 08/30/2020 0811   GLUCOSE 207 (H) 07/24/2020 2124   BUN 17 08/30/2020 0811   CREATININE 0.84 08/30/2020 0811   CREATININE 0.78 05/02/2019 0918   CALCIUM 9.8 08/30/2020 0811   PROT 6.9 08/30/2020 0811   PROT 7.2 08/30/2020 0741   ALBUMIN 4.4 08/30/2020 0811   AST 17 08/30/2020 0811   ALT 14 08/30/2020 0811   ALKPHOS 86 08/30/2020 0811   BILITOT 0.3 08/30/2020 0811   BILITOT 0.4 08/30/2020 0741   GFRNONAA >60 07/24/2020 2124   GFRNONAA 98 09/02/2018 0725   GFRAA 115 10/20/2018 0954   GFRAA 114 09/02/2018 0725   Lab Results  Component Value Date   CHOL 228 (H) 08/30/2020   HDL 30 (L) 08/30/2020   LDLCALC 135 (H) 08/30/2020   TRIG 345 (H) 08/30/2020   CHOLHDL 7.6 (H) 08/30/2020   Lab Results  Component Value Date   HGBA1C 7.5 (A) 09/03/2020   Lab Results  Component Value Date    XNATFTDD22 025 02/11/2016   Lab Results  Component Value Date   TSH 3.450 08/30/2020    No flowsheet data found.   No flowsheet data found.   ASSESSMENT AND PLAN  60 y.o. year old male  has a past medical history of Anal lesion, Bilateral carpal tunnel syndrome (11/24/2018), Colon polyp, Coronary atherosclerosis of native coronary artery, Diabetic peripheral neuropathy ([place]) (11/24/2018), GERD (gastroesophageal reflux disease), Helicobacter pylori (H. pylori) (09/2009), Hiatal hernia, Hyperplastic colon polyp (2008), Mixed hyperlipidemia, Myocardial infarction ([place]) (06/2008), Seizures ([place]), Stroke ([place]), and Type 2 diabetes mellitus ([place]). here with    Complicated migraine  History of stroke  Subdural bleeding (H[facility])  Nicholas [name] is doing well, today. No seizure activity and headaches are significantly improved. No complex symptoms since April 2022. We will continue nortriptyline 30mg  daily at bedtime and rizatriptan as needed. I will have him wean Vimpat. He will take 50mg  daily for 2 weeks then 50mg  every other day for two weeks then stop medication. He will monitor closely for any concerns of seizure like or unusual headache symptoms. Healthy lifestyle habits encouraged. Smoking cessation discussed. He will continue stroke prevention and co morbidity follow up with PCP. He will return to see me in 4 months. He verbalizes understanding and agreement with this plan.    No orders of the defined  types were placed in this encounter.    No orders of the defined types were placed in this encounter.     Debbora Presto, MSN, FNP-C 11/07/2020, 10:36 AM  [facility] [address] [place], [place] [address] [phone]

## 2020-11-01 NOTE — Patient Instructions (Signed)
Below is our plan:  We will continue nortriptyline 30mg  daily. Use rizatriptan as needed for severe headaches and Tylenol for milder headaches. We will wean Vimpat to 50mg  daily for two weeks then 50mg  every other day for 2 weeks then you can stop. Please let me know if you have any unusual headaches or seizure like activity as discussed.   Please make sure you are staying well hydrated. I recommend 50-60 ounces daily. Well balanced diet and regular exercise encouraged. Consistent sleep schedule with 6-8 hours recommended.   Please continue follow up with care team as directed.   Follow up with me in 4 months   You may receive a survey regarding today's visit. I encourage you to leave honest feed back as I do use this information to improve patient care. Thank you for seeing me today!

## 2020-11-07 ENCOUNTER — Ambulatory Visit (INDEPENDENT_AMBULATORY_CARE_PROVIDER_SITE_OTHER): Payer: BC Managed Care – PPO | Admitting: Family Medicine

## 2020-11-07 ENCOUNTER — Other Ambulatory Visit: Payer: Self-pay

## 2020-11-07 ENCOUNTER — Encounter: Payer: Self-pay | Admitting: Family Medicine

## 2020-11-07 VITALS — BP 130/75 | HR 99 | Ht 68.0 in | Wt 168.0 lb

## 2020-11-07 DIAGNOSIS — Z8673 Personal history of transient ischemic attack (TIA), and cerebral infarction without residual deficits: Secondary | ICD-10-CM

## 2020-11-07 DIAGNOSIS — I6200 Nontraumatic subdural hemorrhage, unspecified: Secondary | ICD-10-CM | POA: Diagnosis not present

## 2020-11-07 DIAGNOSIS — G43109 Migraine with aura, not intractable, without status migrainosus: Secondary | ICD-10-CM

## 2020-12-04 ENCOUNTER — Ambulatory Visit: Payer: BC Managed Care – PPO | Admitting: Cardiology

## 2020-12-04 ENCOUNTER — Other Ambulatory Visit: Payer: Self-pay | Admitting: *Deleted

## 2020-12-04 ENCOUNTER — Encounter: Payer: Self-pay | Admitting: Cardiology

## 2020-12-04 VITALS — BP 120/80 | HR 95 | Ht 69.0 in | Wt 170.0 lb

## 2020-12-04 DIAGNOSIS — E785 Hyperlipidemia, unspecified: Secondary | ICD-10-CM | POA: Diagnosis not present

## 2020-12-04 DIAGNOSIS — I25119 Atherosclerotic heart disease of native coronary artery with unspecified angina pectoris: Secondary | ICD-10-CM

## 2020-12-04 DIAGNOSIS — Z79899 Other long term (current) drug therapy: Secondary | ICD-10-CM

## 2020-12-04 NOTE — Progress Notes (Signed)
Cardiology Office Note  Date: 12/04/2020   ID: Trase, Bunda 28-Feb-1960, MRN [id number]  PCP:  Sharilyn Sites, MD  Cardiologist:  Rozann Lesches, MD Electrophysiologist:  None   Chief Complaint  Patient presents with   Cardiac follow-up    History of Present Illness: JEMELL TOWN is a 60 y.o. male last seen in June by Ms. Strader PA-C.  He is here for a follow-up visit.  Overall stable from a cardiac perspective, he does not describe any active angina or nitroglycerin use.  He is off Plavix as discussed in the prior note.  He was started on Crestor back in June, had previous myalgias on Lipitor.  Tolerating Crestor well, we discussed getting a follow-up fasting lipid profile.  Otherwise he remains on low-dose aspirin.  Past Medical History:  Diagnosis Date   Anal lesion    Anal papilla/tags - external lesion   Bilateral carpal tunnel syndrome 11/24/2018   Colon polyp    Hyperplastic rectosigmoid polyp 9/08   Coronary atherosclerosis of native coronary artery    DES to PLB, BMS to mid and distal RCA, DES to circumflex and distal LAD, 6/10 (5 stents)   Diabetic peripheral neuropathy ([place]) 11/24/2018   GERD (gastroesophageal reflux disease)    Helicobacter pylori (H. pylori) 09/2009   Treated with helidac   Hiatal hernia    Hyperplastic colon polyp 2008   Mixed hyperlipidemia    Myocardial infarction ([place]) 06/2008   Seizures ([place])    Stroke [facility])    January 2018   Type 2 diabetes mellitus [facility])     Past Surgical History:  Procedure Laterality Date   CHOLECYSTECTOMY     COLLAPSED LUNG     15 years ago   COLONOSCOPY  10/12/2006   Dr. Gala Romney- rectosigmoid polyp s/phot snare polypectomy o/w normal rectum and terminal ileum. hyperplastic polyp on bx   COLONOSCOPY WITH PROPOFOL N/A 09/01/2019   Procedure: COLONOSCOPY WITH PROPOFOL;  Surgeon: Daneil Dolin, MD;  [facility];  Service: Endoscopy;  Laterality: N/A;  8:30am   EP IMPLANTABLE DEVICE N/A  02/12/2016   Procedure: Loop Recorder Insertion;  Surgeon: Thompson Grayer, MD;  Location: [place] CV LAB;  Service: Cardiovascular;  Laterality: N/A;   ESOPHAGOGASTRODUODENOSCOPY  10/12/2006   Dr. Gala Romney- examination of the tubular esophagus revealed no mucosal abnormalities. the EG junction was easily traversed. small hiatal hernia, the gastric mucosa o/w appeared normal. there was no infiltrating process or frank ulcer seen.   implantable loop recorder removal  09/29/2019   MDT Reveal LINQ removed in office by Dr Rayann Heman   TEE WITHOUT CARDIOVERSION N/A 02/12/2016   Procedure: TRANSESOPHAGEAL ECHOCARDIOGRAM (TEE);  Surgeon: Jerline Pain, MD;  [facility] END[facility];  Service: Cardiovascular;  Laterality: N/A;   WIRE IN APEX OF RIGHT LUNG     Since childhood    Current Outpatient Medications  Medication Sig Dispense Refill   albuterol (VENTOLIN HFA) 108 (90 Base) MCG/ACT inhaler Inhale 1-2 puffs into the lungs every 6 (six) hours as needed for wheezing or shortness of breath.     aspirin EC 81 MG EC tablet Take 1 tablet (81 mg total) by mouth daily. Swallow whole. 30 tablet 11   BD PEN NEEDLE NANO U/F 32G X 4 MM MISC USE AS DIRECTED FOUR TIMES DAILY. 100 each 2   Continuous Blood Gluc Receiver (FREESTYLE LIBRE 2 READER) DEVI As directed 1 each 0   Continuous Blood Gluc Sensor (FREESTYLE LIBRE 14 DAY  SENSOR) MISC Use as directed. Change sensor every 14 days 2 each 2   gabapentin (NEURONTIN) 100 MG capsule Take 100 mg by mouth daily.      insulin glargine (LANTUS SOLOSTAR) 100 UNIT/ML Solostar Pen Inject 40 Units into the skin at bedtime. INJECT 40 UNITS SUBCUTANEOUSLY ONCE DAILY 15 mL 3   lacosamide (VIMPAT) 50 MG TABS tablet Take 1 tablet (50 mg total) by mouth 2 (two) times daily. 180 tablet 1   nortriptyline (PAMELOR) 10 MG capsule Take one capsule at night for one week, then take 2 capsules at night for one week, then take 3 capsules at night (Patient taking differently: Take 30 mg by mouth at  bedtime.) 90 capsule 3   pantoprazole (PROTONIX) 40 MG tablet Take 40 mg by mouth daily before breakfast.     rosuvastatin (CRESTOR) 5 MG tablet Take 5 mg by mouth daily.     traMADol (ULTRAM) 50 MG tablet Take 1 tablet (50 mg total) by mouth every 6 (six) hours as needed for moderate pain. (Patient taking differently: Take 50 mg by mouth daily.) 15 tablet 0   acetaminophen (TYLENOL) 500 MG tablet Take 500 mg by mouth every 6 (six) hours as needed (for headaches). (Patient not taking: Reported on 12/04/2020)     nitroGLYCERIN (NITROSTAT) 0.4 MG SL tablet DISSOLVE 1 TABLET UNDER TONGUE EVERY 5 MINUTES UP TO 15 MIN FOR CHESTPAIN. IF NO RELIEF CALL 911. (Patient taking differently: Place 0.4 mg under the tongue every 5 (five) minutes as needed for chest pain.) 25 tablet 3   rizatriptan (MAXALT) 10 MG tablet Take 1 tablet (10 mg total) by mouth 3 (three) times daily as needed for migraine. (Patient not taking: Reported on 12/04/2020) 10 tablet 3   No current facility-administered medications for this visit.   Allergies:  Keppra [levetiracetam] and Topamax [topiramate]   ROS: No palpitations or syncope.  Physical Exam: VS:  BP 120/80 (BP Location: Left Arm, Patient Position: Sitting, Cuff Size: Normal)   Pulse 95   Ht 5\' 9"  (1.753 m)   Wt 170 lb (77.1 kg)   SpO2 97%   BMI 25.10 kg/m , BMI Body mass index is 25.1 kg/m.  Wt Readings from Last 3 Encounters:  12/04/20 170 lb (77.1 kg)  11/07/20 168 lb (76.2 kg)  09/03/20 162 lb (73.5 kg)    General: Patient appears comfortable at rest. HEENT: Conjunctiva and lids normal, wearing a mask. Neck: Supple, no elevated JVP or carotid bruits, no thyromegaly. Lungs: Clear to auscultation, nonlabored breathing at rest. Cardiac: Regular rate and rhythm, no S3 or significant systolic murmur, no pericardial rub. Extremities: No pitting edema.  ECG:  An ECG dated 07/24/2020 was personally reviewed today and demonstrated:  Sinus tachycardia with R' in lead  V1, rule out old inferior infarct pattern.  Recent Labwork: 05/23/2020: B Natriuretic Peptide 116.5; Magnesium 2.0 07/24/2020: Hemoglobin 15.5; Platelets 175 08/30/2020: ALT 14; AST 17; BUN 17; Creatinine, Ser 0.84; Potassium 4.5; Sodium 141; TSH 3.450     Component Value Date/Time   CHOL 228 (H) 08/30/2020 0811   CHOL 230 (H) 08/30/2020 0741   TRIG 345 (H) 08/30/2020 0811   HDL 30 (L) 08/30/2020 0811   HDL 30 (L) 08/30/2020 0741   CHOLHDL 7.6 (H) 08/30/2020 0811   CHOLHDL 7.7 (H) 08/30/2020 0741   VLDL 20 05/11/2020 0224   LDLCALC 135 (H) 08/30/2020 0811   LDLCALC 139 (H) 08/30/2020 0741    Other Studies Reviewed Today:  Echocardiogram 03/04/2018:  1. The left ventricle has normal systolic function of 16-01%. The cavity  size was normal. There is no increased left ventricular wall thickness.  Echo evidence of normal diastolic relaxation.   2. No evidence of left ventricular regional wall motion abnormalities.   3. The right ventricle has normal systolic function. The cavity was  normal. There is no increase in right ventricular wall thickness. Right  ventricular systolic pressure could not be assessed.   4. The aortic valve is tricuspid. There is moderate calcification of the  aortic valve. There is mild to moderate aortic annular calcification  noted.   5. The mitral valve is normal in structure. There is mild calcification.   6. The tricuspid valve is normal in structure.   7. The aortic root is normal in size and structure.   Assessment and Plan:  1.  CAD status post DES to the PLB, BMS to the mid to distal RCA, and DES to the circumflex and distal LAD in 2010.  He does not report any active angina at this time and is on aspirin and Crestor.  No longer on Plavix given interval diagnosis of subdural hematomas as discussed in the previous office note.  2.  Mixed hyperlipidemia with intolerance to Lipitor secondary to myalgias.  He is doing well on Crestor 5 mg daily.  Recheck FLP  to see if further dose adjustments are necessary.  3.  History of subdural hematomas requiring hospitalization back in April.  This did not require surgical intervention.  He was taken off Plavix at that point.  Medication Adjustments/Labs and Tests Ordered: Current medicines are reviewed at length with the patient today.  Concerns regarding medicines are outlined above.   Tests Ordered: Orders Placed This Encounter  Procedures   Lipid panel     Medication Changes: No orders of the defined types were placed in this encounter.   Disposition:  Follow up  6 months.  Signed, Satira Sark, MD, [facility] 12/04/2020 2:27 PM    [place] [facility] [place], [place], [place] [address] Phone: [phone]; Fax: [phone]

## 2020-12-04 NOTE — Patient Instructions (Addendum)
Medication Instructions:  Your physician recommends that you continue on your current medications as directed. Please refer to the Current Medication list given to you today.  Labwork: Your physician recommends that you return for a FASTING lipid profile this week. Please do not eat or drink for at least 8 hours when you have this done. You may take your medications that morning with a sip of water. This can be done at [employer]  Testing/Procedures: none  Follow-Up: Your physician recommends that you schedule a follow-up appointment in: 6 months  Any Other Special Instructions Will Be Listed Below (If Applicable).  If you need a refill on your cardiac medications before your next appointment, please call your pharmacy.

## 2020-12-17 ENCOUNTER — Other Ambulatory Visit: Payer: Self-pay | Admitting: "Endocrinology

## 2020-12-17 ENCOUNTER — Other Ambulatory Visit: Payer: Self-pay | Admitting: Nurse Practitioner

## 2020-12-24 ENCOUNTER — Other Ambulatory Visit: Payer: Self-pay | Admitting: Neurology

## 2020-12-31 DIAGNOSIS — E785 Hyperlipidemia, unspecified: Secondary | ICD-10-CM | POA: Diagnosis not present

## 2020-12-31 DIAGNOSIS — Z79899 Other long term (current) drug therapy: Secondary | ICD-10-CM | POA: Diagnosis not present

## 2021-01-01 LAB — LIPID PANEL
Chol/HDL Ratio: 4.7 ratio (ref 0.0–5.0)
Cholesterol, Total: 156 mg/dL (ref 100–199)
HDL: 33 mg/dL — ABNORMAL LOW (ref >39)
LDL Chol Calc (NIH): 87 mg/dL (ref 0–99)
Triglycerides: 215 mg/dL — ABNORMAL HIGH (ref 0–149)
VLDL Cholesterol Cal: 36 mg/dL (ref 5–40)

## 2021-01-03 ENCOUNTER — Telehealth: Payer: Self-pay | Admitting: *Deleted

## 2021-01-03 ENCOUNTER — Ambulatory Visit (INDEPENDENT_AMBULATORY_CARE_PROVIDER_SITE_OTHER): Payer: BC Managed Care – PPO | Admitting: "Endocrinology

## 2021-01-03 ENCOUNTER — Encounter: Payer: Self-pay | Admitting: "Endocrinology

## 2021-01-03 VITALS — BP 120/76 | HR 96 | Ht 69.0 in | Wt 172.8 lb

## 2021-01-03 DIAGNOSIS — E782 Mixed hyperlipidemia: Secondary | ICD-10-CM | POA: Diagnosis not present

## 2021-01-03 DIAGNOSIS — E559 Vitamin D deficiency, unspecified: Secondary | ICD-10-CM | POA: Diagnosis not present

## 2021-01-03 DIAGNOSIS — E1159 Type 2 diabetes mellitus with other circulatory complications: Secondary | ICD-10-CM | POA: Diagnosis not present

## 2021-01-03 DIAGNOSIS — I10 Essential (primary) hypertension: Secondary | ICD-10-CM

## 2021-01-03 LAB — POCT GLYCOSYLATED HEMOGLOBIN (HGB A1C): HbA1c, POC (controlled diabetic range): 9.7 % — AB (ref 0.0–7.0)

## 2021-01-03 MED ORDER — NOVOLOG FLEXPEN 100 UNIT/ML ~~LOC~~ SOPN: 5.0000 [IU] | PEN_INJECTOR | Freq: Three times a day (TID) | SUBCUTANEOUS | 2 refills | Status: DC

## 2021-01-03 MED ORDER — ROSUVASTATIN CALCIUM 10 MG PO TABS: 10.0000 mg | ORAL_TABLET | Freq: Every day | ORAL | 3 refills | Status: DC

## 2021-01-03 NOTE — Telephone Encounter (Signed)
Patient informed and verbalized understanding of plan. Copy sent to PCP 

## 2021-01-03 NOTE — Telephone Encounter (Signed)
-----   Message from Satira Sark, MD sent at 01/01/2021  8:01 AM EST ----- Results reviewed.  LDL has come down to 87 from 135.  He has been on Crestor 5 mg daily, would see if he is willing to increase this to 10 mg daily to get LDL down lower.

## 2021-01-03 NOTE — Progress Notes (Signed)
01/03/2021  Endocrinology follow-up note  Subjective:    Patient ID: Nicholas [name], male    DOB: September 23, 1960,    Past Medical History:  Diagnosis Date   Anal lesion    Anal papilla/tags - external lesion   Bilateral carpal tunnel syndrome 11/24/2018   Colon polyp    Hyperplastic rectosigmoid polyp 9/08   Coronary atherosclerosis of native coronary artery    DES to PLB, BMS to mid and distal RCA, DES to circumflex and distal LAD, 6/10 (5 stents)   Diabetic peripheral neuropathy ([place]) 11/24/2018   GERD (gastroesophageal reflux disease)    Helicobacter pylori (H. pylori) 09/2009   Treated with helidac   Hiatal hernia    Hyperplastic colon polyp 2008   Mixed hyperlipidemia    Myocardial infarction ([place]) 06/2008   Seizures ([place])    Stroke [facility])    January 2018   Type 2 diabetes mellitus [facility])    Past Surgical History:  Procedure Laterality Date   CHOLECYSTECTOMY     COLLAPSED LUNG     15 years ago   COLONOSCOPY  10/12/2006   Dr. Gala Romney- rectosigmoid polyp s/phot snare polypectomy o/w normal rectum and terminal ileum. hyperplastic polyp on bx   COLONOSCOPY WITH PROPOFOL N/A 09/01/2019   Procedure: COLONOSCOPY WITH PROPOFOL;  Surgeon: Daneil Dolin, MD;  Location: [facility];  Service: Endoscopy;  Laterality: N/A;  8:30am   EP IMPLANTABLE DEVICE N/A 02/12/2016   Procedure: Loop Recorder Insertion;  Surgeon: Thompson Grayer, MD;  Location: [place] CV LAB;  Service: Cardiovascular;  Laterality: N/A;   ESOPHAGOGASTRODUODENOSCOPY  10/12/2006   Dr. Gala Romney- examination of the tubular esophagus revealed no mucosal abnormalities. the EG junction was easily traversed. small hiatal hernia, the gastric mucosa o/w appeared normal. there was no infiltrating process or frank ulcer seen.   implantable loop recorder removal  09/29/2019   MDT Reveal LINQ removed in office by Dr Rayann Heman   TEE WITHOUT CARDIOVERSION N/A 02/12/2016   Procedure: TRANSESOPHAGEAL ECHOCARDIOGRAM (TEE);  Surgeon: Jerline Pain, MD;  [facility] END[facility];  Service: Cardiovascular;  Laterality: N/A;   WIRE IN APEX OF RIGHT LUNG     Since childhood   Social History   Socioeconomic History   Marital status: Married    Spouse name: Not on file   Number of children: Not on file   Years of education: Not on file   Highest education level: Not on file  Occupational History   Occupation: maintenance, parking lots    Employer: SELF-EMPLOYED  Tobacco Use   Smoking status: Every Day    Packs/day: 0.50    Years: 34.00    Pack years: 17.00    Types: Cigarettes    Start date: 04/08/1973   Smokeless tobacco: Never  Vaping Use   Vaping Use: Never used  Substance and Sexual Activity   Alcohol use: No    Alcohol/week: 0.0 standard drinks   Drug use: No   Sexual activity: Yes    Partners: Female    Birth control/protection: Pill  Other Topics Concern   Not on file  Social History Narrative   Drinks Coffee 2 cups daily.  Lives sat home with wife.   Is self employed.  Graduated from High school.     Social Determinants of Health   Financial Resource Strain: Not on file  Food Insecurity: Not on file  Transportation Needs: Not on file  Physical Activity: Not on file  Stress: Not on file  Social Connections:  Not on file   Outpatient Encounter Medications as of 01/03/2021  Medication Sig   insulin aspart (NOVOLOG FLEXPEN) 100 UNIT/ML FlexPen Inject 5-12 Units into the skin 3 (three) times daily with meals.   acetaminophen (TYLENOL) 500 MG tablet Take 500 mg by mouth every 6 (six) hours as needed (for headaches). (Patient not taking: Reported on 12/04/2020)   albuterol (VENTOLIN HFA) 108 (90 Base) MCG/ACT inhaler Inhale 1-2 puffs into the lungs every 6 (six) hours as needed for wheezing or shortness of breath.   aspirin EC 81 MG EC tablet Take 1 tablet (81 mg total) by mouth daily. Swallow whole.   BD PEN NEEDLE NANO U/F 32G X 4 MM MISC USE AS DIRECTED FOUR TIMES DAILY.   Continuous Blood Gluc Receiver  (FREESTYLE LIBRE 2 READER) DEVI As directed   Continuous Blood Gluc Sensor (FREESTYLE LIBRE 14 DAY SENSOR) MISC CHANGE SENSOR EVERY 14 DAYS   gabapentin (NEURONTIN) 100 MG capsule Take 100 mg by mouth daily.    lacosamide (VIMPAT) 50 MG TABS tablet Take 1 tablet (50 mg total) by mouth 2 (two) times daily. (Patient not taking: Reported on 01/03/2021)   LANTUS SOLOSTAR 100 UNIT/ML Solostar Pen INJECT 40 UNITS SUBCUTANEOUSLY ONCE DAILY   nitroGLYCERIN (NITROSTAT) 0.4 MG SL tablet DISSOLVE 1 TABLET UNDER TONGUE EVERY 5 MINUTES UP TO 15 MIN FOR CHESTPAIN. IF NO RELIEF CALL 911. (Patient taking differently: Place 0.4 mg under the tongue every 5 (five) minutes as needed for chest pain.)   nortriptyline (PAMELOR) 10 MG capsule Take 3 capsules (30 mg total) by mouth at bedtime.   pantoprazole (PROTONIX) 40 MG tablet Take 40 mg by mouth daily before breakfast.   rizatriptan (MAXALT) 10 MG tablet Take 1 tablet (10 mg total) by mouth 3 (three) times daily as needed for migraine. (Patient not taking: Reported on 12/04/2020)   rosuvastatin (CRESTOR) 5 MG tablet Take 5 mg by mouth daily.   traMADol (ULTRAM) 50 MG tablet Take 1 tablet (50 mg total) by mouth every 6 (six) hours as needed for moderate pain. (Patient taking differently: Take 50 mg by mouth daily.)   No facility-administered encounter medications on file as of 01/03/2021.   ALLERGIES: Allergies  Allergen Reactions   Keppra [Levetiracetam]     Hallucination   Topamax [Topiramate]     irritable   VACCINATION STATUS:  There is no immunization history on file for this patient.  Diabetes He presents for his follow-up diabetic visit. He has type 2 diabetes mellitus. Onset time: He was diagnosed at approximate age of 46 years. In this particular patient with a history of heavy alcohol use possiblity of pancreatic diabetes is high. His disease course has been worsening. Pertinent negatives for hypoglycemia include no confusion, headaches, pallor,  seizures or speech difficulty. Pertinent negatives for diabetes include no chest pain, no fatigue, no polydipsia, no polyphagia, no polyuria and no weakness. There are no hypoglycemic complications. Symptoms are worsening. Diabetic complications include a CVA and heart disease. Risk factors for coronary artery disease include diabetes mellitus, dyslipidemia, hypertension, male sex, sedentary lifestyle and tobacco exposure. Current diabetic treatment includes oral agent (dual therapy). He is compliant with treatment some of the time. His weight is increasing steadily. He is following a generally unhealthy diet. When asked about meal planning, he reported none. He has not had a previous visit with a dietitian (he did not keep appointment.). He rarely participates in exercise. His home blood glucose trend is increasing steadily. His breakfast blood glucose range  is generally >200 mg/dl. His lunch blood glucose range is generally >200 mg/dl. His dinner blood glucose range is generally >200 mg/dl. His bedtime blood glucose range is generally >200 mg/dl. His overall blood glucose range is >200 mg/dl. (Nicholas [name] returns with loss of control.  Due to his lack of motivation and disability, he took himself off of NovoLog prior to his last visit.  He is CGM was reviewed and AGP report shows 26% time range, 74% above range.  He has no sustained hypoglycemia.  His point-of-care A1c is 9.7% worsening from 7.5% during his last visit.   ) Eye exam is current.  Hyperlipidemia This is a chronic problem. The current episode started more than 1 year ago. Condition status: He was convinced to take Crestor during his last visit, returns with significant improvement in his LDL from 135 down to 87, triglycerides from 396-215. Exacerbating diseases include diabetes. Pertinent negatives include no chest pain, myalgias or shortness of breath. Current antihyperlipidemic treatment includes statins. Risk factors for coronary artery disease  include dyslipidemia, diabetes mellitus, hypertension and a sedentary lifestyle.  Hypertension This is a chronic problem. The current episode started more than 1 year ago. The problem is controlled. Pertinent negatives include no chest pain, headaches, neck pain, palpitations or shortness of breath. Risk factors for coronary artery disease include diabetes mellitus, dyslipidemia and smoking/tobacco exposure. Past treatments include beta blockers. Hypertensive end-organ damage includes CAD/MI and CVA.   Review of Systems  Constitutional:  Negative for fatigue and unexpected weight change.  HENT:  Negative for dental problem, mouth sores and trouble swallowing.   Eyes:  Negative for visual disturbance.  Respiratory:  Negative for cough, choking, chest tightness, shortness of breath and wheezing.   Cardiovascular:  Negative for chest pain, palpitations and leg swelling.  Gastrointestinal:  Negative for abdominal distention, abdominal pain, constipation, diarrhea, nausea and vomiting.  Endocrine: Negative for polydipsia, polyphagia and polyuria.  Genitourinary:  Negative for dysuria, flank pain, hematuria and urgency.  Musculoskeletal:  Negative for back pain, gait problem, myalgias and neck pain.  Skin:  Negative for pallor, rash and wound.  Neurological:  Negative for seizures, syncope, speech difficulty, weakness, numbness and headaches.  Psychiatric/Behavioral:  Negative for confusion and dysphoric mood.    Objective:    BP 120/76   Pulse 96   Ht 5\' 9"  (1.753 m)   Wt 172 lb 12.8 oz (78.4 kg)   BMI 25.52 kg/m   Wt Readings from Last 3 Encounters:  01/03/21 172 lb 12.8 oz (78.4 kg)  12/04/20 170 lb (77.1 kg)  11/07/20 168 lb (76.2 kg)    Physical Exam Constitutional:      General: He is not in acute distress.    Appearance: He is well-developed.  HENT:     Head: Normocephalic and atraumatic.  Neck:     Thyroid: No thyromegaly.     Trachea: No tracheal deviation.  Cardiovascular:      Rate and Rhythm: Normal rate.     Pulses:          Dorsalis pedis pulses are 1+ on the right side and 1+ on the left side.       Posterior tibial pulses are 1+ on the right side and 1+ on the left side.     Heart sounds: S1 normal and S2 normal. No murmur heard.   No gallop.  Pulmonary:     Effort: Pulmonary effort is normal. No respiratory distress.     Breath sounds: No wheezing.  Abdominal:     General: There is no distension.     Tenderness: There is no abdominal tenderness. There is no guarding.  Musculoskeletal:     Right shoulder: No swelling or deformity.     Cervical back: Normal range of motion and neck supple.  Skin:    General: Skin is warm and dry.     Findings: No rash.     Nails: There is no clubbing.  Neurological:     Mental Status: He is alert and oriented to person, place, and time.     Cranial Nerves: No cranial nerve deficit.     Sensory: No sensory deficit.     Gait: Gait normal.  Psychiatric:        Speech: Speech normal.        Behavior: Behavior is cooperative.     Comments:  passive and unconcerned affect.    Results for orders placed or performed in visit on 01/03/21  HgB A1c  Result Value Ref Range   Hemoglobin A1C     HbA1c POC (<> result, manual entry)     HbA1c, POC (prediabetic range)     HbA1c, POC (controlled diabetic range) 9.7 (A) 0.0 - 7.0 %   Diabetic Labs (most recent): Lab Results  Component Value Date   HGBA1C 9.7 (A) 01/03/2021   HGBA1C 7.5 (A) 09/03/2020   HGBA1C 8.0 (H) 05/09/2020   Lipid Panel     Component Value Date/Time   CHOL 156 12/31/2020 0812   TRIG 215 (H) 12/31/2020 0812   HDL 33 (L) 12/31/2020 0812   CHOLHDL 4.7 12/31/2020 0812   CHOLHDL 7.7 (H) 08/30/2020 0741   VLDL 20 05/11/2020 0224   LDLCALC 87 12/31/2020 0812   LDLCALC 139 (H) 08/30/2020 0741     Assessment & Plan:   1. Type 2 diabetes mellitus with vascular disease (HCC) -His diabetes is  complicated by coronary artery disease and patient  remains at a high risk for more acute and chronic complications of diabetes which include CAD, CVA, CKD, retinopathy, and neuropathy. These are all discussed in detail with the patient.  Nicholas [name] returns with loss of control.  Due to his lack of motivation and disability, he took himself off of NovoLog prior to his last visit.  He is CGM was reviewed and AGP report shows 26% time range, 74% above range.  He has no sustained hypoglycemia.  His point-of-care A1c is 9.7% worsening from 7.5% during his last visit.    - I have re-counseled the patient on diet management  by adopting a carbohydrate restricted / protein rich  Diet.   - Suggestion is made for him to avoid simple carbohydrates  from his diet including Cakes, Sweet Desserts, Ice Cream, Soda (diet and regular), Sweet Tea, Candies, Chips, Cookies, Store Bought Juices, Alcohol in Excess of  1-2 drinks a day, Artificial Sweeteners,  Coffee Creamer, and "Sugar-free" Products, Lemonade. This will help patient to have more stable blood glucose profile and potentially avoid unintended weight gain.   - Patient is advised to stick to a routine mealtimes to eat 3 meals  a day and avoid unnecessary snacks ( to snack only to correct hypoglycemia).  - I have approached patient with the following individualized plan to manage diabetes and patient agrees.  - Proper use of insulin is the exclusive choice he has to treat his diabetes, currently doing well only with basal insulin.  He did not comply with multiple daily injections of insulin  Previously and this led to loss of control of diabetes.  -He is reapproached for multiple daily injections of insulin and he accepts his [place].  He is advised to continue Lantus 40 units nightly, reintroduce NovoLog at 5 units 3 times daily AC for Premeal blood glucose above 90 mg/Dl.  -He is advised to continue to utilize his CGM to measure blood glucose appropriately.  - He is not the right candidate for Invokana  nor incretintherapy.  -Target numbers for A1c, LDL, HDL, Triglycerides,  were discussed in detail.   2) BP/HTN:  His blood pressure is controlled to target.  He is advised to continue his current medications including beta blockers.  He has marginal blood pressure, he will not tolerate additional ACE inhibitor's.     3) Lipids/HPL: His lipid panel shows significant improvement.  LDL dropped from 135-87, triglycerides dropped from 396-215.    He is advised to continue Crestor 5 mg p.o. daily.  He is benefiting from this intervention.     4)  Weight/Diet:   His BMI is 25.5-  He is not a candidate for weight loss.  He is  following with  CDE consult, exercise, and carbohydrates information provided.    5) Chronic Care/Health Maintenance:  -Patient is  Statin medications and encouraged to continue to follow up with Ophthalmology, Podiatrist at least yearly or according to recommendations, and advised to  quit Smoking (tragically he resumed smoking after he quit briefly during his diagnosis of CVA ). I have recommended yearly flu vaccine and pneumonia vaccination at least every 5 years; moderate intensity exercise for up to 150 minutes weekly; and  sleep for at least 7 hours a day.  The patient was counseled on the dangers of tobacco use, and was advised to quit.  Reviewed strategies to maximize success, including removing cigarettes and smoking materials from environment.  He recently had normal ABI, will be repeated in November 2026,  or sooner if needed. I advised patient to maintain close follow up with his PCP for primary care needs.   I spent 42 minutes in the care of the patient today including review of labs from [place] [facility], Lipids, Thyroid Function, Hematology (current and previous including abstractions from other facilities); face-to-face time discussing  his blood glucose readings/logs, discussing hypoglycemia and hyperglycemia episodes and symptoms, medications doses, his options of  short and long term treatment based on the latest standards of care / guidelines;  discussion about incorporating lifestyle medicine;  and documenting the encounter.    Please refer to Patient Instructions for Blood Glucose Monitoring and Insulin/Medications Dosing Guide"  in media tab for additional information. Please  also refer to " Patient Self Inventory" in the Media  tab for reviewed elements of pertinent patient history.  Nicholas [name] participated in the discussions, expressed understanding, and voiced agreement with the above plans.  All questions were answered to his satisfaction. he is encouraged to contact clinic should he have any questions or concerns prior to his return visit.     Follow up plan: Return in about 4 weeks (around 01/31/2021) for F/U with Meter and Logs Only - no Labs.  Glade Lloyd, MD Phone: [phone]  Fax: [phone]  This note was partially dictated with voice recognition software. Similar sounding words can be transcribed inadequately or may not  be corrected upon review.  01/03/2021, 12:38 PM

## 2021-01-03 NOTE — Patient Instructions (Signed)

## 2021-01-08 ENCOUNTER — Other Ambulatory Visit: Payer: Self-pay

## 2021-01-08 ENCOUNTER — Ambulatory Visit (INDEPENDENT_AMBULATORY_CARE_PROVIDER_SITE_OTHER): Payer: BC Managed Care – PPO | Admitting: Dermatology

## 2021-01-08 ENCOUNTER — Encounter: Payer: Self-pay | Admitting: Dermatology

## 2021-01-08 DIAGNOSIS — L821 Other seborrheic keratosis: Secondary | ICD-10-CM

## 2021-01-08 DIAGNOSIS — L570 Actinic keratosis: Secondary | ICD-10-CM

## 2021-01-08 DIAGNOSIS — Z1283 Encounter for screening for malignant neoplasm of skin: Secondary | ICD-10-CM | POA: Diagnosis not present

## 2021-01-08 DIAGNOSIS — L219 Seborrheic dermatitis, unspecified: Secondary | ICD-10-CM | POA: Diagnosis not present

## 2021-01-08 MED ORDER — CLOBETASOL PROPIONATE 0.05 % EX FOAM: Freq: Two times a day (BID) | CUTANEOUS | 3 refills | Status: DC

## 2021-01-08 NOTE — Patient Instructions (Signed)
OTC Hydrocortisone Cream this can be used on the face.

## 2021-01-10 ENCOUNTER — Ambulatory Visit: Payer: BC Managed Care – PPO | Admitting: Cardiology

## 2021-01-31 ENCOUNTER — Encounter: Payer: Self-pay | Admitting: Dermatology

## 2021-01-31 NOTE — Progress Notes (Signed)
° °  New Patient   Subjective  Nicholas [name] is a 61 y.o. male who presents for the following: Skin Problem (As per wife, spots on the back, face and extremities that need to be seen. Pt states that there is no discomfort. No personal or family hx of skin cancer or non melanoma skin cancer).  Annual skin examination, wife noticed several spots that concerned her Location:  Duration:  Quality:  Associated Signs/Symptoms: Modifying Factors:  Severity:  Timing: Context:    The following portions of the chart were reviewed this encounter and updated as appropriate:  Tobacco   Allergies   Meds   Problems   Med Hx   Surg Hx   Fam Hx       Objective  Well appearing patient in no apparent distress; mood and affect are within normal limits. Mid Back Full body skin exam.  No atypical pigmented lesions or nonmelanoma skin cancer  Left Ear, Right Ear, Scalp Patchy pink inflammation with some scale  Right Hand - Posterior Hornlike 4 mm pink crust  Left Popliteal Fossa, Mid Back (2), Right Thigh - Anterior Half dozen noninflamed flattopped textured brown 3 to 7 mm papules    A full examination was performed including scalp, head, eyes, ears, nose, lips, neck, chest, axillae, abdomen, back, buttocks, bilateral upper extremities, bilateral lower extremities, hands, feet, fingers, toes, fingernails, and toenails. All findings within normal limits unless otherwise noted below.   Assessment & Plan  Screening for malignant neoplasm of skin Mid Back  Yearly skin exams.  Urged to self examine with spouse twice annually.  Continued ultraviolet protection.  Encouraged to assess tobacco abuse.  Seborrheic dermatitis Left Ear; Right Ear; Scalp  OTC hydrocortisone ointment on face.  May use nightly for 2 to 3 weeks.  Contact me if there is no improvement.  clobetasol (OLUX) 0.05 % topical foam - Left Ear, Right Ear, Scalp Apply topically 2 (two) times daily.  AK (actinic keratosis) Right Hand  - Posterior  Destruction of lesion - Right Hand - Posterior Complexity: simple   Destruction method: cryotherapy   Informed consent: discussed and consent obtained   Timeout:  patient name, date of birth, surgical site, and procedure verified Lesion destroyed using liquid nitrogen: Yes   Cryotherapy cycles:  3 Outcome: patient tolerated procedure well with no complications   Post-procedure details: wound care instructions given    Seborrheic keratosis (4) Left Popliteal Fossa; Right Thigh - Anterior; Mid Back (2)  Leave if stable

## 2021-02-11 ENCOUNTER — Encounter: Payer: Self-pay | Admitting: "Endocrinology

## 2021-02-11 ENCOUNTER — Ambulatory Visit (INDEPENDENT_AMBULATORY_CARE_PROVIDER_SITE_OTHER): Payer: BC Managed Care – PPO | Admitting: "Endocrinology

## 2021-02-11 ENCOUNTER — Other Ambulatory Visit: Payer: Self-pay

## 2021-02-11 VITALS — BP 116/76 | HR 96 | Ht 69.0 in | Wt 170.4 lb

## 2021-02-11 DIAGNOSIS — E1159 Type 2 diabetes mellitus with other circulatory complications: Secondary | ICD-10-CM | POA: Diagnosis not present

## 2021-02-11 DIAGNOSIS — E559 Vitamin D deficiency, unspecified: Secondary | ICD-10-CM

## 2021-02-11 DIAGNOSIS — I10 Essential (primary) hypertension: Secondary | ICD-10-CM

## 2021-02-11 DIAGNOSIS — E782 Mixed hyperlipidemia: Secondary | ICD-10-CM | POA: Diagnosis not present

## 2021-02-11 MED ORDER — NOVOLOG MIX 70/30 FLEXPEN (70-30) 100 UNIT/ML ~~LOC~~ SUPN: 30.0000 [IU] | PEN_INJECTOR | Freq: Two times a day (BID) | SUBCUTANEOUS | 2 refills | Status: DC

## 2021-02-11 NOTE — Progress Notes (Signed)
02/11/2021  Endocrinology follow-up note  Subjective:    Patient ID: Nicholas [name], male    DOB: [birth year],    Past Medical History:  Diagnosis Date   Anal lesion    Anal papilla/tags - external lesion   Bilateral carpal tunnel syndrome 11/24/2018   Colon polyp    Hyperplastic rectosigmoid polyp 9/08   Coronary atherosclerosis of native coronary artery    DES to PLB, BMS to mid and distal RCA, DES to circumflex and distal LAD, 6/10 (5 stents)   Diabetic peripheral neuropathy ([place]) 11/24/2018   GERD (gastroesophageal reflux disease)    Helicobacter pylori (H. pylori) 09/2009   Treated with helidac   Hiatal hernia    Hyperplastic colon polyp 2008   Mixed hyperlipidemia    Myocardial infarction ([place]) 06/2008   Seizures ([place])    Stroke [facility])    January 2018   Type 2 diabetes mellitus [facility])    Past Surgical History:  Procedure Laterality Date   CHOLECYSTECTOMY     COLLAPSED LUNG     15 years ago   COLONOSCOPY  10/12/2006   Dr. Gala Romney- rectosigmoid polyp s/phot snare polypectomy o/w normal rectum and terminal ileum. hyperplastic polyp on bx   COLONOSCOPY WITH PROPOFOL N/A 09/01/2019   Procedure: COLONOSCOPY WITH PROPOFOL;  Surgeon: Daneil Dolin, MD;  Location: [facility];  Service: Endoscopy;  Laterality: N/A;  8:30am   EP IMPLANTABLE DEVICE N/A 02/12/2016   Procedure: Loop Recorder Insertion;  Surgeon: Thompson Grayer, MD;  Location: [place] CV LAB;  Service: Cardiovascular;  Laterality: N/A;   ESOPHAGOGASTRODUODENOSCOPY  10/12/2006   Dr. Gala Romney- examination of the tubular esophagus revealed no mucosal abnormalities. the EG junction was easily traversed. small hiatal hernia, the gastric mucosa o/w appeared normal. there was no infiltrating process or frank ulcer seen.   implantable loop recorder removal  09/29/2019   MDT Reveal LINQ removed in office by Dr Rayann Heman   TEE WITHOUT CARDIOVERSION N/A 02/12/2016   Procedure: TRANSESOPHAGEAL ECHOCARDIOGRAM (TEE);  Surgeon: Jerline Pain, MD;  Location: [facility] ENDOSCOPY;  Service: Cardiovascular;  Laterality: N/A;   WIRE IN APEX OF RIGHT LUNG     Since childhood   Social History   Socioeconomic History   Marital status: Married    Spouse name: Not on file   Number of children: Not on file   Years of education: Not on file   Highest education level: Not on file  Occupational History   Occupation: maintenance, parking lots    Employer: SELF-EMPLOYED  Tobacco Use   Smoking status: Every Day    Packs/day: 0.50    Years: 34.00    Pack years: 17.00    Types: Cigarettes    Start date: 04/08/1973   Smokeless tobacco: Never  Vaping Use   Vaping Use: Never used  Substance and Sexual Activity   Alcohol use: No    Alcohol/week: 0.0 standard drinks   Drug use: No   Sexual activity: Yes    Partners: Female    Birth control/protection: Pill  Other Topics Concern   Not on file  Social History Narrative   Drinks Coffee 2 cups daily.  Lives sat home with wife.   Is self employed.  Graduated from High school.     Social Determinants of Health   Financial Resource Strain: Not on file  Food Insecurity: Not on file  Transportation Needs: Not on file  Physical Activity: Not on file  Stress: Not on file  Social Connections:  Not on file   Outpatient Encounter Medications as of 02/11/2021  Medication Sig   insulin aspart protamine - aspart (NOVOLOG MIX 70/30 FLEXPEN) (70-30) 100 UNIT/ML FlexPen Inject 30 Units into the skin 2 (two) times daily with a meal.   acetaminophen (TYLENOL) 500 MG tablet Take 500 mg by mouth every 6 (six) hours as needed (for headaches).   albuterol (VENTOLIN HFA) 108 (90 Base) MCG/ACT inhaler Inhale 1-2 puffs into the lungs every 6 (six) hours as needed for wheezing or shortness of breath.   aspirin EC 81 MG EC tablet Take 1 tablet (81 mg total) by mouth daily. Swallow whole.   BD PEN NEEDLE NANO U/F 32G X 4 MM MISC USE AS DIRECTED FOUR TIMES DAILY.   clobetasol (OLUX) 0.05 % topical foam Apply  topically 2 (two) times daily.   Continuous Blood Gluc Receiver (FREESTYLE LIBRE 2 READER) DEVI As directed   Continuous Blood Gluc Sensor (FREESTYLE LIBRE 14 DAY SENSOR) MISC CHANGE SENSOR EVERY 14 DAYS   gabapentin (NEURONTIN) 100 MG capsule Take 100 mg by mouth daily.    lacosamide (VIMPAT) 50 MG TABS tablet Take 1 tablet (50 mg total) by mouth 2 (two) times daily. (Patient not taking: Reported on 02/11/2021)   nitroGLYCERIN (NITROSTAT) 0.4 MG SL tablet DISSOLVE 1 TABLET UNDER TONGUE EVERY 5 MINUTES UP TO 15 MIN FOR CHESTPAIN. IF NO RELIEF CALL 911. (Patient taking differently: Place 0.4 mg under the tongue every 5 (five) minutes as needed for chest pain.)   nortriptyline (PAMELOR) 10 MG capsule Take 3 capsules (30 mg total) by mouth at bedtime.   pantoprazole (PROTONIX) 40 MG tablet Take 40 mg by mouth daily before breakfast.   rizatriptan (MAXALT) 10 MG tablet Take 1 tablet (10 mg total) by mouth 3 (three) times daily as needed for migraine.   rosuvastatin (CRESTOR) 10 MG tablet Take 1 tablet (10 mg total) by mouth daily.   traMADol (ULTRAM) 50 MG tablet Take 1 tablet (50 mg total) by mouth every 6 (six) hours as needed for moderate pain. (Patient taking differently: Take 50 mg by mouth daily.)   [DISCONTINUED] insulin aspart (NOVOLOG FLEXPEN) 100 UNIT/ML FlexPen Inject 5-12 Units into the skin 3 (three) times daily with meals.   [DISCONTINUED] LANTUS SOLOSTAR 100 UNIT/ML Solostar Pen INJECT 40 UNITS SUBCUTANEOUSLY ONCE DAILY (Patient taking differently: at bedtime.)   No facility-administered encounter medications on file as of 02/11/2021.   ALLERGIES: Allergies  Allergen Reactions   Keppra [Levetiracetam]     Hallucination   Topamax [Topiramate]     irritable   VACCINATION STATUS:  There is no immunization history on file for this patient.  Diabetes He presents for his follow-up diabetic visit. He has type 2 diabetes mellitus. Onset time: He was diagnosed at approximate age of 55  years. In this particular patient with a history of heavy alcohol use possiblity of pancreatic diabetes is high. His disease course has been improving. Pertinent negatives for hypoglycemia include no confusion, headaches, pallor, seizures or speech difficulty. Pertinent negatives for diabetes include no chest pain, no fatigue, no polydipsia, no polyphagia, no polyuria and no weakness. There are no hypoglycemic complications. Symptoms are improving. Diabetic complications include a CVA and heart disease. Risk factors for coronary artery disease include diabetes mellitus, dyslipidemia, hypertension, male sex, sedentary lifestyle and tobacco exposure. Current diabetic treatment includes oral agent (dual therapy). He is compliant with treatment some of the time. His weight is fluctuating minimally. He is following a generally unhealthy diet. When  asked about meal planning, he reported none. He has not had a previous visit with a dietitian (he did not keep appointment.). He rarely participates in exercise. His home blood glucose trend is decreasing steadily. His breakfast blood glucose range is generally 180-200 mg/dl. His lunch blood glucose range is generally 180-200 mg/dl. His dinner blood glucose range is generally 180-200 mg/dl. His bedtime blood glucose range is generally 180-200 mg/dl. His overall blood glucose range is 180-200 mg/dl. (Mr. Reinard presents with slightly better glycemic profile.  His AGP shows 44% time range, 46% above range.  No hypoglycemia.  His recent A1c was 9.7%.   His current average blood glucose is 191 mg per DL for the last 14 days. ) Eye exam is current.  Hyperlipidemia This is a chronic problem. The current episode started more than 1 year ago. Condition status: He was convinced to take Crestor during his last visit, returns with significant improvement in his LDL from 135 down to 87, triglycerides from 396-215. Exacerbating diseases include diabetes. Pertinent negatives include no  chest pain, myalgias or shortness of breath. Current antihyperlipidemic treatment includes statins. Risk factors for coronary artery disease include dyslipidemia, diabetes mellitus, hypertension and a sedentary lifestyle.  Hypertension This is a chronic problem. The current episode started more than 1 year ago. The problem is controlled. Pertinent negatives include no chest pain, headaches, neck pain, palpitations or shortness of breath. Risk factors for coronary artery disease include diabetes mellitus, dyslipidemia and smoking/tobacco exposure. Past treatments include beta blockers. Hypertensive end-organ damage includes CAD/MI and CVA.   Review of Systems  Constitutional:  Negative for fatigue and unexpected weight change.  HENT:  Negative for dental problem, mouth sores and trouble swallowing.   Eyes:  Negative for visual disturbance.  Respiratory:  Negative for cough, choking, chest tightness, shortness of breath and wheezing.   Cardiovascular:  Negative for chest pain, palpitations and leg swelling.  Gastrointestinal:  Negative for abdominal distention, abdominal pain, constipation, diarrhea, nausea and vomiting.  Endocrine: Negative for polydipsia, polyphagia and polyuria.  Genitourinary:  Negative for dysuria, flank pain, hematuria and urgency.  Musculoskeletal:  Negative for back pain, gait problem, myalgias and neck pain.  Skin:  Negative for pallor, rash and wound.  Neurological:  Negative for seizures, syncope, speech difficulty, weakness, numbness and headaches.  Psychiatric/Behavioral:  Negative for confusion and dysphoric mood.    Objective:    BP 116/76    Pulse 96    Ht 5\' 9"  (1.753 m)    Wt 170 lb 6.4 oz (77.3 kg)    BMI 25.16 kg/m   Wt Readings from Last 3 Encounters:  02/11/21 170 lb 6.4 oz (77.3 kg)  01/03/21 172 lb 12.8 oz (78.4 kg)  12/04/20 170 lb (77.1 kg)      Results for orders placed or performed in visit on 01/03/21  HgB A1c  Result Value Ref Range    Hemoglobin A1C     HbA1c POC (<> result, manual entry)     HbA1c, POC (prediabetic range)     HbA1c, POC (controlled diabetic range) 9.7 (A) 0.0 - 7.0 %   Diabetic Labs (most recent): Lab Results  Component Value Date   HGBA1C 9.7 (A) 01/03/2021   HGBA1C 7.5 (A) 09/03/2020   HGBA1C 8.0 (H) 05/09/2020   Lipid Panel     Component Value Date/Time   CHOL 156 12/31/2020 0812   TRIG 215 (H) 12/31/2020 0812   HDL 33 (L) 12/31/2020 0812   CHOLHDL 4.7 12/31/2020 9629  CHOLHDL 7.7 (H) 08/30/2020 0741   VLDL 20 05/11/2020 0224   LDLCALC 87 12/31/2020 0812   LDLCALC 139 (H) 08/30/2020 0741     Assessment & Plan:   1. Type 2 diabetes mellitus with vascular disease (HCC) -His diabetes is  complicated by coronary artery disease and patient remains at a high risk for more acute and chronic complications of diabetes which include CAD, CVA, CKD, retinopathy, and neuropathy. These are all discussed in detail with the patient.  Mr. Linsey presents with slightly better glycemic profile.  His AGP shows 44% time range, 46% above range.  No hypoglycemia.  His recent A1c was 9.7%.   His current average blood glucose is 191 mg per DL for the last 14 days.  - I have re-counseled the patient on diet management  by adopting a carbohydrate restricted / protein rich  Diet.   - Suggestion is made for him to avoid simple carbohydrates  from his diet including Cakes, Sweet Desserts, Ice Cream, Soda (diet and regular), Sweet Tea, Candies, Chips, Cookies, Store Bought Juices, Alcohol in Excess of  1-2 drinks a day, Artificial Sweeteners,  Coffee Creamer, and "Sugar-free" Products, Lemonade. This will help patient to have more stable blood glucose profile and potentially avoid unintended weight gain.   - Patient is advised to stick to a routine mealtimes to eat 3 meals  a day and avoid unnecessary snacks ( to snack only to correct hypoglycemia).  - I have approached patient with the following individualized  plan to manage diabetes and patient agrees.  - Proper use of insulin is the exclusive choice he has to treat his diabetes, currently having trouble following MDI.  He will be switched to premixed insulin to use twice a day to help him achieve better glycemic control.    He is advised to discontinue Lantus.  He is advised to initiate NovoLog 70/30 30 units with breakfast and 30 units with supper associated with monitoring of blood glucose at least 4 times a day-before meals and at bedtime.   -He is advised to continue to utilize his CGM to measure blood glucose appropriately.  - He is not the right candidate for Invokana nor incretintherapy.  -Target numbers for A1c, LDL, HDL, Triglycerides,  were discussed in detail.   2) BP/HTN:  -His blood pressure is controlled to target.  He is advised to continue his current medications including beta blockers.  He has marginal blood pressure, he will not tolerate additional ACE inhibitor's.     3) Lipids/HPL: His lipid panel shows significant improvement.  LDL improved to 87 from 135.  His triglycerides are 215.  He is advised to continue Crestor 5 mg p.o. daily.  Side effects and precautions discussed with him.   4)  Weight/Diet:   His BMI is 25.1-  He is not a candidate for weight loss.  He is  following with  CDE consult, exercise, and carbohydrates information provided.    5) Chronic Care/Health Maintenance:  -Patient is  Statin medications and encouraged to continue to follow up with Ophthalmology, Podiatrist at least yearly or according to recommendations, and advised to  quit Smoking (tragically he resumed smoking after he quit briefly during his diagnosis of CVA ). I have recommended yearly flu vaccine and pneumonia vaccination at least every 5 years; moderate intensity exercise for up to 150 minutes weekly; and  sleep for at least 7 hours a day.  The patient was counseled on the dangers of tobacco use, and was  advised to quit.  Reviewed  strategies to maximize success, including removing cigarettes and smoking materials from environment.  He recently had normal ABI, will be repeated in November 2026,  or sooner if needed. I advised patient to maintain close follow up with his PCP for primary care needs.   I spent 44 minutes in the care of the patient today including review of labs from [place], Lipids, Thyroid Function, Hematology (current and previous including abstractions from other facilities); face-to-face time discussing  his blood glucose readings/logs, discussing hypoglycemia and hyperglycemia episodes and symptoms, medications doses, his options of short and long term treatment based on the latest standards of care / guidelines;  discussion about incorporating lifestyle medicine;  and documenting the encounter.    Please refer to Patient Instructions for Blood Glucose Monitoring and Insulin/Medications Dosing Guide"  in media tab for additional information. Please  also refer to " Patient Self Inventory" in the Media  tab for reviewed elements of pertinent patient history.  Nicholas [name] participated in the discussions, expressed understanding, and voiced agreement with the above plans.  All questions were answered to his satisfaction. he is encouraged to contact clinic should he have any questions or concerns prior to his return visit.      Follow up plan: Return in about 9 weeks (around 04/15/2021) for Bring Meter and Logs- A1c in Office.  Glade Lloyd, MD Phone: [phone]  Fax: [phone]  This note was partially dictated with voice recognition software. Similar sounding words can be transcribed inadequately or may not  be corrected upon review.  02/11/2021, 8:46 AM

## 2021-02-11 NOTE — Patient Instructions (Signed)

## 2021-02-14 ENCOUNTER — Encounter: Payer: Self-pay | Admitting: "Endocrinology

## 2021-02-18 ENCOUNTER — Other Ambulatory Visit: Payer: Self-pay | Admitting: Psychiatry

## 2021-02-18 ENCOUNTER — Telehealth: Payer: Self-pay | Admitting: Family Medicine

## 2021-02-18 MED ORDER — DIVALPROEX SODIUM 250 MG PO DR TAB: DELAYED_RELEASE_TABLET | ORAL | 0 refills | Status: DC

## 2021-02-18 NOTE — Telephone Encounter (Signed)
I sent in a 10 day Depakote taper to his pharmacy to help break his current headache. He can take 2 pills for 5 days, then 1 pill for 5 days, then stop.

## 2021-02-18 NOTE — Telephone Encounter (Signed)
Called Nicholas [name] back to further discuss. He took rizatriptan Sat afternoon. No relief. Repeated dose 2 hr later. Got some relief Sunday morning. Suddenly came back around 3pm Sunday and took another rizatriptan.  Repeated in 2 hr. This am around 2:30a, not feeling well. Took rizatriptan.  Helped some but on his way to work, migraine returned and was severe. He has tried OTC Tylenol once on Sat, ineffective. Does think he missed nortriptyline once night. Unsure if this sparked episode. Still taking nortriptyline as prescribed. Confirmed he stopped Vimpat as discussed at last visit. Doing well. Aware AL,NP out today and tomorrow. I will send to [facility], Dr. Billey Gosling to review to see if there is alternative treatment she would recommend.

## 2021-02-18 NOTE — Telephone Encounter (Signed)
Called and spoke with wife. Relayed Dr. Georgina Peer recommendation. She is agreeable with plan. Will pick up rx for him to start today. They will call back if any further questions/concerns.

## 2021-02-18 NOTE — Telephone Encounter (Signed)
Pt's wife, Brodan Grewell, pt has migraine since Saturday. Has taken rizatriptan (MAXALT) 10 MG tablet 2 tablets on Saturday, Sunday 2 tablets a couple of hours apart and 1 tablet this  morning at 2 am. Pt gets temporary relief of the migraine. Would like a call from the nurse to discuss if there is something stronger or how can he get relief.

## 2021-03-14 NOTE — Patient Instructions (Signed)
Below is our plan:  We will continue nortriptyline 30mg  daily at bedtime. We will try Ubrelvy for abortive therapy. Discontinue rizatriptan. Please take 1 tablet at onset of headache. May take 1 additional tablet in 2 hours if needed. Do not take more than 2 tablets in 24 hours or more than 8 in a month.   Keep an eye on your pulse. Try to increase your water intake.   Please make sure you are staying well hydrated. I recommend 50-60 ounces daily. Well balanced diet and regular exercise encouraged. Consistent sleep schedule with 6-8 hours recommended.   Please continue follow up with care team as directed.   Follow up with me in 6 months   You may receive a survey regarding today's visit. I encourage you to leave honest feed back as I do use this information to improve patient care. Thank you for seeing me today!

## 2021-03-14 NOTE — Progress Notes (Signed)
Chief Complaint  Patient presents with   Follow-up    Pt with wife, rm 53. Pt states there has been overall improvement in migraines since last seen. He has been having some mild headaches ? Once a week and only 1 migraine since last visit. Doesn't feel the rizatriptan is helping when the headache does come on. When he had the depakote he found the lower dose worked better for him than the higher dose. There was some hallucinations with the higher dose. Was unable to tolerate. Still has pressure with sneezing,bending over or coughing     HISTORY OF PRESENT ILLNESS:  03/18/21 ALL:  Wille Glaser returns for migraines follow up. He continues nortriptyline 30mg  daily at bedtime and rizatriptan as needed. He feels that headaches are usually well managed. He has had milder tension type headaches about 4 times a month. He has taken rizatriptan about 10 times over the past 5 months to avoid migraine. He had one intractable migraine 1/23 and started on divalproex taper. He took divalproex 500mg  for two days and had visual disturbances (zig zag lines, halos). Symptoms improved with decreased dose to 250mg  daily. Headache was aborted with taper. He feels that he has been doing pretty well, since. He admits that he does not drink water. He usually drinks coffee or green tea.   He weaned Vimpat as directed. He denies stroke or seizure symptoms. He is taking asa 81mg  and rosuvastatin 10mg  daily (increased in 12/2020). A1C was 9.5 in 12/2020. He is taking Humalog 75/25 30 units twice daily. LDL was 89, triglycerides were 215. He is followed by PCP and endo regularly.   11/07/2020 ALL:  SAAHAS HIDROGO is a 61 y.o. male here today for follow up for headaches and subdural hematoma. Follow up CT reassuring for resolving subdural 06/2020. He continues Vimpat 500mg  BID for concerns of possible seizure like activity most likely complicated migraine. No obvious seizure activity. He was started on topiramate for complex  migraines but could not tolerate and was switched to nortriptyline. He reports headaches are significantly better. He has had two migraines that he was able to abort with rizatriptan, however, has not had any significant migraines in a couple of months. He titrated nortriptyline to 30mg  about 4 weeks ago and is tolerating well. May take Tylenol 1-2 times a week for mild headaches. He is feeling well, today. He continues rosuvastatin and asa 81mg  for stroke prevention.    HISTORY (copied from Dr Jannifer Franklin' previous note)  Mr. Gowan is a 61 year old right-handed white male with a history of cerebrovascular disease with a prior small left occipital stroke in the past.  The patient has history of coronary artery disease, diabetes, and ongoing tobacco abuse.  He continues to smoke a pack of cigarettes daily.  He has gastroesophageal reflux disease.  The patient was admitted to the hospital on 09 May 2020 with a visual disturbance, headache, and some confusion/aphasia.  The patient reported that in February 2022 that he bumped his head when he fell and hit the left frontotemporal area of his head on a tree.  The patient did have some minor headaches afterwards but this was not an issue for him.  In the third week of March 2022, he went skeet shooting and noted that when he fired a shotgun that he would get an uncomfortable sensation in the head, if he did not use this shotgun, the sensation did not occur.  On the April 13 admission, he presented with some tunnel  vision issues, he had visual disturbance on both visual fields in the periphery and had preservation of center vision.  He had a severe headache, and he also appeared to be confused, he was trying to talk but had garbled speech that was not understandable.  When he went to the hospital, CT and MRI evaluation demonstrated bilateral small subdural hematoma.  No stroke was noted.  The patient was taken off of Plavix but kept on aspirin.  He was transiently treated  with Keppra but was taken off the medication upon discharge.  The patient returned to the hospital on 21 May 2020 with some headache and slight confusion but no visual disturbance.  The patient at that point was placed on Vimpat and has remained on 50 mg twice daily.  The patient has had some minor headaches since coming out of the hospital but no severe headaches or visual disturbance or aphasia has been noted.  The patient reported no numbness or weakness of the face, arms, or legs.  He denied any gait disturbance or difficulty controlling the bowels or the bladder.  He does have some minor neck discomfort.  The patient comes to this office for further evaluation.  He remains on aspirin, off the Plavix.  He has not been operating a motor vehicle.   REVIEW OF SYSTEMS: Out of a complete 14 system review of symptoms, the patient complains only of the following symptoms, headaches and all other reviewed systems are negative.   ALLERGIES: Allergies  Allergen Reactions   Keppra [Levetiracetam]     Hallucination   Topamax [Topiramate]     irritable     HOME MEDICATIONS: Outpatient Medications Prior to Visit  Medication Sig Dispense Refill   acetaminophen (TYLENOL) 500 MG tablet Take 500 mg by mouth every 6 (six) hours as needed (for headaches).     albuterol (VENTOLIN HFA) 108 (90 Base) MCG/ACT inhaler Inhale 1-2 puffs into the lungs every 6 (six) hours as needed for wheezing or shortness of breath.     aspirin EC 81 MG EC tablet Take 1 tablet (81 mg total) by mouth daily. Swallow whole. 30 tablet 11   BD PEN NEEDLE NANO U/F 32G X 4 MM MISC USE AS DIRECTED FOUR TIMES DAILY. 100 each 2   clobetasol (OLUX) 0.05 % topical foam Apply topically 2 (two) times daily. 50 g 3   Continuous Blood Gluc Receiver (FREESTYLE LIBRE 2 READER) DEVI As directed 1 each 0   Continuous Blood Gluc Sensor (FREESTYLE LIBRE 14 DAY SENSOR) MISC CHANGE SENSOR EVERY 14 DAYS 2 each 2   gabapentin (NEURONTIN) 100 MG capsule  Take 100 mg by mouth daily.      HUMALOG MIX 75/25 KWIKPEN (75-25) 100 UNIT/ML KwikPen Inject 30 Units into the skin in the morning and at bedtime.     nitroGLYCERIN (NITROSTAT) 0.4 MG SL tablet DISSOLVE 1 TABLET UNDER TONGUE EVERY 5 MINUTES UP TO 15 MIN FOR CHESTPAIN. IF NO RELIEF CALL 911. (Patient taking differently: Place 0.4 mg under the tongue every 5 (five) minutes as needed for chest pain.) 25 tablet 3   nortriptyline (PAMELOR) 10 MG capsule Take 3 capsules (30 mg total) by mouth at bedtime. 90 capsule 2   pantoprazole (PROTONIX) 40 MG tablet Take 40 mg by mouth daily before breakfast.     rosuvastatin (CRESTOR) 10 MG tablet Take 1 tablet (10 mg total) by mouth daily. 90 tablet 3   traMADol (ULTRAM) 50 MG tablet Take 1 tablet (50 mg total) by  mouth every 6 (six) hours as needed for moderate pain. (Patient taking differently: Take 50 mg by mouth daily.) 15 tablet 0   rizatriptan (MAXALT) 10 MG tablet Take 1 tablet (10 mg total) by mouth 3 (three) times daily as needed for migraine. 10 tablet 3   divalproex (DEPAKOTE) 250 MG DR tablet Take 2 tablets (500 mg total) by mouth daily for 5 days, THEN 1 tablet (250 mg total) daily for 5 days. 15 tablet 0   insulin aspart protamine - aspart (NOVOLOG MIX 70/30 FLEXPEN) (70-30) 100 UNIT/ML FlexPen Inject 30 Units into the skin 2 (two) times daily with a meal. 30 mL 2   No facility-administered medications prior to visit.     PAST MEDICAL HISTORY: Past Medical History:  Diagnosis Date   Anal lesion    Anal papilla/tags - external lesion   Bilateral carpal tunnel syndrome 11/24/2018   Colon polyp    Hyperplastic rectosigmoid polyp 9/08   Coronary atherosclerosis of native coronary artery    DES to PLB, BMS to mid and distal RCA, DES to circumflex and distal LAD, 6/10 (5 stents)   Diabetic peripheral neuropathy ([place]) 11/24/2018   GERD (gastroesophageal reflux disease)    Helicobacter pylori (H. pylori) 09/2009   Treated with helidac   Hiatal  hernia    Hyperplastic colon polyp 2008   Mixed hyperlipidemia    Myocardial infarction ([place]) 06/2008   Seizures ([place])    Stroke [facility])    January 2018   Type 2 diabetes mellitus ([place])      PAST SURGICAL HISTORY: Past Surgical History:  Procedure Laterality Date   CHOLECYSTECTOMY     COLLAPSED LUNG     15 years ago   COLONOSCOPY  10/12/2006   Dr. Gala Romney- rectosigmoid polyp s/phot snare polypectomy o/w normal rectum and terminal ileum. hyperplastic polyp on bx   COLONOSCOPY WITH PROPOFOL N/A 09/01/2019   Procedure: COLONOSCOPY WITH PROPOFOL;  Surgeon: Daneil Dolin, MD;  [facility];  Service: Endoscopy;  Laterality: N/A;  8:30am   EP IMPLANTABLE DEVICE N/A 02/12/2016   Procedure: Loop Recorder Insertion;  Surgeon: Thompson Grayer, MD;  Location: [place] CV LAB;  Service: Cardiovascular;  Laterality: N/A;   ESOPHAGOGASTRODUODENOSCOPY  10/12/2006   Dr. Gala Romney- examination of the tubular esophagus revealed no mucosal abnormalities. the EG junction was easily traversed. small hiatal hernia, the gastric mucosa o/w appeared normal. there was no infiltrating process or frank ulcer seen.   implantable loop recorder removal  09/29/2019   MDT Reveal LINQ removed in office by Dr Rayann Heman   TEE WITHOUT CARDIOVERSION N/A 02/12/2016   Procedure: TRANSESOPHAGEAL ECHOCARDIOGRAM (TEE);  Surgeon: Jerline Pain, MD;  Location: [facility] ENDOSCOPY;  Service: Cardiovascular;  Laterality: N/A;   WIRE IN APEX OF RIGHT LUNG     Since childhood     FAMILY HISTORY: Family History  Problem Relation Age of Onset   Diabetes Mother    Heart disease Father    Cancer Brother    CVA Neg Hx      SOCIAL HISTORY: Social History   Socioeconomic History   Marital status: Married    Spouse name: Not on file   Number of children: Not on file   Years of education: Not on file   Highest education level: Not on file  Occupational History   Occupation: maintenance, parking lots    Employer: SELF-EMPLOYED   Tobacco Use   Smoking status: Every Day    Packs/day: 0.50  Years: 34.00    Pack years: 17.00    Types: Cigarettes    Start date: 04/08/1973   Smokeless tobacco: Never  Vaping Use   Vaping Use: Never used  Substance and Sexual Activity   Alcohol use: No    Alcohol/week: 0.0 standard drinks   Drug use: No   Sexual activity: Yes    Partners: Female    Birth control/protection: Pill  Other Topics Concern   Not on file  Social History Narrative   Drinks Coffee 2 cups daily.  Lives sat home with wife.   Is self employed.  Graduated from High school.     Social Determinants of Health   Financial Resource Strain: Not on file  Food Insecurity: Not on file  Transportation Needs: Not on file  Physical Activity: Not on file  Stress: Not on file  Social Connections: Not on file  Intimate Partner Violence: Not on file     PHYSICAL EXAM  Vitals:   03/18/21 0847  BP: 126/80  Pulse: (!) 112  Weight: 168 lb (76.2 kg)  Height: 5\' 9"  (1.753 m)    Body mass index is 24.81 kg/m.  Generalized: Well developed, in no acute distress  Cardiology: normal rate and rhythm, no murmur auscultated  Respiratory: clear to auscultation bilaterally    Neurological examination  Mentation: Alert oriented to time, place, history taking. Follows all commands speech and language fluent Cranial nerve II-XII: Pupils were equal round reactive to light. Extraocular movements were full, visual field were full on confrontational test. Facial sensation and strength were normal. Head turning and shoulder shrug  were normal and symmetric. Motor: The motor testing reveals 5 over 5 strength of all 4 extremities. Good symmetric motor tone is noted throughout.  Sensory: Sensory testing is intact to soft touch on all 4 extremities. No evidence of extinction is noted.  Coordination: Cerebellar testing reveals good finger-nose-finger and heel-to-shin bilaterally.  Gait and station: Gait is normal.  Reflexes:  Deep tendon reflexes are symmetric and normal bilaterally.    DIAGNOSTIC DATA (LABS, IMAGING, TESTING) - I reviewed patient records, labs, notes, testing and imaging myself where available.  Lab Results  Component Value Date   WBC 12.2 (H) 07/24/2020   HGB 15.5 07/24/2020   HCT 47.5 07/24/2020   MCV 83.9 07/24/2020   PLT 175 07/24/2020      Component Value Date/Time   NA 141 08/30/2020 0811   K 4.5 08/30/2020 0811   CL 103 08/30/2020 0811   CO2 22 08/30/2020 0811   GLUCOSE 101 (H) 08/30/2020 0811   GLUCOSE 207 (H) 07/24/2020 2124   BUN 17 08/30/2020 0811   CREATININE 0.84 08/30/2020 0811   CREATININE 0.78 05/02/2019 0918   CALCIUM 9.8 08/30/2020 0811   PROT 6.9 08/30/2020 0811   PROT 7.2 08/30/2020 0741   ALBUMIN 4.4 08/30/2020 0811   AST 17 08/30/2020 0811   ALT 14 08/30/2020 0811   ALKPHOS 86 08/30/2020 0811   BILITOT 0.3 08/30/2020 0811   BILITOT 0.4 08/30/2020 0741   GFRNONAA >60 07/24/2020 2124   GFRNONAA 98 09/02/2018 0725   GFRAA 115 10/20/2018 0954   GFRAA 114 09/02/2018 0725   Lab Results  Component Value Date   CHOL 156 12/31/2020   HDL 33 (L) 12/31/2020   LDLCALC 87 12/31/2020   TRIG 215 (H) 12/31/2020   CHOLHDL 4.7 12/31/2020   Lab Results  Component Value Date   HGBA1C 9.7 (A) 01/03/2021   Lab Results  Component Value Date  MYTRZNBV67 404 02/11/2016   Lab Results  Component Value Date   TSH 3.450 08/30/2020    No flowsheet data found.   No flowsheet data found.   ASSESSMENT AND PLAN  61 y.o. year old male  has a past medical history of Anal lesion, Bilateral carpal tunnel syndrome (11/24/2018), Colon polyp, Coronary atherosclerosis of native coronary artery, Diabetic peripheral neuropathy ([place]) (11/24/2018), GERD (gastroesophageal reflux disease), Helicobacter pylori (H. pylori) (09/2009), Hiatal hernia, Hyperplastic colon polyp (2008), Mixed hyperlipidemia, Myocardial infarction ([place]) (06/2008), Seizures ([place]), Stroke ([place]), and Type  2 diabetes mellitus ([place]). here with    Complicated migraine - Plan: Ubrogepant (UBRELVY) 100 MG TABS  Joe is doing well, today. No seizure activity and headaches are significantly improved. No complex symptoms since April 2022. We will continue nortriptyline 30mg  daily at bedtime. He weaned Vimpat successfully with no seizure concerns. I would like to switch him from rizatriptan to [place]. Will avoid triptans due to stroke history. Side effects and appropriate administration reviewed. I have encouraged him to increase water intake. Healthy lifestyle habits encouraged. Smoking cessation discussed. He will continue stroke prevention and co morbidity follow up with PCP. He will return to see me in 6 months. He verbalizes understanding and agreement with this plan.    No orders of the defined types were placed in this encounter.    Meds ordered this encounter  Medications   Ubrogepant (UBRELVY) 100 MG TABS    Sig: Take 100 mg by mouth daily as needed. Take one tablet at onset of headache, may repeat 1 tablet in 2 hours, no more than 2 tablets in 24 hours    Dispense:  10 tablet    Refill:  11    Order Specific Question:   Supervising Provider    Answer:   Melvenia Beam [[id number]]       Debbora Presto, MSN, FNP-C 03/18/2021, 10:27 AM  [facility] [address] [place], [place] [address] [phone]

## 2021-03-18 ENCOUNTER — Ambulatory Visit: Payer: BC Managed Care – PPO | Admitting: Family Medicine

## 2021-03-18 ENCOUNTER — Encounter: Payer: Self-pay | Admitting: Family Medicine

## 2021-03-18 VITALS — BP 126/80 | HR 112 | Ht 69.0 in | Wt 168.0 lb

## 2021-03-18 DIAGNOSIS — G43109 Migraine with aura, not intractable, without status migrainosus: Secondary | ICD-10-CM | POA: Diagnosis not present

## 2021-03-18 MED ORDER — UBRELVY 100 MG PO TABS: 100.0000 mg | ORAL_TABLET | Freq: Every day | ORAL | 11 refills | Status: DC | PRN

## 2021-03-20 ENCOUNTER — Encounter: Payer: Self-pay | Admitting: *Deleted

## 2021-03-24 ENCOUNTER — Other Ambulatory Visit: Payer: Self-pay | Admitting: Nurse Practitioner

## 2021-03-27 ENCOUNTER — Other Ambulatory Visit: Payer: Self-pay

## 2021-03-27 MED ORDER — NORTRIPTYLINE HCL 10 MG PO CAPS: 30.0000 mg | ORAL_CAPSULE | Freq: Every day | ORAL | 1 refills | Status: DC

## 2021-04-15 ENCOUNTER — Other Ambulatory Visit: Payer: Self-pay

## 2021-04-15 ENCOUNTER — Encounter: Payer: Self-pay | Admitting: "Endocrinology

## 2021-04-15 ENCOUNTER — Ambulatory Visit (INDEPENDENT_AMBULATORY_CARE_PROVIDER_SITE_OTHER): Payer: BC Managed Care – PPO | Admitting: "Endocrinology

## 2021-04-15 VITALS — BP 112/66 | HR 100 | Ht 69.0 in | Wt 170.6 lb

## 2021-04-15 DIAGNOSIS — E782 Mixed hyperlipidemia: Secondary | ICD-10-CM | POA: Diagnosis not present

## 2021-04-15 DIAGNOSIS — E1159 Type 2 diabetes mellitus with other circulatory complications: Secondary | ICD-10-CM

## 2021-04-15 DIAGNOSIS — I10 Essential (primary) hypertension: Secondary | ICD-10-CM

## 2021-04-15 DIAGNOSIS — Z91199 Patient's noncompliance with other medical treatment and regimen due to unspecified reason: Secondary | ICD-10-CM

## 2021-04-15 DIAGNOSIS — E559 Vitamin D deficiency, unspecified: Secondary | ICD-10-CM | POA: Diagnosis not present

## 2021-04-15 LAB — POCT GLYCOSYLATED HEMOGLOBIN (HGB A1C): HbA1c, POC (controlled diabetic range): 10.1 % — AB (ref 0.0–7.0)

## 2021-04-15 NOTE — Progress Notes (Signed)
04/15/2021 ? ?Endocrinology follow-up note ? ?Subjective:  ? ? Patient ID: Nicholas [name], male    DOB: 1960/11/11,  ? ? ?Past Medical History:  ?Diagnosis Date  ? Anal lesion   ? Anal papilla/tags - external lesion  ? Bilateral carpal tunnel syndrome 11/24/2018  ? Colon polyp   ? Hyperplastic rectosigmoid polyp 9/08  ? Coronary atherosclerosis of native coronary artery   ? DES to PLB, BMS to mid and distal RCA, DES to circumflex and distal LAD, 6/10 (5 stents)  ? Diabetic peripheral neuropathy ([place]) 11/24/2018  ? GERD (gastroesophageal reflux disease)   ? Helicobacter pylori (H. pylori) 09/2009  ? Treated with helidac  ? Hiatal hernia   ? Hyperplastic colon polyp 2008  ? Mixed hyperlipidemia   ? Myocardial infarction [facility]) 06/2008  ? Seizures ([place])   ? Stroke [facility])   ? January 2018  ? Type 2 diabetes mellitus ([place])   ? ?Past Surgical History:  ?Procedure Laterality Date  ? CHOLECYSTECTOMY    ? COLLAPSED LUNG    ? 15 years ago  ? COLONOSCOPY  10/12/2006  ? Dr. Gala Romney- rectosigmoid polyp s/phot snare polypectomy o/w normal rectum and terminal ileum. hyperplastic polyp on bx  ? COLONOSCOPY WITH PROPOFOL N/A 09/01/2019  ? Procedure: COLONOSCOPY WITH PROPOFOL;  Surgeon: Daneil Dolin, MD;  [facility];  Service: Endoscopy;  Laterality: N/A;  8:30am  ? EP IMPLANTABLE DEVICE N/A 02/12/2016  ? Procedure: Loop Recorder Insertion;  Surgeon: Thompson Grayer, MD;  Location: [place] CV LAB;  Service: Cardiovascular;  Laterality: N/A;  ? ESOPHAGOGASTRODUODENOSCOPY  10/12/2006  ? Dr. Gala Romney- examination of the tubular esophagus revealed no mucosal abnormalities. the EG junction was easily traversed. small hiatal hernia, the gastric mucosa o/w appeared normal. there was no infiltrating process or frank ulcer seen.  ? implantable loop recorder removal  09/29/2019  ? MDT Reveal LINQ removed in office by Dr Rayann Heman  ? TEE WITHOUT CARDIOVERSION N/A 02/12/2016  ? Procedure: TRANSESOPHAGEAL ECHOCARDIOGRAM (TEE);  Surgeon: Jerline Pain, MD;  Location: [facility] ENDOSCOPY;  Service: Cardiovascular;  Laterality: N/A;  ? WIRE IN APEX OF RIGHT LUNG    ? Since childhood  ? ?Social History  ? ?Socioeconomic History  ? Marital status: Married  ?  Spouse name: Not on file  ? Number of children: Not on file  ? Years of education: Not on file  ? Highest education level: Not on file  ?Occupational History  ? Occupation: maintenance, parking lots  ?  Employer: SELF-EMPLOYED  ?Tobacco Use  ? Smoking status: Every Day  ?  Packs/day: 0.50  ?  Years: 34.00  ?  Pack years: 17.00  ?  Types: Cigarettes  ?  Start date: 04/08/1973  ? Smokeless tobacco: Never  ?Vaping Use  ? Vaping Use: Never used  ?Substance and Sexual Activity  ? Alcohol use: No  ?  Alcohol/week: 0.0 standard drinks  ? Drug use: No  ? Sexual activity: Yes  ?  Partners: Female  ?  Birth control/protection: Pill  ?Other Topics Concern  ? Not on file  ?Social History Narrative  ? Drinks Coffee 2 cups daily.  Lives sat home with wife.   Is self employed.  Graduated from High school.    ? ?Social Determinants of Health  ? ?Financial Resource Strain: Not on file  ?Food Insecurity: Not on file  ?Transportation Needs: Not on file  ?Physical Activity: Not on file  ?Stress: Not on file  ?Social Connections:  Not on file  ? ?Outpatient Encounter Medications as of 04/15/2021  ?Medication Sig  ? acetaminophen (TYLENOL) 500 MG tablet Take 500 mg by mouth every 6 (six) hours as needed (for headaches).  ? albuterol (VENTOLIN HFA) 108 (90 Base) MCG/ACT inhaler Inhale 1-2 puffs into the lungs every 6 (six) hours as needed for wheezing or shortness of breath.  ? aspirin EC 81 MG EC tablet Take 1 tablet (81 mg total) by mouth daily. Swallow whole.  ? BD PEN NEEDLE NANO U/F 32G X 4 MM MISC USE AS DIRECTED FOUR TIMES DAILY.  ? clobetasol (OLUX) 0.05 % topical foam Apply topically 2 (two) times daily.  ? Continuous Blood Gluc Receiver (FREESTYLE LIBRE 2 READER) DEVI As directed  ? Continuous Blood Gluc Sensor (FREESTYLE LIBRE  14 DAY SENSOR) MISC USE 1 SENSOR EVERY 14 DAYS  ? gabapentin (NEURONTIN) 100 MG capsule Take 100 mg by mouth daily.   ? HUMALOG MIX 75/25 KWIKPEN (75-25) 100 UNIT/ML KwikPen Inject 30 Units into the skin in the morning and at bedtime.  ? nitroGLYCERIN (NITROSTAT) 0.4 MG SL tablet DISSOLVE 1 TABLET UNDER TONGUE EVERY 5 MINUTES UP TO 15 MIN FOR CHESTPAIN. IF NO RELIEF CALL 911. (Patient taking differently: Place 0.4 mg under the tongue every 5 (five) minutes as needed for chest pain.)  ? nortriptyline (PAMELOR) 10 MG capsule Take 3 capsules (30 mg total) by mouth at bedtime.  ? pantoprazole (PROTONIX) 40 MG tablet Take 40 mg by mouth daily before breakfast.  ? rosuvastatin (CRESTOR) 10 MG tablet Take 1 tablet (10 mg total) by mouth daily.  ? traMADol (ULTRAM) 50 MG tablet Take 1 tablet (50 mg total) by mouth every 6 (six) hours as needed for moderate pain. (Patient taking differently: Take 50 mg by mouth daily.)  ? Ubrogepant (UBRELVY) 100 MG TABS Take 100 mg by mouth daily as needed. Take one tablet at onset of headache, may repeat 1 tablet in 2 hours, no more than 2 tablets in 24 hours  ? ?No facility-administered encounter medications on file as of 04/15/2021.  ? ?ALLERGIES: ?Allergies  ?Allergen Reactions  ? Keppra [Levetiracetam]   ?  Hallucination  ? Topamax [Topiramate]   ?  irritable  ? ?VACCINATION STATUS: ? ?There is no immunization history on file for this patient. ? ?Diabetes ?He presents for his follow-up diabetic visit. He has type 2 diabetes mellitus. Onset time: He was diagnosed at approximate age of 49 years. In this particular patient with a history of heavy alcohol use possiblity of pancreatic diabetes is high. His disease course has been worsening. Pertinent negatives for hypoglycemia include no confusion, headaches, pallor, seizures or speech difficulty. Pertinent negatives for diabetes include no chest pain, no fatigue, no polydipsia, no polyphagia, no polyuria and no weakness. There are no  hypoglycemic complications. Symptoms are worsening. Diabetic complications include a CVA and heart disease. Risk factors for coronary artery disease include diabetes mellitus, dyslipidemia, hypertension, male sex, sedentary lifestyle and tobacco exposure. Current diabetic treatment includes oral agent (dual therapy). He is compliant with treatment some of the time. His weight is fluctuating minimally. He is following a generally unhealthy diet. When asked about meal planning, he reported none. He has not had a previous visit with a dietitian (he did not keep appointment.). He rarely participates in exercise. His home blood glucose trend is increasing steadily. His breakfast blood glucose range is generally >200 mg/dl. His lunch blood glucose range is generally >200 mg/dl. His dinner blood glucose range is  generally >200 mg/dl. His bedtime blood glucose range is generally >200 mg/dl. His overall blood glucose range is >200 mg/dl. (Mr. Dutton presents with worsening glycemic profile.  He is CGM was reviewed.  AGP shows 13% time range, 86% above range.  Has no major hypoglycemia.  His point-of-care A1c is 10.1%, progressively worsening.  He has not been utilizing his insulin as recommended.   ?His average blood glucose for the last 14 days is 259 mg per DL. ?) Eye exam is current.  ?Hyperlipidemia ?This is a chronic problem. The current episode started more than 1 year ago. The problem is uncontrolled (He was convinced to take Crestor during his last visit, returns with significant improvement in his LDL from 135 down to 87, triglycerides from 396-215.). Exacerbating diseases include diabetes. Pertinent negatives include no chest pain, myalgias or shortness of breath. Current antihyperlipidemic treatment includes statins. Risk factors for coronary artery disease include dyslipidemia, diabetes mellitus, hypertension and a sedentary lifestyle.  ?Hypertension ?This is a chronic problem. The current episode started more  than 1 year ago. The problem is controlled. Pertinent negatives include no chest pain, headaches, neck pain, palpitations or shortness of breath. Risk factors for coronary artery disease include diabetes mellitu

## 2021-04-15 NOTE — Patient Instructions (Signed)

## 2021-05-02 ENCOUNTER — Telehealth: Payer: Self-pay | Admitting: Family Medicine

## 2021-05-02 NOTE — Telephone Encounter (Signed)
Called pt back. Relayed Dr. Guadelupe Sabin recommendation. Pt is going to continue to monitor headache for now. He did not want to go to urgent care at the moment. I did encourage him to go if sx persisted or worsened. He verbalized understanding.  ?

## 2021-05-02 NOTE — Telephone Encounter (Signed)
At this point, it is probably best for him to go to urgent care, they can give him IV medication or IM medication. ?

## 2021-05-02 NOTE — Telephone Encounter (Signed)
Pt states he got a headache yesterday, he took the prescribed medication and it hasn't helped, pt is asking for a call to discuss ?

## 2021-05-02 NOTE — Telephone Encounter (Signed)
Called and spoke w/ pt. HA started yesterday afternoon around 3pm. It has been constant since. Pain located behind R eye. Took Ubrelvy morning around 7am and then another dose at 2pm. Ineffective. Has not taken anything else. ?Taking nortriptyline 30mg  po qhs as prescribed.  ? ?Has taken prednisone taper pack in the past in 2021 from Dr. Jannifer Franklin. He has hx diabetes. Feels this was helpful. ? ?Reports Depakote taper that Dr. Billey Gosling called in back on 02/18/21 was helpful as well (she was work in when he called then). Dr. Billey Gosling called in Depakote 250mg  Take 2 tablets (500 mg total) by mouth daily for 5 days, THEN 1 tablet (250 mg total) daily for 5 days. #15, 0 refills. ? ?Triptans contraindicated, has stroke hx ? ? ?

## 2021-05-11 ENCOUNTER — Encounter: Payer: Self-pay | Admitting: Family Medicine

## 2021-05-14 NOTE — Telephone Encounter (Signed)
Called [facility] provider service line at [phone]. Spoke w/ Nilda Simmer. Pt ID: [id number]. Advised we needed to add "01" at the end of ID. I deleted letters in the front and added 01 and was able to start PA.  Key: [id number] - PA Case ID: [id number]. Waiting on determination from [facility]. ? ?Triptans contraindicated, has stroke hx ?

## 2021-05-15 MED ORDER — NORTRIPTYLINE HCL 10 MG PO CAPS: 40.0000 mg | ORAL_CAPSULE | Freq: Every day | ORAL | 1 refills | Status: DC

## 2021-05-15 NOTE — Addendum Note (Signed)
Addended by: Wyvonnia Lora on: 05/15/2021 07:10 AM ? ? Modules accepted: Orders ? ?

## 2021-06-26 ENCOUNTER — Other Ambulatory Visit: Payer: Self-pay | Admitting: "Endocrinology

## 2021-07-10 DIAGNOSIS — E782 Mixed hyperlipidemia: Secondary | ICD-10-CM | POA: Diagnosis not present

## 2021-07-10 DIAGNOSIS — E1159 Type 2 diabetes mellitus with other circulatory complications: Secondary | ICD-10-CM | POA: Diagnosis not present

## 2021-07-11 LAB — COMPREHENSIVE METABOLIC PANEL
ALT: 29 IU/L (ref 0–44)
AST: 17 IU/L (ref 0–40)
Albumin/Globulin Ratio: 1.6 (ref 1.2–2.2)
Albumin: 4.3 g/dL (ref 3.8–4.8)
Alkaline Phosphatase: 92 IU/L (ref 44–121)
BUN/Creatinine Ratio: 19 (ref 10–24)
BUN: 19 mg/dL (ref 8–27)
Bilirubin Total: 0.2 mg/dL (ref 0.0–1.2)
CO2: 22 mmol/L (ref 20–29)
Calcium: 9.3 mg/dL (ref 8.6–10.2)
Chloride: 101 mmol/L (ref 96–106)
Creatinine, Ser: 0.99 mg/dL (ref 0.76–1.27)
Globulin, Total: 2.7 g/dL (ref 1.5–4.5)
Glucose: 308 mg/dL — ABNORMAL HIGH (ref 70–99)
Potassium: 5.1 mmol/L (ref 3.5–5.2)
Sodium: 138 mmol/L (ref 134–144)
Total Protein: 7 g/dL (ref 6.0–8.5)
eGFR: 86 mL/min/{1.73_m2} (ref >59)

## 2021-07-11 LAB — LIPID PANEL
Chol/HDL Ratio: 5.9 ratio — ABNORMAL HIGH (ref 0.0–5.0)
Cholesterol, Total: 183 mg/dL (ref 100–199)
HDL: 31 mg/dL — ABNORMAL LOW (ref >39)
LDL Chol Calc (NIH): 103 mg/dL — ABNORMAL HIGH (ref 0–99)
Triglycerides: 284 mg/dL — ABNORMAL HIGH (ref 0–149)
VLDL Cholesterol Cal: 49 mg/dL — ABNORMAL HIGH (ref 5–40)

## 2021-07-11 LAB — TSH: TSH: 2.57 u[IU]/mL (ref 0.450–4.500)

## 2021-07-11 LAB — T4, FREE: Free T4: 1.07 ng/dL (ref 0.82–1.77)

## 2021-07-16 ENCOUNTER — Ambulatory Visit: Payer: BC Managed Care – PPO | Admitting: "Endocrinology

## 2021-07-16 ENCOUNTER — Telehealth: Payer: Self-pay | Admitting: *Deleted

## 2021-07-16 ENCOUNTER — Encounter: Payer: Self-pay | Admitting: "Endocrinology

## 2021-07-16 VITALS — BP 130/68 | HR 96 | Ht 69.0 in | Wt 172.0 lb

## 2021-07-16 DIAGNOSIS — E559 Vitamin D deficiency, unspecified: Secondary | ICD-10-CM | POA: Diagnosis not present

## 2021-07-16 DIAGNOSIS — E1159 Type 2 diabetes mellitus with other circulatory complications: Secondary | ICD-10-CM | POA: Diagnosis not present

## 2021-07-16 DIAGNOSIS — I10 Essential (primary) hypertension: Secondary | ICD-10-CM | POA: Diagnosis not present

## 2021-07-16 DIAGNOSIS — E782 Mixed hyperlipidemia: Secondary | ICD-10-CM

## 2021-07-16 LAB — POCT GLYCOSYLATED HEMOGLOBIN (HGB A1C): HbA1c, POC (controlled diabetic range): 10.7 % — AB (ref 0.0–7.0)

## 2021-07-16 MED ORDER — FREESTYLE LIBRE 14 DAY SENSOR MISC
2 refills | Status: DC
Start: 2021-07-16 — End: 2021-08-22

## 2021-07-16 MED ORDER — ROSUVASTATIN CALCIUM 20 MG PO TABS: 20.0000 mg | ORAL_TABLET | Freq: Every day | ORAL | 1 refills | Status: DC

## 2021-07-16 NOTE — Patient Instructions (Signed)

## 2021-07-16 NOTE — Telephone Encounter (Signed)
Request Reference Number: [id number]. UBRELVY TAB 100MG  is approved through 07/17/2022. Your patient may now fill this prescription and it will be covered.

## 2021-07-16 NOTE — Telephone Encounter (Signed)
Submitted PA Ubrelvy on [facility]. Key: [id number]. Waiting on determination from [facility].

## 2021-07-16 NOTE — Progress Notes (Signed)
07/16/2021  Endocrinology follow-up note  Subjective:    Patient ID: Nicholas [name], male    DOB: 1960-11-16,    Past Medical History:  Diagnosis Date   Anal lesion    Anal papilla/tags - external lesion   Bilateral carpal tunnel syndrome 11/24/2018   Colon polyp    Hyperplastic rectosigmoid polyp 9/08   Coronary atherosclerosis of native coronary artery    DES to PLB, BMS to mid and distal RCA, DES to circumflex and distal LAD, 6/10 (5 stents)   Diabetic peripheral neuropathy ([place]) 11/24/2018   GERD (gastroesophageal reflux disease)    Helicobacter pylori (H. pylori) 09/2009   Treated with helidac   Hiatal hernia    Hyperplastic colon polyp 2008   Mixed hyperlipidemia    Myocardial infarction ([place]) 06/2008   Seizures ([place])    Stroke [facility])    January 2018   Type 2 diabetes mellitus [facility])    Past Surgical History:  Procedure Laterality Date   CHOLECYSTECTOMY     COLLAPSED LUNG     15 years ago   COLONOSCOPY  10/12/2006   Dr. Gala Romney- rectosigmoid polyp s/phot snare polypectomy o/w normal rectum and terminal ileum. hyperplastic polyp on bx   COLONOSCOPY WITH PROPOFOL N/A 09/01/2019   Procedure: COLONOSCOPY WITH PROPOFOL;  Surgeon: Daneil Dolin, MD;  Location[facility];  Service: Endoscopy;  Laterality: N/A;  8:30am   EP IMPLANTABLE DEVICE N/A 02/12/2016   Procedure: Loop Recorder Insertion;  Surgeon: Thompson Grayer, MD;  Location: [place] CV LAB;  Service: Cardiovascular;  Laterality: N/A;   ESOPHAGOGASTRODUODENOSCOPY  10/12/2006   Dr. Gala Romney- examination of the tubular esophagus revealed no mucosal abnormalities. the EG junction was easily traversed. small hiatal hernia, the gastric mucosa o/w appeared normal. there was no infiltrating process or frank ulcer seen.   implantable loop recorder removal  09/29/2019   MDT Reveal LINQ removed in office by Dr Rayann Heman   TEE WITHOUT CARDIOVERSION N/A 02/12/2016   Procedure: TRANSESOPHAGEAL ECHOCARDIOGRAM (TEE);  Surgeon: Jerline Pain, MD;  Location: [facility] ENDOSCOPY;  Service: Cardiovascular;  Laterality: N/A;   WIRE IN APEX OF RIGHT LUNG     Since childhood   Social History   Socioeconomic History   Marital status: Married    Spouse name: Not on file   Number of children: Not on file   Years of education: Not on file   Highest education level: Not on file  Occupational History   Occupation: maintenance, parking lots    Employer: SELF-EMPLOYED  Tobacco Use   Smoking status: Every Day    Packs/day: 0.50    Years: 34.00    Total pack years: 17.00    Types: Cigarettes    Start date: 04/08/1973   Smokeless tobacco: Never  Vaping Use   Vaping Use: Never used  Substance and Sexual Activity   Alcohol use: No    Alcohol/week: 0.0 standard drinks of alcohol   Drug use: No   Sexual activity: Yes    Partners: Female    Birth control/protection: Pill  Other Topics Concern   Not on file  Social History Narrative   Drinks Coffee 2 cups daily.  Lives sat home with wife.   Is self employed.  Graduated from High school.     Social Determinants of Health   Financial Resource Strain: Not on file  Food Insecurity: Not on file  Transportation Needs: Not on file  Physical Activity: Not on file  Stress: Not on file  Social Connections: Not on file   Outpatient Encounter Medications as of 07/16/2021  Medication Sig   acetaminophen (TYLENOL) 500 MG tablet Take 500 mg by mouth every 6 (six) hours as needed (for headaches).   albuterol (VENTOLIN HFA) 108 (90 Base) MCG/ACT inhaler Inhale 1-2 puffs into the lungs every 6 (six) hours as needed for wheezing or shortness of breath.   aspirin EC 81 MG EC tablet Take 1 tablet (81 mg total) by mouth daily. Swallow whole.   BD PEN NEEDLE NANO U/F 32G X 4 MM MISC USE AS DIRECTED FOUR TIMES DAILY.   Continuous Blood Gluc Receiver (FREESTYLE LIBRE 2 READER) DEVI As directed   Continuous Blood Gluc Sensor (FREESTYLE LIBRE 14 DAY SENSOR) MISC APPLY ONE SENSOR EXTERNALLY EVERY 14  DAYS   gabapentin (NEURONTIN) 100 MG capsule Take 100 mg by mouth daily.    HUMALOG MIX 75/25 KWIKPEN (75-25) 100 UNIT/ML KwikPen Inject 40 Units into the skin in the morning and at bedtime.   nitroGLYCERIN (NITROSTAT) 0.4 MG SL tablet DISSOLVE 1 TABLET UNDER TONGUE EVERY 5 MINUTES UP TO 15 MIN FOR CHESTPAIN. IF NO RELIEF CALL 911. (Patient taking differently: Place 0.4 mg under the tongue every 5 (five) minutes as needed for chest pain.)   nortriptyline (PAMELOR) 10 MG capsule Take 4 capsules (40 mg total) by mouth at bedtime.   pantoprazole (PROTONIX) 40 MG tablet Take 40 mg by mouth daily before breakfast.   rosuvastatin (CRESTOR) 20 MG tablet Take 1 tablet (20 mg total) by mouth daily.   traMADol (ULTRAM) 50 MG tablet Take 1 tablet (50 mg total) by mouth every 6 (six) hours as needed for moderate pain. (Patient taking differently: Take 50 mg by mouth daily.)   Ubrogepant (UBRELVY) 100 MG TABS Take 100 mg by mouth daily as needed. Take one tablet at onset of headache, may repeat 1 tablet in 2 hours, no more than 2 tablets in 24 hours   [DISCONTINUED] Continuous Blood Gluc Sensor (FREESTYLE LIBRE 14 DAY SENSOR) MISC APPLY ONE SENSOR EXTERNALLY EVERY 14 DAYS   [DISCONTINUED] rosuvastatin (CRESTOR) 10 MG tablet Take 1 tablet (10 mg total) by mouth daily.   No facility-administered encounter medications on file as of 07/16/2021.   ALLERGIES: Allergies  Allergen Reactions   Keppra [Levetiracetam]     Hallucination   Topamax [Topiramate]     irritable   VACCINATION STATUS:  There is no immunization history on file for this patient.  Diabetes He presents for his follow-up diabetic visit. He has type 2 diabetes mellitus. Onset time: He was diagnosed at approximate age of 15 years. In this particular patient with a history of heavy alcohol use possiblity of pancreatic diabetes is high. Pertinent negatives for hypoglycemia include no confusion, headaches, pallor, seizures or speech difficulty.  Pertinent negatives for diabetes include no chest pain, no fatigue, no polydipsia, no polyphagia, no polyuria and no weakness. There are no hypoglycemic complications. Symptoms are worsening. Diabetic complications include a CVA and heart disease. Risk factors for coronary artery disease include diabetes mellitus, dyslipidemia, hypertension, male sex, sedentary lifestyle and tobacco exposure. Current diabetic treatment includes oral agent (dual therapy). He is compliant with treatment some of the time. His weight is fluctuating minimally. He is following a generally unhealthy diet. When asked about meal planning, he reported none. He has not had a previous visit with a dietitian (he did not keep appointment.). He rarely participates in exercise. His home blood glucose trend is increasing steadily. His breakfast blood glucose  range is generally >200 mg/dl. His lunch blood glucose range is generally >200 mg/dl. His dinner blood glucose range is generally >200 mg/dl. His bedtime blood glucose range is generally >200 mg/dl. His overall blood glucose range is >200 mg/dl. (Nicholas [name] presents with worsening glycemic profile.  His CGM was reviewed.  AGP shows glycemic profile with 2% time in range, 98% above range.  His point-of-care A1c is also increasing to 10.7% he did not have any hypoglycemia.  Average blood glucose is 306 mg per DL for the last 14 days.    ) Eye exam is current.  Hyperlipidemia This is a chronic problem. The current episode started more than 1 year ago. The problem is uncontrolled (He was convinced to take Crestor during his last visit, returns with significant improvement in his LDL from 135 down to 87, triglycerides from 396-215.). Exacerbating diseases include diabetes. Pertinent negatives include no chest pain, myalgias or shortness of breath. Current antihyperlipidemic treatment includes statins. Risk factors for coronary artery disease include dyslipidemia, diabetes mellitus, hypertension  and a sedentary lifestyle.  Hypertension This is a chronic problem. The current episode started more than 1 year ago. The problem is controlled. Pertinent negatives include no chest pain, headaches, neck pain, palpitations or shortness of breath. Risk factors for coronary artery disease include diabetes mellitus, dyslipidemia and smoking/tobacco exposure. Past treatments include beta blockers. Hypertensive end-organ damage includes CAD/MI and CVA.    Review of Systems  Constitutional:  Negative for fatigue and unexpected weight change.  HENT:  Negative for dental problem, mouth sores and trouble swallowing.   Eyes:  Negative for visual disturbance.  Respiratory:  Negative for cough, choking, chest tightness, shortness of breath and wheezing.   Cardiovascular:  Negative for chest pain, palpitations and leg swelling.  Gastrointestinal:  Negative for abdominal distention, abdominal pain, constipation, diarrhea, nausea and vomiting.  Endocrine: Negative for polydipsia, polyphagia and polyuria.  Genitourinary:  Negative for dysuria, flank pain, hematuria and urgency.  Musculoskeletal:  Negative for back pain, gait problem, myalgias and neck pain.  Skin:  Negative for pallor, rash and wound.  Neurological:  Negative for seizures, syncope, speech difficulty, weakness, numbness and headaches.  Psychiatric/Behavioral:  Negative for confusion and dysphoric mood.     Objective:    BP 130/68   Pulse 96   Ht 5\' 9"  (1.753 m)   Wt 172 lb (78 kg)   BMI 25.40 kg/m   Wt Readings from Last 3 Encounters:  07/16/21 172 lb (78 kg)  04/15/21 170 lb 9.6 oz (77.4 kg)  03/18/21 168 lb (76.2 kg)      Results for orders placed or performed in visit on 07/16/21  HgB A1c  Result Value Ref Range   Hemoglobin A1C     HbA1c POC (<> result, manual entry)     HbA1c, POC (prediabetic range)     HbA1c, POC (controlled diabetic range) 10.7 (A) 0.0 - 7.0 %   Diabetic Labs (most recent): Lab Results  Component  Value Date   HGBA1C 10.7 (A) 07/16/2021   HGBA1C 10.1 (A) 04/15/2021   HGBA1C 9.7 (A) 01/03/2021   Lipid Panel     Component Value Date/Time   CHOL 183 07/10/2021 0838   TRIG 284 (H) 07/10/2021 0838   HDL 31 (L) 07/10/2021 0838   CHOLHDL 5.9 (H) 07/10/2021 0838   CHOLHDL 7.7 (H) 08/30/2020 0741   VLDL 20 05/11/2020 0224   LDLCALC 103 (H) 07/10/2021 0838   LDLCALC 139 (H) 08/30/2020 9417  Assessment & Plan:   1. Type 2 diabetes mellitus with vascular disease (HCC) -His diabetes is  complicated by coronary artery disease and patient remains at a high risk for more acute and chronic complications of diabetes which include CAD, CVA, CKD, retinopathy, and neuropathy. These are all discussed in detail with the patient.  Nicholas [name] presents with worsening glycemic profile.  His CGM was reviewed.  AGP shows glycemic profile with 2% time in range, 98% above range.  His point-of-care A1c is also increasing to 10.7% he did not have any hypoglycemia.  Average blood glucose is 306 mg per DL for the last 14 days.    - I have re-counseled the patient on diet management  by adopting a carbohydrate restricted / protein rich  Diet.  - Suggestion is made for him to avoid simple carbohydrates  from his diet including Cakes, Sweet Desserts, Ice Cream, Soda (diet and regular), Sweet Tea, Candies, Chips, Cookies, Store Bought Juices, Alcohol in Excess of  1-2 drinks a day, Artificial Sweeteners,  Coffee Creamer, and "Sugar-free" Products, Lemonade. This will help patient to have more stable blood glucose profile and potentially avoid unintended weight gain.  - Patient is advised to stick to a routine mealtimes to eat 3 meals  a day and avoid unnecessary snacks ( to snack only to correct hypoglycemia).  - I have approached patient with the following individualized plan to manage diabetes and patient agrees.  Nicholas [name] remains alarmingly noncompliant/nonadherent.  He failed to utilize his recommended  treatment options.    He did not comply with basal/bolus insulin previously tried.  He was switched to premixed insulin so he can use twice a day.   He is approached again for proper engagement for basic utility of his insulin.  I discussed and increase his Humalog 75/25 to 40 units with breakfast and 40 units with supper when Premeal blood glucose readings are above 90 mg per DL.    -He is advised to continue monitoring blood glucose continuously, at least 4 times a day.  Currently he is scanning only 0-1 times a day even with his CGM.   -He is warned not to inject insulin without proper monitoring of blood glucose.  - He is not the right candidate for Invokana nor incretintherapy.  -Target numbers for A1c, LDL, HDL, Triglycerides,  were discussed in detail.   2) BP/HTN:  His blood pressure is controlled to target.  He is advised to continue his current medications including beta blockers.  He has marginal blood pressure, he will not tolerate additional ACE inhibitor's.     3) Lipids/HPL: His lipid panel shows worsening profile once again, LDL at 103 from 87.  He is advised to increase his Crestor to 20 mg nightly.     Side effects and precautions discussed with him.   4)  Weight/Diet: His BMI is 25.4-  He is not a candidate for weight loss.  He is  following with  CDE consult, exercise, and carbohydrates information provided.    5) Chronic Care/Health Maintenance:  -Patient is  Statin medications and encouraged to continue to follow up with Ophthalmology, Podiatrist at least yearly or according to recommendations, and advised to  quit Smoking (tragically he resumed smoking after he quit briefly during his diagnosis of CVA ). I have recommended yearly flu vaccine and pneumonia vaccination at least every 5 years; moderate intensity exercise for up to 150 minutes weekly; and  sleep for at least 7 hours a day.  The  patient was counseled on the dangers of tobacco use, and was advised to  quit.  Reviewed strategies to maximize success, including removing cigarettes and smoking materials from environment.  He recently had normal ABI, will be repeated in November 2026,  or sooner if needed. I advised patient to maintain close follow up with his PCP for primary care needs.  I spent 41 minutes in the care of the patient today including review of labs from [facility], Lipids, Thyroid Function, Hematology (current and previous including abstractions from other facilities); face-to-face time discussing  his blood glucose readings/logs, discussing hypoglycemia and hyperglycemia episodes and symptoms, medications doses, his options of short and long term treatment based on the latest standards of care / guidelines;  discussion about incorporating lifestyle medicine;  and documenting the encounter.    Please refer to Patient Instructions for Blood Glucose Monitoring and Insulin/Medications Dosing Guide"  in media tab for additional information. Please  also refer to " Patient Self Inventory" in the Media  tab for reviewed elements of pertinent patient history.  Nicholas [name] participated in the discussions, expressed understanding, and voiced agreement with the above plans.  All questions were answered to his satisfaction. he is encouraged to contact clinic should he have any questions or concerns prior to his return visit.   Follow up plan: Return in about 2 weeks (around 07/30/2021) for F/U with Meter/CGM [facility] Only - no Labs.  Nicholas Lloyd, MD Phone: [phone]  Fax: [phone]  This note was partially dictated with voice recognition software. Similar sounding words can be transcribed inadequately or may not  be corrected upon review.  07/16/2021, 1:05 PM

## 2021-07-19 DIAGNOSIS — R5383 Other fatigue: Secondary | ICD-10-CM | POA: Diagnosis not present

## 2021-07-19 DIAGNOSIS — I2510 Atherosclerotic heart disease of native coronary artery without angina pectoris: Secondary | ICD-10-CM | POA: Diagnosis not present

## 2021-07-19 DIAGNOSIS — E1140 Type 2 diabetes mellitus with diabetic neuropathy, unspecified: Secondary | ICD-10-CM | POA: Diagnosis not present

## 2021-07-19 DIAGNOSIS — Z0001 Encounter for general adult medical examination with abnormal findings: Secondary | ICD-10-CM | POA: Diagnosis not present

## 2021-07-19 DIAGNOSIS — Z6825 Body mass index (BMI) 25.0-25.9, adult: Secondary | ICD-10-CM | POA: Diagnosis not present

## 2021-07-19 DIAGNOSIS — Z720 Tobacco use: Secondary | ICD-10-CM | POA: Diagnosis not present

## 2021-07-19 DIAGNOSIS — Z1331 Encounter for screening for depression: Secondary | ICD-10-CM | POA: Diagnosis not present

## 2021-07-19 DIAGNOSIS — G44329 Chronic post-traumatic headache, not intractable: Secondary | ICD-10-CM | POA: Diagnosis not present

## 2021-07-19 DIAGNOSIS — I10 Essential (primary) hypertension: Secondary | ICD-10-CM | POA: Diagnosis not present

## 2021-08-01 ENCOUNTER — Ambulatory Visit: Payer: BC Managed Care – PPO | Admitting: "Endocrinology

## 2021-08-22 ENCOUNTER — Encounter: Payer: Self-pay | Admitting: "Endocrinology

## 2021-08-22 ENCOUNTER — Ambulatory Visit: Payer: BC Managed Care – PPO | Admitting: "Endocrinology

## 2021-08-22 VITALS — BP 110/68 | HR 96 | Ht 69.0 in | Wt 168.6 lb

## 2021-08-22 DIAGNOSIS — I10 Essential (primary) hypertension: Secondary | ICD-10-CM | POA: Diagnosis not present

## 2021-08-22 DIAGNOSIS — E559 Vitamin D deficiency, unspecified: Secondary | ICD-10-CM

## 2021-08-22 DIAGNOSIS — E1159 Type 2 diabetes mellitus with other circulatory complications: Secondary | ICD-10-CM | POA: Diagnosis not present

## 2021-08-22 DIAGNOSIS — E782 Mixed hyperlipidemia: Secondary | ICD-10-CM

## 2021-08-22 MED ORDER — FREESTYLE LIBRE 2 SENSOR MISC: 1.0000 | 3 refills | Status: DC

## 2021-08-22 MED ORDER — FREESTYLE LIBRE 2 READER DEVI: 0 refills | Status: DC

## 2021-08-22 NOTE — Patient Instructions (Signed)

## 2021-08-22 NOTE — Progress Notes (Signed)
08/22/2021  Endocrinology follow-up note  Subjective:    Patient ID: Nicholas [name], male    DOB: 1960/03/27,    Past Medical History:  Diagnosis Date   Anal lesion    Anal papilla/tags - external lesion   Bilateral carpal tunnel syndrome 11/24/2018   Colon polyp    Hyperplastic rectosigmoid polyp 9/08   Coronary atherosclerosis of native coronary artery    DES to PLB, BMS to mid and distal RCA, DES to circumflex and distal LAD, 6/10 (5 stents)   Diabetic peripheral neuropathy ([place]) 11/24/2018   GERD (gastroesophageal reflux disease)    Helicobacter pylori (H. pylori) 09/2009   Treated with helidac   Hiatal hernia    Hyperplastic colon polyp 2008   Mixed hyperlipidemia    Myocardial infarction ([place]) 06/2008   Seizures ([place])    Stroke [facility])    January 2018   Type 2 diabetes mellitus [facility])    Past Surgical History:  Procedure Laterality Date   CHOLECYSTECTOMY     COLLAPSED LUNG     15 years ago   COLONOSCOPY  10/12/2006   Dr. Gala Romney- rectosigmoid polyp s/phot snare polypectomy o/w normal rectum and terminal ileum. hyperplastic polyp on bx   COLONOSCOPY WITH PROPOFOL N/A 09/01/2019   Procedure: COLONOSCOPY WITH PROPOFOL;  Surgeon: Daneil Dolin, MD;  Location: [facility];  Service: Endoscopy;  Laterality: N/A;  8:30am   EP IMPLANTABLE DEVICE N/A 02/12/2016   Procedure: Loop Recorder Insertion;  Surgeon: Thompson Grayer, MD;  Location: [place] [facility] LAB;  Service: Cardiovascular;  Laterality: N/A;   ESOPHAGOGASTRODUODENOSCOPY  10/12/2006   Dr. Gala Romney- examination of the tubular esophagus revealed no mucosal abnormalities. the EG junction was easily traversed. small hiatal hernia, the gastric mucosa o/w appeared normal. there was no infiltrating process or frank ulcer seen.   implantable loop recorder removal  09/29/2019   MDT Reveal LINQ removed in office by Dr Rayann Heman   TEE WITHOUT CARDIOVERSION N/A 02/12/2016   Procedure: TRANSESOPHAGEAL ECHOCARDIOGRAM (TEE);  Surgeon: Jerline Pain, MD;  Location: [facility] ENDOSCOPY;  Service: Cardiovascular;  Laterality: N/A;   WIRE IN APEX OF RIGHT LUNG     Since childhood   Social History   Socioeconomic History   Marital status: Married    Spouse name: Not on file   Number of children: Not on file   Years of education: Not on file   Highest education level: Not on file  Occupational History   Occupation: maintenance, parking lots    Employer: SELF-EMPLOYED  Tobacco Use   Smoking status: Every Day    Packs/day: 0.50    Years: 34.00    Total pack years: 17.00    Types: Cigarettes    Start date: 04/08/1973   Smokeless tobacco: Never  Vaping Use   Vaping Use: Never used  Substance and Sexual Activity   Alcohol use: No    Alcohol/week: 0.0 standard drinks of alcohol   Drug use: No   Sexual activity: Yes    Partners: Female    Birth control/protection: Pill  Other Topics Concern   Not on file  Social History Narrative   Drinks Coffee 2 cups daily.  Lives sat home with wife.   Is self employed.  Graduated from High school.     Social Determinants of Health   Financial Resource Strain: Not on file  Food Insecurity: Not on file  Transportation Needs: Not on file  Physical Activity: Not on file  Stress: Not on file  Social Connections: Not on file   Outpatient Encounter Medications as of 08/22/2021  Medication Sig   Continuous Blood Gluc Receiver (FREESTYLE LIBRE 2 READER) DEVI As directed   Continuous Blood Gluc Sensor (FREESTYLE LIBRE 2 SENSOR) MISC 1 Piece by Does not apply route every 14 (fourteen) days.   acetaminophen (TYLENOL) 500 MG tablet Take 500 mg by mouth every 6 (six) hours as needed (for headaches).   albuterol (VENTOLIN HFA) 108 (90 Base) MCG/ACT inhaler Inhale 1-2 puffs into the lungs every 6 (six) hours as needed for wheezing or shortness of breath.   aspirin EC 81 MG EC tablet Take 1 tablet (81 mg total) by mouth daily. Swallow whole.   BD PEN NEEDLE NANO U/F 32G X 4 MM MISC USE AS DIRECTED FOUR  TIMES DAILY.   Continuous Blood Gluc Receiver (FREESTYLE LIBRE 2 READER) DEVI As directed   gabapentin (NEURONTIN) 100 MG capsule Take 100 mg by mouth daily.    HUMALOG MIX 75/25 KWIKPEN (75-25) 100 UNIT/ML KwikPen Inject 50 Units into the skin in the morning and at bedtime.   nitroGLYCERIN (NITROSTAT) 0.4 MG SL tablet DISSOLVE 1 TABLET UNDER TONGUE EVERY 5 MINUTES UP TO 15 MIN FOR CHESTPAIN. IF NO RELIEF CALL 911. (Patient taking differently: Place 0.4 mg under the tongue every 5 (five) minutes as needed for chest pain.)   nortriptyline (PAMELOR) 10 MG capsule Take 4 capsules (40 mg total) by mouth at bedtime.   pantoprazole (PROTONIX) 40 MG tablet Take 40 mg by mouth daily before breakfast.   rosuvastatin (CRESTOR) 20 MG tablet Take 1 tablet (20 mg total) by mouth daily.   traMADol (ULTRAM) 50 MG tablet Take 1 tablet (50 mg total) by mouth every 6 (six) hours as needed for moderate pain. (Patient taking differently: Take 50 mg by mouth daily.)   Ubrogepant (UBRELVY) 100 MG TABS Take 100 mg by mouth daily as needed. Take one tablet at onset of headache, may repeat 1 tablet in 2 hours, no more than 2 tablets in 24 hours   [DISCONTINUED] Continuous Blood Gluc Sensor (FREESTYLE LIBRE 14 DAY SENSOR) MISC APPLY ONE SENSOR EXTERNALLY EVERY 14 DAYS   No facility-administered encounter medications on file as of 08/22/2021.   ALLERGIES: Allergies  Allergen Reactions   Keppra [Levetiracetam]     Hallucination   Topamax [Topiramate]     irritable   VACCINATION STATUS:  There is no immunization history on file for this patient.  Diabetes He presents for his follow-up diabetic visit. He has type 2 diabetes mellitus. Onset time: He was diagnosed at approximate age of 85 years. In this particular patient with a history of heavy alcohol use possiblity of pancreatic diabetes is high. His disease course has been fluctuating. Pertinent negatives for hypoglycemia include no confusion, headaches, pallor,  seizures or speech difficulty. Pertinent negatives for diabetes include no chest pain, no fatigue, no polydipsia, no polyphagia, no polyuria and no weakness. There are no hypoglycemic complications. Symptoms are worsening. Diabetic complications include a CVA and heart disease. Risk factors for coronary artery disease include diabetes mellitus, dyslipidemia, hypertension, male sex, sedentary lifestyle and tobacco exposure. Current diabetic treatment includes oral agent (dual therapy). He is compliant with treatment some of the time. His weight is fluctuating minimally. He is following a generally unhealthy diet. When asked about meal planning, he reported none. He has not had a previous visit with a dietitian (he did not keep appointment.). He rarely participates in exercise. His home blood glucose trend is fluctuating  minimally. His breakfast blood glucose range is generally >200 mg/dl. His lunch blood glucose range is generally >200 mg/dl. His dinner blood glucose range is generally >200 mg/dl. His bedtime blood glucose range is generally >200 mg/dl. His overall blood glucose range is >200 mg/dl. (Mr. Kernodle presents with slightly improving glycemic profile.  His CGM was reviewed and analyzed with him.  AGP report shows 18% time in range, improving from 2% last visit.  82% above range improving from 98% during his last visit.  His recent A1c was 10.7%.  Average blood glucose for the last 14 days is 251, improving from 306 during his last visit.     ) Eye exam is current.  Hyperlipidemia This is a chronic problem. The current episode started more than 1 year ago. The problem is uncontrolled (He was convinced to take Crestor during his last visit, returns with significant improvement in his LDL from 135 down to 87, triglycerides from 396-215.). Exacerbating diseases include diabetes. Pertinent negatives include no chest pain, myalgias or shortness of breath. Current antihyperlipidemic treatment includes statins.  Risk factors for coronary artery disease include dyslipidemia, diabetes mellitus, hypertension and a sedentary lifestyle.  Hypertension This is a chronic problem. The current episode started more than 1 year ago. The problem is controlled. Pertinent negatives include no chest pain, headaches, neck pain, palpitations or shortness of breath. Risk factors for coronary artery disease include diabetes mellitus, dyslipidemia and smoking/tobacco exposure. Past treatments include beta blockers. Hypertensive end-organ damage includes CAD/MI and CVA.    Review of Systems  Constitutional:  Negative for fatigue and unexpected weight change.  HENT:  Negative for dental problem, mouth sores and trouble swallowing.   Eyes:  Negative for visual disturbance.  Respiratory:  Negative for cough, choking, chest tightness, shortness of breath and wheezing.   Cardiovascular:  Negative for chest pain, palpitations and leg swelling.  Gastrointestinal:  Negative for abdominal distention, abdominal pain, constipation, diarrhea, nausea and vomiting.  Endocrine: Negative for polydipsia, polyphagia and polyuria.  Genitourinary:  Negative for dysuria, flank pain, hematuria and urgency.  Musculoskeletal:  Negative for back pain, gait problem, myalgias and neck pain.  Skin:  Negative for pallor, rash and wound.  Neurological:  Negative for seizures, syncope, speech difficulty, weakness, numbness and headaches.  Psychiatric/Behavioral:  Negative for confusion and dysphoric mood.     Objective:    BP 110/68   Pulse 96   Ht 5\' 9"  (1.753 m)   Wt 168 lb 9.6 oz (76.5 kg)   BMI 24.90 kg/m   Wt Readings from Last 3 Encounters:  08/22/21 168 lb 9.6 oz (76.5 kg)  07/16/21 172 lb (78 kg)  04/15/21 170 lb 9.6 oz (77.4 kg)      Results for orders placed or performed in visit on 07/16/21  HgB A1c  Result Value Ref Range   Hemoglobin A1C     HbA1c POC (<> result, manual entry)     HbA1c, POC (prediabetic range)     HbA1c,  POC (controlled diabetic range) 10.7 (A) 0.0 - 7.0 %   Diabetic Labs (most recent): Lab Results  Component Value Date   HGBA1C 10.7 (A) 07/16/2021   HGBA1C 10.1 (A) 04/15/2021   HGBA1C 9.7 (A) 01/03/2021   Lipid Panel     Component Value Date/Time   CHOL 183 07/10/2021 0838   TRIG 284 (H) 07/10/2021 0838   HDL 31 (L) 07/10/2021 0838   CHOLHDL 5.9 (H) 07/10/2021 0838   CHOLHDL 7.7 (H) 08/30/2020 4970  VLDL 20 05/11/2020 0224   LDLCALC 103 (H) 07/10/2021 0838   LDLCALC 139 (H) 08/30/2020 0741     Assessment & Plan:   1. Type 2 diabetes mellitus with vascular disease (HCC) -His diabetes is  complicated by coronary artery disease and patient remains at a high risk for more acute and chronic complications of diabetes which include CAD, CVA, CKD, retinopathy, and neuropathy. These are all discussed in detail with the patient.  Mr. Vassar presents with slightly improving glycemic profile.  His CGM was reviewed and analyzed with him.  AGP report shows 18% time in range, improving from 2% last visit.  82% above range improving from 98% during his last visit.  His recent A1c was 10.7%.  Average blood glucose for the last 14 days is 251, improving from 306 during his last visit.    - I have re-counseled the patient on diet management  by adopting a carbohydrate restricted / protein rich  Diet.  - Suggestion is made for him to avoid simple carbohydrates  from his diet including Cakes, Sweet Desserts, Ice Cream, Soda (diet and regular), Sweet Tea, Candies, Chips, Cookies, Store Bought Juices, Alcohol in Excess of  1-2 drinks a day, Artificial Sweeteners,  Coffee Creamer, and "Sugar-free" Products, Lemonade. This will help patient to have more stable blood glucose profile and potentially avoid unintended weight gain.  - Patient is advised to stick to a routine mealtimes to eat 3 meals  a day and avoid unnecessary snacks ( to snack only to correct hypoglycemia).  - I have approached patient with  the following individualized plan to manage diabetes and patient agrees.  Tyrelle remains alarmingly noncompliant/nonadherent.  He failed to utilize his recommended treatment options.  He is still not utilizing his CGM adequately.  He did not comply with basal/bolus insulin previously tried.  He was switched to premixed insulin so he can use twice a day.   He is approached again for proper engagement for basic utility of his CGM device and his premixed insulin.  He is advised to increase his Humalog 75/25 to 50 units with breakfast and 50 units with supper   when Premeal blood glucose readings are above 90 mg per DL.    -He is advised to continue monitoring blood glucose continuously, at least 4 times a day.    -He is warned not to inject insulin without proper monitoring of blood glucose.  - He is not the right candidate for Invokana nor incretintherapy.  -Target numbers for A1c, LDL, HDL, Triglycerides,  were discussed in detail.   2) BP/HTN:  -His blood pressure is controlled to target.  He is advised to continue his current medications including beta blockers.  He has marginal blood pressure, he will not tolerate additional ACE inhibitor's.     3) Lipids/HPL: His lipid panel shows worsening profile once again, LDL at 103 from 87.  He is advised to continue Crestor 20 mg p.o. nightly.      Side effects and precautions discussed with him.   4)  Weight/Diet: His BMI is 24.90-  He is not a candidate for weight loss.  He is  following with  CDE consult, exercise, and carbohydrates information provided.    5) Chronic Care/Health Maintenance:  -Patient is  Statin medications and encouraged to continue to follow up with Ophthalmology, Podiatrist at least yearly or according to recommendations, and advised to  quit Smoking (tragically he resumed smoking after he quit briefly during his diagnosis of CVA ).  I have recommended yearly flu vaccine and pneumonia vaccination at least every 5 years;  moderate intensity exercise for up to 150 minutes weekly; and  sleep for at least 7 hours a day.  The patient was counseled on the dangers of tobacco use, and was advised to quit.  Reviewed strategies to maximize success, including removing cigarettes and smoking materials from environment.  He recently had normal ABI, will be repeated in November 2026,  or sooner if needed. I advised patient to maintain close follow up with his PCP for primary care needs.   I spent 40 minutes in the care of the patient today including review of labs from [place], Lipids, Thyroid Function, Hematology (current and previous including abstractions from other facilities); face-to-face time discussing  his blood glucose readings/logs, discussing hypoglycemia and hyperglycemia episodes and symptoms, medications doses, his options of short and long term treatment based on the latest standards of care / guidelines;  discussion about incorporating lifestyle medicine;  and documenting the encounter. Risk reduction counseling performed per USPSTF guidelines to reduce obesity and cardiovascular risk factors.     Please refer to Patient Instructions for Blood Glucose Monitoring and Insulin/Medications Dosing Guide"  in media tab for additional information. Please  also refer to " Patient Self Inventory" in the Media  tab for reviewed elements of pertinent patient history.  Nicholas [name] participated in the discussions, expressed understanding, and voiced agreement with the above plans.  All questions were answered to his satisfaction. he is encouraged to contact clinic should he have any questions or concerns prior to his return visit.    Follow up plan: Return in about 3 months (around 11/22/2021) for Bring Meter/CGM Device/Logs- A1c in Office.  Glade Lloyd, MD Phone: [phone]  Fax: [phone]  This note was partially dictated with voice recognition software. Similar sounding words can be transcribed inadequately or may  not  be corrected upon review.  08/22/2021, 12:05 PM

## 2021-08-27 ENCOUNTER — Other Ambulatory Visit: Payer: Self-pay | Admitting: Cardiology

## 2021-09-11 NOTE — Patient Instructions (Signed)
Below is our plan:  We will continue nortriptyline 40mg  daily and Ubrelvy as needed.   Continue close follow up with care team. Work on getting A1C down. Continue rosuvastatin and asa for stroke prevention.   Please make sure you are staying well hydrated. I recommend 50-60 ounces daily. Well balanced diet and regular exercise encouraged. Consistent sleep schedule with 6-8 hours recommended.   Please continue follow up with care team as directed.   Follow up with me in 1 year   You may receive a survey regarding today's visit. I encourage you to leave honest feed back as I do use this information to improve patient care. Thank you for seeing me today!

## 2021-09-11 NOTE — Progress Notes (Signed)
Chief Complaint  Patient presents with   Follow-up    Rm 1, w wife. Here to f/u for migraines. Pt reports doing well. Has not had migraines since last OV. Does have a few mild HA but overall improved as well. Sneezing still causing HA pain and still feeling dizzy when bending over.     HISTORY OF PRESENT ILLNESS:  09/16/21 ALL:  Nicholas [name] returns for follow up for migraines. He was last seen 02/2021 and doing fairly well but called in 04/2021 with worsening headaches so we increased nortriptyline to 40mg  daily and continued Ubrelvy as needed. He reports doing well from a headache standpoint. He may have an occasional tension headache but is easily aborted. Has taken Ubrelvy 1-2 times and it worked well. He reports increase in A1C over the past year from 7 to 10. He is working closely with endocrinology. He admits that he does not drink much water and drinks more caffeine. He continues rosuvastatin and asa. No stroke like symptoms.   03/18/2021 ALL:  Nicholas [name] returns for migraines follow up. He continues nortriptyline 30mg  daily at bedtime and rizatriptan as needed. He feels that headaches are usually well managed. He has had milder tension type headaches about 4 times a month. He has taken rizatriptan about 10 times over the past 5 months to avoid migraine. He had one intractable migraine 1/23 and started on divalproex taper. He took divalproex 500mg  for two days and had visual disturbances (zig zag lines, halos). Symptoms improved with decreased dose to 250mg  daily. Headache was aborted with taper. He feels that he has been doing pretty well, since. He admits that he does not drink water. He usually drinks coffee or green tea.   He weaned Vimpat as directed. He denies stroke or seizure symptoms. He is taking asa 81mg  and rosuvastatin 10mg  daily (increased in 12/2020). A1C was 9.5 in 12/2020. He is taking Humalog 75/25 30 units twice daily. LDL was 89, triglycerides were 215. He is followed by PCP and endo  regularly.   11/07/2020 ALL:  Nicholas [name] is a 61 y.o. male here today for follow up for headaches and subdural hematoma. Follow up CT reassuring for resolving subdural 06/2020. He continues Vimpat 500mg  BID for concerns of possible seizure like activity most likely complicated migraine. No obvious seizure activity. He was started on topiramate for complex migraines but could not tolerate and was switched to nortriptyline. He reports headaches are significantly better. He has had two migraines that he was able to abort with rizatriptan, however, has not had any significant migraines in a couple of months. He titrated nortriptyline to 30mg  about 4 weeks ago and is tolerating well. May take Tylenol 1-2 times a week for mild headaches. He is feeling well, today. He continues rosuvastatin and asa 81mg  for stroke prevention.    HISTORY (copied from Dr Jannifer Franklin' previous note)  Nicholas [name] is a 61 year old right-handed white male with a history of cerebrovascular disease with a prior small left occipital stroke in the past.  The patient has history of coronary artery disease, diabetes, and ongoing tobacco abuse.  He continues to smoke a pack of cigarettes daily.  He has gastroesophageal reflux disease.  The patient was admitted to the hospital on 09 May 2020 with a visual disturbance, headache, and some confusion/aphasia.  The patient reported that in February 2022 that he bumped his head when he fell and hit the left frontotemporal area of his head on a tree.  The patient  did have some minor headaches afterwards but this was not an issue for him.  In the third week of March 2022, he went skeet shooting and noted that when he fired a shotgun that he would get an uncomfortable sensation in the head, if he did not use this shotgun, the sensation did not occur.  On the April 13 admission, he presented with some tunnel vision issues, he had visual disturbance on both visual fields in the periphery and had preservation  of center vision.  He had a severe headache, and he also appeared to be confused, he was trying to talk but had garbled speech that was not understandable.  When he went to the hospital, CT and MRI evaluation demonstrated bilateral small subdural hematoma.  No stroke was noted.  The patient was taken off of Plavix but kept on aspirin.  He was transiently treated with Keppra but was taken off the medication upon discharge.  The patient returned to the hospital on 21 May 2020 with some headache and slight confusion but no visual disturbance.  The patient at that point was placed on Vimpat and has remained on 50 mg twice daily.  The patient has had some minor headaches since coming out of the hospital but no severe headaches or visual disturbance or aphasia has been noted.  The patient reported no numbness or weakness of the face, arms, or legs.  He denied any gait disturbance or difficulty controlling the bowels or the bladder.  He does have some minor neck discomfort.  The patient comes to this office for further evaluation.  He remains on aspirin, off the Plavix.  He has not been operating a motor vehicle.   REVIEW OF SYSTEMS: Out of a complete 14 system review of symptoms, the patient complains only of the following symptoms, headaches and all other reviewed systems are negative.   ALLERGIES: Allergies  Allergen Reactions   Keppra [Levetiracetam]     Hallucination   Topamax [Topiramate]     irritable     HOME MEDICATIONS: Outpatient Medications Prior to Visit  Medication Sig Dispense Refill   acetaminophen (TYLENOL) 500 MG tablet Take 500 mg by mouth every 6 (six) hours as needed (for headaches).     albuterol (VENTOLIN HFA) 108 (90 Base) MCG/ACT inhaler Inhale 1-2 puffs into the lungs every 6 (six) hours as needed for wheezing or shortness of breath.     aspirin EC 81 MG EC tablet Take 1 tablet (81 mg total) by mouth daily. Swallow whole. 30 tablet 11   BD PEN NEEDLE NANO U/F 32G X 4 MM MISC  USE AS DIRECTED FOUR TIMES DAILY. 100 each 2   Continuous Blood Gluc Receiver (FREESTYLE LIBRE 2 READER) DEVI As directed 1 each 0   Continuous Blood Gluc Receiver (FREESTYLE LIBRE 2 READER) DEVI As directed 1 each 0   Continuous Blood Gluc Sensor (FREESTYLE LIBRE 2 SENSOR) MISC 1 Piece by Does not apply route every 14 (fourteen) days. 2 each 3   gabapentin (NEURONTIN) 100 MG capsule Take 100 mg by mouth daily.      Insulin Lispro Prot & Lispro (HUMALOG MIX 75/25 KWIKPEN) (75-25) 100 UNIT/ML Kwikpen Inject 50 Units into the skin 2 (two) times daily with a meal. 30 mL 2   nitroGLYCERIN (NITROSTAT) 0.4 MG SL tablet DISSOLVE ONE TABLET UNDER THE TONGUE EVERY 5 MINUTES AS NEEDED FOR CHEST PAIN.  DO NOT EXCEED A TOTAL OF 3 DOSES IN 15 MINUTES 25 tablet 1   pantoprazole (  PROTONIX) 40 MG tablet Take 40 mg by mouth daily before breakfast.     rosuvastatin (CRESTOR) 20 MG tablet Take 1 tablet (20 mg total) by mouth daily. (Patient taking differently: Take 20 mg by mouth every evening.) 90 tablet 1   traMADol (ULTRAM) 50 MG tablet Take 1 tablet (50 mg total) by mouth every 6 (six) hours as needed for moderate pain. (Patient taking differently: Take 50 mg by mouth daily.) 15 tablet 0   Ubrogepant (UBRELVY) 100 MG TABS Take 100 mg by mouth daily as needed. Take one tablet at onset of headache, may repeat 1 tablet in 2 hours, no more than 2 tablets in 24 hours 10 tablet 11   nortriptyline (PAMELOR) 10 MG capsule Take 4 capsules (40 mg total) by mouth at bedtime. 360 capsule 1   No facility-administered medications prior to visit.     PAST MEDICAL HISTORY: Past Medical History:  Diagnosis Date   Anal lesion    Anal papilla/tags - external lesion   Bilateral carpal tunnel syndrome 11/24/2018   Colon polyp    Hyperplastic rectosigmoid polyp 9/08   Coronary atherosclerosis of native coronary artery    DES to PLB, BMS to mid and distal RCA, DES to circumflex and distal LAD, 6/10 (5 stents)   Diabetic  peripheral neuropathy ([place]) 11/24/2018   GERD (gastroesophageal reflux disease)    Helicobacter pylori (H. pylori) 09/2009   Treated with helidac   Hiatal hernia    Hyperplastic colon polyp 2008   Mixed hyperlipidemia    Myocardial infarction ([place]) 06/2008   Seizures ([place])    Stroke [facility])    January 2018   Type 2 diabetes mellitus ([place])      PAST SURGICAL HISTORY: Past Surgical History:  Procedure Laterality Date   CHOLECYSTECTOMY     COLLAPSED LUNG     15 years ago   COLONOSCOPY  10/12/2006   Dr. Gala Romney- rectosigmoid polyp s/phot snare polypectomy o/w normal rectum and terminal ileum. hyperplastic polyp on bx   COLONOSCOPY WITH PROPOFOL N/A 09/01/2019   Procedure: COLONOSCOPY WITH PROPOFOL;  Surgeon: Daneil Dolin, MD;  Location: [facility];  Service: Endoscopy;  Laterality: N/A;  8:30am   EP IMPLANTABLE DEVICE N/A 02/12/2016   Procedure: Loop Recorder Insertion;  Surgeon: Thompson Grayer, MD;  Location: [facility];  Service: Cardiovascular;  Laterality: N/A;   ESOPHAGOGASTRODUODENOSCOPY  10/12/2006   Dr. Gala Romney- examination of the tubular esophagus revealed no mucosal abnormalities. the EG junction was easily traversed. small hiatal hernia, the gastric mucosa o/w appeared normal. there was no infiltrating process or frank ulcer seen.   implantable loop recorder removal  09/29/2019   MDT Reveal LINQ removed in office by Dr Rayann Heman   TEE WITHOUT CARDIOVERSION N/A 02/12/2016   Procedure: TRANSESOPHAGEAL ECHOCARDIOGRAM (TEE);  Surgeon: Jerline Pain, MD;  [facility] END[facility]COPY;  Service: Cardiovascular;  Laterality: N/A;   WIRE IN APEX OF RIGHT LUNG     Since childhood     FAMILY HISTORY: Family History  Problem Relation Age of Onset   Diabetes Mother    Heart disease Father    Cancer Brother    CVA Neg Hx      SOCIAL HISTORY: Social History   Socioeconomic History   Marital status: Married    Spouse name: Not on file   Number of children: Not on file   Years of  education: Not on file   Highest education level: Not on file  Occupational History  Occupation: maintenance, parking lots    Employer: SELF-EMPLOYED  Tobacco Use   Smoking status: Every Day    Packs/day: 0.50    Years: 34.00    Total pack years: 17.00    Types: Cigarettes    Start date: 04/08/1973   Smokeless tobacco: Never  Vaping Use   Vaping Use: Never used  Substance and Sexual Activity   Alcohol use: No    Alcohol/week: 0.0 standard drinks of alcohol   Drug use: No   Sexual activity: Yes    Partners: Female    Birth control/protection: Pill  Other Topics Concern   Not on file  Social History Narrative   Drinks Coffee 2 cups daily.  Lives sat home with wife.   Is self employed.  Graduated from High school.     Social Determinants of Health   Financial Resource Strain: Not on file  Food Insecurity: Not on file  Transportation Needs: Not on file  Physical Activity: Not on file  Stress: Not on file  Social Connections: Not on file  Intimate Partner Violence: Not on file     PHYSICAL EXAM  Vitals:   09/16/21 0940  BP: 124/79  Pulse: (!) 108  Weight: 169 lb 8 oz (76.9 kg)  Height: 5\' 9"  (1.753 m)     Body mass index is 25.03 kg/m.  Generalized: Well developed, in no acute distress  Cardiology: normal rate and rhythm, no murmur auscultated  Respiratory: clear to auscultation bilaterally    Neurological examination  Mentation: Alert oriented to time, place, history taking. Follows all commands speech and language fluent Cranial nerve II-XII: Pupils were equal round reactive to light. Extraocular movements were full, visual field were full on confrontational test. Facial sensation and strength were normal. Head turning and shoulder shrug  were normal and symmetric. Motor: The motor testing reveals 5 over 5 strength of all 4 extremities. Good symmetric motor tone is noted throughout.  Sensory: Sensory testing is intact to soft touch on all 4 extremities. No  evidence of extinction is noted.  Coordination: Cerebellar testing reveals good finger-nose-finger and heel-to-shin bilaterally.  Gait and station: Gait is normal.  Reflexes: Deep tendon reflexes are symmetric and normal bilaterally.    DIAGNOSTIC DATA (LABS, IMAGING, TESTING) - I reviewed patient records, labs, notes, testing and imaging myself where available.  Lab Results  Component Value Date   WBC 12.2 (H) 07/24/2020   HGB 15.5 07/24/2020   HCT 47.5 07/24/2020   MCV 83.9 07/24/2020   PLT 175 07/24/2020      Component Value Date/Time   NA 138 07/10/2021 0838   K 5.1 07/10/2021 0838   CL 101 07/10/2021 0838   CO2 22 07/10/2021 0838   GLUCOSE 308 (H) 07/10/2021 0838   GLUCOSE 207 (H) 07/24/2020 2124   BUN 19 07/10/2021 0838   CREATININE 0.99 07/10/2021 0838   CREATININE 0.78 05/02/2019 0918   CALCIUM 9.3 07/10/2021 0838   PROT 7.0 07/10/2021 0838   ALBUMIN 4.3 07/10/2021 0838   AST 17 07/10/2021 0838   ALT 29 07/10/2021 0838   ALKPHOS 92 07/10/2021 0838   BILITOT 0.2 07/10/2021 0838   GFRNONAA >60 07/24/2020 2124   GFRNONAA 98 09/02/2018 0725   GFRAA 115 10/20/2018 0954   GFRAA 114 09/02/2018 0725   Lab Results  Component Value Date   CHOL 183 07/10/2021   HDL 31 (L) 07/10/2021   LDLCALC 103 (H) 07/10/2021   TRIG 284 (H) 07/10/2021   CHOLHDL 5.9 (H) 07/10/2021  Lab Results  Component Value Date   HGBA1C 10.7 (A) 07/16/2021   Lab Results  Component Value Date   GYKZLDJT70 177 02/11/2016   Lab Results  Component Value Date   TSH 2.570 07/10/2021        No data to display               No data to display           ASSESSMENT AND PLAN  61 y.o. year old male  has a past medical history of Anal lesion, Bilateral carpal tunnel syndrome (11/24/2018), Colon polyp, Coronary atherosclerosis of native coronary artery, Diabetic peripheral neuropathy ([place]) (11/24/2018), GERD (gastroesophageal reflux disease), Helicobacter pylori (H. pylori)  (09/2009), Hiatal hernia, Hyperplastic colon polyp (2008), Mixed hyperlipidemia, Myocardial infarction ([place]) (06/2008), Seizures ([place]), Stroke ([place]), and Type 2 diabetes mellitus ([place]). here with    Complicated migraine  History of stroke  Nicholas [name] is doing well, today. No seizure activity and headaches are significantly improved. No complex symptoms since April 2022. We will continue nortriptyline 40mg  daily at bedtime. He will continue [place] as needed. Will avoid triptans due to stroke history. We have reviewed last A1C and stroke risk factors. He will follow up closely with endocrinology.  I have encouraged him to increase water intake. Healthy lifestyle habits encouraged. He will continue stroke prevention and co morbidity follow up with PCP. He will return to see me in 1 year. He verbalizes understanding and agreement with this plan.    No orders of the defined types were placed in this encounter.    Meds ordered this encounter  Medications   nortriptyline (PAMELOR) 10 MG capsule    Sig: Take 4 capsules (40 mg total) by mouth at bedtime.    Dispense:  360 capsule    Refill:  3    Order Specific Question:   Supervising Provider    Answer:   Melvenia Beam [[id number]]       Debbora Presto, MSN, FNP-C 09/16/2021, 10:54 AM  [facility] [address], [place], [place] [address] [phone]

## 2021-09-12 ENCOUNTER — Other Ambulatory Visit: Payer: Self-pay | Admitting: "Endocrinology

## 2021-09-16 ENCOUNTER — Encounter: Payer: Self-pay | Admitting: Family Medicine

## 2021-09-16 ENCOUNTER — Ambulatory Visit: Payer: BC Managed Care – PPO | Admitting: Family Medicine

## 2021-09-16 VITALS — BP 124/79 | HR 108 | Ht 69.0 in | Wt 169.5 lb

## 2021-09-16 DIAGNOSIS — Z8673 Personal history of transient ischemic attack (TIA), and cerebral infarction without residual deficits: Secondary | ICD-10-CM | POA: Diagnosis not present

## 2021-09-16 DIAGNOSIS — G43109 Migraine with aura, not intractable, without status migrainosus: Secondary | ICD-10-CM | POA: Diagnosis not present

## 2021-09-16 MED ORDER — NORTRIPTYLINE HCL 10 MG PO CAPS
40.0000 mg | ORAL_CAPSULE | Freq: Every day | ORAL | 3 refills | Status: DC
Start: 2021-09-16 — End: 2022-09-16

## 2021-12-04 ENCOUNTER — Encounter: Payer: Self-pay | Admitting: "Endocrinology

## 2021-12-04 ENCOUNTER — Ambulatory Visit: Payer: BC Managed Care – PPO | Admitting: "Endocrinology

## 2021-12-04 VITALS — BP 114/78 | HR 100 | Ht 69.0 in | Wt 170.6 lb

## 2021-12-04 DIAGNOSIS — E559 Vitamin D deficiency, unspecified: Secondary | ICD-10-CM

## 2021-12-04 DIAGNOSIS — I10 Essential (primary) hypertension: Secondary | ICD-10-CM

## 2021-12-04 DIAGNOSIS — E1159 Type 2 diabetes mellitus with other circulatory complications: Secondary | ICD-10-CM

## 2021-12-04 DIAGNOSIS — Z91199 Patient's noncompliance with other medical treatment and regimen due to unspecified reason: Secondary | ICD-10-CM

## 2021-12-04 DIAGNOSIS — E782 Mixed hyperlipidemia: Secondary | ICD-10-CM

## 2021-12-04 LAB — POCT GLYCOSYLATED HEMOGLOBIN (HGB A1C): HbA1c, POC (controlled diabetic range): 11.2 % — AB (ref 0.0–7.0)

## 2021-12-04 NOTE — Progress Notes (Signed)
12/04/2021  Endocrinology follow-up note  Subjective:    Patient ID: Nicholas [name], male    DOB: 1960-09-22,    Past Medical History:  Diagnosis Date   Anal lesion    Anal papilla/tags - external lesion   Bilateral carpal tunnel syndrome 11/24/2018   Colon polyp    Hyperplastic rectosigmoid polyp 9/08   Coronary atherosclerosis of native coronary artery    DES to PLB, BMS to mid and distal RCA, DES to circumflex and distal LAD, 6/10 (5 stents)   Diabetic peripheral neuropathy ([place]) 11/24/2018   GERD (gastroesophageal reflux disease)    Helicobacter pylori (H. pylori) 09/2009   Treated with helidac   Hiatal hernia    Hyperplastic colon polyp 2008   Mixed hyperlipidemia    Myocardial infarction ([place]) 06/2008   Seizures ([place])    Stroke [facility])    January 2018   Type 2 diabetes mellitus [facility])    Past Surgical History:  Procedure Laterality Date   CHOLECYSTECTOMY     COLLAPSED LUNG     15 years ago   COLONOSCOPY  10/12/2006   Dr. Gala Romney- rectosigmoid polyp s/phot snare polypectomy o/w normal rectum and terminal ileum. hyperplastic polyp on bx   COLONOSCOPY WITH PROPOFOL N/A 09/01/2019   Procedure: COLONOSCOPY WITH PROPOFOL;  Surgeon: Daneil Dolin, MD;  Location: [facility];  Service: Endoscopy;  Laterality: N/A;  8:30am   EP IMPLANTABLE DEVICE N/A 02/12/2016   Procedure: Loop Recorder Insertion;  Surgeon: Thompson Grayer, MD;  Location: [facility];  Service: Cardiovascular;  Laterality: N/A;   ESOPHAGOGASTRODUODENOSCOPY  10/12/2006   Dr. Gala Romney- examination of the tubular esophagus revealed no mucosal abnormalities. the EG junction was easily traversed. small hiatal hernia, the gastric mucosa o/w appeared normal. there was no infiltrating process or frank ulcer seen.   implantable loop recorder removal  09/29/2019   MDT Reveal LINQ removed in office by Dr Rayann Heman   TEE WITHOUT CARDIOVERSION N/A 02/12/2016   Procedure: TRANSESOPHAGEAL ECHOCARDIOGRAM (TEE);  Surgeon: Jerline Pain, MD;  Location: [facility] ENDOSCOPY;  Service: Cardiovascular;  Laterality: N/A;   WIRE IN APEX OF RIGHT LUNG     Since childhood   Social History   Socioeconomic History   Marital status: Married    Spouse name: Not on file   Number of children: Not on file   Years of education: Not on file   Highest education level: Not on file  Occupational History   Occupation: maintenance, parking lots    Employer: SELF-EMPLOYED  Tobacco Use   Smoking status: Every Day    Packs/day: 0.50    Years: 34.00    Total pack years: 17.00    Types: Cigarettes    Start date: 04/08/1973   Smokeless tobacco: Never  Vaping Use   Vaping Use: Never used  Substance and Sexual Activity   Alcohol use: No    Alcohol/week: 0.0 standard drinks of alcohol   Drug use: No   Sexual activity: Yes    Partners: Female    Birth control/protection: Pill  Other Topics Concern   Not on file  Social History Narrative   Drinks Coffee 2 cups daily.  Lives sat home with wife.   Is self employed.  Graduated from High school.     Social Determinants of Health   Financial Resource Strain: Not on file  Food Insecurity: Not on file  Transportation Needs: Not on file  Physical Activity: Not on file  Stress: Not on file  Social Connections: Not on file   Outpatient Encounter Medications as of 12/04/2021  Medication Sig   acetaminophen (TYLENOL) 500 MG tablet Take 500 mg by mouth every 6 (six) hours as needed (for headaches).   albuterol (VENTOLIN HFA) 108 (90 Base) MCG/ACT inhaler Inhale 1-2 puffs into the lungs every 6 (six) hours as needed for wheezing or shortness of breath.   aspirin EC 81 MG EC tablet Take 1 tablet (81 mg total) by mouth daily. Swallow whole.   BD PEN NEEDLE NANO U/F 32G X 4 MM MISC USE AS DIRECTED FOUR TIMES DAILY.   Continuous Blood Gluc Receiver (FREESTYLE LIBRE 2 READER) DEVI As directed   Continuous Blood Gluc Receiver (FREESTYLE LIBRE 2 READER) DEVI As directed   Continuous Blood Gluc Sensor  (FREESTYLE LIBRE 2 SENSOR) MISC 1 Piece by Does not apply route every 14 (fourteen) days.   gabapentin (NEURONTIN) 100 MG capsule Take 100 mg by mouth daily.    Insulin Lispro Prot & Lispro (HUMALOG MIX 75/25 KWIKPEN) (75-25) 100 UNIT/ML Kwikpen Inject 50 Units into the skin 2 (two) times daily with a meal.   nitroGLYCERIN (NITROSTAT) 0.4 MG SL tablet DISSOLVE ONE TABLET UNDER THE TONGUE EVERY 5 MINUTES AS NEEDED FOR CHEST PAIN.  DO NOT EXCEED A TOTAL OF 3 DOSES IN 15 MINUTES   nortriptyline (PAMELOR) 10 MG capsule Take 4 capsules (40 mg total) by mouth at bedtime.   pantoprazole (PROTONIX) 40 MG tablet Take 40 mg by mouth daily before breakfast.   rosuvastatin (CRESTOR) 20 MG tablet Take 1 tablet (20 mg total) by mouth daily. (Patient taking differently: Take 20 mg by mouth every evening.)   traMADol (ULTRAM) 50 MG tablet Take 1 tablet (50 mg total) by mouth every 6 (six) hours as needed for moderate pain. (Patient taking differently: Take 50 mg by mouth daily.)   Ubrogepant (UBRELVY) 100 MG TABS Take 100 mg by mouth daily as needed. Take one tablet at onset of headache, may repeat 1 tablet in 2 hours, no more than 2 tablets in 24 hours   No facility-administered encounter medications on file as of 12/04/2021.   ALLERGIES: Allergies  Allergen Reactions   Keppra [Levetiracetam]     Hallucination   Topamax [Topiramate]     irritable   VACCINATION STATUS:  There is no immunization history on file for this patient.  Diabetes He presents for his follow-up diabetic visit. He has type 2 diabetes mellitus. Onset time: He was diagnosed at approximate age of 73 years. In this particular patient with a history of heavy alcohol use possiblity of pancreatic diabetes is high. His disease course has been worsening. Pertinent negatives for hypoglycemia include no confusion, headaches, pallor, seizures or speech difficulty. Pertinent negatives for diabetes include no chest pain, no fatigue, no polydipsia, no  polyphagia, no polyuria and no weakness. There are no hypoglycemic complications. Symptoms are worsening. Diabetic complications include a CVA and heart disease. Risk factors for coronary artery disease include diabetes mellitus, dyslipidemia, hypertension, male sex, sedentary lifestyle and tobacco exposure. Current diabetic treatment includes oral agent (dual therapy). He is compliant with treatment some of the time. His weight is fluctuating minimally. He is following a generally unhealthy diet. When asked about meal planning, he reported none. He has not had a previous visit with a dietitian (he did not keep appointment.). He rarely participates in exercise. His home blood glucose trend is fluctuating minimally. His breakfast blood glucose range is generally >200 mg/dl. His lunch blood glucose range  is generally >200 mg/dl. His dinner blood glucose range is generally >200 mg/dl. His bedtime blood glucose range is generally >200 mg/dl. His overall blood glucose range is >200 mg/dl. (Mr. Lafon presents with worsening glycemic profile.  His AGP report shows 0% in range, 7% 11 1 hypoglycemia, 93% average 2 hyperglycemia.  His average blood glucose is 331 for the last 14 days.  Admittedly, he has not been injecting his insulin as ordered.  He needs to at least 50% of his injection opportunities.  His point-of-care A1c is 11.2%  increasing from 10.7% during his last visit.  ) Eye exam is current.  Hyperlipidemia This is a chronic problem. The current episode started more than 1 year ago. The problem is uncontrolled (He was convinced to take Crestor during his last visit, returns with significant improvement in his LDL from 135 down to 87, triglycerides from 396-215.). Exacerbating diseases include diabetes. Pertinent negatives include no chest pain, myalgias or shortness of breath. Current antihyperlipidemic treatment includes statins. Risk factors for coronary artery disease include dyslipidemia, diabetes  mellitus, hypertension and a sedentary lifestyle.  Hypertension This is a chronic problem. The current episode started more than 1 year ago. The problem is controlled. Pertinent negatives include no chest pain, headaches, neck pain, palpitations or shortness of breath. Risk factors for coronary artery disease include diabetes mellitus, dyslipidemia and smoking/tobacco exposure. Past treatments include beta blockers. Hypertensive end-organ damage includes CAD/MI and CVA.    Review of Systems  Constitutional:  Negative for fatigue and unexpected weight change.  HENT:  Negative for dental problem, mouth sores and trouble swallowing.   Eyes:  Negative for visual disturbance.  Respiratory:  Negative for cough, choking, chest tightness, shortness of breath and wheezing.   Cardiovascular:  Negative for chest pain, palpitations and leg swelling.  Gastrointestinal:  Negative for abdominal distention, abdominal pain, constipation, diarrhea, nausea and vomiting.  Endocrine: Negative for polydipsia, polyphagia and polyuria.  Genitourinary:  Negative for dysuria, flank pain, hematuria and urgency.  Musculoskeletal:  Negative for back pain, gait problem, myalgias and neck pain.  Skin:  Negative for pallor, rash and wound.  Neurological:  Negative for seizures, syncope, speech difficulty, weakness, numbness and headaches.  Psychiatric/Behavioral:  Negative for confusion and dysphoric mood.     Objective:    BP 114/78   Pulse 100   Ht 5\' 9"  (1.753 m)   Wt 170 lb 9.6 oz (77.4 kg)   BMI 25.19 kg/m   Wt Readings from Last 3 Encounters:  12/04/21 170 lb 9.6 oz (77.4 kg)  09/16/21 169 lb 8 oz (76.9 kg)  08/22/21 168 lb 9.6 oz (76.5 kg)      Results for orders placed or performed in visit on 12/04/21  HgB A1c  Result Value Ref Range   Hemoglobin A1C     HbA1c POC (<> result, manual entry)     HbA1c, POC (prediabetic range)     HbA1c, POC (controlled diabetic range) 11.2 (A) 0.0 - 7.0 %    Diabetic Labs (most recent): Lab Results  Component Value Date   HGBA1C 11.2 (A) 12/04/2021   HGBA1C 10.7 (A) 07/16/2021   HGBA1C 10.1 (A) 04/15/2021   Lipid Panel     Component Value Date/Time   CHOL 183 07/10/2021 0838   TRIG 284 (H) 07/10/2021 0838   HDL 31 (L) 07/10/2021 0838   CHOLHDL 5.9 (H) 07/10/2021 0838   CHOLHDL 7.7 (H) 08/30/2020 0741   VLDL 20 05/11/2020 0224   LDLCALC 103 (H)  07/10/2021 0838   LDLCALC 139 (H) 08/30/2020 0741     Assessment & Plan:   1. Type 2 diabetes mellitus with vascular disease (HCC) -His diabetes is  complicated by alarming noncompliance/nonadherence, coronary artery disease and patient remains at a high risk for more acute and chronic complications of diabetes which include CAD, CVA, CKD, retinopathy, and neuropathy. These are all discussed in detail with the patient.  Mr. Vandyke presents with worsening glycemic profile.  His AGP report shows 0% in range, 7% 11 1 hypoglycemia, 93% average 2 hyperglycemia.  His average blood glucose is 331 for the last 14 days.  Admittedly, he has not been injecting his insulin as ordered.  He needs to at least 50% of his injection opportunities.  His point-of-care A1c is 11.2%  increasing from 10.7% during his last visit.   - Suggestion is made for him to avoid simple carbohydrates  from his diet including Cakes, Sweet Desserts, Ice Cream, Soda (diet and regular), Sweet Tea, Candies, Chips, Cookies, Store Bought Juices, Alcohol in Excess of  1-2 drinks a day, Artificial Sweeteners,  Coffee Creamer, and "Sugar-free" Products, Lemonade. This will help patient to have more stable blood glucose profile and potentially avoid unintended weight gain.  - Patient is advised to stick to a routine mealtimes to eat 3 meals  a day and avoid unnecessary snacks ( to snack only to correct hypoglycemia).  - I have approached patient with the following individualized plan to manage diabetes and patient agrees.  He is urged for  better engagement.  He failed to utilize his recommended treatment options.  He is still not utilizing his CGM adequately, viewing only 0-1 time daily on average.  He did not comply with basal/bolus insulin previously tried.  He was switched to premixed insulin for simplicity reasons.      He is approached again for proper engagement for basic utility of his CGM device and his premixed insulin.  He is advised to resume and continue  his Humalog 75/25  50 units with breakfast and 50 units with supper   when Premeal blood glucose readings are above 90 mg per DL.    -He is advised to continue monitoring blood glucose continuously, at least 4 times a day.    -He is warned not to inject insulin without proper monitoring of blood glucose.  - He is not the right candidate for SGLT2 inhibitors nor incretin therapy.    -Target numbers for A1c, LDL, HDL, Triglycerides,  were discussed in detail.   2) BP/HTN:  -His blood pressure is controlled to target.  He is advised to continue his current medications including beta blockers.  He has marginal blood pressure, he will not tolerate additional ACE inhibitor's.     3) Lipids/HPL: His lipid panel shows worsening profile once again, LDL at 103 from 87.  He is advised to continue Crestor 20 mg p.o. nightly.        Side effects and precautions discussed with him.   4)  Weight/Diet: His BMI is 25.19--  He is not a candidate for weight loss.  He is  following with  CDE consult, exercise, and carbohydrates information provided.    5) Chronic Care/Health Maintenance:  -Patient is  Statin medications and encouraged to continue to follow up with Ophthalmology, Podiatrist at least yearly or according to recommendations, and advised to  quit Smoking (tragically he resumed smoking after he quit briefly during his diagnosis of CVA ). I have recommended yearly flu vaccine  and pneumonia vaccination at least every 5 years; moderate intensity exercise for up to 150  minutes weekly; and  sleep for at least 7 hours a day.  The patient was counseled on the dangers of tobacco use, and was advised to quit.  Reviewed strategies to maximize success, including removing cigarettes and smoking materials from environment.   He recently had normal ABI, will be repeated in November 2026,  or sooner if needed. I advised patient to maintain close follow up with his PCP for primary care needs.   I spent 32 minutes in the care of the patient today including review of labs from [place], Lipids, Thyroid Function, Hematology (current and previous including abstractions from other facilities); face-to-face time discussing  his blood glucose readings/logs, discussing hypoglycemia and hyperglycemia episodes and symptoms, medications doses, his options of short and long term treatment based on the latest standards of care / guidelines;  discussion about incorporating lifestyle medicine;  and documenting the encounter. Risk reduction counseling performed per USPSTF guidelines to reduce  cardiovascular risk factors.     Please refer to Patient Instructions for Blood Glucose Monitoring and Insulin/Medications Dosing Guide"  in media tab for additional information. Please  also refer to " Patient Self Inventory" in the Media  tab for reviewed elements of pertinent patient history.  Nicholas [name] participated in the discussions, expressed understanding, and voiced agreement with the above plans.  All questions were answered to his satisfaction. he is encouraged to contact clinic should he have any questions or concerns prior to his return visit.     Follow up plan: Return in about 4 weeks (around 01/01/2022) for F/U with Pre-visit Labs, Meter/CGM/Logs, A1c here.  Glade Lloyd, MD Phone: [phone]  Fax: [phone]  This note was partially dictated with voice recognition software. Similar sounding words can be transcribed inadequately or may not  be corrected upon review.  12/04/2021,  11:28 AM

## 2021-12-04 NOTE — Patient Instructions (Signed)
                                     Advice for Weight Management  -For most of us the best way to lose weight is by diet management. Generally speaking, diet management means consuming less calories intentionally which over time brings about progressive weight loss.  This can be achieved more effectively by avoiding ultra processed carbohydrates, processed meats, unhealthy fats.    It is critically important to know your numbers: how much calorie you are consuming and how much calorie you need. More importantly, our carbohydrates sources should be unprocessed naturally occurring  complex starch food items.  It is always important to balance nutrition also by  appropriate intake of proteins (mainly plant-based), healthy fats/oils, plenty of fruits and vegetables.   -The American College of Lifestyle Medicine (ACL M) recommends nutrition derived mostly from Whole Food, Plant Predominant Sources example an apple instead of applesauce or apple pie. Eat Plenty of vegetables, Mushrooms, fruits, Legumes, Whole Grains, Nuts, seeds in lieu of processed meats, processed snacks/pastries red meat, poultry, eggs.  Use only water or unsweetened tea for hydration.  The College also recommends the need to stay away from risky substances including alcohol, smoking; obtaining 7-9 hours of restorative sleep, at least 150 minutes of moderate intensity exercise weekly, importance of healthy social connections, and being mindful of stress and seek help when it is overwhelming.    -Sticking to a routine mealtime to eat 3 meals a day and avoiding unnecessary snacks is shown to have a big role in weight control. Under normal circumstances, the only time we burn stored energy is when we are hungry, so allow  some hunger to take place- hunger means no food between appropriate meal times, only water.  It is not advisable to starve.   -It is better to avoid simple carbohydrates including:  Cakes, Sweet Desserts, Ice Cream, Soda (diet and regular), Sweet Tea, Candies, Chips, Cookies, Store Bought Juices, Alcohol in Excess of  1-2 drinks a day, Lemonade,  Artificial Sweeteners, Doughnuts, Coffee Creamers, "Sugar-free" Products, etc, etc.  This is not a complete list.....    -Consulting with certified diabetes educators is proven to provide you with the most accurate and current information on diet.  Also, you may be  interested in discussing diet options/exchanges , we can schedule a visit with Nicholas [name], RDN, CDE for individualized nutrition education.  -Exercise: If you are able: 30 -60 minutes a day ,4 days a week, or 150 minutes of moderate intensity exercise weekly.    The longer the better if tolerated.  Combine stretch, strength, and aerobic activities.  If you were told in the past that you have high risk for cardiovascular diseases, or if you are currently symptomatic, you may seek evaluation by your heart doctor prior to initiating moderate to intense exercise programs.                                  Additional Care Considerations for Diabetes/Prediabetes   -Diabetes  is a chronic disease.  The most important care consideration is regular follow-up with your diabetes care provider with the goal being avoiding or delaying its complications and to take advantage of advances in medications and technology.  If appropriate actions are taken early enough, type 2 diabetes can even be   reversed.  Seek information from the right source.  - Whole Food, Plant Predominant Nutrition is highly recommended: Eat Plenty of vegetables, Mushrooms, fruits, Legumes, Whole Grains, Nuts, seeds in lieu of processed meats, processed snacks/pastries red meat, poultry, eggs as recommended by American College of  Lifestyle Medicine (ACLM).  -Type 2 diabetes is known to coexist with other important comorbidities such as high blood pressure and high cholesterol.  It is critical to control not only the  diabetes but also the high blood pressure and high cholesterol to minimize and delay the risk of complications including coronary artery disease, stroke, amputations, blindness, etc.  The good news is that this diet recommendation for type 2 diabetes is also very helpful for managing high cholesterol and high blood blood pressure.  - Studies showed that people with diabetes will benefit from a class of medications known as ACE inhibitors and statins.  Unless there are specific reasons not to be on these medications, the standard of care is to consider getting one from these groups of medications at an optimal doses.  These medications are generally considered safe and proven to help protect the heart and the kidneys.    - People with diabetes are encouraged to initiate and maintain regular follow-up with eye doctors, foot doctors, dentists , and if necessary heart and kidney doctors.     - It is highly recommended that people with diabetes quit smoking or stay away from smoking, and get yearly  flu vaccine and pneumonia vaccine at least every 5 years.  See above for additional recommendations on exercise, sleep, stress management , and healthy social connections.      

## 2021-12-26 ENCOUNTER — Other Ambulatory Visit: Payer: Self-pay | Admitting: "Endocrinology

## 2021-12-30 DIAGNOSIS — E1159 Type 2 diabetes mellitus with other circulatory complications: Secondary | ICD-10-CM | POA: Diagnosis not present

## 2021-12-31 LAB — COMPREHENSIVE METABOLIC PANEL
ALT: 23 IU/L (ref 0–44)
AST: 17 IU/L (ref 0–40)
Albumin/Globulin Ratio: 1.6 (ref 1.2–2.2)
Albumin: 4 g/dL (ref 3.9–4.9)
Alkaline Phosphatase: 85 IU/L (ref 44–121)
BUN/Creatinine Ratio: 21 (ref 10–24)
BUN: 18 mg/dL (ref 8–27)
Bilirubin Total: 0.2 mg/dL (ref 0.0–1.2)
CO2: 25 mmol/L (ref 20–29)
Calcium: 9.6 mg/dL (ref 8.6–10.2)
Chloride: 103 mmol/L (ref 96–106)
Creatinine, Ser: 0.86 mg/dL (ref 0.76–1.27)
Globulin, Total: 2.5 g/dL (ref 1.5–4.5)
Glucose: 151 mg/dL — ABNORMAL HIGH (ref 70–99)
Potassium: 4.9 mmol/L (ref 3.5–5.2)
Sodium: 140 mmol/L (ref 134–144)
Total Protein: 6.5 g/dL (ref 6.0–8.5)
eGFR: 98 mL/min/{1.73_m2} (ref >59)

## 2021-12-31 LAB — T4, FREE: Free T4: 1.06 ng/dL (ref 0.82–1.77)

## 2021-12-31 LAB — LIPID PANEL
Chol/HDL Ratio: 4.4 ratio (ref 0.0–5.0)
Cholesterol, Total: 133 mg/dL (ref 100–199)
HDL: 30 mg/dL — ABNORMAL LOW (ref >39)
LDL Chol Calc (NIH): 64 mg/dL (ref 0–99)
Triglycerides: 236 mg/dL — ABNORMAL HIGH (ref 0–149)
VLDL Cholesterol Cal: 39 mg/dL (ref 5–40)

## 2021-12-31 LAB — TSH: TSH: 2.78 u[IU]/mL (ref 0.450–4.500)

## 2022-01-01 ENCOUNTER — Ambulatory Visit: Payer: BC Managed Care – PPO | Admitting: "Endocrinology

## 2022-01-01 ENCOUNTER — Encounter: Payer: Self-pay | Admitting: "Endocrinology

## 2022-01-01 VITALS — BP 112/76 | HR 108 | Ht 69.0 in | Wt 173.0 lb

## 2022-01-01 DIAGNOSIS — E782 Mixed hyperlipidemia: Secondary | ICD-10-CM | POA: Diagnosis not present

## 2022-01-01 DIAGNOSIS — I10 Essential (primary) hypertension: Secondary | ICD-10-CM

## 2022-01-01 DIAGNOSIS — E559 Vitamin D deficiency, unspecified: Secondary | ICD-10-CM

## 2022-01-01 DIAGNOSIS — E1159 Type 2 diabetes mellitus with other circulatory complications: Secondary | ICD-10-CM | POA: Diagnosis not present

## 2022-01-01 MED ORDER — INSULIN LISPRO PROT & LISPRO (75-25 MIX) 100 UNIT/ML KWIKPEN
60.0000 [IU] | PEN_INJECTOR | Freq: Two times a day (BID) | SUBCUTANEOUS | 2 refills | Status: DC
Start: 2022-01-01 — End: 2022-02-21

## 2022-01-01 NOTE — Patient Instructions (Signed)
                                     Advice for Weight Management  -For most of us the best way to lose weight is by diet management. Generally speaking, diet management means consuming less calories intentionally which over time brings about progressive weight loss.  This can be achieved more effectively by avoiding ultra processed carbohydrates, processed meats, unhealthy fats.    It is critically important to know your numbers: how much calorie you are consuming and how much calorie you need. More importantly, our carbohydrates sources should be unprocessed naturally occurring  complex starch food items.  It is always important to balance nutrition also by  appropriate intake of proteins (mainly plant-based), healthy fats/oils, plenty of fruits and vegetables.   -The American College of Lifestyle Medicine (ACL M) recommends nutrition derived mostly from Whole Food, Plant Predominant Sources example an apple instead of applesauce or apple pie. Eat Plenty of vegetables, Mushrooms, fruits, Legumes, Whole Grains, Nuts, seeds in lieu of processed meats, processed snacks/pastries red meat, poultry, eggs.  Use only water or unsweetened tea for hydration.  The College also recommends the need to stay away from risky substances including alcohol, smoking; obtaining 7-9 hours of restorative sleep, at least 150 minutes of moderate intensity exercise weekly, importance of healthy social connections, and being mindful of stress and seek help when it is overwhelming.    -Sticking to a routine mealtime to eat 3 meals a day and avoiding unnecessary snacks is shown to have a big role in weight control. Under normal circumstances, the only time we burn stored energy is when we are hungry, so allow  some hunger to take place- hunger means no food between appropriate meal times, only water.  It is not advisable to starve.   -It is better to avoid simple carbohydrates including:  Cakes, Sweet Desserts, Ice Cream, Soda (diet and regular), Sweet Tea, Candies, Chips, Cookies, Store Bought Juices, Alcohol in Excess of  1-2 drinks a day, Lemonade,  Artificial Sweeteners, Doughnuts, Coffee Creamers, "Sugar-free" Products, etc, etc.  This is not a complete list.....    -Consulting with certified diabetes educators is proven to provide you with the most accurate and current information on diet.  Also, you may be  interested in discussing diet options/exchanges , we can schedule a visit with Nicholas [name], RDN, CDE for individualized nutrition education.  -Exercise: If you are able: 30 -60 minutes a day ,4 days a week, or 150 minutes of moderate intensity exercise weekly.    The longer the better if tolerated.  Combine stretch, strength, and aerobic activities.  If you were told in the past that you have high risk for cardiovascular diseases, or if you are currently symptomatic, you may seek evaluation by your heart doctor prior to initiating moderate to intense exercise programs.                                  Additional Care Considerations for Diabetes/Prediabetes   -Diabetes  is a chronic disease.  The most important care consideration is regular follow-up with your diabetes care provider with the goal being avoiding or delaying its complications and to take advantage of advances in medications and technology.  If appropriate actions are taken early enough, type 2 diabetes can even be   reversed.  Seek information from the right source.  - Whole Food, Plant Predominant Nutrition is highly recommended: Eat Plenty of vegetables, Mushrooms, fruits, Legumes, Whole Grains, Nuts, seeds in lieu of processed meats, processed snacks/pastries red meat, poultry, eggs as recommended by American College of  Lifestyle Medicine (ACLM).  -Type 2 diabetes is known to coexist with other important comorbidities such as high blood pressure and high cholesterol.  It is critical to control not only the  diabetes but also the high blood pressure and high cholesterol to minimize and delay the risk of complications including coronary artery disease, stroke, amputations, blindness, etc.  The good news is that this diet recommendation for type 2 diabetes is also very helpful for managing high cholesterol and high blood blood pressure.  - Studies showed that people with diabetes will benefit from a class of medications known as ACE inhibitors and statins.  Unless there are specific reasons not to be on these medications, the standard of care is to consider getting one from these groups of medications at an optimal doses.  These medications are generally considered safe and proven to help protect the heart and the kidneys.    - People with diabetes are encouraged to initiate and maintain regular follow-up with eye doctors, foot doctors, dentists , and if necessary heart and kidney doctors.     - It is highly recommended that people with diabetes quit smoking or stay away from smoking, and get yearly  flu vaccine and pneumonia vaccine at least every 5 years.  See above for additional recommendations on exercise, sleep, stress management , and healthy social connections.      

## 2022-01-01 NOTE — Progress Notes (Signed)
01/01/2022  Endocrinology follow-up note  Subjective:    Patient ID: Nicholas [name], male    DOB: 1960/04/04,    Past Medical History:  Diagnosis Date   Anal lesion    Anal papilla/tags - external lesion   Bilateral carpal tunnel syndrome 11/24/2018   Colon polyp    Hyperplastic rectosigmoid polyp 9/08   Coronary atherosclerosis of native coronary artery    DES to PLB, BMS to mid and distal RCA, DES to circumflex and distal LAD, 6/10 (5 stents)   Diabetic peripheral neuropathy ([place]) 11/24/2018   GERD (gastroesophageal reflux disease)    Helicobacter pylori (H. pylori) 09/2009   Treated with helidac   Hiatal hernia    Hyperplastic colon polyp 2008   Mixed hyperlipidemia    Myocardial infarction ([place]) 06/2008   Seizures ([place])    Stroke [facility])    January 2018   Type 2 diabetes mellitus [facility])    Past Surgical History:  Procedure Laterality Date   CHOLECYSTECTOMY     COLLAPSED LUNG     15 years ago   COLONOSCOPY  10/12/2006   Dr. Gala Romney- rectosigmoid polyp s/phot snare polypectomy o/w normal rectum and terminal ileum. hyperplastic polyp on bx   COLONOSCOPY WITH PROPOFOL N/A 09/01/2019   Procedure: COLONOSCOPY WITH PROPOFOL;  Surgeon: Daneil Dolin, MD;  [facility];  Service: Endoscopy;  Laterality: N/A;  8:30am   EP IMPLANTABLE DEVICE N/A 02/12/2016   Procedure: Loop Recorder Insertion;  Surgeon: Thompson Grayer, MD;  Location: [place] CV LAB;  Service: Cardiovascular;  Laterality: N/A;   ESOPHAGOGASTRODUODENOSCOPY  10/12/2006   Dr. Gala Romney- examination of the tubular esophagus revealed no mucosal abnormalities. the EG junction was easily traversed. small hiatal hernia, the gastric mucosa o/w appeared normal. there was no infiltrating process or frank ulcer seen.   implantable loop recorder removal  09/29/2019   MDT Reveal LINQ removed in office by Dr Rayann Heman   TEE WITHOUT CARDIOVERSION N/A 02/12/2016   Procedure: TRANSESOPHAGEAL ECHOCARDIOGRAM (TEE);  Surgeon: Jerline Pain, MD;  Location: [facility] ENDOSCOPY;  Service: Cardiovascular;  Laterality: N/A;   WIRE IN APEX OF RIGHT LUNG     Since childhood   Social History   Socioeconomic History   Marital status: Married    Spouse name: Not on file   Number of children: Not on file   Years of education: Not on file   Highest education level: Not on file  Occupational History   Occupation: maintenance, parking lots    Employer: SELF-EMPLOYED  Tobacco Use   Smoking status: Every Day    Packs/day: 0.50    Years: 34.00    Total pack years: 17.00    Types: Cigarettes    Start date: 04/08/1973   Smokeless tobacco: Never  Vaping Use   Vaping Use: Never used  Substance and Sexual Activity   Alcohol use: No    Alcohol/week: 0.0 standard drinks of alcohol   Drug use: No   Sexual activity: Yes    Partners: Female    Birth control/protection: Pill  Other Topics Concern   Not on file  Social History Narrative   Drinks Coffee 2 cups daily.  Lives sat home with wife.   Is self employed.  Graduated from High school.     Social Determinants of Health   Financial Resource Strain: Not on file  Food Insecurity: Not on file  Transportation Needs: Not on file  Physical Activity: Not on file  Stress: Not on file  Social Connections: Not on file   Outpatient Encounter Medications as of 01/01/2022  Medication Sig   acetaminophen (TYLENOL) 500 MG tablet Take 500 mg by mouth every 6 (six) hours as needed (for headaches).   albuterol (VENTOLIN HFA) 108 (90 Base) MCG/ACT inhaler Inhale 1-2 puffs into the lungs every 6 (six) hours as needed for wheezing or shortness of breath.   aspirin EC 81 MG EC tablet Take 1 tablet (81 mg total) by mouth daily. Swallow whole.   BD PEN NEEDLE NANO U/F 32G X 4 MM MISC USE AS DIRECTED FOUR TIMES DAILY.   Continuous Blood Gluc Receiver (FREESTYLE LIBRE 2 READER) DEVI As directed   Continuous Blood Gluc Receiver (FREESTYLE LIBRE 2 READER) DEVI As directed   Continuous Blood Gluc Sensor  (FREESTYLE LIBRE 2 SENSOR) MISC APPLY 1 PATCH TO SKIN ONCE EVERY 14 DAYS   gabapentin (NEURONTIN) 100 MG capsule Take 100 mg by mouth daily.    Insulin Lispro Prot & Lispro (HUMALOG MIX 75/25 KWIKPEN) (75-25) 100 UNIT/ML Kwikpen Inject 60 Units into the skin 2 (two) times daily with a meal.   nitroGLYCERIN (NITROSTAT) 0.4 MG SL tablet DISSOLVE ONE TABLET UNDER THE TONGUE EVERY 5 MINUTES AS NEEDED FOR CHEST PAIN.  DO NOT EXCEED A TOTAL OF 3 DOSES IN 15 MINUTES   nortriptyline (PAMELOR) 10 MG capsule Take 4 capsules (40 mg total) by mouth at bedtime.   pantoprazole (PROTONIX) 40 MG tablet Take 40 mg by mouth daily before breakfast.   rosuvastatin (CRESTOR) 20 MG tablet Take 1 tablet (20 mg total) by mouth daily. (Patient taking differently: Take 20 mg by mouth every evening.)   traMADol (ULTRAM) 50 MG tablet Take 1 tablet (50 mg total) by mouth every 6 (six) hours as needed for moderate pain. (Patient taking differently: Take 50 mg by mouth daily.)   Ubrogepant (UBRELVY) 100 MG TABS Take 100 mg by mouth daily as needed. Take one tablet at onset of headache, may repeat 1 tablet in 2 hours, no more than 2 tablets in 24 hours   [DISCONTINUED] Insulin Lispro Prot & Lispro (HUMALOG MIX 75/25 KWIKPEN) (75-25) 100 UNIT/ML Kwikpen Inject 50 Units into the skin 2 (two) times daily with a meal.   No facility-administered encounter medications on file as of 01/01/2022.   ALLERGIES: Allergies  Allergen Reactions   Keppra [Levetiracetam]     Hallucination   Topamax [Topiramate]     irritable   VACCINATION STATUS:  There is no immunization history on file for this patient.  Diabetes He presents for his follow-up diabetic visit. He has type 2 diabetes mellitus. Onset time: He was diagnosed at approximate age of 42 years. In this particular patient with a history of heavy alcohol use possiblity of pancreatic diabetes is high. His disease course has been worsening. Pertinent negatives for hypoglycemia include  no confusion, headaches, pallor, seizures or speech difficulty. Pertinent negatives for diabetes include no chest pain, no fatigue, no polydipsia, no polyphagia, no polyuria and no weakness. There are no hypoglycemic complications. Symptoms are improving. Diabetic complications include a CVA and heart disease. Risk factors for coronary artery disease include diabetes mellitus, dyslipidemia, hypertension, male sex, sedentary lifestyle and tobacco exposure. Current diabetic treatment includes oral agent (dual therapy). He is compliant with treatment some of the time. His weight is increasing steadily. He is following a generally unhealthy diet. When asked about meal planning, he reported none. He has not had a previous visit with a dietitian (he did not keep appointment.).  He rarely participates in exercise. His home blood glucose trend is decreasing steadily. His breakfast blood glucose range is generally >200 mg/dl. His dinner blood glucose range is generally >200 mg/dl. His bedtime blood glucose range is generally >200 mg/dl. His overall blood glucose range is >200 mg/dl. (Nicholas [name] presents with improvement in his glycemic profile.  His AGP report shows 25% time in range, 37% level 1 hyperglycemia, 38% level 2 hyperglycemia.  This is a significant improvement compared to his last presentation when he had 0% in range, 7%  1 hypoglycemia, 93% level 2 hyperglycemia.  His average blood glucose is 226 improving from 331 for the last 14 days.  He reports better consistency and compliance with current insulin injection opportunities.  His recent point-of-care A1c is 11.2%  increasing from 10.7% during his last visit.  ) Eye exam is current.  Hyperlipidemia This is a chronic problem. The current episode started more than 1 year ago. The problem is uncontrolled (He was convinced to take Crestor during his last visit, returns with significant improvement in his LDL from 135 down to 87, triglycerides from 396-215.).  Exacerbating diseases include diabetes. Pertinent negatives include no chest pain, myalgias or shortness of breath. Current antihyperlipidemic treatment includes statins. Risk factors for coronary artery disease include dyslipidemia, diabetes mellitus, hypertension and a sedentary lifestyle.  Hypertension This is a chronic problem. The current episode started more than 1 year ago. The problem is controlled. Pertinent negatives include no chest pain, headaches, neck pain, palpitations or shortness of breath. Risk factors for coronary artery disease include diabetes mellitus, dyslipidemia and smoking/tobacco exposure. Past treatments include beta blockers. Hypertensive end-organ damage includes CAD/MI and CVA.    Review of Systems  Constitutional:  Negative for fatigue and unexpected weight change.  HENT:  Negative for dental problem, mouth sores and trouble swallowing.   Eyes:  Negative for visual disturbance.  Respiratory:  Negative for cough, choking, chest tightness, shortness of breath and wheezing.   Cardiovascular:  Negative for chest pain, palpitations and leg swelling.  Gastrointestinal:  Negative for abdominal distention, abdominal pain, constipation, diarrhea, nausea and vomiting.  Endocrine: Negative for polydipsia, polyphagia and polyuria.  Genitourinary:  Negative for dysuria, flank pain, hematuria and urgency.  Musculoskeletal:  Negative for back pain, gait problem, myalgias and neck pain.  Skin:  Negative for pallor, rash and wound.  Neurological:  Negative for seizures, syncope, speech difficulty, weakness, numbness and headaches.  Psychiatric/Behavioral:  Negative for confusion and dysphoric mood.     Objective:    BP 112/76   Pulse (!) 108   Ht _0  (1.753 m)   Wt 173 lb (78.5 kg)   BMI 25.55 kg/m   Wt Readings from Last 3 Encounters:  01/01/22 173 lb (78.5 kg)  12/04/21 170 lb 9.6 oz (77.4 kg)  09/16/21 169 lb 8 oz (76.9 kg)      Results for orders placed or  performed in visit on 12/04/21  Comprehensive metabolic panel  Result Value Ref Range   Glucose 151 (H) 70 - 99 mg/dL   BUN 18 8 - 27 mg/dL   Creatinine, Ser 0.86 0.76 - 1.27 mg/dL   eGFR 98 >59 mL/min/1.73   BUN/Creatinine Ratio 21 10 - 24   Sodium 140 134 - 144 mmol/L   Potassium 4.9 3.5 - 5.2 mmol/L   Chloride 103 96 - 106 mmol/L   CO2 25 20 - 29 mmol/L   Calcium 9.6 8.6 - 10.2 mg/dL   Total Protein 6.5  6.0 - 8.5 g/dL   Albumin 4.0 3.9 - 4.9 g/dL   Globulin, Total 2.5 1.5 - 4.5 g/dL   Albumin/Globulin Ratio 1.6 1.2 - 2.2   Bilirubin Total 0.2 0.0 - 1.2 mg/dL   Alkaline Phosphatase 85 44 - 121 IU/L   AST 17 0 - 40 IU/L   ALT 23 0 - 44 IU/L  Lipid panel  Result Value Ref Range   Cholesterol, Total 133 100 - 199 mg/dL   Triglycerides 236 (H) 0 - 149 mg/dL   HDL 30 (L) >39 mg/dL   VLDL Cholesterol Cal 39 5 - 40 mg/dL   LDL Chol Calc (NIH) 64 0 - 99 mg/dL   Chol/HDL Ratio 4.4 0.0 - 5.0 ratio  TSH  Result Value Ref Range   TSH 2.780 0.450 - 4.500 uIU/mL  T4, free  Result Value Ref Range   Free T4 1.06 0.82 - 1.77 ng/dL  HgB A1c  Result Value Ref Range   Hemoglobin A1C     HbA1c POC (<> result, manual entry)     HbA1c, POC (prediabetic range)     HbA1c, POC (controlled diabetic range) 11.2 (A) 0.0 - 7.0 %   Diabetic Labs (most recent): Lab Results  Component Value Date   HGBA1C 11.2 (A) 12/04/2021   HGBA1C 10.7 (A) 07/16/2021   HGBA1C 10.1 (A) 04/15/2021   Lipid Panel     Component Value Date/Time   CHOL 133 12/30/2021 0829   TRIG 236 (H) 12/30/2021 0829   HDL 30 (L) 12/30/2021 0829   CHOLHDL 4.4 12/30/2021 0829   CHOLHDL 7.7 (H) 08/30/2020 0741   VLDL 20 05/11/2020 0224   LDLCALC 64 12/30/2021 0829   LDLCALC 139 (H) 08/30/2020 0741     Assessment & Plan:   1. Type 2 diabetes mellitus with vascular disease (HCC) -His diabetes is  complicated by alarming noncompliance/nonadherence, coronary artery disease and patient remains at a high risk for more acute  and chronic complications of diabetes which include CAD, CVA, CKD, retinopathy, and neuropathy. These are all discussed in detail with the patient.  Nicholas [name] presents with improvement in his glycemic profile.  His AGP report shows 25% time in range, 37% level 1 hyperglycemia, 38% level 2 hyperglycemia.  This is a significant improvement compared to his last presentation when he had 0% in range, 7%  1 hypoglycemia, 93% level 2 hyperglycemia.  His average blood glucose is 226 improving from 331 for the last 14 days.  He reports better consistency and compliance with current insulin injection opportunities.  His recent point-of-care A1c is 11.2%    - Suggestion is made for him to avoid simple carbohydrates  from his diet including Cakes, Sweet Desserts, Ice Cream, Soda (diet and regular), Sweet Tea, Candies, Chips, Cookies, Store Bought Juices, Alcohol in Excess of  1-2 drinks a day, Artificial Sweeteners,  Coffee Creamer, and "Sugar-free" Products, Lemonade. This will help patient to have more stable blood glucose profile and potentially avoid unintended weight gain.  - Patient is advised to stick to a routine mealtimes to eat 3 meals  a day and avoid unnecessary snacks ( to snack only to correct hypoglycemia).  - I have approached patient with the following individualized plan to manage diabetes and patient agrees.  He is urged to stay and improve his engagement.    He did not comply with basal/bolus insulin previously tried.  He was switched to premixed insulin for simplicity reasons.      He is approached  again for proper utility of his CGM device and his premixed insulin.  He is advised to crease Humalog 75/25 to 60 units with breakfast and 60 units with supper  when Premeal blood glucose readings are above 90 mg per DL.    -He is advised to continue monitoring blood glucose continuously, at least 4 times a day.    -He is warned not to inject insulin without proper monitoring of blood  glucose.  - He is not the right candidate for SGLT2 inhibitors nor incretin therapy.    -Target numbers for A1c, LDL, HDL, Triglycerides,  were discussed in detail.   2) BP/HTN:  -His blood pressure is controlled to target.  He is advised to continue his current medications including beta blockers.  He has marginal blood pressure, he will not tolerate additional ACE inhibitor's.     3) Lipids/HPL: His lipid panel shows worsening profile once again, LDL at 103 from 87.  He is advised to continue Crestor 20 mg p.o. nightly.         Side effects and precautions discussed with him.   4)  Weight/Diet: His BMI is 25.55--  He is not a candidate for weight loss.  He is  following with  CDE consult, exercise, and carbohydrates information provided.    5) Chronic Care/Health Maintenance:  -Patient is  Statin medications and encouraged to continue to follow up with Ophthalmology, Podiatrist at least yearly or according to recommendations, and advised to  quit Smoking (tragically he resumed smoking after he quit briefly during his diagnosis of CVA ). I have recommended yearly flu vaccine and pneumonia vaccination at least every 5 years; moderate intensity exercise for up to 150 minutes weekly; and  sleep for at least 7 hours a day.  The patient was counseled on the dangers of tobacco use, and was advised to quit.  Reviewed strategies to maximize success, including removing cigarettes and smoking materials from environment.   He recently had normal ABI, will be repeated in November 2026,  or sooner if needed. I advised patient to maintain close follow up with his PCP for primary care needs.   I spent 26 minutes in the care of the patient today including review of labs from [place], Lipids, Thyroid Function, Hematology (current and previous including abstractions from other facilities); face-to-face time discussing  his blood glucose readings/logs, discussing hypoglycemia and hyperglycemia episodes and  symptoms, medications doses, his options of short and long term treatment based on the latest standards of care / guidelines;  discussion about incorporating lifestyle medicine;  and documenting the encounter. Risk reduction counseling performed per USPSTF guidelines to reduce  cardiovascular risk factors.     Please refer to Patient Instructions for Blood Glucose Monitoring and Insulin/Medications Dosing Guide"  in media tab for additional information. Please  also refer to " Patient Self Inventory" in the Media  tab for reviewed elements of pertinent patient history.  Nicholas [name] participated in the discussions, expressed understanding, and voiced agreement with the above plans.  All questions were answered to his satisfaction. he is encouraged to contact clinic should he have any questions or concerns prior to his return visit.     Follow up plan: Return in about 3 months (around 04/02/2022) for Bring Meter/CGM Device/Logs- A1c in Office.  Glade Lloyd, MD Phone: [phone]  Fax: [phone]  This note was partially dictated with voice recognition software. Similar sounding words can be transcribed inadequately or may not  be corrected upon review.  01/01/2022, 12:45 PM

## 2022-01-05 ENCOUNTER — Other Ambulatory Visit: Payer: Self-pay | Admitting: "Endocrinology

## 2022-01-07 ENCOUNTER — Other Ambulatory Visit: Payer: Self-pay

## 2022-01-07 DIAGNOSIS — E782 Mixed hyperlipidemia: Secondary | ICD-10-CM

## 2022-01-07 MED ORDER — ROSUVASTATIN CALCIUM 20 MG PO TABS: 20.0000 mg | ORAL_TABLET | Freq: Every day | ORAL | 1 refills | Status: DC

## 2022-01-08 ENCOUNTER — Ambulatory Visit: Payer: BC Managed Care – PPO | Admitting: Dermatology

## 2022-01-08 DIAGNOSIS — J069 Acute upper respiratory infection, unspecified: Secondary | ICD-10-CM | POA: Diagnosis not present

## 2022-01-08 DIAGNOSIS — Z6825 Body mass index (BMI) 25.0-25.9, adult: Secondary | ICD-10-CM | POA: Diagnosis not present

## 2022-01-29 ENCOUNTER — Other Ambulatory Visit: Payer: Self-pay

## 2022-01-29 DIAGNOSIS — Z87891 Personal history of nicotine dependence: Secondary | ICD-10-CM

## 2022-01-29 DIAGNOSIS — Z122 Encounter for screening for malignant neoplasm of respiratory organs: Secondary | ICD-10-CM

## 2022-01-29 NOTE — Progress Notes (Signed)
LDCT order placed per protocol. Scan scheduled for 02/12 at [place]

## 2022-02-20 ENCOUNTER — Other Ambulatory Visit: Payer: Self-pay | Admitting: "Endocrinology

## 2022-02-25 NOTE — Progress Notes (Unsigned)
Cardiology Office Note  Date: 02/26/2022   ID: [name], [name] 1960-11-02, MRN [id number]  PCP:  Sharilyn Sites, MD  Cardiologist:  Rozann Lesches, MD Electrophysiologist:  None   Chief Complaint  Patient presents with   Cardiac follow-up    History of Present Illness: Nicholas [name] is a 62 y.o. male last seen in November 2022.  He is here for a routine visit.  Reports no interval angina or nitroglycerin use, needs a refill for fresh bottle.  He does not report any sense of palpitations or syncope.  Still working full-time.  Recent follow-up lab work, LDL was down to 64 in December 2023.  He reports compliance with Lipitor.  Also remains on low-dose aspirin.  I personally viewed his ECG today which shows sinus tachycardia with PVC, incomplete right bundle branch block.  Past Medical History:  Diagnosis Date   Anal lesion    Anal papilla/tags - external lesion   Bilateral carpal tunnel syndrome 11/24/2018   Colon polyp    Hyperplastic rectosigmoid polyp 9/08   Coronary atherosclerosis of native coronary artery    DES to PLB, BMS to mid and distal RCA, DES to circumflex and distal LAD, 6/10 (5 stents)   Diabetic peripheral neuropathy ([place]) 11/24/2018   GERD (gastroesophageal reflux disease)    Helicobacter pylori (H. pylori) 09/2009   Treated with helidac   Hiatal hernia    Hyperplastic colon polyp 2008   Mixed hyperlipidemia    Myocardial infarction ([place]) 06/2008   Seizures ([place])    Stroke [facility]    January 2018   Type 2 diabetes mellitus (HCC)     Current Outpatient Medications  Medication Sig Dispense Refill   acetaminophen (TYLENOL) 500 MG tablet Take 500 mg by mouth every 6 (six) hours as needed (for headaches).     albuterol (VENTOLIN HFA) 108 (90 Base) MCG/ACT inhaler Inhale 1-2 puffs into the lungs every 6 (six) hours as needed for wheezing or shortness of breath.     aspirin EC 81 MG EC tablet Take 1 tablet (81 mg total) by mouth daily. Swallow whole. 30  tablet 11   BD PEN NEEDLE NANO U/F 32G X 4 MM MISC USE AS DIRECTED FOUR TIMES DAILY. 100 each 2   Continuous Blood Gluc Receiver (FREESTYLE LIBRE 2 READER) DEVI As directed 1 each 0   Continuous Blood Gluc Receiver (FREESTYLE LIBRE 2 READER) DEVI As directed 1 each 0   Continuous Blood Gluc Sensor (FREESTYLE LIBRE 2 SENSOR) MISC APPLY 1 PATCH TO SKIN ONCE EVERY 14 DAYS 2 each 3   gabapentin (NEURONTIN) 100 MG capsule Take 100 mg by mouth daily.      Insulin Lispro Prot & Lispro (HUMALOG MIX 75/25 KWIKPEN) (75-25) 100 UNIT/ML Kwikpen Inject 60 Units into the skin 2 (two) times daily with a meal. 108 mL 0   nortriptyline (PAMELOR) 10 MG capsule Take 4 capsules (40 mg total) by mouth at bedtime. 360 capsule 3   pantoprazole (PROTONIX) 40 MG tablet Take 40 mg by mouth daily before breakfast.     rosuvastatin (CRESTOR) 20 MG tablet Take 1 tablet (20 mg total) by mouth daily. 90 tablet 1   traMADol (ULTRAM) 50 MG tablet Take 1 tablet (50 mg total) by mouth every 6 (six) hours as needed for moderate pain. (Patient taking differently: Take 50 mg by mouth daily.) 15 tablet 0   Ubrogepant (UBRELVY) 100 MG TABS Take 100 mg by mouth daily as needed. Take one tablet at  onset of headache, may repeat 1 tablet in 2 hours, no more than 2 tablets in 24 hours 10 tablet 11   nitroGLYCERIN (NITROSTAT) 0.4 MG SL tablet DISSOLVE ONE TABLET UNDER THE TONGUE EVERY 5 MINUTES AS NEEDED FOR CHEST PAIN.  DO NOT EXCEED A TOTAL OF 3 DOSES IN 15 MINUTES 25 tablet 3   No current facility-administered medications for this visit.   Allergies:  Keppra [levetiracetam] and Topamax [topiramate]   ROS: No orthopnea or PND.  Physical Exam: VS:  BP (!) 138/98   Pulse 95   Ht 5\' 9"  (1.753 m)   Wt 171 lb (77.6 kg)   BMI 25.25 kg/m , BMI Body mass index is 25.25 kg/m.  Wt Readings from Last 3 Encounters:  02/26/22 171 lb (77.6 kg)  01/01/22 173 lb (78.5 kg)  12/04/21 170 lb 9.6 oz (77.4 kg)    General: Patient appears  comfortable at rest. HEENT: Conjunctiva and lids normal. Neck: Supple, no elevated JVP or carotid bruits. Lungs: Clear to auscultation, nonlabored breathing at rest. Cardiac: Regular rate and rhythm, no S3 or significant systolic murmur. Extremities: No pitting edema.  ECG:  An ECG dated 07/24/2020 was personally reviewed today and demonstrated:  Sinus tachycardia with R' in lead V1, rule out old inferior infarct pattern.  Recent Labwork: 12/30/2021: ALT 23; AST 17; BUN 18; Creatinine, Ser 0.86; Potassium 4.9; Sodium 140; TSH 2.780     Component Value Date/Time   CHOL 133 12/30/2021 0829   TRIG 236 (H) 12/30/2021 0829   HDL 30 (L) 12/30/2021 0829   CHOLHDL 4.4 12/30/2021 0829   CHOLHDL 7.7 (H) 08/30/2020 0741   VLDL 20 05/11/2020 0224   LDLCALC 64 12/30/2021 0829   LDLCALC 139 (H) 08/30/2020 0741    Other Studies Reviewed Today:  Echocardiogram 03/04/2018:  1. The left ventricle has normal systolic function of 21-30%. The cavity  size was normal. There is no increased left ventricular wall thickness.  Echo evidence of normal diastolic relaxation.   2. No evidence of left ventricular regional wall motion abnormalities.   3. The right ventricle has normal systolic function. The cavity was  normal. There is no increase in right ventricular wall thickness. Right  ventricular systolic pressure could not be assessed.   4. The aortic valve is tricuspid. There is moderate calcification of the  aortic valve. There is mild to moderate aortic annular calcification  noted.   5. The mitral valve is normal in structure. There is mild calcification.   6. The tricuspid valve is normal in structure.   7. The aortic root is normal in size and structure.   Assessment and Plan:  1.  CAD status post DES to the PLB, BMS to the mid to distal RCA, and DES to the circumflex and distal LAD in 2010.  He remains asymptomatic without angina or nitroglycerin use, refill provided for fresh bottle.  Continue  aspirin and Crestor otherwise.  2.  Mixed hyperlipidemia, tolerating Crestor with significant improvement in LDL most recently down to 64.  3.  Relatively fast resting heart rate, likely associated with autonomic dysfunction in the setting of type 2 diabetes mellitus.  He is asymptomatic at this time.  Continue to track, may ultimately consider addition of beta-blocker depending on blood pressure trend.  Medication Adjustments/Labs and Tests Ordered: Current medicines are reviewed at length with the patient today.  Concerns regarding medicines are outlined above.   Tests Ordered: Orders Placed This Encounter  Procedures   EKG 12-Lead  Medication Changes: Meds ordered this encounter  Medications   nitroGLYCERIN (NITROSTAT) 0.4 MG SL tablet    Sig: DISSOLVE ONE TABLET UNDER THE TONGUE EVERY 5 MINUTES AS NEEDED FOR CHEST PAIN.  DO NOT EXCEED A TOTAL OF 3 DOSES IN 15 MINUTES    Dispense:  25 tablet    Refill:  3    Disposition:  Follow up  6 months.  Signed, Satira Sark, MD, [facility] 02/26/2022 8:46 AM    [place] [facility] [place]. [address], [place], [place] [address] Phone: [phone]; Fax: [phone]

## 2022-02-26 ENCOUNTER — Ambulatory Visit: Payer: BC Managed Care – PPO | Attending: Cardiology | Admitting: Cardiology

## 2022-02-26 ENCOUNTER — Encounter: Payer: Self-pay | Admitting: Cardiology

## 2022-02-26 VITALS — BP 138/98 | HR 95 | Ht 69.0 in | Wt 171.0 lb

## 2022-02-26 DIAGNOSIS — E782 Mixed hyperlipidemia: Secondary | ICD-10-CM

## 2022-02-26 DIAGNOSIS — I25119 Atherosclerotic heart disease of native coronary artery with unspecified angina pectoris: Secondary | ICD-10-CM | POA: Diagnosis not present

## 2022-02-26 MED ORDER — NITROGLYCERIN 0.4 MG SL SUBL: SUBLINGUAL_TABLET | SUBLINGUAL | 3 refills | Status: DC

## 2022-02-26 NOTE — Patient Instructions (Signed)
Medication Instructions:  Your physician recommends that you continue on your current medications as directed. Please refer to the Current Medication list given to you today.   Labwork: None  Testing/Procedures: None  Follow-Up: Follow up with Dr. McDowell in 6 months.   Any Other Special Instructions Will Be Listed Below (If Applicable).     If you need a refill on your cardiac medications before your next appointment, please call your pharmacy.  

## 2022-03-10 ENCOUNTER — Ambulatory Visit (HOSPITAL_COMMUNITY): Payer: BC Managed Care – PPO

## 2022-03-10 ENCOUNTER — Ambulatory Visit (HOSPITAL_COMMUNITY)
Admission: RE | Admit: 2022-03-10 | Discharge: 2022-03-10 | Disposition: A | Discharge status: Home / Self Care | Payer: BC Managed Care – PPO | Source: Ambulatory Visit | Attending: Physician Assistant | Admitting: Physician Assistant

## 2022-03-10 DIAGNOSIS — F17210 Nicotine dependence, cigarettes, uncomplicated: Secondary | ICD-10-CM | POA: Diagnosis not present

## 2022-03-10 DIAGNOSIS — Z87891 Personal history of nicotine dependence: Secondary | ICD-10-CM

## 2022-03-10 DIAGNOSIS — Z122 Encounter for screening for malignant neoplasm of respiratory organs: Secondary | ICD-10-CM | POA: Insufficient documentation

## 2022-03-11 NOTE — Progress Notes (Signed)
Patient notified of LDCT Lung Cancer Screening Results via mail with the recommendation to follow-up in 12 months. Patient's referring provider has been sent a copy of results. Results are as follows:  IMPRESSION: 1. Lung-RADS 2, benign appearance or behavior. Continue annual screening with low-dose chest CT without contrast in 12 months. 2. New mild ground-glass opacity of the right lower lobe, likely due to infection or aspiration. Recommend attention on follow-up. 3. Severe left main and three-vessel coronary artery calcifications. 4. Aortic Atherosclerosis (ICD10-I70.0) and Emphysema (ICD10-J43.9).

## 2022-04-02 ENCOUNTER — Ambulatory Visit (INDEPENDENT_AMBULATORY_CARE_PROVIDER_SITE_OTHER): Payer: BC Managed Care – PPO | Admitting: "Endocrinology

## 2022-04-02 ENCOUNTER — Encounter: Payer: Self-pay | Admitting: "Endocrinology

## 2022-04-02 VITALS — BP 120/74 | HR 112 | Ht 69.0 in | Wt 172.0 lb

## 2022-04-02 DIAGNOSIS — F17200 Nicotine dependence, unspecified, uncomplicated: Secondary | ICD-10-CM

## 2022-04-02 DIAGNOSIS — E782 Mixed hyperlipidemia: Secondary | ICD-10-CM | POA: Diagnosis not present

## 2022-04-02 DIAGNOSIS — E559 Vitamin D deficiency, unspecified: Secondary | ICD-10-CM | POA: Diagnosis not present

## 2022-04-02 DIAGNOSIS — I10 Essential (primary) hypertension: Secondary | ICD-10-CM

## 2022-04-02 DIAGNOSIS — E1159 Type 2 diabetes mellitus with other circulatory complications: Secondary | ICD-10-CM | POA: Diagnosis not present

## 2022-04-02 LAB — POCT GLYCOSYLATED HEMOGLOBIN (HGB A1C): HbA1c, POC (controlled diabetic range): 10.9 % — AB (ref 0.0–7.0)

## 2022-04-02 MED ORDER — INSULIN LISPRO PROT & LISPRO (75-25 MIX) 100 UNIT/ML KWIKPEN: 70.0000 [IU] | PEN_INJECTOR | Freq: Two times a day (BID) | SUBCUTANEOUS | 2 refills | Status: DC

## 2022-04-02 NOTE — Progress Notes (Signed)
04/02/2022  Endocrinology follow-up note  Subjective:    Patient ID: Nicholas [name], male    DOB: 12-18-1960,    Past Medical History:  Diagnosis Date   Anal lesion    Anal papilla/tags - external lesion   Bilateral carpal tunnel syndrome 11/24/2018   Colon polyp    Hyperplastic rectosigmoid polyp 9/08   Coronary atherosclerosis of native coronary artery    DES to PLB, BMS to mid and distal RCA, DES to circumflex and distal LAD, 6/10 (5 stents)   Diabetic peripheral neuropathy ([place]) 11/24/2018   GERD (gastroesophageal reflux disease)    Helicobacter pylori (H. pylori) 09/2009   Treated with helidac   Hiatal hernia    Hyperplastic colon polyp 2008   Mixed hyperlipidemia    Myocardial infarction ([place]) 06/2008   Seizures ([place])    Stroke [facility])    January 2018   Type 2 diabetes mellitus [facility])    Past Surgical History:  Procedure Laterality Date   CHOLECYSTECTOMY     COLLAPSED LUNG     15 years ago   COLONOSCOPY  10/12/2006   Dr. Gala Romney- rectosigmoid polyp s/phot snare polypectomy o/w normal rectum and terminal ileum. hyperplastic polyp on bx   COLONOSCOPY WITH PROPOFOL N/A 09/01/2019   Procedure: COLONOSCOPY WITH PROPOFOL;  Surgeon: Daneil Dolin, MD;  Location[facility];  Service: Endoscopy;  Laterality: N/A;  8:30am   EP IMPLANTABLE DEVICE N/A 02/12/2016   Procedure: Loop Recorder Insertion;  Surgeon: Thompson Grayer, MD;  Location: [place] CV LAB;  Service: Cardiovascular;  Laterality: N/A;   ESOPHAGOGASTRODUODENOSCOPY  10/12/2006   Dr. Gala Romney- examination of the tubular esophagus revealed no mucosal abnormalities. the EG junction was easily traversed. small hiatal hernia, the gastric mucosa o/w appeared normal. there was no infiltrating process or frank ulcer seen.   implantable loop recorder removal  09/29/2019   MDT Reveal LINQ removed in office by Dr Rayann Heman   TEE WITHOUT CARDIOVERSION N/A 02/12/2016   Procedure: TRANSESOPHAGEAL ECHOCARDIOGRAM (TEE);  Surgeon: Jerline Pain, MD;  Location: [facility] ENDOSCOPY;  Service: Cardiovascular;  Laterality: N/A;   WIRE IN APEX OF RIGHT LUNG     Since childhood   Social History   Socioeconomic History   Marital status: Married    Spouse name: Not on file   Number of children: Not on file   Years of education: Not on file   Highest education level: Not on file  Occupational History   Occupation: maintenance, parking lots    Employer: SELF-EMPLOYED  Tobacco Use   Smoking status: Every Day    Packs/day: 0.50    Years: 34.00    Total pack years: 17.00    Types: Cigarettes    Start date: 04/08/1973   Smokeless tobacco: Never  Vaping Use   Vaping Use: Never used  Substance and Sexual Activity   Alcohol use: No    Alcohol/week: 0.0 standard drinks of alcohol   Drug use: No   Sexual activity: Yes    Partners: Female    Birth control/protection: Pill  Other Topics Concern   Not on file  Social History Narrative   Drinks Coffee 2 cups daily.  Lives sat home with wife.   Is self employed.  Graduated from High school.     Social Determinants of Health   Financial Resource Strain: Not on file  Food Insecurity: Not on file  Transportation Needs: Not on file  Physical Activity: Not on file  Stress: Not on file  Social Connections: Not on file   Outpatient Encounter Medications as of 04/02/2022  Medication Sig   acetaminophen (TYLENOL) 500 MG tablet Take 500 mg by mouth every 6 (six) hours as needed (for headaches).   albuterol (VENTOLIN HFA) 108 (90 Base) MCG/ACT inhaler Inhale 1-2 puffs into the lungs every 6 (six) hours as needed for wheezing or shortness of breath.   aspirin EC 81 MG EC tablet Take 1 tablet (81 mg total) by mouth daily. Swallow whole.   BD PEN NEEDLE NANO U/F 32G X 4 MM MISC USE AS DIRECTED FOUR TIMES DAILY.   Continuous Blood Gluc Receiver (FREESTYLE LIBRE 2 READER) DEVI As directed   Continuous Blood Gluc Receiver (FREESTYLE LIBRE 2 READER) DEVI As directed   Continuous Blood Gluc Sensor  (FREESTYLE LIBRE 2 SENSOR) MISC APPLY 1 PATCH TO SKIN ONCE EVERY 14 DAYS   gabapentin (NEURONTIN) 100 MG capsule Take 100 mg by mouth daily.    Insulin Lispro Prot & Lispro (HUMALOG MIX 75/25 KWIKPEN) (75-25) 100 UNIT/ML Kwikpen Inject 70 Units into the skin 2 (two) times daily with a meal.   nitroGLYCERIN (NITROSTAT) 0.4 MG SL tablet DISSOLVE ONE TABLET UNDER THE TONGUE EVERY 5 MINUTES AS NEEDED FOR CHEST PAIN.  DO NOT EXCEED A TOTAL OF 3 DOSES IN 15 MINUTES   nortriptyline (PAMELOR) 10 MG capsule Take 4 capsules (40 mg total) by mouth at bedtime.   pantoprazole (PROTONIX) 40 MG tablet Take 40 mg by mouth daily before breakfast.   rosuvastatin (CRESTOR) 20 MG tablet Take 1 tablet (20 mg total) by mouth daily.   traMADol (ULTRAM) 50 MG tablet Take 1 tablet (50 mg total) by mouth every 6 (six) hours as needed for moderate pain. (Patient taking differently: Take 50 mg by mouth daily.)   Ubrogepant (UBRELVY) 100 MG TABS Take 100 mg by mouth daily as needed. Take one tablet at onset of headache, may repeat 1 tablet in 2 hours, no more than 2 tablets in 24 hours   [DISCONTINUED] Insulin Lispro Prot & Lispro (HUMALOG MIX 75/25 KWIKPEN) (75-25) 100 UNIT/ML Kwikpen Inject 60 Units into the skin 2 (two) times daily with a meal.   No facility-administered encounter medications on file as of 04/02/2022.   ALLERGIES: Allergies  Allergen Reactions   Keppra [Levetiracetam]     Hallucination   Topamax [Topiramate]     irritable   VACCINATION STATUS:  There is no immunization history on file for this patient.  Diabetes He presents for his follow-up diabetic visit. He has type 2 diabetes mellitus. Onset time: He was diagnosed at approximate age of 14 years. In this particular patient with a history of heavy alcohol use possiblity of pancreatic diabetes is high. His disease course has been worsening. Pertinent negatives for hypoglycemia include no confusion, headaches, pallor, seizures or speech difficulty.  Pertinent negatives for diabetes include no chest pain, no fatigue, no polydipsia, no polyphagia, no polyuria and no weakness. There are no hypoglycemic complications. Symptoms are worsening. Diabetic complications include a CVA and heart disease. Risk factors for coronary artery disease include diabetes mellitus, dyslipidemia, hypertension, male sex, sedentary lifestyle and tobacco exposure. Current diabetic treatment includes oral agent (dual therapy). He is compliant with treatment some of the time. His weight is increasing steadily. He is following a generally unhealthy diet. When asked about meal planning, he reported none. He has not had a previous visit with a dietitian (he did not keep appointment.). He rarely participates in exercise. His home blood glucose trend  is fluctuating minimally. His breakfast blood glucose range is generally >200 mg/dl. His lunch blood glucose range is generally >200 mg/dl. His dinner blood glucose range is generally >200 mg/dl. His bedtime blood glucose range is generally >200 mg/dl. His overall blood glucose range is >200 mg/dl. (Mr. Huckle presents with slight improvement in his point-of-care A1c to 10.9%, however still has severely uncontrolled hyperglycemia admittedly due to the fact that he has been skipping some of his injection opportunities.    -His freestyle libre 2 AGP report shows 7% time in range, 13% level 1 hyperglycemia, 80% level 2 hyperglycemia.  He did not document any hypoglycemia.  Average blood glucose over the last 14 days is 302 mg per DL. ) Eye exam is current.  Hyperlipidemia This is a chronic problem. The current episode started more than 1 year ago. The problem is uncontrolled (He was convinced to take Crestor during his last visit, returns with significant improvement in his LDL from 135 down to 87, triglycerides from 396-215.). Exacerbating diseases include diabetes. Pertinent negatives include no chest pain, myalgias or shortness of breath.  Current antihyperlipidemic treatment includes statins. Risk factors for coronary artery disease include dyslipidemia, diabetes mellitus, hypertension and a sedentary lifestyle.  Hypertension This is a chronic problem. The current episode started more than 1 year ago. The problem is controlled. Pertinent negatives include no chest pain, headaches, neck pain, palpitations or shortness of breath. Risk factors for coronary artery disease include diabetes mellitus, dyslipidemia and smoking/tobacco exposure. Past treatments include beta blockers. Hypertensive end-organ damage includes CAD/MI and CVA.    Review of Systems  Constitutional:  Negative for fatigue and unexpected weight change.  HENT:  Negative for dental problem, mouth sores and trouble swallowing.   Eyes:  Negative for visual disturbance.  Respiratory:  Negative for cough, choking, chest tightness, shortness of breath and wheezing.   Cardiovascular:  Negative for chest pain, palpitations and leg swelling.  Gastrointestinal:  Negative for abdominal distention, abdominal pain, constipation, diarrhea, nausea and vomiting.  Endocrine: Negative for polydipsia, polyphagia and polyuria.  Genitourinary:  Negative for dysuria, flank pain, hematuria and urgency.  Musculoskeletal:  Negative for back pain, gait problem, myalgias and neck pain.  Skin:  Negative for pallor, rash and wound.  Neurological:  Negative for seizures, syncope, speech difficulty, weakness, numbness and headaches.  Psychiatric/Behavioral:  Negative for confusion and dysphoric mood.     Objective:    BP 120/74   Pulse (!) 112   Ht '5\' 9"'$  (1.753 m)   Wt 172 lb (78 kg)   BMI 25.40 kg/m   Wt Readings from Last 3 Encounters:  04/02/22 172 lb (78 kg)  02/26/22 171 lb (77.6 kg)  01/01/22 173 lb (78.5 kg)      Results for orders placed or performed in visit on 04/02/22  HgB A1c  Result Value Ref Range   Hemoglobin A1C     HbA1c POC (<> result, manual entry)     HbA1c,  POC (prediabetic range)     HbA1c, POC (controlled diabetic range) 10.9 (A) 0.0 - 7.0 %   Diabetic Labs (most recent): Lab Results  Component Value Date   HGBA1C 10.9 (A) 04/02/2022   HGBA1C 11.2 (A) 12/04/2021   HGBA1C 10.7 (A) 07/16/2021   Lipid Panel     Component Value Date/Time   CHOL 133 12/30/2021 0829   TRIG 236 (H) 12/30/2021 0829   HDL 30 (L) 12/30/2021 0829   CHOLHDL 4.4 12/30/2021 0829   CHOLHDL 7.7 (H)  08/30/2020 0741   VLDL 20 05/11/2020 0224   LDLCALC 64 12/30/2021 0829   LDLCALC 139 (H) 08/30/2020 0741     Assessment & Plan:   1. Type 2 diabetes mellitus with vascular disease (HCC) -His diabetes is  complicated by alarming noncompliance/nonadherence, coronary artery disease and patient remains at a high risk for more acute and chronic complications of diabetes which include CAD, CVA, CKD, retinopathy, and neuropathy. These are all discussed in detail with the patient.  Mr. Golliday presents with slight improvement in his point-of-care A1c to 10.9%, however still has severely uncontrolled hyperglycemia admittedly due to the fact that he has been skipping some of his injection opportunities.    -His freestyle libre 2 AGP report shows 7% time in range, 13% level 1 hyperglycemia, 80% level 2 hyperglycemia.  He did not document any hypoglycemia.  Average blood glucose over the last 14 days is 302 mg per DL.   - Suggestion is made for him to avoid simple carbohydrates  from his diet including Cakes, Sweet Desserts, Ice Cream, Soda (diet and regular), Sweet Tea, Candies, Chips, Cookies, Store Bought Juices, Alcohol in Excess of  1-2 drinks a day, Artificial Sweeteners,  Coffee Creamer, and "Sugar-free" Products, Lemonade. This will help patient to have more stable blood glucose profile and potentially avoid unintended weight gain.  - Patient is advised to stick to a routine mealtimes to eat 3 meals  a day and avoid unnecessary snacks ( to snack only to correct  hypoglycemia).  - I have approached patient with the following individualized plan to manage diabetes and patient agrees.  He is urged to stay and improve his engagement.    He did not comply with basal/bolus insulin previously tried.  He was switched to premixed insulin for simplicity reasons.      He is approached again for proper engagement and utility of his premixed insulin and his CGM device.  He is advised to increase his Humalog 75/25 to 70 units with breakfast and 70 units with supper  when Premeal blood glucose readings are above 90 mg per DL.    -He is advised to continue monitoring blood glucose continuously, at least 4 times a day.    -He is warned not to inject insulin without proper monitoring of blood glucose.  - He is not the right candidate for SGLT2 inhibitors nor incretin therapy.    -Target numbers for A1c, LDL, HDL, Triglycerides,  were discussed in detail.   2) BP/HTN:  -His blood pressure is controlled to target.  He is advised to continue his current medications including beta blockers.  He has marginal blood pressure, he will not tolerate additional ACE inhibitors. The patient was counseled on the dangers of tobacco use, and was advised to quit.  Reviewed strategies to maximize success, including removing cigarettes and smoking materials from environment.    3) Lipids/HPL: His lipid panel shows worsening profile once again, LDL at 103 from 87.  He is advised to continue Crestor 20 mg p.o. nightly.             Side effects and precautions discussed with him.   4)  Weight/Diet: His BMI is 25.4-  He is not a candidate for weight loss.  He is  following with  CDE consult, exercise, and carbohydrates information provided.    5) Chronic Care/Health Maintenance:  -Patient is  Statin medications and encouraged to continue to follow up with Ophthalmology, Podiatrist at least yearly or according to recommendations, and  advised to  quit Smoking (tragically he resumed  smoking after he quit briefly during his diagnosis of CVA ). I have recommended yearly flu vaccine and pneumonia vaccination at least every 5 years; moderate intensity exercise for up to 150 minutes weekly; and  sleep for at least 7 hours a day.  The patient was counseled on the dangers of tobacco use, and was advised to quit.  Reviewed strategies to maximize success, including removing cigarettes and smoking materials from environment.   He recently had normal ABI, will be repeated in November 2026,  or sooner if needed. I advised patient to maintain close follow up with his PCP for primary care needs.   I spent  28  minutes in the care of the patient today including review of labs from [place], Lipids, Thyroid Function, Hematology (current and previous including abstractions from other facilities); face-to-face time discussing  his blood glucose readings/logs, discussing hypoglycemia and hyperglycemia episodes and symptoms, medications doses, his options of short and long term treatment based on the latest standards of care / guidelines;  discussion about incorporating lifestyle medicine;  and documenting the encounter. Risk reduction counseling performed per USPSTF guidelines to reduce  cardiovascular risk factors.     Please refer to Patient Instructions for Blood Glucose Monitoring and Insulin/Medications Dosing Guide"  in media tab for additional information. Please  also refer to " Patient Self Inventory" in the Media  tab for reviewed elements of pertinent patient history.  Nicholas [name] participated in the discussions, expressed understanding, and voiced agreement with the above plans.  All questions were answered to his satisfaction. he is encouraged to contact clinic should he have any questions or concerns prior to his return visit.   Follow up plan: Return in about 3 months (around 07/03/2022) for F/U with Pre-visit Labs, Meter/CGM/Logs, A1c here.  Glade Lloyd, MD Phone: [phone]  Fax:  [phone]  This note was partially dictated with voice recognition software. Similar sounding words can be transcribed inadequately or may not  be corrected upon review.  04/02/2022, 10:37 AM

## 2022-04-02 NOTE — Patient Instructions (Signed)
                                     Advice for Weight Management  -For most of us the best way to lose weight is by diet management. Generally speaking, diet management means consuming less calories intentionally which over time brings about progressive weight loss.  This can be achieved more effectively by avoiding ultra processed carbohydrates, processed meats, unhealthy fats.    It is critically important to know your numbers: how much calorie you are consuming and how much calorie you need. More importantly, our carbohydrates sources should be unprocessed naturally occurring  complex starch food items.  It is always important to balance nutrition also by  appropriate intake of proteins (mainly plant-based), healthy fats/oils, plenty of fruits and vegetables.   -The American College of Lifestyle Medicine (ACL M) recommends nutrition derived mostly from Whole Food, Plant Predominant Sources example an apple instead of applesauce or apple pie. Eat Plenty of vegetables, Mushrooms, fruits, Legumes, Whole Grains, Nuts, seeds in lieu of processed meats, processed snacks/pastries red meat, poultry, eggs.  Use only water or unsweetened tea for hydration.  The College also recommends the need to stay away from risky substances including alcohol, smoking; obtaining 7-9 hours of restorative sleep, at least 150 minutes of moderate intensity exercise weekly, importance of healthy social connections, and being mindful of stress and seek help when it is overwhelming.    -Sticking to a routine mealtime to eat 3 meals a day and avoiding unnecessary snacks is shown to have a big role in weight control. Under normal circumstances, the only time we burn stored energy is when we are hungry, so allow  some hunger to take place- hunger means no food between appropriate meal times, only water.  It is not advisable to starve.   -It is better to avoid simple carbohydrates including:  Cakes, Sweet Desserts, Ice Cream, Soda (diet and regular), Sweet Tea, Candies, Chips, Cookies, Store Bought Juices, Alcohol in Excess of  1-2 drinks a day, Lemonade,  Artificial Sweeteners, Doughnuts, Coffee Creamers, "Sugar-free" Products, etc, etc.  This is not a complete list.....    -Consulting with certified diabetes educators is proven to provide you with the most accurate and current information on diet.  Also, you may be  interested in discussing diet options/exchanges , we can schedule a visit with Nicholas [name], RDN, CDE for individualized nutrition education.  -Exercise: If you are able: 30 -60 minutes a day ,4 days a week, or 150 minutes of moderate intensity exercise weekly.    The longer the better if tolerated.  Combine stretch, strength, and aerobic activities.  If you were told in the past that you have high risk for cardiovascular diseases, or if you are currently symptomatic, you may seek evaluation by your heart doctor prior to initiating moderate to intense exercise programs.                                  Additional Care Considerations for Diabetes/Prediabetes   -Diabetes  is a chronic disease.  The most important care consideration is regular follow-up with your diabetes care provider with the goal being avoiding or delaying its complications and to take advantage of advances in medications and technology.  If appropriate actions are taken early enough, type 2 diabetes can even be   reversed.  Seek information from the right source.  - Whole Food, Plant Predominant Nutrition is highly recommended: Eat Plenty of vegetables, Mushrooms, fruits, Legumes, Whole Grains, Nuts, seeds in lieu of processed meats, processed snacks/pastries red meat, poultry, eggs as recommended by American College of  Lifestyle Medicine (ACLM).  -Type 2 diabetes is known to coexist with other important comorbidities such as high blood pressure and high cholesterol.  It is critical to control not only the  diabetes but also the high blood pressure and high cholesterol to minimize and delay the risk of complications including coronary artery disease, stroke, amputations, blindness, etc.  The good news is that this diet recommendation for type 2 diabetes is also very helpful for managing high cholesterol and high blood blood pressure.  - Studies showed that people with diabetes will benefit from a class of medications known as ACE inhibitors and statins.  Unless there are specific reasons not to be on these medications, the standard of care is to consider getting one from these groups of medications at an optimal doses.  These medications are generally considered safe and proven to help protect the heart and the kidneys.    - People with diabetes are encouraged to initiate and maintain regular follow-up with eye doctors, foot doctors, dentists , and if necessary heart and kidney doctors.     - It is highly recommended that people with diabetes quit smoking or stay away from smoking, and get yearly  flu vaccine and pneumonia vaccine at least every 5 years.  See above for additional recommendations on exercise, sleep, stress management , and healthy social connections.      

## 2022-04-10 IMAGING — CT CT CHEST LUNG CANCER SCREENING LOW DOSE W/O CM
1 series · 10 of 10 positions shown, 13 images · non-contrast
Comparison: 05/02/2019

CLINICAL DATA: 60-year-old male with 130 pack-year history of
smoking. Lung cancer screening.

EXAM:
CT CHEST WITHOUT CONTRAST LOW-DOSE FOR LUNG CANCER SCREENING
TECHNIQUE: Multidetector CT imaging of the chest was performed following the
standard protocol without IV contrast.

[ct lung segmentation data · axial · 0.67mm/px · z∈[+1288,+1288]mm · 10 of 313 frames shown]
[frame 1/313  mediastinal]
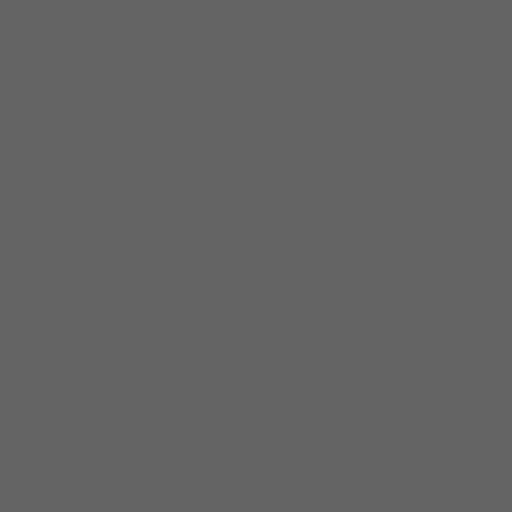
[frame 1/313  lung]
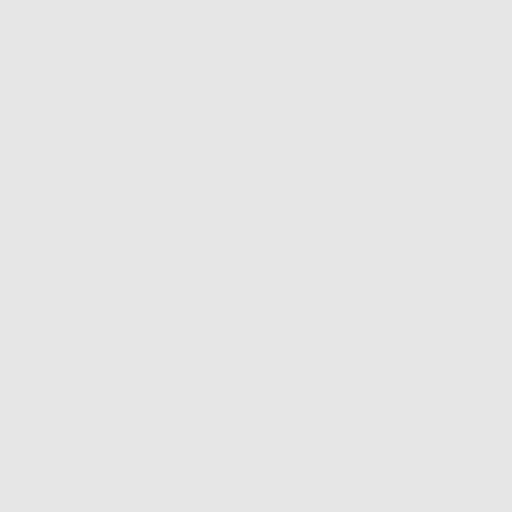
[frame 35/313  lung]
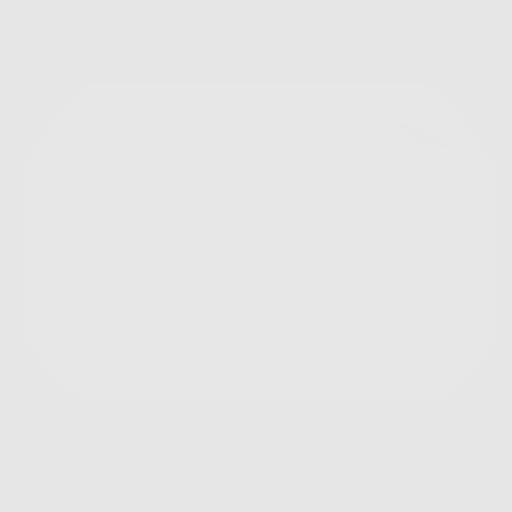
[frame 70/313  lung]
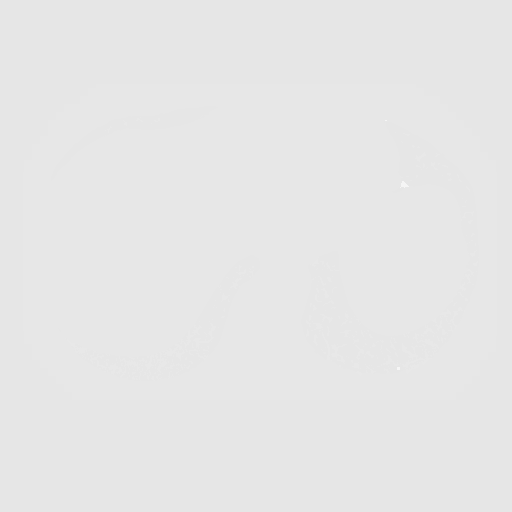
[frame 105/313  lung]
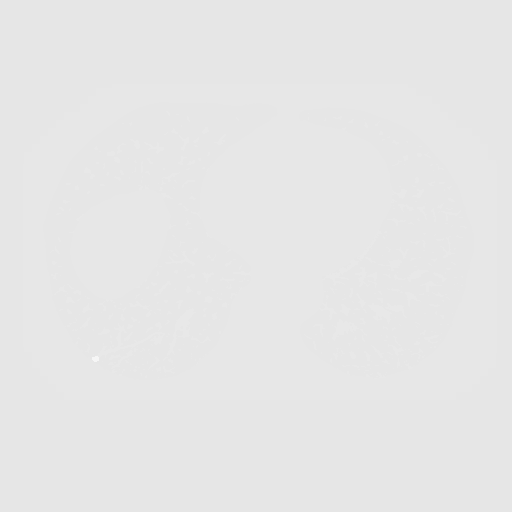
[frame 139/313  mediastinal]
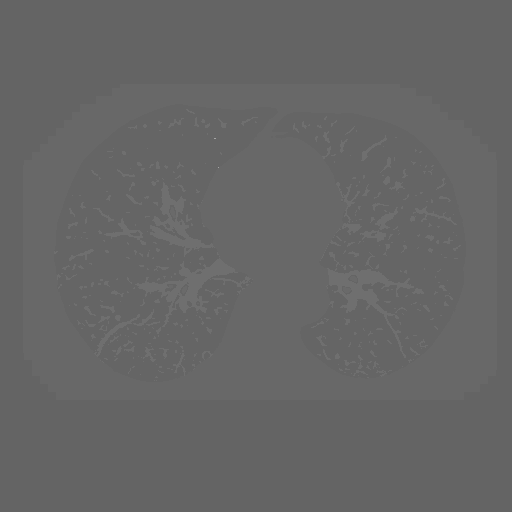
[frame 139/313  lung]
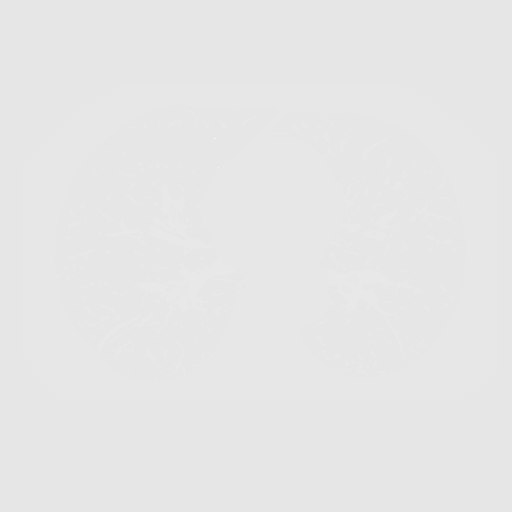
[frame 174/313  lung]
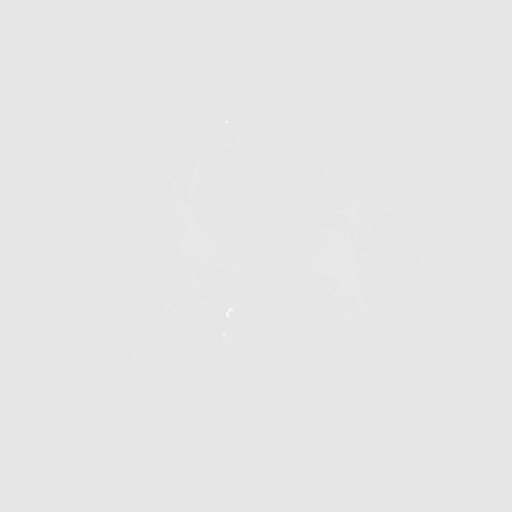
[frame 209/313  lung]
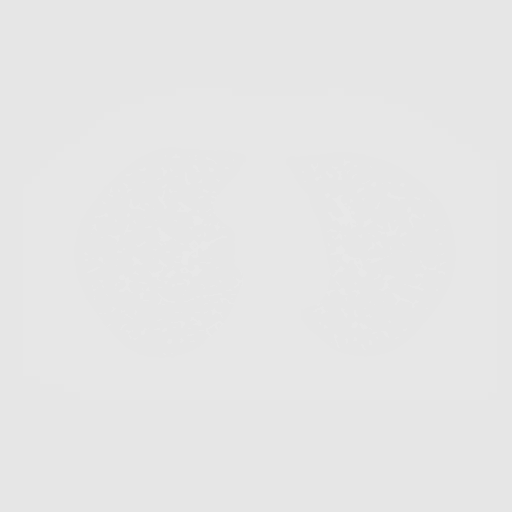
[frame 243/313  lung]
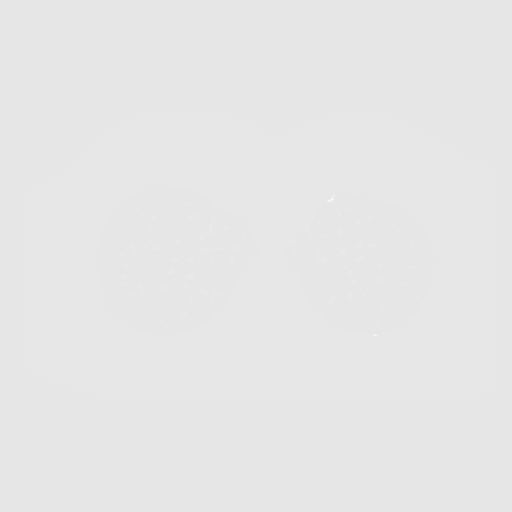
[frame 278/313  mediastinal]
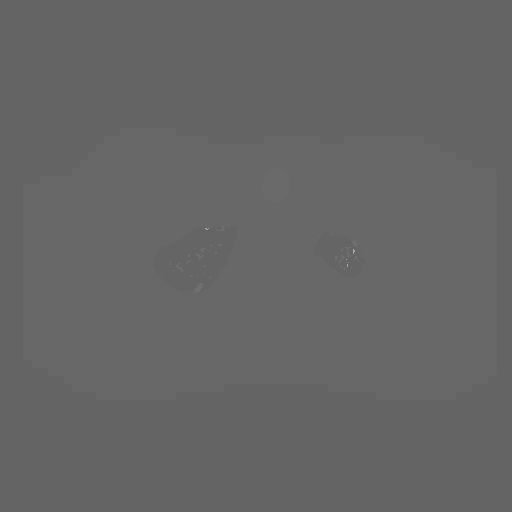
[frame 278/313  lung]
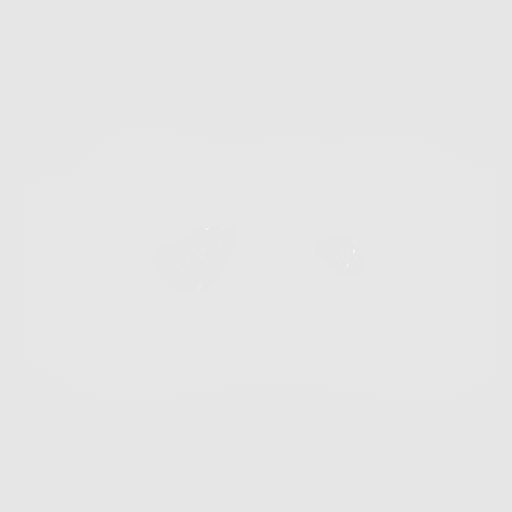
[frame 313/313  lung]
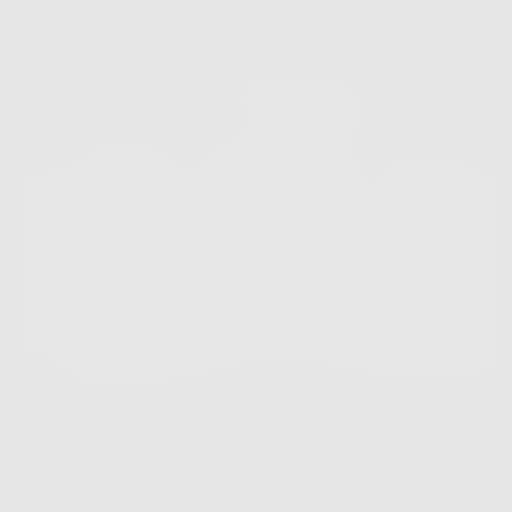

[10 of 10 positions shown; findings below may reference images not displayed]

FINDINGS: Cardiovascular: The heart size is normal. No substantial pericardial
effusion. Coronary artery calcification is evident. Atherosclerotic
calcification is noted in the wall of the thoracic aorta.

Mediastinum/Nodes: No mediastinal lymphadenopathy. No evidence for
gross hilar lymphadenopathy although assessment is limited by the
lack of intravenous contrast on today's study. The esophagus has
normal imaging features. There is no axillary lymphadenopathy.

Lungs/Pleura: Centrilobular and paraseptal emphysema evident.
Previously identified pulmonary nodules are stable or smaller in the
interval. No new suspicious pulmonary nodule or mass. No focal
airspace consolidation. No pleural effusion.

Upper Abdomen: Unremarkable.

Musculoskeletal: No worrisome lytic or sclerotic osseous
abnormality.
IMPRESSION: 1. Lung-RADS 2, benign appearance or behavior. Continue annual
screening with low-dose chest CT without contrast in 12 months.
2. Aortic Atherosclerosis (8MEHE-OCJ.J) and Emphysema (8MEHE-AIH.D).

## 2022-04-15 ENCOUNTER — Other Ambulatory Visit: Payer: Self-pay | Admitting: "Endocrinology

## 2022-06-12 DIAGNOSIS — G894 Chronic pain syndrome: Secondary | ICD-10-CM | POA: Diagnosis not present

## 2022-06-12 DIAGNOSIS — R1012 Left upper quadrant pain: Secondary | ICD-10-CM | POA: Diagnosis not present

## 2022-06-12 DIAGNOSIS — E663 Overweight: Secondary | ICD-10-CM | POA: Diagnosis not present

## 2022-06-12 DIAGNOSIS — Z6825 Body mass index (BMI) 25.0-25.9, adult: Secondary | ICD-10-CM | POA: Diagnosis not present

## 2022-06-12 DIAGNOSIS — E1140 Type 2 diabetes mellitus with diabetic neuropathy, unspecified: Secondary | ICD-10-CM | POA: Diagnosis not present

## 2022-06-19 ENCOUNTER — Telehealth: Payer: Self-pay

## 2022-06-19 ENCOUNTER — Other Ambulatory Visit (HOSPITAL_COMMUNITY): Payer: Self-pay

## 2022-06-19 NOTE — Telephone Encounter (Signed)
Request Reference Number: [id number]. UBRELVY TAB 100MG  is approved through 06/19/2023. Your patient may now fill this prescription and it will be covered.

## 2022-06-19 NOTE — Telephone Encounter (Signed)
Pharmacy Patient Advocate Encounter   Received notification from [facility] that prior authorization for Ubrelvy 100MG  tablets is required/requested.   PA submitted on 06/19/2022 to (ins) [employer] via CoverMyMeds Key or (Medicaid) confirmation # [id number] Status is pending

## 2022-07-09 DIAGNOSIS — E1159 Type 2 diabetes mellitus with other circulatory complications: Secondary | ICD-10-CM | POA: Diagnosis not present

## 2022-07-10 LAB — COMPREHENSIVE METABOLIC PANEL
ALT: 25 IU/L (ref 0–44)
AST: 17 IU/L (ref 0–40)
Albumin/Globulin Ratio: 1.9
Albumin: 4.4 g/dL (ref 3.9–4.9)
Alkaline Phosphatase: 97 IU/L (ref 44–121)
BUN/Creatinine Ratio: 17 (ref 10–24)
BUN: 17 mg/dL (ref 8–27)
Bilirubin Total: 0.2 mg/dL (ref 0.0–1.2)
CO2: 23 mmol/L (ref 20–29)
Calcium: 9.4 mg/dL (ref 8.6–10.2)
Chloride: 101 mmol/L (ref 96–106)
Creatinine, Ser: 0.99 mg/dL (ref 0.76–1.27)
Globulin, Total: 2.3 g/dL (ref 1.5–4.5)
Glucose: 232 mg/dL — ABNORMAL HIGH (ref 70–99)
Potassium: 4.8 mmol/L (ref 3.5–5.2)
Sodium: 138 mmol/L (ref 134–144)
Total Protein: 6.7 g/dL (ref 6.0–8.5)
eGFR: 86 mL/min/{1.73_m2} (ref >59)

## 2022-07-10 LAB — LIPID PANEL
Chol/HDL Ratio: 4.9 ratio (ref 0.0–5.0)
Cholesterol, Total: 158 mg/dL (ref 100–199)
HDL: 32 mg/dL — ABNORMAL LOW (ref >39)
LDL Chol Calc (NIH): 77 mg/dL (ref 0–99)
Triglycerides: 304 mg/dL — ABNORMAL HIGH (ref 0–149)
VLDL Cholesterol Cal: 49 mg/dL — ABNORMAL HIGH (ref 5–40)

## 2022-07-10 LAB — T4, FREE: Free T4: 1.03 ng/dL (ref 0.82–1.77)

## 2022-07-10 LAB — TSH: TSH: 2.53 u[IU]/mL (ref 0.450–4.500)

## 2022-07-11 ENCOUNTER — Ambulatory Visit: Payer: BC Managed Care – PPO | Admitting: "Endocrinology

## 2022-07-11 ENCOUNTER — Encounter: Payer: Self-pay | Admitting: "Endocrinology

## 2022-07-11 VITALS — BP 108/74 | HR 108 | Ht 69.0 in | Wt 170.0 lb

## 2022-07-11 DIAGNOSIS — E1159 Type 2 diabetes mellitus with other circulatory complications: Secondary | ICD-10-CM | POA: Diagnosis not present

## 2022-07-11 DIAGNOSIS — Z794 Long term (current) use of insulin: Secondary | ICD-10-CM

## 2022-07-11 DIAGNOSIS — E782 Mixed hyperlipidemia: Secondary | ICD-10-CM

## 2022-07-11 DIAGNOSIS — I10 Essential (primary) hypertension: Secondary | ICD-10-CM

## 2022-07-11 LAB — POCT GLYCOSYLATED HEMOGLOBIN (HGB A1C): HbA1c, POC (controlled diabetic range): 11.2 % — AB (ref 0.0–7.0)

## 2022-07-11 MED ORDER — FREESTYLE LIBRE 3 READER DEVI: 1.0000 | Freq: Once | 0 refills | Status: AC | PRN

## 2022-07-11 MED ORDER — INSULIN LISPRO PROT & LISPRO (75-25 MIX) 100 UNIT/ML KWIKPEN: 60.0000 [IU] | PEN_INJECTOR | Freq: Two times a day (BID) | SUBCUTANEOUS | 2 refills | Status: DC

## 2022-07-11 MED ORDER — FREESTYLE LIBRE 3 SENSOR MISC: 1.0000 | 2 refills | Status: DC

## 2022-07-11 NOTE — Progress Notes (Signed)
07/11/2022  Endocrinology follow-up note  Subjective:    Patient ID: Nicholas [name], male    DOB: [birth year],    Past Medical History:  Diagnosis Date   Anal lesion    Anal papilla/tags - external lesion   Bilateral carpal tunnel syndrome 11/24/2018   Colon polyp    Hyperplastic rectosigmoid polyp 9/08   Coronary atherosclerosis of native coronary artery    DES to PLB, BMS to mid and distal RCA, DES to circumflex and distal LAD, 6/10 (5 stents)   Diabetic peripheral neuropathy (HCC) 11/24/2018   GERD (gastroesophageal reflux disease)    Helicobacter pylori (H. pylori) 09/2009   Treated with helidac   Hiatal hernia    Hyperplastic colon polyp 2008   Mixed hyperlipidemia    Myocardial infarction (HCC) 06/2008   Seizures (HCC)    Stroke [facility])    January 2018   Type 2 diabetes mellitus [facility])    Past Surgical History:  Procedure Laterality Date   CHOLECYSTECTOMY     COLLAPSED LUNG     15 years ago   COLONOSCOPY  10/12/2006   Dr. Jena Gauss- rectosigmoid polyp s/phot snare polypectomy o/w normal rectum and terminal ileum. hyperplastic polyp on bx   COLONOSCOPY WITH PROPOFOL N/A 09/01/2019   Procedure: COLONOSCOPY WITH PROPOFOL;  Surgeon: Corbin Ade, MD;  Location: [facility] END[facility];  Service: Endoscopy;  Laterality: N/A;  8:30am   EP IMPLANTABLE DEVICE N/A 02/12/2016   Procedure: Loop Recorder Insertion;  Surgeon: Hillis Range, MD;  Location: [facility] INVASIVE CV LAB;  Service: Cardiovascular;  Laterality: N/A;   ESOPHAGOGASTRODUODENOSCOPY  10/12/2006   Dr. Jena Gauss- examination of the tubular esophagus revealed no mucosal abnormalities. the EG junction was easily traversed. small hiatal hernia, the gastric mucosa o/w appeared normal. there was no infiltrating process or frank ulcer seen.   implantable loop recorder removal  09/29/2019   MDT Reveal LINQ removed in office by Dr Johney Frame   TEE WITHOUT CARDIOVERSION N/A 02/12/2016   Procedure: TRANSESOPHAGEAL ECHOCARDIOGRAM (TEE);  Surgeon: Jake Bathe, MD;  Location: [facility] ENDOSCOPY;  Service: Cardiovascular;  Laterality: N/A;   WIRE IN APEX OF RIGHT LUNG     Since childhood   Social History   Socioeconomic History   Marital status: Married    Spouse name: Not on file   Number of children: Not on file   Years of education: Not on file   Highest education level: Not on file  Occupational History   Occupation: maintenance, parking lots    Employer: SELF-EMPLOYED  Tobacco Use   Smoking status: Every Day    Packs/day: 0.50    Years: 34.00    Additional pack years: 0.00    Total pack years: 17.00    Types: Cigarettes    Start date: 04/08/1973   Smokeless tobacco: Never  Vaping Use   Vaping Use: Never used  Substance and Sexual Activity   Alcohol use: No    Alcohol/week: 0.0 standard drinks of alcohol   Drug use: No   Sexual activity: Yes    Partners: Female    Birth control/protection: Pill  Other Topics Concern   Not on file  Social History Narrative   Drinks Coffee 2 cups daily.  Lives sat home with wife.   Is self employed.  Graduated from High school.     Social Determinants of Health   Financial Resource Strain: Not on file  Food Insecurity: Not on file  Transportation Needs: Not on file  Physical Activity: Not  on file  Stress: Not on file  Social Connections: Not on file   Outpatient Encounter Medications as of 07/11/2022  Medication Sig   Continuous Glucose Receiver (FREESTYLE LIBRE 3 READER) DEVI 1 Piece by Does not apply route once as needed for up to 1 dose.   Continuous Glucose Sensor (FREESTYLE LIBRE 3 SENSOR) MISC 1 Piece by Does not apply route every 14 (fourteen) days. Place 1 sensor on the skin every 14 days. Use to check glucose continuously   acetaminophen (TYLENOL) 500 MG tablet Take 500 mg by mouth every 6 (six) hours as needed (for headaches).   albuterol (VENTOLIN HFA) 108 (90 Base) MCG/ACT inhaler Inhale 1-2 puffs into the lungs every 6 (six) hours as needed for wheezing or shortness of  breath.   aspirin EC 81 MG EC tablet Take 1 tablet (81 mg total) by mouth daily. Swallow whole.   BD PEN NEEDLE NANO U/F 32G X 4 MM MISC USE AS DIRECTED FOUR TIMES DAILY.   gabapentin (NEURONTIN) 100 MG capsule Take 100 mg by mouth daily.    Insulin Lispro Prot & Lispro (HUMALOG MIX 75/25 KWIKPEN) (75-25) 100 UNIT/ML Kwikpen Inject 60 Units into the skin 2 (two) times daily with a meal.   nitroGLYCERIN (NITROSTAT) 0.4 MG SL tablet DISSOLVE ONE TABLET UNDER THE TONGUE EVERY 5 MINUTES AS NEEDED FOR CHEST PAIN.  DO NOT EXCEED A TOTAL OF 3 DOSES IN 15 MINUTES   nortriptyline (PAMELOR) 10 MG capsule Take 4 capsules (40 mg total) by mouth at bedtime.   pantoprazole (PROTONIX) 40 MG tablet Take 40 mg by mouth daily before breakfast.   rosuvastatin (CRESTOR) 20 MG tablet Take 1 tablet (20 mg total) by mouth daily.   traMADol (ULTRAM) 50 MG tablet Take 1 tablet (50 mg total) by mouth every 6 (six) hours as needed for moderate pain. (Patient taking differently: Take 50 mg by mouth daily.)   Ubrogepant (UBRELVY) 100 MG TABS Take 100 mg by mouth daily as needed. Take one tablet at onset of headache, may repeat 1 tablet in 2 hours, no more than 2 tablets in 24 hours   [DISCONTINUED] Continuous Blood Gluc Receiver (FREESTYLE LIBRE 2 READER) DEVI As directed   [DISCONTINUED] Continuous Blood Gluc Receiver (FREESTYLE LIBRE 2 READER) DEVI As directed   [DISCONTINUED] Continuous Blood Gluc Sensor (FREESTYLE LIBRE 2 SENSOR) MISC APPLY 1 PATCH TO SKIN ONCE EVERY 14 DAYS   [DISCONTINUED] Insulin Lispro Prot & Lispro (HUMALOG MIX 75/25 KWIKPEN) (75-25) 100 UNIT/ML Kwikpen Inject 70 Units into the skin 2 (two) times daily with a meal. (Patient taking differently: Inject 60 Units into the skin 2 (two) times daily with a meal.)   No facility-administered encounter medications on file as of 07/11/2022.   ALLERGIES: Allergies  Allergen Reactions   Keppra [Levetiracetam]     Hallucination   Topamax [Topiramate]      irritable   VACCINATION STATUS:  There is no immunization history on file for this patient.  Diabetes He presents for his follow-up diabetic visit. He has type 2 diabetes mellitus. Onset time: He was diagnosed at approximate age of 49 years. In this particular patient with a history of heavy alcohol use possiblity of pancreatic diabetes is high. His disease course has been worsening. Pertinent negatives for hypoglycemia include no confusion, headaches, pallor, seizures or speech difficulty. Pertinent negatives for diabetes include no chest pain, no fatigue, no polydipsia, no polyphagia, no polyuria and no weakness. There are no hypoglycemic complications. Symptoms are worsening. Diabetic  complications include a CVA and heart disease. Risk factors for coronary artery disease include diabetes mellitus, dyslipidemia, hypertension, male sex, sedentary lifestyle and tobacco exposure. Current diabetic treatment includes oral agent (dual therapy). He is compliant with treatment some of the time. His weight is fluctuating minimally. He is following a generally unhealthy diet. When asked about meal planning, he reported none. He has not had a previous visit with a dietitian (he did not keep appointment.). He rarely participates in exercise. His home blood glucose trend is increasing steadily. His breakfast blood glucose range is generally >200 mg/dl. His lunch blood glucose range is generally >200 mg/dl. His dinner blood glucose range is generally >200 mg/dl. His bedtime blood glucose range is generally >200 mg/dl. His overall blood glucose range is >200 mg/dl. (Nicholas [name] presents with worsening glycemic profile.  Unfortunately he is not utilizing his CGM device optimally.  He 11 data shows 10% in range, 13%  level 1 hyperglycemia, 77%level 2 hyperglycemia.  Has no hypoglycemia.  Has been using his Humalog 75/25 only 1 time daily.  His point-of-care A1c is 11.2% increasing from 10.9% to his last visit.  His average  blood glucose is 288 over the last 14 days.    ) Eye exam is current.  Hyperlipidemia This is a chronic problem. The current episode started more than 1 year ago. The problem is uncontrolled (He was convinced to take Crestor during his last visit, returns with significant improvement in his LDL from 135 down to 87, triglycerides from 396-215.). Exacerbating diseases include diabetes. Pertinent negatives include no chest pain, myalgias or shortness of breath. Current antihyperlipidemic treatment includes statins. Risk factors for coronary artery disease include dyslipidemia, diabetes mellitus, hypertension and a sedentary lifestyle.  Hypertension This is a chronic problem. The current episode started more than 1 year ago. The problem is controlled. Pertinent negatives include no chest pain, headaches, neck pain, palpitations or shortness of breath. Risk factors for coronary artery disease include diabetes mellitus, dyslipidemia and smoking/tobacco exposure. Past treatments include beta blockers. Hypertensive end-organ damage includes CAD/MI and CVA.    Review of Systems  Constitutional:  Negative for fatigue and unexpected weight change.  HENT:  Negative for dental problem, mouth sores and trouble swallowing.   Eyes:  Negative for visual disturbance.  Respiratory:  Negative for cough, choking, chest tightness, shortness of breath and wheezing.   Cardiovascular:  Negative for chest pain, palpitations and leg swelling.  Gastrointestinal:  Negative for abdominal distention, abdominal pain, constipation, diarrhea, nausea and vomiting.  Endocrine: Negative for polydipsia, polyphagia and polyuria.  Genitourinary:  Negative for dysuria, flank pain, hematuria and urgency.  Musculoskeletal:  Negative for back pain, gait problem, myalgias and neck pain.  Skin:  Negative for pallor, rash and wound.  Neurological:  Negative for seizures, syncope, speech difficulty, weakness, numbness and headaches.   Psychiatric/Behavioral:  Negative for confusion and dysphoric mood.     Objective:    BP 108/74   Pulse (!) 108   Ht 5\' 9"  (1.753 m)   Wt 170 lb (77.1 kg)   BMI 25.10 kg/m   Wt Readings from Last 3 Encounters:  07/11/22 170 lb (77.1 kg)  04/02/22 172 lb (78 kg)  02/26/22 171 lb (77.6 kg)      Results for orders placed or performed in visit on 07/11/22  HgB A1c  Result Value Ref Range   Hemoglobin A1C     HbA1c POC (<> result, manual entry)     HbA1c, POC (prediabetic range)  HbA1c, POC (controlled diabetic range) 11.2 (A) 0.0 - 7.0 %   Diabetic Labs (most recent): Lab Results  Component Value Date   HGBA1C 11.2 (A) 07/11/2022   HGBA1C 10.9 (A) 04/02/2022   HGBA1C 11.2 (A) 12/04/2021   Lipid Panel     Component Value Date/Time   CHOL 158 07/09/2022 0805   TRIG 304 (H) 07/09/2022 0805   HDL 32 (L) 07/09/2022 0805   CHOLHDL 4.9 07/09/2022 0805   CHOLHDL 7.7 (H) 08/30/2020 0741   VLDL 20 05/11/2020 0224   LDLCALC 77 07/09/2022 0805   LDLCALC 139 (H) 08/30/2020 0741     Assessment & Plan:   1. Type 2 diabetes mellitus with vascular disease (HCC) -His diabetes is  complicated by alarming noncompliance/nonadherence, coronary artery disease and patient remains at a high risk for more acute and chronic complications of diabetes which include CAD, CVA, CKD, retinopathy, and neuropathy. These are all discussed in detail with the patient.  Nicholas [name] presents with worsening glycemic profile.  Unfortunately he is not utilizing his CGM device optimally.  He 11 data shows 10% in range, 13% level 1 hyperglycemia, 77% level 2 hyperglycemia.  Has no hypoglycemia.  Has been using his Humalog 75/25 only 1 time daily.  His point-of-care A1c is 11.2% increasing from 10.9% to his last visit.  His average blood glucose is 288 over the last 14 days.     - Suggestion is made for him to avoid simple carbohydrates  from his diet including Cakes, Sweet Desserts, Ice Cream, Soda (diet  and regular), Sweet Tea, Candies, Chips, Cookies, Store Bought Juices, Alcohol in Excess of  1-2 drinks a day, Artificial Sweeteners,  Coffee Creamer, and "Sugar-free" Products, Lemonade. This will help patient to have more stable blood glucose profile and potentially avoid unintended weight gain.  - Patient is advised to stick to a routine mealtimes to eat 3 meals  a day and avoid unnecessary snacks ( to snack only to correct hypoglycemia).  - I have approached patient with the following individualized plan to manage diabetes and patient agrees.  Nicholas [name] remains alarmingly noncompliant, unengaged to self-care.  He is urged to engage to improve his chance of controlling diabetes to target.   He did not comply with basal/bolus insulin previously tried.  He was switched to premixed insulin for simplicity reasons.  He is not committed to use the premixed insulin twice daily either.    He promises to do better and engage with a premixed insulin.  He is advised to continue Humalog 75/25 60 units with breakfast and 60 units with supper  when Premeal blood glucose readings are above 90 mg per DL.    -He is advised to continue monitoring blood glucose continuously, at least 4 times a day.    -He is warned not to inject insulin without proper monitoring of blood glucose.  - He is not the right candidate for SGLT2 inhibitors nor incretin therapy.    -Target numbers for A1c, LDL, HDL, Triglycerides,  were discussed in detail.   2) BP/HTN:  -His blood pressure is controlled to target.   he is advised to continue his current medications including beta blockers.  He has marginal blood pressure, he will not tolerate additional ACE inhibitors. The patient was counseled on the dangers of tobacco use, and was advised to quit.  Reviewed strategies to maximize success, including removing cigarettes and smoking materials from environment.  3) Lipids/HPL: His lipid panel shows improved LDL at 77.  He is advised  to continue Crestor 20 mg milligrams p.o. nightly.  Side effects and precautions discussed with him.  4)  Weight/Diet: His BMI is 25.1-  He is not a candidate for weight loss.  He is  following with  CDE consult, exercise, and carbohydrates information provided.    5) Chronic Care/Health Maintenance:  -Patient is  Statin medications and encouraged to continue to follow up with Ophthalmology, Podiatrist at least yearly or according to recommendations, and advised to  quit Smoking (tragically he resumed smoking after he quit briefly during his diagnosis of CVA ). I have recommended yearly flu vaccine and pneumonia vaccination at least every 5 years; moderate intensity exercise for up to 150 minutes weekly; and  sleep for at least 7 hours a day.  The patient was counseled on the dangers of tobacco use, and was advised to quit.  Reviewed strategies to maximize success, including removing cigarettes and smoking materials from environment.   He recently had normal ABI, will be repeated in November 2026,  or sooner if needed. I advised patient to maintain close follow up with his PCP for primary care needs.   I spent  26  minutes in the care of the patient today including review of labs from CMP, Lipids, Thyroid Function, Hematology (current and previous including abstractions from other facilities); face-to-face time discussing  his blood glucose readings/logs, discussing hypoglycemia and hyperglycemia episodes and symptoms, medications doses, his options of short and long term treatment based on the latest standards of care / guidelines;  discussion about incorporating lifestyle medicine;  and documenting the encounter. Risk reduction counseling performed per USPSTF guidelines to reduce cardiovascular risk factors.     Please refer to Patient Instructions for Blood Glucose Monitoring and Insulin/Medications Dosing Guide"  in media tab for additional information. Please  also refer to " Patient Self  Inventory" in the Media  tab for reviewed elements of pertinent patient history.  Nicholas [name] participated in the discussions, expressed understanding, and voiced agreement with the above plans.  All questions were answered to his satisfaction. he is encouraged to contact clinic should he have any questions or concerns prior to his return visit.   Follow up plan: Return in about 3 months (around 10/11/2022).  Marquis Lunch, MD Phone: [phone]  Fax: [phone]  This note was partially dictated with voice recognition software. Similar sounding words can be transcribed inadequately or may not  be corrected upon review.  07/11/2022, 9:14 AM

## 2022-07-11 NOTE — Patient Instructions (Signed)
                                     Advice for Weight Management  -For most of us the best way to lose weight is by diet management. Generally speaking, diet management means consuming less calories intentionally which over time brings about progressive weight loss.  This can be achieved more effectively by avoiding ultra processed carbohydrates, processed meats, unhealthy fats.    It is critically important to know your numbers: how much calorie you are consuming and how much calorie you need. More importantly, our carbohydrates sources should be unprocessed naturally occurring  complex starch food items.  It is always important to balance nutrition also by  appropriate intake of proteins (mainly plant-based), healthy fats/oils, plenty of fruits and vegetables.   -The American College of Lifestyle Medicine (ACL M) recommends nutrition derived mostly from Whole Food, Plant Predominant Sources example an apple instead of applesauce or apple pie. Eat Plenty of vegetables, Mushrooms, fruits, Legumes, Whole Grains, Nuts, seeds in lieu of processed meats, processed snacks/pastries red meat, poultry, eggs.  Use only water or unsweetened tea for hydration.  The College also recommends the need to stay away from risky substances including alcohol, smoking; obtaining 7-9 hours of restorative sleep, at least 150 minutes of moderate intensity exercise weekly, importance of healthy social connections, and being mindful of stress and seek help when it is overwhelming.    -Sticking to a routine mealtime to eat 3 meals a day and avoiding unnecessary snacks is shown to have a big role in weight control. Under normal circumstances, the only time we burn stored energy is when we are hungry, so allow  some hunger to take place- hunger means no food between appropriate meal times, only water.  It is not advisable to starve.   -It is better to avoid simple carbohydrates including:  Cakes, Sweet Desserts, Ice Cream, Soda (diet and regular), Sweet Tea, Candies, Chips, Cookies, Store Bought Juices, Alcohol in Excess of  1-2 drinks a day, Lemonade,  Artificial Sweeteners, Doughnuts, Coffee Creamers, "Sugar-free" Products, etc, etc.  This is not a complete list.....    -Consulting with certified diabetes educators is proven to provide you with the most accurate and current information on diet.  Also, you may be  interested in discussing diet options/exchanges , we can schedule a visit with Nicholas [name], RDN, CDE for individualized nutrition education.  -Exercise: If you are able: 30 -60 minutes a day ,4 days a week, or 150 minutes of moderate intensity exercise weekly.    The longer the better if tolerated.  Combine stretch, strength, and aerobic activities.  If you were told in the past that you have high risk for cardiovascular diseases, or if you are currently symptomatic, you may seek evaluation by your heart doctor prior to initiating moderate to intense exercise programs.                                  Additional Care Considerations for Diabetes/Prediabetes   -Diabetes  is a chronic disease.  The most important care consideration is regular follow-up with your diabetes care provider with the goal being avoiding or delaying its complications and to take advantage of advances in medications and technology.  If appropriate actions are taken early enough, type 2 diabetes can even be   reversed.  Seek information from the right source.  - Whole Food, Plant Predominant Nutrition is highly recommended: Eat Plenty of vegetables, Mushrooms, fruits, Legumes, Whole Grains, Nuts, seeds in lieu of processed meats, processed snacks/pastries red meat, poultry, eggs as recommended by American College of  Lifestyle Medicine (ACLM).  -Type 2 diabetes is known to coexist with other important comorbidities such as high blood pressure and high cholesterol.  It is critical to control not only the  diabetes but also the high blood pressure and high cholesterol to minimize and delay the risk of complications including coronary artery disease, stroke, amputations, blindness, etc.  The good news is that this diet recommendation for type 2 diabetes is also very helpful for managing high cholesterol and high blood blood pressure.  - Studies showed that people with diabetes will benefit from a class of medications known as ACE inhibitors and statins.  Unless there are specific reasons not to be on these medications, the standard of care is to consider getting one from these groups of medications at an optimal doses.  These medications are generally considered safe and proven to help protect the heart and the kidneys.    - People with diabetes are encouraged to initiate and maintain regular follow-up with eye doctors, foot doctors, dentists , and if necessary heart and kidney doctors.     - It is highly recommended that people with diabetes quit smoking or stay away from smoking, and get yearly  flu vaccine and pneumonia vaccine at least every 5 years.  See above for additional recommendations on exercise, sleep, stress management , and healthy social connections.      

## 2022-07-18 ENCOUNTER — Encounter: Payer: Self-pay | Admitting: "Endocrinology

## 2022-09-02 NOTE — Progress Notes (Signed)
    Cardiology Office Note  Date: 09/03/2022   ID: JAKOBEE SNYDER, DOB Apr 26, 1960, MRN [id number]  History of Present Illness: Nicholas [name] is a 62 y.o. male last seen in January.  He is here for a routine visit.  Reports no angina or nitroglycerin use, stable NYHA class II dyspnea, no palpitations or syncope.  He continues to follow with Dr. Fransico Him, reviewed lab work from June.  LDL was 77 at that time on Crestor 20 mg daily.  We went over the remainder of his medications.  Physical Exam: VS:  BP 126/80   Pulse 100   Ht 5\' 9"  (1.753 m)   Wt 174 lb (78.9 kg)   SpO2 95%   BMI 25.70 kg/m , BMI Body mass index is 25.7 kg/m.  Wt Readings from Last 3 Encounters:  09/03/22 174 lb (78.9 kg)  07/11/22 170 lb (77.1 kg)  04/02/22 172 lb (78 kg)    General: Patient appears comfortable at rest. HEENT: Conjunctiva and lids normal. Neck: Supple, no elevated JVP or carotid bruits. Lungs: Clear to auscultation, nonlabored breathing at rest. Cardiac: Regular rate and rhythm, no S3 or significant systolic murmur. Extremities: No pitting edema.  ECG:  An ECG dated 02/26/2022 was personally reviewed today and demonstrated:  Home sinus tachycardia with PVC, incomplete right bundle branch block.  Labwork: 07/09/2022: ALT 25; AST 17; BUN 17; Creatinine, Ser 0.99; Potassium 4.8; Sodium 138; TSH 2.530     Component Value Date/Time   CHOL 158 07/09/2022 0805   TRIG 304 (H) 07/09/2022 0805   HDL 32 (L) 07/09/2022 0805   CHOLHDL 4.9 07/09/2022 0805   CHOLHDL 7.7 (H) 08/30/2020 0741   VLDL 20 05/11/2020 0224   LDLCALC 77 07/09/2022 0805   LDLCALC 139 (H) 08/30/2020 0741   Other Studies Reviewed Today:  No interval cardiac testing for review today.  Assessment and Plan:  1.  CAD status post DES to the PLB, BMS to the mid and distal RCA, and DES to the circumflex and distal LAD in 2010.  LVEF 60 to 65% by echocardiogram in 2020.  He reports no active angina at this time.  Continue aspirin,  Crestor, and as needed nitroglycerin.  2.  Mixed hyperlipidemia.  LDL 77 in June.  Increase Crestor to 40 mg daily.  Disposition:  Follow up  6 months.  Signed, Jonelle Sidle, M.D., F.A.C.C. [facility]

## 2022-09-03 ENCOUNTER — Ambulatory Visit: Payer: BC Managed Care – PPO | Attending: Cardiology | Admitting: Cardiology

## 2022-09-03 ENCOUNTER — Encounter: Payer: Self-pay | Admitting: Cardiology

## 2022-09-03 VITALS — BP 126/80 | HR 100 | Ht 69.0 in | Wt 174.0 lb

## 2022-09-03 DIAGNOSIS — E782 Mixed hyperlipidemia: Secondary | ICD-10-CM

## 2022-09-03 DIAGNOSIS — I25119 Atherosclerotic heart disease of native coronary artery with unspecified angina pectoris: Secondary | ICD-10-CM | POA: Diagnosis not present

## 2022-09-03 MED ORDER — ROSUVASTATIN CALCIUM 40 MG PO TABS: 40.0000 mg | ORAL_TABLET | Freq: Every day | ORAL | 3 refills | Status: DC

## 2022-09-03 NOTE — Patient Instructions (Signed)
Medication Instructions:   INCREASE Crestor to 40 mg daily  Labwork: None today  Testing/Procedures: None today  Follow-Up: 6 months  Any Other Special Instructions Will Be Listed Below (If Applicable).  If you need a refill on your cardiac medications before your next appointment, please call your pharmacy.

## 2022-09-16 NOTE — Patient Instructions (Signed)

## 2022-09-16 NOTE — Progress Notes (Unsigned)
No chief complaint on file.   HISTORY OF PRESENT ILLNESS:  09/16/22 ALL:  Nicholas [name] returns for follow up for migraines. He continues nortriptyline 40mg  at bedtime and Ubrelvy as needed.   09/16/2021 ALL: Nicholas [name] returns for follow up for migraines. He was last seen 02/2021 and doing fairly well but called in 04/2021 with worsening headaches so we increased nortriptyline to 40mg  daily and continued Ubrelvy as needed. He reports doing well from a headache standpoint. He may have an occasional tension headache but is easily aborted. Has taken Ubrelvy 1-2 times and it worked well. He reports increase in A1C over the past year from 7 to 10. He is working closely with endocrinology. He admits that he does not drink much water and drinks more caffeine. He continues rosuvastatin and asa. No stroke like symptoms.   03/18/2021 ALL:  Nicholas [name] returns for migraines follow up. He continues nortriptyline 30mg  daily at bedtime and rizatriptan as needed. He feels that headaches are usually well managed. He has had milder tension type headaches about 4 times a month. He has taken rizatriptan about 10 times over the past 5 months to avoid migraine. He had one intractable migraine 1/23 and started on divalproex taper. He took divalproex 500mg  for two days and had visual disturbances (zig zag lines, halos). Symptoms improved with decreased dose to 250mg  daily. Headache was aborted with taper. He feels that he has been doing pretty well, since. He admits that he does not drink water. He usually drinks coffee or green tea.   He weaned Vimpat as directed. He denies stroke or seizure symptoms. He is taking asa 81mg  and rosuvastatin 10mg  daily (increased in 12/2020). A1C was 9.5 in 12/2020. He is taking Humalog 75/25 30 units twice daily. LDL was 89, triglycerides were 215. He is followed by PCP and endo regularly.   11/07/2020 ALL:  Nicholas [name] is a 62 y.o. male here today for follow up for headaches and subdural hematoma. Follow  up CT reassuring for resolving subdural 06/2020. He continues Vimpat 500mg  BID for concerns of possible seizure like activity most likely complicated migraine. No obvious seizure activity. He was started on topiramate for complex migraines but could not tolerate and was switched to nortriptyline. He reports headaches are significantly better. He has had two migraines that he was able to abort with rizatriptan, however, has not had any significant migraines in a couple of months. He titrated nortriptyline to 30mg  about 4 weeks ago and is tolerating well. May take Tylenol 1-2 times a week for mild headaches. He is feeling well, today. He continues rosuvastatin and asa 81mg  for stroke prevention.    HISTORY (copied from Dr Anne Hahn' previous note)  Nicholas [name] is a 62 year old right-handed white male with a history of cerebrovascular disease with a prior small left occipital stroke in the past.  The patient has history of coronary artery disease, diabetes, and ongoing tobacco abuse.  He continues to smoke a pack of cigarettes daily.  He has gastroesophageal reflux disease.  The patient was admitted to the hospital on 09 May 2020 with a visual disturbance, headache, and some confusion/aphasia.  The patient reported that in February 2022 that he bumped his head when he fell and hit the left frontotemporal area of his head on a tree.  The patient did have some minor headaches afterwards but this was not an issue for him.  In the third week of March 2022, he went skeet shooting and noted that when he  fired a shotgun that he would get an uncomfortable sensation in the head, if he did not use this shotgun, the sensation did not occur.  On the April 13 admission, he presented with some tunnel vision issues, he had visual disturbance on both visual fields in the periphery and had preservation of center vision.  He had a severe headache, and he also appeared to be confused, he was trying to talk but had garbled speech that was  not understandable.  When he went to the hospital, CT and MRI evaluation demonstrated bilateral small subdural hematoma.  No stroke was noted.  The patient was taken off of Plavix but kept on aspirin.  He was transiently treated with Keppra but was taken off the medication upon discharge.  The patient returned to the hospital on 21 May 2020 with some headache and slight confusion but no visual disturbance.  The patient at that point was placed on Vimpat and has remained on 50 mg twice daily.  The patient has had some minor headaches since coming out of the hospital but no severe headaches or visual disturbance or aphasia has been noted.  The patient reported no numbness or weakness of the face, arms, or legs.  He denied any gait disturbance or difficulty controlling the bowels or the bladder.  He does have some minor neck discomfort.  The patient comes to this office for further evaluation.  He remains on aspirin, off the Plavix.  He has not been operating a motor vehicle.   REVIEW OF SYSTEMS: Out of a complete 14 system review of symptoms, the patient complains only of the following symptoms, headaches and all other reviewed systems are negative.   ALLERGIES: Allergies  Allergen Reactions   Keppra [Levetiracetam]     Hallucination   Topamax [Topiramate]     irritable     HOME MEDICATIONS: Outpatient Medications Prior to Visit  Medication Sig Dispense Refill   acetaminophen (TYLENOL) 500 MG tablet Take 500 mg by mouth every 6 (six) hours as needed (for headaches).     albuterol (VENTOLIN HFA) 108 (90 Base) MCG/ACT inhaler Inhale 1-2 puffs into the lungs every 6 (six) hours as needed for wheezing or shortness of breath.     aspirin EC 81 MG EC tablet Take 1 tablet (81 mg total) by mouth daily. Swallow whole. 30 tablet 11   BD PEN NEEDLE NANO U/F 32G X 4 MM MISC USE AS DIRECTED FOUR TIMES DAILY. 100 each 2   Continuous Glucose Receiver (FREESTYLE LIBRE 3 READER) DEVI 1 Piece by Does not apply  route once as needed for up to 1 dose. 1 each 0   Continuous Glucose Sensor (FREESTYLE LIBRE 3 SENSOR) MISC 1 Piece by Does not apply route every 14 (fourteen) days. Place 1 sensor on the skin every 14 days. Use to check glucose continuously 2 each 2   gabapentin (NEURONTIN) 100 MG capsule Take 100 mg by mouth daily.      Insulin Lispro Prot & Lispro (HUMALOG MIX 75/25 KWIKPEN) (75-25) 100 UNIT/ML Kwikpen Inject 60 Units into the skin 2 (two) times daily with a meal. 30 mL 2   nitroGLYCERIN (NITROSTAT) 0.4 MG SL tablet DISSOLVE ONE TABLET UNDER THE TONGUE EVERY 5 MINUTES AS NEEDED FOR CHEST PAIN.  DO NOT EXCEED A TOTAL OF 3 DOSES IN 15 MINUTES 25 tablet 3   nortriptyline (PAMELOR) 10 MG capsule Take 4 capsules (40 mg total) by mouth at bedtime. 360 capsule 3   pantoprazole (PROTONIX) 40  MG tablet Take 40 mg by mouth daily before breakfast.     rosuvastatin (CRESTOR) 40 MG tablet Take 1 tablet (40 mg total) by mouth daily. 90 tablet 3   traMADol (ULTRAM) 50 MG tablet Take 1 tablet (50 mg total) by mouth every 6 (six) hours as needed for moderate pain. (Patient taking differently: Take 50 mg by mouth daily.) 15 tablet 0   Ubrogepant (UBRELVY) 100 MG TABS Take 100 mg by mouth daily as needed. Take one tablet at onset of headache, may repeat 1 tablet in 2 hours, no more than 2 tablets in 24 hours 10 tablet 11   No facility-administered medications prior to visit.     PAST MEDICAL HISTORY: Past Medical History:  Diagnosis Date   Anal lesion    Anal papilla/tags - external lesion   Bilateral carpal tunnel syndrome 11/24/2018   Colon polyp    Hyperplastic rectosigmoid polyp 9/08   Coronary atherosclerosis of native coronary artery    DES to PLB, BMS to mid and distal RCA, DES to circumflex and distal LAD, 6/10 (5 stents)   Diabetic peripheral neuropathy (HCC) 11/24/2018   GERD (gastroesophageal reflux disease)    Helicobacter pylori (H. pylori) 09/2009   Treated with helidac   Hiatal hernia     Hyperplastic colon polyp 2008   Mixed hyperlipidemia    Myocardial infarction (HCC) 06/2008   Seizures (HCC)    Stroke [facility])    January 2018   Type 2 diabetes mellitus (HCC)      PAST SURGICAL HISTORY: Past Surgical History:  Procedure Laterality Date   CHOLECYSTECTOMY     COLLAPSED LUNG     15 years ago   COLONOSCOPY  10/12/2006   Dr. Jena Gauss- rectosigmoid polyp s/phot snare polypectomy o/w normal rectum and terminal ileum. hyperplastic polyp on bx   COLONOSCOPY WITH PROPOFOL N/A 09/01/2019   Procedure: COLONOSCOPY WITH PROPOFOL;  Surgeon: Corbin Ade, MD;  Location: [facility] ENDO SUITE;  Service: Endoscopy;  Laterality: N/A;  8:30am   EP IMPLANTABLE DEVICE N/A 02/12/2016   Procedure: Loop Recorder Insertion;  Surgeon: Hillis Range, MD;  Location: [facility] INVASIVE CV LAB;  Service: Cardiovascular;  Laterality: N/A;   ESOPHAGOGASTRODUODENOSCOPY  10/12/2006   Dr. Jena Gauss- examination of the tubular esophagus revealed no mucosal abnormalities. the EG junction was easily traversed. small hiatal hernia, the gastric mucosa o/w appeared normal. there was no infiltrating process or frank ulcer seen.   implantable loop recorder removal  09/29/2019   MDT Reveal LINQ removed in office by Dr Johney Frame   TEE WITHOUT CARDIOVERSION N/A 02/12/2016   Procedure: TRANSESOPHAGEAL ECHOCARDIOGRAM (TEE);  Surgeon: Jake Bathe, MD;  Location: [facility] ENDOSCOPY;  Service: Cardiovascular;  Laterality: N/A;   WIRE IN APEX OF RIGHT LUNG     Since childhood     FAMILY HISTORY: Family History  Problem Relation Age of Onset   Diabetes Mother    Heart disease Father    Cancer Brother    CVA Neg Hx      SOCIAL HISTORY: Social History   Socioeconomic History   Marital status: Married    Spouse name: Not on file   Number of children: Not on file   Years of education: Not on file   Highest education level: Not on file  Occupational History   Occupation: maintenance, parking lots    Employer: SELF-EMPLOYED  Tobacco Use    Smoking status: Every Day    Current packs/day: 0.50    Average packs/day: 0.5 packs/day  for 49.4 years (24.7 ttl pk-yrs)    Types: Cigarettes    Start date: 04/08/1973   Smokeless tobacco: Never  Vaping Use   Vaping status: Never Used  Substance and Sexual Activity   Alcohol use: No    Alcohol/week: 0.0 standard drinks of alcohol   Drug use: No   Sexual activity: Yes    Partners: Female    Birth control/protection: Pill  Other Topics Concern   Not on file  Social History Narrative   Drinks Coffee 2 cups daily.  Lives sat home with wife.   Is self employed.  Graduated from High school.     Social Determinants of Health   Financial Resource Strain: Not on file  Food Insecurity: Not on file  Transportation Needs: Not on file  Physical Activity: Not on file  Stress: Not on file  Social Connections: Not on file  Intimate Partner Violence: Not on file     PHYSICAL EXAM  There were no vitals filed for this visit.    There is no height or weight on file to calculate BMI.  Generalized: Well developed, in no acute distress  Cardiology: normal rate and rhythm, no murmur auscultated  Respiratory: clear to auscultation bilaterally    Neurological examination  Mentation: Alert oriented to time, place, history taking. Follows all commands speech and language fluent Cranial nerve II-XII: Pupils were equal round reactive to light. Extraocular movements were full, visual field were full on confrontational test. Facial sensation and strength were normal. Head turning and shoulder shrug  were normal and symmetric. Motor: The motor testing reveals 5 over 5 strength of all 4 extremities. Good symmetric motor tone is noted throughout.  Sensory: Sensory testing is intact to soft touch on all 4 extremities. No evidence of extinction is noted.  Coordination: Cerebellar testing reveals good finger-nose-finger and heel-to-shin bilaterally.  Gait and station: Gait is normal.  Reflexes: Deep  tendon reflexes are symmetric and normal bilaterally.    DIAGNOSTIC DATA (LABS, IMAGING, TESTING) - I reviewed patient records, labs, notes, testing and imaging myself where available.  Lab Results  Component Value Date   WBC 12.2 (H) 07/24/2020   HGB 15.5 07/24/2020   HCT 47.5 07/24/2020   MCV 83.9 07/24/2020   PLT 175 07/24/2020      Component Value Date/Time   NA 138 07/09/2022 0805   K 4.8 07/09/2022 0805   CL 101 07/09/2022 0805   CO2 23 07/09/2022 0805   GLUCOSE 232 (H) 07/09/2022 0805   GLUCOSE 207 (H) 07/24/2020 2124   BUN 17 07/09/2022 0805   CREATININE 0.99 07/09/2022 0805   CREATININE 0.78 05/02/2019 0918   CALCIUM 9.4 07/09/2022 0805   PROT 6.7 07/09/2022 0805   ALBUMIN 4.4 07/09/2022 0805   AST 17 07/09/2022 0805   ALT 25 07/09/2022 0805   ALKPHOS 97 07/09/2022 0805   BILITOT 0.2 07/09/2022 0805   GFRNONAA >60 07/24/2020 2124   GFRNONAA 98 09/02/2018 0725   GFRAA 115 10/20/2018 0954   GFRAA 114 09/02/2018 0725   Lab Results  Component Value Date   CHOL 158 07/09/2022   HDL 32 (L) 07/09/2022   LDLCALC 77 07/09/2022   TRIG 304 (H) 07/09/2022   CHOLHDL 4.9 07/09/2022   Lab Results  Component Value Date   HGBA1C 11.2 (A) 07/11/2022   Lab Results  Component Value Date   VITAMINB12 404 02/11/2016   Lab Results  Component Value Date   TSH 2.530 07/09/2022  No data to display               No data to display           ASSESSMENT AND PLAN  62 y.o. year old male  has a past medical history of Anal lesion, Bilateral carpal tunnel syndrome (11/24/2018), Colon polyp, Coronary atherosclerosis of native coronary artery, Diabetic peripheral neuropathy (HCC) (11/24/2018), GERD (gastroesophageal reflux disease), Helicobacter pylori (H. pylori) (09/2009), Hiatal hernia, Hyperplastic colon polyp (2008), Mixed hyperlipidemia, Myocardial infarction (HCC) (06/2008), Seizures (HCC), Stroke (HCC), and Type 2 diabetes mellitus (HCC). here with     No diagnosis found.  Nicholas [name] is doing well, today. No seizure activity and headaches are significantly improved. No complex symptoms since April 2022. We will continue nortriptyline 40mg  daily at bedtime. He will continue [place] as needed. Will avoid triptans due to stroke history. We have reviewed last A1C and stroke risk factors. He will follow up closely with endocrinology.  I have encouraged him to increase water intake. Healthy lifestyle habits encouraged. He will continue stroke prevention and co morbidity follow up with PCP. He will return to see me in 1 year. He verbalizes understanding and agreement with this plan.    No orders of the defined types were placed in this encounter.    No orders of the defined types were placed in this encounter.      Shawnie Dapper, MSN, FNP-C 09/16/2022, 4:31 PM  [facility] [address] [place], [place] [address] [phone]

## 2022-09-17 ENCOUNTER — Encounter: Payer: Self-pay | Admitting: Family Medicine

## 2022-09-17 ENCOUNTER — Ambulatory Visit: Payer: BC Managed Care – PPO | Admitting: Family Medicine

## 2022-09-17 DIAGNOSIS — G43109 Migraine with aura, not intractable, without status migrainosus: Secondary | ICD-10-CM | POA: Diagnosis not present

## 2022-09-17 MED ORDER — UBRELVY 100 MG PO TABS: 100.0000 mg | ORAL_TABLET | Freq: Every day | ORAL | 11 refills | Status: AC | PRN

## 2022-09-17 MED ORDER — NORTRIPTYLINE HCL 10 MG PO CAPS: 40.0000 mg | ORAL_CAPSULE | Freq: Every day | ORAL | 3 refills | Status: DC

## 2022-10-16 DIAGNOSIS — I10 Essential (primary) hypertension: Secondary | ICD-10-CM | POA: Diagnosis not present

## 2022-10-16 DIAGNOSIS — E663 Overweight: Secondary | ICD-10-CM | POA: Diagnosis not present

## 2022-10-16 DIAGNOSIS — Z6825 Body mass index (BMI) 25.0-25.9, adult: Secondary | ICD-10-CM | POA: Diagnosis not present

## 2022-10-16 DIAGNOSIS — I25119 Atherosclerotic heart disease of native coronary artery with unspecified angina pectoris: Secondary | ICD-10-CM | POA: Diagnosis not present

## 2022-10-16 DIAGNOSIS — G894 Chronic pain syndrome: Secondary | ICD-10-CM | POA: Diagnosis not present

## 2022-10-16 DIAGNOSIS — E1140 Type 2 diabetes mellitus with diabetic neuropathy, unspecified: Secondary | ICD-10-CM | POA: Diagnosis not present

## 2022-10-23 ENCOUNTER — Encounter: Payer: Self-pay | Admitting: Nurse Practitioner

## 2022-10-23 ENCOUNTER — Ambulatory Visit: Payer: BC Managed Care – PPO | Attending: Nurse Practitioner | Admitting: Nurse Practitioner

## 2022-10-23 VITALS — BP 130/70 | HR 113 | Ht 69.0 in | Wt 175.6 lb

## 2022-10-23 DIAGNOSIS — R079 Chest pain, unspecified: Secondary | ICD-10-CM

## 2022-10-23 DIAGNOSIS — I25119 Atherosclerotic heart disease of native coronary artery with unspecified angina pectoris: Secondary | ICD-10-CM | POA: Diagnosis not present

## 2022-10-23 DIAGNOSIS — Z8673 Personal history of transient ischemic attack (TIA), and cerebral infarction without residual deficits: Secondary | ICD-10-CM

## 2022-10-23 DIAGNOSIS — Z720 Tobacco use: Secondary | ICD-10-CM

## 2022-10-23 DIAGNOSIS — R0602 Shortness of breath: Secondary | ICD-10-CM

## 2022-10-23 DIAGNOSIS — M79602 Pain in left arm: Secondary | ICD-10-CM

## 2022-10-23 DIAGNOSIS — R0609 Other forms of dyspnea: Secondary | ICD-10-CM

## 2022-10-23 DIAGNOSIS — I10 Essential (primary) hypertension: Secondary | ICD-10-CM

## 2022-10-23 DIAGNOSIS — E782 Mixed hyperlipidemia: Secondary | ICD-10-CM

## 2022-10-23 DIAGNOSIS — R000 Tachycardia, unspecified: Secondary | ICD-10-CM

## 2022-10-23 DIAGNOSIS — M79601 Pain in right arm: Secondary | ICD-10-CM

## 2022-10-23 MED ORDER — METOPROLOL SUCCINATE ER 25 MG PO TB24: 12.5000 mg | ORAL_TABLET | Freq: Every day | ORAL | 1 refills | Status: DC

## 2022-10-23 NOTE — Progress Notes (Addendum)
Cardiology Office Note:  .   Date:  10/23/2022 ID:  [name], DOB [birth year], MRN [id number] PCP: Assunta Found, MD  [facility] Providers Cardiologist:  Nona Dell, MD    History of Present Illness: .   KAMAURION DEDES is a 62 y.o. 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  Last seen by Dr. Diona Browner on September 03, 2022.  He was doing well at the time.  Today he presents for shortness of breath evaluation.  He states he was recently evaluated by his primary care provider and it was suggested that he follow-up with cardiology due to his symptoms.  Last Saturday night, experienced some difficulty breathing, bilateral arm pain, had to sit down for few hours for symptoms to be relieved.  This past Monday, he admitted to chest heaviness that was a one-time episode.  States his most concerning symptoms are shortness of breath that has been intermittent for the last 2 to 3 months and is now more consistent with bilateral arm pain, occurs with exertion. Denies any palpitations, syncope, presyncope, dizziness, orthopnea, PND, swelling or significant weight changes, acute bleeding, or claudication.  Smokes a couple packs of cigarettes per day, understands he needs to quit smoking.  Drinks " a couple cups of coffee," per day-says around 2 cups/day per his report.  ROS: Negative. See HPI.   Studies Reviewed: Marland Kitchen    EKG: EKG Interpretation Date/Time:  Thursday October 23 2022 12:00:27 EDT Ventricular Rate:  105 PR Interval:  154 QRS Duration:  116 QT Interval:  362 QTC Calculation: 478 R Axis:   69  Text Interpretation: Sinus tachycardia Right bundle branch block When compared with ECG of 24-Jul-2020 21:09, Borderline criteria for Inferior infarct are no longer Present Confirmed by Sharlene Dory [phone]) on 10/23/2022  12:02:37 PM    Echocardiogram 02/2018: 1. The left ventricle has normal systolic function of 60-65%. The cavity  size was normal. There is no increased left ventricular wall thickness.  Echo evidence of normal diastolic relaxation.   2. No evidence of left ventricular regional wall motion abnormalities.   3. The right ventricle has normal systolic function. The cavity was  normal. There is no increase in right ventricular wall thickness. Right  ventricular systolic pressure could not be assessed.   4. The aortic valve is tricuspid. There is moderate calcification of the  aortic valve. There is mild to moderate aortic annular calcification  noted.   5. The mitral valve is normal in structure. There is mild calcification.   6. The tricuspid valve is normal in structure.   7. The aortic root is normal in size and structure.  Lexiscan 06/2014: There was no ST segment deviation noted during stress. Findings consistent with prior inferolatral myocardial infarction with mild peri-infarct ischemia. This is a low risk study. The left ventricular ejection fraction is normal (55-65%).  Physical Exam:   VS:  BP 130/70   Pulse (!) 113   Ht 5\' 9"  (1.753 m)   Wt 175 lb 9.6 oz (79.7 kg)   SpO2 95%   BMI 25.93 kg/m    Wt Readings from Last 3 Encounters:  10/23/22 175 lb 9.6 oz (79.7 kg)  09/17/22 171 lb (77.6 kg)  09/03/22 174 lb (78.9 kg)    GEN: Well nourished, well developed in no acute distress NECK: No JVD; No carotid  bruits CARDIAC: S1/S2, fast rate and regular rhythm, no murmurs, rubs, gallops RESPIRATORY:  Clear to auscultation without rales, wheezing or rhonchi  ABDOMEN: Soft, non-tender, non-distended EXTREMITIES:  No edema; No deformity   ASSESSMENT AND PLAN: .    CAD, chest pain of uncertain etiology and shortness of breath with exertion Prior history of drug-eluting stent to the PLB, bare-metal stent to the mid and distal RCA, and drug-eluting stent to the distal and  circumflex LAD in 2010.  He had an episode of chest pain this past Monday, difficult to recall.  Continues to note shortness of breath with exertion intermittently over the past 2 to 3 months.  It has been a while since he has had ischemic evaluation.  Discussed ischemic evaluation including Lexiscan and risk and benefits, and he verbalized understanding and is agreeable to proceed.  Will also arrange echocardiogram to evaluate for any wall motion abnormalities and to evaluate EF.  Continue aspirin, rosuvastatin, and nitroglycerin as needed.  Will add metoprolol succinate 12.5 mg daily to medication regimen.  Care and ED precautions discussed.    Informed Consent   Shared Decision Making/Informed Consent The risks [chest pain, shortness of breath, cardiac arrhythmias, dizziness, blood pressure fluctuations, myocardial infarction, stroke/transient ischemic attack, nausea, vomiting, allergic reaction, radiation exposure, metallic taste sensation and life-threatening complications (estimated to be 1 in 10,000)], benefits (risk stratification, diagnosing coronary artery disease, treatment guidance) and alternatives of a nuclear stress test were discussed in detail with Mr. Khanna and he agrees to proceed.  2.  Hypertension, Tachycardia Blood pressure 130/70. Discussed SBP goal < 130. HR 113 on exam, most likely due to caffeine use.  Discussed to wean off caffeine.  Will start metoprolol succinate 12.5 mg daily. No other medication changes at this time. Discussed to monitor BP at home at least 2 hours after medications and sitting for 5-10 minutes. Heart healthy diet and regular cardiovascular exercise encouraged.   3.  Mixed hyperlipidemia, bilateral arm pain LDL 77 06/2022.  Continue rosuvastatin 40 mg daily.  Bilateral arm pain -etiology multifactorial.  Will obtain carotid duplex to further evaluate his bilateral arm pain, specifically evaluate for PAD - has several risk factors for PAD. May need to further  evaluate by getting arterial duplex of bilateral upper arms. Heart healthy diet and regular cardiovascular exercise encouraged. Plan to recheck labs- FLP and LFT at next office visit at [facility] increased last office visit.   4. Hx of CVA/TIA Denies any strokelike symptoms, except for bilateral arm pain - see above (etiology multifactorial).  Continue current medication regimen. Heart healthy diet and regular cardiovascular exercise encouraged.  ED precautions discussed.  5. Tobacco abuse Smoking cessation encouraged and discussed.  Dispo: Follow-up with me/[facility] in 6-8 weeks or sooner if anything changes.   Signed, Sharlene Dory, NP

## 2022-10-23 NOTE — Patient Instructions (Addendum)
Medication Instructions:  Your physician has recommended you make the following change in your medication:  Please start Metoprolol Succinate 12.5 Mg daily   Labwork: None  Testing/Procedures: Your physician has requested that you have an echocardiogram. Echocardiography is a painless test that uses sound waves to create images of your heart. It provides your doctor with information about the size and shape of your heart and how well your heart's chambers and valves are working. This procedure takes approximately one hour. There are no restrictions for this procedure. Please do NOT wear cologne, perfume, aftershave, or lotions (deodorant is allowed). Please arrive 15 minutes prior to your appointment time.  Your physician has requested that you have a lexiscan myoview. For further information please visit [email or web address] Please follow instruction sheet, as given.  Your physician has requested that you have a carotid duplex. This test is an ultrasound of the carotid arteries in your neck. It looks at blood flow through these arteries that supply the brain with blood. Allow one hour for this exam. There are no restrictions or special instructions.  Follow-Up: Your physician recommends that you schedule a follow-up appointment in: 6-8 weeks   Any Other Special Instructions Will Be Listed Below (If Applicable).  If you need a refill on your cardiac medications before your next appointment, please call your pharmacy.

## 2022-10-24 ENCOUNTER — Ambulatory Visit: Payer: BC Managed Care – PPO | Admitting: "Endocrinology

## 2022-10-24 ENCOUNTER — Encounter: Payer: Self-pay | Admitting: "Endocrinology

## 2022-10-24 VITALS — BP 108/76 | HR 100 | Ht 69.0 in | Wt 175.9 lb

## 2022-10-24 DIAGNOSIS — Z794 Long term (current) use of insulin: Secondary | ICD-10-CM | POA: Diagnosis not present

## 2022-10-24 DIAGNOSIS — I10 Essential (primary) hypertension: Secondary | ICD-10-CM

## 2022-10-24 DIAGNOSIS — E782 Mixed hyperlipidemia: Secondary | ICD-10-CM | POA: Diagnosis not present

## 2022-10-24 DIAGNOSIS — E1159 Type 2 diabetes mellitus with other circulatory complications: Secondary | ICD-10-CM

## 2022-10-24 LAB — POCT GLYCOSYLATED HEMOGLOBIN (HGB A1C): HbA1c, POC (controlled diabetic range): 9.6 % — AB (ref 0.0–7.0)

## 2022-10-24 MED ORDER — INSULIN LISPRO PROT & LISPRO (75-25 MIX) 100 UNIT/ML KWIKPEN: 60.0000 [IU] | PEN_INJECTOR | Freq: Two times a day (BID) | SUBCUTANEOUS | 2 refills | Status: DC

## 2022-10-24 MED ORDER — FREESTYLE LIBRE 3 SENSOR MISC: 1.0000 | 3 refills | Status: DC

## 2022-10-24 NOTE — Progress Notes (Signed)
10/24/2022  Endocrinology follow-up note  Subjective:    Patient ID: Nicholas [name], male    DOB: May 30, 1960,    Past Medical History:  Diagnosis Date   Anal lesion    Anal papilla/tags - external lesion   Bilateral carpal tunnel syndrome 11/24/2018   Colon polyp    Hyperplastic rectosigmoid polyp 9/08   Coronary atherosclerosis of native coronary artery    DES to PLB, BMS to mid and distal RCA, DES to circumflex and distal LAD, 6/10 (5 stents)   Diabetic peripheral neuropathy (HCC) 11/24/2018   GERD (gastroesophageal reflux disease)    Helicobacter pylori (H. pylori) 09/2009   Treated with helidac   Hiatal hernia    Hyperplastic colon polyp 2008   Mixed hyperlipidemia    Myocardial infarction (HCC) 06/2008   Seizures (HCC)    Stroke [facility])    January 2018   Type 2 diabetes mellitus [facility])    Past Surgical History:  Procedure Laterality Date   CHOLECYSTECTOMY     COLLAPSED LUNG     15 years ago   COLONOSCOPY  10/12/2006   Dr. Jena Gauss- rectosigmoid polyp s/phot snare polypectomy o/w normal rectum and terminal ileum. hyperplastic polyp on bx   COLONOSCOPY WITH PROPOFOL N/A 09/01/2019   Procedure: COLONOSCOPY WITH PROPOFOL;  Surgeon: Corbin Ade, MD;  Location: [facility] ENDO SUITE;  Service: Endoscopy;  Laterality: N/A;  8:30am   EP IMPLANTABLE DEVICE N/A 02/12/2016   Procedure: Loop Recorder Insertion;  Surgeon: Hillis Range, MD;  Location: [facility] INVASIVE CV LAB;  Service: Cardiovascular;  Laterality: N/A;   ESOPHAGOGASTRODUODENOSCOPY  10/12/2006   Dr. Jena Gauss- examination of the tubular esophagus revealed no mucosal abnormalities. the EG junction was easily traversed. small hiatal hernia, the gastric mucosa o/w appeared normal. there was no infiltrating process or frank ulcer seen.   implantable loop recorder removal  09/29/2019   MDT Reveal LINQ removed in office by Dr Johney Frame   TEE WITHOUT CARDIOVERSION N/A 02/12/2016   Procedure: TRANSESOPHAGEAL ECHOCARDIOGRAM (TEE);  Surgeon: Jake Bathe, MD;  [facility] ENDOSCOPY;  Service: Cardiovascular;  Laterality: N/A;   WIRE IN APEX OF RIGHT LUNG     Since childhood   Social History   Socioeconomic History   Marital status: Married    Spouse name: Not on file   Number of children: Not on file   Years of education: Not on file   Highest education level: Not on file  Occupational History   Occupation: maintenance, parking lots    Employer: SELF-EMPLOYED  Tobacco Use   Smoking status: Every Day    Current packs/day: 0.50    Average packs/day: 0.5 packs/day for 49.5 years (24.8 ttl pk-yrs)    Types: Cigarettes    Start date: 04/08/1973   Smokeless tobacco: Never  Vaping Use   Vaping status: Never Used  Substance and Sexual Activity   Alcohol use: No    Alcohol/week: 0.0 standard drinks of alcohol   Drug use: No   Sexual activity: Yes    Partners: Female    Birth control/protection: Pill  Other Topics Concern   Not on file  Social History Narrative   Drinks Coffee 2 cups daily.  Lives sat home with wife.   Is self employed.  Graduated from High school.     Social Determinants of Health   Financial Resource Strain: Not on file  Food Insecurity: Not on file  Transportation Needs: Not on file  Physical Activity: Not on file  Stress: Not  on file  Social Connections: Not on file   Outpatient Encounter Medications as of 10/24/2022  Medication Sig   acetaminophen (TYLENOL) 500 MG tablet Take 500 mg by mouth every 6 (six) hours as needed (for headaches).   albuterol (VENTOLIN HFA) 108 (90 Base) MCG/ACT inhaler Inhale 1-2 puffs into the lungs every 6 (six) hours as needed for wheezing or shortness of breath.   aspirin EC 81 MG EC tablet Take 1 tablet (81 mg total) by mouth daily. Swallow whole.   BD PEN NEEDLE NANO U/F 32G X 4 MM MISC USE AS DIRECTED FOUR TIMES DAILY.   Continuous Glucose Receiver (FREESTYLE LIBRE 3 READER) DEVI 1 Piece by Does not apply route once as needed for up to 1 dose.   Continuous Glucose  Sensor (FREESTYLE LIBRE 3 SENSOR) MISC 1 Piece by Does not apply route every 14 (fourteen) days. Place 1 sensor on the skin every 14 days. Use to check glucose continuously   gabapentin (NEURONTIN) 100 MG capsule Take 100 mg by mouth daily.    Insulin Lispro Prot & Lispro (HUMALOG MIX 75/25 KWIKPEN) (75-25) 100 UNIT/ML Kwikpen Inject 60 Units into the skin 2 (two) times daily with a meal.   metoprolol succinate (TOPROL-XL) 25 MG 24 hr tablet Take 0.5 tablets (12.5 mg total) by mouth daily.   nitroGLYCERIN (NITROSTAT) 0.4 MG SL tablet DISSOLVE ONE TABLET UNDER THE TONGUE EVERY 5 MINUTES AS NEEDED FOR CHEST PAIN.  DO NOT EXCEED A TOTAL OF 3 DOSES IN 15 MINUTES   nortriptyline (PAMELOR) 10 MG capsule Take 4 capsules (40 mg total) by mouth at bedtime.   pantoprazole (PROTONIX) 40 MG tablet Take 40 mg by mouth daily before breakfast.   rosuvastatin (CRESTOR) 40 MG tablet Take 1 tablet (40 mg total) by mouth daily.   traMADol (ULTRAM) 50 MG tablet Take 1 tablet (50 mg total) by mouth every 6 (six) hours as needed for moderate pain. (Patient taking differently: Take 50 mg by mouth daily.)   Ubrogepant (UBRELVY) 100 MG TABS Take 1 tablet (100 mg total) by mouth daily as needed. Take one tablet at onset of headache, may repeat 1 tablet in 2 hours, no more than 2 tablets in 24 hours   [DISCONTINUED] Continuous Glucose Sensor (FREESTYLE LIBRE 3 SENSOR) MISC 1 Piece by Does not apply route every 14 (fourteen) days. Place 1 sensor on the skin every 14 days. Use to check glucose continuously   [DISCONTINUED] Insulin Lispro Prot & Lispro (HUMALOG MIX 75/25 KWIKPEN) (75-25) 100 UNIT/ML Kwikpen Inject 60 Units into the skin 2 (two) times daily with a meal.   No facility-administered encounter medications on file as of 10/24/2022.   ALLERGIES: Allergies  Allergen Reactions   Keppra [Levetiracetam]     Hallucination   Topamax [Topiramate]     irritable   VACCINATION STATUS:  There is no immunization history on  file for this patient.  Diabetes He presents for his follow-up diabetic visit. He has type 2 diabetes mellitus. Onset time: He was diagnosed at approximate age of 49 years. In this particular patient with a history of heavy alcohol use possiblity of pancreatic diabetes is high. His disease course has been worsening. Pertinent negatives for hypoglycemia include no confusion, headaches, pallor, seizures or speech difficulty. Pertinent negatives for diabetes include no chest pain, no fatigue, no polydipsia, no polyphagia, no polyuria and no weakness. There are no hypoglycemic complications. Symptoms are worsening. Diabetic complications include a CVA and heart disease. Risk factors for coronary artery  disease include diabetes mellitus, dyslipidemia, hypertension, male sex, sedentary lifestyle and tobacco exposure. Current diabetic treatment includes oral agent (dual therapy). He is compliant with treatment some of the time. His weight is fluctuating minimally. He is following a generally unhealthy diet. When asked about meal planning, he reported none. He has not had a previous visit with a dietitian (he did not keep appointment.). He rarely participates in exercise. His home blood glucose trend is decreasing steadily. His breakfast blood glucose range is generally >200 mg/dl. His lunch blood glucose range is generally >200 mg/dl. His dinner blood glucose range is generally >200 mg/dl. His bedtime blood glucose range is generally >200 mg/dl. His overall blood glucose range is >200 mg/dl. (Mr. Aujla presents with slight improvement in his glycemic profile averaging 221 over the last 14 days.  His AGP shows 26% time in range, 29% level 1 hyperglycemia, 45% level 2 hyperglycemia.  This is considered an improvement from his previous presentation where he did have only 11% time in range.   His point-of-care A1c is 9.6%, improving from 11.2%.   ) Eye exam is current.  Hyperlipidemia This is a chronic problem. The  current episode started more than 1 year ago. The problem is uncontrolled (He was convinced to take Crestor during his last visit, returns with significant improvement in his LDL from 135 down to 87, triglycerides from 396-215.). Exacerbating diseases include diabetes. Pertinent negatives include no chest pain, myalgias or shortness of breath. Current antihyperlipidemic treatment includes statins. Risk factors for coronary artery disease include dyslipidemia, diabetes mellitus, hypertension and a sedentary lifestyle.  Hypertension This is a chronic problem. The current episode started more than 1 year ago. The problem is controlled. Pertinent negatives include no chest pain, headaches, neck pain, palpitations or shortness of breath. Risk factors for coronary artery disease include diabetes mellitus, dyslipidemia and smoking/tobacco exposure. Past treatments include beta blockers. Hypertensive end-organ damage includes CAD/MI and CVA.    Review of Systems  Constitutional:  Negative for fatigue and unexpected weight change.  HENT:  Negative for dental problem, mouth sores and trouble swallowing.   Eyes:  Negative for visual disturbance.  Respiratory:  Negative for cough, choking, chest tightness, shortness of breath and wheezing.   Cardiovascular:  Negative for chest pain, palpitations and leg swelling.  Gastrointestinal:  Negative for abdominal distention, abdominal pain, constipation, diarrhea, nausea and vomiting.  Endocrine: Negative for polydipsia, polyphagia and polyuria.  Genitourinary:  Negative for dysuria, flank pain, hematuria and urgency.  Musculoskeletal:  Negative for back pain, gait problem, myalgias and neck pain.  Skin:  Negative for pallor, rash and wound.  Neurological:  Negative for seizures, syncope, speech difficulty, weakness, numbness and headaches.  Psychiatric/Behavioral:  Negative for confusion and dysphoric mood.     Objective:    BP 108/76   Pulse 100   Ht 5\' 9"   (1.753 m)   Wt 175 lb 14.4 oz (79.8 kg)   BMI 25.98 kg/m   Wt Readings from Last 3 Encounters:  10/24/22 175 lb 14.4 oz (79.8 kg)  10/23/22 175 lb 9.6 oz (79.7 kg)  09/17/22 171 lb (77.6 kg)      Results for orders placed or performed in visit on 10/24/22  HgB A1c  Result Value Ref Range   Hemoglobin A1C     HbA1c POC (<> result, manual entry)     HbA1c, POC (prediabetic range)     HbA1c, POC (controlled diabetic range) 9.6 (A) 0.0 - 7.0 %   Diabetic Labs (  most recent): Lab Results  Component Value Date   HGBA1C 9.6 (A) 10/24/2022   HGBA1C 11.2 (A) 07/11/2022   HGBA1C 10.9 (A) 04/02/2022   Lipid Panel     Component Value Date/Time   CHOL 158 07/09/2022 0805   TRIG 304 (H) 07/09/2022 0805   HDL 32 (L) 07/09/2022 0805   CHOLHDL 4.9 07/09/2022 0805   CHOLHDL 7.7 (H) 08/30/2020 0741   VLDL 20 05/11/2020 0224   LDLCALC 77 07/09/2022 0805   LDLCALC 139 (H) 08/30/2020 0741     Assessment & Plan:   1. Type 2 diabetes mellitus with vascular disease (HCC) -His diabetes is  complicated by alarming noncompliance/nonadherence, coronary artery disease and patient remains at a high risk for more acute and chronic complications of diabetes which include CAD, CVA, CKD, retinopathy, and neuropathy. These are all discussed in detail with the patient.  Mr. Grumbles presents with slight improvement in his glycemic profile averaging 221 over the last 14 days.  His AGP shows 26% time in range, 29% level 1 hyperglycemia, 45% level 2 hyperglycemia.  This is considered an improvement from his previous presentation where he did have only 11% time in range.   His point-of-care A1c is 9.6%, improving from 11.2%.   - Suggestion is made for him to avoid simple carbohydrates  from his diet including Cakes, Sweet Desserts, Ice Cream, Soda (diet and regular), Sweet Tea, Candies, Chips, Cookies, Store Bought Juices, Alcohol in Excess of  1-2 drinks a day, Artificial Sweeteners,  Coffee Creamer, and  "Sugar-free" Products, Lemonade. This will help patient to have more stable blood glucose profile and potentially avoid unintended weight gain.  - Patient is advised to stick to a routine mealtimes to eat 3 meals  a day and avoid unnecessary snacks ( to snack only to correct hypoglycemia).  - I have approached patient with the following individualized plan to manage diabetes and patient agrees.  Mr. Roll made some better efforts at this time.  He is urged to stay engaged for better control of his diabetes.   He did not comply with basal/bolus insulin previously tried.  He was switched to premixed insulin for simplicity reasons.  He is not even committed to use the premixed insulin twice daily either.    He promises to do better and engage with a premixed insulin.  He is advised to continue Humalog 75/25 60 units with breakfast and 60 units with supper  when Premeal blood glucose readings are above 90 mg per DL.    -He is advised to continue monitoring blood glucose continuously, at least 4 times a day.    -He is warned not to inject insulin without proper monitoring of blood glucose.  - He is not the right candidate for SGLT2 inhibitors nor incretin therapy.    -Target numbers for A1c, LDL, HDL, Triglycerides,  were discussed in detail.   2) BP/HTN:  -His blood pressure is controlled to target.   He is advised to continue his current medications including beta blockers.  He has marginal blood pressure, he will not tolerate additional ACE inhibitors. The patient was counseled on the dangers of tobacco use, and was advised to quit.  Reviewed strategies to maximize success, including removing cigarettes and smoking materials from environment.  3) Lipids/HPL: His recent labs show LDL of 77.  He is advised to continue Crestor 20 mg milligrams p.o. nightly.  Side effects and precautions discussed with him.  4)  Weight/Diet: His BMI is 25.98-  He  is not a candidate for weight loss.  He is   following with  CDE consult, exercise, and carbohydrates information provided.    5) Chronic Care/Health Maintenance:  -Patient is  Statin medications and encouraged to continue to follow up with Ophthalmology, Podiatrist at least yearly or according to recommendations, and advised to  quit Smoking (tragically he resumed smoking after he quit briefly during his diagnosis of CVA ). I have recommended yearly flu vaccine and pneumonia vaccination at least every 5 years; moderate intensity exercise for up to 150 minutes weekly; and  sleep for at least 7 hours a day.  The patient was counseled on the dangers of tobacco use, and was advised to quit.  Reviewed strategies to maximize success, including removing cigarettes and smoking materials from environment.   He recently had normal ABI, will be repeated in November 2026,  or sooner if needed. I advised patient to maintain close follow up with his PCP for primary care needs.   I spent  26  minutes in the care of the patient today including review of labs from CMP, Lipids, Thyroid Function, Hematology (current and previous including abstractions from other facilities); face-to-face time discussing  his blood glucose readings/logs, discussing hypoglycemia and hyperglycemia episodes and symptoms, medications doses, his options of short and long term treatment based on the latest standards of care / guidelines;  discussion about incorporating lifestyle medicine;  and documenting the encounter. Risk reduction counseling performed per USPSTF guidelines to reduce  cardiovascular risk factors.     Please refer to Patient Instructions for Blood Glucose Monitoring and Insulin/Medications Dosing Guide"  in media tab for additional information. Please  also refer to " Patient Self Inventory" in the Media  tab for reviewed elements of pertinent patient history.  Nicholas [name] participated in the discussions, expressed understanding, and voiced agreement with the  above plans.  All questions were answered to his satisfaction. he is encouraged to contact clinic should he have any questions or concerns prior to his return visit.    Follow up plan: Return in about 4 months (around 02/23/2023) for F/U with Pre-visit Labs, Meter/CGM/Logs, A1c here.  Marquis Lunch, MD Phone: [phone]  Fax: [phone]  This note was partially dictated with voice recognition software. Similar sounding words can be transcribed inadequately or may not  be corrected upon review.  10/24/2022, 8:39 AM

## 2022-10-24 NOTE — Patient Instructions (Signed)

## 2022-10-29 ENCOUNTER — Telehealth (HOSPITAL_COMMUNITY): Payer: Self-pay | Admitting: Emergency Medicine

## 2022-10-29 NOTE — Telephone Encounter (Signed)
Pt returned call. understands prep for lexiscan.

## 2022-10-29 NOTE — Telephone Encounter (Signed)
Left message to return call if he has questions regarding Lexiscan on 10/30/2022.

## 2022-10-30 ENCOUNTER — Ambulatory Visit (HOSPITAL_COMMUNITY)
Admission: RE | Admit: 2022-10-30 | Discharge: 2022-10-30 | Disposition: A | Discharge status: Home / Self Care | Payer: BC Managed Care – PPO | Source: Ambulatory Visit | Attending: Nurse Practitioner | Admitting: Nurse Practitioner

## 2022-10-30 ENCOUNTER — Encounter (HOSPITAL_BASED_OUTPATIENT_CLINIC_OR_DEPARTMENT_OTHER)
Admission: RE | Admit: 2022-10-30 | Discharge: 2022-10-30 | Disposition: A | Discharge status: Home / Self Care | Payer: BC Managed Care – PPO | Source: Ambulatory Visit | Attending: Nurse Practitioner

## 2022-10-30 ENCOUNTER — Encounter (HOSPITAL_COMMUNITY): Payer: Self-pay

## 2022-10-30 DIAGNOSIS — R079 Chest pain, unspecified: Secondary | ICD-10-CM | POA: Insufficient documentation

## 2022-10-30 LAB — NM MYOCAR MULTI W/SPECT W/WALL MOTION / EF
LV dias vol: 175 mL (ref 62–150)
LV sys vol: 165 mL
Nuc Stress EF: 18 %
Peak HR: 118 {beats}/min
RATE: 0.4
Rest HR: 103 {beats}/min
Rest Nuclear Isotope Dose: 10.8 mCi
SDS: 4
SRS: 17
SSS: 21
ST Depression (mm): 0 mm
Stress Nuclear Isotope Dose: 32 mCi
TID: 1.1

## 2022-10-30 MED ORDER — TECHNETIUM TC 99M TETROFOSMIN IV KIT
30.0000 | PACK | Freq: Once | INTRAVENOUS | Status: AC | PRN
  Administered 2022-10-30: 32 via INTRAVENOUS

## 2022-10-30 MED ORDER — REGADENOSON 0.4 MG/5ML IV SOLN
INTRAVENOUS | Status: AC
  Administered 2022-10-30: 0.4 mg via INTRAVENOUS
  Filled 2022-10-30: qty 5

## 2022-10-30 MED ORDER — TECHNETIUM TC 99M TETROFOSMIN IV KIT
10.0000 | PACK | Freq: Once | INTRAVENOUS | Status: AC | PRN
  Administered 2022-10-30: 10.8 via INTRAVENOUS

## 2022-10-30 MED ORDER — SODIUM CHLORIDE FLUSH 0.9 % IV SOLN
INTRAVENOUS | Status: AC
  Administered 2022-10-30: 10 mL via INTRAVENOUS
  Filled 2022-10-30: qty 10

## 2022-11-04 ENCOUNTER — Ambulatory Visit (INDEPENDENT_AMBULATORY_CARE_PROVIDER_SITE_OTHER): Payer: BC Managed Care – PPO

## 2022-11-04 ENCOUNTER — Ambulatory Visit: Payer: BC Managed Care – PPO | Attending: Nurse Practitioner

## 2022-11-04 DIAGNOSIS — M79601 Pain in right arm: Secondary | ICD-10-CM | POA: Diagnosis not present

## 2022-11-04 DIAGNOSIS — R0989 Other specified symptoms and signs involving the circulatory and respiratory systems: Secondary | ICD-10-CM | POA: Diagnosis not present

## 2022-11-04 DIAGNOSIS — M79602 Pain in left arm: Secondary | ICD-10-CM | POA: Diagnosis not present

## 2022-11-04 DIAGNOSIS — R0609 Other forms of dyspnea: Secondary | ICD-10-CM | POA: Diagnosis not present

## 2022-11-04 DIAGNOSIS — R079 Chest pain, unspecified: Secondary | ICD-10-CM

## 2022-11-04 MED ORDER — PERFLUTREN LIPID MICROSPHERE
1.0000 mL | INTRAVENOUS | Status: AC | PRN
  Administered 2022-11-04: 10 mL via INTRAVENOUS

## 2022-11-05 LAB — ECHOCARDIOGRAM COMPLETE
AR max vel: 2.79 cm2
AV Area VTI: 2.4 cm2
AV Area mean vel: 2.49 cm2
AV Mean grad: 4 mm[Hg]
AV Peak grad: 5.2 mm[Hg]
Ao pk vel: 1.14 m/s
Area-P 1/2: 3.81 cm2
Calc EF: 35.1 %
MV VTI: 1.89 cm2
S' Lateral: 4.1 cm
Single Plane A2C EF: 34.4 %
Single Plane A4C EF: 37.8 %

## 2022-11-13 ENCOUNTER — Other Ambulatory Visit: Payer: Self-pay | Admitting: "Endocrinology

## 2022-12-02 ENCOUNTER — Other Ambulatory Visit: Payer: Self-pay | Admitting: Neurology

## 2022-12-02 MED ORDER — NORTRIPTYLINE HCL 10 MG PO CAPS: 40.0000 mg | ORAL_CAPSULE | Freq: Every day | ORAL | 5 refills | Status: DC

## 2022-12-02 NOTE — Progress Notes (Signed)
Patient requests urgent refill for Nortriptyline/CD. He needs to use local [facility], not mail order.

## 2022-12-08 ENCOUNTER — Ambulatory Visit: Payer: BC Managed Care – PPO | Attending: Nurse Practitioner | Admitting: Nurse Practitioner

## 2022-12-08 ENCOUNTER — Encounter: Payer: Self-pay | Admitting: Nurse Practitioner

## 2022-12-08 VITALS — BP 130/80 | HR 105 | Ht 69.0 in | Wt 174.4 lb

## 2022-12-08 DIAGNOSIS — R0609 Other forms of dyspnea: Secondary | ICD-10-CM

## 2022-12-08 DIAGNOSIS — I2510 Atherosclerotic heart disease of native coronary artery without angina pectoris: Secondary | ICD-10-CM

## 2022-12-08 DIAGNOSIS — Z8673 Personal history of transient ischemic attack (TIA), and cerebral infarction without residual deficits: Secondary | ICD-10-CM

## 2022-12-08 DIAGNOSIS — I25119 Atherosclerotic heart disease of native coronary artery with unspecified angina pectoris: Secondary | ICD-10-CM

## 2022-12-08 DIAGNOSIS — M79602 Pain in left arm: Secondary | ICD-10-CM

## 2022-12-08 DIAGNOSIS — M79601 Pain in right arm: Secondary | ICD-10-CM

## 2022-12-08 DIAGNOSIS — Z720 Tobacco use: Secondary | ICD-10-CM

## 2022-12-08 DIAGNOSIS — R000 Tachycardia, unspecified: Secondary | ICD-10-CM

## 2022-12-08 DIAGNOSIS — I5022 Chronic systolic (congestive) heart failure: Secondary | ICD-10-CM

## 2022-12-08 DIAGNOSIS — E785 Hyperlipidemia, unspecified: Secondary | ICD-10-CM

## 2022-12-08 DIAGNOSIS — I255 Ischemic cardiomyopathy: Secondary | ICD-10-CM | POA: Diagnosis not present

## 2022-12-08 DIAGNOSIS — R079 Chest pain, unspecified: Secondary | ICD-10-CM

## 2022-12-08 DIAGNOSIS — E782 Mixed hyperlipidemia: Secondary | ICD-10-CM

## 2022-12-08 DIAGNOSIS — I739 Peripheral vascular disease, unspecified: Secondary | ICD-10-CM

## 2022-12-08 DIAGNOSIS — I10 Essential (primary) hypertension: Secondary | ICD-10-CM

## 2022-12-08 MED ORDER — METOPROLOL SUCCINATE ER 25 MG PO TB24: 25.0000 mg | ORAL_TABLET | Freq: Every day | ORAL | 1 refills | Status: DC

## 2022-12-08 MED ORDER — ENTRESTO 24-26 MG PO TABS: 1.0000 | ORAL_TABLET | Freq: Two times a day (BID) | ORAL | 4 refills | Status: DC

## 2022-12-08 NOTE — Patient Instructions (Addendum)
Medication Instructions:  Your physician has recommended you make the following change in your medication:  Please Increase Metoprolol to 25 mg daily  Please start Entresto 24-26 twice daily   Labwork: In 1 week   Testing/Procedures: None   Follow-Up: Your physician recommends that you schedule a follow-up appointment in: 4-6 weeks   Any Other Special Instructions Will Be Listed Below (If Applicable).  If you need a refill on your cardiac medications before your next appointment, please call your pharmacy.

## 2022-12-08 NOTE — Progress Notes (Unsigned)
Cardiology Office Note:  .   Date: 12/08/2022 ID:  [name], DOB May 05, 1960, MRN [id number] PCP: Assunta Found, MD  [facility] Providers Cardiologist:  Nona Dell, MD    History of Present Illness: .   Nicholas [name] is a 62 y.o. 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  Last seen by Dr. Diona Browner on September 03, 2022.  He was doing well at the time.  10/23/2022 - Today he presents for shortness of breath evaluation.  He states he was recently evaluated by his primary care provider and it was suggested that he follow-up with cardiology due to his symptoms.  Last Saturday night, experienced some difficulty breathing, bilateral arm pain, had to sit down for few hours for symptoms to be relieved.  This past Monday, he admitted to chest heaviness that was a one-time episode.  States his most concerning symptoms are shortness of breath that has been intermittent for the last 2 to 3 months and is now more consistent with bilateral arm pain, occurs with exertion. Denies any palpitations, syncope, presyncope, dizziness, orthopnea, PND, swelling or significant weight changes, acute bleeding, or claudication.  Smokes a couple packs of cigarettes per day, understands he needs to quit smoking.  Drinks " a couple cups of coffee," per day-says around 2 cups/day per his report.   Underwent Lexiscan and echocardiogram last month.  See full report below.  Also carotid duplex was arranged and revealed mild bilateral ICA stenosis at 1 to 39%.  Patient presents today for follow-up.  He continues to note stable shortness of breath that is intermittent.  He reports bilateral arm pain that occurs with exertion.  Denies any chest pain, palpitations, syncope, presyncope, dizziness, orthopnea, PND, swelling or significant weight changes, acute  bleeding, or claudication.   ROS: Negative. See HPI.   Studies Reviewed: Marland Kitchen    EKG: EKG is not ordered today.  Previous EKG dated October 23, 2022 revealed sinus tachycardia, 105 bpm, RBBB, no acute ischemic changes.  Carotid duplex 10/2022: Summary:  Right Carotid: Velocities in the right ICA are consistent with a 1-39%  stenosis. Non-hemodynamically significant plaque <50% noted in the  CCA. The ECA appears <50% stenosed.   Left Carotid: Velocities in the left ICA are consistent with a 1-39%  stenosis.  Non-hemodynamically significant plaque <50% noted in the  CCA. The ECA appears <50% stenosed.   Vertebrals:  Bilateral vertebral arteries demonstrate antegrade flow.  Subclavians: Normal flow hemodynamics were seen in bilateral subclavian arteries.  Echo 10/2022: 1. Left ventricular ejection fraction, by estimation, is 40 to 45%. The  left ventricle has mildly decreased function. The left ventricle  demonstrates global hypokinesis. There is mild left ventricular  hypertrophy. Left ventricular diastolic parameters  are indeterminate.   2. Right ventricular systolic function is normal. The right ventricular  size is normal. Tricuspid regurgitation signal is inadequate for assessing  PA pressure.   3. The mitral valve is normal in structure. No evidence of mitral valve  regurgitation. No evidence of mitral stenosis.   4. The aortic valve is tricuspid. There is mild calcification of the  aortic valve. There is mild thickening of the aortic valve. Aortic valve  regurgitation is not visualized. No aortic stenosis is present.   5. The inferior vena cava is normal in size with greater than 50%  respiratory variability, suggesting  right atrial pressure of 3 mmHg.   Comparison(s): EF 60-65%. Trivial mitral and tricuspid regurgitation.  Lexiscan 10/2022:   Findings are consistent with prior inferior infarction. There is no current ischemia.  The study is high risk. Risk based on  decreased LVEF, consider correlating LVEF with echocardiogram. There is no current myocardium at jeopardy.   No ST deviation was noted.   LV perfusion is abnormal. There is a large moderate to severe intensity inferior wall defect that is fixed and consistent with prior infarct.   Left ventricular function is abnormal. Nuclear stress EF: 18%. The left ventricular ejection fraction is severely decreased (<30%). End diastolic cavity size is severely enlarged.  Echocardiogram 02/2018: 1. The left ventricle has normal systolic function of 60-65%. The cavity  size was normal. There is no increased left ventricular wall thickness.  Echo evidence of normal diastolic relaxation.   2. No evidence of left ventricular regional wall motion abnormalities.   3. The right ventricle has normal systolic function. The cavity was  normal. There is no increase in right ventricular wall thickness. Right  ventricular systolic pressure could not be assessed.   4. The aortic valve is tricuspid. There is moderate calcification of the  aortic valve. There is mild to moderate aortic annular calcification  noted.   5. The mitral valve is normal in structure. There is mild calcification.   6. The tricuspid valve is normal in structure.   7. The aortic root is normal in size and structure.  Lexiscan 06/2014: There was no ST segment deviation noted during stress. Findings consistent with prior inferolatral myocardial infarction with mild peri-infarct ischemia. This is a low risk study. The left ventricular ejection fraction is normal (55-65%).  Physical Exam:   VS:  BP 130/80   Pulse (!) 105   Ht 5\' 9"  (1.753 m)   Wt 174 lb 6.4 oz (79.1 kg)   SpO2 96%   BMI 25.75 kg/m    Wt Readings from Last 3 Encounters:  12/08/22 174 lb 6.4 oz (79.1 kg)  10/24/22 175 lb 14.4 oz (79.8 kg)  10/23/22 175 lb 9.6 oz (79.7 kg)    GEN: Well nourished, well developed in no acute distress NECK: No JVD; No carotid bruits CARDIAC:  S1/S2, fast rate and regular rhythm, no murmurs, rubs, gallops RESPIRATORY:  Clear to auscultation without rales, wheezing or rhonchi  ABDOMEN: Soft, non-tender, non-distended EXTREMITIES:  No edema; No deformity   ASSESSMENT AND PLAN: .    CAD, shortness of breath with exertion Denies any chest pain. Continues to note shortness of breath with exertion intermittently over the past several months, stable over time.  Recent Lexiscan was negative for ischemia, study was determined to be high risk based on decreased LVEF, evidence of prior MI.  Echocardiogram revealed EF 40 to 45%, global hypokinesis of left ventricle, mild LVH. Continue aspirin, rosuvastatin, and nitroglycerin as needed.  Will increase metoprolol succinate to 25 mg daily to medication regimen, as well add Entresto to medication regimen-see below.  Care and ED precautions discussed. Will obtain FLP in 1 week.     2. HFmrEF, ICM Stage C, NYHA class II-III symptoms, stable over time.  Recent Lexiscan was negative for ischemia but showed findings consistent with prior MI.  Study was determined to be high risk due to reduced EF.  Echocardiogram revealed EF 40 to 45%.  Euvolemic and well compensated on exam. Will increase metoprolol succinate 25 mg daily.  Will also start low-dose Entresto at 24/26  mg twice daily.  Will obtain CMET in 1 week. Low sodium diet, fluid restriction <2L, and daily weights encouraged. Educated to contact our office for weight gain of 2 lbs overnight or 5 lbs in one week.  Plan to adjust GDMT slowly over time and repeat echocardiogram in 3 to 6 months after optimal titration of GDMT. Care and ED precautions discussed.  2.  Hypertension, Tachycardia Blood pressure 130/80. Discussed SBP goal < 130. HR 105 on exam, previously felt to be due to caffeine use.  Discussed to wean off caffeine.  Will increase metoprolol succinate to 25 mg daily and also starting low-dose Entresto as mentioned above, no other medication changes  at this time. Discussed to monitor BP at home at least 2 hours after medications and sitting for 5-10 minutes. Heart healthy diet and regular cardiovascular exercise encouraged.   3.  Mixed hyperlipidemia, PAD, bilateral arm pain LDL 77 06/2022.  Carotid duplex revealed mild bilateral ICA stenosis at 1 to 39%, bilateral arm pain felt to be MSK in etiology.  Continue rosuvastatin 40 mg daily.  Heart healthy diet and regular cardiovascular exercise encouraged. Will obtain FLP and CMET as Crestor was increased at last office visit.   4. Hx of CVA/TIA Denies any strokelike symptoms.  Continue current medication regimen. Heart healthy diet and regular cardiovascular exercise encouraged.  ED precautions discussed.  5. Tobacco abuse Smoking cessation encouraged and discussed.  Dispo: Follow-up with me/APP in 4-6 weeks or sooner if anything changes.    Signed, Sharlene Dory, NP

## 2023-01-05 ENCOUNTER — Ambulatory Visit: Payer: BC Managed Care – PPO | Admitting: Nurse Practitioner

## 2023-01-07 ENCOUNTER — Other Ambulatory Visit: Payer: Self-pay | Admitting: Cardiology

## 2023-01-07 ENCOUNTER — Ambulatory Visit: Payer: BC Managed Care – PPO | Attending: Cardiology | Admitting: Cardiology

## 2023-01-07 ENCOUNTER — Encounter: Payer: Self-pay | Admitting: Cardiology

## 2023-01-07 VITALS — BP 110/70 | HR 80 | Ht 69.0 in | Wt 176.8 lb

## 2023-01-07 DIAGNOSIS — I25119 Atherosclerotic heart disease of native coronary artery with unspecified angina pectoris: Secondary | ICD-10-CM | POA: Diagnosis not present

## 2023-01-07 DIAGNOSIS — I200 Unstable angina: Secondary | ICD-10-CM

## 2023-01-07 DIAGNOSIS — E782 Mixed hyperlipidemia: Secondary | ICD-10-CM | POA: Diagnosis not present

## 2023-01-07 DIAGNOSIS — R0602 Shortness of breath: Secondary | ICD-10-CM | POA: Diagnosis not present

## 2023-01-07 DIAGNOSIS — I5020 Unspecified systolic (congestive) heart failure: Secondary | ICD-10-CM | POA: Diagnosis not present

## 2023-01-07 NOTE — Patient Instructions (Signed)
Medication Instructions:  Your physician recommends that you continue on your current medications as directed. Please refer to the Current Medication list given to you today.  *If you need a refill on your cardiac medications before your next appointment, please call your pharmacy*   Lab Work: BMET  CBC  If you have labs (blood work) drawn today and your tests are completely normal, you will receive your results only by: MyChart Message (if you have MyChart) OR A paper copy in the mail If you have any lab test that is abnormal or we need to change your treatment, we will call you to review the results.   Testing/Procedures: Left Heart Cath   Follow-Up: At [facility], you and your health needs are our priority.  As part of our continuing mission to provide you with exceptional heart care, we have created designated Provider Care Teams.  These Care Teams include your primary Cardiologist (physician) and Advanced Practice Providers (APPs -  Physician Assistants and Nurse Practitioners) who all work together to provide you with the care you need, when you need it.  We recommend signing up for the patient portal called "MyChart".  Sign up information is provided on this After Visit Summary.  MyChart is used to connect with patients for Virtual Visits (Telemedicine).  Patients are able to view lab/test results, encounter notes, upcoming appointments, etc.  Non-urgent messages can be sent to your provider as well.   To learn more about what you can do with MyChart, go to [email or web address].    Your next appointment:   1 month(s)  Provider:   You may see Nona Dell, MD or one of the following Advanced Practice Providers on your designated Care Team:   Randall An, PA-C  Jacolyn Reedy, PA-C     Other Instructions  [facility] DEPT OF [facility] [employer] [facility] [address]  [place] [address] Dept:  [phone] Loc: [phone]  Nicholas [name]  01/07/2023  You are scheduled for a Cardiac Catheterization on Tuesday, December 17 with Dr. Tonny Bollman.  1. Please arrive at the [facility] (Main Entrance A) at [facility]: [address] [place], [place] [address] at 10:00 AM (This time is 2 hour(s) before your procedure to ensure your preparation).   Free valet parking service is available. You will check in at ADMITTING. The support person will be asked to wait in the waiting room.  It is OK to have someone drop you off and come back when you are ready to be discharged.    Special note: Every effort is made to have your procedure done on time. Please understand that emergencies sometimes delay scheduled procedures.  2. Diet: Do not eat solid foods after midnight.  The patient may have clear liquids until 5am upon the day of the procedure.  3. Labs: You will need to have blood drawn on- we will call you if you need labs You do not need to be fasting.  4. Medication instructions in preparation for your procedure:   Contrast Allergy: No  Take only 30 units of insulin the night before your procedure. Do not take any insulin on the day of the procedure.   On the morning of your procedure, take your Aspirin 81 mg and any morning medicines NOT listed above.  You may use sips of water.  5. Plan to go home the same day, you will only stay overnight if medically  necessary. 6. Bring a current list of your medications and current insurance cards. 7. You MUST have a responsible person to drive you home. 8. Someone MUST be with you the first 24 hours after you arrive home or your discharge will be delayed. 9. Please wear clothes that are easy to get on and off and wear slip-on shoes.  Thank you for allowing [place] to care for you!   -- [facility]

## 2023-01-07 NOTE — Progress Notes (Signed)
Cardiology Office Note  Date: 01/07/2023   ID: JHALIL RINGSTAFF, DOB 06-Jun-1960, MRN [id number]  History of Present Illness: Nicholas [name] is a 62 y.o. male that I last saw in August at which point he was clinically stable.  He had subsequent visits with Ms. Philis Nettle NP including most recently in November, I reviewed the notes and testing.  He states that since our last encounter he has been experiencing recurring episodes of bilateral shoulder/arm discomfort and shortness of breath, worse with exertion, although also occurring sometimes at rest.  Follow-up Myoview demonstrated a large inferior defect suggestive of scar, LVEF calculated at 18% by that study, however echocardiography indicated LVEF 40 to 45% with global hypokinesis.  Medications have been adjusted over the last few months, however he continues to have symptoms.  He has a history of multivessel CAD status post stent interventions to all three major epicardial vessels as discussed below.  I reviewed his medications.  Current cardiac regimen includes aspirin, Toprol XL, Entresto, and Crestor.  He has as needed nitroglycerin available.  Today we discussed his symptoms and results of interval cardiac testing.  I have recommended a diagnostic cardiac catheterization to evaluate for revascularization options that may need to happen concurrently with his adjustments in GDMT.  We discussed the risks and benefits and he is in agreement to proceed.  Physical Exam: VS:  BP 110/70   Pulse 80   Ht 5\' 9"  (1.753 m)   Wt 176 lb 12.8 oz (80.2 kg)   SpO2 98%   BMI 26.11 kg/m , BMI Body mass index is 26.11 kg/m.  Wt Readings from Last 3 Encounters:  01/07/23 176 lb 12.8 oz (80.2 kg)  12/08/22 174 lb 6.4 oz (79.1 kg)  10/24/22 175 lb 14.4 oz (79.8 kg)    General: Patient appears comfortable at rest. HEENT: Conjunctiva and lids normal. Neck: Supple, no elevated JVP or carotid bruits. Lungs: Clear to auscultation, nonlabored breathing at  rest. Cardiac: Regular rate and rhythm, no S3 or significant systolic murmur, no pericardial rub. Extremities: No pitting edema.  ECG:  An ECG dated 10/23/2022 was personally reviewed today and demonstrated:  Sinus tachycardia with incomplete right bundle branch block.  Labwork: 07/09/2022: ALT 25; AST 17; BUN 17; Creatinine, Ser 0.99; Potassium 4.8; Sodium 138; TSH 2.530     Component Value Date/Time   CHOL 158 07/09/2022 0805   TRIG 304 (H) 07/09/2022 0805   HDL 32 (L) 07/09/2022 0805   CHOLHDL 4.9 07/09/2022 0805   CHOLHDL 7.7 (H) 08/30/2020 0741   VLDL 20 05/11/2020 0224   LDLCALC 77 07/09/2022 0805   LDLCALC 139 (H) 08/30/2020 0741  September 2024: Hemoglobin A1c 9.6%  Other Studies Reviewed Today:  Lexiscan Myoview 10/30/2022:   Findings are consistent with prior inferior infarction. There is no current ischemia.  The study is high risk. Risk based on decreased LVEF, consider correlating LVEF with echocardiogram. There is no current myocardium at jeopardy.   No ST deviation was noted.   LV perfusion is abnormal. There is a large moderate to severe intensity inferior wall defect that is fixed and consistent with prior infarct.   Left ventricular function is abnormal. Nuclear stress EF: 18%. The left ventricular ejection fraction is severely decreased (<30%). End diastolic cavity size is severely enlarged.  Echocardiogram 11/04/2022:  1. Left ventricular ejection fraction, by estimation, is 40 to 45%. The  left ventricle has mildly decreased function. The left ventricle  demonstrates global hypokinesis. There is mild  left ventricular  hypertrophy. Left ventricular diastolic parameters  are indeterminate.   2. Right ventricular systolic function is normal. The right ventricular  size is normal. Tricuspid regurgitation signal is inadequate for assessing  PA pressure.   3. The mitral valve is normal in structure. No evidence of mitral valve  regurgitation. No evidence of mitral  stenosis.   4. The aortic valve is tricuspid. There is mild calcification of the  aortic valve. There is mild thickening of the aortic valve. Aortic valve  regurgitation is not visualized. No aortic stenosis is present.   5. The inferior vena cava is normal in size with greater than 50%  respiratory variability, suggesting right atrial pressure of 3 mmHg.   Carotid Dopplers 11/04/2022: Summary:  Right Carotid: Velocities in the right ICA are consistent with a 1-39%  stenosis.                Non-hemodynamically significant plaque <50% noted in the  CCA. The                 ECA appears <50% stenosed.   Left Carotid: Velocities in the left ICA are consistent with a 1-39%  stenosis.               Non-hemodynamically significant plaque <50% noted in the  CCA. The                ECA appears <50% stenosed.   Vertebrals:  Bilateral vertebral arteries demonstrate antegrade flow.  Subclavians: Normal flow hemodynamics were seen in bilateral subclavian               arteries.   Assessment and Plan:  1.  CAD status post DES to the PLB, BMS to the mid and distal RCA, and DES to the circumflex and distal LAD in 2010.  He reports recurring shoulder/bilateral arm discomfort and shortness of breath concerning for accelerating angina despite adjustments in GDMT.  Interval cardiac testing also shows newly documented HFmrEF with LVEF 40 to 45% and potential large inferior wall scar by Myoview.  We have discussed the risks and benefits of proceeding to a follow-up diagnostic cardiac catheterization to assess for revascularization options and he is in agreement to proceed.   2.  Mixed hyperlipidemia.  LDL 77 in June.  Crestor increased to 40 mg daily.  Disposition:  Follow up  after testing.  Signed, Jonelle Sidle, M.D., F.A.C.C. [facility] at [facility]

## 2023-01-07 NOTE — H&P (View-Only) (Signed)
 Cardiology Office Note  Date: 01/07/2023   ID: JHALIL RINGSTAFF, DOB 06-Jun-1960, MRN [id number]  History of Present Illness: Nicholas [name] is a 62 y.o. male that I last saw in August at which point he was clinically stable.  He had subsequent visits with Ms. Philis Nettle NP including most recently in November, I reviewed the notes and testing.  He states that since our last encounter he has been experiencing recurring episodes of bilateral shoulder/arm discomfort and shortness of breath, worse with exertion, although also occurring sometimes at rest.  Follow-up Myoview demonstrated a large inferior defect suggestive of scar, LVEF calculated at 18% by that study, however echocardiography indicated LVEF 40 to 45% with global hypokinesis.  Medications have been adjusted over the last few months, however he continues to have symptoms.  He has a history of multivessel CAD status post stent interventions to all three major epicardial vessels as discussed below.  I reviewed his medications.  Current cardiac regimen includes aspirin, Toprol XL, Entresto, and Crestor.  He has as needed nitroglycerin available.  Today we discussed his symptoms and results of interval cardiac testing.  I have recommended a diagnostic cardiac catheterization to evaluate for revascularization options that may need to happen concurrently with his adjustments in GDMT.  We discussed the risks and benefits and he is in agreement to proceed.  Physical Exam: VS:  BP 110/70   Pulse 80   Ht 5\' 9"  (1.753 m)   Wt 176 lb 12.8 oz (80.2 kg)   SpO2 98%   BMI 26.11 kg/m , BMI Body mass index is 26.11 kg/m.  Wt Readings from Last 3 Encounters:  01/07/23 176 lb 12.8 oz (80.2 kg)  12/08/22 174 lb 6.4 oz (79.1 kg)  10/24/22 175 lb 14.4 oz (79.8 kg)    General: Patient appears comfortable at rest. HEENT: Conjunctiva and lids normal. Neck: Supple, no elevated JVP or carotid bruits. Lungs: Clear to auscultation, nonlabored breathing at  rest. Cardiac: Regular rate and rhythm, no S3 or significant systolic murmur, no pericardial rub. Extremities: No pitting edema.  ECG:  An ECG dated 10/23/2022 was personally reviewed today and demonstrated:  Sinus tachycardia with incomplete right bundle branch block.  Labwork: 07/09/2022: ALT 25; AST 17; BUN 17; Creatinine, Ser 0.99; Potassium 4.8; Sodium 138; TSH 2.530     Component Value Date/Time   CHOL 158 07/09/2022 0805   TRIG 304 (H) 07/09/2022 0805   HDL 32 (L) 07/09/2022 0805   CHOLHDL 4.9 07/09/2022 0805   CHOLHDL 7.7 (H) 08/30/2020 0741   VLDL 20 05/11/2020 0224   LDLCALC 77 07/09/2022 0805   LDLCALC 139 (H) 08/30/2020 0741  September 2024: Hemoglobin A1c 9.6%  Other Studies Reviewed Today:  Lexiscan Myoview 10/30/2022:   Findings are consistent with prior inferior infarction. There is no current ischemia.  The study is high risk. Risk based on decreased LVEF, consider correlating LVEF with echocardiogram. There is no current myocardium at jeopardy.   No ST deviation was noted.   LV perfusion is abnormal. There is a large moderate to severe intensity inferior wall defect that is fixed and consistent with prior infarct.   Left ventricular function is abnormal. Nuclear stress EF: 18%. The left ventricular ejection fraction is severely decreased (<30%). End diastolic cavity size is severely enlarged.  Echocardiogram 11/04/2022:  1. Left ventricular ejection fraction, by estimation, is 40 to 45%. The  left ventricle has mildly decreased function. The left ventricle  demonstrates global hypokinesis. There is mild  left ventricular  hypertrophy. Left ventricular diastolic parameters  are indeterminate.   2. Right ventricular systolic function is normal. The right ventricular  size is normal. Tricuspid regurgitation signal is inadequate for assessing  PA pressure.   3. The mitral valve is normal in structure. No evidence of mitral valve  regurgitation. No evidence of mitral  stenosis.   4. The aortic valve is tricuspid. There is mild calcification of the  aortic valve. There is mild thickening of the aortic valve. Aortic valve  regurgitation is not visualized. No aortic stenosis is present.   5. The inferior vena cava is normal in size with greater than 50%  respiratory variability, suggesting right atrial pressure of 3 mmHg.   Carotid Dopplers 11/04/2022: Summary:  Right Carotid: Velocities in the right ICA are consistent with a 1-39%  stenosis.                Non-hemodynamically significant plaque <50% noted in the  CCA. The                 ECA appears <50% stenosed.   Left Carotid: Velocities in the left ICA are consistent with a 1-39%  stenosis.               Non-hemodynamically significant plaque <50% noted in the  CCA. The                ECA appears <50% stenosed.   Vertebrals:  Bilateral vertebral arteries demonstrate antegrade flow.  Subclavians: Normal flow hemodynamics were seen in bilateral subclavian               arteries.   Assessment and Plan:  1.  CAD status post DES to the PLB, BMS to the mid and distal RCA, and DES to the circumflex and distal LAD in 2010.  He reports recurring shoulder/bilateral arm discomfort and shortness of breath concerning for accelerating angina despite adjustments in GDMT.  Interval cardiac testing also shows newly documented HFmrEF with LVEF 40 to 45% and potential large inferior wall scar by Myoview.  We have discussed the risks and benefits of proceeding to a follow-up diagnostic cardiac catheterization to assess for revascularization options and he is in agreement to proceed.   2.  Mixed hyperlipidemia.  LDL 77 in June.  Crestor increased to 40 mg daily.  Disposition:  Follow up  after testing.  Signed, Jonelle Sidle, M.D., F.A.C.C. [facility]

## 2023-01-08 ENCOUNTER — Other Ambulatory Visit (HOSPITAL_COMMUNITY)
Admission: RE | Admit: 2023-01-08 | Discharge: 2023-01-08 | Disposition: A | Discharge status: Home / Self Care | Payer: BC Managed Care – PPO | Source: Ambulatory Visit | Attending: Cardiology | Admitting: Cardiology

## 2023-01-08 ENCOUNTER — Other Ambulatory Visit: Payer: Self-pay

## 2023-01-08 ENCOUNTER — Other Ambulatory Visit (HOSPITAL_COMMUNITY): Admission: RE | Admit: 2023-01-08 | Payer: Self-pay | Source: Ambulatory Visit

## 2023-01-08 DIAGNOSIS — Z01818 Encounter for other preprocedural examination: Secondary | ICD-10-CM

## 2023-01-08 LAB — COMPREHENSIVE METABOLIC PANEL
ALT: 22 [IU]/L (ref 0–44)
AST: 15 [IU]/L (ref 0–40)
Albumin: 4 g/dL (ref 3.9–4.9)
Alkaline Phosphatase: 88 [IU]/L (ref 44–121)
BUN/Creatinine Ratio: 14 (ref 10–24)
BUN: 12 mg/dL (ref 8–27)
Bilirubin Total: 0.2 mg/dL (ref 0.0–1.2)
CO2: 22 mmol/L (ref 20–29)
Calcium: 9.2 mg/dL (ref 8.6–10.2)
Chloride: 103 mmol/L (ref 96–106)
Creatinine, Ser: 0.83 mg/dL (ref 0.76–1.27)
Globulin, Total: 2.4 g/dL (ref 1.5–4.5)
Glucose: 269 mg/dL — ABNORMAL HIGH (ref 70–99)
Potassium: 4.7 mmol/L (ref 3.5–5.2)
Sodium: 141 mmol/L (ref 134–144)
Total Protein: 6.4 g/dL (ref 6.0–8.5)
eGFR: 98 mL/min/{1.73_m2} (ref >59)

## 2023-01-08 LAB — LIPID PANEL
Chol/HDL Ratio: 5.4 {ratio} — ABNORMAL HIGH (ref 0.0–5.0)
Cholesterol, Total: 167 mg/dL (ref 100–199)
HDL: 31 mg/dL — ABNORMAL LOW (ref >39)
LDL Chol Calc (NIH): 92 mg/dL (ref 0–99)
Triglycerides: 260 mg/dL — ABNORMAL HIGH (ref 0–149)
VLDL Cholesterol Cal: 44 mg/dL — ABNORMAL HIGH (ref 5–40)

## 2023-01-08 LAB — CBC
HCT: 41.5 % (ref 39.0–52.0)
Hemoglobin: 13.6 g/dL (ref 13.0–17.0)
MCH: 27.1 pg (ref 26.0–34.0)
MCHC: 32.8 g/dL (ref 30.0–36.0)
MCV: 82.7 fL (ref 80.0–100.0)
Platelets: 185 10*3/uL (ref 150–400)
RBC: 5.02 MIL/uL (ref 4.22–5.81)
RDW: 14.7 % (ref 11.5–15.5)
WBC: 9.1 10*3/uL (ref 4.0–10.5)
nRBC: 0 % (ref 0.0–0.2)

## 2023-01-12 ENCOUNTER — Telehealth: Payer: Self-pay | Admitting: *Deleted

## 2023-01-12 NOTE — Telephone Encounter (Signed)
Cardiac Catheterization scheduled at [facility] for: Tuesday January 13, 2023 12 Noon Arrival time [facility] Main Entrance A at: 10 AM  Nothing to eat after midnight prior to procedure, clear liquids until 5 AM day of procedure.  Medication instructions: -Hold:  Insulin-AM of procedure 1/2 usual HS Insulin -Other usual morning medications can be taken with sips of water including aspirin 81 mg.  Plan to go home the same day, you will only stay overnight if medically necessary.  You must have responsible adult to drive you home.  Someone must be with you the first 24 hours after you arrive home.  Reviewed procedure instructions with patient.

## 2023-01-13 ENCOUNTER — Other Ambulatory Visit: Payer: Self-pay

## 2023-01-13 ENCOUNTER — Encounter (HOSPITAL_COMMUNITY)
Admission: RE | Disposition: A | Discharge status: Home / Self Care | Payer: Self-pay | Source: Ambulatory Visit | Attending: Cardiovascular Disease

## 2023-01-13 ENCOUNTER — Ambulatory Visit (HOSPITAL_COMMUNITY)
Admission: RE | Admit: 2023-01-13 | Discharge: 2023-01-13 | Disposition: A | Discharge status: Home / Self Care | Payer: BC Managed Care – PPO | Source: Ambulatory Visit | Attending: Cardiovascular Disease | Admitting: Cardiovascular Disease

## 2023-01-13 DIAGNOSIS — E782 Mixed hyperlipidemia: Secondary | ICD-10-CM | POA: Diagnosis not present

## 2023-01-13 DIAGNOSIS — I25119 Atherosclerotic heart disease of native coronary artery with unspecified angina pectoris: Secondary | ICD-10-CM | POA: Insufficient documentation

## 2023-01-13 DIAGNOSIS — Z7982 Long term (current) use of aspirin: Secondary | ICD-10-CM | POA: Diagnosis not present

## 2023-01-13 DIAGNOSIS — Z955 Presence of coronary angioplasty implant and graft: Secondary | ICD-10-CM | POA: Insufficient documentation

## 2023-01-13 DIAGNOSIS — I2582 Chronic total occlusion of coronary artery: Secondary | ICD-10-CM | POA: Insufficient documentation

## 2023-01-13 DIAGNOSIS — I200 Unstable angina: Secondary | ICD-10-CM

## 2023-01-13 DIAGNOSIS — Z79899 Other long term (current) drug therapy: Secondary | ICD-10-CM | POA: Insufficient documentation

## 2023-01-13 HISTORY — PX: LEFT HEART CATH AND CORONARY ANGIOGRAPHY: CATH118249

## 2023-01-13 LAB — GLUCOSE, CAPILLARY
Glucose-Capillary: 247 mg/dL — ABNORMAL HIGH (ref 70–99)
Glucose-Capillary: 222 mg/dL — ABNORMAL HIGH (ref 70–99)

## 2023-01-13 SURGERY — LEFT HEART CATH AND CORONARY ANGIOGRAPHY
Anesthesia: LOCAL

## 2023-01-13 MED ORDER — MIDAZOLAM HCL 2 MG/2ML IJ SOLN
INTRAMUSCULAR | Status: DC | PRN
  Administered 2023-01-13: 2 mg via INTRAVENOUS
  Administered 2023-01-13: 1 mg via INTRAVENOUS

## 2023-01-13 MED ORDER — FENTANYL CITRATE (PF) 100 MCG/2ML IJ SOLN
INTRAMUSCULAR | Status: DC | PRN
  Administered 2023-01-13 (×2): 25 ug via INTRAVENOUS

## 2023-01-13 MED ORDER — HYDRALAZINE HCL 20 MG/ML IJ SOLN: 10.0000 mg | INTRAMUSCULAR | Status: DC | PRN

## 2023-01-13 MED ORDER — SODIUM CHLORIDE 0.9 % WEIGHT BASED INFUSION
3.0000 mL/kg/h | INTRAVENOUS | Status: AC
  Administered 2023-01-13: 3 mL/kg/h via INTRAVENOUS

## 2023-01-13 MED ORDER — VERAPAMIL HCL 2.5 MG/ML IV SOLN
INTRAVENOUS | Status: AC
  Filled 2023-01-13: qty 2

## 2023-01-13 MED ORDER — SODIUM CHLORIDE 0.9 % IV SOLN
INTRAVENOUS | Status: DC
Start: 2023-01-13 — End: 2023-01-13

## 2023-01-13 MED ORDER — ONDANSETRON HCL 4 MG/2ML IJ SOLN
4.0000 mg | Freq: Four times a day (QID) | INTRAMUSCULAR | Status: DC | PRN
Start: 2023-01-13 — End: 2023-01-13

## 2023-01-13 MED ORDER — HEPARIN (PORCINE) IN NACL 1000-0.9 UT/500ML-% IV SOLN
INTRAVENOUS | Status: DC | PRN
  Administered 2023-01-13 (×2): 500 mL

## 2023-01-13 MED ORDER — SODIUM CHLORIDE 0.9 % IV SOLN: 250.0000 mL | INTRAVENOUS | Status: DC | PRN

## 2023-01-13 MED ORDER — IOHEXOL 350 MG/ML SOLN
INTRAVENOUS | Status: DC | PRN
  Administered 2023-01-13: 53 mL

## 2023-01-13 MED ORDER — FENTANYL CITRATE (PF) 100 MCG/2ML IJ SOLN
INTRAMUSCULAR | Status: AC
  Filled 2023-01-13: qty 2

## 2023-01-13 MED ORDER — SODIUM CHLORIDE 0.9% FLUSH: 3.0000 mL | INTRAVENOUS | Status: DC | PRN

## 2023-01-13 MED ORDER — SODIUM CHLORIDE 0.9% FLUSH: 3.0000 mL | Freq: Two times a day (BID) | INTRAVENOUS | Status: DC

## 2023-01-13 MED ORDER — HEPARIN SODIUM (PORCINE) 1000 UNIT/ML IJ SOLN
INTRAMUSCULAR | Status: DC | PRN
  Administered 2023-01-13: 4000 [IU] via INTRAVENOUS

## 2023-01-13 MED ORDER — SODIUM CHLORIDE 0.9 % WEIGHT BASED INFUSION: 1.0000 mL/kg/h | INTRAVENOUS | Status: DC

## 2023-01-13 MED ORDER — HEPARIN SODIUM (PORCINE) 1000 UNIT/ML IJ SOLN
INTRAMUSCULAR | Status: AC
  Filled 2023-01-13: qty 10

## 2023-01-13 MED ORDER — ASPIRIN 81 MG PO CHEW: 81.0000 mg | CHEWABLE_TABLET | ORAL | Status: DC

## 2023-01-13 MED ORDER — ACETAMINOPHEN 325 MG PO TABS: 650.0000 mg | ORAL_TABLET | ORAL | Status: DC | PRN

## 2023-01-13 MED ORDER — MIDAZOLAM HCL 2 MG/2ML IJ SOLN
INTRAMUSCULAR | Status: AC
  Filled 2023-01-13: qty 2

## 2023-01-13 MED ORDER — VERAPAMIL HCL 2.5 MG/ML IV SOLN
INTRAVENOUS | Status: DC | PRN
  Administered 2023-01-13: 10 mL via INTRA_ARTERIAL

## 2023-01-13 MED ORDER — LABETALOL HCL 5 MG/ML IV SOLN: 10.0000 mg | INTRAVENOUS | Status: DC | PRN

## 2023-01-13 MED ORDER — LIDOCAINE HCL (PF) 1 % IJ SOLN
INTRAMUSCULAR | Status: DC | PRN
  Administered 2023-01-13: 2 mL

## 2023-01-13 SURGICAL SUPPLY — 9 items
CATH 5FR JL3.5 JR4 ANG PIG MP (CATHETERS) IMPLANT
CATH INFINITI 5 FR AR1 MOD (CATHETERS) IMPLANT
CATH INFINITI 5FR AL1 (CATHETERS) IMPLANT
DEVICE RAD COMP TR BAND LRG (VASCULAR PRODUCTS) IMPLANT
GLIDESHEATH SLEND SS 6F .021 (SHEATH) IMPLANT
GUIDEWIRE INQWIRE 1.5J.035X260 (WIRE) IMPLANT
INQWIRE 1.5J .035X260CM (WIRE) ×1
PACK CARDIAC CATHETERIZATION (CUSTOM PROCEDURE TRAY) ×1 IMPLANT
SET ATX-X65L (MISCELLANEOUS) IMPLANT

## 2023-01-13 NOTE — Interval H&P Note (Signed)
History and Physical Interval Note:  01/13/2023 11:51 AM  Nicholas [name]  has presented today for surgery, with the diagnosis of angina.  The various methods of treatment have been discussed with the patient and family. After consideration of risks, benefits and other options for treatment, the patient has consented to  Procedure(s): LEFT HEART CATH AND CORONARY ANGIOGRAPHY (N/A) as a surgical intervention.  The patient's history has been reviewed, patient examined, no change in status, stable for surgery.  I have reviewed the patient's chart and labs.  Questions were answered to the patient's satisfaction.     Tonny Bollman

## 2023-01-14 ENCOUNTER — Encounter (HOSPITAL_COMMUNITY): Payer: Self-pay | Admitting: Cardiovascular Disease

## 2023-01-22 ENCOUNTER — Encounter: Payer: BC Managed Care – PPO | Admitting: Surgery

## 2023-01-27 ENCOUNTER — Other Ambulatory Visit: Payer: Self-pay | Admitting: *Deleted

## 2023-01-27 ENCOUNTER — Institutional Professional Consult (permissible substitution): Payer: BC Managed Care – PPO | Admitting: Surgery

## 2023-01-27 ENCOUNTER — Encounter: Payer: Self-pay | Admitting: Surgery

## 2023-01-27 VITALS — BP 121/78 | HR 100 | Resp 20 | Ht 69.0 in | Wt 175.0 lb

## 2023-01-27 DIAGNOSIS — I2510 Atherosclerotic heart disease of native coronary artery without angina pectoris: Secondary | ICD-10-CM

## 2023-01-28 NOTE — Progress Notes (Signed)
 Cardiothoracic Surgery Consultation  PCP is Marvine Rush, MD Referring Provider is Wonda Sharper, MD  Chief Complaint  Patient presents with   Coronary Artery Disease    Surgical consult/ ECHO 11/04/22/ Cardiac Cath 01/13/23    HPI:  The patient is a 63 year old gentleman with a history of type 2 diabetes with diabetic peripheral neuropathy, hyperlipidemia, history of heavy smoking with ongoing 1/2 pack/day smoking, prior stroke in 2018, and coronary disease status post DES to the PLB, BMS to the mid and distal RCA, and DES to the left circumflex and distal LAD in 2010.  He has been followed by Dr. Debera and was seen in August at which time he was clinically stable.  He is now having more frequent episodes of substernal chest discomfort radiating into his shoulders and down his arms associated with shortness of breath.  These have been occurring mostly with exertion but he has had episodes at rest.  He had a 2D echocardiogram on 11/04/2022 showing a reduction in his ejection fraction to 40 to 45% with global hypokinesis.  His LV diastolic diameter was 5.2 cm.  There is no significant valvular abnormality.  His ejection fraction by echocardiogram in February 2020 was 60 to 65%.  He had a stress nuclear scan on 10/30/2022 that showed prior inferior infarction with no current ischemia.  His left ventricular ejection fraction was noted to be 18% with marked enlargement of the LV cavity at end diastole.  I am not sure why this is so much different than the follow-up echocardiogram unless he was severely ischemic during that test.  He is here today with his wife.  He continues to work and has his own [employer].  He continues to smoke about 1/2 pack of cigarettes per day but knows he needs to quit.  He used to smoke 2 packs/day.  Past Medical History:  Diagnosis Date   Anal lesion    Anal papilla/tags - external lesion   Bilateral carpal tunnel syndrome 11/24/2018   Colon polyp     Hyperplastic rectosigmoid polyp 9/08   Coronary atherosclerosis of native coronary artery    DES to PLB, BMS to mid and distal RCA, DES to circumflex and distal LAD, 6/10 (5 stents)   Diabetic peripheral neuropathy (HCC) 11/24/2018   GERD (gastroesophageal reflux disease)    Helicobacter pylori (H. pylori) 09/2009   Treated with helidac   Hiatal hernia    Hyperplastic colon polyp 2008   Mixed hyperlipidemia    Myocardial infarction (HCC) 06/2008   Seizures (HCC)    Stroke [facility]    January 2018   Type 2 diabetes mellitus [facility])     Past Surgical History:  Procedure Laterality Date   CHOLECYSTECTOMY     COLLAPSED LUNG     15 years ago   COLONOSCOPY  10/12/2006   Dr. Shaaron- rectosigmoid polyp s/phot snare polypectomy o/w normal rectum and terminal ileum. hyperplastic polyp on bx   COLONOSCOPY WITH PROPOFOL  N/A 09/01/2019   Procedure: COLONOSCOPY WITH PROPOFOL ;  Surgeon: Shaaron Lamar HERO, MD;  Location: [facility] ENDO SUITE;  Service: Endoscopy;  Laterality: N/A;  8:30am   EP IMPLANTABLE DEVICE N/A 02/12/2016   Procedure: Loop Recorder Insertion;  Surgeon: Lynwood Rakers, MD;  Location: [facility] INVASIVE CV LAB;  Service: Cardiovascular;  Laterality: N/A;   ESOPHAGOGASTRODUODENOSCOPY  10/12/2006   Dr. Shaaron- examination of the tubular esophagus revealed no mucosal abnormalities. the EG junction was easily traversed. small hiatal hernia, the gastric mucosa o/w appeared normal. there  was no infiltrating process or frank ulcer seen.   implantable loop recorder removal  09/29/2019   MDT Reveal LINQ removed in office by Dr Kelsie   LEFT HEART CATH AND CORONARY ANGIOGRAPHY N/A 01/13/2023   Procedure: LEFT HEART CATH AND CORONARY ANGIOGRAPHY;  Surgeon: Wonda Sharper, MD;  Location: [facility] INVASIVE CV LAB;  Service: Cardiovascular;  Laterality: N/A;   TEE WITHOUT CARDIOVERSION N/A 02/12/2016   Procedure: TRANSESOPHAGEAL ECHOCARDIOGRAM (TEE);  Surgeon: Oneil JAYSON Parchment, MD;  Location: [facility] ENDOSCOPY;  Service:  Cardiovascular;  Laterality: N/A;   WIRE IN APEX OF RIGHT LUNG     Since childhood    Family History  Problem Relation Age of Onset   Diabetes Mother    Heart disease Father    Cancer Brother    CVA Neg Hx     Social History Social History   Tobacco Use   Smoking status: Every Day    Current packs/day: 0.50    Average packs/day: 0.5 packs/day for 49.8 years (24.9 ttl pk-yrs)    Types: Cigarettes    Start date: 04/08/1973   Smokeless tobacco: Never  Vaping Use   Vaping status: Never Used  Substance Use Topics   Alcohol use: No    Alcohol/week: 0.0 standard drinks of alcohol   Drug use: No    Current Outpatient Medications  Medication Sig Dispense Refill   acetaminophen  (TYLENOL ) 500 MG tablet Take 500 mg by mouth every 6 (six) hours as needed (for headaches).     albuterol  (VENTOLIN  HFA) 108 (90 Base) MCG/ACT inhaler Inhale 1-2 puffs into the lungs every 6 (six) hours as needed for wheezing or shortness of breath.     aspirin  EC 81 MG EC tablet Take 1 tablet (81 mg total) by mouth daily. Swallow whole. 30 tablet 11   BD PEN NEEDLE NANO U/F 32G X 4 MM MISC USE AS DIRECTED FOUR TIMES DAILY. 100 each 2   Coenzyme Q10 (COQ10) 100 MG CAPS Take 100 mg by mouth daily.     Continuous Glucose Receiver (FREESTYLE LIBRE 3 READER) DEVI 1 Piece by Does not apply route once as needed for up to 1 dose. 1 each 0   Continuous Glucose Sensor (FREESTYLE LIBRE 2 SENSOR) MISC APPLY 1 PATCH TO SKIN ONCE EVERY 14 DAYS 2 each 2   gabapentin  (NEURONTIN ) 100 MG capsule Take 100 mg by mouth daily.      Insulin  Lispro Prot & Lispro (HUMALOG  MIX 75/25 KWIKPEN) (75-25) 100 UNIT/ML Kwikpen Inject 60 Units into the skin 2 (two) times daily with a meal. (Patient taking differently: Inject 60 Units into the skin See admin instructions. 60 units at bedtime, but checks blood sugar throughout the day for as needed) 30 mL 2   metoprolol  succinate (TOPROL -XL) 25 MG 24 hr tablet Take 1 tablet (25 mg total) by mouth  daily. 90 tablet 1   nitroGLYCERIN  (NITROSTAT ) 0.4 MG SL tablet DISSOLVE ONE TABLET UNDER THE TONGUE EVERY 5 MINUTES AS NEEDED FOR CHEST PAIN.  DO NOT EXCEED A TOTAL OF 3 DOSES IN 15 MINUTES 25 tablet 3   nortriptyline  (PAMELOR ) 10 MG capsule Take 4 capsules (40 mg total) by mouth at bedtime. 120 capsule 5   pantoprazole  (PROTONIX ) 40 MG tablet Take 40 mg by mouth daily before breakfast.     rosuvastatin  (CRESTOR ) 40 MG tablet Take 40 mg by mouth at bedtime.     sacubitril-valsartan (ENTRESTO ) 24-26 MG Take 1 tablet by mouth 2 (two) times daily. 60 tablet 4  traMADol  (ULTRAM ) 50 MG tablet Take 1 tablet (50 mg total) by mouth every 6 (six) hours as needed for moderate pain. (Patient taking differently: Take 50 mg by mouth daily.) 15 tablet 0   Ubrogepant  (UBRELVY ) 100 MG TABS Take 1 tablet (100 mg total) by mouth daily as needed. Take one tablet at onset of headache, may repeat 1 tablet in 2 hours, no more than 2 tablets in 24 hours 10 tablet 11   No current facility-administered medications for this visit.    Allergies  Allergen Reactions   Keppra  [Levetiracetam ]     Hallucination   Topamax  [Topiramate ]     irritable    Review of Systems  Constitutional:  Positive for activity change and fatigue.  HENT: Negative.    Eyes: Negative.   Respiratory:  Positive for shortness of breath.   Cardiovascular:  Positive for chest pain. Negative for leg swelling.  Gastrointestinal: Negative.   Endocrine: Negative.   Genitourinary: Negative.   Musculoskeletal: Negative.   Skin: Negative.   Allergic/Immunologic: Negative.   Neurological:  Negative for dizziness and syncope.       Peripheral neuropathy  Hematological: Negative.   Psychiatric/Behavioral: Negative.      BP 121/78 (BP Location: Left Arm, Patient Position: Sitting, Cuff Size: Normal)   Pulse 100   Resp 20   Ht 5' 9 (1.753 m)   Wt 175 lb (79.4 kg)   SpO2 93% Comment: RA  BMI 25.84 kg/m  Physical Exam Constitutional:       Appearance: Normal appearance. He is normal weight.  HENT:     Head: Normocephalic and atraumatic.  Eyes:     Extraocular Movements: Extraocular movements intact.     Conjunctiva/sclera: Conjunctivae normal.     Pupils: Pupils are equal, round, and reactive to light.  Neck:     Vascular: No carotid bruit.  Cardiovascular:     Rate and Rhythm: Normal rate and regular rhythm.     Pulses: Normal pulses.     Heart sounds: Normal heart sounds. No murmur heard. Pulmonary:     Effort: Pulmonary effort is normal.     Breath sounds: Normal breath sounds.  Abdominal:     General: There is no distension.     Tenderness: There is no abdominal tenderness.  Musculoskeletal:        General: No swelling. Normal range of motion.     Cervical back: Normal range of motion and neck supple.  Skin:    General: Skin is warm and dry.  Neurological:     General: No focal deficit present.     Mental Status: He is alert and oriented to person, place, and time.  Psychiatric:        Mood and Affect: Mood normal.        Behavior: Behavior normal.      Diagnostic Tests:  ECHOCARDIOGRAM REPORT       Patient Name:   Nicholas [name] Date of Exam: 11/04/2022  Medical Rec #:  [id number]      Height:       69.0 in  Accession #:    [id number]     Weight:       175.9 lb  Date of Birth:  06-13-1960      BSA:          1.956 m  Patient Age:    62 years       BP:           108/76 mmHg  Patient Gender: M              HR:           109 bpm.  Exam Location:  [place]   Procedure: 2D Echo, Cardiac Doppler, Color Doppler and Intracardiac             Opacification Agent   Indications:    R06.9 DOE    History:        Patient has prior history of Echocardiogram examinations,  most                 recent 03/04/2018. Previous Myocardial Infarction and CAD,  Stroke                 and TIA; Risk Factors:Diabetes, Hypertension, Dyslipidemia  and                 Current Smoker.    Sonographer:    Bascom Burows RCS, RVS   Referring Phys: [phone] [facility]     Sonographer Comments: Global longitudinal strain was attempted.  IMPRESSIONS     1. Left ventricular ejection fraction, by estimation, is 40 to 45%. The  left ventricle has mildly decreased function. The left ventricle  demonstrates global hypokinesis. There is mild left ventricular  hypertrophy. Left ventricular diastolic parameters  are indeterminate.   2. Right ventricular systolic function is normal. The right ventricular  size is normal. Tricuspid regurgitation signal is inadequate for assessing  PA pressure.   3. The mitral valve is normal in structure. No evidence of mitral valve  regurgitation. No evidence of mitral stenosis.   4. The aortic valve is tricuspid. There is mild calcification of the  aortic valve. There is mild thickening of the aortic valve. Aortic valve  regurgitation is not visualized. No aortic stenosis is present.   5. The inferior vena cava is normal in size with greater than 50%  respiratory variability, suggesting right atrial pressure of 3 mmHg.   Comparison(s): EF 60-65%. Trivial mitral and tricuspid regurgitation.   FINDINGS   Left Ventricle: Left ventricular ejection fraction, by estimation, is 40  to 45%. The left ventricle has mildly decreased function. The left  ventricle demonstrates global hypokinesis. Definity  contrast agent was  given IV to delineate the left ventricular   endocardial borders. Global longitudinal strain performed but not  reported based on interpreter judgement due to suboptimal tracking. The  left ventricular internal cavity size was normal in size. There is mild  left ventricular hypertrophy. Left  ventricular diastolic parameters are indeterminate.   Right Ventricle: The right ventricular size is normal. Right vetricular  wall thickness was not well visualized. Right ventricular systolic  function is normal. Tricuspid regurgitation signal is inadequate for  assessing PA  pressure.   Left Atrium: Left atrial size was normal in size.   Right Atrium: Right atrial size was normal in size.   Pericardium: There is no evidence of pericardial effusion.   Mitral Valve: The mitral valve is normal in structure. No evidence of  mitral valve regurgitation. No evidence of mitral valve stenosis. MV peak  gradient, 9.4 mmHg. The mean mitral valve gradient is 3.0 mmHg.   Tricuspid Valve: The tricuspid valve is normal in structure. Tricuspid  valve regurgitation is not demonstrated. No evidence of tricuspid  stenosis.   Aortic Valve: The aortic valve is tricuspid. There is mild calcification  of the aortic valve. There is mild thickening of the aortic valve. There  is mild aortic  valve annular calcification. Aortic valve regurgitation is  not visualized. No aortic stenosis   is present. Aortic valve mean gradient measures 4.0 mmHg. Aortic valve  peak gradient measures 5.2 mmHg. Aortic valve area, by VTI measures 2.40  cm.   Pulmonic Valve: The pulmonic valve was not well visualized. Pulmonic valve  regurgitation is not visualized. No evidence of pulmonic stenosis.   Aorta: The aortic root and ascending aorta are structurally normal, with  no evidence of dilitation.   Venous: The inferior vena cava is normal in size with greater than 50%  respiratory variability, suggesting right atrial pressure of 3 mmHg.   IAS/Shunts: No atrial level shunt detected by color flow Doppler.     LEFT VENTRICLE  PLAX 2D  LVIDd:         5.20 cm      Diastology  LVIDs:         4.10 cm      LV e' medial:    5.98 cm/s  LV PW:         1.10 cm      LV E/e' medial:  12.6  LV IVS:        1.10 cm      LV e' lateral:   6.34 cm/s  LVOT diam:     2.30 cm      LV E/e' lateral: 11.9  LV SV:         59  LV SV Index:   30  LVOT Area:     4.15 cm    LV Volumes (MOD)  LV vol d, MOD A2C: 103.0 ml  LV vol d, MOD A4C: 129.0 ml  LV vol s, MOD A2C: 67.6 ml  LV vol s, MOD A4C: 80.3 ml  LV SV  MOD A2C:     35.4 ml  LV SV MOD A4C:     129.0 ml  LV SV MOD BP:      40.6 ml   RIGHT VENTRICLE  RV Basal diam:  2.20 cm  RV Mid diam:    2.60 cm  RV S prime:     15.30 cm/s  TAPSE (M-mode): 1.8 cm   LEFT ATRIUM             Index        RIGHT ATRIUM          Index  LA diam:        3.30 cm 1.69 cm/m   RA Area:     9.75 cm  LA Vol (A2C):   36.8 ml 18.81 ml/m  RA Volume:   16.30 ml 8.33 ml/m  LA Vol (A4C):   30.2 ml 15.44 ml/m  LA Biplane Vol: 36.8 ml 18.81 ml/m   AORTIC VALVE                    PULMONIC VALVE  AV Area (Vmax):    2.79 cm     PV Vmax:       0.96 m/s  AV Area (Vmean):   2.49 cm     PV Peak grad:  3.7 mmHg  AV Area (VTI):     2.40 cm  AV Vmax:           114.00 cm/s  AV Vmean:          90.200 cm/s  AV VTI:            0.244 m  AV Peak Grad:      5.2  mmHg  AV Mean Grad:      4.0 mmHg  LVOT Vmax:         76.50 cm/s  LVOT Vmean:        54.100 cm/s  LVOT VTI:          0.141 m  LVOT/AV VTI ratio: 0.58    AORTA  Ao Root diam: 3.80 cm  Ao Asc diam:  3.40 cm   MITRAL VALVE  MV Area (PHT): 3.81 cm     SHUNTS  MV Area VTI:   1.89 cm     Systemic VTI:  0.14 m  MV Peak grad:  9.4 mmHg     Systemic Diam: 2.30 cm  MV Mean grad:  3.0 mmHg  MV Vmax:       1.53 m/s  MV Vmean:      81.0 cm/s  MV Decel Time: 199 msec  MV E velocity: 75.30 cm/s  MV A velocity: 126.00 cm/s  MV E/A ratio:  0.60   Dorn Ross MD  Electronically signed by Dorn Ross MD  Signature Date/Time: 11/05/2022/9:30:13 AM        Final      Impression:  This 63 year old gentleman has severe multivessel coronary disease with progressive anginal symptoms.  I agree that coronary artery bypass graft surgery is the best treatment to improve his symptoms and prevent further left ventricular dysfunction.  I think his LAD, OM1, and posterolateral branch of the RCA appear graftable.  I think one or both of his diagonal branches are probably graftable also and have high-grade ostial stenoses.   I reviewed the cardiac catheterization images with the patient and his wife and answered all their questions. I discussed the operative procedure with the patient and his wife including alternatives, benefits and risks; including but not limited to bleeding, blood transfusion, infection, stroke, myocardial infarction, graft failure, heart block requiring a permanent pacemaker, organ dysfunction, and death.  Helayne JINNY Hazel understands and agrees to proceed.    Plan:  He will be scheduled for coronary bypass graft surgery on Monday 02/09/2023.  I spent 60 minutes performing this consultation and > 50% of this time was spent face to face counseling and coordinating the care of this patient's severe multivessel coronary disease.  Dorise MARLA Fellers, MD [facility] [phone]

## 2023-01-29 ENCOUNTER — Encounter: Payer: Self-pay | Admitting: *Deleted

## 2023-01-29 NOTE — Progress Notes (Signed)
 Attempted to reach patient regarding follow-up LDCT. Unable to reach patient directly. Detailed VM left asking that the patient return my call.

## 2023-02-04 NOTE — Pre-Procedure Instructions (Signed)
 Surgical Instructions   Your procedure is scheduled on February 09, 2023. Report to [facility] Main Entrance A at 5:30 A.M., then check in with the Admitting office. Any questions or running late day of surgery: call [phone]  Questions prior to your surgery date: call [phone], Monday-Friday, 8am-4pm. If you experience any cold or flu symptoms such as cough, fever, chills, shortness of breath, etc. between now and your scheduled surgery, please notify us  at the above number.     Remember:  Do not eat or drink after midnight the night before your surgery    Take these medicines the morning of surgery with A SIP OF WATER : gabapentin  (NEURONTIN )  metoprolol  succinate (TOPROL -XL)  pantoprazole  (PROTONIX )  traMADol  (ULTRAM )    May take these medicines IF NEEDED: acetaminophen  (TYLENOL )  albuterol  (VENTOLIN  HFA) inhaler - please bring inhaler with you morning of surgery nitroGLYCERIN  (NITROSTAT ) - if dose taken prior to surgery, please call either of the above phone numbers Ubrogepant  (UBRELVY )    Continue taking your Aspirin  through the day before surgery. DO NOT take any the morning of surgery.   One week prior to surgery, STOP taking any Aleve, Naproxen, Ibuprofen, Motrin, Advil, Goody's, BC's, all herbal medications, fish oil, and non-prescription vitamins.   WHAT DO I DO ABOUT MY DIABETES MEDICATION?   THE NIGHT BEFORE SURGERY, take 42 units of Insulin  Lispro Prot & Lispro (HUMALOG  MIX 75/25 KWIKPEN).       THE MORNING OF SURGERY, DO NOT take any Insulin  Lispro Prot & Lispro (HUMALOG  MIX 75/25 KWIKPEN).   HOW TO MANAGE YOUR DIABETES BEFORE AND AFTER SURGERY  Why is it important to control my blood sugar before and after surgery? Improving blood sugar levels before and after surgery helps healing and can limit problems. A way of improving blood sugar control is eating a healthy diet by:  Eating less sugar and carbohydrates  Increasing activity/exercise   Talking with your doctor about reaching your blood sugar goals High blood sugars (greater than 180 mg/dL) can raise your risk of infections and slow your recovery, so you will need to focus on controlling your diabetes during the weeks before surgery. Make sure that the doctor who takes care of your diabetes knows about your planned surgery including the date and location.  How do I manage my blood sugar before surgery? Check your blood sugar at least 4 times a day, starting 2 days before surgery, to make sure that the level is not too high or low.  Check your blood sugar the morning of your surgery when you wake up and every 2 hours until you get to the Short Stay unit.  If your blood sugar is less than 70 mg/dL, you will need to treat for low blood sugar: Do not take insulin . Treat a low blood sugar (less than 70 mg/dL) with  cup of clear juice (cranberry or apple), 4 glucose tablets, OR glucose gel. Recheck blood sugar in 15 minutes after treatment (to make sure it is greater than 70 mg/dL). If your blood sugar is not greater than 70 mg/dL on recheck, call [phone] for further instructions. Report your blood sugar to the short stay nurse when you get to Short Stay.  If you are admitted to the hospital after surgery: Your blood sugar will be checked by the staff and you will probably be given insulin  after surgery (instead of oral diabetes medicines) to make sure you have good blood sugar levels. The goal for blood sugar control after  surgery is 80-180 mg/dL.                      Do NOT Smoke (Tobacco/Vaping) for 24 hours prior to your procedure.  If you use a CPAP at night, you may bring your mask/headgear for your overnight stay.   You will be asked to remove any contacts, glasses, piercing's, hearing aid's, dentures/partials prior to surgery. Please bring cases for these items if needed.    Patients discharged the day of surgery will not be allowed to drive home, and someone  needs to stay with them for 24 hours.  SURGICAL WAITING ROOM VISITATION Patients may have no more than 2 support people in the waiting area - these visitors may rotate.   Pre-op nurse will coordinate an appropriate time for 1 ADULT support person, who may not rotate, to accompany patient in pre-op.  Children under the age of 9 must have an adult with them who is not the patient and must remain in the main waiting area with an adult.  If the patient needs to stay at the hospital during part of their recovery, the visitor guidelines for inpatient rooms apply.  Please refer to the [facility] website for the visitor guidelines for any additional information.   If you received a COVID test during your pre-op visit  it is requested that you wear a mask when out in public, stay away from anyone that may not be feeling well and notify your surgeon if you develop symptoms. If you have been in contact with anyone that has tested positive in the last 10 days please notify you surgeon.      Pre-operative CHG Bathing Instructions   You can play a key role in reducing the risk of infection after surgery. Your skin needs to be as free of germs as possible. You can reduce the number of germs on your skin by washing with CHG (chlorhexidine  gluconate) soap before surgery. CHG is an antiseptic soap that kills germs and continues to kill germs even after washing.   DO NOT use if you have an allergy to chlorhexidine /CHG or antibacterial soaps. If your skin becomes reddened or irritated, stop using the CHG and notify one of our RNs at [phone].              TAKE A SHOWER THE NIGHT BEFORE SURGERY AND THE DAY OF SURGERY    Please keep in mind the following:  DO NOT shave, including legs and underarms, 48 hours prior to surgery.   You may shave your face before/day of surgery.  Place clean sheets on your bed the night before surgery Use a clean washcloth (not used since being washed) for each shower. DO  NOT sleep with pet's night before surgery.  CHG Shower Instructions:  Wash your face and private area with normal soap. If you choose to wash your hair, wash first with your normal shampoo.  After you use shampoo/soap, rinse your hair and body thoroughly to remove shampoo/soap residue.  Turn the water  OFF and apply half the bottle of CHG soap to a CLEAN washcloth.  Apply CHG soap ONLY FROM YOUR NECK DOWN TO YOUR TOES (washing for 3-5 minutes)  DO NOT use CHG soap on face, private areas, open wounds, or sores.  Pay special attention to the area where your surgery is being performed.  If you are having back surgery, having someone wash your back for you may be helpful. Wait 2 minutes after  CHG soap is applied, then you may rinse off the CHG soap.  Pat dry with a clean towel  Put on clean pajamas    Additional instructions for the day of surgery: DO NOT APPLY any lotions, deodorants, cologne, or perfumes.   Do not wear jewelry or makeup Do not wear nail polish, gel polish, artificial nails, or any other type of covering on natural nails (fingers and toes) Do not bring valuables to the hospital. [facility] is not responsible for valuables/personal belongings. Put on clean/comfortable clothes.  Please brush your teeth.  Ask your nurse before applying any prescription medications to the skin.

## 2023-02-05 ENCOUNTER — Encounter (HOSPITAL_COMMUNITY): Payer: Self-pay

## 2023-02-05 ENCOUNTER — Ambulatory Visit (HOSPITAL_COMMUNITY)
Admission: RE | Admit: 2023-02-05 | Discharge: 2023-02-05 | Disposition: A | Discharge status: Home / Self Care | Payer: BC Managed Care – PPO | Source: Ambulatory Visit | Attending: Surgery | Admitting: Surgery

## 2023-02-05 ENCOUNTER — Other Ambulatory Visit: Payer: Self-pay

## 2023-02-05 ENCOUNTER — Encounter (HOSPITAL_COMMUNITY)
Admission: RE | Admit: 2023-02-05 | Discharge: 2023-02-05 | Disposition: A | Discharge status: Home / Self Care | Payer: BC Managed Care – PPO | Source: Ambulatory Visit | Attending: Surgery | Admitting: Surgery

## 2023-02-05 ENCOUNTER — Other Ambulatory Visit: Payer: Self-pay | Admitting: *Deleted

## 2023-02-05 VITALS — BP 112/71 | HR 107 | Temp 98.2°F | Resp 18 | Ht 69.0 in | Wt 173.9 lb

## 2023-02-05 DIAGNOSIS — I2510 Atherosclerotic heart disease of native coronary artery without angina pectoris: Secondary | ICD-10-CM

## 2023-02-05 DIAGNOSIS — Z01818 Encounter for other preprocedural examination: Secondary | ICD-10-CM

## 2023-02-05 HISTORY — DX: Headache, unspecified: R51.9

## 2023-02-05 LAB — URINALYSIS, ROUTINE W REFLEX MICROSCOPIC
Bacteria, UA: NONE SEEN
Bilirubin Urine: NEGATIVE
Glucose, UA: >=500 mg/dL — AB
Hgb urine dipstick: NEGATIVE
Ketones, ur: NEGATIVE mg/dL
Leukocytes,Ua: NEGATIVE
Nitrite: NEGATIVE
Protein, ur: NEGATIVE mg/dL
Specific Gravity, Urine: 1.024 (ref 1.005–1.030)
pH: 5 (ref 5.0–8.0)

## 2023-02-05 LAB — APTT: aPTT: 31 s (ref 24–36)

## 2023-02-05 LAB — TYPE AND SCREEN
ABO/RH(D): O POS
Antibody Screen: NEGATIVE

## 2023-02-05 LAB — VAS US DOPPLER PRE CABG

## 2023-02-05 LAB — GLUCOSE, CAPILLARY: Glucose-Capillary: 288 mg/dL — ABNORMAL HIGH (ref 70–99)

## 2023-02-05 LAB — PROTIME-INR
INR: 1.1 (ref 0.8–1.2)
Prothrombin Time: 14 s (ref 11.4–15.2)

## 2023-02-05 LAB — SURGICAL PCR SCREEN
MRSA, PCR: NEGATIVE
Staphylococcus aureus: NEGATIVE

## 2023-02-05 NOTE — Progress Notes (Signed)
 VASCULAR LAB    Pre CABG Dopplers has been performed.  See CV proc for preliminary results.   Kamri Gotsch, RVT 02/05/2023, 9:46 AM

## 2023-02-05 NOTE — Progress Notes (Addendum)
 CBC, CMP and A1c labs from PAT appointment hemolyzed. Will need to be recollected DOS. Orders placed.  Darius Bump, RN with [facility] made aware and she will notify Dr. Laneta Simmers.

## 2023-02-05 NOTE — Progress Notes (Signed)
 PCP - Dr. Norleen General Cardiologist - Dr. Jayson Sierras - last office visit 01/07/2023 Endocrinologist - Dr. Ethelle Earl - last office visit 10/24/2022  PPM/ICD - Denies Device Orders - n/a Rep Notified - n/a  Chest x-ray - 02/05/2023 EKG - 02/05/2023 Stress Test - 10/30/2022 ECHO - 11/04/2022 Cardiac Cath - 01/13/2023  Sleep Study - Denies CPAP - n/a  Pt is DM2. He has a Libre1 on his right arm. Normal fasting glucose is 150-175. CBG at pre-op appointment 288. Pt has only had black coffee today and has not taken his DM medications. A1c result pending. Pt and wife instructed to try to eat low sugar/carbs leading up to surgery to optimize his glucose.  Last dose of GLP1 agonist- n/a GLP1 instructions: n/a  Blood Thinner Instructions: n/a Aspirin  Instructions: Pt will continue taking ASA through day before surgery and NONE morning of surgery  NPO after midnight  COVID TEST- n/a   Anesthesia review: Yes. High glucose result at PAT appointment.   Patient denies shortness of breath, fever, cough and chest pain at PAT appointment. Pt denies any respiratory illness/infection in the last two months.    All instructions explained to the patient, with a verbal understanding of the material. Patient agrees to go over the instructions while at home for a better understanding. Patient also instructed to self quarantine after being tested for COVID-19. The opportunity to ask questions was provided.

## 2023-02-06 MED ORDER — DEXMEDETOMIDINE HCL IN NACL 400 MCG/100ML IV SOLN
0.1000 ug/kg/h | INTRAVENOUS | Status: DC
  Filled 2023-02-06: qty 100

## 2023-02-06 MED ORDER — NOREPINEPHRINE 4 MG/250ML-% IV SOLN
0.0000 ug/min | INTRAVENOUS | Status: DC
  Filled 2023-02-06: qty 250

## 2023-02-06 MED ORDER — MAGNESIUM SULFATE 50 % IJ SOLN
40.0000 meq | INTRAMUSCULAR | Status: DC
  Filled 2023-02-06: qty 9.85

## 2023-02-06 MED ORDER — VANCOMYCIN HCL 1250 MG/250ML IV SOLN
1250.0000 mg | INTRAVENOUS | Status: AC
  Administered 2023-02-09: 1250 mg via INTRAVENOUS
  Filled 2023-02-06: qty 250

## 2023-02-06 MED ORDER — PHENYLEPHRINE HCL-NACL 20-0.9 MG/250ML-% IV SOLN
30.0000 ug/min | INTRAVENOUS | Status: DC
  Filled 2023-02-06: qty 250

## 2023-02-06 MED ORDER — NITROGLYCERIN IN D5W 200-5 MCG/ML-% IV SOLN
2.0000 ug/min | INTRAVENOUS | Status: DC
  Filled 2023-02-06: qty 250

## 2023-02-06 MED ORDER — PHENYLEPHRINE HCL-NACL 20-0.9 MG/250ML-% IV SOLN
30.0000 ug/min | INTRAVENOUS | Status: AC
  Administered 2023-02-09: 30 ug/min via INTRAVENOUS
  Filled 2023-02-06: qty 250

## 2023-02-06 MED ORDER — HEPARIN 30,000 UNITS/1000 ML (OHS) CELLSAVER SOLUTION
Status: DC
  Filled 2023-02-06: qty 1000

## 2023-02-06 MED ORDER — EPINEPHRINE HCL 5 MG/250ML IV SOLN IN NS
0.0000 ug/min | INTRAVENOUS | Status: DC
  Filled 2023-02-06: qty 250

## 2023-02-06 MED ORDER — CEFAZOLIN SODIUM-DEXTROSE 2-4 GM/100ML-% IV SOLN
2.0000 g | INTRAVENOUS | Status: AC
  Administered 2023-02-09: 2 g via INTRAVENOUS
  Filled 2023-02-06: qty 100

## 2023-02-06 MED ORDER — POTASSIUM CHLORIDE 2 MEQ/ML IV SOLN
80.0000 meq | INTRAVENOUS | Status: DC
  Filled 2023-02-06: qty 40

## 2023-02-06 MED ORDER — CEFAZOLIN SODIUM-DEXTROSE 2-4 GM/100ML-% IV SOLN
2.0000 g | INTRAVENOUS | Status: DC
  Filled 2023-02-06: qty 100

## 2023-02-06 MED ORDER — TRANEXAMIC ACID (OHS) PUMP PRIME SOLUTION
2.0000 mg/kg | INTRAVENOUS | Status: DC
  Filled 2023-02-06: qty 1.58

## 2023-02-06 MED ORDER — PLASMA-LYTE A IV SOLN
INTRAVENOUS | Status: DC
  Filled 2023-02-06: qty 2.5

## 2023-02-06 MED ORDER — TRANEXAMIC ACID 1000 MG/10ML IV SOLN
1.5000 mg/kg/h | INTRAVENOUS | Status: DC
  Filled 2023-02-06: qty 25

## 2023-02-06 MED ORDER — INSULIN REGULAR(HUMAN) IN NACL 100-0.9 UT/100ML-% IV SOLN
INTRAVENOUS | Status: AC
  Administered 2023-02-09: 3.6 [IU]/h via INTRAVENOUS
  Filled 2023-02-06: qty 100

## 2023-02-06 MED ORDER — TRANEXAMIC ACID 1000 MG/10ML IV SOLN
1.5000 mg/kg/h | INTRAVENOUS | Status: AC
  Administered 2023-02-09: 1.5 mg/kg/h via INTRAVENOUS
  Filled 2023-02-06: qty 25

## 2023-02-06 MED ORDER — INSULIN REGULAR(HUMAN) IN NACL 100-0.9 UT/100ML-% IV SOLN
INTRAVENOUS | Status: DC
  Filled 2023-02-06: qty 100

## 2023-02-06 MED ORDER — MILRINONE LACTATE IN DEXTROSE 20-5 MG/100ML-% IV SOLN
0.3000 ug/kg/min | INTRAVENOUS | Status: DC
  Filled 2023-02-06: qty 100

## 2023-02-06 MED ORDER — TRANEXAMIC ACID (OHS) BOLUS VIA INFUSION
15.0000 mg/kg | INTRAVENOUS | Status: AC
  Administered 2023-02-09: 1183.5 mg via INTRAVENOUS
  Filled 2023-02-06: qty 1184

## 2023-02-06 MED ORDER — TRANEXAMIC ACID (OHS) BOLUS VIA INFUSION
15.0000 mg/kg | INTRAVENOUS | Status: DC
  Filled 2023-02-06: qty 1184

## 2023-02-06 MED ORDER — DEXMEDETOMIDINE HCL IN NACL 400 MCG/100ML IV SOLN
0.1000 ug/kg/h | INTRAVENOUS | Status: AC
  Administered 2023-02-09: .4 ug/kg/h via INTRAVENOUS
  Filled 2023-02-06: qty 100

## 2023-02-06 MED ORDER — VANCOMYCIN HCL 1250 MG/250ML IV SOLN
1250.0000 mg | INTRAVENOUS | Status: DC
  Filled 2023-02-06: qty 250

## 2023-02-08 NOTE — Anesthesia Preprocedure Evaluation (Addendum)
 Anesthesia Evaluation  Patient identified by MRN, date of birth, ID band Patient awake    Reviewed: Allergy & Precautions, NPO status , Patient's Chart, lab work & pertinent test results  History of Anesthesia Complications Negative for: history of anesthetic complications  Airway Mallampati: II  TM Distance: >3 FB Neck ROM: Full    Dental  (+) Edentulous Upper, Edentulous Lower   Pulmonary Current Smoker and Patient abstained from smoking. collapsed lung - 20 years ago    Pulmonary exam normal breath sounds clear to auscultation       Cardiovascular hypertension, Pt. on medications + CAD, + Past MI and + Cardiac Stents (stentsx5, stents in 10 years ago, on plavix , last dose 08/31/2019)   Rhythm:Regular Rate:Normal  Cath 12/2022 Severe multivessel coronary artery disease  Severe calcific proximal and mid LAD stenoses of 80%  Severe in-stent restenosis in the first obtuse marginal branch and total occlusion of the mid circumflex beyond the OM  Total occlusion of the RCA at the ostium  Collateral filling of the right posterolateral branch and second OM   Echo 10/2022  1. Left ventricular ejection fraction, by estimation, is 40 to 45%. The left ventricle has mildly decreased function. The left ventricle demonstrates global hypokinesis. There is mild left ventricular hypertrophy. Left ventricular diastolic parameters are indeterminate.   2. Right ventricular systolic function is normal. The right ventricular size is normal. Tricuspid regurgitation signal is inadequate for assessing PA pressure.   3. The mitral valve is normal in structure. No evidence of mitral valve regurgitation. No evidence of mitral stenosis.   4. The aortic valve is tricuspid. There is mild calcification of the aortic valve. There is mild thickening of the aortic valve. Aortic valve regurgitation is not visualized. No aortic stenosis is present.   5. The  inferior vena cava is normal in size with greater than 50% respiratory variability, suggesting right atrial pressure of 3 mmHg.   Comparison(s): EF 60-65%. Trivial mitral and tricuspid regurgitation.       Neuro/Psych  Headaches, Seizures -,  TIA Neuromuscular disease CVA, No Residual Symptoms    GI/Hepatic Neg liver ROS, hiatal hernia,GERD  Medicated,,  Endo/Other  diabetes, Well Controlled, Type 2, Insulin  Dependent    Renal/GU negative Renal ROS     Musculoskeletal negative musculoskeletal ROS (+)    Abdominal   Peds  Hematology negative hematology ROS (+)   Anesthesia Other Findings   Reproductive/Obstetrics                             Anesthesia Physical Anesthesia Plan  ASA: 4  Anesthesia Plan: General   Post-op Pain Management:    Induction: Intravenous  PONV Risk Score and Plan: 1 and Treatment may vary due to age or medical condition and Midazolam   Airway Management Planned: Oral ETT  Additional Equipment: Arterial line, ClearSight, CVP, PA Cath, TEE and Ultrasound Guidance Line Placement  Intra-op Plan: Utilization Of Total Body Hypothermia per surgeon request  Post-operative Plan: Post-operative intubation/ventilation  Informed Consent: I have reviewed the patients History and Physical, chart, labs and discussed the procedure including the risks, benefits and alternatives for the proposed anesthesia with the patient or authorized representative who has indicated his/her understanding and acceptance.     Dental advisory given  Plan Discussed with: CRNA  Anesthesia Plan Comments:         Anesthesia Quick Evaluation

## 2023-02-08 NOTE — H&P (Signed)
 [address]       [place] [address]             [phone]      Cardiothoracic Surgery Admission History and Physical   PCP is Marvine Rush, MD Referring Provider is Wonda Sharper, MD       Chief Complaint  Patient presents with   Coronary Artery Disease           HPI:   The patient is a 63 year old gentleman with a history of type 2 diabetes with diabetic peripheral neuropathy, hyperlipidemia, history of heavy smoking with ongoing 1/2 pack/day smoking, prior stroke in 2018, and coronary disease status post DES to the PLB, BMS to the mid and distal RCA, and DES to the left circumflex and distal LAD in 2010.  He has been followed by Dr. Debera and was seen in August at which time he was clinically stable.  He is now having more frequent episodes of substernal chest discomfort radiating into his shoulders and down his arms associated with shortness of breath.  These have been occurring mostly with exertion but he has had episodes at rest.  He had a 2D echocardiogram on 11/04/2022 showing a reduction in his ejection fraction to 40 to 45% with global hypokinesis.  His LV diastolic diameter was 5.2 cm.  There is no significant valvular abnormality.  His ejection fraction by echocardiogram in February 2020 was 60 to 65%.  He had a stress nuclear scan on 10/30/2022 that showed prior inferior infarction with no current ischemia.  His left ventricular ejection fraction was noted to be 18% with marked enlargement of the LV cavity at end diastole.  I am not sure why this is so much different than the follow-up echocardiogram unless he was severely ischemic during that test.   He is married and lives with his wife.  He continues to work and has his own [employer].  He continues to smoke about 1/2 pack of cigarettes per day but knows he needs to quit.  He used to smoke 2 packs/day.       Past Medical History:  Diagnosis Date   Anal lesion      Anal papilla/tags  - external lesion   Bilateral carpal tunnel syndrome 11/24/2018   Colon polyp      Hyperplastic rectosigmoid polyp 9/08   Coronary atherosclerosis of native coronary artery      DES to PLB, BMS to mid and distal RCA, DES to circumflex and distal LAD, 6/10 (5 stents)   Diabetic peripheral neuropathy (HCC) 11/24/2018   GERD (gastroesophageal reflux disease)     Helicobacter pylori (H. pylori) 09/2009    Treated with helidac   Hiatal hernia     Hyperplastic colon polyp 2008   Mixed hyperlipidemia     Myocardial infarction (HCC) 06/2008   Seizures (HCC)     Stroke [facility])      January 2018   Type 2 diabetes mellitus [facility])                 Past Surgical History:  Procedure Laterality Date   CHOLECYSTECTOMY       COLLAPSED LUNG        15 years ago   COLONOSCOPY   10/12/2006    Dr. Shaaron- rectosigmoid polyp s/phot snare polypectomy o/w normal rectum and terminal ileum. hyperplastic polyp on bx   COLONOSCOPY WITH PROPOFOL  N/A 09/01/2019    Procedure: COLONOSCOPY WITH PROPOFOL ;  Surgeon:  Rourk, Lamar HERO, MD;  Location: [facility] ENDO SUITE;  Service: Endoscopy;  Laterality: N/A;  8:30am   EP IMPLANTABLE DEVICE N/A 02/12/2016    Procedure: Loop Recorder Insertion;  Surgeon: Lynwood Rakers, MD;  Location: [facility] INVASIVE CV LAB;  Service: Cardiovascular;  Laterality: N/A;   ESOPHAGOGASTRODUODENOSCOPY   10/12/2006    Dr. Shaaron- examination of the tubular esophagus revealed no mucosal abnormalities. the EG junction was easily traversed. small hiatal hernia, the gastric mucosa o/w appeared normal. there was no infiltrating process or frank ulcer seen.   implantable loop recorder removal   09/29/2019    MDT Reveal LINQ removed in office by Dr Rakers   LEFT HEART CATH AND CORONARY ANGIOGRAPHY N/A 01/13/2023    Procedure: LEFT HEART CATH AND CORONARY ANGIOGRAPHY;  Surgeon: Wonda Sharper, MD;  Location: [facility] INVASIVE CV LAB;  Service: Cardiovascular;  Laterality: N/A;   TEE WITHOUT CARDIOVERSION N/A 02/12/2016     Procedure: TRANSESOPHAGEAL ECHOCARDIOGRAM (TEE);  Surgeon: Oneil JAYSON Parchment, MD;  Location: [facility] ENDOSCOPY;  Service: Cardiovascular;  Laterality: N/A;   WIRE IN APEX OF RIGHT LUNG        Since childhood               Family History  Problem Relation Age of Onset   Diabetes Mother     Heart disease Father     Cancer Brother     CVA Neg Hx            Social History Social History  Social History         Tobacco Use   Smoking status: Every Day      Current packs/day: 0.50      Average packs/day: 0.5 packs/day for 49.8 years (24.9 ttl pk-yrs)      Types: Cigarettes      Start date: 04/08/1973   Smokeless tobacco: Never  Vaping Use   Vaping status: Never Used  Substance Use Topics   Alcohol use: No      Alcohol/week: 0.0 standard drinks of alcohol   Drug use: No              Current Outpatient Medications  Medication Sig Dispense Refill   acetaminophen  (TYLENOL ) 500 MG tablet Take 500 mg by mouth every 6 (six) hours as needed (for headaches).       albuterol  (VENTOLIN  HFA) 108 (90 Base) MCG/ACT inhaler Inhale 1-2 puffs into the lungs every 6 (six) hours as needed for wheezing or shortness of breath.       aspirin  EC 81 MG EC tablet Take 1 tablet (81 mg total) by mouth daily. Swallow whole. 30 tablet 11   BD PEN NEEDLE NANO U/F 32G X 4 MM MISC USE AS DIRECTED FOUR TIMES DAILY. 100 each 2   Coenzyme Q10 (COQ10) 100 MG CAPS Take 100 mg by mouth daily.       Continuous Glucose Receiver (FREESTYLE LIBRE 3 READER) DEVI 1 Piece by Does not apply route once as needed for up to 1 dose. 1 each 0   Continuous Glucose Sensor (FREESTYLE LIBRE 2 SENSOR) MISC APPLY 1 PATCH TO SKIN ONCE EVERY 14 DAYS 2 each 2   gabapentin  (NEURONTIN ) 100 MG capsule Take 100 mg by mouth daily.        Insulin  Lispro Prot & Lispro (HUMALOG  MIX 75/25 KWIKPEN) (75-25) 100 UNIT/ML Kwikpen Inject 60 Units into the skin 2 (two) times daily with a meal. (Patient taking differently: Inject 60 Units into the skin See  admin instructions. 60 units at bedtime, but checks blood sugar throughout the day for as needed) 30 mL 2   metoprolol  succinate (TOPROL -XL) 25 MG 24 hr tablet Take 1 tablet (25 mg total) by mouth daily. 90 tablet 1   nitroGLYCERIN  (NITROSTAT ) 0.4 MG SL tablet DISSOLVE ONE TABLET UNDER THE TONGUE EVERY 5 MINUTES AS NEEDED FOR CHEST PAIN.  DO NOT EXCEED A TOTAL OF 3 DOSES IN 15 MINUTES 25 tablet 3   nortriptyline  (PAMELOR ) 10 MG capsule Take 4 capsules (40 mg total) by mouth at bedtime. 120 capsule 5   pantoprazole  (PROTONIX ) 40 MG tablet Take 40 mg by mouth daily before breakfast.       rosuvastatin  (CRESTOR ) 40 MG tablet Take 40 mg by mouth at bedtime.       sacubitril-valsartan (ENTRESTO ) 24-26 MG Take 1 tablet by mouth 2 (two) times daily. 60 tablet 4   traMADol  (ULTRAM ) 50 MG tablet Take 1 tablet (50 mg total) by mouth every 6 (six) hours as needed for moderate pain. (Patient taking differently: Take 50 mg by mouth daily.) 15 tablet 0   Ubrogepant  (UBRELVY ) 100 MG TABS Take 1 tablet (100 mg total) by mouth daily as needed. Take one tablet at onset of headache, may repeat 1 tablet in 2 hours, no more than 2 tablets in 24 hours 10 tablet 11      No current facility-administered medications for this visit.        Allergies       Allergies  Allergen Reactions   Keppra  [Levetiracetam ]        Hallucination   Topamax  [Topiramate ]        irritable        Review of Systems  Constitutional:  Positive for activity change and fatigue.  HENT: Negative.    Eyes: Negative.   Respiratory:  Positive for shortness of breath.   Cardiovascular:  Positive for chest pain. Negative for leg swelling.  Gastrointestinal: Negative.   Endocrine: Negative.   Genitourinary: Negative.   Musculoskeletal: Negative.   Skin: Negative.   Allergic/Immunologic: Negative.   Neurological:  Negative for dizziness and syncope.       Peripheral neuropathy  Hematological: Negative.   Psychiatric/Behavioral:  Negative.        BP 121/78 (BP Location: Left Arm, Patient Position: Sitting, Cuff Size: Normal)   Pulse 100   Resp 20   Ht 5' 9 (1.753 m)   Wt 175 lb (79.4 kg)   SpO2 93% Comment: RA  BMI 25.84 kg/m  Physical Exam Constitutional:      Appearance: Normal appearance. He is normal weight.  HENT:     Head: Normocephalic and atraumatic.  Eyes:     Extraocular Movements: Extraocular movements intact.     Conjunctiva/sclera: Conjunctivae normal.     Pupils: Pupils are equal, round, and reactive to light.  Neck:     Vascular: No carotid bruit.  Cardiovascular:     Rate and Rhythm: Normal rate and regular rhythm.     Pulses: Normal pulses.     Heart sounds: Normal heart sounds. No murmur heard. Pulmonary:     Effort: Pulmonary effort is normal.     Breath sounds: Normal breath sounds.  Abdominal:     General: There is no distension.     Tenderness: There is no abdominal tenderness.  Musculoskeletal:        General: No swelling. Normal range of motion.     Cervical back: Normal range of motion and  neck supple.  Skin:    General: Skin is warm and dry.  Neurological:     General: No focal deficit present.     Mental Status: He is alert and oriented to person, place, and time.  Psychiatric:        Mood and Affect: Mood normal.        Behavior: Behavior normal.          Diagnostic Tests:   ECHOCARDIOGRAM REPORT       Patient Name:   TEDRIC LEETH Date of Exam: 11/04/2022  Medical Rec #:  [id number]      Height:       69.0 in  Accession #:    [id number]     Weight:       175.9 lb  Date of Birth:  [birth year]      BSA:          1.956 m  Patient Age:    62 years       BP:           108/76 mmHg  Patient Gender: M              HR:           109 bpm.  Exam Location:  [place]   Procedure: 2D Echo, Cardiac Doppler, Color Doppler and Intracardiac             Opacification Agent   Indications:    R06.9 DOE    History:        Patient has prior history of Echocardiogram  examinations,  most                 recent 03/04/2018. Previous Myocardial Infarction and CAD,  Stroke                 and TIA; Risk Factors:Diabetes, Hypertension, Dyslipidemia  and                 Current Smoker.    Sonographer:    Bascom Burows RCS, RVS  Referring Phys: [phone] [facility]     Sonographer Comments: Global longitudinal strain was attempted.  IMPRESSIONS     1. Left ventricular ejection fraction, by estimation, is 40 to 45%. The  left ventricle has mildly decreased function. The left ventricle  demonstrates global hypokinesis. There is mild left ventricular  hypertrophy. Left ventricular diastolic parameters  are indeterminate.   2. Right ventricular systolic function is normal. The right ventricular  size is normal. Tricuspid regurgitation signal is inadequate for assessing  PA pressure.   3. The mitral valve is normal in structure. No evidence of mitral valve  regurgitation. No evidence of mitral stenosis.   4. The aortic valve is tricuspid. There is mild calcification of the  aortic valve. There is mild thickening of the aortic valve. Aortic valve  regurgitation is not visualized. No aortic stenosis is present.   5. The inferior vena cava is normal in size with greater than 50%  respiratory variability, suggesting right atrial pressure of 3 mmHg.   Comparison(s): EF 60-65%. Trivial mitral and tricuspid regurgitation.   FINDINGS   Left Ventricle: Left ventricular ejection fraction, by estimation, is 40  to 45%. The left ventricle has mildly decreased function. The left  ventricle demonstrates global hypokinesis. Definity  contrast agent was  given IV to delineate the left ventricular   endocardial borders. Global longitudinal strain performed but not  reported based on interpreter judgement due to suboptimal tracking.  The  left ventricular internal cavity size was normal in size. There is mild  left ventricular hypertrophy. Left  ventricular diastolic  parameters are indeterminate.   Right Ventricle: The right ventricular size is normal. Right vetricular  wall thickness was not well visualized. Right ventricular systolic  function is normal. Tricuspid regurgitation signal is inadequate for  assessing PA pressure.   Left Atrium: Left atrial size was normal in size.   Right Atrium: Right atrial size was normal in size.   Pericardium: There is no evidence of pericardial effusion.   Mitral Valve: The mitral valve is normal in structure. No evidence of  mitral valve regurgitation. No evidence of mitral valve stenosis. MV peak  gradient, 9.4 mmHg. The mean mitral valve gradient is 3.0 mmHg.   Tricuspid Valve: The tricuspid valve is normal in structure. Tricuspid  valve regurgitation is not demonstrated. No evidence of tricuspid  stenosis.   Aortic Valve: The aortic valve is tricuspid. There is mild calcification  of the aortic valve. There is mild thickening of the aortic valve. There  is mild aortic valve annular calcification. Aortic valve regurgitation is  not visualized. No aortic stenosis   is present. Aortic valve mean gradient measures 4.0 mmHg. Aortic valve  peak gradient measures 5.2 mmHg. Aortic valve area, by VTI measures 2.40  cm.   Pulmonic Valve: The pulmonic valve was not well visualized. Pulmonic valve  regurgitation is not visualized. No evidence of pulmonic stenosis.   Aorta: The aortic root and ascending aorta are structurally normal, with  no evidence of dilitation.   Venous: The inferior vena cava is normal in size with greater than 50%  respiratory variability, suggesting right atrial pressure of 3 mmHg.   IAS/Shunts: No atrial level shunt detected by color flow Doppler.     LEFT VENTRICLE  PLAX 2D  LVIDd:         5.20 cm      Diastology  LVIDs:         4.10 cm      LV e' medial:    5.98 cm/s  LV PW:         1.10 cm      LV E/e' medial:  12.6  LV IVS:        1.10 cm      LV e' lateral:   6.34 cm/s   LVOT diam:     2.30 cm      LV E/e' lateral: 11.9  LV SV:         59  LV SV Index:   30  LVOT Area:     4.15 cm    LV Volumes (MOD)  LV vol d, MOD A2C: 103.0 ml  LV vol d, MOD A4C: 129.0 ml  LV vol s, MOD A2C: 67.6 ml  LV vol s, MOD A4C: 80.3 ml  LV SV MOD A2C:     35.4 ml  LV SV MOD A4C:     129.0 ml  LV SV MOD BP:      40.6 ml   RIGHT VENTRICLE  RV Basal diam:  2.20 cm  RV Mid diam:    2.60 cm  RV S prime:     15.30 cm/s  TAPSE (M-mode): 1.8 cm   LEFT ATRIUM             Index        RIGHT ATRIUM          Index  LA diam:  3.30 cm 1.69 cm/m   RA Area:     9.75 cm  LA Vol (A2C):   36.8 ml 18.81 ml/m  RA Volume:   16.30 ml 8.33 ml/m  LA Vol (A4C):   30.2 ml 15.44 ml/m  LA Biplane Vol: 36.8 ml 18.81 ml/m   AORTIC VALVE                    PULMONIC VALVE  AV Area (Vmax):    2.79 cm     PV Vmax:       0.96 m/s  AV Area (Vmean):   2.49 cm     PV Peak grad:  3.7 mmHg  AV Area (VTI):     2.40 cm  AV Vmax:           114.00 cm/s  AV Vmean:          90.200 cm/s  AV VTI:            0.244 m  AV Peak Grad:      5.2 mmHg  AV Mean Grad:      4.0 mmHg  LVOT Vmax:         76.50 cm/s  LVOT Vmean:        54.100 cm/s  LVOT VTI:          0.141 m  LVOT/AV VTI ratio: 0.58    AORTA  Ao Root diam: 3.80 cm  Ao Asc diam:  3.40 cm   MITRAL VALVE  MV Area (PHT): 3.81 cm     SHUNTS  MV Area VTI:   1.89 cm     Systemic VTI:  0.14 m  MV Peak grad:  9.4 mmHg     Systemic Diam: 2.30 cm  MV Mean grad:  3.0 mmHg  MV Vmax:       1.53 m/s  MV Vmean:      81.0 cm/s  MV Decel Time: 199 msec  MV E velocity: 75.30 cm/s  MV A velocity: 126.00 cm/s  MV E/A ratio:  0.60   Dorn Ross MD  Electronically signed by Dorn Ross MD  Signature Date/Time: 11/05/2022/9:30:13 AM        Final        Impression:   This 63 year old gentleman has severe multivessel coronary disease with progressive anginal symptoms.  I agree that coronary artery bypass graft surgery is the best  treatment to improve his symptoms and prevent further left ventricular dysfunction.  I think his LAD, OM1, and posterolateral branch of the RCA appear graftable.  I think one or both of his diagonal branches are probably graftable also and have high-grade ostial stenoses.  I reviewed the cardiac catheterization images with the patient and his wife and answered all their questions. I discussed the operative procedure with the patient and his wife including alternatives, benefits and risks; including but not limited to bleeding, blood transfusion, infection, stroke, myocardial infarction, graft failure, heart block requiring a permanent pacemaker, organ dysfunction, and death.  Helayne JINNY Hazel understands and agrees to proceed.     Plan:   Coronary bypass graft surgery.    Dorise MARLA Fellers, MD [facility] [phone]

## 2023-02-09 ENCOUNTER — Inpatient Hospital Stay (HOSPITAL_COMMUNITY)
Admission: RE | Admit: 2023-02-09 | Discharge: 2023-02-14 | DRG: 236 | Disposition: A | Discharge status: Home Health | Payer: BC Managed Care – PPO | Attending: Surgery | Admitting: Surgery

## 2023-02-09 ENCOUNTER — Encounter (HOSPITAL_COMMUNITY): Payer: Self-pay | Admitting: Surgery

## 2023-02-09 ENCOUNTER — Inpatient Hospital Stay (HOSPITAL_COMMUNITY): Payer: Self-pay | Admitting: Certified Registered Nurse Anesthetist

## 2023-02-09 ENCOUNTER — Inpatient Hospital Stay (HOSPITAL_COMMUNITY): Payer: Self-pay | Admitting: Physician Assistant

## 2023-02-09 ENCOUNTER — Other Ambulatory Visit: Payer: Self-pay

## 2023-02-09 ENCOUNTER — Encounter (HOSPITAL_COMMUNITY)
Admission: RE | Disposition: A | Discharge status: Home Health | Payer: Self-pay | Source: Home / Self Care | Attending: Surgery

## 2023-02-09 ENCOUNTER — Inpatient Hospital Stay (HOSPITAL_COMMUNITY): Payer: BC Managed Care – PPO

## 2023-02-09 DIAGNOSIS — E1165 Type 2 diabetes mellitus with hyperglycemia: Secondary | ICD-10-CM | POA: Diagnosis present

## 2023-02-09 DIAGNOSIS — F17213 Nicotine dependence, cigarettes, with withdrawal: Secondary | ICD-10-CM | POA: Diagnosis not present

## 2023-02-09 DIAGNOSIS — Z8619 Personal history of other infectious and parasitic diseases: Secondary | ICD-10-CM

## 2023-02-09 DIAGNOSIS — Z860102 Personal history of hyperplastic colon polyps: Secondary | ICD-10-CM | POA: Diagnosis not present

## 2023-02-09 DIAGNOSIS — Z794 Long term (current) use of insulin: Secondary | ICD-10-CM | POA: Diagnosis not present

## 2023-02-09 DIAGNOSIS — E1142 Type 2 diabetes mellitus with diabetic polyneuropathy: Secondary | ICD-10-CM | POA: Diagnosis present

## 2023-02-09 DIAGNOSIS — Z951 Presence of aortocoronary bypass graft: Secondary | ICD-10-CM | POA: Diagnosis present

## 2023-02-09 DIAGNOSIS — I25119 Atherosclerotic heart disease of native coronary artery with unspecified angina pectoris: Secondary | ICD-10-CM | POA: Diagnosis present

## 2023-02-09 DIAGNOSIS — Z9049 Acquired absence of other specified parts of digestive tract: Secondary | ICD-10-CM | POA: Diagnosis not present

## 2023-02-09 DIAGNOSIS — K219 Gastro-esophageal reflux disease without esophagitis: Secondary | ICD-10-CM | POA: Diagnosis present

## 2023-02-09 DIAGNOSIS — Z79899 Other long term (current) drug therapy: Secondary | ICD-10-CM

## 2023-02-09 DIAGNOSIS — R000 Tachycardia, unspecified: Secondary | ICD-10-CM | POA: Diagnosis not present

## 2023-02-09 DIAGNOSIS — Z833 Family history of diabetes mellitus: Secondary | ICD-10-CM

## 2023-02-09 DIAGNOSIS — Z8249 Family history of ischemic heart disease and other diseases of the circulatory system: Secondary | ICD-10-CM | POA: Diagnosis not present

## 2023-02-09 DIAGNOSIS — Z809 Family history of malignant neoplasm, unspecified: Secondary | ICD-10-CM | POA: Diagnosis not present

## 2023-02-09 DIAGNOSIS — I2510 Atherosclerotic heart disease of native coronary artery without angina pectoris: Secondary | ICD-10-CM

## 2023-02-09 DIAGNOSIS — I10 Essential (primary) hypertension: Secondary | ICD-10-CM | POA: Diagnosis present

## 2023-02-09 DIAGNOSIS — Z888 Allergy status to other drugs, medicaments and biological substances status: Secondary | ICD-10-CM

## 2023-02-09 DIAGNOSIS — I252 Old myocardial infarction: Secondary | ICD-10-CM | POA: Diagnosis not present

## 2023-02-09 DIAGNOSIS — Z8673 Personal history of transient ischemic attack (TIA), and cerebral infarction without residual deficits: Secondary | ICD-10-CM | POA: Diagnosis not present

## 2023-02-09 DIAGNOSIS — Z91119 Patient's noncompliance with dietary regimen due to unspecified reason: Secondary | ICD-10-CM

## 2023-02-09 DIAGNOSIS — Z955 Presence of coronary angioplasty implant and graft: Secondary | ICD-10-CM | POA: Diagnosis not present

## 2023-02-09 DIAGNOSIS — Z9889 Other specified postprocedural states: Secondary | ICD-10-CM | POA: Diagnosis not present

## 2023-02-09 DIAGNOSIS — E782 Mixed hyperlipidemia: Secondary | ICD-10-CM | POA: Diagnosis present

## 2023-02-09 DIAGNOSIS — D62 Acute posthemorrhagic anemia: Secondary | ICD-10-CM | POA: Diagnosis not present

## 2023-02-09 DIAGNOSIS — R569 Unspecified convulsions: Secondary | ICD-10-CM | POA: Diagnosis present

## 2023-02-09 DIAGNOSIS — Z01818 Encounter for other preprocedural examination: Secondary | ICD-10-CM

## 2023-02-09 DIAGNOSIS — Z7982 Long term (current) use of aspirin: Secondary | ICD-10-CM

## 2023-02-09 HISTORY — PX: CORONARY ARTERY BYPASS GRAFT: SHX141

## 2023-02-09 HISTORY — PX: TEE WITHOUT CARDIOVERSION: SHX5443

## 2023-02-09 LAB — POCT I-STAT, CHEM 8
BUN: 12 mg/dL (ref 8–23)
BUN: 10 mg/dL (ref 8–23)
BUN: 11 mg/dL (ref 8–23)
BUN: 10 mg/dL (ref 8–23)
BUN: 11 mg/dL (ref 8–23)
Calcium, Ion: 1.22 mmol/L (ref 1.15–1.40)
Calcium, Ion: 1.15 mmol/L (ref 1.15–1.40)
Calcium, Ion: 1.08 mmol/L — ABNORMAL LOW (ref 1.15–1.40)
Calcium, Ion: 1.07 mmol/L — ABNORMAL LOW (ref 1.15–1.40)
Calcium, Ion: 1.35 mmol/L (ref 1.15–1.40)
Chloride: 104 mmol/L (ref 98–111)
Chloride: 106 mmol/L (ref 98–111)
Chloride: 100 mmol/L (ref 98–111)
Chloride: 103 mmol/L (ref 98–111)
Chloride: 104 mmol/L (ref 98–111)
Creatinine, Ser: 0.6 mg/dL — ABNORMAL LOW (ref 0.61–1.24)
Creatinine, Ser: 0.6 mg/dL — ABNORMAL LOW (ref 0.61–1.24)
Creatinine, Ser: 0.6 mg/dL — ABNORMAL LOW (ref 0.61–1.24)
Creatinine, Ser: 0.6 mg/dL — ABNORMAL LOW (ref 0.61–1.24)
Creatinine, Ser: 0.7 mg/dL (ref 0.61–1.24)
Glucose, Bld: 144 mg/dL — ABNORMAL HIGH (ref 70–99)
Glucose, Bld: 122 mg/dL — ABNORMAL HIGH (ref 70–99)
Glucose, Bld: 115 mg/dL — ABNORMAL HIGH (ref 70–99)
Glucose, Bld: 127 mg/dL — ABNORMAL HIGH (ref 70–99)
Glucose, Bld: 161 mg/dL — ABNORMAL HIGH (ref 70–99)
HCT: 35 % — ABNORMAL LOW (ref 39.0–52.0)
HCT: 32 % — ABNORMAL LOW (ref 39.0–52.0)
HCT: 27 % — ABNORMAL LOW (ref 39.0–52.0)
HCT: 27 % — ABNORMAL LOW (ref 39.0–52.0)
HCT: 27 % — ABNORMAL LOW (ref 39.0–52.0)
Hemoglobin: 11.9 g/dL — ABNORMAL LOW (ref 13.0–17.0)
Hemoglobin: 10.9 g/dL — ABNORMAL LOW (ref 13.0–17.0)
Hemoglobin: 9.2 g/dL — ABNORMAL LOW (ref 13.0–17.0)
Hemoglobin: 9.2 g/dL — ABNORMAL LOW (ref 13.0–17.0)
Hemoglobin: 9.2 g/dL — ABNORMAL LOW (ref 13.0–17.0)
Potassium: 3.7 mmol/L (ref 3.5–5.1)
Potassium: 3.8 mmol/L (ref 3.5–5.1)
Potassium: 4.5 mmol/L (ref 3.5–5.1)
Potassium: 4.1 mmol/L (ref 3.5–5.1)
Potassium: 5.1 mmol/L (ref 3.5–5.1)
Sodium: 142 mmol/L (ref 135–145)
Sodium: 140 mmol/L (ref 135–145)
Sodium: 137 mmol/L (ref 135–145)
Sodium: 137 mmol/L (ref 135–145)
Sodium: 137 mmol/L (ref 135–145)
TCO2: 24 mmol/L (ref 22–32)
TCO2: 22 mmol/L (ref 22–32)
TCO2: 25 mmol/L (ref 22–32)
TCO2: 22 mmol/L (ref 22–32)
TCO2: 24 mmol/L (ref 22–32)

## 2023-02-09 LAB — CBC
HCT: 43.6 % (ref 39.0–52.0)
HCT: 33 % — ABNORMAL LOW (ref 39.0–52.0)
HCT: 35.6 % — ABNORMAL LOW (ref 39.0–52.0)
Hemoglobin: 14.2 g/dL (ref 13.0–17.0)
Hemoglobin: 10.9 g/dL — ABNORMAL LOW (ref 13.0–17.0)
Hemoglobin: 11.7 g/dL — ABNORMAL LOW (ref 13.0–17.0)
MCH: 26.5 pg (ref 26.0–34.0)
MCH: 26.7 pg (ref 26.0–34.0)
MCH: 26.6 pg (ref 26.0–34.0)
MCHC: 32.6 g/dL (ref 30.0–36.0)
MCHC: 33 g/dL (ref 30.0–36.0)
MCHC: 32.9 g/dL (ref 30.0–36.0)
MCV: 81.3 fL (ref 80.0–100.0)
MCV: 80.7 fL (ref 80.0–100.0)
MCV: 80.9 fL (ref 80.0–100.0)
Platelets: 170 10*3/uL (ref 150–400)
Platelets: 111 10*3/uL — ABNORMAL LOW (ref 150–400)
Platelets: 116 10*3/uL — ABNORMAL LOW (ref 150–400)
RBC: 5.36 MIL/uL (ref 4.22–5.81)
RBC: 4.09 MIL/uL — ABNORMAL LOW (ref 4.22–5.81)
RBC: 4.4 MIL/uL (ref 4.22–5.81)
RDW: 14.1 % (ref 11.5–15.5)
RDW: 14 % (ref 11.5–15.5)
RDW: 14.1 % (ref 11.5–15.5)
WBC: 9.1 10*3/uL (ref 4.0–10.5)
WBC: 14.3 10*3/uL — ABNORMAL HIGH (ref 4.0–10.5)
WBC: 12.4 10*3/uL — ABNORMAL HIGH (ref 4.0–10.5)
nRBC: 0 % (ref 0.0–0.2)
nRBC: 0 % (ref 0.0–0.2)
nRBC: 0 % (ref 0.0–0.2)

## 2023-02-09 LAB — POCT I-STAT 7, (LYTES, BLD GAS, ICA,H+H)
Acid-base deficit: 3 mmol/L — ABNORMAL HIGH (ref 0.0–2.0)
Acid-base deficit: 3 mmol/L — ABNORMAL HIGH (ref 0.0–2.0)
Acid-base deficit: 1 mmol/L (ref 0.0–2.0)
Acid-base deficit: 2 mmol/L (ref 0.0–2.0)
Bicarbonate: 22.4 mmol/L (ref 20.0–28.0)
Bicarbonate: 22.6 mmol/L (ref 20.0–28.0)
Bicarbonate: 24.8 mmol/L (ref 20.0–28.0)
Bicarbonate: 24 mmol/L (ref 20.0–28.0)
Calcium, Ion: 0.98 mmol/L — ABNORMAL LOW (ref 1.15–1.40)
Calcium, Ion: 1.29 mmol/L (ref 1.15–1.40)
Calcium, Ion: 1.26 mmol/L (ref 1.15–1.40)
Calcium, Ion: 1.25 mmol/L (ref 1.15–1.40)
HCT: 27 % — ABNORMAL LOW (ref 39.0–52.0)
HCT: 33 % — ABNORMAL LOW (ref 39.0–52.0)
HCT: 32 % — ABNORMAL LOW (ref 39.0–52.0)
HCT: 33 % — ABNORMAL LOW (ref 39.0–52.0)
Hemoglobin: 9.2 g/dL — ABNORMAL LOW (ref 13.0–17.0)
Hemoglobin: 11.2 g/dL — ABNORMAL LOW (ref 13.0–17.0)
Hemoglobin: 10.9 g/dL — ABNORMAL LOW (ref 13.0–17.0)
Hemoglobin: 11.2 g/dL — ABNORMAL LOW (ref 13.0–17.0)
O2 Saturation: 100 %
O2 Saturation: 99 %
O2 Saturation: 94 %
O2 Saturation: 99 %
Patient temperature: 36.8
Patient temperature: 36.8
Patient temperature: 36.7
Potassium: 4.1 mmol/L (ref 3.5–5.1)
Potassium: 4 mmol/L (ref 3.5–5.1)
Potassium: 4.4 mmol/L (ref 3.5–5.1)
Potassium: 4.6 mmol/L (ref 3.5–5.1)
Sodium: 139 mmol/L (ref 135–145)
Sodium: 139 mmol/L (ref 135–145)
Sodium: 138 mmol/L (ref 135–145)
Sodium: 139 mmol/L (ref 135–145)
TCO2: 24 mmol/L (ref 22–32)
TCO2: 24 mmol/L (ref 22–32)
TCO2: 26 mmol/L (ref 22–32)
TCO2: 25 mmol/L (ref 22–32)
pCO2 arterial: 39.5 mm[Hg] (ref 32–48)
pCO2 arterial: 42.6 mm[Hg] (ref 32–48)
pCO2 arterial: 45 mm[Hg] (ref 32–48)
pCO2 arterial: 45.4 mm[Hg] (ref 32–48)
pH, Arterial: 7.362 (ref 7.35–7.45)
pH, Arterial: 7.332 — ABNORMAL LOW (ref 7.35–7.45)
pH, Arterial: 7.349 — ABNORMAL LOW (ref 7.35–7.45)
pH, Arterial: 7.329 — ABNORMAL LOW (ref 7.35–7.45)
pO2, Arterial: 385 mm[Hg] — ABNORMAL HIGH (ref 83–108)
pO2, Arterial: 134 mm[Hg] — ABNORMAL HIGH (ref 83–108)
pO2, Arterial: 75 mm[Hg] — ABNORMAL LOW (ref 83–108)
pO2, Arterial: 158 mm[Hg] — ABNORMAL HIGH (ref 83–108)

## 2023-02-09 LAB — BASIC METABOLIC PANEL
Anion gap: 7 (ref 5–15)
BUN: 10 mg/dL (ref 8–23)
CO2: 22 mmol/L (ref 22–32)
Calcium: 8.4 mg/dL — ABNORMAL LOW (ref 8.9–10.3)
Chloride: 108 mmol/L (ref 98–111)
Creatinine, Ser: 0.72 mg/dL (ref 0.61–1.24)
GFR, Estimated: >60 mL/min (ref >60)
Glucose, Bld: 136 mg/dL — ABNORMAL HIGH (ref 70–99)
Potassium: 4.8 mmol/L (ref 3.5–5.1)
Sodium: 137 mmol/L (ref 135–145)

## 2023-02-09 LAB — POCT I-STAT EG7
Acid-base deficit: 2 mmol/L (ref 0.0–2.0)
Bicarbonate: 23.9 mmol/L (ref 20.0–28.0)
Calcium, Ion: 1.05 mmol/L — ABNORMAL LOW (ref 1.15–1.40)
HCT: 27 % — ABNORMAL LOW (ref 39.0–52.0)
Hemoglobin: 9.2 g/dL — ABNORMAL LOW (ref 13.0–17.0)
O2 Saturation: 70 %
Potassium: 5.3 mmol/L — ABNORMAL HIGH (ref 3.5–5.1)
Sodium: 140 mmol/L (ref 135–145)
TCO2: 25 mmol/L (ref 22–32)
pCO2, Ven: 43.5 mm[Hg] — ABNORMAL LOW (ref 44–60)
pH, Ven: 7.347 (ref 7.25–7.43)
pO2, Ven: 39 mm[Hg] (ref 32–45)

## 2023-02-09 LAB — HEMOGLOBIN A1C
Hgb A1c MFr Bld: 11 % — ABNORMAL HIGH (ref 4.8–5.6)
Mean Plasma Glucose: 269 mg/dL

## 2023-02-09 LAB — COMPREHENSIVE METABOLIC PANEL
ALT: 25 U/L (ref 0–44)
AST: 21 U/L (ref 15–41)
Albumin: 3.6 g/dL (ref 3.5–5.0)
Alkaline Phosphatase: 73 U/L (ref 38–126)
Anion gap: 7 (ref 5–15)
BUN: 13 mg/dL (ref 8–23)
CO2: 29 mmol/L (ref 22–32)
Calcium: 9.7 mg/dL (ref 8.9–10.3)
Chloride: 104 mmol/L (ref 98–111)
Creatinine, Ser: 0.86 mg/dL (ref 0.61–1.24)
GFR, Estimated: >60 mL/min (ref >60)
Glucose, Bld: 147 mg/dL — ABNORMAL HIGH (ref 70–99)
Potassium: 4.4 mmol/L (ref 3.5–5.1)
Sodium: 140 mmol/L (ref 135–145)
Total Bilirubin: 0.6 mg/dL (ref 0.0–1.2)
Total Protein: 7 g/dL (ref 6.5–8.1)

## 2023-02-09 LAB — GLUCOSE, CAPILLARY
Glucose-Capillary: 132 mg/dL — ABNORMAL HIGH (ref 70–99)
Glucose-Capillary: 121 mg/dL — ABNORMAL HIGH (ref 70–99)
Glucose-Capillary: 127 mg/dL — ABNORMAL HIGH (ref 70–99)
Glucose-Capillary: 148 mg/dL — ABNORMAL HIGH (ref 70–99)
Glucose-Capillary: 114 mg/dL — ABNORMAL HIGH (ref 70–99)
Glucose-Capillary: 145 mg/dL — ABNORMAL HIGH (ref 70–99)
Glucose-Capillary: 128 mg/dL — ABNORMAL HIGH (ref 70–99)
Glucose-Capillary: 162 mg/dL — ABNORMAL HIGH (ref 70–99)
Glucose-Capillary: 190 mg/dL — ABNORMAL HIGH (ref 70–99)
Glucose-Capillary: 176 mg/dL — ABNORMAL HIGH (ref 70–99)
Glucose-Capillary: 155 mg/dL — ABNORMAL HIGH (ref 70–99)

## 2023-02-09 LAB — ECHO INTRAOPERATIVE TEE
Height: 69 in
Weight: 2752 [oz_av]

## 2023-02-09 LAB — HEMOGLOBIN AND HEMATOCRIT, BLOOD
HCT: 27.3 % — ABNORMAL LOW (ref 39.0–52.0)
Hemoglobin: 9.1 g/dL — ABNORMAL LOW (ref 13.0–17.0)

## 2023-02-09 LAB — APTT: aPTT: 30 s (ref 24–36)

## 2023-02-09 LAB — ABO/RH: ABO/RH(D): O POS

## 2023-02-09 LAB — PLATELET COUNT: Platelets: 102 10*3/uL — ABNORMAL LOW (ref 150–400)

## 2023-02-09 LAB — MAGNESIUM: Magnesium: 2.9 mg/dL — ABNORMAL HIGH (ref 1.7–2.4)

## 2023-02-09 SURGERY — CORONARY ARTERY BYPASS GRAFTING (CABG)
Anesthesia: General | Site: Chest

## 2023-02-09 MED ORDER — ACETAMINOPHEN 160 MG/5ML PO SOLN
650.0000 mg | Freq: Once | ORAL | Status: AC
  Administered 2023-02-09: 650 mg
  Filled 2023-02-09: qty 20.3

## 2023-02-09 MED ORDER — ACETAMINOPHEN 160 MG/5ML PO SOLN: 1000.0000 mg | Freq: Four times a day (QID) | ORAL | Status: DC

## 2023-02-09 MED ORDER — SODIUM CHLORIDE 0.9% FLUSH
3.0000 mL | Freq: Two times a day (BID) | INTRAVENOUS | Status: DC
Start: 2023-02-10 — End: 2023-02-11
  Administered 2023-02-10 – 2023-02-11 (×3): 3 mL via INTRAVENOUS

## 2023-02-09 MED ORDER — CHLORHEXIDINE GLUCONATE 0.12 % MT SOLN
15.0000 mL | Freq: Once | OROMUCOSAL | Status: AC
  Administered 2023-02-09: 15 mL via OROMUCOSAL
  Filled 2023-02-09: qty 15

## 2023-02-09 MED ORDER — ROCURONIUM BROMIDE 10 MG/ML (PF) SYRINGE
PREFILLED_SYRINGE | INTRAVENOUS | Status: DC | PRN
  Administered 2023-02-09: 40 mg via INTRAVENOUS
  Administered 2023-02-09 (×2): 100 mg via INTRAVENOUS
  Administered 2023-02-09: 60 mg via INTRAVENOUS

## 2023-02-09 MED ORDER — GABAPENTIN 100 MG PO CAPS
100.0000 mg | ORAL_CAPSULE | Freq: Every day | ORAL | Status: DC
  Administered 2023-02-10 – 2023-02-14 (×5): 100 mg via ORAL
  Filled 2023-02-09 (×5): qty 1

## 2023-02-09 MED ORDER — ROCURONIUM BROMIDE 10 MG/ML (PF) SYRINGE
PREFILLED_SYRINGE | INTRAVENOUS | Status: AC
  Filled 2023-02-09: qty 10

## 2023-02-09 MED ORDER — ROSUVASTATIN CALCIUM 20 MG PO TABS
40.0000 mg | ORAL_TABLET | Freq: Every day | ORAL | Status: DC
  Administered 2023-02-10 – 2023-02-13 (×4): 40 mg via ORAL
  Filled 2023-02-09 (×4): qty 2

## 2023-02-09 MED ORDER — TRAMADOL HCL 50 MG PO TABS
50.0000 mg | ORAL_TABLET | ORAL | Status: DC | PRN
  Administered 2023-02-10 – 2023-02-12 (×3): 100 mg via ORAL
  Administered 2023-02-13: 50 mg via ORAL
  Filled 2023-02-09: qty 2
  Filled 2023-02-09: qty 1
  Filled 2023-02-09 (×2): qty 2

## 2023-02-09 MED ORDER — PLASMA-LYTE A IV SOLN
INTRAVENOUS | Status: DC | PRN
  Administered 2023-02-09: 1000 mL via INTRAVASCULAR

## 2023-02-09 MED ORDER — METOPROLOL TARTRATE 12.5 MG HALF TABLET
12.5000 mg | ORAL_TABLET | Freq: Two times a day (BID) | ORAL | Status: DC
  Administered 2023-02-09: 12.5 mg via ORAL
  Filled 2023-02-09: qty 1

## 2023-02-09 MED ORDER — LACTATED RINGERS IV SOLN: INTRAVENOUS | Status: DC | PRN

## 2023-02-09 MED ORDER — DOCUSATE SODIUM 100 MG PO CAPS
200.0000 mg | ORAL_CAPSULE | Freq: Every day | ORAL | Status: DC
  Administered 2023-02-10 – 2023-02-14 (×5): 200 mg via ORAL
  Filled 2023-02-09 (×5): qty 2

## 2023-02-09 MED ORDER — DEXMEDETOMIDINE HCL IN NACL 400 MCG/100ML IV SOLN
0.0000 ug/kg/h | INTRAVENOUS | Status: DC
Start: 2023-02-09 — End: 2023-02-10

## 2023-02-09 MED ORDER — MORPHINE SULFATE (PF) 2 MG/ML IV SOLN
1.0000 mg | INTRAVENOUS | Status: DC | PRN
  Administered 2023-02-09: 1 mg via INTRAVENOUS
  Administered 2023-02-09 – 2023-02-10 (×4): 2 mg via INTRAVENOUS
  Filled 2023-02-09 (×5): qty 1

## 2023-02-09 MED ORDER — MIDAZOLAM HCL (PF) 5 MG/ML IJ SOLN
INTRAMUSCULAR | Status: DC | PRN
  Administered 2023-02-09 (×5): 2 mg via INTRAVENOUS

## 2023-02-09 MED ORDER — NITROGLYCERIN IN D5W 200-5 MCG/ML-% IV SOLN: 0.0000 ug/min | INTRAVENOUS | Status: DC

## 2023-02-09 MED ORDER — FENTANYL CITRATE (PF) 250 MCG/5ML IJ SOLN
INTRAMUSCULAR | Status: AC
  Filled 2023-02-09: qty 5

## 2023-02-09 MED ORDER — BISACODYL 5 MG PO TBEC
10.0000 mg | DELAYED_RELEASE_TABLET | Freq: Every day | ORAL | Status: DC
  Administered 2023-02-10 – 2023-02-11 (×2): 10 mg via ORAL
  Filled 2023-02-09 (×2): qty 2

## 2023-02-09 MED ORDER — INSULIN REGULAR(HUMAN) IN NACL 100-0.9 UT/100ML-% IV SOLN: INTRAVENOUS | Status: DC

## 2023-02-09 MED ORDER — NORTRIPTYLINE HCL 10 MG PO CAPS
40.0000 mg | ORAL_CAPSULE | Freq: Every day | ORAL | Status: DC
  Administered 2023-02-09 – 2023-02-13 (×5): 40 mg via ORAL
  Filled 2023-02-09 (×7): qty 4

## 2023-02-09 MED ORDER — PROTAMINE SULFATE 10 MG/ML IV SOLN
INTRAVENOUS | Status: AC
  Filled 2023-02-09: qty 25

## 2023-02-09 MED ORDER — PLASMA-LYTE A IV SOLN
INTRAVENOUS | Status: DC
  Filled 2023-02-09: qty 5

## 2023-02-09 MED ORDER — PROPOFOL 10 MG/ML IV BOLUS
INTRAVENOUS | Status: AC
  Filled 2023-02-09: qty 20

## 2023-02-09 MED ORDER — BISACODYL 10 MG RE SUPP: 10.0000 mg | Freq: Every day | RECTAL | Status: DC

## 2023-02-09 MED ORDER — SODIUM CHLORIDE 0.9 % IV SOLN: INTRAVENOUS | Status: DC | PRN

## 2023-02-09 MED ORDER — ASPIRIN 81 MG PO CHEW
324.0000 mg | CHEWABLE_TABLET | Freq: Once | ORAL | Status: AC
  Administered 2023-02-09: 324 mg via ORAL
  Filled 2023-02-09: qty 4

## 2023-02-09 MED ORDER — HEPARIN SODIUM (PORCINE) 1000 UNIT/ML IJ SOLN
INTRAMUSCULAR | Status: AC
  Filled 2023-02-09: qty 1

## 2023-02-09 MED ORDER — 0.9 % SODIUM CHLORIDE (POUR BTL) OPTIME
TOPICAL | Status: DC | PRN
  Administered 2023-02-09: 5000 mL

## 2023-02-09 MED ORDER — CHLORHEXIDINE GLUCONATE 0.12 % MT SOLN
15.0000 mL | OROMUCOSAL | Status: AC
  Administered 2023-02-09: 15 mL via OROMUCOSAL
  Filled 2023-02-09: qty 15

## 2023-02-09 MED ORDER — PROPOFOL 10 MG/ML IV BOLUS
INTRAVENOUS | Status: DC | PRN
  Administered 2023-02-09: 50 mg via INTRAVENOUS

## 2023-02-09 MED ORDER — CEFAZOLIN SODIUM-DEXTROSE 2-4 GM/100ML-% IV SOLN
2.0000 g | Freq: Three times a day (TID) | INTRAVENOUS | Status: AC
  Administered 2023-02-09 – 2023-02-11 (×6): 2 g via INTRAVENOUS
  Filled 2023-02-09 (×6): qty 100

## 2023-02-09 MED ORDER — SODIUM BICARBONATE 8.4 % IV SOLN
50.0000 meq | Freq: Once | INTRAVENOUS | Status: AC
  Administered 2023-02-09: 50 meq via INTRAVENOUS

## 2023-02-09 MED ORDER — PROTAMINE SULFATE 10 MG/ML IV SOLN
INTRAVENOUS | Status: DC | PRN
  Administered 2023-02-09: 280 mg via INTRAVENOUS

## 2023-02-09 MED ORDER — MIDAZOLAM HCL (PF) 10 MG/2ML IJ SOLN
INTRAMUSCULAR | Status: AC
  Filled 2023-02-09: qty 2

## 2023-02-09 MED ORDER — PANTOPRAZOLE SODIUM 40 MG IV SOLR
40.0000 mg | Freq: Every day | INTRAVENOUS | Status: AC
  Administered 2023-02-09 – 2023-02-10 (×2): 40 mg via INTRAVENOUS
  Filled 2023-02-09 (×2): qty 10

## 2023-02-09 MED ORDER — CHLORHEXIDINE GLUCONATE CLOTH 2 % EX PADS
6.0000 | MEDICATED_PAD | Freq: Every day | CUTANEOUS | Status: DC
  Administered 2023-02-09 – 2023-02-11 (×3): 6 via TOPICAL

## 2023-02-09 MED ORDER — METOCLOPRAMIDE HCL 5 MG/ML IJ SOLN
10.0000 mg | Freq: Four times a day (QID) | INTRAMUSCULAR | Status: AC
  Administered 2023-02-09 – 2023-02-11 (×6): 10 mg via INTRAVENOUS
  Filled 2023-02-09 (×6): qty 2

## 2023-02-09 MED ORDER — ACETAMINOPHEN 500 MG PO TABS
1000.0000 mg | ORAL_TABLET | Freq: Four times a day (QID) | ORAL | Status: DC
  Administered 2023-02-09 – 2023-02-14 (×17): 1000 mg via ORAL
  Filled 2023-02-09 (×17): qty 2

## 2023-02-09 MED ORDER — PANTOPRAZOLE SODIUM 40 MG PO TBEC
40.0000 mg | DELAYED_RELEASE_TABLET | Freq: Every day | ORAL | Status: DC
  Administered 2023-02-11 – 2023-02-13 (×3): 40 mg via ORAL
  Filled 2023-02-09 (×4): qty 1

## 2023-02-09 MED ORDER — POTASSIUM CHLORIDE 10 MEQ/50ML IV SOLN: 10.0000 meq | INTRAVENOUS | Status: AC

## 2023-02-09 MED ORDER — PHENYLEPHRINE HCL-NACL 20-0.9 MG/250ML-% IV SOLN: 0.0000 ug/min | INTRAVENOUS | Status: DC

## 2023-02-09 MED ORDER — HEPARIN SODIUM (PORCINE) 1000 UNIT/ML IJ SOLN
INTRAMUSCULAR | Status: DC | PRN
  Administered 2023-02-09: 24000 [IU] via INTRAVENOUS

## 2023-02-09 MED ORDER — ASPIRIN 325 MG PO TBEC
325.0000 mg | DELAYED_RELEASE_TABLET | Freq: Every day | ORAL | Status: DC
Start: 2023-02-10 — End: 2023-02-14
  Administered 2023-02-10 – 2023-02-14 (×5): 325 mg via ORAL
  Filled 2023-02-09 (×6): qty 1

## 2023-02-09 MED ORDER — PHENYLEPHRINE 80 MCG/ML (10ML) SYRINGE FOR IV PUSH (FOR BLOOD PRESSURE SUPPORT)
PREFILLED_SYRINGE | INTRAVENOUS | Status: DC | PRN
  Administered 2023-02-09 (×2): 80 ug via INTRAVENOUS
  Administered 2023-02-09: 160 ug via INTRAVENOUS

## 2023-02-09 MED ORDER — ~~LOC~~ CARDIAC SURGERY, PATIENT & FAMILY EDUCATION
Freq: Once | Status: DC
  Filled 2023-02-09: qty 1

## 2023-02-09 MED ORDER — ALBUMIN HUMAN 5 % IV SOLN: INTRAVENOUS | Status: DC | PRN

## 2023-02-09 MED ORDER — METOPROLOL TARTRATE 12.5 MG HALF TABLET: 12.5000 mg | ORAL_TABLET | Freq: Once | ORAL | Status: DC

## 2023-02-09 MED ORDER — METOPROLOL TARTRATE 25 MG/10 ML ORAL SUSPENSION: 12.5000 mg | Freq: Two times a day (BID) | ORAL | Status: DC

## 2023-02-09 MED ORDER — HEMOSTATIC AGENTS (NO CHARGE) OPTIME
TOPICAL | Status: DC | PRN
  Administered 2023-02-09: 1 via TOPICAL

## 2023-02-09 MED ORDER — ALBUMIN HUMAN 5 % IV SOLN: 250.0000 mL | INTRAVENOUS | Status: DC | PRN

## 2023-02-09 MED ORDER — THROMBIN 20000 UNITS EX SOLR: OROMUCOSAL | Status: DC | PRN

## 2023-02-09 MED ORDER — FENTANYL CITRATE (PF) 250 MCG/5ML IJ SOLN
INTRAMUSCULAR | Status: DC | PRN
  Administered 2023-02-09: 50 ug via INTRAVENOUS
  Administered 2023-02-09: 100 ug via INTRAVENOUS
  Administered 2023-02-09: 150 ug via INTRAVENOUS
  Administered 2023-02-09: 300 ug via INTRAVENOUS
  Administered 2023-02-09: 50 ug via INTRAVENOUS
  Administered 2023-02-09: 100 ug via INTRAVENOUS

## 2023-02-09 MED ORDER — MIDAZOLAM HCL 2 MG/2ML IJ SOLN
2.0000 mg | INTRAMUSCULAR | Status: DC | PRN
Start: 2023-02-09 — End: 2023-02-10

## 2023-02-09 MED ORDER — CHLORHEXIDINE GLUCONATE 4 % EX SOLN: 30.0000 mL | CUTANEOUS | Status: DC

## 2023-02-09 MED ORDER — THROMBIN (RECOMBINANT) 20000 UNITS EX SOLR
CUTANEOUS | Status: AC
  Filled 2023-02-09: qty 20000

## 2023-02-09 MED ORDER — VANCOMYCIN HCL IN DEXTROSE 1-5 GM/200ML-% IV SOLN
1000.0000 mg | Freq: Once | INTRAVENOUS | Status: AC
  Administered 2023-02-09: 1000 mg via INTRAVENOUS
  Filled 2023-02-09: qty 200

## 2023-02-09 MED ORDER — METOPROLOL TARTRATE 5 MG/5ML IV SOLN
2.5000 mg | INTRAVENOUS | Status: DC | PRN
  Administered 2023-02-10: 2.5 mg via INTRAVENOUS
  Filled 2023-02-09: qty 5

## 2023-02-09 MED ORDER — MAGNESIUM SULFATE 4 GM/100ML IV SOLN
4.0000 g | Freq: Once | INTRAVENOUS | Status: AC
  Administered 2023-02-09: 4 g via INTRAVENOUS
  Filled 2023-02-09: qty 100

## 2023-02-09 MED ORDER — SODIUM CHLORIDE 0.9% FLUSH
3.0000 mL | INTRAVENOUS | Status: DC | PRN
Start: 2023-02-10 — End: 2023-02-11

## 2023-02-09 MED ORDER — DEXTROSE 50 % IV SOLN: 0.0000 mL | INTRAVENOUS | Status: DC | PRN

## 2023-02-09 MED ORDER — OXYCODONE HCL 5 MG PO TABS
5.0000 mg | ORAL_TABLET | ORAL | Status: DC | PRN
  Administered 2023-02-09 – 2023-02-11 (×7): 5 mg via ORAL
  Administered 2023-02-12: 10 mg via ORAL
  Administered 2023-02-13: 5 mg via ORAL
  Filled 2023-02-09: qty 2
  Filled 2023-02-09 (×4): qty 1
  Filled 2023-02-09: qty 2
  Filled 2023-02-09 (×3): qty 1

## 2023-02-09 MED ORDER — ONDANSETRON HCL 4 MG/2ML IJ SOLN: 4.0000 mg | Freq: Four times a day (QID) | INTRAMUSCULAR | Status: DC | PRN

## 2023-02-09 MED ORDER — ASPIRIN 81 MG PO CHEW: 324.0000 mg | CHEWABLE_TABLET | Freq: Every day | ORAL | Status: DC

## 2023-02-09 MED FILL — Potassium Chloride Inj 2 mEq/ML: INTRAVENOUS | Qty: 40 | Status: AC

## 2023-02-09 MED FILL — Heparin Sodium (Porcine) Inj 1000 Unit/ML: Qty: 1000 | Status: AC

## 2023-02-09 MED FILL — Magnesium Sulfate Inj 50%: INTRAMUSCULAR | Qty: 10 | Status: AC

## 2023-02-09 SURGICAL SUPPLY — 104 items
BAG DECANTER FOR FLEXI CONT (MISCELLANEOUS) ×2 IMPLANT
BLADE CLIPPER SURG (BLADE) ×2 IMPLANT
BLADE STERNUM SYSTEM 6 (BLADE) ×2 IMPLANT
BLADE SURG 11 STRL SS (BLADE) IMPLANT
BNDG ELASTIC 4INX 5YD STR LF (GAUZE/BANDAGES/DRESSINGS) IMPLANT
BNDG ELASTIC 4X5.8 VLCR STR LF (GAUZE/BANDAGES/DRESSINGS) ×2 IMPLANT
BNDG ELASTIC 6INX 5YD STR LF (GAUZE/BANDAGES/DRESSINGS) IMPLANT
BNDG ELASTIC 6X5.8 VLCR STR LF (GAUZE/BANDAGES/DRESSINGS) ×2 IMPLANT
BNDG GAUZE DERMACEA FLUFF 4 (GAUZE/BANDAGES/DRESSINGS) ×2 IMPLANT
CANISTER SUCT 3000ML PPV (MISCELLANEOUS) ×2 IMPLANT
CANNULA ARTERIAL VENT 3/8 20FR (CANNULA) IMPLANT
CANNULA MC2 2 STG 36/46 CONN (CANNULA) IMPLANT
CANNULA VESSEL 3MM BLUNT TIP (CANNULA) IMPLANT
CATH ROBINSON RED A/P 18FR (CATHETERS) ×4 IMPLANT
CATH THORACIC 28FR (CATHETERS) ×2 IMPLANT
CATH THORACIC 36FR (CATHETERS) ×2 IMPLANT
CATH THORACIC 36FR RT ANG (CATHETERS) ×2 IMPLANT
CLIP TI MEDIUM 24 (CLIP) IMPLANT
CLIP TI WIDE RED SMALL 24 (CLIP) IMPLANT
CONTAINER PROTECT SURGISLUSH (MISCELLANEOUS) ×4 IMPLANT
DERMABOND ADVANCED .7 DNX6 (GAUZE/BANDAGES/DRESSINGS) IMPLANT
DRAPE SRG 135X102X78XABS (DRAPES) ×2 IMPLANT
DRAPE WARM FLUID 44X44 (DRAPES) ×2 IMPLANT
DRSG COVADERM 4X14 (GAUZE/BANDAGES/DRESSINGS) ×2 IMPLANT
ELECT BLADE 4.0 EZ CLEAN MEGAD (MISCELLANEOUS) ×2 IMPLANT
ELECT CAUTERY BLADE 6.4 (BLADE) ×2 IMPLANT
ELECT REM PT RETURN 9FT ADLT (ELECTROSURGICAL) ×4 IMPLANT
ELECTRODE BLDE 4.0 EZ CLN MEGD (MISCELLANEOUS) IMPLANT
ELECTRODE REM PT RTRN 9FT ADLT (ELECTROSURGICAL) ×4 IMPLANT
FELT TEFLON 1X6 (MISCELLANEOUS) ×2 IMPLANT
GAUZE 4X4 16PLY ~~LOC~~+RFID DBL (SPONGE) IMPLANT
GAUZE SPONGE 4X4 12PLY STRL (GAUZE/BANDAGES/DRESSINGS) ×4 IMPLANT
GLOVE BIO SURGEON STRL SZ 6 (GLOVE) IMPLANT
GLOVE BIO SURGEON STRL SZ 6.5 (GLOVE) IMPLANT
GLOVE BIO SURGEON STRL SZ7 (GLOVE) IMPLANT
GLOVE BIO SURGEON STRL SZ7.5 (GLOVE) IMPLANT
GLOVE BIOGEL PI IND STRL 6 (GLOVE) IMPLANT
GLOVE BIOGEL PI IND STRL 6.5 (GLOVE) IMPLANT
GLOVE BIOGEL PI IND STRL 7.0 (GLOVE) IMPLANT
GLOVE BIOGEL PI IND STRL 9 (GLOVE) IMPLANT
GLOVE ECLIPSE 7.0 STRL STRAW (GLOVE) ×4 IMPLANT
GLOVE ORTHO TXT STRL SZ7.5 (GLOVE) IMPLANT
GLOVE SS BIOGEL STRL SZ 6 (GLOVE) IMPLANT
GLOVE SS BIOGEL STRL SZ 7 (GLOVE) IMPLANT
GOWN STRL REUS W/ TWL LRG LVL3 (GOWN DISPOSABLE) ×8 IMPLANT
GOWN STRL REUS W/ TWL XL LVL3 (GOWN DISPOSABLE) ×2 IMPLANT
HEMOSTAT POWDER SURGIFOAM 1G (HEMOSTASIS) ×6 IMPLANT
HEMOSTAT SURGICEL 2X14 (HEMOSTASIS) ×2 IMPLANT
INSERT FOGARTY 61MM (MISCELLANEOUS) IMPLANT
INSERT FOGARTY XLG (MISCELLANEOUS) IMPLANT
KIT BASIN OR (CUSTOM PROCEDURE TRAY) ×2 IMPLANT
KIT SUCTION CATH 14FR (SUCTIONS) ×2 IMPLANT
KIT TURNOVER KIT B (KITS) ×2 IMPLANT
KIT VASOVIEW HEMOPRO 2 VH 4000 (KITS) ×2 IMPLANT
KNIFE MICRO-UNI 3.5 30 DEG (BLADE) ×2 IMPLANT
NS IRRIG 1000ML POUR BTL (IV SOLUTION) ×10 IMPLANT
PACK E OPEN HEART (SUTURE) ×2 IMPLANT
PACK OPEN HEART (CUSTOM PROCEDURE TRAY) ×2 IMPLANT
PAD ARMBOARD 7.5X6 YLW CONV (MISCELLANEOUS) ×4 IMPLANT
PAD ELECT DEFIB RADIOL ZOLL (MISCELLANEOUS) ×2 IMPLANT
PENCIL BUTTON HOLSTER BLD 10FT (ELECTRODE) ×2 IMPLANT
POSITIONER HEAD DONUT 9IN (MISCELLANEOUS) ×2 IMPLANT
PUNCH AORTIC ROTATE 4.0MM (MISCELLANEOUS) IMPLANT
PUNCH AORTIC ROTATE 4.5MM 8IN (MISCELLANEOUS) ×2 IMPLANT
PUNCH AORTIC ROTATE 5MM 8IN (MISCELLANEOUS) IMPLANT
SET MPS 3-ND DEL (MISCELLANEOUS) IMPLANT
SPONGE INTESTINAL PEANUT (DISPOSABLE) IMPLANT
SPONGE T-LAP 18X18 ~~LOC~~+RFID (SPONGE) IMPLANT
SPONGE T-LAP 4X18 ~~LOC~~+RFID (SPONGE) IMPLANT
SUPPORT HEART JANKE-BARRON (MISCELLANEOUS) ×2 IMPLANT
SUT BONE WAX W31G (SUTURE) ×2 IMPLANT
SUT ETHILON 3 0 PS 1 (SUTURE) IMPLANT
SUT MNCRL AB 4-0 PS2 18 (SUTURE) IMPLANT
SUT PROLENE 3 0 SH DA (SUTURE) IMPLANT
SUT PROLENE 3 0 SH1 36 (SUTURE) ×2 IMPLANT
SUT PROLENE 4 0 SH DA (SUTURE) IMPLANT
SUT PROLENE 4-0 RB1 .5 CRCL 36 (SUTURE) IMPLANT
SUT PROLENE 5 0 C 1 36 (SUTURE) IMPLANT
SUT PROLENE 6 0 C 1 30 (SUTURE) IMPLANT
SUT PROLENE 7 0 BV 1 (SUTURE) IMPLANT
SUT PROLENE 7 0 BV1 MDA (SUTURE) ×2 IMPLANT
SUT PROLENE 8 0 BV175 6 (SUTURE) IMPLANT
SUT SILK 1 MH (SUTURE) IMPLANT
SUT SILK 2 0 SH (SUTURE) IMPLANT
SUT SILK 4-0 18XBRD TIE 12 (SUTURE) IMPLANT
SUT STEEL STERNAL CCS#1 18IN (SUTURE) IMPLANT
SUT STEEL SZ 6 DBL 3X14 BALL (SUTURE) IMPLANT
SUT VIC AB 1 CTX36XBRD ANBCTR (SUTURE) ×4 IMPLANT
SUT VIC AB 2-0 CT1 TAPERPNT 27 (SUTURE) IMPLANT
SUT VIC AB 2-0 CTX 27 (SUTURE) IMPLANT
SUT VIC AB 3-0 SH 27X BRD (SUTURE) IMPLANT
SUT VIC AB 3-0 X1 27 (SUTURE) IMPLANT
SYSTEM SAHARA CHEST DRAIN ATS (WOUND CARE) ×2 IMPLANT
TAPE CLOTH SURG 4X10 WHT LF (GAUZE/BANDAGES/DRESSINGS) IMPLANT
TAPE PAPER 2X10 WHT MICROPORE (GAUZE/BANDAGES/DRESSINGS) IMPLANT
TOWEL GREEN STERILE (TOWEL DISPOSABLE) ×2 IMPLANT
TOWEL GREEN STERILE FF (TOWEL DISPOSABLE) ×2 IMPLANT
TRAY FOLEY SLVR 14FR TEMP STAT (SET/KITS/TRAYS/PACK) IMPLANT
TRAY FOLEY SLVR 16FR TEMP STAT (SET/KITS/TRAYS/PACK) ×2 IMPLANT
TUBE CONNECTING 20X1/4 (TUBING) IMPLANT
TUBE SUCTION CARDIAC 10FR (CANNULA) IMPLANT
TUBING LAP HI FLOW INSUFFLATIO (TUBING) ×2 IMPLANT
UNDERPAD 30X36 HEAVY ABSORB (UNDERPADS AND DIAPERS) ×2 IMPLANT
WATER STERILE IRR 1000ML POUR (IV SOLUTION) ×4 IMPLANT

## 2023-02-09 NOTE — Anesthesia Procedure Notes (Addendum)
 Central Venous Catheter Insertion Performed by: Darlyn Rush, MD, anesthesiologist Start/End1/13/2025 7:00 AM, 02/09/2023 7:24 AM Patient location: Pre-op. Preanesthetic checklist: patient identified, IV checked, site marked, risks and benefits discussed, surgical consent, monitors and equipment checked, pre-op evaluation, timeout performed and anesthesia consent Position: Trendelenburg Lidocaine  1% used for infiltration and patient sedated Hand hygiene performed  and maximum sterile barriers used  Catheter size: 8.5 Fr Sheath introducer Procedure performed using ultrasound guided technique. Ultrasound Notes:anatomy identified, needle tip was noted to be adjacent to the nerve/plexus identified, no ultrasound evidence of intravascular and/or intraneural injection and image(s) printed for medical record Attempts: 1 Following insertion, line sutured, dressing applied and Biopatch. Post procedure assessment: blood return through all ports, free fluid flow and no air  Patient tolerated the procedure well with no immediate complications.

## 2023-02-09 NOTE — Anesthesia Procedure Notes (Signed)
 Central Venous Catheter Insertion Performed by: Darlyn Rush, MD, anesthesiologist Start/End1/13/2025 7:20 AM, 02/09/2023 7:24 AM Patient location: Pre-op. Preanesthetic checklist: patient identified, IV checked, site marked, risks and benefits discussed, surgical consent, monitors and equipment checked, pre-op evaluation, timeout performed and anesthesia consent Hand hygiene performed  and maximum sterile barriers used  Total catheter length 47. PA cath was placed.Swan type:thermodilution Procedure performed without using ultrasound guided technique. Attempts: 1 Post procedure assessment: free fluid flow and no air  Patient tolerated the procedure well with no immediate complications.

## 2023-02-09 NOTE — Brief Op Note (Signed)
 02/09/2023  9:40 AM  PATIENT:  Nicholas [name]  63 y.o. male  PRE-OPERATIVE DIAGNOSIS:  CORONARY ARTERY DISEASE  POST-OPERATIVE DIAGNOSIS:  CORONARY ARTERY DISEASE  PROCEDURE:  Procedure(s):  CORONARY ARTERY BYPASS GRAFTING X 5 -LIMA to LAD -SVG to PLB off RCA -SVG to OM -SVG to DIAGONAL 1 -SVG to DIAGONAL 2  TRANSESOPHAGEAL ECHOCARDIOGRAM (TEE) (N/A)  ENDOSCOPIC HARVEST GREATER SAPHENOUS VEIN -Right and Left Thigh  Vein harvest time: 75 min Vein prep time: 20 min  SURGEON:  Surgeons and Role:    * Lucas Dorise POUR, MD - Primary  PHYSICIAN ASSISTANT: Rocky Shad PA-C  ASSISTANTS: Doyal Music RNFA   ANESTHESIA:   general  EBL:  800 mL   BLOOD ADMINISTERED:   CC CELLSAVER  DRAINS:  Left Pleural Chest Tubes, Mediastinal Chest Drains    LOCAL MEDICATIONS USED:  NONE  SPECIMEN:  No Specimen  DISPOSITION OF SPECIMEN:  N/A  COUNTS:  YES  TOURNIQUET:  * No tourniquets in log *  DICTATION: .Dragon Dictation  PLAN OF CARE: Admit to inpatient   PATIENT DISPOSITION:  ICU - intubated and hemodynamically stable.   Delay start of Pharmacological VTE agent (>24hrs) due to surgical blood loss or risk of bleeding: yes

## 2023-02-09 NOTE — Op Note (Signed)
 CARDIOVASCULAR SURGERY OPERATIVE NOTE  02/09/2023  Surgeon:  Dorise LOIS Fellers, MD  First Assistant: Rocky Shad,  PA-C:   An experienced assistant was required given the complexity of this surgery and the standard of surgical care. The assistant was needed for endoscopic vein harvest, exposure, dissection, suctioning, retraction of delicate tissues and sutures, instrument exchange and for overall help during this procedure.   Preoperative Diagnosis:  Severe multi-vessel coronary artery disease   Postoperative Diagnosis:  Same   Procedure:  Median Sternotomy Extracorporeal circulation 3.   Coronary artery bypass grafting x 5  Left internal mammary artery graft to the LAD SVG to diagonal 1 SVG to diagonal 2 SVG to OM SVG to PL (RCA) 4.   Endoscopic vein harvest from the right and left leg   Anesthesia:  General Endotracheal   Clinical History/Surgical Indication:  This 63 year old gentleman has severe multivessel coronary disease with progressive anginal symptoms. I agree that coronary artery bypass graft surgery is the best treatment to improve his symptoms and prevent further left ventricular dysfunction. I think his LAD, OM1, and posterolateral branch of the RCA appear graftable. I think one or both of his diagonal branches are probably graftable also and have high-grade ostial stenoses. I reviewed the cardiac catheterization images with the patient and his wife and answered all their questions. I discussed the operative procedure with the patient and his wife including alternatives, benefits and risks; including but not limited to bleeding, blood transfusion, infection, stroke, myocardial infarction, graft failure, heart block requiring a permanent pacemaker, organ dysfunction, and death. Helayne JINNY Hazel understands and agrees to proceed.   Preparation:  The patient was seen in the  preoperative holding area and the correct patient, correct operation were confirmed with the patient after reviewing the medical record and catheterization. The consent was signed by me. Preoperative antibiotics were given. A pulmonary arterial line and radial arterial line were placed by the anesthesia team. The patient was taken back to the operating room and positioned supine on the operating room table. After being placed under general endotracheal anesthesia by the anesthesia team a foley catheter was placed. The neck, chest, abdomen, and both legs were prepped with betadine soap and solution and draped in the usual sterile manner. A surgical time-out was taken and the correct patient and operative procedure were confirmed with the nursing and anesthesia staff.   Cardiopulmonary Bypass:  A median sternotomy was performed. The pericardium was opened in the midline. Right ventricular function appeared normal. The ascending aorta was of normal size and had no palpable plaque. There were no contraindications to aortic cannulation or cross-clamping. The patient was fully systemically heparinized and the ACT was maintained > 400 sec. The proximal aortic arch was cannulated with a 20 F aortic cannula for arterial inflow. Venous cannulation was performed via the right atrial appendage using a two-staged venous cannula. An antegrade cardioplegia/vent cannula was inserted into the mid-ascending aorta. Aortic occlusion was performed with a single cross-clamp. Systemic cooling to 32 degrees Centigrade and topical cooling of the heart with iced saline were used. Hyperkalemic antegrade cold blood cardioplegia was used to induce diastolic arrest and was then given at about 20 minute intervals throughout the period of arrest to maintain myocardial temperature at or below 10 degrees centigrade. A temperature probe was inserted into the interventricular septum and an insulating pad was placed in the pericardium.   Left  internal mammary artery harvest:  The left side of the sternum was retracted using the Rultract  retractor. The left internal mammary artery was harvested as a pedicle graft. All side branches were clipped. It was a medium-sized vessel of good quality with excellent blood flow. It was ligated distally and divided. It was sprayed with topical papaverine  solution to prevent vasospasm.   Endoscopic vein harvest: Performed by Rocky Shad, PA-C  The right greater saphenous vein was harvested endoscopically through a 2 cm incision medial to the right knee. It was harvested from the thigh. Below the knee it was too small. Therefore a second segment of GSV was harvested from the left leg using endoscopically.  It was a medium-sized vein of good quality. The side branches were all ligated with 4-0 silk ties.    Coronary arteries:  The coronary arteries were examined.  LAD:  intramyocardial in proximal and mid portions. Distally it was visible. It was a medium caliber vessel with segmental plaque out to the apex. The D1 was a medium caliber vessel with no distal disease. The D2 was smaller but had no distal disease. LCX:  OM small but graftable.  RCA:  tiny PDA. Large PL branch that had been stented before. Beyond the stent there was no disease.   Grafts: Performed by me and assisted by Rocky Shad, PA-C  LIMA to the LAD: 1.75 mm. It was sewn end to side using 8-0 prolene continuous suture. SVG to D1:  1.6 mm. It was sewn end to side using 7-0 prolene continuous suture. SVG to D2:  1.5 mm. It was sewn end to side using 7-0 prolene continuous suture. SVG to OM:  1.6 mm. It was sewn end to side using 7-0 prolene continuous suture. SVG to PL (RCA):  1.75 mm. It was sewn end to side using 7-0 prolene continuous suture.  The proximal vein graft anastomoses were performed to the mid-ascending aorta using continuous 6-0 prolene suture. Graft markers were placed around the proximal  anastomoses.   Completion:  The patient was rewarmed to 37 degrees Centigrade. The clamp was removed from the LIMA pedicle and there was rapid warming of the septum and return of ventricular fibrillation. The crossclamp was removed with a time of 100 minutes. There was spontaneous return of sinus rhythm. The distal and proximal anastomoses were checked for hemostasis. The position of the grafts was satisfactory. Two temporary epicardial pacing wires were placed on the right atrium and two on the right ventricle. The patient was weaned from CPB without difficulty on no inotropes. CPB time was 118 minutes. Cardiac output was 5 LPM. TEE showed good LV systolic function. Heparin  was fully reversed with protamine  and the aortic and venous cannulas removed. Hemostasis was achieved. Mediastinal and left pleural drainage tubes were placed. The sternum was closed with #6 stainless steel wires. The fascia was closed with continuous # 1 vicryl suture. The subcutaneous tissue was closed with 2-0 vicryl continuous suture. The skin was closed with 3-0 vicryl subcuticular suture. All sponge, needle, and instrument counts were reported correct at the end of the case. Dry sterile dressings were placed over the incisions and around the chest tubes which were connected to pleurevac suction. The patient was then transported to the surgical intensive care unit in stable condition.

## 2023-02-09 NOTE — Plan of Care (Signed)

## 2023-02-09 NOTE — Anesthesia Procedure Notes (Signed)
 Arterial Line Insertion Start/End1/13/2025 6:52 AM, 02/09/2023 7:00 AM Performed by: Darlyn Rush, MD, Claudene Arlin LABOR, CRNA, CRNA  Patient location: Pre-op. Preanesthetic checklist: patient identified, IV checked, site marked, risks and benefits discussed, surgical consent, monitors and equipment checked, pre-op evaluation, timeout performed and anesthesia consent Lidocaine  1% used for infiltration radial was placed Catheter size: 20 G Hand hygiene performed  and maximum sterile barriers used   Attempts: 2 Procedure performed without using ultrasound guided technique. Following insertion, dressing applied. Post procedure assessment: normal and unchanged

## 2023-02-09 NOTE — Interval H&P Note (Signed)
 History and Physical Interval Note:  02/09/2023 6:39 AM  Nicholas [name]  has presented today for surgery, with the diagnosis of CAD.  The various methods of treatment have been discussed with the patient and family. After consideration of risks, benefits and other options for treatment, the patient has consented to  Procedure(s): CORONARY ARTERY BYPASS GRAFTING (CABG) (N/A) TRANSESOPHAGEAL ECHOCARDIOGRAM (TEE) (N/A) as a surgical intervention.  The patient's history has been reviewed, patient examined, no change in status, stable for surgery.  I have reviewed the patient's chart and labs.  Questions were answered to the patient's satisfaction.     [name] K Rolando Whitby

## 2023-02-09 NOTE — Transfer of Care (Signed)
 Immediate Anesthesia Transfer of Care Note  Patient: Nicholas [name]  Procedure(s) Performed: CORONARY ARTERY BYPASS GRAFTING X 5, USING LEFT INTERNAL MAMMARY ARTERY AND ENDOSCOPICALLY HARVESTED LEFT SAPHENOUS VEIN GRAFT (Chest) TRANSESOPHAGEAL ECHOCARDIOGRAM (TEE)  Patient Location: SICU  Anesthesia Type:General  Level of Consciousness: sedated and Patient remains intubated per anesthesia plan  Airway & Oxygen Therapy: Patient remains intubated per anesthesia plan and Patient placed on Ventilator (see vital sign flow sheet for setting)  Post-op Assessment: Report given to RN and Post -op Vital signs reviewed and stable  Post vital signs: Reviewed, stable, and unstable  Last Vitals:  Vitals Value Taken Time  BP 100/69 02/09/23 1326  Temp 36.8 C 02/09/23 1333  Pulse 91 02/09/23 1333  Resp 18 02/09/23 1333  SpO2 100 % 02/09/23 1333  Vitals shown include unfiled device data.  Last Pain:  Vitals:   02/09/23 0631  TempSrc:   PainSc: 0-No pain         Complications: No notable events documented.

## 2023-02-09 NOTE — Anesthesia Procedure Notes (Signed)
 Procedure Name: Intubation Date/Time: 02/09/2023 7:51 AM  Performed by: Elby Raelene SAUNDERS, CRNAPre-anesthesia Checklist: Patient identified, Emergency Drugs available, Suction available and Patient being monitored Patient Re-evaluated:Patient Re-evaluated prior to induction Oxygen Delivery Method: Circle System Utilized Preoxygenation: Pre-oxygenation with 100% oxygen Induction Type: IV induction Ventilation: Mask ventilation without difficulty Laryngoscope Size: Miller and 2 Grade View: Grade I Tube type: Oral Tube size: 8.0 mm Number of attempts: 1 Airway Equipment and Method: Stylet and Oral airway Placement Confirmation: ETT inserted through vocal cords under direct vision, positive ETCO2 and breath sounds checked- equal and bilateral Secured at: 24 cm Tube secured with: Tape Dental Injury: Teeth and Oropharynx as per pre-operative assessment

## 2023-02-09 NOTE — Anesthesia Postprocedure Evaluation (Signed)
 Anesthesia Post Note  Patient: Nicholas [name]  Procedure(s) Performed: CORONARY ARTERY BYPASS GRAFTING X 5, USING LEFT INTERNAL MAMMARY ARTERY AND ENDOSCOPICALLY HARVESTED LEFT SAPHENOUS VEIN GRAFT (Chest) TRANSESOPHAGEAL ECHOCARDIOGRAM (TEE)     Patient location during evaluation: ICU Anesthesia Type: General Level of consciousness: sedated and patient remains intubated per anesthesia plan Pain management: pain level controlled Vital Signs Assessment: post-procedure vital signs reviewed and stable Respiratory status: patient on ventilator - see flowsheet for VS and patient remains intubated per anesthesia plan Cardiovascular status: stable Anesthetic complications: no   No notable events documented.  Last Vitals:  Vitals:   02/09/23 1415 02/09/23 1430  BP:    Pulse: 97 92  Resp: 16 16  Temp: 36.8 C 36.8 C  SpO2: 100% 100%    Last Pain:  Vitals:   02/09/23 0631  TempSrc:   PainSc: 0-No pain                 Norleen Pope

## 2023-02-09 NOTE — Procedures (Signed)
 Extubation Procedure Note  Patient Details:   Name: Nicholas [name] DOB: [birth year] MRN: [id number]   Airway Documentation:    Vent end date: 02/09/23 Vent end time: 1614   Evaluation  O2 sats: stable throughout Complications: No apparent complications Patient did tolerate procedure well. Bilateral Breath Sounds: Rhonchi, Clear   Yes Pt was successfully extubated with no apparent complications at this time. No audible cuffleak was heard prior extubation,Per MD extubate anyways. No signs of stridor at this time. NIF -23 and VC 1.6L. Pt is able to talk and able to protect airway at this time   Nicholas [name] Mater 02/09/2023, 4:40 PM

## 2023-02-09 NOTE — Hospital Course (Addendum)
 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 He has been followed by Dr. Debera and was seen in August at which time he was clinically stable. He is now having more frequent episodes of substernal chest discomfort radiating into his shoulders and down his arms associated with shortness of breath. These have been occurring mostly with exertion but he has had episodes at rest. He had a 2D echocardiogram on 11/04/2022 showing a reduction in his ejection fraction to 40 to 45% with global hypokinesis. His LV diastolic diameter was 5.2 cm. There is no significant valvular abnormality. His ejection fraction by echocardiogram in February 2020 was 60 to 65%. He had a stress nuclear scan on 10/30/2022 that showed prior inferior infarction with no current ischemia. His left ventricular ejection fraction was noted to be 18% with marked enlargement of the LV cavity at end diastole.  He was referred to Dr. Lucas for surgical evaluation.  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  It was felt the patient would benefit from coronary bypass grafting procedure.  The risks and benefits of the procedure were explained to the patient and he was agreeable to proceed.  Hospital Course:  Nicholas [name] presented to [facility] on 02/09/2023.  He was taken to the operating room on and underwent CABG x 5 utilizing LIMA to LAD, SVG to PLB off RCA, SVG to OM, SVG to Diagonal 1, SVG to Diagonal 2.  He also underwent Endoscopic harvest greater saphenous vein from right and left thigh.  He tolerated the procedure without difficulty and was taken to the SICU in stable  condition.  He was weaned from the ventilator and extubated routinely during the afternoon on the day of surgery.  He remained hemodynamically stable although he had a sinus tachycardia early postoperatively.  He was started on metoprolol .  Pacer wires and chest tubes along with the monitoring lines were all removed on the first postoperative day.  He was transition from the insulin  drip to twice daily Levemir  and sliding scale insulin  on postop day 1.  He was mobilized gently and progressed well with ambulation.  The sinus tachycardia persisted but we were unable to uptitrate the metoprolol  due to relative hypotension.  Digoxin  was added for improved rate control.  He was ready for transfer to [facility] on postop day 2.

## 2023-02-09 NOTE — Inpatient Diabetes Management (Signed)
 Inpatient Diabetes Program Recommendations  AACE/ADA: New Consensus Statement on Inpatient Glycemic Control (2015)  Target Ranges:  Prepandial:   less than 140 mg/dL      Peak postprandial:   less than 180 mg/dL (1-2 hours)      Critically ill patients:  140 - 180 mg/dL   Lab Results  Component Value Date   GLUCAP 132 (H) 02/09/2023   HGBA1C 11.0 (H) 02/09/2023    Diabetes history: DM2 Outpatient Diabetes medications: 75/25 60 units bid, Libre 3 CGM Current orders for Inpatient glycemic control: IV insulin   Inpatient Diabetes Program Recommendations:   Noted patient for CABG surgery today. Patient saw Dr. Lenis, endocrinologist 10/24/22 with A1c of 9.6. Current A1c increased to 11.0 (average blood glucose 269 over the past 2-3 months). Will follow and assist as needed.  Thank you, Aalijah Lanphere E. Jammy Stlouis, RN, MSN, CDCES  Diabetes Coordinator Inpatient Glycemic Control Team Team Pager [phone] (8am-5pm) 02/09/2023 1:20 PM

## 2023-02-10 ENCOUNTER — Encounter (HOSPITAL_COMMUNITY): Payer: Self-pay | Admitting: Surgery

## 2023-02-10 ENCOUNTER — Inpatient Hospital Stay (HOSPITAL_COMMUNITY): Payer: BC Managed Care – PPO

## 2023-02-10 LAB — BASIC METABOLIC PANEL
Anion gap: 5 (ref 5–15)
Anion gap: 10 (ref 5–15)
BUN: 9 mg/dL (ref 8–23)
BUN: 14 mg/dL (ref 8–23)
CO2: 24 mmol/L (ref 22–32)
CO2: 24 mmol/L (ref 22–32)
Calcium: 8.1 mg/dL — ABNORMAL LOW (ref 8.9–10.3)
Calcium: 8.2 mg/dL — ABNORMAL LOW (ref 8.9–10.3)
Chloride: 107 mmol/L (ref 98–111)
Chloride: 100 mmol/L (ref 98–111)
Creatinine, Ser: 0.81 mg/dL (ref 0.61–1.24)
Creatinine, Ser: 1.12 mg/dL (ref 0.61–1.24)
GFR, Estimated: >60 mL/min (ref >60)
GFR, Estimated: >60 mL/min (ref >60)
Glucose, Bld: 142 mg/dL — ABNORMAL HIGH (ref 70–99)
Glucose, Bld: 215 mg/dL — ABNORMAL HIGH (ref 70–99)
Potassium: 4.5 mmol/L (ref 3.5–5.1)
Potassium: 4.5 mmol/L (ref 3.5–5.1)
Sodium: 136 mmol/L (ref 135–145)
Sodium: 134 mmol/L — ABNORMAL LOW (ref 135–145)

## 2023-02-10 LAB — CBC
HCT: 36.5 % — ABNORMAL LOW (ref 39.0–52.0)
HCT: 37.9 % — ABNORMAL LOW (ref 39.0–52.0)
Hemoglobin: 11.9 g/dL — ABNORMAL LOW (ref 13.0–17.0)
Hemoglobin: 12 g/dL — ABNORMAL LOW (ref 13.0–17.0)
MCH: 26.6 pg (ref 26.0–34.0)
MCH: 26.2 pg (ref 26.0–34.0)
MCHC: 32.6 g/dL (ref 30.0–36.0)
MCHC: 31.7 g/dL (ref 30.0–36.0)
MCV: 81.5 fL (ref 80.0–100.0)
MCV: 82.8 fL (ref 80.0–100.0)
Platelets: 117 10*3/uL — ABNORMAL LOW (ref 150–400)
Platelets: 126 10*3/uL — ABNORMAL LOW (ref 150–400)
RBC: 4.48 MIL/uL (ref 4.22–5.81)
RBC: 4.58 MIL/uL (ref 4.22–5.81)
RDW: 14.4 % (ref 11.5–15.5)
RDW: 14.4 % (ref 11.5–15.5)
WBC: 10.7 10*3/uL — ABNORMAL HIGH (ref 4.0–10.5)
WBC: 12.6 10*3/uL — ABNORMAL HIGH (ref 4.0–10.5)
nRBC: 0 % (ref 0.0–0.2)
nRBC: 0 % (ref 0.0–0.2)

## 2023-02-10 LAB — GLUCOSE, CAPILLARY
Glucose-Capillary: 142 mg/dL — ABNORMAL HIGH (ref 70–99)
Glucose-Capillary: 144 mg/dL — ABNORMAL HIGH (ref 70–99)
Glucose-Capillary: 145 mg/dL — ABNORMAL HIGH (ref 70–99)
Glucose-Capillary: 154 mg/dL — ABNORMAL HIGH (ref 70–99)
Glucose-Capillary: 161 mg/dL — ABNORMAL HIGH (ref 70–99)
Glucose-Capillary: 171 mg/dL — ABNORMAL HIGH (ref 70–99)
Glucose-Capillary: 207 mg/dL — ABNORMAL HIGH (ref 70–99)
Glucose-Capillary: 216 mg/dL — ABNORMAL HIGH (ref 70–99)
Glucose-Capillary: 236 mg/dL — ABNORMAL HIGH (ref 70–99)

## 2023-02-10 LAB — MAGNESIUM
Magnesium: 2.4 mg/dL (ref 1.7–2.4)
Magnesium: 2.1 mg/dL (ref 1.7–2.4)

## 2023-02-10 MED ORDER — LIVING WELL WITH DIABETES BOOK
Freq: Once | Status: AC
  Filled 2023-02-10: qty 1

## 2023-02-10 MED ORDER — INSULIN GLARGINE-YFGN 100 UNIT/ML ~~LOC~~ SOLN
20.0000 [IU] | Freq: Every day | SUBCUTANEOUS | Status: DC
  Administered 2023-02-10: 20 [IU] via SUBCUTANEOUS
  Filled 2023-02-10 (×2): qty 0.2

## 2023-02-10 MED ORDER — METOPROLOL TARTRATE 25 MG PO TABS
25.0000 mg | ORAL_TABLET | Freq: Two times a day (BID) | ORAL | Status: DC
  Administered 2023-02-10 – 2023-02-12 (×5): 25 mg via ORAL
  Filled 2023-02-10 (×5): qty 1

## 2023-02-10 MED ORDER — SODIUM CHLORIDE 0.9 % IV SOLN: INTRAVENOUS | Status: DC | PRN

## 2023-02-10 MED ORDER — INSULIN ASPART 100 UNIT/ML IJ SOLN: 0.0000 [IU] | INTRAMUSCULAR | Status: DC

## 2023-02-10 MED ORDER — ENOXAPARIN SODIUM 40 MG/0.4ML IJ SOSY
40.0000 mg | PREFILLED_SYRINGE | Freq: Every day | INTRAMUSCULAR | Status: DC
  Administered 2023-02-10 – 2023-02-13 (×4): 40 mg via SUBCUTANEOUS
  Filled 2023-02-10 (×4): qty 0.4

## 2023-02-10 MED ORDER — FUROSEMIDE 10 MG/ML IJ SOLN
40.0000 mg | Freq: Once | INTRAMUSCULAR | Status: AC
  Administered 2023-02-10: 40 mg via INTRAVENOUS
  Filled 2023-02-10: qty 4

## 2023-02-10 MED ORDER — INSULIN ASPART 100 UNIT/ML IJ SOLN
0.0000 [IU] | INTRAMUSCULAR | Status: DC
  Administered 2023-02-10: 4 [IU] via SUBCUTANEOUS
  Administered 2023-02-10: 8 [IU] via SUBCUTANEOUS
  Administered 2023-02-10: 4 [IU] via SUBCUTANEOUS
  Administered 2023-02-10: 8 [IU] via SUBCUTANEOUS
  Administered 2023-02-11: 4 [IU] via SUBCUTANEOUS
  Administered 2023-02-11: 8 [IU] via SUBCUTANEOUS

## 2023-02-10 MED ORDER — METOPROLOL TARTRATE 25 MG/10 ML ORAL SUSPENSION: 25.0000 mg | Freq: Two times a day (BID) | ORAL | Status: DC

## 2023-02-10 MED FILL — Thrombin (Recombinant) For Soln 20000 Unit: CUTANEOUS | Qty: 1 | Status: AC

## 2023-02-10 NOTE — Plan of Care (Signed)
 Pacing wires, chest tubesx3, swan, artline, and foley all removed today with no complications. Introducer left in place. Pt up to chair and ambulated in hallway with Lorren Rossetti and standby assist. Voided post foley removal. Pain controlled with oral pain medication.

## 2023-02-10 NOTE — Progress Notes (Signed)
 EVENING ROUNDS NOTE :     [address]       [place] [address]             [phone]                 1 Day Post-Op Procedure(s) (LRB): CORONARY ARTERY BYPASS GRAFTING X 5, USING LEFT INTERNAL MAMMARY ARTERY AND ENDOSCOPICALLY HARVESTED LEFT SAPHENOUS VEIN GRAFT (N/A) TRANSESOPHAGEAL ECHOCARDIOGRAM (TEE) (N/A)   Total Length of Stay:  LOS: 1 day  Events:   No events Remains tachy    BP 107/70   Pulse (!) 115   Temp 98.2 F (36.8 C) (Oral)   Resp (!) 21   Ht 5' 9 (1.753 m)   Wt 79.3 kg   SpO2 96%   BMI 25.82 kg/m   PAP: (22-35)/(10-19) 30/16 CVP:  [6 mmHg-19 mmHg] 10 mmHg PCWP:  [16 mmHg] 16 mmHg CO:  [4.4 L/min-5.4 L/min] 5.4 L/min CI:  [2.3 L/min/m2-2.8 L/min/m2] 2.8 L/min/m2      sodium chloride       ceFAZolin  (ANCEF ) IV 2 g (02/10/23 1433)   nitroGLYCERIN      phenylephrine  (NEO-SYNEPHRINE) Adult infusion Stopped (02/09/23 1549)    I/O last 3 completed shifts: In: 4061.1 [I.V.:2638.7; Blood:445; IV Piggyback:977.5] Out: 3455 [Urine:2255; Blood:800; Chest Tube:400]      Latest Ref Rng & Units 02/10/2023    5:23 PM 02/10/2023    3:53 AM 02/09/2023    6:48 PM  CBC  WBC 4.0 - 10.5 K/uL 12.6  10.7  12.4   Hemoglobin 13.0 - 17.0 g/dL 87.9  88.0  88.2   Hematocrit 39.0 - 52.0 % 37.9  36.5  35.6   Platelets 150 - 400 K/uL 126  117  116        Latest Ref Rng & Units 02/10/2023    4:01 PM 02/10/2023    3:53 AM 02/09/2023    6:48 PM  BMP  Glucose 70 - 99 mg/dL 784  857  863   BUN 8 - 23 mg/dL 14  9  10    Creatinine 0.61 - 1.24 mg/dL 8.87  9.18  9.27   Sodium 135 - 145 mmol/L 134  136  137   Potassium 3.5 - 5.1 mmol/L 4.5  4.5  4.8   Chloride 98 - 111 mmol/L 100  107  108   CO2 22 - 32 mmol/L 24  24  22    Calcium  8.9 - 10.3 mg/dL 8.2  8.1  8.4     ABG    Component Value Date/Time   PHART 7.329 (L) 02/09/2023 1724   PCO2ART 45.4 02/09/2023 1724   PO2ART 158 (H) 02/09/2023 1724   HCO3 24.0 02/09/2023 1724   TCO2 25 02/09/2023  1724   ACIDBASEDEF 2.0 02/09/2023 1724   O2SAT 99 02/09/2023 1724       Jaidynn Balster, MD 02/10/2023 6:33 PM

## 2023-02-10 NOTE — Inpatient Diabetes Management (Signed)
 Inpatient Diabetes Program Recommendations  AACE/ADA: New Consensus Statement on Inpatient Glycemic Control (2015)  Target Ranges:  Prepandial:   less than 140 mg/dL      Peak postprandial:   less than 180 mg/dL (1-2 hours)      Critically ill patients:  140 - 180 mg/dL   Lab Results  Component Value Date   GLUCAP 171 (H) 02/10/2023   HGBA1C 11.0 (H) 02/09/2023   Spoke with Nicholas [name] and wife about A1C results 11.0 (average blood glucose 269 over the past 2-3 months). with them and explained what an A1C is, basic pathophysiology of DM Type 2, basic home care, basic diabetes diet nutrition principles, importance of checking CBGs and maintaining good CBG control to prevent long-term and short-term complications. Reviewed signs and symptoms of hyperglycemia and hypoglycemia and how to treat hypoglycemia at home. Also reviewed blood sugar goals at home.  RNs to provide ongoing basic DM education at bedside with this patient. Have ordered educational booklet Living Well With Diabetes.  Patient and wife shared that patient has not been taking the am dose of 75/25 due to not eating breakfast. Reviewed with patient and wife patient is able to take insulin  ac lunch when he eats and again when he eats supper late in the evening. Reviewed the 75/25 insulin  includes basal and meal coverage. Discussed option of basal/bolus and patient does not want to do increased number of injections but states willingness to take his 75/25 as prescribed. 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 Also states eats @ the food court and not healthy choices. Reviewed healthy choices with patient and normal A1c range of 6.5-7.0.  Thank you, Akif Weldy E. Gerri Acre, RN, MSN, CDCES  Diabetes Coordinator Inpatient Glycemic Control Team Team Pager [phone] (8am-5pm) 02/10/2023 2:39 PM

## 2023-02-10 NOTE — Progress Notes (Signed)
 [employer] Wires out at [employer]

## 2023-02-10 NOTE — TOC Initial Note (Signed)
 Transition of Care [facility]) - Initial/Assessment Note    Patient Details  Name: Nicholas [name] MRN: [id number] Date of Birth: 03/05/1960  Transition of Care [facility] CM/SW Contact:    Justina Delcia Czar, RN Phone Number: [phone] 02/10/2023, 5:22 PM  Clinical Narrative:                 CM spoke to pt and offered choice for [facility], pt agreeable to [facility] with [facility]. Pt states he was independent pta. No DME needed. 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  Will continue to follow for dc needs.   Expected Discharge Plan: Home w Home Health Services Barriers to Discharge: Continued Medical Work up   Patient Goals and CMS Choice Patient states their goals for this hospitalization and ongoing recovery are:: wants to remain independent CMS Medicare.gov Compare Post Acute Care list provided to:: Patient Choice offered to / list presented to : Patient      Expected Discharge Plan and Services   Discharge Planning Services: CM Consult Post Acute Care Choice: Home Health Living arrangements for the past 2 months: Single Family Home                           HH Arranged: RN, PT [facility] Agency: [facility] Date HH Agency Contacted: 02/10/23 Time HH Agency Contacted: 1721 Representative spoke with at [facility] Agency: Zebedee Braver  Prior Living Arrangements/Services Living arrangements for the past 2 months: Single Family Home Lives with:: Spouse Patient language and need for interpreter reviewed:: Yes Do you feel safe going back to the place where you live?: Yes      Need for Family Participation in Patient Care: No (Comment)     Criminal Activity/Legal Involvement Pertinent to Current Situation/Hospitalization: No - Comment as needed  Activities of Daily Living   ADL Screening (condition at time of admission) Independently performs ADLs?: Yes (appropriate for developmental age) Is the patient deaf or have difficulty hearing?: No Does the patient have difficulty seeing, even when  wearing glasses/contacts?: No Does the patient have difficulty concentrating, remembering, or making decisions?: No  Permission Sought/Granted Permission sought to share information with : Case Manager, Family Supports, PCP Permission granted to share information with : Yes, Verbal Permission Granted  Share Information with NAME: Branko Steeves  Permission granted to share info w AGENCY: Home Health  Permission granted to share info w Relationship: wife  Permission granted to share info w Contact Information: [phone]  Emotional Assessment Appearance:: Appears stated age Attitude/Demeanor/Rapport: Engaged Affect (typically observed): Accepting Orientation: : Oriented to Self, Oriented to Place, Oriented to  Time, Oriented to Situation   Psych Involvement: No (comment)  Admission diagnosis:  Hx of CABG [Z95.1] Patient Active Problem List   Diagnosis Date Noted   Hx of CABG 02/09/2023   Insulin  long-term use (HCC) 10/24/2022   Acute metabolic encephalopathy 05/21/2020   Receptive aphasia    Subdural hematoma (HCC) 05/09/2020   Polypharmacy 06/17/2019   Encounter for screening colonoscopy 06/17/2019   Vitamin D  deficiency 05/10/2019   Diabetic peripheral neuropathy (HCC) 11/24/2018   Bilateral carpal tunnel syndrome 11/24/2018   Acute ischemic stroke (HCC) 03/03/2018   Uncontrolled type 2 diabetes mellitus with hyperglycemia (HCC) 04/21/2017   Hyperlipemia 04/21/2016   Malnutrition of moderate degree 02/11/2016   History of stroke 02/11/2016   TIA (transient ischemic attack) 02/09/2016   Right carotid bruit 02/09/2016   Non-adherence to medical treatment 02/09/2016   Type 2  diabetes mellitus with vascular disease (HCC) 12/12/2014   Essential hypertension, benign 12/12/2014   Hyperlipidemia 12/12/2014   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 [place] [address] Phone: [phone] Fax: [phone]  [facility] [place], [place] - [address] [place] [address] Phone: [phone] Fax: [phone]     Social Drivers of Health (SDOH) Social History: SDOH Screenings   Food Insecurity: No Food Insecurity (02/09/2023)  Housing: Low Risk  (02/09/2023)  Transportation Needs: No Transportation Needs (02/09/2023)  Utilities: Not At Risk (02/09/2023)  Tobacco Use: High Risk (02/09/2023)   SDOH Interventions: Food Insecurity Interventions: Intervention Not Indicated Housing Interventions: Intervention Not Indicated Transportation Interventions: Intervention Not Indicated Utilities Interventions: Intervention Not Indicated   Readmission Risk Interventions     No data to display

## 2023-02-10 NOTE — Plan of Care (Signed)

## 2023-02-10 NOTE — Progress Notes (Signed)
 1 Day Post-Op Procedure(s) (LRB): CORONARY ARTERY BYPASS GRAFTING X 5, USING LEFT INTERNAL MAMMARY ARTERY AND ENDOSCOPICALLY HARVESTED LEFT SAPHENOUS VEIN GRAFT (N/A) TRANSESOPHAGEAL ECHOCARDIOGRAM (TEE) (N/A) Subjective: No complaints.  Objective: Vital signs in last 24 hours: Temp:  [97.9 F (36.6 C)-99.1 F (37.3 C)] 99.1 F (37.3 C) (01/14 0600) Pulse Rate:  [87-122] 115 (01/14 0600) Cardiac Rhythm: Sinus tachycardia (01/14 0000) Resp:  [9-37] 28 (01/14 0600) BP: (93-134)/(65-96) 99/69 (01/14 0600) SpO2:  [96 %-100 %] 97 % (01/14 0600) Arterial Line BP: (94-148)/(52-81) 111/61 (01/14 0600) FiO2 (%):  [40 %-50 %] 40 % (01/13 1515) Weight:  [79.3 kg] 79.3 kg (01/14 0500)  Hemodynamic parameters for last 24 hours: PAP: (18-46)/(-1-25) 23/10 CVP:  [5 mmHg-38 mmHg] 6 mmHg CO:  [3.2 L/min-5.5 L/min] 5.1 L/min CI:  [1.64 L/min/m2-2.83 L/min/m2] 2.62 L/min/m2  Intake/Output from previous day: 01/13 0701 - 01/14 0700 In: 4059.9 [I.V.:2637.4; Blood:445; IV Piggyback:977.5] Out: 3455 [Urine:2255; Blood:800; Chest Tube:400] Intake/Output this shift: Total I/O In: 510.2 [I.V.:98.1; IV Piggyback:412.1] Out: 985 [Urine:800; Chest Tube:185]  General appearance: alert and cooperative Neurologic: intact Heart: regular rate and rhythm Lungs: clear to auscultation bilaterally Extremities: edema mild Wound: dressing dry  Lab Results: Recent Labs    02/09/23 1848 02/10/23 0353  WBC 12.4* 10.7*  HGB 11.7* 11.9*  HCT 35.6* 36.5*  PLT 116* 117*   BMET:  Recent Labs    02/09/23 1848 02/10/23 0353  NA 137 136  K 4.8 4.5  CL 108 107  CO2 22 24  GLUCOSE 136* 142*  BUN 10 9  CREATININE 0.72 0.81  CALCIUM  8.4* 8.1*    PT/INR: No results for input(s): LABPROT, INR in the last 72 hours. ABG    Component Value Date/Time   PHART 7.329 (L) 02/09/2023 1724   HCO3 24.0 02/09/2023 1724   TCO2 25 02/09/2023 1724   ACIDBASEDEF 2.0 02/09/2023 1724   O2SAT 99 02/09/2023 1724    CBG (last 3)  Recent Labs    02/10/23 0201 02/10/23 0356 02/10/23 0559  GLUCAP 144* 142* 154*   CXR: clear  ECG: pending  Assessment/Plan: S/P Procedure(s) (LRB): CORONARY ARTERY BYPASS GRAFTING X 5, USING LEFT INTERNAL MAMMARY ARTERY AND ENDOSCOPICALLY HARVESTED LEFT SAPHENOUS VEIN GRAFT (N/A) TRANSESOPHAGEAL ECHOCARDIOGRAM (TEE) (N/A)  POD 1 Hemodynamically stable in sinus tachy rhythm 110. Will increase Lopressor  to 25 bid.  DC pacing wires and then chest tubes 2 hrs later if stable.  DC arterial line and swan.  Start diuresis.  IS, OOB.  DM: poorly controlled preop with Hgb A1c of 11.0 on Humalog  mix 75/25  60 units twice daily. I suspect dietary non-compliance. Will transition to Levemir  and SSI today.   LOS: 1 day    Nicholas [name] 02/10/2023

## 2023-02-11 ENCOUNTER — Inpatient Hospital Stay (HOSPITAL_COMMUNITY): Payer: BC Managed Care – PPO

## 2023-02-11 LAB — CBC
HCT: 35.9 % — ABNORMAL LOW (ref 39.0–52.0)
Hemoglobin: 11.7 g/dL — ABNORMAL LOW (ref 13.0–17.0)
MCH: 26.6 pg (ref 26.0–34.0)
MCHC: 32.6 g/dL (ref 30.0–36.0)
MCV: 81.6 fL (ref 80.0–100.0)
Platelets: 125 10*3/uL — ABNORMAL LOW (ref 150–400)
RBC: 4.4 MIL/uL (ref 4.22–5.81)
RDW: 14.3 % (ref 11.5–15.5)
WBC: 12.2 10*3/uL — ABNORMAL HIGH (ref 4.0–10.5)
nRBC: 0 % (ref 0.0–0.2)

## 2023-02-11 LAB — BASIC METABOLIC PANEL
Anion gap: 9 (ref 5–15)
BUN: 14 mg/dL (ref 8–23)
CO2: 25 mmol/L (ref 22–32)
Calcium: 8.2 mg/dL — ABNORMAL LOW (ref 8.9–10.3)
Chloride: 100 mmol/L (ref 98–111)
Creatinine, Ser: 1 mg/dL (ref 0.61–1.24)
GFR, Estimated: >60 mL/min (ref >60)
Glucose, Bld: 162 mg/dL — ABNORMAL HIGH (ref 70–99)
Potassium: 4.1 mmol/L (ref 3.5–5.1)
Sodium: 134 mmol/L — ABNORMAL LOW (ref 135–145)

## 2023-02-11 LAB — GLUCOSE, CAPILLARY
Glucose-Capillary: 164 mg/dL — ABNORMAL HIGH (ref 70–99)
Glucose-Capillary: 129 mg/dL — ABNORMAL HIGH (ref 70–99)
Glucose-Capillary: 185 mg/dL — ABNORMAL HIGH (ref 70–99)
Glucose-Capillary: 236 mg/dL — ABNORMAL HIGH (ref 70–99)
Glucose-Capillary: 196 mg/dL — ABNORMAL HIGH (ref 70–99)

## 2023-02-11 MED ORDER — INSULIN GLARGINE-YFGN 100 UNIT/ML ~~LOC~~ SOLN
25.0000 [IU] | Freq: Every day | SUBCUTANEOUS | Status: DC
  Administered 2023-02-11 – 2023-02-14 (×4): 25 [IU] via SUBCUTANEOUS
  Filled 2023-02-11 (×4): qty 0.25

## 2023-02-11 MED ORDER — SODIUM CHLORIDE 0.9% FLUSH
3.0000 mL | Freq: Two times a day (BID) | INTRAVENOUS | Status: DC
  Administered 2023-02-11 – 2023-02-13 (×6): 3 mL via INTRAVENOUS

## 2023-02-11 MED ORDER — ~~LOC~~ CARDIAC SURGERY, PATIENT & FAMILY EDUCATION: Freq: Once | Status: AC

## 2023-02-11 MED ORDER — INSULIN ASPART 100 UNIT/ML IJ SOLN
0.0000 [IU] | Freq: Three times a day (TID) | INTRAMUSCULAR | Status: DC
Start: 2023-02-11 — End: 2023-02-14
  Administered 2023-02-11: 4 [IU] via SUBCUTANEOUS
  Administered 2023-02-11: 8 [IU] via SUBCUTANEOUS
  Administered 2023-02-12 (×2): 2 [IU] via SUBCUTANEOUS
  Administered 2023-02-12: 8 [IU] via SUBCUTANEOUS
  Administered 2023-02-13: 12 [IU] via SUBCUTANEOUS
  Administered 2023-02-13: 8 [IU] via SUBCUTANEOUS
  Administered 2023-02-13: 2 [IU] via SUBCUTANEOUS
  Administered 2023-02-14: 4 [IU] via SUBCUTANEOUS

## 2023-02-11 MED ORDER — SODIUM CHLORIDE 0.9 % IV SOLN: 250.0000 mL | INTRAVENOUS | Status: AC | PRN

## 2023-02-11 MED ORDER — INSULIN ASPART 100 UNIT/ML IJ SOLN
3.0000 [IU] | Freq: Three times a day (TID) | INTRAMUSCULAR | Status: DC
  Administered 2023-02-11 – 2023-02-14 (×9): 3 [IU] via SUBCUTANEOUS

## 2023-02-11 MED ORDER — SODIUM CHLORIDE 0.9% FLUSH: 3.0000 mL | INTRAVENOUS | Status: DC | PRN

## 2023-02-11 MED ORDER — DIGOXIN 0.25 MG/ML IJ SOLN
0.5000 mg | Freq: Once | INTRAMUSCULAR | Status: AC
  Administered 2023-02-11: 0.5 mg via INTRAVENOUS
  Filled 2023-02-11: qty 2

## 2023-02-11 MED FILL — Mannitol IV Soln 20%: INTRAVENOUS | Qty: 500 | Status: AC

## 2023-02-11 MED FILL — Lidocaine HCl Local Soln Prefilled Syringe 100 MG/5ML (2%): INTRAMUSCULAR | Qty: 5 | Status: AC

## 2023-02-11 MED FILL — Sodium Chloride IV Soln 0.9%: INTRAVENOUS | Qty: 1000 | Status: AC

## 2023-02-11 MED FILL — Electrolyte-R (PH 7.4) Solution: INTRAVENOUS | Qty: 3000 | Status: AC

## 2023-02-11 MED FILL — Sodium Bicarbonate IV Soln 8.4%: INTRAVENOUS | Qty: 50 | Status: AC

## 2023-02-11 MED FILL — Heparin Sodium (Porcine) Inj 1000 Unit/ML: INTRAMUSCULAR | Qty: 20 | Status: AC

## 2023-02-11 MED FILL — Calcium Chloride Inj 10%: INTRAVENOUS | Qty: 10 | Status: AC

## 2023-02-11 NOTE — Progress Notes (Signed)
 2 Days Post-Op Procedure(s) (LRB): CORONARY ARTERY BYPASS GRAFTING X 5, USING LEFT INTERNAL MAMMARY ARTERY AND ENDOSCOPICALLY HARVESTED LEFT SAPHENOUS VEIN GRAFT (N/A) TRANSESOPHAGEAL ECHOCARDIOGRAM (TEE) (N/A) Subjective: No complaints.  Objective: Vital signs in last 24 hours: Temp:  [97.7 F (36.5 C)-99.1 F (37.3 C)] 97.7 F (36.5 C) (01/15 0339) Pulse Rate:  [104-131] 126 (01/15 0700) Cardiac Rhythm: Sinus tachycardia (01/15 0400) Resp:  [16-39] 21 (01/15 0700) BP: (85-112)/(63-86) 93/65 (01/15 0700) SpO2:  [87 %-98 %] 95 % (01/15 0700) Arterial Line BP: (109-146)/(53-65) 109/53 (01/14 1100) Weight:  [78.7 kg] 78.7 kg (01/15 0540)  Hemodynamic parameters for last 24 hours: PAP: (26-31)/(13-17) 30/16 CVP:  [7 mmHg-10 mmHg] 10 mmHg PCWP:  [16 mmHg] 16 mmHg CO:  [5.4 L/min] 5.4 L/min CI:  [2.8 L/min/m2] 2.8 L/min/m2  Intake/Output from previous day: 01/14 0701 - 01/15 0700 In: 540.1 [P.O.:240; IV Piggyback:300.1] Out: 1720 [Urine:1640; Chest Tube:80] Intake/Output this shift: No intake/output data recorded.  General appearance: alert and cooperative Neurologic: intact Heart: regular rate and rhythm, tachy Lungs: clear to auscultation bilaterally Extremities: no edema Wound: incision healing well  Lab Results: Recent Labs    02/10/23 1723 02/11/23 0351  WBC 12.6* 12.2*  HGB 12.0* 11.7*  HCT 37.9* 35.9*  PLT 126* 125*   BMET:  Recent Labs    02/10/23 1601 02/11/23 0351  NA 134* 134*  K 4.5 4.1  CL 100 100  CO2 24 25  GLUCOSE 215* 162*  BUN 14 14  CREATININE 1.12 1.00  CALCIUM  8.2* 8.2*    PT/INR: No results for input(s): "LABPROT", "INR" in the last 72 hours. ABG    Component Value Date/Time   PHART 7.329 (L) 02/09/2023 1724   HCO3 24.0 02/09/2023 1724   TCO2 25 02/09/2023 1724   ACIDBASEDEF 2.0 02/09/2023 1724   O2SAT 99 02/09/2023 1724   CBG (last 3)  Recent Labs    02/10/23 2014 02/10/23 2337 02/11/23 0340  GLUCAP 236* 207* 164*    CXR: mild bibasilar atelectasis  Assessment/Plan: S/P Procedure(s) (LRB): CORONARY ARTERY BYPASS GRAFTING X 5, USING LEFT INTERNAL MAMMARY ARTERY AND ENDOSCOPICALLY HARVESTED LEFT SAPHENOUS VEIN GRAFT (N/A) TRANSESOPHAGEAL ECHOCARDIOGRAM (TEE) (N/A)  POD 2  Hemodynamically stable. Rhythm is tachy 120's and regular, looks like sinus on the monitor. Will check ECG to rule out atrial flutter. He is on Lopressor  25 bid but can't increase dose at this time with SBP in the 90's.  Wt is about at preop. Will hold off on further diuresis.  DM: glucose still a little high. Will increase Glargine and continue SSI. Add meal coverage. Preop Hgb A1c was 11.  Continue IS, ambulation.  Transfer to 4E.   LOS: 2 days    Nicholas [name] 02/11/2023

## 2023-02-11 NOTE — Progress Notes (Signed)
 Patient arrived from 2H to 4E.  VSS stable, tachycardia noted by 2H RN.  MD aware.  CCMD called.  No complaints from patient.      02/11/23 1322  Vitals  Temp 97.6 F (36.4 C)  Temp Source Oral  BP 108/72  MAP (mmHg) 83  BP Location Left Arm  BP Method Automatic  Patient Position (if appropriate) Lying  Pulse Rate (!) 122  Pulse Rate Source Monitor  ECG Heart Rate (!) 121  Resp (!) 25  Level of Consciousness  Level of Consciousness Alert  MEWS COLOR  MEWS Score Color Yellow  Oxygen Therapy  SpO2 96 %  O2 Device Nasal Cannula  O2 Flow Rate (L/min) 1 L/min  ECG Monitoring  PR interval 0.19  QRS interval 0.14  QT interval 0.35  QTc interval 0.5  CV Strip Heart Rate 121  Cardiac Rhythm ST;BBB  MEWS Score  MEWS Temp 0  MEWS Systolic 0  MEWS Pulse 2  MEWS RR 1  MEWS LOC 0  MEWS Score 3

## 2023-02-11 NOTE — Plan of Care (Signed)
   Problem: Education: Goal: Knowledge of General Education information will improve Description Including pain rating scale, medication(s)/side effects and non-pharmacologic comfort measures Outcome: Progressing

## 2023-02-11 NOTE — Discharge Instructions (Signed)
Discharge Instructions: ° °1. You may shower, please wash incisions daily with soap and water and keep dry.  If you wish to cover wounds with dressing you may do so but please keep clean and change daily.  No tub baths or swimming until incisions have completely healed.  If your incisions become red or develop any drainage please call our office at [phone] ° °2. No Driving until cleared by [facility] office and you are no longer using narcotic pain medications ° °3. Monitor your weight daily.. Please use the same scale and weigh at same time... If you gain 5-10 lbs in 48 hours with associated lower extremity swelling, please contact our office at [phone] ° °4. Fever of 101.5 for at least 24 hours with no source, please contact our office at [phone] ° °5. Activity- up as tolerated, please walk at least 3 times per day.  Avoid strenuous activity, no lifting, pushing, or pulling with your arms over 8-10 lbs for a minimum of 6 weeks ° °6. If any questions or concerns arise, please do not hesitate to contact our office at [phone] ° ° °

## 2023-02-11 NOTE — Discharge Summary (Signed)
 Physician Discharge Summary  Patient ID: DENA CAMMAROTA MRN: [id number] DOB/AGE: 06-25-1960 63 y.o.  Admit date: 02/09/2023 Discharge date: 02/14/2023  Admission Diagnoses: Coronary artery disease History of multiple coronary artery interventions History of myocardial infarction Type 2 diabetes mellitus on insulin Peripheral neuropathy Tobacco abuse Hypertension Dyslipidemia History of TIA's    Discharge Diagnoses:  Coronary artery disease History of multiple coronary artery interventions History of myocardial infarction Type 2 diabetes mellitus on insulin Peripheral neuropathy Tobacco abuse Hypertension Dyslipidemia History of TIA's S/P CABG x 5 Expected acute blood loss anemia Sinus tachycardia   Discharged Condition: stable  History of Present Illness:  Zahi Conteh is a 63 year old gentleman with a history of type 2 diabetes with diabetic peripheral neuropathy, hyperlipidemia, history of heavy smoking with ongoing 1/2 pack/day smoking, prior stroke in 2018, and coronary disease status post DES to the PLB, BMS to the mid and distal RCA, and DES to the left circumflex and distal LAD in 2010. He has been followed by Dr. Diona Browner and was seen in August at which time he was clinically stable. He is now having more frequent episodes of substernal chest discomfort radiating into his shoulders and down his arms associated with shortness of breath. These have been occurring mostly with exertion but he has had episodes at rest. He had a 2D echocardiogram on 11/04/2022 showing a reduction in his ejection fraction to 40 to 45% with global hypokinesis. His LV diastolic diameter was 5.2 cm. There is no significant valvular abnormality. His ejection fraction by echocardiogram in February 2020 was 60 to 65%. He had a stress nuclear scan on 10/30/2022 that showed prior inferior infarction with no current ischemia. His left ventricular ejection fraction was noted to be 18% with marked  enlargement of the LV cavity at end diastole.  He was referred to Dr. Laneta Simmers for surgical evaluation.  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  It was felt the patient would benefit from coronary bypass grafting procedure.  The risks and benefits of the procedure were explained to the patient and he was agreeable to proceed.  Hospital Course:  Inioluwa Hartness presented to [facility] on 02/09/2023.  He was taken to the operating room on and underwent CABG x 5 utilizing LIMA to LAD, SVG to PLB off RCA, SVG to OM, SVG to Diagonal 1, SVG to Diagonal 2.  He also underwent Endoscopic harvest greater saphenous vein from right and left thigh.  He tolerated the procedure without difficulty and was taken to the SICU in stable condition.  He was weaned from the ventilator and extubated routinely during the afternoon on the day of surgery.  He remained hemodynamically stable although he had a sinus tachycardia early postoperatively.  He was started on metoprolol.  Pacer wires and chest tubes along with the monitoring lines were all removed on the first postoperative day.  He was transition from the insulin drip to twice daily Levemir and sliding scale insulin on postop day 1.  He was mobilized gently and progressed well with ambulation.  The sinus tachycardia persisted and we were able to slowly uptitrate the metoprolol resulting in improved heart rates.   He was ready for transfer to 4E Progressive Care on postop day 2.  Diet and activity were advanced and well-tolerated.  He had return of appropriate bowel and bladder function.  He diuresed satisfactorily to his preoperative weight.  He was ready for discharge home on postop day 5.  Consults: None  Significant Diagnostic Studies:  CLINICAL DATA:  History of CABG.  Follow-up exam.   EXAM: PORTABLE CHEST 1 VIEW   COMPARISON:  Chest radiograph dated 02/10/2023.   FINDINGS: Interval removal of the Swan-Ganz  catheter and placement of a right IJ catheter with tip over upper SVC. Removal of the left-sided chest tube and mediastinal drains.   There is shallow inspiration with bibasilar atelectasis. Trace pleural effusions may be present. No pneumothorax. Stable cardiomediastinal silhouette. Median sternotomy wires and CABG vascular clips. No acute osseous pathology.   IMPRESSION: 1. Right IJ catheter with tip over the upper SVC. No pneumothorax. 2. Bibasilar atelectasis.     Electronically Signed   By: Elgie Collard M.D.   On: 02/11/2023 10:07  Treatments: Surgery  CARDIOVASCULAR SURGERY OPERATIVE NOTE   02/09/2023   Surgeon:  Alleen Borne, MD   First Assistant: Lowella Dandy,  PA-C:   An experienced assistant was required given the complexity of this surgery and the standard of surgical care. The assistant was needed for endoscopic vein harvest, exposure, dissection, suctioning, retraction of delicate tissues and sutures, instrument exchange and for overall help during this procedure.     Preoperative Diagnosis:  Severe multi-vessel coronary artery disease     Postoperative Diagnosis:  Same     Procedure:   Median Sternotomy Extracorporeal circulation 3.   Coronary artery bypass grafting x 5   Left internal mammary artery graft to the LAD SVG to diagonal 1 SVG to diagonal 2 SVG to OM SVG to PL (RCA) 4.   Endoscopic vein harvest from the right and left leg     Anesthesia:  General Endotracheal     Clinical History/Surgical Indication:   This 63 year old gentleman has severe multivessel coronary disease with progressive anginal symptoms. I agree that coronary artery bypass graft surgery is the best treatment to improve his symptoms and prevent further left ventricular dysfunction. I think his LAD, OM1, and posterolateral branch of the RCA appear graftable. I think one or both of his diagonal branches are probably graftable also and have high-grade ostial stenoses. I  reviewed the cardiac catheterization images with the patient and his wife and answered all their questions. I discussed the operative procedure with the patient and his wife including alternatives, benefits and risks; including but not limited to bleeding, blood transfusion, infection, stroke, myocardial infarction, graft failure, heart block requiring a permanent pacemaker, organ dysfunction, and death. [name] understands and agrees to proceed.   Discharge Exam: Blood pressure 112/66, pulse (!) 103, temperature 98 F (36.7 C), temperature source Oral, resp. rate 20, height 5\' 9"  (1.753 m), weight 75.7 kg, SpO2 95%.  General appearance: alert, cooperative, and no distress Neurologic: intact Heart: Sinus tachycardia with improved rates Lungs: breath sounds clear, normal work of breathing Abdomen: soft, no tenderness Extremities: the EVH incisions are intact and dry. No peripheral edema Wound: the sternotomy incision is intact and dry  Disposition:   Discharge Instructions     Amb Referral to Cardiac Rehabilitation   Complete by: As directed    Diagnosis: CABG   CABG X ___: 5   After initial evaluation and assessments completed: Virtual Based Care may be provided alone or in conjunction with Phase 2 Cardiac Rehab based on patient barriers.: Yes   Intensive Cardiac Rehabilitation (ICR) [facility] location only OR Traditional Cardiac Rehabilitation (TCR) *If criteria for ICR are not met will enroll in TCR [facility] only): Yes      Allergies as of  02/14/2023       Reactions   Keppra [levetiracetam]    Hallucination   Topamax [topiramate]    irritable        Medication List     STOP taking these medications    Entresto 24-26 MG Generic drug: sacubitril-valsartan   metoprolol succinate 25 MG 24 hr tablet Commonly known as: TOPROL-XL   traMADol 50 MG tablet Commonly known as: ULTRAM       TAKE these medications    acetaminophen 500 MG tablet Commonly known as:  TYLENOL Take 500 mg by mouth every 6 (six) hours as needed (for headaches).   albuterol 108 (90 Base) MCG/ACT inhaler Commonly known as: VENTOLIN HFA Inhale 1-2 puffs into the lungs every 6 (six) hours as needed for wheezing or shortness of breath.   aspirin EC 325 MG tablet Take 1 tablet (325 mg total) by mouth daily. What changed:  medication strength how much to take additional instructions   BD Pen Needle Nano U/F 32G X 4 MM Misc Generic drug: Insulin Pen Needle USE AS DIRECTED FOUR TIMES DAILY.   CoQ10 100 MG Caps Take 100 mg by mouth daily.   ezetimibe 10 MG tablet Commonly known as: ZETIA Take 1 tablet (10 mg total) by mouth daily.   FreeStyle Libre 2 Sensor Misc USE 1 EVERY 14 DAYS What changed: See the new instructions.   FreeStyle Libre 3 Reader Devi 1 Piece by Does not apply route once as needed for up to 1 dose.   gabapentin 100 MG capsule Commonly known as: NEURONTIN Take 100 mg by mouth daily.   Insulin Lispro Prot & Lispro (75-25) 100 UNIT/ML Kwikpen Commonly known as: HumaLOG Mix 75/25 KwikPen Inject 60 Units into the skin 2 (two) times daily with a meal. What changed:  when to take this additional instructions   metoprolol tartrate 50 MG tablet Commonly known as: LOPRESSOR Take 1 tablet (50 mg total) by mouth 2 (two) times daily.   nicotine 7 mg/24hr patch Commonly known as: NICODERM CQ - dosed in mg/24 hr Place 1 patch (7 mg total) onto the skin daily.   nitroGLYCERIN 0.4 MG SL tablet Commonly known as: NITROSTAT DISSOLVE ONE TABLET UNDER THE TONGUE EVERY 5 MINUTES AS NEEDED FOR CHEST PAIN.  DO NOT EXCEED A TOTAL OF 3 DOSES IN 15 MINUTES   nortriptyline 10 MG capsule Commonly known as: PAMELOR Take 4 capsules (40 mg total) by mouth at bedtime.   oxyCODONE 5 MG immediate release tablet Commonly known as: Oxy IR/ROXICODONE Take 1 tablet (5 mg total) by mouth every 4 (four) hours as needed for up to 7 days for severe pain (pain score  7-10).   pantoprazole 40 MG tablet Commonly known as: PROTONIX Take 40 mg by mouth daily before breakfast.   rosuvastatin 40 MG tablet Commonly known as: CRESTOR Take 40 mg by mouth at bedtime.   Ubrelvy 100 MG Tabs Generic drug: Ubrogepant Take 1 tablet (100 mg total) by mouth daily as needed. Take one tablet at onset of headache, may repeat 1 tablet in 2 hours, no more than 2 tablets in 24 hours               Durable Medical Equipment  (From admission, onward)           Start     Ordered   02/13/23 0818  For home use only DME Walker rolling  Once       Question Answer Comment  Walker: With  5 Inch Wheels   Patient needs a walker to treat with the following condition Physical deconditioning      02/13/23 0817            Follow-up Information     [facility]. Go on 02/19/2023.   Specialty: Cardiothoracic Surgery Why: Your appointment for suture removal is at 10:30am. Contact information: [address] [place], [address] [place] [address] [phone]        [facility]. Go on 03/11/2023.   Specialty: Cardiology Why: Your cardiology follow up appt is at 10:40am. Contact information: [address] [place] [address] [phone]        Alleen Borne, MD. Go on 03/18/2023.   Specialty: Cardiothoracic Surgery Why: Your follow up appointment with Dr. Laneta Simmers is at 2:30pm. Please obtain a chest x-ray 1 hour before  the appointment at [facility] located at [address] Contact information: [address] [employer] [address] [place] [address] [phone]         [facility]. Go on 03/18/2023.   Why: Chest-x-ray at 1:30pm. Contact information: [address] [place] [address]                The patient has been discharged on:   1.Beta Blocker:  Yes [ x  ]                              No   [   ]                               If No, reason:  2.Ace Inhibitor/ARB: Yes [   ]                                     No  [ x   ]                                     If No, reason: Hypotension  3.Statin:   Yes [ x  ]                  No  [   ]                  If No, reason:  4.Ecasa:  Yes  [  x ]                  No   [   ]                  If No, reason:  5. ACS on Admission? No  P2Y12 Inhibitor:  Yes  [   ]                                No  [ x ]    Signed: Leary Roca, PA-C 02/14/2023, 8:02 AM

## 2023-02-12 ENCOUNTER — Other Ambulatory Visit: Payer: Self-pay | Admitting: "Endocrinology

## 2023-02-12 LAB — GLUCOSE, CAPILLARY
Glucose-Capillary: 142 mg/dL — ABNORMAL HIGH (ref 70–99)
Glucose-Capillary: 241 mg/dL — ABNORMAL HIGH (ref 70–99)
Glucose-Capillary: 218 mg/dL — ABNORMAL HIGH (ref 70–99)
Glucose-Capillary: 226 mg/dL — ABNORMAL HIGH (ref 70–99)

## 2023-02-12 LAB — MAGNESIUM: Magnesium: 2.1 mg/dL (ref 1.7–2.4)

## 2023-02-12 MED ORDER — EZETIMIBE 10 MG PO TABS
10.0000 mg | ORAL_TABLET | Freq: Every day | ORAL | Status: DC
  Administered 2023-02-12 – 2023-02-14 (×3): 10 mg via ORAL
  Filled 2023-02-12 (×3): qty 1

## 2023-02-12 MED ORDER — METOPROLOL TARTRATE 25 MG PO TABS
37.5000 mg | ORAL_TABLET | Freq: Two times a day (BID) | ORAL | Status: DC
  Administered 2023-02-12 – 2023-02-13 (×3): 37.5 mg via ORAL
  Filled 2023-02-12 (×4): qty 1

## 2023-02-12 MED ORDER — METOPROLOL TARTRATE 5 MG/5ML IV SOLN
2.5000 mg | Freq: Once | INTRAVENOUS | Status: AC
  Administered 2023-02-12: 2.5 mg via INTRAVENOUS
  Filled 2023-02-12: qty 5

## 2023-02-12 NOTE — Inpatient Diabetes Management (Signed)
Inpatient Diabetes Program Recommendations  AACE/ADA: New Consensus Statement on Inpatient Glycemic Control (2015)  Target Ranges:  Prepandial:   less than 140 mg/dL      Peak postprandial:   less than 180 mg/dL (1-2 hours)      Critically ill patients:  140 - 180 mg/dL   Lab Results  Component Value Date   GLUCAP 142 (H) 02/12/2023   HGBA1C 11.0 (H) 02/09/2023    Review of Glycemic Control  Latest Reference Range & Units 02/11/23 08:09 02/11/23 11:53 02/11/23 17:45 02/11/23 20:59 02/12/23 06:08  Glucose-Capillary 70 - 99 mg/dL 782 (H) 956 (H) 213 (H) 196 (H) 142 (H)  (H): Data is abnormally high  Diabetes history: DM2 Outpatient Diabetes medications: 75/25 60 units with dinner Current orders for Inpatient glycemic control: Semglee 25 units every day, Novolog 0-24 units TID, Novolog 3 units TID  Inpatient Diabetes Program Recommendations:    Might consider discontinuing TCTS scale Novolog 0-24 TID and ordered Novolog 0-15 units TID and 0-5 units at bedtime.  Will continue to follow while inpatient.  Thank you, Dulce Sellar, MSN, CDCES Diabetes Coordinator Inpatient Diabetes Program [phone] (team pager from 8a-5p)

## 2023-02-12 NOTE — Evaluation (Signed)
Physical Therapy Evaluation Patient Details Name: Nicholas [name] MRN: [id number] DOB: 13-Jul-1960 Today's Date: 02/12/2023  History of Present Illness  Pt is 63 yo male who presents on 02/08/23 for CABG and TEE due to increasing chest pain and SOB.  PMH: CVA with hemorrhage 2018, DM, neuropathy, back pain, emphysema, current smoker, CAD with DES  Clinical Impression  Pt admitted with above diagnosis. Pt from home with wife where he and his wife are both independent and working. Pt educated on sternal precautions and given handout and verbalized good understanding. Pt ambulated 120' with RW with HR elevation to 125 bpm from 120 bpm in standing and SPO2 96% on RA. Will not need PT at d/c but encouraged cardiac rehab.  Will follow acutely to work on transfers and gait as pt had difficulty rising from chair without use of UE's. Pt currently with functional limitations due to the deficits listed below (see PT Problem List). Pt will benefit from acute skilled PT to increase their independence and safety with mobility to allow discharge.           If plan is discharge home, recommend the following: Assist for transportation;Assistance with cooking/housework   Can travel by private Tax inspector (2 wheels)  Recommendations for Other Services       Functional Status Assessment Patient has had a recent decline in their functional status and demonstrates the ability to make significant improvements in function in a reasonable and predictable amount of time.     Precautions / Restrictions Precautions Precautions: Sternal Precaution Booklet Issued: Yes (comment) Precaution Comments: reviewed sternal precautions with pt and his 2 kids Restrictions Weight Bearing Restrictions Per Provider Order: Yes RUE Weight Bearing Per Provider Order: Partial weight bearing RUE Partial Weight Bearing Percentage or Pounds: sternal prec LUE Weight Bearing Per Provider  Order: Partial weight bearing LUE Partial Weight Bearing Percentage or Pounds: sternal prec      Mobility  Bed Mobility               General bed mobility comments: pt received in chair, verbally reviewed proper bed mobility with sternal prec    Transfers Overall transfer level: Needs assistance Equipment used: None Transfers: Sit to/from Stand Sit to Stand: Contact guard assist           General transfer comment: pt needed CGA and increased time due to difficulty rising without use of UE's.    Ambulation/Gait Ambulation/Gait assistance: Contact guard assist Gait Distance (Feet): 120 Feet Assistive device: Rolling walker (2 wheels) Gait Pattern/deviations: Step-through pattern, Decreased stride length Gait velocity: decreased Gait velocity interpretation: <1.8 ft/sec, indicate of risk for recurrent falls   General Gait Details: vc's for forward gaze. HR 125 bpm (from 120 bpm in static standing). SPO2 96% on RA  Stairs            Wheelchair Mobility     Tilt Bed    Modified Rankin (Stroke Patients Only)       Balance Overall balance assessment: Mild deficits observed, not formally tested                                           Pertinent Vitals/Pain Pain Assessment Pain Assessment: No/denies pain    Home Living Family/patient expects to be discharged to:: Private residence Living Arrangements: Spouse/significant other Available Help  at Discharge: Family;Available PRN/intermittently Type of Home: House Home Access: Stairs to enter Entrance Stairs-Rails: Left Entrance Stairs-Number of Steps: 8   Home Layout: One level Home Equipment: None      Prior Function Prior Level of Function : Independent/Modified Independent;Driving;Working/employed             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 ADLs Comments: independent     Extremity/Trunk Assessment   Upper Extremity Assessment Upper  Extremity Assessment:  (NT due to sternal prec,)    Lower Extremity Assessment Lower Extremity Assessment: Generalized weakness    Cervical / Trunk Assessment Cervical / Trunk Assessment:  (forward head posture)  Communication   Communication Communication: No apparent difficulties Cueing Techniques: Verbal cues  Cognition Arousal: Alert Behavior During Therapy: WFL for tasks assessed/performed Overall Cognitive Status: Within Functional Limits for tasks assessed                                          General Comments General comments (skin integrity, edema, etc.): pt able to teach back sternal precautions and answer "can you do this" scenarios correctly. Discussed smoking cessation. Encouraged cardiac rehab    Exercises     Assessment/Plan    PT Assessment Patient needs continued PT services  PT Problem List Decreased strength;Decreased activity tolerance       PT Treatment Interventions DME instruction;Gait training;Stair training;Functional mobility training;Therapeutic activities;Therapeutic exercise;Balance training;Patient/family education    PT Goals (Current goals can be found in the Care Plan section)  Acute Rehab PT Goals Patient Stated Goal: return to home and work PT Goal Formulation: With patient/family Time For Goal Achievement: 02/26/23 Potential to Achieve Goals: Good    Frequency Min 1X/week     Co-evaluation               AM-PAC PT "6 Clicks" Mobility  Outcome Measure Help needed turning from your back to your side while in a flat bed without using bedrails?: None Help needed moving from lying on your back to sitting on the side of a flat bed without using bedrails?: None Help needed moving to and from a bed to a chair (including a wheelchair)?: A Little Help needed standing up from a chair using your arms (e.g., wheelchair or bedside chair)?: A Little Help needed to walk in hospital room?: A Little Help needed climbing  3-5 steps with a railing? : A Little 6 Click Score: 20    End of Session   Activity Tolerance: Patient tolerated treatment well Patient left: in chair;with call bell/phone within reach;with family/visitor present Nurse Communication: Mobility status PT Visit Diagnosis: Unsteadiness on feet (R26.81)    Time: 1324-4010 PT Time Calculation (min) (ACUTE ONLY): 27 min   Charges:   PT Evaluation $PT Eval Moderate Complexity: 1 Mod PT Treatments $Gait Training: 8-22 mins PT General Charges $$ ACUTE PT VISIT: 1 Visit         Lyanne Co, PT  Acute Rehab Services Secure chat preferred Office [phone]   Lawana Chambers Dima Ferrufino 02/12/2023, 1:54 PM

## 2023-02-12 NOTE — Progress Notes (Addendum)
      [address]       [place] [address]             [phone]      3 Days Post-Op Procedure(s) (LRB): CORONARY ARTERY BYPASS GRAFTING X 5, USING LEFT INTERNAL MAMMARY ARTERY AND ENDOSCOPICALLY HARVESTED LEFT SAPHENOUS VEIN GRAFT (N/A) TRANSESOPHAGEAL ECHOCARDIOGRAM (TEE) (N/A) Subjective: Transferred from ICU yesterday.  Up in the bedside chair, feels he is progressing. No new concerns.   O2 at 2L/[place]  Objective: Vital signs in last 24 hours: Temp:  [97.6 F (36.4 C)-97.9 F (36.6 C)] 97.8 F (36.6 C) (01/16 0743) Pulse Rate:  [110-132] 126 (01/16 0743) Cardiac Rhythm: Sinus tachycardia;Bundle branch block (01/15 2001) Resp:  [20-36] 21 (01/16 0743) BP: (102-120)/(70-84) 111/70 (01/16 0743) SpO2:  [92 %-98 %] 97 % (01/16 0743) Weight:  [76.9 kg] 76.9 kg (01/16 0607)     Intake/Output from previous day: 01/15 0701 - 01/16 0700 In: 120 [P.O.:120] Out: 375 [Urine:375] Intake/Output this shift: No intake/output data recorded.  General appearance: alert, cooperative, and no distress Neurologic: intact Heart: Sinus tachycardia Lungs: breath sounds clear, normal work of breathing Abdomen: soft, no tenderness Extremities: the EVH incisions are intact and dry. No peripheral edema Wound: the sternotomy incision is intact and dry  Lab Results: Recent Labs    02/10/23 1723 02/11/23 0351  WBC 12.6* 12.2*  HGB 12.0* 11.7*  HCT 37.9* 35.9*  PLT 126* 125*   BMET:  Recent Labs    02/10/23 1601 02/11/23 0351  NA 134* 134*  K 4.5 4.1  CL 100 100  CO2 24 25  GLUCOSE 215* 162*  BUN 14 14  CREATININE 1.12 1.00  CALCIUM 8.2* 8.2*    PT/INR: No results for input(s): "LABPROT", "INR" in the last 72 hours. 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 On ASA, metoprolol, Crestor.    -Tachycardia- Looks like sinus tach, will try increasing the metoprolol if BP will tolerate.   -ENDO- history of poorly controlled DM  (HgbA1C 11).  CBG's 140's- 236.  On Semglee 25units daily along with  Novalog 3 units with meals. Being followed by diabetes coordinator.  -HEME- mild ABL anemia, Hct stable.   -PULM- history of heavy tobacco use. Maintaining sats on 2L O2.  Continue working on [employer].   -Dispo- planning for eventual discharge to home.    LOS: 3 days    Leary Roca, PA-C 02/12/2023   Chart reviewed, patient examined, agree with above.  He feels well, off oxygen. Feels like his bowels will move today. Rhythm is sinus tachy 110.  Titrating up beta blocker.

## 2023-02-12 NOTE — Plan of Care (Signed)

## 2023-02-12 NOTE — Progress Notes (Addendum)
CARDIAC REHAB PHASE I   PRE:  Rate/Rhythm: 112 ST  BP:  Sitting: 103/66      SpO2: 96 1L  MODE:  Ambulation: 130 ft    POST:  Rate/Rhythm: 115 ST  BP:  Sitting: 122/73      SpO2: 97 1L  Pt agreeable to ambulation in the hallway. Pt stood with x2 attempts and moderate assistance while following sternal precautions. Pt walked with RW, 1L O2, and standby assistance. Pt walked at slow steady pace, SpO2 97% while walking. Pt returned to room and helped back into b ed and educated with family present.   Pt received OHS book and education on restrictions, heart healthy and diabetic diet, ex guidelines, Move in the Tube sheet, incentive spirometer use when d/c and CRPII. Pt denies questions and was encouraged to look in the book for additional information. Referral placed to [facility].   Faustino Congress  MS, ACSM-CEP 9:58 AM 02/12/2023    Service time is from 0923 to [phone].

## 2023-02-13 ENCOUNTER — Other Ambulatory Visit (HOSPITAL_COMMUNITY): Payer: Self-pay

## 2023-02-13 LAB — GLUCOSE, CAPILLARY
Glucose-Capillary: 235 mg/dL — ABNORMAL HIGH (ref 70–99)
Glucose-Capillary: 274 mg/dL — ABNORMAL HIGH (ref 70–99)
Glucose-Capillary: 149 mg/dL — ABNORMAL HIGH (ref 70–99)
Glucose-Capillary: 152 mg/dL — ABNORMAL HIGH (ref 70–99)

## 2023-02-13 LAB — MAGNESIUM: Magnesium: 2.2 mg/dL (ref 1.7–2.4)

## 2023-02-13 MED ORDER — NICOTINE 7 MG/24HR TD PT24
7.0000 mg | MEDICATED_PATCH | Freq: Every day | TRANSDERMAL | Status: DC
  Administered 2023-02-13 – 2023-02-14 (×2): 7 mg via TRANSDERMAL
  Filled 2023-02-13 (×2): qty 1

## 2023-02-13 NOTE — Progress Notes (Signed)
Mobility Specialist Progress Note:    02/13/23 1232  Mobility  Activity Ambulated with assistance in hallway  Level of Assistance Contact guard assist, steadying assist  Assistive Device Front wheel walker  Distance Ambulated (ft) 120 ft  RUE Weight Bearing Per Provider Order PWB  LUE Weight Bearing Per Provider Order [facility]  Activity Response Tolerated well  Mobility Referral Yes  Mobility visit 1 Mobility  Mobility Specialist Start Time (ACUTE ONLY) 1052  Mobility Specialist Stop Time (ACUTE ONLY) 1108  Mobility Specialist Time Calculation (min) (ACUTE ONLY) 16 min   Received pt in chair having no complaints and agreeable to mobility. Pt c/o mild fatigue during ambulation causing distance to be shorter. Returned to room w/o fault. Left in bed w/ call bell in reach and all needs met.   Thompson Grayer Mobility Specialist  Please contact vis Secure Chat or  Rehab Office [phone]

## 2023-02-13 NOTE — Plan of Care (Signed)
  Problem: Clinical Measurements: Goal: Will remain free from infection Outcome: Progressing   Problem: Clinical Measurements: Goal: Diagnostic test results will improve Outcome: Progressing   Problem: Clinical Measurements: Goal: Respiratory complications will improve Outcome: Progressing   Problem: Elimination: Goal: Will not experience complications related to bowel motility Outcome: Progressing Goal: Will not experience complications related to urinary retention Outcome: Progressing   Problem: Skin Integrity: Goal: Risk for impaired skin integrity will decrease Outcome: Progressing   Problem: Clinical Measurements: Goal: Postoperative complications will be avoided or minimized Outcome: Progressing   Problem: Urinary Elimination: Goal: Ability to achieve and maintain adequate renal perfusion and functioning will improve Outcome: Progressing

## 2023-02-13 NOTE — Progress Notes (Addendum)
      [address]       [place] [address]             [phone]      4 Days Post-Op Procedure(s) (LRB): CORONARY ARTERY BYPASS GRAFTING X 5, USING LEFT INTERNAL MAMMARY ARTERY AND ENDOSCOPICALLY HARVESTED LEFT SAPHENOUS VEIN GRAFT (N/A) TRANSESOPHAGEAL ECHOCARDIOGRAM (TEE) (N/A) Subjective:  Up in the bedside chair, finished breakfast. BM yesterday.  No new concerns.   On RA  Objective: Vital signs in last 24 hours: Temp:  [96.9 F (36.1 C)-97.6 F (36.4 C)] 96.9 F (36.1 C) (01/16 2307) Pulse Rate:  [102-122] 102 (01/16 2307) Cardiac Rhythm: Sinus tachycardia (01/16 2010) Resp:  [20-33] 20 (01/16 2307) BP: (105-111)/(65-79) 111/74 (01/16 2307) SpO2:  [93 %-96 %] 96 % (01/16 2307) Weight:  [76.4 kg] 76.4 kg (01/17 0500)     Intake/Output from previous day: No intake/output data recorded. Intake/Output this shift: No intake/output data recorded.  General appearance: alert, cooperative, and no distress Neurologic: intact Heart: Sinus tachycardia (rate improved from 120's to 110's)  Lungs: breath sounds clear, normal work of breathing Abdomen: soft, no tenderness Extremities: the EVH incisions are intact and dry. No peripheral edema Wound: the sternotomy incision is intact and dry  Lab Results: Recent Labs    02/10/23 1723 02/11/23 0351  WBC 12.6* 12.2*  HGB 12.0* 11.7*  HCT 37.9* 35.9*  PLT 126* 125*   BMET:  Recent Labs    02/10/23 1601 02/11/23 0351  NA 134* 134*  K 4.5 4.1  CL 100 100  CO2 24 25  GLUCOSE 215* 162*  BUN 14 14  CREATININE 1.12 1.00  CALCIUM 8.2* 8.2*    PT/INR: No results for input(s): "LABPROT", "INR" in the last 72 hours. 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 On ASA, metoprolol, Crestor.    -Tachycardia- rate improved a little from yesterday with increase in metoprolol. BP ~100 today so will not increase the BB any further for now. Adding a nicotine patch in case tachycardia is from nicotine withdrawal.   -ENDO- history of poorly controlled DM  (HgbA1C 11).  CBG's in the 200's  On Semglee 25units daily along with  Novalog 3 units with meals. Being followed by diabetes coordinator.  -HEME- mild ABL anemia, Hct stable.   -PULM- history of heavy tobacco use. Maintaining sats on RA.  Continue working on [employer].   -Dispo- anticipate he will be ready for discharge tomorrow.     LOS: 4 days    Leary Roca, PA-C 02/13/2023   Chart reviewed, patient examined, agree with above.  Overall doing well. Still has sinus tachy but lower with increased BB. BP will not allow further increase in BB dose. Plan home tomorrow if no changes.

## 2023-02-13 NOTE — Inpatient Diabetes Management (Signed)
Inpatient Diabetes Program Recommendations  AACE/ADA: New Consensus Statement on Inpatient Glycemic Control (2015)  Target Ranges:  Prepandial:   less than 140 mg/dL      Peak postprandial:   less than 180 mg/dL (1-2 hours)      Critically ill patients:  140 - 180 mg/dL   Lab Results  Component Value Date   GLUCAP 235 (H) 02/13/2023   HGBA1C 11.0 (H) 02/09/2023    Latest Reference Range & Units 02/12/23 06:08 02/12/23 11:18 02/12/23 16:24 02/12/23 21:50 02/13/23 06:25  Glucose-Capillary 70 - 99 mg/dL 540 (H) 981 (H) 191 (H) 226 (H) 235 (H)  (H): Data is abnormally high  Diabetes history: DM2 Outpatient Diabetes medications: 75/25 60 units with dinner Current orders for Inpatient glycemic control: Semglee 25 units every day, Novolog 0-24 units TID, Novolog 3 units TID  Inpatient Diabetes Program Recommendations:   Noted postprandial CBGs >200. Please consider: -Increase Novolog meal coverage to 6 units tid If eats 50% meals -Decrease Novolog correction to 0-15 units tid, 0-5 units hs  Thank you, Darel Hong E. Maysa Lynn, RN, MSN, CDCES  Diabetes Coordinator Inpatient Glycemic Control Team Team Pager [phone] (8am-5pm) 02/13/2023 9:59 AM

## 2023-02-13 NOTE — Plan of Care (Signed)
  Problem: Education: Goal: Knowledge of General Education information will improve Description: Including pain rating scale, medication(s)/side effects and non-pharmacologic comfort measures Outcome: Progressing   Problem: Clinical Measurements: Goal: Ability to maintain clinical measurements within normal limits will improve Outcome: Progressing Goal: Will remain free from infection Outcome: Progressing Goal: Diagnostic test results will improve Outcome: Progressing Goal: Respiratory complications will improve Outcome: Progressing Goal: Cardiovascular complication will be avoided Outcome: Progressing   Problem: Activity: Goal: Risk for activity intolerance will decrease Outcome: Progressing   Problem: Nutrition: Goal: Adequate nutrition will be maintained Outcome: Progressing   Problem: Coping: Goal: Level of anxiety will decrease Outcome: Progressing   Problem: Elimination: Goal: Will not experience complications related to urinary retention Outcome: Progressing   Problem: Pain Management: Goal: General experience of comfort will improve Outcome: Progressing   Problem: Safety: Goal: Ability to remain free from injury will improve Outcome: Progressing   Problem: Skin Integrity: Goal: Risk for impaired skin integrity will decrease Outcome: Progressing

## 2023-02-13 NOTE — TOC Progression Note (Signed)
Transition of Care (TOC) - Progression Note  Sander Radon, BSN Transitions of Care Unit 4E- RN Case Manager See Treatment Team for direct phone #   Patient Details  Name: Nicholas [name] MRN: [id number] Date of Birth: July 05, 1960  Transition of Care [facility]) CM/SW Contact  Zenda Alpers Lenn Sink, RN Phone Number: 02/13/2023, 2:05 PM  Clinical Narrative:    Follow up done with pt and family at the bedside for [facility] and DME needs on transition home. Note PT notes indicate no PT f/u for discharge. Order in for DME- RW.   Per pt and family- pt has RW to use at home- no RW needed- declined any other DME needs at this time.   Also discussed HH needs- pt voiced that he does not feel he will need any HH follow up at time of discharge.   Family will transport home, no further TOC needs noted at this time.    Expected Discharge Plan: Home w Home Health Services Barriers to Discharge: Continued Medical Work up  Expected Discharge Plan and Services   Discharge Planning Services: CM Consult Post Acute Care Choice: Home Health Living arrangements for the past 2 months: Single Family Home                 DME Arranged: Walker rolling, Patient refused services DME Agency: NA       HH Arranged: Charity fundraiser, PT HH Agency: [facility] Date HH Agency Contacted: 02/10/23 Time HH Agency Contacted: 1721 Representative spoke with at [facility] Agency: Clarise Cruz   Social Determinants of Health (SDOH) Interventions SDOH Screenings   Food Insecurity: No Food Insecurity (02/09/2023)  Housing: Low Risk  (02/09/2023)  Transportation Needs: No Transportation Needs (02/09/2023)  Utilities: Not At Risk (02/09/2023)  Tobacco Use: High Risk (02/09/2023)    Readmission Risk Interventions     No data to display

## 2023-02-14 LAB — GLUCOSE, CAPILLARY: Glucose-Capillary: 182 mg/dL — ABNORMAL HIGH (ref 70–99)

## 2023-02-14 LAB — MAGNESIUM: Magnesium: 2 mg/dL (ref 1.7–2.4)

## 2023-02-14 MED ORDER — NICOTINE 7 MG/24HR TD PT24: 7.0000 mg | MEDICATED_PATCH | Freq: Every day | TRANSDERMAL | 0 refills | Status: DC

## 2023-02-14 MED ORDER — ASPIRIN 325 MG PO TBEC: 325.0000 mg | DELAYED_RELEASE_TABLET | Freq: Every day | ORAL | 4 refills | Status: AC

## 2023-02-14 MED ORDER — OXYCODONE HCL 5 MG PO TABS: 5.0000 mg | ORAL_TABLET | ORAL | 0 refills | Status: AC | PRN

## 2023-02-14 MED ORDER — METOPROLOL TARTRATE 50 MG PO TABS: 50.0000 mg | ORAL_TABLET | Freq: Two times a day (BID) | ORAL | 5 refills | Status: DC

## 2023-02-14 MED ORDER — EZETIMIBE 10 MG PO TABS: 10.0000 mg | ORAL_TABLET | Freq: Every day | ORAL | 5 refills | Status: DC

## 2023-02-14 NOTE — Progress Notes (Addendum)
Patient ambulated in hallway without difficulty. VSS.Patient and wife Nicholas [name] both verbalized undestanding of dc instructions. Ccmd made aware of dc. Incision and Piv sites unremarkable. All belongings and paperwork given to patient.

## 2023-02-14 NOTE — Progress Notes (Signed)
Reviewed/discussed with pt and wife smoking cessation, sternal precautions, walking at home and DM diet. Wife receptive, pt quiet but voices reception. Being referred to [facility]. Gave resources for smoking cessation.  [phone] Ethelda Chick BS, ACSM-CEP 02/14/2023 8:35 AM

## 2023-02-14 NOTE — Progress Notes (Signed)
 Patient states he has a walker at home

## 2023-02-14 NOTE — Plan of Care (Signed)
  Problem: Education: Goal: Knowledge of General Education information will improve Description: Including pain rating scale, medication(s)/side effects and non-pharmacologic comfort measures Outcome: Progressing   Problem: Health Behavior/Discharge Planning: Goal: Ability to manage health-related needs will improve Outcome: Progressing   Problem: Clinical Measurements: Goal: Will remain free from infection Outcome: Progressing   

## 2023-02-14 NOTE — Progress Notes (Addendum)
      [address]       [place] [address]             [phone]      5 Days Post-Op Procedure(s) (LRB): CORONARY ARTERY BYPASS GRAFTING X 5, USING LEFT INTERNAL MAMMARY ARTERY AND ENDOSCOPICALLY HARVESTED LEFT SAPHENOUS VEIN GRAFT (N/A) TRANSESOPHAGEAL ECHOCARDIOGRAM (TEE) (N/A) Subjective:  Up in the bedside chair with his wife at the bedside. Feeling stronger, no new concerns. Would like to return home.   Objective: Vital signs in last 24 hours: Temp:  [98 F (36.7 C)-98.4 F (36.9 C)] 98 F (36.7 C) (01/18 0300) Pulse Rate:  [88-118] 103 (01/18 0300) Cardiac Rhythm: Sinus tachycardia (01/17 2223) Resp:  [18-29] 20 (01/18 0300) BP: (98-134)/(57-78) 112/66 (01/18 0300) SpO2:  [93 %-98 %] 95 % (01/18 0300) FiO2 (%):  [21 %] 21 % (01/17 0825) Weight:  [75.7 kg] 75.7 kg (01/18 0542)     Intake/Output from previous day: 01/17 0701 - 01/18 0700 In: 483 [P.O.:480; I.V.:3] Out: -  Intake/Output this shift: No intake/output data recorded.  General appearance: alert, cooperative, and no distress Neurologic: intact Heart: Sinus tachycardia with improved rates Lungs: breath sounds clear, normal work of breathing Abdomen: soft, no tenderness Extremities: the EVH incisions are intact and dry. No peripheral edema Wound: the sternotomy incision is intact and dry  Lab Results: No results for input(s): "WBC", "HGB", "HCT", "PLT" in the last 72 hours.  BMET:  No results for input(s): "NA", "K", "CL", "CO2", "GLUCOSE", "BUN", "CREATININE", "CALCIUM" in the last 72 hours.   PT/INR: No results for input(s): "LABPROT", "INR" in the last 72 hours. 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 On ASA, metoprolol, Crestor.    -Tachycardia- rate continues to improve BP ~110-130s. Continuing nicotine patch in case tachycardia is from nicotine withdrawal.   -ENDO- history of poorly controlled DM  (HgbA1C 11).  CBG's in the below 200 past 24 hours.  On Semglee 25units daily along with  Novalog 3 units with meals. Resume Humalog at discharge.  -HEME- mild ABL anemia, Hct stable.   -PULM- history of heavy tobacco use. Maintaining sats on RA.  Continue working on [employer].   -Dispo- discharge today. Instructions given and follow up arranged.   LOS: 5 days    Leary Roca, PA-C 02/14/2023  Patient seen and examined, agree with above Dc home today  Salvatore Decent. Dorris Fetch, MD [facility] [phone]

## 2023-02-17 ENCOUNTER — Other Ambulatory Visit: Payer: Self-pay | Admitting: "Endocrinology

## 2023-02-19 ENCOUNTER — Ambulatory Visit: Payer: Self-pay

## 2023-02-19 DIAGNOSIS — Z4802 Encounter for removal of sutures: Secondary | ICD-10-CM

## 2023-02-19 NOTE — Progress Notes (Addendum)
Patient arrived for nurse visit to remove suture/staples post- procedure CABG with Dr. Laneta Simmers 02/09/23.  3 chest tube sutures removed with no signs/ symptoms of infection noted. Incisions well approximated.  Patient tolerated procedure well.  Patient/ family instructed to keep the incision sites clean and dry.  Patient/ family acknowledged instructions given.    Patient did have questions about taking his Metoprolol 50 mg BID. He states that he is having trouble taking it due to his blood pressure being low, but heart rate remains above 100 most times. States that metoprolol has been taken once since discharge. Will ask PA for medication adjustments if necessary.   Spoke with PA, [place] who states patient should be taking the medication as prescribed unless his SBP is lower than 100 or heart rate less than 60. Spoke with patient's wife and acknowledged receipt.

## 2023-02-27 ENCOUNTER — Ambulatory Visit: Payer: BC Managed Care – PPO | Admitting: Cardiology

## 2023-03-02 NOTE — Progress Notes (Signed)
Called patient regarding follow-up LDCT. Patient tells me he has recently had major health concerns and would like to delay follow-up at this time. Patient asks that I call him back at a later date.

## 2023-03-03 ENCOUNTER — Ambulatory Visit: Payer: BC Managed Care – PPO | Admitting: "Endocrinology

## 2023-03-03 ENCOUNTER — Encounter: Payer: Self-pay | Admitting: "Endocrinology

## 2023-03-03 VITALS — BP 104/66 | HR 88 | Ht 69.0 in | Wt 173.4 lb

## 2023-03-03 DIAGNOSIS — E1159 Type 2 diabetes mellitus with other circulatory complications: Secondary | ICD-10-CM | POA: Diagnosis not present

## 2023-03-03 DIAGNOSIS — E782 Mixed hyperlipidemia: Secondary | ICD-10-CM

## 2023-03-03 DIAGNOSIS — Z794 Long term (current) use of insulin: Secondary | ICD-10-CM

## 2023-03-03 DIAGNOSIS — I10 Essential (primary) hypertension: Secondary | ICD-10-CM

## 2023-03-03 MED ORDER — RYBELSUS 3 MG PO TABS: 3.0000 mg | ORAL_TABLET | Freq: Every day | ORAL | 1 refills | Status: DC

## 2023-03-03 NOTE — Progress Notes (Signed)
 03/03/2023  Endocrinology follow-up note  Subjective:    Patient ID: Nicholas [name], male    DOB: [birth year],    Past Medical History:  Diagnosis Date   Anal lesion    Anal papilla/tags - external lesion   Bilateral carpal tunnel syndrome 11/24/2018   Colon polyp    Hyperplastic rectosigmoid polyp 9/08   Coronary atherosclerosis of native coronary artery    DES to PLB, BMS to mid and distal RCA, DES to circumflex and distal LAD, 6/10 (5 stents)   Diabetic peripheral neuropathy (HCC) 11/24/2018   GERD (gastroesophageal reflux disease)    Headache    Migraines   Helicobacter pylori (H. pylori) 09/2009   Treated with helidac   Hiatal hernia    Pt denies   Hyperplastic colon polyp 2008   Mixed hyperlipidemia    Myocardial infarction (HCC) 06/2008   Seizures (HCC)    Related to stroke in 2018. None since then as of 01/2023   Stroke [facility])    January 2018   Type 2 diabetes mellitus [facility])    Past Surgical History:  Procedure Laterality Date   CHOLECYSTECTOMY     COLLAPSED LUNG     15 years ago   COLONOSCOPY  10/12/2006   Dr. Shaaron- rectosigmoid polyp s/phot snare polypectomy o/w normal rectum and terminal ileum. hyperplastic polyp on bx   COLONOSCOPY WITH PROPOFOL  N/A 09/01/2019   Procedure: COLONOSCOPY WITH PROPOFOL ;  Surgeon: Shaaron Lamar HERO, MD;  Location: [facility] ENDO SUITE;  Service: Endoscopy;  Laterality: N/A;  8:30am   CORONARY ARTERY BYPASS GRAFT N/A 02/09/2023   Procedure: CORONARY ARTERY BYPASS GRAFTING X 5, USING LEFT INTERNAL MAMMARY ARTERY AND ENDOSCOPICALLY HARVESTED LEFT SAPHENOUS VEIN GRAFT;  Surgeon: Lucas Dorise POUR, MD;  Location: [facility] OR;  Service: Open Heart Surgery;  Laterality: N/A;   EP IMPLANTABLE DEVICE N/A 02/12/2016   Procedure: Loop Recorder Insertion;  Surgeon: Lynwood Rakers, MD;  Location: [facility] INVASIVE CV LAB;  Service: Cardiovascular;  Laterality: N/A;   ESOPHAGOGASTRODUODENOSCOPY  10/12/2006   Dr. Shaaron- examination of the tubular esophagus revealed no mucosal  abnormalities. the EG junction was easily traversed. small hiatal hernia, the gastric mucosa o/w appeared normal. there was no infiltrating process or frank ulcer seen.   implantable loop recorder removal  09/29/2019   MDT Reveal LINQ removed in office by Dr Rakers   LEFT HEART CATH AND CORONARY ANGIOGRAPHY N/A 01/13/2023   Procedure: LEFT HEART CATH AND CORONARY ANGIOGRAPHY;  Surgeon: Wonda Sharper, MD;  Location: [facility] INVASIVE CV LAB;  Service: Cardiovascular;  Laterality: N/A;   TEE WITHOUT CARDIOVERSION N/A 02/12/2016   Procedure: TRANSESOPHAGEAL ECHOCARDIOGRAM (TEE);  Surgeon: Oneil JAYSON Parchment, MD;  Location: [facility] ENDOSCOPY;  Service: Cardiovascular;  Laterality: N/A;   TEE WITHOUT CARDIOVERSION N/A 02/09/2023   Procedure: TRANSESOPHAGEAL ECHOCARDIOGRAM (TEE);  Surgeon: Lucas Dorise POUR, MD;  Location: [facility] OR;  Service: Open Heart Surgery;  Laterality: N/A;   WIRE IN APEX OF RIGHT LUNG     Since childhood   Social History   Socioeconomic History   Marital status: Married    Spouse name: Not on file   Number of children: Not on file   Years of education: Not on file   Highest education level: Not on file  Occupational History   Occupation: maintenance, parking lots    Employer: SELF-EMPLOYED  Tobacco Use   Smoking status: Former    Current packs/day: 0.50    Average packs/day: 0.5 packs/day for 49.9 years (24.9 ttl pk-yrs)  Types: Cigarettes    Start date: 04/08/1973   Smokeless tobacco: Never  Vaping Use   Vaping status: Never Used  Substance and Sexual Activity   Alcohol use: No    Alcohol/week: 0.0 standard drinks of alcohol   Drug use: No   Sexual activity: Yes    Partners: Female    Birth control/protection: Pill  Other Topics Concern   Not on file  Social History Narrative   Drinks Coffee 2 cups daily.  Lives sat home with wife.   Is self employed.  Graduated from High school.     Social Drivers of Corporate Investment Banker Strain: Not on file  Food Insecurity: No  Food Insecurity (02/09/2023)   Hunger Vital Sign    Worried About Running Out of Food in the Last Year: Never true    Ran Out of Food in the Last Year: Never true  Transportation Needs: No Transportation Needs (02/09/2023)   PRAPARE - Administrator, Civil Service (Medical): No    Lack of Transportation (Non-Medical): No  Physical Activity: Not on file  Stress: Not on file  Social Connections: Not on file   Outpatient Encounter Medications as of 03/03/2023  Medication Sig   Semaglutide  (RYBELSUS ) 3 MG TABS Take 1 tablet (3 mg total) by mouth daily before breakfast.   acetaminophen  (TYLENOL ) 500 MG tablet Take 500 mg by mouth every 6 (six) hours as needed (for headaches).   albuterol  (VENTOLIN  HFA) 108 (90 Base) MCG/ACT inhaler Inhale 1-2 puffs into the lungs every 6 (six) hours as needed for wheezing or shortness of breath.   aspirin  EC 325 MG tablet Take 1 tablet (325 mg total) by mouth daily.   BD PEN NEEDLE NANO U/F 32G X 4 MM MISC USE AS DIRECTED FOUR TIMES DAILY.   Coenzyme Q10 (COQ10) 100 MG CAPS Take 100 mg by mouth daily.   Continuous Glucose Receiver (FREESTYLE LIBRE 3 READER) DEVI 1 Piece by Does not apply route once as needed for up to 1 dose.   Continuous Glucose Sensor (FREESTYLE LIBRE 2 SENSOR) MISC USE 1 EVERY 14 DAYS   ezetimibe  (ZETIA ) 10 MG tablet Take 1 tablet (10 mg total) by mouth daily.   gabapentin  (NEURONTIN ) 100 MG capsule Take 100 mg by mouth daily.    HUMALOG  MIX 75/25 KWIKPEN (75-25) 100 UNIT/ML KwikPen INJECT 60 UNITS SUBCUTANEOUSLY TWICE DAILY WITH MEALS   Metoprolol  Tartrate (LOPRESSOR ) 50 MG tablet Take 1 tablet (50 mg total) by mouth 2 (two) times daily.   nicotine  (NICODERM CQ  - DOSED IN MG/24 HR) 7 mg/24hr patch Place 1 patch (7 mg total) onto the skin daily.   nitroGLYCERIN  (NITROSTAT ) 0.4 MG SL tablet DISSOLVE ONE TABLET UNDER THE TONGUE EVERY 5 MINUTES AS NEEDED FOR CHEST PAIN.  DO NOT EXCEED A TOTAL OF 3 DOSES IN 15 MINUTES   nortriptyline   (PAMELOR ) 10 MG capsule Take 4 capsules (40 mg total) by mouth at bedtime.   pantoprazole  (PROTONIX ) 40 MG tablet Take 40 mg by mouth daily before breakfast.   rosuvastatin  (CRESTOR ) 40 MG tablet Take 40 mg by mouth at bedtime.   Ubrogepant  (UBRELVY ) 100 MG TABS Take 1 tablet (100 mg total) by mouth daily as needed. Take one tablet at onset of headache, may repeat 1 tablet in 2 hours, no more than 2 tablets in 24 hours   No facility-administered encounter medications on file as of 03/03/2023.   ALLERGIES: Allergies  Allergen Reactions   Keppra  [Levetiracetam ]  Hallucination   Topamax  [Topiramate ]     irritable   VACCINATION STATUS:  There is no immunization history on file for this patient.  Diabetes He presents for his follow-up diabetic visit. He has type 2 diabetes mellitus. Onset time: He was diagnosed at approximate age of 49 years. In this particular patient with a history of heavy alcohol use possiblity of pancreatic diabetes is high. His disease course has been worsening. Pertinent negatives for hypoglycemia include no confusion, headaches, pallor, seizures or speech difficulty. Pertinent negatives for diabetes include no chest pain, no fatigue, no polydipsia, no polyphagia, no polyuria and no weakness. There are no hypoglycemic complications. Symptoms are worsening. Diabetic complications include a CVA and heart disease. (In the interval, patient developed shortness of breath and subsequently found to have significant coronary artery disease and underwent coronary artery bypass graft x 5 phases in January 2025.) Risk factors for coronary artery disease include diabetes mellitus, dyslipidemia, hypertension, male sex, sedentary lifestyle and tobacco exposure. Current diabetic treatment includes oral agent (dual therapy). He is compliant with treatment some of the time. His weight is fluctuating minimally. He is following a generally unhealthy diet. When asked about meal planning, he  reported none. He has not had a previous visit with a dietitian (he did not keep appointment.). He rarely participates in exercise. His home blood glucose trend is increasing steadily. His breakfast blood glucose range is generally 180-200 mg/dl. His lunch blood glucose range is generally 180-200 mg/dl. His dinner blood glucose range is generally 180-200 mg/dl. His bedtime blood glucose range is generally 180-200 mg/dl. His overall blood glucose range is 180-200 mg/dl. (After his CABG, he tried to follow the recommended insulin  regimen with subsequent improvement in his glycemic profile.  His CGM profile from prior to his CABG shows average blood glucose of 293 mg/day.  His AGP report shows 70% time range, 12% level 1 hyperglycemia, 71% level 2 hyperglycemia.  His point-of-care A1c previsit was 11% increasing from 9.6% during his last visit.   He did not document hypoglycemia.   ) Eye exam is current.  Hyperlipidemia This is a chronic problem. The current episode started more than 1 year ago. The problem is uncontrolled (He was convinced to take Crestor  during his last visit, returns with significant improvement in his LDL from 135 down to 87, triglycerides from 396-215.). Exacerbating diseases include diabetes. Pertinent negatives include no chest pain, myalgias or shortness of breath. Current antihyperlipidemic treatment includes statins. Risk factors for coronary artery disease include dyslipidemia, diabetes mellitus, hypertension and a sedentary lifestyle.  Hypertension This is a chronic problem. The current episode started more than 1 year ago. The problem is controlled. Pertinent negatives include no chest pain, headaches, neck pain, palpitations or shortness of breath. Risk factors for coronary artery disease include diabetes mellitus, dyslipidemia and smoking/tobacco exposure. Past treatments include beta blockers. Hypertensive end-organ damage includes CAD/MI and CVA.     Objective:    BP  104/66   Pulse 88   Ht 5' 9 (1.753 m)   Wt 173 lb 6.4 oz (78.7 kg)   BMI 25.61 kg/m   Wt Readings from Last 3 Encounters:  03/03/23 173 lb 6.4 oz (78.7 kg)  02/14/23 166 lb 14.2 oz (75.7 kg)  02/05/23 173 lb 14.4 oz (78.9 kg)       Diabetic Labs (most recent): Lab Results  Component Value Date   HGBA1C 11.0 (H) 02/09/2023   HGBA1C 9.6 (A) 10/24/2022   HGBA1C 11.2 (A) 07/11/2022  Lipid Panel     Component Value Date/Time   CHOL 167 01/07/2023 0803   TRIG 260 (H) 01/07/2023 0803   HDL 31 (L) 01/07/2023 0803   CHOLHDL 5.4 (H) 01/07/2023 0803   CHOLHDL 7.7 (H) 08/30/2020 0741   VLDL 20 05/11/2020 0224   LDLCALC 92 01/07/2023 0803   LDLCALC 139 (H) 08/30/2020 0741      Latest Ref Rng & Units 02/11/2023    3:51 AM 02/10/2023    4:01 PM 02/10/2023    3:53 AM  CMP  Glucose 70 - 99 mg/dL 837  784  857   BUN 8 - 23 mg/dL 14  14  9    Creatinine 0.61 - 1.24 mg/dL 8.99  8.87  9.18   Sodium 135 - 145 mmol/L 134  134  136   Potassium 3.5 - 5.1 mmol/L 4.1  4.5  4.5   Chloride 98 - 111 mmol/L 100  100  107   CO2 22 - 32 mmol/L 25  24  24    Calcium  8.9 - 10.3 mg/dL 8.2  8.2  8.1      Assessment & Plan:   1. Type 2 diabetes mellitus with vascular disease (HCC) -His diabetes care is  complicated by history of alarming noncompliance/nonadherence, coronary artery disease and patient remains at a high risk for more acute and chronic complications of diabetes which include CAD, CVA, CKD, retinopathy, and neuropathy. These are all discussed in detail with the patient.  After his CABG, he tried to follow the recommended insulin  regimen with subsequent improvement in his glycemic profile.  His CGM profile from prior to his CABG shows average blood glucose of 293 mg/day.  His AGP report shows 70% time range, 12% level 1 hyperglycemia, 71% level 2 hyperglycemia.  His point-of-care A1c previsit was 11% increasing from 9.6% during his last visit.   He did not document hypoglycemia.   -  Suggestion is made for him to avoid simple carbohydrates  from his diet including Cakes, Sweet Desserts, Ice Cream, Soda (diet and regular), Sweet Tea, Candies, Chips, Cookies, Store Bought Juices, Alcohol in Excess of  1-2 drinks a day, Artificial Sweeteners,  Coffee Creamer, and Sugar-free Products, Lemonade. This will help patient to have more stable blood glucose profile and potentially avoid unintended weight gain.  - Patient is advised to stick to a routine mealtimes to eat 3 meals  a day and avoid unnecessary snacks ( to snack only to correct hypoglycemia).  - I have approached patient with the following individualized plan to manage diabetes and patient agrees.  Unfortunately, Nicholas [name] underwent CABG x 5 in January 2025 in the interval.  Since his surgery, he is attempting to engage better.   He did not comply with basal/bolus insulin  previously tried.  He was switched to premixed insulin  for simplicity reasons.  He was not even committed to use the premixed insulin  twice daily either.    He promises to do better and remain engaged with premixed insulin .  He is advised to continue Humalog  75/25 60 units with breakfast and 60 units with supper  when Premeal blood glucose readings are above 90 mg per DL.    -He is advised to continue monitoring blood glucose continuously using his CGM.  -He is warned not to inject insulin  without proper monitoring of blood glucose. -He may benefit from some additional interventions, discussed and initiated Rybelsus  3 mg p.o. every morning before breakfast.  Side effects and precautions discussed with him.   -Target  numbers for A1c, LDL, HDL, Triglycerides,  were discussed in detail.   2) BP/HTN:  -His blood pressure is controlled to target.  He is advised to continue his current medications including beta blockers.  He has marginal blood pressure, he will not tolerate additional ACE inhibitors.  The patient was counseled on the dangers of tobacco  use, and was advised to quit.  It appears that he has recently quit smoking.  Reviewed strategies to maximize success, including removing cigarettes and smoking materials from environment.  3) Lipids/HPL: His recent labs show uncontrolled LDL of 92.  He is advised to continue Crestor  20 mg p.o. nightly.   Side effects and precautions discussed with him.  4)  Weight/Diet: His BMI is 25.61-  He is not a candidate for weight loss.  He is  following with  CDE consult, exercise, and carbohydrates information provided.    5) Chronic Care/Health Maintenance:  -Patient is  Statin medications and encouraged to continue to follow up with Ophthalmology, Podiatrist at least yearly or according to recommendations, and advised to stay away from Smoking .  He previously quit smoking after a CVA, however resumed subsequently.  I have recommended yearly flu vaccine and pneumonia vaccination at least every 5 years; moderate intensity exercise for up to 150 minutes weekly; and  sleep for at least 7 hours a day.  The patient was counseled on the dangers of tobacco use, and was advised to quit.  Reviewed strategies to maximize success, including removing cigarettes and smoking materials from environment.   He recently had normal ABI, will be repeated in November 2026,  or sooner if needed. I advised patient to maintain close follow up with his PCP for primary care needs.   I spent 42  minutes in the care of the patient today including review of labs from CMP, Lipids, Thyroid  Function, Hematology (current and previous including abstractions from other facilities); face-to-face time discussing  his blood glucose readings/logs, discussing hypoglycemia and hyperglycemia episodes and symptoms, medications doses, his options of short and long term treatment based on the latest standards of care / guidelines;  discussion about incorporating lifestyle medicine;  and documenting the encounter. Risk reduction counseling  performed per USPSTF guidelines to reduce  cardiovascular risk factors.     Please refer to Patient Instructions for Blood Glucose Monitoring and Insulin /Medications Dosing Guide  in media tab for additional information. Please  also refer to  Patient Self Inventory in the Media  tab for reviewed elements of pertinent patient history.  Nicholas [name] participated in the discussions, expressed understanding, and voiced agreement with the above plans.  All questions were answered to his satisfaction. he is encouraged to contact clinic should he have any questions or concerns prior to his return visit.    Follow up plan: Return in about 3 months (around 05/31/2023) for Bring Meter/CGM Device/Logs- A1c in Office.  Ranny Earl, MD Phone: [phone]  Fax: [phone]  This note was partially dictated with voice recognition software. Similar sounding words can be transcribed inadequately or may not  be corrected upon review.  03/03/2023, 9:38 AM

## 2023-03-03 NOTE — Patient Instructions (Signed)

## 2023-03-11 ENCOUNTER — Ambulatory Visit: Payer: Self-pay | Admitting: Cardiology

## 2023-03-17 ENCOUNTER — Other Ambulatory Visit: Payer: Self-pay | Admitting: Surgery

## 2023-03-17 DIAGNOSIS — I2510 Atherosclerotic heart disease of native coronary artery without angina pectoris: Secondary | ICD-10-CM

## 2023-03-18 ENCOUNTER — Ambulatory Visit: Payer: BC Managed Care – PPO | Admitting: Surgery

## 2023-03-18 ENCOUNTER — Ambulatory Visit: Payer: Self-pay

## 2023-03-18 ENCOUNTER — Ambulatory Visit (INDEPENDENT_AMBULATORY_CARE_PROVIDER_SITE_OTHER): Payer: Self-pay | Admitting: Surgical

## 2023-03-18 ENCOUNTER — Ambulatory Visit
Admission: RE | Admit: 2023-03-18 | Discharge: 2023-03-18 | Disposition: A | Discharge status: Home / Self Care | Payer: BC Managed Care – PPO | Source: Ambulatory Visit | Attending: Surgery | Admitting: Surgery

## 2023-03-18 VITALS — BP 113/71 | HR 88 | Resp 18 | Wt 169.0 lb

## 2023-03-18 DIAGNOSIS — Z951 Presence of aortocoronary bypass graft: Secondary | ICD-10-CM

## 2023-03-18 DIAGNOSIS — I2510 Atherosclerotic heart disease of native coronary artery without angina pectoris: Secondary | ICD-10-CM

## 2023-03-18 NOTE — Progress Notes (Signed)
[address]       [place] [address]             [phone]      Nicholas [name] [facility] Medical Record #[id number] Date of Birth: [birth year]  Referring: Tonny Bollman, MD Primary Care: Assunta Found, MD Primary Cardiologist: Nona Dell, MD   Chief Complaint:   POST OP FOLLOW UP CARDIOVASCULAR SURGERY OPERATIVE NOTE   02/09/2023   Surgeon:  Alleen Borne, MD   First Assistant: Lowella Dandy,  PA-C:       Preoperative Diagnosis:  Severe multi-vessel coronary artery disease     Postoperative Diagnosis:  Same     Procedure:   Median Sternotomy Extracorporeal circulation 3.   Coronary artery bypass grafting x 5   Left internal mammary artery graft to the LAD SVG to diagonal 1 SVG to diagonal 2 SVG to OM SVG to PL (RCA) 4.   Endoscopic vein harvest from the right and left leg     Anesthesia:  General Endotracheal   History of Present Illness:   The patient is a 63 year old male seen in the office on today's date and routine postsurgical follow-up status post the above described procedure.  He reports good progress with no specific complaints.  He has had a recent cold with some cough that has made chest a little more sore than previous.  He denies shortness of breath or anginal equivalents/chest pain. He is not having any fevers, chills or other significant constitutional symptoms other than above noted cold.  He is not having any palpitations or lower extremity edema.  Has had no difficulties with his incision.  He has not smoked cigarettes since surgery.  Overall he is pleased with his progress.  He did miss his cardiology appointment because he thought he only needed to be seen at the surgeons office.    Past Medical History:  Diagnosis Date   Anal lesion    Anal papilla/tags - external lesion   Bilateral carpal tunnel syndrome 11/24/2018   Colon polyp    Hyperplastic rectosigmoid polyp 9/08   Coronary atherosclerosis of native  coronary artery    DES to PLB, BMS to mid and distal RCA, DES to circumflex and distal LAD, 6/10 (5 stents)   Diabetic peripheral neuropathy (HCC) 11/24/2018   GERD (gastroesophageal reflux disease)    Headache    Migraines   Helicobacter pylori (H. pylori) 09/2009   Treated with helidac   Hiatal hernia    Pt denies   Hyperplastic colon polyp 2008   Mixed hyperlipidemia    Myocardial infarction (HCC) 06/2008   Seizures (HCC)    Related to stroke in 2018. None since then as of 01/2023   Stroke [facility])    January 2018   Type 2 diabetes mellitus (HCC)      Social History   Tobacco Use  Smoking Status Former   Current packs/day: 0.50   Average packs/day: 0.5 packs/day for 49.9 years (25.0 ttl pk-yrs)   Types: Cigarettes   Start date: 04/08/1973  Smokeless Tobacco Never    Social History   Substance and Sexual Activity  Alcohol Use No   Alcohol/week: 0.0 standard drinks of alcohol     Allergies  Allergen Reactions   Keppra [Levetiracetam]     Hallucination   Topamax [Topiramate]     irritable    Current Outpatient Medications  Medication Sig Dispense Refill   acetaminophen (TYLENOL) 500 MG tablet Take 500  mg by mouth every 6 (six) hours as needed (for headaches).     albuterol (VENTOLIN HFA) 108 (90 Base) MCG/ACT inhaler Inhale 1-2 puffs into the lungs every 6 (six) hours as needed for wheezing or shortness of breath.     aspirin EC 325 MG tablet Take 1 tablet (325 mg total) by mouth daily. 100 tablet 4   BD PEN NEEDLE NANO U/F 32G X 4 MM MISC USE AS DIRECTED FOUR TIMES DAILY. 100 each 2   Coenzyme Q10 (COQ10) 100 MG CAPS Take 100 mg by mouth daily.     Continuous Glucose Receiver (FREESTYLE LIBRE 3 READER) DEVI 1 Piece by Does not apply route once as needed for up to 1 dose. 1 each 0   Continuous Glucose Sensor (FREESTYLE LIBRE 2 SENSOR) MISC USE 1 EVERY 14 DAYS 2 each 0   ezetimibe (ZETIA) 10 MG tablet Take 1 tablet (10 mg total) by mouth daily. 30 tablet 5    gabapentin (NEURONTIN) 100 MG capsule Take 100 mg by mouth daily.      HUMALOG MIX 75/25 KWIKPEN (75-25) 100 UNIT/ML KwikPen INJECT 60 UNITS SUBCUTANEOUSLY TWICE DAILY WITH MEALS 45 mL 0   Metoprolol Tartrate (LOPRESSOR) 50 MG tablet Take 1 tablet (50 mg total) by mouth 2 (two) times daily. 60 tablet 5   nicotine (NICODERM CQ - DOSED IN MG/24 HR) 7 mg/24hr patch Place 1 patch (7 mg total) onto the skin daily. 28 patch 0   nitroGLYCERIN (NITROSTAT) 0.4 MG SL tablet DISSOLVE ONE TABLET UNDER THE TONGUE EVERY 5 MINUTES AS NEEDED FOR CHEST PAIN.  DO NOT EXCEED A TOTAL OF 3 DOSES IN 15 MINUTES 25 tablet 3   nortriptyline (PAMELOR) 10 MG capsule Take 4 capsules (40 mg total) by mouth at bedtime. 120 capsule 5   pantoprazole (PROTONIX) 40 MG tablet Take 40 mg by mouth daily before breakfast.     rosuvastatin (CRESTOR) 40 MG tablet Take 40 mg by mouth at bedtime.     Semaglutide (RYBELSUS) 3 MG TABS Take 1 tablet (3 mg total) by mouth daily before breakfast. 90 tablet 1   Ubrogepant (UBRELVY) 100 MG TABS Take 1 tablet (100 mg total) by mouth daily as needed. Take one tablet at onset of headache, may repeat 1 tablet in 2 hours, no more than 2 tablets in 24 hours 10 tablet 11   No current facility-administered medications for this visit.       Physical Exam: BP 113/71 (BP Location: Left Arm)   Pulse 88   Resp 18   Wt 169 lb (76.7 kg)   SpO2 98%   BMI 24.96 kg/m   General appearance: alert, cooperative, and no distress Heart: regular rate and rhythm, S1, S2 normal, no murmur, click, rub or gallop Pulmonary: Clear lungs Abdominal exam: Benign Extremities: No edema Incisions: Well-healed without evidence of infection   Diagnostic Studies & Laboratory data:     Recent Radiology Findings minor atelectasis     Recent Lab Findings: Lab Results  Component Value Date   WBC 12.2 (H) 02/11/2023   HGB 11.7 (L) 02/11/2023   HCT 35.9 (L) 02/11/2023   PLT 125 (L) 02/11/2023   GLUCOSE 162 (H)  02/11/2023   CHOL 167 01/07/2023   TRIG 260 (H) 01/07/2023   HDL 31 (L) 01/07/2023   LDLCALC 92 01/07/2023   ALT 25 02/09/2023   AST 21 02/09/2023   NA 134 (L) 02/11/2023   K 4.1 02/11/2023   CL 100 02/11/2023  CREATININE 1.00 02/11/2023   BUN 14 02/11/2023   CO2 25 02/11/2023   TSH 2.530 07/09/2022   INR 1.1 02/05/2023   HGBA1C 11.0 (H) 02/09/2023      Assessment / Plan: Excellent progress in his ongoing recovery status post CABG.  He he was instructed on routine progression including driving and lifting instructions.  We will try to help him arrange a follow-up with cardiology.  I did not make any changes to his current medication regimen.  We will see the patient again on a as needed basis for any surgically related needs or at request.      Medication Changes: No orders of the defined types were placed in this encounter.     Nicholas Clack, PA-C  03/18/2023 11:35 AM

## 2023-03-18 NOTE — Patient Instructions (Signed)
Activity progression including lifting restrictions and driving as discussed

## 2023-03-19 ENCOUNTER — Encounter (HOSPITAL_COMMUNITY): Payer: Self-pay

## 2023-03-19 ENCOUNTER — Other Ambulatory Visit: Payer: Self-pay

## 2023-03-19 DIAGNOSIS — E1159 Type 2 diabetes mellitus with other circulatory complications: Secondary | ICD-10-CM

## 2023-03-19 MED ORDER — RYBELSUS 3 MG PO TABS: 3.0000 mg | ORAL_TABLET | Freq: Every day | ORAL | 0 refills | Status: DC

## 2023-03-20 ENCOUNTER — Telehealth: Payer: Self-pay

## 2023-03-20 ENCOUNTER — Other Ambulatory Visit (HOSPITAL_COMMUNITY): Payer: Self-pay

## 2023-03-20 NOTE — Telephone Encounter (Signed)
Pharmacy Patient Advocate Encounter   Received notification from CoverMyMeds that prior authorization for Rybelsus 3mg  is required/requested.   Insurance verification completed.   The patient is insured through [facility] .   Per test claim: PA required; PA submitted to above mentioned insurance via CoverMyMeds Key/confirmation #/[id number] [facility] Status is pending

## 2023-03-25 ENCOUNTER — Telehealth: Payer: Self-pay | Admitting: Cardiology

## 2023-03-25 ENCOUNTER — Encounter (HOSPITAL_COMMUNITY)
Admission: RE | Admit: 2023-03-25 | Discharge: 2023-03-25 | Disposition: A | Discharge status: Home / Self Care | Payer: BC Managed Care – PPO | Source: Ambulatory Visit | Attending: Cardiology | Admitting: Cardiology

## 2023-03-25 VITALS — Ht 68.0 in | Wt 165.7 lb

## 2023-03-25 DIAGNOSIS — Z951 Presence of aortocoronary bypass graft: Secondary | ICD-10-CM | POA: Diagnosis present

## 2023-03-25 MED ORDER — METOPROLOL TARTRATE 50 MG PO TABS: 50.0000 mg | ORAL_TABLET | Freq: Two times a day (BID) | ORAL | 3 refills | Status: DC

## 2023-03-25 NOTE — Progress Notes (Signed)
 Cardiac Individual Treatment Plan  Patient Details  Name: Nicholas [name] MRN: [id number] Date of Birth: November 14, 1960 Referring Provider:   Flowsheet Row CARDIAC REHAB PHASE II ORIENTATION from 03/25/2023 in [facility] CARDIAC REHABILITATION  Referring Provider Evelene Croon MD  [Primary Cardiologist: Dr. Remi Deter McDowell]       Initial Encounter Date:  Flowsheet Row CARDIAC REHAB PHASE II ORIENTATION from 03/25/2023 in [place] CARDIAC REHABILITATION  Date 03/25/23       Visit Diagnosis: S/P CABG x 5  Patient's Home Medications on Admission:  Current Outpatient Medications:    acetaminophen (TYLENOL) 500 MG tablet, Take 500 mg by mouth every 6 (six) hours as needed (for headaches)., Disp: , Rfl:    albuterol (VENTOLIN HFA) 108 (90 Base) MCG/ACT inhaler, Inhale 1-2 puffs into the lungs every 6 (six) hours as needed for wheezing or shortness of breath., Disp: , Rfl:    aspirin EC 325 MG tablet, Take 1 tablet (325 mg total) by mouth daily., Disp: 100 tablet, Rfl: 4   BD PEN NEEDLE NANO U/F 32G X 4 MM MISC, USE AS DIRECTED FOUR TIMES DAILY., Disp: 100 each, Rfl: 2   Coenzyme Q10 (COQ10) 100 MG CAPS, Take 100 mg by mouth daily., Disp: , Rfl:    Continuous Glucose Receiver (FREESTYLE LIBRE 3 READER) DEVI, 1 Piece by Does not apply route once as needed for up to 1 dose., Disp: 1 each, Rfl: 0   Continuous Glucose Sensor (FREESTYLE LIBRE 2 SENSOR) MISC, USE 1 EVERY 14 DAYS, Disp: 2 each, Rfl: 0   ezetimibe (ZETIA) 10 MG tablet, Take 1 tablet (10 mg total) by mouth daily., Disp: 30 tablet, Rfl: 5   gabapentin (NEURONTIN) 100 MG capsule, Take 100 mg by mouth daily. , Disp: , Rfl:    HUMALOG MIX 75/25 KWIKPEN (75-25) 100 UNIT/ML KwikPen, INJECT 60 UNITS SUBCUTANEOUSLY TWICE DAILY WITH MEALS, Disp: 45 mL, Rfl: 0   nicotine (NICODERM CQ - DOSED IN MG/24 HR) 7 mg/24hr patch, Place 1 patch (7 mg total) onto the skin daily., Disp: 28 patch, Rfl: 0   nitroGLYCERIN (NITROSTAT) 0.4 MG SL tablet, DISSOLVE  ONE TABLET UNDER THE TONGUE EVERY 5 MINUTES AS NEEDED FOR CHEST PAIN.  DO NOT EXCEED A TOTAL OF 3 DOSES IN 15 MINUTES, Disp: 25 tablet, Rfl: 3   nortriptyline (PAMELOR) 10 MG capsule, Take 4 capsules (40 mg total) by mouth at bedtime., Disp: 120 capsule, Rfl: 5   pantoprazole (PROTONIX) 40 MG tablet, Take 40 mg by mouth daily before breakfast., Disp: , Rfl:    rosuvastatin (CRESTOR) 40 MG tablet, Take 40 mg by mouth at bedtime., Disp: , Rfl:    Semaglutide (RYBELSUS) 3 MG TABS, Take 1 tablet (3 mg total) by mouth daily before breakfast., Disp: 90 tablet, Rfl: 0   Ubrogepant (UBRELVY) 100 MG TABS, Take 1 tablet (100 mg total) by mouth daily as needed. Take one tablet at onset of headache, may repeat 1 tablet in 2 hours, no more than 2 tablets in 24 hours, Disp: 10 tablet, Rfl: 11   metoprolol tartrate (LOPRESSOR) 50 MG tablet, Take 1 tablet (50 mg total) by mouth 2 (two) times daily., Disp: 180 tablet, Rfl: 3  Past Medical History: Past Medical History:  Diagnosis Date   Anal lesion    Anal papilla/tags - external lesion   Bilateral carpal tunnel syndrome 11/24/2018   Colon polyp    Hyperplastic rectosigmoid polyp 9/08   Coronary atherosclerosis of native coronary artery    DES  to PLB, BMS to mid and distal RCA, DES to circumflex and distal LAD, 6/10 (5 stents)   Diabetic peripheral neuropathy (HCC) 11/24/2018   GERD (gastroesophageal reflux disease)    Headache    Migraines   Helicobacter pylori (H. pylori) 09/2009   Treated with helidac   Hiatal hernia    Pt denies   Hyperplastic colon polyp 2008   Mixed hyperlipidemia    Myocardial infarction (HCC) 06/2008   Seizures (HCC)    Related to stroke in 2018. None since then as of 01/2023   Stroke [facility])    January 2018   Type 2 diabetes mellitus (HCC)     Tobacco Use: Social History   Tobacco Use  Smoking Status Former   Current packs/day: 0.50   Average packs/day: 0.5 packs/day for 50.0 years (25.0 ttl pk-yrs)   Types:  Cigarettes   Start date: 04/08/1973  Smokeless Tobacco Never    Labs: Review Flowsheet  More data exists      Latest Ref Rng & Units 07/09/2022 07/11/2022 10/24/2022 01/07/2023 02/09/2023  Labs for ITP Cardiac and Pulmonary Rehab  Cholestrol 100 - 199 mg/dL 161  - - 096  -  LDL (calc) 0 - 99 mg/dL 77  - - 92  -  HDL-C >04 mg/dL 32  - - 31  -  Trlycerides 0 - 149 mg/dL 540  - - 981  -  Hemoglobin A1c 4.8 - 5.6 % - 11.2  9.6  - 11.0   PH, Arterial 7.35 - 7.45 - - - - 7.329  7.349  7.332  7.362   PCO2 arterial 32 - 48 mmHg - - - - 45.4  45.0  42.6  39.5   Bicarbonate 20.0 - 28.0 mmol/L - - - - 24.0  24.8  22.6  23.9  22.4   TCO2 22 - 32 mmol/L - - - - 25  26  24  22  24  25  25  24  22  24    Acid-base deficit 0.0 - 2.0 mmol/L - - - - 2.0  1.0  3.0  2.0  3.0   O2 Saturation % - - - - 99  94  99  70  100     Details       Multiple values from one day are sorted in reverse-chronological order         Capillary Blood Glucose: Lab Results  Component Value Date   GLUCAP 182 (H) 02/14/2023   GLUCAP 152 (H) 02/13/2023   GLUCAP 149 (H) 02/13/2023   GLUCAP 274 (H) 02/13/2023   GLUCAP 235 (H) 02/13/2023     Exercise Target Goals: Exercise Program Goal: Individual exercise prescription set using results from initial 6 min walk test and THRR while considering  patient's activity barriers and safety.   Exercise Prescription Goal: Starting with aerobic activity 30 plus minutes a day, 3 days per week for initial exercise prescription. Provide home exercise prescription and guidelines that participant acknowledges understanding prior to discharge.  Activity Barriers & Risk Stratification:  Activity Barriers & Cardiac Risk Stratification - 03/25/23 0813       Activity Barriers & Cardiac Risk Stratification   Activity Barriers None    Cardiac Risk Stratification High             6 Minute Walk:  6 Minute Walk     Row Name 03/25/23 0930         6 Minute Walk   Phase Initial  Distance 1075 feet     Walk Time 6 minutes     # of Rest Breaks 0     MPH 2.92     METS 2.04     RPE 10     VO2 Peak 10.22     Symptoms No     Resting HR 88 bpm     Resting BP 90/62     Resting Oxygen Saturation  95 %     Exercise Oxygen Saturation  during 6 min walk 95 %     Max Ex. HR 95 bpm     Max Ex. BP 120/70     2 Minute Post BP 114/60              Oxygen Initial Assessment:   Oxygen Re-Evaluation:   Oxygen Discharge (Final Oxygen Re-Evaluation):   Initial Exercise Prescription:  Initial Exercise Prescription - 03/25/23 0900       Date of Initial Exercise RX and Referring Provider   Date 03/25/23    Referring Provider Evelene Croon MD   Primary Cardiologist: Dr. Nona Dell     Oxygen   Maintain Oxygen Saturation 88% or higher      Treadmill   MPH 2    Grade 0.5    Minutes 15    METs 2.67      REL-XR   Level 3    Speed 50    Minutes 15    METs 2.5      Prescription Details   Frequency (times per week) 3    Duration Progress to 30 minutes of continuous aerobic without signs/symptoms of physical distress      Intensity   THRR 40-80% of Max Heartrate 116-143    Ratings of Perceived Exertion 11-13    Perceived Dyspnea 0-4      Progression   Progression Continue to progress workloads to maintain intensity without signs/symptoms of physical distress.      Resistance Training   Training Prescription Yes    Weight 4 lb    Reps 10-15             Perform Capillary Blood Glucose checks as needed.  Exercise Prescription Changes:   Exercise Prescription Changes     Row Name 03/25/23 0900             Response to Exercise   Blood Pressure (Admit) 90/62       Blood Pressure (Exercise) 120/70       Blood Pressure (Exit) 114/60       Heart Rate (Admit) 88 bpm       Heart Rate (Exercise) 95 bpm       Heart Rate (Exit) 93 bpm       Oxygen Saturation (Admit) 95 %       Oxygen Saturation (Exercise) 95 %       Rating of  Perceived Exertion (Exercise) 10       Symptoms none       Comments walk test results                Exercise Comments:   Exercise Goals and Review:   Exercise Goals     Row Name 03/25/23 0933             Exercise Goals   Increase Physical Activity Yes       Intervention Provide advice, education, support and counseling about physical activity/exercise needs.;Develop an individualized exercise prescription for aerobic and resistive training based on initial  evaluation findings, risk stratification, comorbidities and participant's personal goals.       Expected Outcomes Short Term: Attend rehab on a regular basis to increase amount of physical activity.;Long Term: Add in home exercise to make exercise part of routine and to increase amount of physical activity.;Long Term: Exercising regularly at least 3-5 days a week.       Increase Strength and Stamina Yes       Intervention Develop an individualized exercise prescription for aerobic and resistive training based on initial evaluation findings, risk stratification, comorbidities and participant's personal goals.;Provide advice, education, support and counseling about physical activity/exercise needs.       Expected Outcomes Short Term: Perform resistance training exercises routinely during rehab and add in resistance training at home;Short Term: Increase workloads from initial exercise prescription for resistance, speed, and METs.;Long Term: Improve cardiorespiratory fitness, muscular endurance and strength as measured by increased METs and functional capacity ( )       Able to understand and use rate of perceived exertion (RPE) scale Yes       Intervention Provide education and explanation on how to use RPE scale       Expected Outcomes Short Term: Able to use RPE daily in rehab to express subjective intensity level;Long Term:  Able to use RPE to guide intensity level when exercising independently       Able to understand and use  Dyspnea scale Yes       Intervention Provide education and explanation on how to use Dyspnea scale       Expected Outcomes Short Term: Able to use Dyspnea scale daily in rehab to express subjective sense of shortness of breath during exertion;Long Term: Able to use Dyspnea scale to guide intensity level when exercising independently       Knowledge and understanding of Target Heart Rate Range (THRR) Yes       Intervention Provide education and explanation of THRR including how the numbers were predicted and where they are located for reference       Expected Outcomes Short Term: Able to state/look up THRR;Long Term: Able to use THRR to govern intensity when exercising independently;Short Term: Able to use daily as guideline for intensity in rehab       Able to check pulse independently Yes       Intervention Provide education and demonstration on how to check pulse in carotid and radial arteries.;Review the importance of being able to check your own pulse for safety during independent exercise       Expected Outcomes Short Term: Able to explain why pulse checking is important during independent exercise;Long Term: Able to check pulse independently and accurately       Understanding of Exercise Prescription Yes       Intervention Provide education, explanation, and written materials on patient's individual exercise prescription       Expected Outcomes Short Term: Able to explain program exercise prescription;Long Term: Able to explain home exercise prescription to exercise independently                Exercise Goals Re-Evaluation :    Discharge Exercise Prescription (Final Exercise Prescription Changes):  Exercise Prescription Changes - 03/25/23 0900       Response to Exercise   Blood Pressure (Admit) 90/62    Blood Pressure (Exercise) 120/70    Blood Pressure (Exit) 114/60    Heart Rate (Admit) 88 bpm    Heart Rate (Exercise) 95 bpm    Heart Rate (  Exit) 93 bpm    Oxygen Saturation  (Admit) 95 %    Oxygen Saturation (Exercise) 95 %    Rating of Perceived Exertion (Exercise) 10    Symptoms none    Comments walk test results             Nutrition:  Target Goals: Understanding of nutrition guidelines, daily intake of sodium 1500mg , cholesterol 200mg , calories 30% from fat and 7% or less from saturated fats, daily to have 5 or more servings of fruits and vegetables.  Biometrics:  Pre Biometrics - 03/25/23 0933       Pre Biometrics   Height 5\' 8"  (1.727 m)    Weight 75.2 kg    Waist Circumference 35.5 inches    Hip Circumference 35 inches    Waist to Hip Ratio 1.01 %    BMI (Calculated) 25.2    Grip Strength 30.7 kg    Single Leg Stand 20.3 seconds              Nutrition Therapy Plan and Nutrition Goals:  Nutrition Therapy & Goals - 03/25/23 0934       Intervention Plan   Intervention Prescribe, educate and counsel regarding individualized specific dietary modifications aiming towards targeted core components such as weight, hypertension, lipid management, diabetes, heart failure and other comorbidities.;Nutrition handout(s) given to patient.    Expected Outcomes Short Term Goal: Understand basic principles of dietary content, such as calories, fat, sodium, cholesterol and nutrients.;Long Term Goal: Adherence to prescribed nutrition plan.             Nutrition Assessments:  MEDIFICTS Score Key: >=70 Need to make dietary changes  40-70 Heart Healthy Diet <= 40 Therapeutic Level Cholesterol Diet   Picture Your Plate Scores: <16 Unhealthy dietary pattern with much room for improvement. 41-50 Dietary pattern unlikely to meet recommendations for good health and room for improvement. 51-60 More healthful dietary pattern, with some room for improvement.  >60 Healthy dietary pattern, although there may be some specific behaviors that could be improved.    Nutrition Goals Re-Evaluation:   Nutrition Goals Discharge (Final Nutrition Goals  Re-Evaluation):   Psychosocial: Target Goals: Acknowledge presence or absence of significant depression and/or stress, maximize coping skills, provide positive support system. Participant is able to verbalize types and ability to use techniques and skills needed for reducing stress and depression.  Initial Review & Psychosocial Screening:  Initial Psych Review & Screening - 03/25/23 0901       Initial Review   Current issues with None Identified      Family Dynamics   Good Support System? Yes      Barriers   Psychosocial barriers to participate in program Psychosocial barriers identified (see note)      Screening Interventions   Interventions Encouraged to exercise;Program counselor consult;Provide feedback about the scores to participant;To provide support and resources with identified psychosocial needs    Expected Outcomes Short Term goal: Utilizing psychosocial counselor, staff and physician to assist with identification of specific Stressors or current issues interfering with healing process. Setting desired goal for each stressor or current issue identified.;Long Term Goal: Stressors or current issues are controlled or eliminated.;Short Term goal: Identification and review with participant of any Quality of Life or Depression concerns found by scoring the questionnaire.;Long Term goal: The participant improves quality of Life and PHQ9 Scores as seen by post scores and/or verbalization of changes             Quality of Life  Scores:  Scores of 19 and below usually indicate a poorer quality of life in these areas.  A difference of  2-3 points is a clinically meaningful difference.  A difference of 2-3 points in the total score of the Quality of Life Index has been associated with significant improvement in overall quality of life, self-image, physical symptoms, and general health in studies assessing change in quality of life.  PHQ-9: Review Flowsheet  More data exists       03/25/2023 09/03/2017 04/21/2017 09/30/2016 09/11/2016  Depression screen PHQ 2/9  Decreased Interest 0 0 0 0 0  Down, Depressed, Hopeless 0 0 - 0 0  PHQ - 2 Score 0 0 0 0 0  Altered sleeping 0 - - - -  Tired, decreased energy 0 - - - -  Change in appetite 0 - - - -  Feeling bad or failure about yourself  0 - - - -  Trouble concentrating 0 - - - -  Moving slowly or fidgety/restless 0 - - - -  Suicidal thoughts 0 - - - -  PHQ-9 Score 0 - - - -   Interpretation of Total Score  Total Score Depression Severity:  1-4 = Minimal depression, 5-9 = Mild depression, 10-14 = Moderate depression, 15-19 = Moderately severe depression, 20-27 = Severe depression   Psychosocial Evaluation and Intervention:   Psychosocial Re-Evaluation:  Psychosocial Re-Evaluation     Row Name 03/25/23 1005             Psychosocial Re-Evaluation   Current issues with None Identified       Comments 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 His PHQ9 score was 0. He denies any depression, anxiety or stressors. 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 He hopes to return to work soon. He quit smoking during his hospitalization for surgery and is using nicotine patches some now but not as much as when he started. He says he does not want to smoke now. He lives with his wife who is him main support person along with his daughter and son. He did the program 14 years ago after 2 stents and seems to be very motivated to participate again. His goals for the program are to continue to abstain from smoking, learn what he is able to do; and get his strength and stamina back. He has no barriers identified to complete the program.       Expected Outcomes Short Term: start the program and attend consistently. Long Term: Complete the program and meet his personal goals.       Interventions Stress management education;Relaxation education;Encouraged to attend Cardiac Rehabilitation for the exercise       Continue  Psychosocial Services  No Follow up required                Psychosocial Discharge (Final Psychosocial Re-Evaluation):  Psychosocial Re-Evaluation - 03/25/23 1005       Psychosocial Re-Evaluation   Current issues with None Identified    Comments 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 His PHQ9 score was 0. He denies any depression, anxiety or stressors. 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 He hopes to return to work soon. He quit smoking during his hospitalization for surgery and is using nicotine patches some now but not as much as when he started. He says he does not want to smoke now. He lives with his wife who is  him main support person along with his daughter and son. He did the program 14 years ago after 2 stents and seems to be very motivated to participate again. His goals for the program are to continue to abstain from smoking, learn what he is able to do; and get his strength and stamina back. He has no barriers identified to complete the program.    Expected Outcomes Short Term: start the program and attend consistently. Long Term: Complete the program and meet his personal goals.    Interventions Stress management education;Relaxation education;Encouraged to attend Cardiac Rehabilitation for the exercise    Continue Psychosocial Services  No Follow up required             Vocational Rehabilitation: Provide vocational rehab assistance to qualifying candidates.   Vocational Rehab Evaluation & Intervention:  Vocational Rehab - 03/25/23 0901       Initial Vocational Rehab Evaluation & Intervention   Assessment shows need for Vocational Rehabilitation No      Vocational Rehab Re-Evaulation   Comments Patient is self-employed and is returning to work soon.             Education: Education Goals: Education classes will be provided on a weekly basis, covering required topics. Participant will state understanding/return demonstration of  topics presented.  Learning Barriers/Preferences:  Learning Barriers/Preferences - 03/25/23 0827       Learning Barriers/Preferences   Learning Barriers None    Learning Preferences Skilled Demonstration;Audio;Written Material             Education Topics: Hypertension, Hypertension Reduction -Define heart disease and high blood pressure. Discus how high blood pressure affects the body and ways to reduce high blood pressure.   Exercise and Your Heart -Discuss why it is important to exercise, the FITT principles of exercise, normal and abnormal responses to exercise, and how to exercise safely.   Angina -Discuss definition of angina, causes of angina, treatment of angina, and how to decrease risk of having angina.   Cardiac Medications -Review what the following cardiac medications are used for, how they affect the body, and side effects that may occur when taking the medications.  Medications include Aspirin, Beta blockers, calcium channel blockers, ACE Inhibitors, angiotensin receptor blockers, diuretics, digoxin, and antihyperlipidemics.   Congestive Heart Failure -Discuss the definition of CHF, how to live with CHF, the signs and symptoms of CHF, and how keep track of weight and sodium intake.   Heart Disease and Intimacy -Discus the effect sexual activity has on the heart, how changes occur during intimacy as we age, and safety during sexual activity.   Smoking Cessation / COPD -Discuss different methods to quit smoking, the health benefits of quitting smoking, and the definition of COPD.   Nutrition I: Fats -Discuss the types of cholesterol, what cholesterol does to the heart, and how cholesterol levels can be controlled.   Nutrition II: Labels -Discuss the different components of food labels and how to read food label   Heart Parts/Heart Disease and PAD -Discuss the anatomy of the heart, the pathway of blood circulation through the heart, and these are  affected by heart disease.   Stress I: Signs and Symptoms -Discuss the causes of stress, how stress may lead to anxiety and depression, and ways to limit stress.   Stress II: Relaxation -Discuss different types of relaxation techniques to limit stress.   Warning Signs of Stroke / TIA -Discuss definition of a stroke, what the signs and symptoms are of a  stroke, and how to identify when someone is having stroke.   Knowledge Questionnaire Score:  Knowledge Questionnaire Score - 03/25/23 0947       Knowledge Questionnaire Score   Post Score [id number] (P)              Core Components/Risk Factors/Patient Goals at Admission:  Personal Goals and Risk Factors at Admission - 03/25/23 0901       Core Components/Risk Factors/Patient Goals on Admission    Weight Management Weight Maintenance;Yes    Intervention Weight Management: Develop a combined nutrition and exercise program designed to reach desired caloric intake, while maintaining appropriate intake of nutrient and fiber, sodium and fats, and appropriate energy expenditure required for the weight goal.;Weight Management: Provide education and appropriate resources to help participant work on and attain dietary goals.    Admit Weight 165 lb 11.2 oz (75.2 kg)    Goal Weight: Short Term 165 lb (74.8 kg)    Goal Weight: Long Term 165 lb (74.8 kg)    Expected Outcomes Short Term: Continue to assess and modify interventions until short term weight is achieved;Long Term: Adherence to nutrition and physical activity/exercise program aimed toward attainment of established weight goal;Weight Maintenance: Understanding of the daily nutrition guidelines, which includes 25-35% calories from fat, 7% or less cal from saturated fats, less than 200mg  cholesterol, less than 1.5gm of sodium, & 5 or more servings of fruits and vegetables daily    Diabetes Yes    Intervention Provide education about signs/symptoms and action to take for  hypo/hyperglycemia.;Provide education about proper nutrition, including hydration, and aerobic/resistive exercise prescription along with prescribed medications to achieve blood glucose in normal ranges: Fasting glucose 65-99 mg/dL    Expected Outcomes Short Term: Participant verbalizes understanding of the signs/symptoms and immediate care of hyper/hypoglycemia, proper foot care and importance of medication, aerobic/resistive exercise and nutrition plan for blood glucose control.;Long Term: Attainment of HbA1C < 7%.    Hypertension Yes    Intervention Provide education on lifestyle modifcations including regular physical activity/exercise, weight management, moderate sodium restriction and increased consumption of fresh fruit, vegetables, and low fat dairy, alcohol moderation, and smoking cessation.;Monitor prescription use compliance.    Expected Outcomes Short Term: Continued assessment and intervention until BP is < 140/28mm HG in hypertensive participants. < 130/7mm HG in hypertensive participants with diabetes, heart failure or chronic kidney disease.;Long Term: Maintenance of blood pressure at goal levels.    Lipids Yes    Intervention Provide education and support for participant on nutrition & aerobic/resistive exercise along with prescribed medications to achieve LDL 70mg , HDL >40mg .    Expected Outcomes Short Term: Participant states understanding of desired cholesterol values and is compliant with medications prescribed. Participant is following exercise prescription and nutrition guidelines.;Long Term: Cholesterol controlled with medications as prescribed, with individualized exercise RX and with personalized nutrition plan. Value goals: LDL < 70mg , HDL > 40 mg.             Core Components/Risk Factors/Patient Goals Review:    Core Components/Risk Factors/Patient Goals at Discharge (Final Review):    ITP Comments:   Comments: Patient arrived for 1st visit/orientation/education  at 0800. 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 During orientation advised patient on arrival and appointment times what to wear, what to do before, during and after exercise. Reviewed attendance and class policy.  Pt is scheduled to return Cardiac Rehab on 03/30/23 at 745. Pt was advised to come to class 15 minutes before  class starts.  Discussed RPE/Dpysnea scales. Patient participated in warm up stretches. Patient was able to complete 6 minute walk test.  Telemetry:NSR. Patient was measured for the equipment. Discussed equipment safety with patient. Took patient pre-anthropometric measurements. Patient finished visit at 0915.

## 2023-03-25 NOTE — Telephone Encounter (Signed)
 Pharmacy Patient Advocate Encounter  Received notification from [facility] that Prior Authorization for [employer] has been APPROVED through 03/19/24   PA #/Case ID/Reference #: [id number]

## 2023-03-25 NOTE — Telephone Encounter (Signed)
 Filled as requested

## 2023-03-25 NOTE — Patient Instructions (Signed)
 Patient Instructions  Patient Details  Name: Nicholas [name] MRN: [id number] Date of Birth: 08-28-1960 Referring Provider:  Alleen Borne, MD  Below are your personal goals for exercise, nutrition, and risk factors. Our goal is to help you stay on track towards obtaining and maintaining these goals. We will be discussing your progress on these goals with you throughout the program.  Initial Exercise Prescription:  Initial Exercise Prescription - 03/25/23 0900       Date of Initial Exercise RX and Referring Provider   Date 03/25/23    Referring Provider Evelene Croon MD   Primary Cardiologist: Dr. Nona Dell     Oxygen   Maintain Oxygen Saturation 88% or higher      Treadmill   MPH 2    Grade 0.5    Minutes 15    METs 2.67      REL-XR   Level 3    Speed 50    Minutes 15    METs 2.5      Prescription Details   Frequency (times per week) 3    Duration Progress to 30 minutes of continuous aerobic without signs/symptoms of physical distress      Intensity   THRR 40-80% of Max Heartrate 116-143    Ratings of Perceived Exertion 11-13    Perceived Dyspnea 0-4      Progression   Progression Continue to progress workloads to maintain intensity without signs/symptoms of physical distress.      Resistance Training   Training Prescription Yes    Weight 4 lb    Reps 10-15             Exercise Goals: Frequency: Be able to perform aerobic exercise two to three times per week in program working toward 2-5 days per week of home exercise.  Intensity: Work with a perceived exertion of 11 (fairly light) - 15 (hard) while following your exercise prescription.  We will make changes to your prescription with you as you progress through the program.   Duration: Be able to do 30 to 45 minutes of continuous aerobic exercise in addition to a 5 minute warm-up and a 5 minute cool-down routine.   Nutrition Goals: Your personal nutrition goals will be established when you do your  nutrition analysis with the dietician.  The following are general nutrition guidelines to follow: Cholesterol < 200mg /day Sodium < 1500mg /day Fiber: Men over 50 yrs - 30 grams per day  Personal Goals:  Personal Goals and Risk Factors at Admission - 03/25/23 0901       Core Components/Risk Factors/Patient Goals on Admission    Weight Management Weight Maintenance;Yes    Intervention Weight Management: Develop a combined nutrition and exercise program designed to reach desired caloric intake, while maintaining appropriate intake of nutrient and fiber, sodium and fats, and appropriate energy expenditure required for the weight goal.;Weight Management: Provide education and appropriate resources to help participant work on and attain dietary goals.    Admit Weight 165 lb 11.2 oz (75.2 kg)    Goal Weight: Short Term 165 lb (74.8 kg)    Goal Weight: Long Term 165 lb (74.8 kg)    Expected Outcomes Short Term: Continue to assess and modify interventions until short term weight is achieved;Long Term: Adherence to nutrition and physical activity/exercise program aimed toward attainment of established weight goal;Weight Maintenance: Understanding of the daily nutrition guidelines, which includes 25-35% calories from fat, 7% or less cal from saturated fats, less than 200mg  cholesterol, less than  1.5gm of sodium, & 5 or more servings of fruits and vegetables daily    Diabetes Yes    Intervention Provide education about signs/symptoms and action to take for hypo/hyperglycemia.;Provide education about proper nutrition, including hydration, and aerobic/resistive exercise prescription along with prescribed medications to achieve blood glucose in normal ranges: Fasting glucose 65-99 mg/dL    Expected Outcomes Short Term: Participant verbalizes understanding of the signs/symptoms and immediate care of hyper/hypoglycemia, proper foot care and importance of medication, aerobic/resistive exercise and nutrition plan  for blood glucose control.;Long Term: Attainment of HbA1C < 7%.    Hypertension Yes    Intervention Provide education on lifestyle modifcations including regular physical activity/exercise, weight management, moderate sodium restriction and increased consumption of fresh fruit, vegetables, and low fat dairy, alcohol moderation, and smoking cessation.;Monitor prescription use compliance.    Expected Outcomes Short Term: Continued assessment and intervention until BP is < 140/84mm HG in hypertensive participants. < 130/66mm HG in hypertensive participants with diabetes, heart failure or chronic kidney disease.;Long Term: Maintenance of blood pressure at goal levels.    Lipids Yes    Intervention Provide education and support for participant on nutrition & aerobic/resistive exercise along with prescribed medications to achieve LDL 70mg , HDL >40mg .    Expected Outcomes Short Term: Participant states understanding of desired cholesterol values and is compliant with medications prescribed. Participant is following exercise prescription and nutrition guidelines.;Long Term: Cholesterol controlled with medications as prescribed, with individualized exercise RX and with personalized nutrition plan. Value goals: LDL < 70mg , HDL > 40 mg.             Tobacco Use Initial Evaluation: Social History   Tobacco Use  Smoking Status Former   Current packs/day: 0.50   Average packs/day: 0.5 packs/day for 50.0 years (25.0 ttl pk-yrs)   Types: Cigarettes   Start date: 04/08/1973  Smokeless Tobacco Never    Exercise Goals and Review:  Exercise Goals     Row Name 03/25/23 0933             Exercise Goals   Increase Physical Activity Yes       Intervention Provide advice, education, support and counseling about physical activity/exercise needs.;Develop an individualized exercise prescription for aerobic and resistive training based on initial evaluation findings, risk stratification, comorbidities and  participant's personal goals.       Expected Outcomes Short Term: Attend rehab on a regular basis to increase amount of physical activity.;Long Term: Add in home exercise to make exercise part of routine and to increase amount of physical activity.;Long Term: Exercising regularly at least 3-5 days a week.       Increase Strength and Stamina Yes       Intervention Develop an individualized exercise prescription for aerobic and resistive training based on initial evaluation findings, risk stratification, comorbidities and participant's personal goals.;Provide advice, education, support and counseling about physical activity/exercise needs.       Expected Outcomes Short Term: Perform resistance training exercises routinely during rehab and add in resistance training at home;Short Term: Increase workloads from initial exercise prescription for resistance, speed, and METs.;Long Term: Improve cardiorespiratory fitness, muscular endurance and strength as measured by increased METs and functional capacity ( )       Able to understand and use rate of perceived exertion (RPE) scale Yes       Intervention Provide education and explanation on how to use RPE scale       Expected Outcomes Short Term: Able to use RPE daily in  rehab to express subjective intensity level;Long Term:  Able to use RPE to guide intensity level when exercising independently       Able to understand and use Dyspnea scale Yes       Intervention Provide education and explanation on how to use Dyspnea scale       Expected Outcomes Short Term: Able to use Dyspnea scale daily in rehab to express subjective sense of shortness of breath during exertion;Long Term: Able to use Dyspnea scale to guide intensity level when exercising independently       Knowledge and understanding of Target Heart Rate Range (THRR) Yes       Intervention Provide education and explanation of THRR including how the numbers were predicted and where they are located for  reference       Expected Outcomes Short Term: Able to state/look up THRR;Long Term: Able to use THRR to govern intensity when exercising independently;Short Term: Able to use daily as guideline for intensity in rehab       Able to check pulse independently Yes       Intervention Provide education and demonstration on how to check pulse in carotid and radial arteries.;Review the importance of being able to check your own pulse for safety during independent exercise       Expected Outcomes Short Term: Able to explain why pulse checking is important during independent exercise;Long Term: Able to check pulse independently and accurately       Understanding of Exercise Prescription Yes       Intervention Provide education, explanation, and written materials on patient's individual exercise prescription       Expected Outcomes Short Term: Able to explain program exercise prescription;Long Term: Able to explain home exercise prescription to exercise independently                Copy of goals given to participant.

## 2023-03-25 NOTE — Telephone Encounter (Signed)
 Pt made aware

## 2023-03-25 NOTE — Telephone Encounter (Signed)
 Patient in office today stating he needs a refill on his metoprolol tartrate, [facility] [employer] [facility]. [phone] is the best number to reach the pt.

## 2023-03-30 ENCOUNTER — Encounter (HOSPITAL_COMMUNITY)
Admission: RE | Admit: 2023-03-30 | Discharge: 2023-03-30 | Disposition: A | Discharge status: Home / Self Care | Payer: BC Managed Care – PPO | Source: Ambulatory Visit | Attending: Cardiology | Admitting: Cardiology

## 2023-03-30 DIAGNOSIS — Z951 Presence of aortocoronary bypass graft: Secondary | ICD-10-CM

## 2023-03-30 LAB — GLUCOSE, CAPILLARY: Glucose-Capillary: 360 mg/dL — ABNORMAL HIGH (ref 70–99)

## 2023-03-30 NOTE — Progress Notes (Signed)
 Incomplete Session Note  Patient Details  Name: Nicholas [name] MRN: [id number] Date of Birth: February 26, 1960 Referring Provider:   Flowsheet Row CARDIAC REHAB PHASE II ORIENTATION from 03/25/2023 in [facility] CARDIAC REHABILITATION  Referring Provider Evelene Croon MD  [Primary Cardiologist: Dr. Remi Deter McDowell]       [name] did not complete his rehab session.  Upon checking Nicholas [name]'s blood sugar for his first session, his blood sugar was 360, which according to policy we are unable to let patient's exercise when blood sugar is above 300. Pt stated he forgot to take his evening long acting insulin as he fell asleep.

## 2023-04-01 ENCOUNTER — Encounter (HOSPITAL_COMMUNITY): Payer: BC Managed Care – PPO

## 2023-04-06 ENCOUNTER — Encounter (HOSPITAL_COMMUNITY)
Admission: RE | Admit: 2023-04-06 | Discharge: 2023-04-06 | Disposition: A | Discharge status: Home / Self Care | Payer: BC Managed Care – PPO | Source: Ambulatory Visit | Attending: Cardiology | Admitting: Cardiology

## 2023-04-06 DIAGNOSIS — Z951 Presence of aortocoronary bypass graft: Secondary | ICD-10-CM | POA: Diagnosis present

## 2023-04-06 LAB — GLUCOSE, CAPILLARY
Glucose-Capillary: 188 mg/dL — ABNORMAL HIGH (ref 70–99)
Glucose-Capillary: 208 mg/dL — ABNORMAL HIGH (ref 70–99)

## 2023-04-06 NOTE — Progress Notes (Addendum)
 Daily Session Note  Patient Details  Name: Nicholas [name] MRN: [id number] Date of Birth: August 14, 1960 Referring Provider:   Flowsheet Row CARDIAC REHAB PHASE II ORIENTATION from 03/25/2023 in [facility] CARDIAC REHABILITATION  Referring Provider Evelene Croon MD  [facility] Cardiologist: Dr. Remi Deter McDowell]       Encounter Date: 04/06/2023  Check In:  Session Check In - 04/06/23 0745       Check-In   Supervising physician immediately available to respond to emergencies See telemetry face sheet for immediately available MD    Location [facility]    Staff Present Ross Ludwig, BS, Exercise Physiologist;Jessica Juanetta Gosling, MA, RCEP, CCRP, CCET;Brittany Roseanne Reno, BSN, RN, WTA-C    Virtual Visit No    Medication changes reported     No    Fall or balance concerns reported    No    Tobacco Cessation No Change    Warm-up and Cool-down Performed on first and last piece of equipment    Resistance Training Performed Yes    VAD Patient? No    PAD/SET Patient? No      Pain Assessment   Currently in Pain? No/denies    Multiple Pain Sites No             Capillary Blood Glucose: No results found for this or any previous visit (from the past 24 hours).    Social History   Tobacco Use  Smoking Status Former   Current packs/day: 0.50   Average packs/day: 0.5 packs/day for 50.0 years (25.0 ttl pk-yrs)   Types: Cigarettes   Start date: 04/08/1973  Smokeless Tobacco Never    Goals Met:  Independence with exercise equipment Exercise tolerated well No report of concerns or symptoms today Strength training completed today  Goals Unmet:  Not Applicable  Comments: First full day of exercise!  Patient was oriented to gym and equipment including functions, settings, policies, and procedures.  Patient's individual exercise prescription and treatment plan were reviewed.  All starting workloads were established based on the results of the 6 minute walk test done at initial  orientation visit.  The plan for exercise progression was also introduced and progression will be customized based on patient's performance and goals.

## 2023-04-08 ENCOUNTER — Encounter (HOSPITAL_COMMUNITY)
Admission: RE | Admit: 2023-04-08 | Discharge: 2023-04-08 | Disposition: A | Discharge status: Home / Self Care | Payer: BC Managed Care – PPO | Source: Ambulatory Visit | Attending: Cardiology | Admitting: Cardiology

## 2023-04-08 DIAGNOSIS — Z951 Presence of aortocoronary bypass graft: Secondary | ICD-10-CM

## 2023-04-08 LAB — GLUCOSE, CAPILLARY
Glucose-Capillary: 126 mg/dL — ABNORMAL HIGH (ref 70–99)
Glucose-Capillary: 182 mg/dL — ABNORMAL HIGH (ref 70–99)

## 2023-04-08 NOTE — Progress Notes (Signed)
 Daily Session Note  Patient Details  Name: Nicholas [name] MRN: [id number] Date of Birth: [birth year] Referring Provider:   Flowsheet Row CARDIAC REHAB PHASE II ORIENTATION from 03/25/2023 in [facility] CARDIAC REHABILITATION  Referring Provider Evelene Croon MD  [facility] Cardiologist: Dr. Remi Deter McDowell]       Encounter Date: 04/08/2023  Check In:  Session Check In - 04/08/23 0800       Check-In   Supervising physician immediately available to respond to emergencies See telemetry face sheet for immediately available MD    Location [facility]    Staff Present Ross Ludwig, BS, Exercise Physiologist;Jumana Paccione Juanetta Gosling, MA, RCEP, CCRP, Dow Adolph, RN, BSN    Virtual Visit No    Medication changes reported     No    Fall or balance concerns reported    No    Warm-up and Cool-down Performed on first and last piece of equipment    Resistance Training Performed Yes    VAD Patient? No    PAD/SET Patient? No      Pain Assessment   Currently in Pain? No/denies             Capillary Blood Glucose: No results found for this or any previous visit (from the past 24 hours).    Social History   Tobacco Use  Smoking Status Former   Current packs/day: 0.50   Average packs/day: 0.5 packs/day for 50.0 years (25.0 ttl pk-yrs)   Types: Cigarettes   Start date: 04/08/1973  Smokeless Tobacco Never    Goals Met:  Independence with exercise equipment Exercise tolerated well No report of concerns or symptoms today Strength training completed today  Goals Unmet:  Not Applicable  Comments: Pt able to follow exercise prescription today without complaint.  Will continue to monitor for progression.

## 2023-04-13 ENCOUNTER — Encounter (HOSPITAL_COMMUNITY)
Admission: RE | Admit: 2023-04-13 | Discharge: 2023-04-13 | Disposition: A | Discharge status: Home / Self Care | Payer: BC Managed Care – PPO | Source: Ambulatory Visit | Attending: Cardiology

## 2023-04-13 DIAGNOSIS — Z951 Presence of aortocoronary bypass graft: Secondary | ICD-10-CM | POA: Diagnosis not present

## 2023-04-13 LAB — GLUCOSE, CAPILLARY
Glucose-Capillary: 122 mg/dL — ABNORMAL HIGH (ref 70–99)
Glucose-Capillary: 143 mg/dL — ABNORMAL HIGH (ref 70–99)

## 2023-04-13 NOTE — Progress Notes (Signed)
 Daily Session Note  Patient Details  Name: Nicholas [name] MRN: [id number] Date of Birth: [birth year] Referring Provider:   Flowsheet Row CARDIAC REHAB PHASE II ORIENTATION from 03/25/2023 in [facility] CARDIAC REHABILITATION  Referring Provider Evelene Croon MD  [facility] Cardiologist: Dr. Remi Deter McDowell]       Encounter Date: 04/13/2023  Check In:  Session Check In - 04/13/23 0800       Check-In   Supervising physician immediately available to respond to emergencies See telemetry face sheet for immediately available MD    Location [facility]    Staff Present Ross Ludwig, BS, Exercise Physiologist    Virtual Visit No    Medication changes reported     No    Fall or balance concerns reported    No    Tobacco Cessation No Change    Warm-up and Cool-down Performed on first and last piece of equipment    Resistance Training Performed Yes    VAD Patient? No    PAD/SET Patient? No      Pain Assessment   Currently in Pain? No/denies    Multiple Pain Sites No             Capillary Blood Glucose: Results for orders placed or performed during the hospital encounter of 04/08/23 (from the past 24 hours)  Glucose, capillary     Status: Abnormal   Collection Time: 04/13/23  7:53 AM  Result Value Ref Range   Glucose-Capillary 122 (H) 70 - 99 mg/dL      Social History   Tobacco Use  Smoking Status Former   Current packs/day: 0.50   Average packs/day: 0.5 packs/day for 50.0 years (25.0 ttl pk-yrs)   Types: Cigarettes   Start date: 04/08/1973  Smokeless Tobacco Never    Goals Met:  Independence with exercise equipment Exercise tolerated well No report of concerns or symptoms today Strength training completed today  Goals Unmet:  Not Applicable  Comments: Pt able to follow exercise prescription today without complaint.  Will continue to monitor for progression.

## 2023-04-15 ENCOUNTER — Encounter (HOSPITAL_COMMUNITY)
Admission: RE | Admit: 2023-04-15 | Discharge: 2023-04-15 | Disposition: A | Discharge status: Home / Self Care | Payer: BC Managed Care – PPO | Source: Ambulatory Visit | Attending: Cardiology | Admitting: Cardiology

## 2023-04-15 DIAGNOSIS — Z951 Presence of aortocoronary bypass graft: Secondary | ICD-10-CM

## 2023-04-15 NOTE — Progress Notes (Signed)
 Daily Session Note  Patient Details  Name: Nicholas [name] MRN: [id number] Date of Birth: 1960-04-21 Referring Provider:   Flowsheet Row CARDIAC REHAB PHASE II ORIENTATION from 03/25/2023 in [facility] CARDIAC REHABILITATION  Referring Provider Evelene Croon MD  [facility] Cardiologist: Dr. Remi Deter McDowell]       Encounter Date: 04/15/2023  Check In:  Session Check In - 04/15/23 0759       Check-In   Supervising physician immediately available to respond to emergencies See telemetry face sheet for immediately available MD    Location [facility]    Staff Present Fabio Pierce, MA, RCEP, CCRP, CCET;Hillary Troutman BSN, RN    Virtual Visit No    Medication changes reported     No    Fall or balance concerns reported    No    Warm-up and Cool-down Performed on first and last piece of equipment    Resistance Training Performed Yes    VAD Patient? No    PAD/SET Patient? No      Pain Assessment   Currently in Pain? No/denies             Capillary Blood Glucose: No results found for this or any previous visit (from the past 24 hours).    Social History   Tobacco Use  Smoking Status Former   Current packs/day: 0.50   Average packs/day: 0.5 packs/day for 50.0 years (25.0 ttl pk-yrs)   Types: Cigarettes   Start date: 04/08/1973  Smokeless Tobacco Never    Goals Met:  Independence with exercise equipment Exercise tolerated well No report of concerns or symptoms today Strength training completed today  Goals Unmet:  Not Applicable  Comments: Pt able to follow exercise prescription today without complaint.  Will continue to monitor for progression.

## 2023-04-19 ENCOUNTER — Other Ambulatory Visit: Payer: Self-pay | Admitting: "Endocrinology

## 2023-04-20 ENCOUNTER — Encounter (HOSPITAL_COMMUNITY): Payer: BC Managed Care – PPO

## 2023-04-20 ENCOUNTER — Telehealth (HOSPITAL_COMMUNITY): Payer: Self-pay | Admitting: *Deleted

## 2023-04-20 NOTE — Telephone Encounter (Signed)
 Called to check on patient. Pt was out town for work and forgot to call.  Plans to be here on Wednesday.

## 2023-04-22 ENCOUNTER — Encounter (HOSPITAL_COMMUNITY): Payer: Self-pay | Admitting: *Deleted

## 2023-04-22 ENCOUNTER — Encounter (HOSPITAL_COMMUNITY)
Admission: RE | Admit: 2023-04-22 | Discharge: 2023-04-22 | Disposition: A | Discharge status: Home / Self Care | Payer: BC Managed Care – PPO | Source: Ambulatory Visit | Attending: Cardiology

## 2023-04-22 DIAGNOSIS — Z951 Presence of aortocoronary bypass graft: Secondary | ICD-10-CM

## 2023-04-22 NOTE — Progress Notes (Signed)
 Cardiac Individual Treatment Plan  Patient Details  Name: Nicholas [name] MRN: [id number] Date of Birth: 10-09-1960 Referring Provider:   Flowsheet Row CARDIAC REHAB PHASE II ORIENTATION from 03/25/2023 in [facility] CARDIAC REHABILITATION  Referring Provider Evelene Croon MD  [Primary Cardiologist: Dr. Remi Deter McDowell]       Initial Encounter Date:  Flowsheet Row CARDIAC REHAB PHASE II ORIENTATION from 03/25/2023 in [place] CARDIAC REHABILITATION  Date 03/25/23       Visit Diagnosis: S/P CABG x 5  Patient's Home Medications on Admission:  Current Outpatient Medications:    acetaminophen (TYLENOL) 500 MG tablet, Take 500 mg by mouth every 6 (six) hours as needed (for headaches)., Disp: , Rfl:    albuterol (VENTOLIN HFA) 108 (90 Base) MCG/ACT inhaler, Inhale 1-2 puffs into the lungs every 6 (six) hours as needed for wheezing or shortness of breath., Disp: , Rfl:    aspirin EC 325 MG tablet, Take 1 tablet (325 mg total) by mouth daily., Disp: 100 tablet, Rfl: 4   BD PEN NEEDLE NANO U/F 32G X 4 MM MISC, USE AS DIRECTED FOUR TIMES DAILY., Disp: 100 each, Rfl: 2   Coenzyme Q10 (COQ10) 100 MG CAPS, Take 100 mg by mouth daily., Disp: , Rfl:    Continuous Glucose Receiver (FREESTYLE LIBRE 3 READER) DEVI, 1 Piece by Does not apply route once as needed for up to 1 dose., Disp: 1 each, Rfl: 0   Continuous Glucose Sensor (FREESTYLE LIBRE 2 SENSOR) MISC, USE 1 EVERY 14 DAYS, Disp: 2 each, Rfl: 0   ezetimibe (ZETIA) 10 MG tablet, Take 1 tablet (10 mg total) by mouth daily., Disp: 30 tablet, Rfl: 5   gabapentin (NEURONTIN) 100 MG capsule, Take 100 mg by mouth daily. , Disp: , Rfl:    HUMALOG MIX 75/25 KWIKPEN (75-25) 100 UNIT/ML KwikPen, INJECT 60 UNITS SUBCUTANEOUSLY TWICE DAILY WITH MEALS, Disp: 45 mL, Rfl: 0   metoprolol tartrate (LOPRESSOR) 50 MG tablet, Take 1 tablet (50 mg total) by mouth 2 (two) times daily., Disp: 180 tablet, Rfl: 3   nicotine (NICODERM CQ - DOSED IN MG/24 HR) 7 mg/24hr  patch, Place 1 patch (7 mg total) onto the skin daily., Disp: 28 patch, Rfl: 0   nitroGLYCERIN (NITROSTAT) 0.4 MG SL tablet, DISSOLVE ONE TABLET UNDER THE TONGUE EVERY 5 MINUTES AS NEEDED FOR CHEST PAIN.  DO NOT EXCEED A TOTAL OF 3 DOSES IN 15 MINUTES, Disp: 25 tablet, Rfl: 3   nortriptyline (PAMELOR) 10 MG capsule, Take 4 capsules (40 mg total) by mouth at bedtime., Disp: 120 capsule, Rfl: 5   pantoprazole (PROTONIX) 40 MG tablet, Take 40 mg by mouth daily before breakfast., Disp: , Rfl:    rosuvastatin (CRESTOR) 40 MG tablet, Take 40 mg by mouth at bedtime., Disp: , Rfl:    Semaglutide (RYBELSUS) 3 MG TABS, Take 1 tablet (3 mg total) by mouth daily before breakfast., Disp: 90 tablet, Rfl: 0   Ubrogepant (UBRELVY) 100 MG TABS, Take 1 tablet (100 mg total) by mouth daily as needed. Take one tablet at onset of headache, may repeat 1 tablet in 2 hours, no more than 2 tablets in 24 hours, Disp: 10 tablet, Rfl: 11  Past Medical History: Past Medical History:  Diagnosis Date   Anal lesion    Anal papilla/tags - external lesion   Bilateral carpal tunnel syndrome 11/24/2018   Colon polyp    Hyperplastic rectosigmoid polyp 9/08   Coronary atherosclerosis of native coronary artery    DES  to PLB, BMS to mid and distal RCA, DES to circumflex and distal LAD, 6/10 (5 stents)   Diabetic peripheral neuropathy (HCC) 11/24/2018   GERD (gastroesophageal reflux disease)    Headache    Migraines   Helicobacter pylori (H. pylori) 09/2009   Treated with helidac   Hiatal hernia    Pt denies   Hyperplastic colon polyp 2008   Mixed hyperlipidemia    Myocardial infarction (HCC) 06/2008   Seizures (HCC)    Related to stroke in 2018. None since then as of 01/2023   Stroke [facility])    January 2018   Type 2 diabetes mellitus (HCC)     Tobacco Use: Social History   Tobacco Use  Smoking Status Former   Current packs/day: 0.50   Average packs/day: 0.5 packs/day for 50.0 years (25.0 ttl pk-yrs)   Types:  Cigarettes   Start date: 04/08/1973  Smokeless Tobacco Never    Labs: Review Flowsheet  More data exists      Latest Ref Rng & Units 07/09/2022 07/11/2022 10/24/2022 01/07/2023 02/09/2023  Labs for ITP Cardiac and Pulmonary Rehab  Cholestrol 100 - 199 mg/dL 161  - - 096  -  LDL (calc) 0 - 99 mg/dL 77  - - 92  -  HDL-C >04 mg/dL 32  - - 31  -  Trlycerides 0 - 149 mg/dL 540  - - 981  -  Hemoglobin A1c 4.8 - 5.6 % - 11.2  9.6  - 11.0   PH, Arterial 7.35 - 7.45 - - - - 7.329  7.349  7.332  7.362   PCO2 arterial 32 - 48 mmHg - - - - 45.4  45.0  42.6  39.5   Bicarbonate 20.0 - 28.0 mmol/L - - - - 24.0  24.8  22.6  23.9  22.4   TCO2 22 - 32 mmol/L - - - - 25  26  24  22  24  25  25  24  22  24    Acid-base deficit 0.0 - 2.0 mmol/L - - - - 2.0  1.0  3.0  2.0  3.0   O2 Saturation % - - - - 99  94  99  70  100     Details       Multiple values from one day are sorted in reverse-chronological order         Capillary Blood Glucose: Lab Results  Component Value Date   GLUCAP 143 (H) 04/13/2023   GLUCAP 122 (H) 04/13/2023   GLUCAP 182 (H) 04/08/2023   GLUCAP 126 (H) 04/08/2023   GLUCAP 208 (H) 04/06/2023     Exercise Target Goals: Exercise Program Goal: Individual exercise prescription set using results from initial 6 min walk test and THRR while considering  patient's activity barriers and safety.   Exercise Prescription Goal: Starting with aerobic activity 30 plus minutes a day, 3 days per week for initial exercise prescription. Provide home exercise prescription and guidelines that participant acknowledges understanding prior to discharge.  Activity Barriers & Risk Stratification:  Activity Barriers & Cardiac Risk Stratification - 03/25/23 0813       Activity Barriers & Cardiac Risk Stratification   Activity Barriers None    Cardiac Risk Stratification High             6 Minute Walk:  6 Minute Walk     Row Name 03/25/23 0930         6 Minute Walk   Phase Initial  Distance 1075 feet     Walk Time 6 minutes     # of Rest Breaks 0     MPH 2.92     METS 2.04     RPE 10     VO2 Peak 10.22     Symptoms No     Resting HR 88 bpm     Resting BP 90/62     Resting Oxygen Saturation  95 %     Exercise Oxygen Saturation  during 6 min walk 95 %     Max Ex. HR 95 bpm     Max Ex. BP 120/70     2 Minute Post BP 114/60              Oxygen Initial Assessment:   Oxygen Re-Evaluation:   Oxygen Discharge (Final Oxygen Re-Evaluation):   Initial Exercise Prescription:  Initial Exercise Prescription - 03/25/23 0900       Date of Initial Exercise RX and Referring Provider   Date 03/25/23    Referring Provider Evelene Croon MD   Primary Cardiologist: Dr. Nona Dell     Oxygen   Maintain Oxygen Saturation 88% or higher      Treadmill   MPH 2    Grade 0.5    Minutes 15    METs 2.67      REL-XR   Level 3    Speed 50    Minutes 15    METs 2.5      Prescription Details   Frequency (times per week) 3    Duration Progress to 30 minutes of continuous aerobic without signs/symptoms of physical distress      Intensity   THRR 40-80% of Max Heartrate 116-143    Ratings of Perceived Exertion 11-13    Perceived Dyspnea 0-4      Progression   Progression Continue to progress workloads to maintain intensity without signs/symptoms of physical distress.      Resistance Training   Training Prescription Yes    Weight 4 lb    Reps 10-15             Perform Capillary Blood Glucose checks as needed.  Exercise Prescription Changes:   Exercise Prescription Changes     Row Name 03/25/23 0900 04/13/23 1500           Response to Exercise   Blood Pressure (Admit) 90/62 110/60      Blood Pressure (Exercise) 120/70 106/62      Blood Pressure (Exit) 114/60 102/60      Heart Rate (Admit) 88 bpm 50 bpm      Heart Rate (Exercise) 95 bpm 120 bpm      Heart Rate (Exit) 93 bpm 111 bpm      Oxygen Saturation (Admit) 95 % --      Oxygen  Saturation (Exercise) 95 % --      Rating of Perceived Exertion (Exercise) 10 13      Symptoms none --      Comments walk test results --      Duration -- Continue with 30 min of aerobic exercise without signs/symptoms of physical distress.      Intensity -- THRR unchanged        Progression   Progression -- Continue to progress workloads to maintain intensity without signs/symptoms of physical distress.        Resistance Training   Training Prescription -- Yes      Weight -- 4  Reps -- 10-15        Treadmill   MPH -- 1.7      Grade -- 1      Minutes -- 15      METs -- 2.54        REL-XR   Level -- 3      Speed -- 46      Minutes -- 15      METs -- 2.6               Exercise Comments:   Exercise Comments     Row Name 04/06/23 0754           Exercise Comments First full day of exercise!  Patient was oriented to gym and equipment including functions, settings, policies, and procedures.  Patient's individual exercise prescription and treatment plan were reviewed.  All starting workloads were established based on the results of the 6 minute walk test done at initial orientation visit.  The plan for exercise progression was also introduced and progression will be customized based on patient's performance and goals.                Exercise Goals and Review:   Exercise Goals     Row Name 03/25/23 0933             Exercise Goals   Increase Physical Activity Yes       Intervention Provide advice, education, support and counseling about physical activity/exercise needs.;Develop an individualized exercise prescription for aerobic and resistive training based on initial evaluation findings, risk stratification, comorbidities and participant's personal goals.       Expected Outcomes Short Term: Attend rehab on a regular basis to increase amount of physical activity.;Long Term: Add in home exercise to make exercise part of routine and to increase amount of  physical activity.;Long Term: Exercising regularly at least 3-5 days a week.       Increase Strength and Stamina Yes       Intervention Develop an individualized exercise prescription for aerobic and resistive training based on initial evaluation findings, risk stratification, comorbidities and participant's personal goals.;Provide advice, education, support and counseling about physical activity/exercise needs.       Expected Outcomes Short Term: Perform resistance training exercises routinely during rehab and add in resistance training at home;Short Term: Increase workloads from initial exercise prescription for resistance, speed, and METs.;Long Term: Improve cardiorespiratory fitness, muscular endurance and strength as measured by increased METs and functional capacity ( )       Able to understand and use rate of perceived exertion (RPE) scale Yes       Intervention Provide education and explanation on how to use RPE scale       Expected Outcomes Short Term: Able to use RPE daily in rehab to express subjective intensity level;Long Term:  Able to use RPE to guide intensity level when exercising independently       Able to understand and use Dyspnea scale Yes       Intervention Provide education and explanation on how to use Dyspnea scale       Expected Outcomes Short Term: Able to use Dyspnea scale daily in rehab to express subjective sense of shortness of breath during exertion;Long Term: Able to use Dyspnea scale to guide intensity level when exercising independently       Knowledge and understanding of Target Heart Rate Range (THRR) Yes       Intervention Provide education and explanation of THRR including  how the numbers were predicted and where they are located for reference       Expected Outcomes Short Term: Able to state/look up THRR;Long Term: Able to use THRR to govern intensity when exercising independently;Short Term: Able to use daily as guideline for intensity in rehab       Able to  check pulse independently Yes       Intervention Provide education and demonstration on how to check pulse in carotid and radial arteries.;Review the importance of being able to check your own pulse for safety during independent exercise       Expected Outcomes Short Term: Able to explain why pulse checking is important during independent exercise;Long Term: Able to check pulse independently and accurately       Understanding of Exercise Prescription Yes       Intervention Provide education, explanation, and written materials on patient's individual exercise prescription       Expected Outcomes Short Term: Able to explain program exercise prescription;Long Term: Able to explain home exercise prescription to exercise independently                Exercise Goals Re-Evaluation :  Exercise Goals Re-Evaluation     Row Name 04/06/23 0755 04/13/23 0839           Exercise Goal Re-Evaluation   Exercise Goals Review Knowledge and understanding of Target Heart Rate Range (THRR);Able to understand and use rate of perceived exertion (RPE) scale Increase Strength and Stamina;Increase Physical Activity      Comments Reviewed RPE and dyspnea scale, THR and program prescription with pt today.  Pt voiced understanding and was given a copy of goals to take home Patient states that outside of the program, he walks a little bit at home as he has to walk on job sites a lot. States he still doesn't have much energy, that he eats well and drinks water all the time. Encouraged patient to talk to his Cardiologist about taking a multivitamin and getting some blood work drawn to check for possible low Vit. D and Vit. B12 levels or other levels that could contribute to low energy. Pt states he will bring this up at his heart doctor appt which is April 14th.      Expected Outcomes Short: Use RPE daily to regulate intensity.  Long: Follow program prescription in THR. Short: Continue to attend program and exercise. Long:  Exercise more at home and improve strength and stamina to regain energy levels.                Discharge Exercise Prescription (Final Exercise Prescription Changes):  Exercise Prescription Changes - 04/13/23 1500       Response to Exercise   Blood Pressure (Admit) 110/60    Blood Pressure (Exercise) 106/62    Blood Pressure (Exit) 102/60    Heart Rate (Admit) 50 bpm    Heart Rate (Exercise) 120 bpm    Heart Rate (Exit) 111 bpm    Rating of Perceived Exertion (Exercise) 13    Duration Continue with 30 min of aerobic exercise without signs/symptoms of physical distress.    Intensity THRR unchanged      Progression   Progression Continue to progress workloads to maintain intensity without signs/symptoms of physical distress.      Resistance Training   Training Prescription Yes    Weight 4    Reps 10-15      Treadmill   MPH 1.7    Grade 1  Minutes 15    METs 2.54      REL-XR   Level 3    Speed 46    Minutes 15    METs 2.6             Nutrition:  Target Goals: Understanding of nutrition guidelines, daily intake of sodium 1500mg , cholesterol 200mg , calories 30% from fat and 7% or less from saturated fats, daily to have 5 or more servings of fruits and vegetables.  Biometrics:  Pre Biometrics - 03/25/23 0933       Pre Biometrics   Height 5\' 8"  (1.727 m)    Weight 165 lb 11.2 oz (75.2 kg)    Waist Circumference 35.5 inches    Hip Circumference 35 inches    Waist to Hip Ratio 1.01 %    BMI (Calculated) 25.2    Grip Strength 30.7 kg    Single Leg Stand 20.3 seconds              Nutrition Therapy Plan and Nutrition Goals:  Nutrition Therapy & Goals - 03/25/23 0934       Intervention Plan   Intervention Prescribe, educate and counsel regarding individualized specific dietary modifications aiming towards targeted core components such as weight, hypertension, lipid management, diabetes, heart failure and other comorbidities.;Nutrition handout(s)  given to patient.    Expected Outcomes Short Term Goal: Understand basic principles of dietary content, such as calories, fat, sodium, cholesterol and nutrients.;Long Term Goal: Adherence to prescribed nutrition plan.             Nutrition Assessments:  MEDIFICTS Score Key: >=70 Need to make dietary changes  40-70 Heart Healthy Diet <= 40 Therapeutic Level Cholesterol Diet   Picture Your Plate Scores: <25 Unhealthy dietary pattern with much room for improvement. 41-50 Dietary pattern unlikely to meet recommendations for good health and room for improvement. 51-60 More healthful dietary pattern, with some room for improvement.  >60 Healthy dietary pattern, although there may be some specific behaviors that could be improved.    Nutrition Goals Re-Evaluation:  Nutrition Goals Re-Evaluation     Row Name 04/13/23 [phone]             Goals   Nutrition Goal Eat healthy consistently.       Comment Patient states he eats healthy most of the time. States he will have a cheat day every now and then, but not often.       Expected Outcome Short term: Continue to eat healthy. Long term: Still eat healthy and have cheat days in moderation.                Nutrition Goals Discharge (Final Nutrition Goals Re-Evaluation):  Nutrition Goals Re-Evaluation - 04/13/23 0833       Goals   Nutrition Goal Eat healthy consistently.    Comment Patient states he eats healthy most of the time. States he will have a cheat day every now and then, but not often.    Expected Outcome Short term: Continue to eat healthy. Long term: Still eat healthy and have cheat days in moderation.             Psychosocial: Target Goals: Acknowledge presence or absence of significant depression and/or stress, maximize coping skills, provide positive support system. Participant is able to verbalize types and ability to use techniques and skills needed for reducing stress and depression.  Initial Review &  Psychosocial Screening:  Initial Psych Review & Screening - 03/25/23 0901  Initial Review   Current issues with None Identified      Family Dynamics   Good Support System? Yes      Barriers   Psychosocial barriers to participate in program Psychosocial barriers identified (see note)      Screening Interventions   Interventions Encouraged to exercise;Program counselor consult;Provide feedback about the scores to participant;To provide support and resources with identified psychosocial needs    Expected Outcomes Short Term goal: Utilizing psychosocial counselor, staff and physician to assist with identification of specific Stressors or current issues interfering with healing process. Setting desired goal for each stressor or current issue identified.;Long Term Goal: Stressors or current issues are controlled or eliminated.;Short Term goal: Identification and review with participant of any Quality of Life or Depression concerns found by scoring the questionnaire.;Long Term goal: The participant improves quality of Life and PHQ9 Scores as seen by post scores and/or verbalization of changes             Quality of Life Scores:  Quality of Life - 03/25/23 1338       Quality of Life   Select Quality of Life      Quality of Life Scores   Health/Function Pre 17.1 %    Socioeconomic Pre 17.5 %    Psych/Spiritual Pre 17.5 %    Family Pre 17.5 %    GLOBAL Pre 17.33 %            Scores of 19 and below usually indicate a poorer quality of life in these areas.  A difference of  2-3 points is a clinically meaningful difference.  A difference of 2-3 points in the total score of the Quality of Life Index has been associated with significant improvement in overall quality of life, self-image, physical symptoms, and general health in studies assessing change in quality of life.  PHQ-9: Review Flowsheet  More data exists      03/25/2023 09/03/2017 04/21/2017 09/30/2016 09/11/2016  Depression  screen PHQ 2/9  Decreased Interest 0 0 0 0 0  Down, Depressed, Hopeless 0 0 - 0 0  PHQ - 2 Score 0 0 0 0 0  Altered sleeping 0 - - - -  Tired, decreased energy 0 - - - -  Change in appetite 0 - - - -  Feeling bad or failure about yourself  0 - - - -  Trouble concentrating 0 - - - -  Moving slowly or fidgety/restless 0 - - - -  Suicidal thoughts 0 - - - -  PHQ-9 Score 0 - - - -   Interpretation of Total Score  Total Score Depression Severity:  1-4 = Minimal depression, 5-9 = Mild depression, 10-14 = Moderate depression, 15-19 = Moderately severe depression, 20-27 = Severe depression   Psychosocial Evaluation and Intervention:   Psychosocial Re-Evaluation:  Psychosocial Re-Evaluation     Row Name 03/25/23 1005 04/13/23 0865           Psychosocial Re-Evaluation   Current issues with None Identified Current Sleep Concerns      Comments 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 His PHQ9 score was 0. He denies any depression, anxiety or stressors. 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 He hopes to return to work soon. He quit smoking during his hospitalization for surgery and is using nicotine patches some now but not as much as when he started. He says he does not want to smoke now. He lives with  his wife who is him main support person along with his daughter and son. He did the program 14 years ago after 2 stents and seems to be very motivated to participate again. His goals for the program are to continue to abstain from smoking, learn what he is able to do; and get his strength and stamina back. He has no barriers identified to complete the program. Pt states overall he is sleeping well. He has some nights that he doesn't sleep good because he just isn't tired, and other nights he sleeps well. He says this has been going on for a while now though. He states he does not have any current stress concerns.      Expected Outcomes Short Term: start the program and  attend consistently. Long Term: Complete the program and meet his personal goals. Short term: continue the program consistently. Long term: Complete the program and meet personal goals.      Interventions Stress management education;Relaxation education;Encouraged to attend Cardiac Rehabilitation for the exercise Encouraged to attend Cardiac Rehabilitation for the exercise      Continue Psychosocial Services  No Follow up required Follow up required by staff               Psychosocial Discharge (Final Psychosocial Re-Evaluation):  Psychosocial Re-Evaluation - 04/13/23 1914       Psychosocial Re-Evaluation   Current issues with Current Sleep Concerns    Comments Pt states overall he is sleeping well. He has some nights that he doesn't sleep good because he just isn't tired, and other nights he sleeps well. He says this has been going on for a while now though. He states he does not have any current stress concerns.    Expected Outcomes Short term: continue the program consistently. Long term: Complete the program and meet personal goals.    Interventions Encouraged to attend Cardiac Rehabilitation for the exercise    Continue Psychosocial Services  Follow up required by staff             Vocational Rehabilitation: Provide vocational rehab assistance to qualifying candidates.   Vocational Rehab Evaluation & Intervention:  Vocational Rehab - 03/25/23 0901       Initial Vocational Rehab Evaluation & Intervention   Assessment shows need for Vocational Rehabilitation No      Vocational Rehab Re-Evaulation   Comments Patient is self-employed and is returning to work soon.             Education: Education Goals: Education classes will be provided on a weekly basis, covering required topics. Participant will state understanding/return demonstration of topics presented.  Learning Barriers/Preferences:  Learning Barriers/Preferences - 03/25/23 0827       Learning  Barriers/Preferences   Learning Barriers None    Learning Preferences Skilled Demonstration;Audio;Written Material             Education Topics: Hypertension, Hypertension Reduction -Define heart disease and high blood pressure. Discus how high blood pressure affects the body and ways to reduce high blood pressure. Flowsheet Row CARDIAC REHAB PHASE II EXERCISE from 04/22/2023 in [facility] [place] CARDIAC REHABILITATION  Date 04/15/23  Educator [facility]  Instruction Review Code 1- Verbalizes Understanding       Exercise and Your Heart -Discuss why it is important to exercise, the FITT principles of exercise, normal and abnormal responses to exercise, and how to exercise safely.   Angina -Discuss definition of angina, causes of angina, treatment of angina, and how to decrease risk of having angina.  Cardiac Medications -Review what the following cardiac medications are used for, how they affect the body, and side effects that may occur when taking the medications.  Medications include Aspirin, Beta blockers, calcium channel blockers, ACE Inhibitors, angiotensin receptor blockers, diuretics, digoxin, and antihyperlipidemics. Flowsheet Row CARDIAC REHAB PHASE II EXERCISE from 04/22/2023 in [place] CARDIAC REHABILITATION  Date 04/22/23  Educator HB  Instruction Review Code 1- Verbalizes Understanding       Congestive Heart Failure -Discuss the definition of CHF, how to live with CHF, the signs and symptoms of CHF, and how keep track of weight and sodium intake. Flowsheet Row CARDIAC REHAB PHASE II EXERCISE from 04/22/2023 in [place] CARDIAC REHABILITATION  Date 04/15/23  Educator [facility]  Instruction Review Code 1- Verbalizes Understanding       Heart Disease and Intimacy -Discus the effect sexual activity has on the heart, how changes occur during intimacy as we age, and safety during sexual activity.   Smoking Cessation / COPD -Discuss different methods to quit smoking, the  health benefits of quitting smoking, and the definition of COPD.   Nutrition I: Fats -Discuss the types of cholesterol, what cholesterol does to the heart, and how cholesterol levels can be controlled.   Nutrition II: Labels -Discuss the different components of food labels and how to read food label   Heart Parts/Heart Disease and PAD -Discuss the anatomy of the heart, the pathway of blood circulation through the heart, and these are affected by heart disease.   Stress I: Signs and Symptoms -Discuss the causes of stress, how stress may lead to anxiety and depression, and ways to limit stress. Flowsheet Row CARDIAC REHAB PHASE II EXERCISE from 04/22/2023 in [place] CARDIAC REHABILITATION  Date 04/08/23  Educator [facility]  Instruction Review Code 1- Verbalizes Understanding       Stress II: Relaxation -Discuss different types of relaxation techniques to limit stress.   Warning Signs of Stroke / TIA -Discuss definition of a stroke, what the signs and symptoms are of a stroke, and how to identify when someone is having stroke.   Knowledge Questionnaire Score:  Knowledge Questionnaire Score - 03/25/23 0947       Knowledge Questionnaire Score   Post Score [id number] (P)              Core Components/Risk Factors/Patient Goals at Admission:  Personal Goals and Risk Factors at Admission - 03/25/23 0901       Core Components/Risk Factors/Patient Goals on Admission    Weight Management Weight Maintenance;Yes    Intervention Weight Management: Develop a combined nutrition and exercise program designed to reach desired caloric intake, while maintaining appropriate intake of nutrient and fiber, sodium and fats, and appropriate energy expenditure required for the weight goal.;Weight Management: Provide education and appropriate resources to help participant work on and attain dietary goals.    Admit Weight 165 lb 11.2 oz (75.2 kg)    Goal Weight: Short Term 165 lb (74.8 kg)    Goal  Weight: Long Term 165 lb (74.8 kg)    Expected Outcomes Short Term: Continue to assess and modify interventions until short term weight is achieved;Long Term: Adherence to nutrition and physical activity/exercise program aimed toward attainment of established weight goal;Weight Maintenance: Understanding of the daily nutrition guidelines, which includes 25-35% calories from fat, 7% or less cal from saturated fats, less than 200mg  cholesterol, less than 1.5gm of sodium, & 5 or more servings of fruits and vegetables daily    Diabetes Yes  Intervention Provide education about signs/symptoms and action to take for hypo/hyperglycemia.;Provide education about proper nutrition, including hydration, and aerobic/resistive exercise prescription along with prescribed medications to achieve blood glucose in normal ranges: Fasting glucose 65-99 mg/dL    Expected Outcomes Short Term: Participant verbalizes understanding of the signs/symptoms and immediate care of hyper/hypoglycemia, proper foot care and importance of medication, aerobic/resistive exercise and nutrition plan for blood glucose control.;Long Term: Attainment of HbA1C < 7%.    Hypertension Yes    Intervention Provide education on lifestyle modifcations including regular physical activity/exercise, weight management, moderate sodium restriction and increased consumption of fresh fruit, vegetables, and low fat dairy, alcohol moderation, and smoking cessation.;Monitor prescription use compliance.    Expected Outcomes Short Term: Continued assessment and intervention until BP is < 140/56mm HG in hypertensive participants. < 130/72mm HG in hypertensive participants with diabetes, heart failure or chronic kidney disease.;Long Term: Maintenance of blood pressure at goal levels.    Lipids Yes    Intervention Provide education and support for participant on nutrition & aerobic/resistive exercise along with prescribed medications to achieve LDL 70mg , HDL >40mg .     Expected Outcomes Short Term: Participant states understanding of desired cholesterol values and is compliant with medications prescribed. Participant is following exercise prescription and nutrition guidelines.;Long Term: Cholesterol controlled with medications as prescribed, with individualized exercise RX and with personalized nutrition plan. Value goals: LDL < 70mg , HDL > 40 mg.             Core Components/Risk Factors/Patient Goals Review:   Goals and Risk Factor Review     Row Name 04/13/23 0835             Core Components/Risk Factors/Patient Goals Review   Personal Goals Review Hypertension;Diabetes;Lipids       Review Patient states that he takes his medications like he is supposed to. The first time he came to visit [place], he was unable to participate in exercise because of his blood sugar being to high. The reason was he forgot to take his long acting insulin the night before. Joe states he is more compliant with his medications and his blood sugars have been a lot better since he has came to see [place] since.       Expected Outcomes Short: Continue taking medications as prescribed. Long: Continue medications and get blood sugar better under control with diet and exercise.                Core Components/Risk Factors/Patient Goals at Discharge (Final Review):   Goals and Risk Factor Review - 04/13/23 0835       Core Components/Risk Factors/Patient Goals Review   Personal Goals Review Hypertension;Diabetes;Lipids    Review Patient states that he takes his medications like he is supposed to. The first time he came to visit [place], he was unable to participate in exercise because of his blood sugar being to high. The reason was he forgot to take his long acting insulin the night before. Joe states he is more compliant with his medications and his blood sugars have been a lot better since he has came to see [place] since.    Expected Outcomes Short: Continue taking medications as prescribed.  Long: Continue medications and get blood sugar better under control with diet and exercise.             ITP Comments:  ITP Comments     Row Name 04/06/23 0754 04/22/23 0803         ITP  Comments First full day of exercise!  Patient was oriented to gym and equipment including functions, settings, policies, and procedures.  Patient's individual exercise prescription and treatment plan were reviewed.  All starting workloads were established based on the results of the 6 minute walk test done at initial orientation visit.  The plan for exercise progression was also introduced and progression will be customized based on patient's performance and goals. 30 day review completed. ITP sent to Dr. Dina Rich, Medical Director of Cardiac Rehab. Continue with ITP unless changes are made by physician.               Comments: 30 day review

## 2023-04-22 NOTE — Progress Notes (Deleted)
 Daily Session Note  Patient Details  Name: Nicholas [name] MRN: [id number] Date of Birth: April 03, 1960 Referring Provider:   Flowsheet Row CARDIAC REHAB PHASE II ORIENTATION from 03/25/2023 in [facility] CARDIAC REHABILITATION  Referring Provider Evelene Croon MD  [facility] Cardiologist: Dr. Remi Deter McDowell]       Encounter Date: 04/22/2023  Check In:  Session Check In - 04/22/23 0745       Check-In   Supervising physician immediately available to respond to emergencies See telemetry face sheet for immediately available MD    Location [facility]    Staff Present Avanell Shackleton BSN, RN;Heather Fredric Mare, [place], Exercise Physiologist    Virtual Visit No    Medication changes reported     No    Fall or balance concerns reported    No    Tobacco Cessation No Change    Warm-up and Cool-down Performed on first and last piece of equipment    Resistance Training Performed Yes    VAD Patient? No    PAD/SET Patient? No      Pain Assessment   Currently in Pain? No/denies    Multiple Pain Sites No             Capillary Blood Glucose: No results found for this or any previous visit (from the past 24 hours).    Social History   Tobacco Use  Smoking Status Former   Current packs/day: 0.50   Average packs/day: 0.5 packs/day for 50.0 years (25.0 ttl pk-yrs)   Types: Cigarettes   Start date: 04/08/1973  Smokeless Tobacco Never    Goals Met:  Independence with exercise equipment Exercise tolerated well No report of concerns or symptoms today Strength training completed today  Goals Unmet:  Not Applicable  Comments: Marland KitchenMarland KitchenPt able to follow exercise prescription today without complaint.  Will continue to monitor for progression.

## 2023-04-23 ENCOUNTER — Other Ambulatory Visit: Payer: Self-pay

## 2023-04-23 DIAGNOSIS — E1159 Type 2 diabetes mellitus with other circulatory complications: Secondary | ICD-10-CM

## 2023-04-23 MED ORDER — INSULIN LISPRO PROT & LISPRO (75-25 MIX) 100 UNIT/ML KWIKPEN: 60.0000 [IU] | PEN_INJECTOR | Freq: Two times a day (BID) | SUBCUTANEOUS | 0 refills | Status: DC

## 2023-04-27 ENCOUNTER — Encounter (HOSPITAL_COMMUNITY)
Admission: RE | Admit: 2023-04-27 | Discharge: 2023-04-27 | Disposition: A | Discharge status: Home / Self Care | Payer: BC Managed Care – PPO | Source: Ambulatory Visit | Attending: Cardiology

## 2023-04-27 DIAGNOSIS — Z951 Presence of aortocoronary bypass graft: Secondary | ICD-10-CM

## 2023-04-27 NOTE — Progress Notes (Signed)
 Daily Session Note  Patient Details  Name: Nicholas [name] MRN: [id number] Date of Birth: 1960-04-02 Referring Provider:   Flowsheet Row CARDIAC REHAB PHASE II ORIENTATION from 03/25/2023 in [facility] CARDIAC REHABILITATION  Referring Provider Evelene Croon MD  [facility] Cardiologist: Dr. Remi Deter McDowell]       Encounter Date: 04/27/2023  Check In:  Session Check In - 04/27/23 0745       Check-In   Supervising physician immediately available to respond to emergencies See telemetry face sheet for immediately available MD    Location [facility]    Staff Present Ross Ludwig, BS, Exercise Physiologist;Jessica Juanetta Gosling, MA, RCEP, CCRP, CCET    Virtual Visit No    Medication changes reported     No    Fall or balance concerns reported    No    Tobacco Cessation No Change    Warm-up and Cool-down Performed on first and last piece of equipment    Resistance Training Performed Yes    VAD Patient? No    PAD/SET Patient? No      Pain Assessment   Currently in Pain? No/denies    Multiple Pain Sites No             Capillary Blood Glucose: No results found for this or any previous visit (from the past 24 hours).    Social History   Tobacco Use  Smoking Status Former   Current packs/day: 0.50   Average packs/day: 0.5 packs/day for 50.0 years (25.0 ttl pk-yrs)   Types: Cigarettes   Start date: 04/08/1973  Smokeless Tobacco Never    Goals Met:  Independence with exercise equipment Exercise tolerated well No report of concerns or symptoms today Strength training completed today  Goals Unmet:  Not Applicable  Comments: Pt able to follow exercise prescription today without complaint.  Will continue to monitor for progression.

## 2023-04-29 ENCOUNTER — Encounter (HOSPITAL_COMMUNITY)
Admission: RE | Admit: 2023-04-29 | Discharge: 2023-04-29 | Disposition: A | Discharge status: Home / Self Care | Payer: BC Managed Care – PPO | Source: Ambulatory Visit | Attending: Cardiology | Admitting: Cardiology

## 2023-04-29 DIAGNOSIS — Z951 Presence of aortocoronary bypass graft: Secondary | ICD-10-CM | POA: Diagnosis present

## 2023-04-29 NOTE — Progress Notes (Signed)
 Daily Session Note  Patient Details  Name: Nicholas [name] MRN: [id number] Date of Birth: [birth year] Referring Provider:   Flowsheet Row CARDIAC REHAB PHASE II ORIENTATION from 03/25/2023 in [facility] CARDIAC REHABILITATION  Referring Provider Evelene Croon MD  [facility] Cardiologist: Dr. Remi Deter McDowell]       Encounter Date: 04/29/2023  Check In:  Session Check In - 04/29/23 0745       Check-In   Supervising physician immediately available to respond to emergencies See telemetry face sheet for immediately available MD    Location [facility]    Staff Present Ross Ludwig, BS, Exercise Physiologist;Jessica Juanetta Gosling, MA, RCEP, CCRP, CCET    Virtual Visit No    Medication changes reported     No    Fall or balance concerns reported    No    Tobacco Cessation No Change    Warm-up and Cool-down Performed on first and last piece of equipment    Resistance Training Performed Yes    VAD Patient? No    PAD/SET Patient? No      Pain Assessment   Currently in Pain? No/denies    Multiple Pain Sites No             Capillary Blood Glucose: No results found for this or any previous visit (from the past 24 hours).    Social History   Tobacco Use  Smoking Status Former   Current packs/day: 0.50   Average packs/day: 0.5 packs/day for 50.1 years (25.0 ttl pk-yrs)   Types: Cigarettes   Start date: 04/08/1973  Smokeless Tobacco Never    Goals Met:  Independence with exercise equipment Exercise tolerated well No report of concerns or symptoms today Strength training completed today  Goals Unmet:  Not Applicable  Comments: Pt able to follow exercise prescription today without complaint.  Will continue to monitor for progression.

## 2023-05-01 NOTE — Progress Notes (Signed)
 Attempted to reach patient regarding follow-up LDCT. Unable to reach patient directly. Detailed VM left asking that the patient return my call.

## 2023-05-04 ENCOUNTER — Encounter (HOSPITAL_COMMUNITY)
Admission: RE | Admit: 2023-05-04 | Discharge: 2023-05-04 | Disposition: A | Discharge status: Home / Self Care | Payer: BC Managed Care – PPO | Source: Ambulatory Visit | Attending: Cardiology

## 2023-05-04 DIAGNOSIS — Z951 Presence of aortocoronary bypass graft: Secondary | ICD-10-CM

## 2023-05-04 NOTE — Progress Notes (Signed)
 Daily Session Note  Patient Details  Name: Nicholas [name] MRN: [id number] Date of Birth: 1960/03/18 Referring Provider:   Flowsheet Row CARDIAC REHAB PHASE II ORIENTATION from 03/25/2023 in [facility] CARDIAC REHABILITATION  Referring Provider Evelene Croon MD  [facility] Cardiologist: Dr. Remi Deter McDowell]       Encounter Date: 05/04/2023  Check In:  Session Check In - 05/04/23 0752       Check-In   Supervising physician immediately available to respond to emergencies See telemetry face sheet for immediately available MD    Location [facility]    Staff Present Desiree Lucy, BSN, RN, Sherlyn Hay, MA, RCEP, CCRP, CCET    Virtual Visit No    Medication changes reported     No    Fall or balance concerns reported    No    Tobacco Cessation No Change    Warm-up and Cool-down Performed on first and last piece of equipment    Resistance Training Performed Yes    VAD Patient? No    PAD/SET Patient? No      Pain Assessment   Currently in Pain? No/denies             Capillary Blood Glucose: No results found for this or any previous visit (from the past 24 hours).    Social History   Tobacco Use  Smoking Status Former   Current packs/day: 0.50   Average packs/day: 0.5 packs/day for 50.1 years (25.0 ttl pk-yrs)   Types: Cigarettes   Start date: 04/08/1973  Smokeless Tobacco Never    Goals Met:  Independence with exercise equipment No report of concerns or symptoms today Strength training completed today  Goals Unmet:  Not Applicable  Comments: Pt able to follow exercise prescription today without complaint.  Will continue to monitor for progression.

## 2023-05-06 ENCOUNTER — Encounter (HOSPITAL_COMMUNITY): Payer: BC Managed Care – PPO

## 2023-05-11 ENCOUNTER — Encounter (HOSPITAL_COMMUNITY)
Admission: RE | Admit: 2023-05-11 | Discharge: 2023-05-11 | Disposition: A | Discharge status: Home / Self Care | Payer: BC Managed Care – PPO | Source: Ambulatory Visit | Attending: Cardiology | Admitting: Cardiology

## 2023-05-11 DIAGNOSIS — Z951 Presence of aortocoronary bypass graft: Secondary | ICD-10-CM | POA: Diagnosis not present

## 2023-05-11 NOTE — Progress Notes (Signed)
 Daily Session Note  Patient Details  Name: Nicholas [name] MRN: [id number] Date of Birth: 1960/08/23 Referring Provider:   Flowsheet Row CARDIAC REHAB PHASE II ORIENTATION from 03/25/2023 in [facility] CARDIAC REHABILITATION  Referring Provider Linder Revere MD  [facility] Cardiologist: Dr. Hilario Lover McDowell]       Encounter Date: 05/11/2023  Check In:  Session Check In - 05/11/23 0745       Check-In   Supervising physician immediately available to respond to emergencies See telemetry face sheet for immediately available MD    Location [facility]    Staff Present Clotilda Danish, BS, Exercise Physiologist;Danny Claudean Crumbly, RN, BSN    Virtual Visit No    Medication changes reported     No    Fall or balance concerns reported    No    Tobacco Cessation No Change    Warm-up and Cool-down Performed on first and last piece of equipment    Resistance Training Performed Yes    VAD Patient? No    PAD/SET Patient? No      Pain Assessment   Currently in Pain? No/denies    Multiple Pain Sites No             Capillary Blood Glucose: No results found for this or any previous visit (from the past 24 hours).    Social History   Tobacco Use  Smoking Status Former   Current packs/day: 0.50   Average packs/day: 0.5 packs/day for 50.1 years (25.0 ttl pk-yrs)   Types: Cigarettes   Start date: 04/08/1973  Smokeless Tobacco Never    Goals Met:  Independence with exercise equipment Exercise tolerated well No report of concerns or symptoms today Strength training completed today  Goals Unmet:  Not Applicable  Comments: Pt able to follow exercise prescription today without complaint.  Will continue to monitor for progression.

## 2023-05-13 ENCOUNTER — Encounter (HOSPITAL_COMMUNITY): Payer: BC Managed Care – PPO

## 2023-05-18 ENCOUNTER — Encounter (HOSPITAL_COMMUNITY): Payer: BC Managed Care – PPO

## 2023-05-20 ENCOUNTER — Encounter (HOSPITAL_COMMUNITY): Payer: BC Managed Care – PPO

## 2023-05-20 ENCOUNTER — Encounter (HOSPITAL_COMMUNITY): Payer: Self-pay | Admitting: *Deleted

## 2023-05-20 DIAGNOSIS — Z951 Presence of aortocoronary bypass graft: Secondary | ICD-10-CM

## 2023-05-20 NOTE — Progress Notes (Signed)
 Cardiac Individual Treatment Plan  Patient Details  Name: Nicholas [name] MRN: [id number] Date of Birth: 01/22/1961 Referring Provider:   Flowsheet Row CARDIAC REHAB PHASE II ORIENTATION from 03/25/2023 in [facility] CARDIAC REHABILITATION  Referring Provider Linder Revere MD  [Primary Cardiologist: Dr. Hilario Lover McDowell]       Initial Encounter Date:  Flowsheet Row CARDIAC REHAB PHASE II ORIENTATION from 03/25/2023 in [place] CARDIAC REHABILITATION  Date 03/25/23       Visit Diagnosis: S/P CABG x 5  Patient's Home Medications on Admission:  Current Outpatient Medications:    acetaminophen  (TYLENOL ) 500 MG tablet, Take 500 mg by mouth every 6 (six) hours as needed (for headaches)., Disp: , Rfl:    albuterol  (VENTOLIN  HFA) 108 (90 Base) MCG/ACT inhaler, Inhale 1-2 puffs into the lungs every 6 (six) hours as needed for wheezing or shortness of breath., Disp: , Rfl:    aspirin  EC 325 MG tablet, Take 1 tablet (325 mg total) by mouth daily., Disp: 100 tablet, Rfl: 4   BD PEN NEEDLE NANO U/F 32G X 4 MM MISC, USE AS DIRECTED FOUR TIMES DAILY., Disp: 100 each, Rfl: 2   Coenzyme Q10 (COQ10) 100 MG CAPS, Take 100 mg by mouth daily., Disp: , Rfl:    Continuous Glucose Receiver (FREESTYLE LIBRE 3 READER) DEVI, 1 Piece by Does not apply route once as needed for up to 1 dose., Disp: 1 each, Rfl: 0   Continuous Glucose Sensor (FREESTYLE LIBRE 2 SENSOR) MISC, USE 1 EVERY 14 DAYS, Disp: 2 each, Rfl: 0   ezetimibe  (ZETIA ) 10 MG tablet, Take 1 tablet (10 mg total) by mouth daily., Disp: 30 tablet, Rfl: 5   gabapentin  (NEURONTIN ) 100 MG capsule, Take 100 mg by mouth daily. , Disp: , Rfl:    Insulin  Lispro Prot & Lispro (HUMALOG  MIX 75/25 KWIKPEN) (75-25) 100 UNIT/ML Kwikpen, Inject 60 Units into the skin 2 (two) times daily with a meal., Disp: 120 mL, Rfl: 0   metoprolol  tartrate (LOPRESSOR ) 50 MG tablet, Take 1 tablet (50 mg total) by mouth 2 (two) times daily., Disp: 180 tablet, Rfl: 3   nicotine   (NICODERM CQ  - DOSED IN MG/24 HR) 7 mg/24hr patch, Place 1 patch (7 mg total) onto the skin daily., Disp: 28 patch, Rfl: 0   nitroGLYCERIN  (NITROSTAT ) 0.4 MG SL tablet, DISSOLVE ONE TABLET UNDER THE TONGUE EVERY 5 MINUTES AS NEEDED FOR CHEST PAIN.  DO NOT EXCEED A TOTAL OF 3 DOSES IN 15 MINUTES, Disp: 25 tablet, Rfl: 3   nortriptyline  (PAMELOR ) 10 MG capsule, Take 4 capsules (40 mg total) by mouth at bedtime., Disp: 120 capsule, Rfl: 5   pantoprazole  (PROTONIX ) 40 MG tablet, Take 40 mg by mouth daily before breakfast., Disp: , Rfl:    rosuvastatin  (CRESTOR ) 40 MG tablet, Take 40 mg by mouth at bedtime., Disp: , Rfl:    Semaglutide  (RYBELSUS ) 3 MG TABS, Take 1 tablet (3 mg total) by mouth daily before breakfast., Disp: 90 tablet, Rfl: 0   Ubrogepant  (UBRELVY ) 100 MG TABS, Take 1 tablet (100 mg total) by mouth daily as needed. Take one tablet at onset of headache, may repeat 1 tablet in 2 hours, no more than 2 tablets in 24 hours, Disp: 10 tablet, Rfl: 11  Past Medical History: Past Medical History:  Diagnosis Date   Anal lesion    Anal papilla/tags - external lesion   Bilateral carpal tunnel syndrome 11/24/2018   Colon polyp    Hyperplastic rectosigmoid polyp 9/08  Coronary atherosclerosis of native coronary artery    DES to PLB, BMS to mid and distal RCA, DES to circumflex and distal LAD, 6/10 (5 stents)   Diabetic peripheral neuropathy (HCC) 11/24/2018   GERD (gastroesophageal reflux disease)    Headache    Migraines   Helicobacter pylori (H. pylori) 09/2009   Treated with helidac   Hiatal hernia    Pt denies   Hyperplastic colon polyp 2008   Mixed hyperlipidemia    Myocardial infarction (HCC) 06/2008   Seizures (HCC)    Related to stroke in 2018. None since then as of 01/2023   Stroke [facility])    January 2018   Type 2 diabetes mellitus (HCC)     Tobacco Use: Social History   Tobacco Use  Smoking Status Former   Current packs/day: 0.50   Average packs/day: 0.5 packs/day for  50.1 years (25.1 ttl pk-yrs)   Types: Cigarettes   Start date: 04/08/1973  Smokeless Tobacco Never    Labs: Review Flowsheet  More data exists      Latest Ref Rng & Units 07/09/2022 07/11/2022 10/24/2022 01/07/2023 02/09/2023  Labs for ITP Cardiac and Pulmonary Rehab  Cholestrol 100 - 199 mg/dL 161  - - 096  -  LDL (calc) 0 - 99 mg/dL 77  - - 92  -  HDL-C >04 mg/dL 32  - - 31  -  Trlycerides 0 - 149 mg/dL 540  - - 981  -  Hemoglobin A1c 4.8 - 5.6 % - 11.2  9.6  - 11.0   PH, Arterial 7.35 - 7.45 - - - - 7.329  7.349  7.332  7.362   PCO2 arterial 32 - 48 mmHg - - - - 45.4  45.0  42.6  39.5   Bicarbonate 20.0 - 28.0 mmol/L - - - - 24.0  24.8  22.6  23.9  22.4   TCO2 22 - 32 mmol/L - - - - 25  26  24  22  24  25  25  24  22  24    Acid-base deficit 0.0 - 2.0 mmol/L - - - - 2.0  1.0  3.0  2.0  3.0   O2 Saturation % - - - - 99  94  99  70  100     Details       Multiple values from one day are sorted in reverse-chronological order         Capillary Blood Glucose: Lab Results  Component Value Date   GLUCAP 143 (H) 04/13/2023   GLUCAP 122 (H) 04/13/2023   GLUCAP 182 (H) 04/08/2023   GLUCAP 126 (H) 04/08/2023   GLUCAP 208 (H) 04/06/2023     Exercise Target Goals: Exercise Program Goal: Individual exercise prescription set using results from initial 6 min walk test and THRR while considering  patient's activity barriers and safety.   Exercise Prescription Goal: Starting with aerobic activity 30 plus minutes a day, 3 days per week for initial exercise prescription. Provide home exercise prescription and guidelines that participant acknowledges understanding prior to discharge.  Activity Barriers & Risk Stratification:  Activity Barriers & Cardiac Risk Stratification - 03/25/23 0813       Activity Barriers & Cardiac Risk Stratification   Activity Barriers None    Cardiac Risk Stratification High             6 Minute Walk:  6 Minute Walk     Row Name 03/25/23 0930  6 Minute Walk   Phase Initial     Distance 1075 feet     Walk Time 6 minutes     # of Rest Breaks 0     MPH 2.92     METS 2.04     RPE 10     VO2 Peak 10.22     Symptoms No     Resting HR 88 bpm     Resting BP 90/62     Resting Oxygen Saturation  95 %     Exercise Oxygen Saturation  during 6 min walk 95 %     Max Ex. HR 95 bpm     Max Ex. BP 120/70     2 Minute Post BP 114/60              Oxygen Initial Assessment:   Oxygen Re-Evaluation:   Oxygen Discharge (Final Oxygen Re-Evaluation):   Initial Exercise Prescription:  Initial Exercise Prescription - 03/25/23 0900       Date of Initial Exercise RX and Referring Provider   Date 03/25/23    Referring Provider Linder Revere MD   Primary Cardiologist: Dr. Teddie Favre     Oxygen   Maintain Oxygen Saturation 88% or higher      Treadmill   MPH 2    Grade 0.5    Minutes 15    METs 2.67      REL-XR   Level 3    Speed 50    Minutes 15    METs 2.5      Prescription Details   Frequency (times per week) 3    Duration Progress to 30 minutes of continuous aerobic without signs/symptoms of physical distress      Intensity   THRR 40-80% of Max Heartrate 116-143    Ratings of Perceived Exertion 11-13    Perceived Dyspnea 0-4      Progression   Progression Continue to progress workloads to maintain intensity without signs/symptoms of physical distress.      Resistance Training   Training Prescription Yes    Weight 4 lb    Reps 10-15             Perform Capillary Blood Glucose checks as needed.  Exercise Prescription Changes:   Exercise Prescription Changes     Row Name 03/25/23 0900 04/13/23 1500 04/29/23 1300         Response to Exercise   Blood Pressure (Admit) 90/62 110/60 128/74     Blood Pressure (Exercise) 120/70 106/62 132/70     Blood Pressure (Exit) 114/60 102/60 106/62     Heart Rate (Admit) 88 bpm 50 bpm 63 bpm     Heart Rate (Exercise) 95 bpm 120 bpm 122 bpm      Heart Rate (Exit) 93 bpm 111 bpm 120 bpm     Oxygen Saturation (Admit) 95 % -- --     Oxygen Saturation (Exercise) 95 % -- --     Rating of Perceived Exertion (Exercise) 10 13 13      Symptoms none -- --     Comments walk test results -- --     Duration -- Continue with 30 min of aerobic exercise without signs/symptoms of physical distress. Continue with 30 min of aerobic exercise without signs/symptoms of physical distress.     Intensity -- THRR unchanged THRR unchanged       Progression   Progression -- Continue to progress workloads to maintain intensity without signs/symptoms of physical distress. Continue to  progress workloads to maintain intensity without signs/symptoms of physical distress.       Resistance Training   Training Prescription -- Yes Yes     Weight -- 4 4     Reps -- 10-15 10-15       Treadmill   MPH -- 1.7 2     Grade -- 1 1     Minutes -- 15 15     METs -- 2.54 2.81       REL-XR   Level -- 3 5     Speed -- 46 52     Minutes -- 15 15     METs -- 2.6 2.9              Exercise Comments:   Exercise Comments     Row Name 04/06/23 0754           Exercise Comments First full day of exercise!  Patient was oriented to gym and equipment including functions, settings, policies, and procedures.  Patient's individual exercise prescription and treatment plan were reviewed.  All starting workloads were established based on the results of the 6 minute walk test done at initial orientation visit.  The plan for exercise progression was also introduced and progression will be customized based on patient's performance and goals.                Exercise Goals and Review:   Exercise Goals     Row Name 03/25/23 0933             Exercise Goals   Increase Physical Activity Yes       Intervention Provide advice, education, support and counseling about physical activity/exercise needs.;Develop an individualized exercise prescription for aerobic and resistive  training based on initial evaluation findings, risk stratification, comorbidities and participant's personal goals.       Expected Outcomes Short Term: Attend rehab on a regular basis to increase amount of physical activity.;Long Term: Add in home exercise to make exercise part of routine and to increase amount of physical activity.;Long Term: Exercising regularly at least 3-5 days a week.       Increase Strength and Stamina Yes       Intervention Develop an individualized exercise prescription for aerobic and resistive training based on initial evaluation findings, risk stratification, comorbidities and participant's personal goals.;Provide advice, education, support and counseling about physical activity/exercise needs.       Expected Outcomes Short Term: Perform resistance training exercises routinely during rehab and add in resistance training at home;Short Term: Increase workloads from initial exercise prescription for resistance, speed, and METs.;Long Term: Improve cardiorespiratory fitness, muscular endurance and strength as measured by increased METs and functional capacity ( )       Able to understand and use rate of perceived exertion (RPE) scale Yes       Intervention Provide education and explanation on how to use RPE scale       Expected Outcomes Short Term: Able to use RPE daily in rehab to express subjective intensity level;Long Term:  Able to use RPE to guide intensity level when exercising independently       Able to understand and use Dyspnea scale Yes       Intervention Provide education and explanation on how to use Dyspnea scale       Expected Outcomes Short Term: Able to use Dyspnea scale daily in rehab to express subjective sense of shortness of breath during exertion;Long Term: Able to use Dyspnea scale  to guide intensity level when exercising independently       Knowledge and understanding of Target Heart Rate Range (THRR) Yes       Intervention Provide education and  explanation of THRR including how the numbers were predicted and where they are located for reference       Expected Outcomes Short Term: Able to state/look up THRR;Long Term: Able to use THRR to govern intensity when exercising independently;Short Term: Able to use daily as guideline for intensity in rehab       Able to check pulse independently Yes       Intervention Provide education and demonstration on how to check pulse in carotid and radial arteries.;Review the importance of being able to check your own pulse for safety during independent exercise       Expected Outcomes Short Term: Able to explain why pulse checking is important during independent exercise;Long Term: Able to check pulse independently and accurately       Understanding of Exercise Prescription Yes       Intervention Provide education, explanation, and written materials on patient's individual exercise prescription       Expected Outcomes Short Term: Able to explain program exercise prescription;Long Term: Able to explain home exercise prescription to exercise independently                Exercise Goals Re-Evaluation :  Exercise Goals Re-Evaluation     Row Name 04/06/23 0755 04/13/23 0839 05/04/23 0802         Exercise Goal Re-Evaluation   Exercise Goals Review Knowledge and understanding of Target Heart Rate Range (THRR);Able to understand and use rate of perceived exertion (RPE) scale Increase Strength and Stamina;Increase Physical Activity --     Comments Reviewed RPE and dyspnea scale, THR and program prescription with pt today.  Pt voiced understanding and was given a copy of goals to take home Patient states that outside of the program, he walks a little bit at home as he has to walk on job sites a lot. States he still doesn't have much energy, that he eats well and drinks water  all the time. Encouraged patient to talk to his Cardiologist about taking a multivitamin and getting some blood work drawn to check for  possible low Vit. D and Vit. B12 levels or other levels that could contribute to low energy. Pt states he will bring this up at his heart doctor appt which is April 14th. Joe states that most of his exercise comes from this program, he does do some walking outside of the program where he is busy on the job sites, but other than that this program is his main exercise. Eating and drinking well and still has his cardio follow up on the 14th.     Expected Outcomes Short: Use RPE daily to regulate intensity.  Long: Follow program prescription in THR. Short: Continue to attend program and exercise. Long: Exercise more at home and improve strength and stamina to regain energy levels. Short: Continue to attend program and exercise. Long: Exercise more at home and improve strength and stamina to regain energy levels.               Discharge Exercise Prescription (Final Exercise Prescription Changes):  Exercise Prescription Changes - 04/29/23 1300       Response to Exercise   Blood Pressure (Admit) 128/74    Blood Pressure (Exercise) 132/70    Blood Pressure (Exit) 106/62    Heart Rate (Admit) 63  bpm    Heart Rate (Exercise) 122 bpm    Heart Rate (Exit) 120 bpm    Rating of Perceived Exertion (Exercise) 13    Duration Continue with 30 min of aerobic exercise without signs/symptoms of physical distress.    Intensity THRR unchanged      Progression   Progression Continue to progress workloads to maintain intensity without signs/symptoms of physical distress.      Resistance Training   Training Prescription Yes    Weight 4    Reps 10-15      Treadmill   MPH 2    Grade 1    Minutes 15    METs 2.81      REL-XR   Level 5    Speed 52    Minutes 15    METs 2.9             Nutrition:  Target Goals: Understanding of nutrition guidelines, daily intake of sodium 1500mg , cholesterol 200mg , calories 30% from fat and 7% or less from saturated fats, daily to have 5 or more servings of  fruits and vegetables.  Biometrics:  Pre Biometrics - 03/25/23 0933       Pre Biometrics   Height 5\' 8"  (1.727 m)    Weight 165 lb 11.2 oz (75.2 kg)    Waist Circumference 35.5 inches    Hip Circumference 35 inches    Waist to Hip Ratio 1.01 %    BMI (Calculated) 25.2    Grip Strength 30.7 kg    Single Leg Stand 20.3 seconds              Nutrition Therapy Plan and Nutrition Goals:  Nutrition Therapy & Goals - 03/25/23 0934       Intervention Plan   Intervention Prescribe, educate and counsel regarding individualized specific dietary modifications aiming towards targeted core components such as weight, hypertension, lipid management, diabetes, heart failure and other comorbidities.;Nutrition handout(s) given to patient.    Expected Outcomes Short Term Goal: Understand basic principles of dietary content, such as calories, fat, sodium, cholesterol and nutrients.;Long Term Goal: Adherence to prescribed nutrition plan.             Nutrition Assessments:  MEDIFICTS Score Key: >=70 Need to make dietary changes  40-70 Heart Healthy Diet <= 40 Therapeutic Level Cholesterol Diet   Picture Your Plate Scores: <29 Unhealthy dietary pattern with much room for improvement. 41-50 Dietary pattern unlikely to meet recommendations for good health and room for improvement. 51-60 More healthful dietary pattern, with some room for improvement.  >60 Healthy dietary pattern, although there may be some specific behaviors that could be improved.    Nutrition Goals Re-Evaluation:  Nutrition Goals Re-Evaluation     Row Name 04/13/23 5621 05/04/23 0810           Goals   Nutrition Goal Eat healthy consistently. Eat healthy consistently.      Comment Patient states he eats healthy most of the time. States he will have a cheat day every now and then, but not often. Joe states that he eats healthy majority of the time and most of his liquid consumption is water .      Expected Outcome  Short term: Continue to eat healthy. Long term: Still eat healthy and have cheat days in moderation. Short term: Continue to eat healthy. Long term: Still eat healthy and have cheat days in moderation.               Nutrition  Goals Discharge (Final Nutrition Goals Re-Evaluation):  Nutrition Goals Re-Evaluation - 05/04/23 0810       Goals   Nutrition Goal Eat healthy consistently.    Comment Joe states that he eats healthy majority of the time and most of his liquid consumption is water .    Expected Outcome Short term: Continue to eat healthy. Long term: Still eat healthy and have cheat days in moderation.             Psychosocial: Target Goals: Acknowledge presence or absence of significant depression and/or stress, maximize coping skills, provide positive support system. Participant is able to verbalize types and ability to use techniques and skills needed for reducing stress and depression.  Initial Review & Psychosocial Screening:  Initial Psych Review & Screening - 03/25/23 0901       Initial Review   Current issues with None Identified      Family Dynamics   Good Support System? Yes      Barriers   Psychosocial barriers to participate in program Psychosocial barriers identified (see note)      Screening Interventions   Interventions Encouraged to exercise;Program counselor consult;Provide feedback about the scores to participant;To provide support and resources with identified psychosocial needs    Expected Outcomes Short Term goal: Utilizing psychosocial counselor, staff and physician to assist with identification of specific Stressors or current issues interfering with healing process. Setting desired goal for each stressor or current issue identified.;Long Term Goal: Stressors or current issues are controlled or eliminated.;Short Term goal: Identification and review with participant of any Quality of Life or Depression concerns found by scoring the questionnaire.;Long  Term goal: The participant improves quality of Life and PHQ9 Scores as seen by post scores and/or verbalization of changes             Quality of Life Scores:  Quality of Life - 03/25/23 1338       Quality of Life   Select Quality of Life      Quality of Life Scores   Health/Function Pre 17.1 %    Socioeconomic Pre 17.5 %    Psych/Spiritual Pre 17.5 %    Family Pre 17.5 %    GLOBAL Pre 17.33 %            Scores of 19 and below usually indicate a poorer quality of life in these areas.  A difference of  2-3 points is a clinically meaningful difference.  A difference of 2-3 points in the total score of the Quality of Life Index has been associated with significant improvement in overall quality of life, self-image, physical symptoms, and general health in studies assessing change in quality of life.  PHQ-9: Review Flowsheet  More data exists      03/25/2023 09/03/2017 04/21/2017 09/30/2016 09/11/2016  Depression screen PHQ 2/9  Decreased Interest 0 0 0 0 0  Down, Depressed, Hopeless 0 0 - 0 0  PHQ - 2 Score 0 0 0 0 0  Altered sleeping 0 - - - -  Tired, decreased energy 0 - - - -  Change in appetite 0 - - - -  Feeling bad or failure about yourself  0 - - - -  Trouble concentrating 0 - - - -  Moving slowly or fidgety/restless 0 - - - -  Suicidal thoughts 0 - - - -  PHQ-9 Score 0 - - - -   Interpretation of Total Score  Total Score Depression Severity:  1-4 = Minimal depression,  5-9 = Mild depression, 10-14 = Moderate depression, 15-19 = Moderately severe depression, 20-27 = Severe depression   Psychosocial Evaluation and Intervention:   Psychosocial Re-Evaluation:  Psychosocial Re-Evaluation     Row Name 03/25/23 1005 04/13/23 0828 05/04/23 0811         Psychosocial Re-Evaluation   Current issues with None Identified Current Sleep Concerns None Identified     Comments 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 His PHQ9 score was 0. He denies any depression, anxiety or  stressors. 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 He hopes to return to work soon. He quit smoking during his hospitalization for surgery and is using nicotine  patches some now but not as much as when he started. He says he does not want to smoke now. He lives with his wife who is him main support person along with his daughter and son. He did the program 14 years ago after 2 stents and seems to be very motivated to participate again. His goals for the program are to continue to abstain from smoking, learn what he is able to do; and get his strength and stamina back. He has no barriers identified to complete the program. Pt states overall he is sleeping well. He has some nights that he doesn't sleep good because he just isn't tired, and other nights he sleeps well. He says this has been going on for a while now though. He states he does not have any current stress concerns. Pt states overall he is sleeping well and doesn't have any stressors that are bothering him. The sleep issues that were bothering him are not resolved.     Expected Outcomes Short Term: start the program and attend consistently. Long Term: Complete the program and meet his personal goals. Short term: continue the program consistently. Long term: Complete the program and meet personal goals. Short term: continue the program consistently. Long term: Complete the program and meet personal goals.     Interventions Stress management education;Relaxation education;Encouraged to attend Cardiac Rehabilitation for the exercise Encouraged to attend Cardiac Rehabilitation for the exercise Encouraged to attend Cardiac Rehabilitation for the exercise     Continue Psychosocial Services  No Follow up required Follow up required by staff Follow up required by staff              Psychosocial Discharge (Final Psychosocial Re-Evaluation):  Psychosocial Re-Evaluation - 05/04/23 0811       Psychosocial Re-Evaluation    Current issues with None Identified    Comments Pt states overall he is sleeping well and doesn't have any stressors that are bothering him. The sleep issues that were bothering him are not resolved.    Expected Outcomes Short term: continue the program consistently. Long term: Complete the program and meet personal goals.    Interventions Encouraged to attend Cardiac Rehabilitation for the exercise    Continue Psychosocial Services  Follow up required by staff             Vocational Rehabilitation: Provide vocational rehab assistance to qualifying candidates.   Vocational Rehab Evaluation & Intervention:  Vocational Rehab - 03/25/23 0901       Initial Vocational Rehab Evaluation & Intervention   Assessment shows need for Vocational Rehabilitation No      Vocational Rehab Re-Evaulation   Comments Patient is self-employed and is returning to work soon.             Education: Education Goals: Education  classes will be provided on a weekly basis, covering required topics. Participant will state understanding/return demonstration of topics presented.  Learning Barriers/Preferences:  Learning Barriers/Preferences - 03/25/23 0827       Learning Barriers/Preferences   Learning Barriers None    Learning Preferences Skilled Demonstration;Audio;Written Material             Education Topics: Hypertension, Hypertension Reduction -Define heart disease and high blood pressure. Discus how high blood pressure affects the body and ways to reduce high blood pressure. Flowsheet Row CARDIAC REHAB PHASE II EXERCISE from 04/29/2023 in [place] CARDIAC REHABILITATION  Date 04/15/23  Educator [facility]  Instruction Review Code 1- Verbalizes Understanding       Exercise and Your Heart -Discuss why it is important to exercise, the FITT principles of exercise, normal and abnormal responses to exercise, and how to exercise safely.   Angina -Discuss definition of angina, causes of angina,  treatment of angina, and how to decrease risk of having angina.   Cardiac Medications -Review what the following cardiac medications are used for, how they affect the body, and side effects that may occur when taking the medications.  Medications include Aspirin , Beta blockers, calcium  channel blockers, ACE Inhibitors, angiotensin receptor blockers, diuretics, digoxin , and antihyperlipidemics.   Congestive Heart Failure -Discuss the definition of CHF, how to live with CHF, the signs and symptoms of CHF, and how keep track of weight and sodium intake. Flowsheet Row CARDIAC REHAB PHASE II EXERCISE from 04/29/2023 in [place] CARDIAC REHABILITATION  Date 04/15/23  Educator [facility]  Instruction Review Code 1- Verbalizes Understanding       Heart Disease and Intimacy -Discus the effect sexual activity has on the heart, how changes occur during intimacy as we age, and safety during sexual activity.   Smoking Cessation / COPD -Discuss different methods to quit smoking, the health benefits of quitting smoking, and the definition of COPD.   Nutrition I: Fats -Discuss the types of cholesterol, what cholesterol does to the heart, and how cholesterol levels can be controlled.   Nutrition II: Labels -Discuss the different components of food labels and how to read food label   Heart Parts/Heart Disease and PAD -Discuss the anatomy of the heart, the pathway of blood circulation through the heart, and these are affected by heart disease.   Stress I: Signs and Symptoms -Discuss the causes of stress, how stress may lead to anxiety and depression, and ways to limit stress. Flowsheet Row CARDIAC REHAB PHASE II EXERCISE from 04/29/2023 in [place] CARDIAC REHABILITATION  Date 04/08/23  Educator [facility]  Instruction Review Code 1- Verbalizes Understanding       Stress II: Relaxation -Discuss different types of relaxation techniques to limit stress.   Warning Signs of Stroke / TIA -Discuss  definition of a stroke, what the signs and symptoms are of a stroke, and how to identify when someone is having stroke.   Knowledge Questionnaire Score:  Knowledge Questionnaire Score - 03/25/23 0947       Knowledge Questionnaire Score   Post Score [id number] (P)              Core Components/Risk Factors/Patient Goals at Admission:  Personal Goals and Risk Factors at Admission - 03/25/23 0901       Core Components/Risk Factors/Patient Goals on Admission    Weight Management Weight Maintenance;Yes    Intervention Weight Management: Develop a combined nutrition and exercise program designed to reach desired caloric intake, while maintaining appropriate intake of nutrient  and fiber, sodium and fats, and appropriate energy expenditure required for the weight goal.;Weight Management: Provide education and appropriate resources to help participant work on and attain dietary goals.    Admit Weight 165 lb 11.2 oz (75.2 kg)    Goal Weight: Short Term 165 lb (74.8 kg)    Goal Weight: Long Term 165 lb (74.8 kg)    Expected Outcomes Short Term: Continue to assess and modify interventions until short term weight is achieved;Long Term: Adherence to nutrition and physical activity/exercise program aimed toward attainment of established weight goal;Weight Maintenance: Understanding of the daily nutrition guidelines, which includes 25-35% calories from fat, 7% or less cal from saturated fats, less than 200mg  cholesterol, less than 1.5gm of sodium, & 5 or more servings of fruits and vegetables daily    Diabetes Yes    Intervention Provide education about signs/symptoms and action to take for hypo/hyperglycemia.;Provide education about proper nutrition, including hydration, and aerobic/resistive exercise prescription along with prescribed medications to achieve blood glucose in normal ranges: Fasting glucose 65-99 mg/dL    Expected Outcomes Short Term: Participant verbalizes understanding of the signs/symptoms  and immediate care of hyper/hypoglycemia, proper foot care and importance of medication, aerobic/resistive exercise and nutrition plan for blood glucose control.;Long Term: Attainment of HbA1C < 7%.    Hypertension Yes    Intervention Provide education on lifestyle modifcations including regular physical activity/exercise, weight management, moderate sodium restriction and increased consumption of fresh fruit, vegetables, and low fat dairy, alcohol moderation, and smoking cessation.;Monitor prescription use compliance.    Expected Outcomes Short Term: Continued assessment and intervention until BP is < 140/81mm HG in hypertensive participants. < 130/81mm HG in hypertensive participants with diabetes, heart failure or chronic kidney disease.;Long Term: Maintenance of blood pressure at goal levels.    Lipids Yes    Intervention Provide education and support for participant on nutrition & aerobic/resistive exercise along with prescribed medications to achieve LDL 70mg , HDL >40mg .    Expected Outcomes Short Term: Participant states understanding of desired cholesterol values and is compliant with medications prescribed. Participant is following exercise prescription and nutrition guidelines.;Long Term: Cholesterol controlled with medications as prescribed, with individualized exercise RX and with personalized nutrition plan. Value goals: LDL < 70mg , HDL > 40 mg.             Core Components/Risk Factors/Patient Goals Review:   Goals and Risk Factor Review     Row Name 04/13/23 0835 05/04/23 0808           Core Components/Risk Factors/Patient Goals Review   Personal Goals Review Hypertension;Diabetes;Lipids Hypertension;Diabetes;Lipids      Review Patient states that he takes his medications like he is supposed to. The first time he came to visit us , he was unable to participate in exercise because of his blood sugar being to high. The reason was he forgot to take his long acting insulin  the night  before. Joe states he is more compliant with his medications and his blood sugars have been a lot better since he has came to see us  since. Patient states that he takes his medications like he is supposed to. The first time he came to visit us , he was unable to participate in exercise because of his blood sugar being to high. The reason was he forgot to take his long acting insulin  the night before. Joe states he is more compliant with his medications and his blood sugars have been a lot better since he has came to see us  since.  He states his blood sugars are still half and half with being under control and high.      Expected Outcomes Short: Continue taking medications as prescribed. Long: Continue medications and get blood sugar better under control with diet and exercise. Short: Continue taking medications as prescribed. Long: Continue medications and get blood sugar better under control with diet and exercise.               Core Components/Risk Factors/Patient Goals at Discharge (Final Review):   Goals and Risk Factor Review - 05/04/23 0808       Core Components/Risk Factors/Patient Goals Review   Personal Goals Review Hypertension;Diabetes;Lipids    Review Patient states that he takes his medications like he is supposed to. The first time he came to visit us , he was unable to participate in exercise because of his blood sugar being to high. The reason was he forgot to take his long acting insulin  the night before. Joe states he is more compliant with his medications and his blood sugars have been a lot better since he has came to see us  since. He states his blood sugars are still half and half with being under control and high.    Expected Outcomes Short: Continue taking medications as prescribed. Long: Continue medications and get blood sugar better under control with diet and exercise.             ITP Comments:  ITP Comments     Row Name 04/06/23 [phone] 04/22/23 0803 05/20/23 0822        ITP Comments First full day of exercise!  Patient was oriented to gym and equipment including functions, settings, policies, and procedures.  Patient's individual exercise prescription and treatment plan were reviewed.  All starting workloads were established based on the results of the 6 minute walk test done at initial orientation visit.  The plan for exercise progression was also introduced and progression will be customized based on patient's performance and goals. 30 day review completed. ITP sent to Dr. Armida Lander, Medical Director of Cardiac Rehab. Continue with ITP unless changes are made by physician. 30 day review completed. ITP sent to Dr. Armida Lander, Medical Director of Cardiac Rehab. Continue with ITP unless changes are made by physician. Asa Lauth has been out since 05/11/23.              Comments: 30 day review

## 2023-05-25 ENCOUNTER — Telehealth: Payer: Self-pay

## 2023-05-25 ENCOUNTER — Encounter (HOSPITAL_COMMUNITY): Admission: RE | Admit: 2023-05-25 | Payer: BC Managed Care – PPO | Source: Ambulatory Visit

## 2023-05-25 ENCOUNTER — Telehealth (HOSPITAL_COMMUNITY): Payer: Self-pay

## 2023-05-25 ENCOUNTER — Other Ambulatory Visit (HOSPITAL_COMMUNITY): Payer: Self-pay

## 2023-05-25 NOTE — Telephone Encounter (Signed)
 Pharmacy Patient Advocate Encounter   Received notification from CoverMyMeds that prior authorization for Ubrelvy  100MG  tablets is required/requested.   Insurance verification completed.   The patient is insured through [facility] .   Per test claim: PA required; PA started via CoverMyMeds. KEY BEUVPDYE . Waiting for clinical questions to populate.

## 2023-05-27 ENCOUNTER — Other Ambulatory Visit (HOSPITAL_COMMUNITY): Payer: Self-pay

## 2023-05-27 ENCOUNTER — Encounter (HOSPITAL_COMMUNITY): Payer: BC Managed Care – PPO

## 2023-05-27 NOTE — Telephone Encounter (Signed)
 Pharmacy Patient Advocate Encounter  Received notification from [employer] that Prior Authorization for Ubrelvy  100MG  tablets has been APPROVED from 05/27/2023 to 05/26/2024   PA #/Case ID/Reference #: PA Case ID #: [id number]

## 2023-05-27 NOTE — Telephone Encounter (Signed)
 Pharmacy Patient Advocate Encounter   Received notification from CoverMyMeds that prior authorization for Ubrelvy  100mg  Tablets is required/requested.   Insurance verification completed.   The patient is insured through [employer] .   Per test claim: PA required; PA submitted to above mentioned insurance via CoverMyMeds Key/confirmation #/EOC [id number] Status is pending

## 2023-06-01 ENCOUNTER — Telehealth (HOSPITAL_COMMUNITY): Payer: Self-pay

## 2023-06-01 ENCOUNTER — Encounter (HOSPITAL_COMMUNITY): Payer: BC Managed Care – PPO

## 2023-06-01 NOTE — Telephone Encounter (Signed)
 Nicholas [name] is out of town for work and is planning out returning to rehab when he gets back.

## 2023-06-02 ENCOUNTER — Other Ambulatory Visit: Payer: Self-pay

## 2023-06-02 DIAGNOSIS — E1159 Type 2 diabetes mellitus with other circulatory complications: Secondary | ICD-10-CM

## 2023-06-02 MED ORDER — FREESTYLE LIBRE 3 PLUS SENSOR MISC: 0 refills | Status: DC

## 2023-06-03 ENCOUNTER — Ambulatory Visit: Payer: Self-pay | Attending: Cardiology | Admitting: Cardiology

## 2023-06-03 ENCOUNTER — Encounter: Payer: Self-pay | Admitting: Cardiology

## 2023-06-03 ENCOUNTER — Encounter (HOSPITAL_COMMUNITY): Payer: BC Managed Care – PPO

## 2023-06-03 VITALS — BP 108/68 | HR 96 | Ht 69.0 in | Wt 175.0 lb

## 2023-06-03 DIAGNOSIS — E782 Mixed hyperlipidemia: Secondary | ICD-10-CM

## 2023-06-03 DIAGNOSIS — I25119 Atherosclerotic heart disease of native coronary artery with unspecified angina pectoris: Secondary | ICD-10-CM

## 2023-06-03 DIAGNOSIS — I5020 Unspecified systolic (congestive) heart failure: Secondary | ICD-10-CM

## 2023-06-03 MED ORDER — METOPROLOL TARTRATE 50 MG PO TABS: 50.0000 mg | ORAL_TABLET | Freq: Two times a day (BID) | ORAL | 3 refills | Status: DC

## 2023-06-03 NOTE — Addendum Note (Signed)
 Addended by: Onyx Schirmer A on: 06/03/2023 10:36 AM   Modules accepted: Orders

## 2023-06-03 NOTE — Progress Notes (Signed)
 Cardiology Office Note  Date: 06/03/2023   ID: YEICOB RODELO, DOB October 26, 1960, MRN [id number]  History of Present Illness: WRIGHT MOLLE is a 63 y.o. male last seen in December 2024.  He is here for a routine visit.  He underwent cardiac catheterization in December 2024 revealing progressive multivessel CAD and ultimately underwent CABG in January of this year including LIMA to LAD, SVG to first diagonal, SVG to second diagonal, SVG to OM, SVG to PL of RCA.  He has recuperated well, plans to finish cardiac rehabilitation (has missed a few visits due to work).  He does not report any angina or nitroglycerin  use, NYHA class I dyspnea, no leg swelling.  Has had some leg fatigue.  We went over his medications.  He reports compliance with current regimen.  We discussed getting a follow-up echocardiogram for reassessment of LVEF and also a lipid profile.  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  He tells me that he quit smoking, has done very well with this.  Physical Exam: VS:  BP 108/68   Pulse 96   Ht 5\' 9"  (1.753 m)   Wt 175 lb (79.4 kg)   SpO2 98%   BMI 25.84 kg/m , BMI Body mass index is 25.84 kg/m.  Wt Readings from Last 3 Encounters:  06/03/23 175 lb (79.4 kg)  03/25/23 165 lb 11.2 oz (75.2 kg)  03/18/23 169 lb (76.7 kg)    General: Patient appears comfortable at rest. HEENT: Conjunctiva and lids normal. Neck: Supple, no elevated JVP or carotid bruits. Lungs: Clear to auscultation, nonlabored breathing at rest. Thorax: Well-healed sternal incision. Cardiac: Regular rate and rhythm, no S3 or significant systolic murmur, no pericardial rub. Extremities: No pitting edema.  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  Labwork: 07/09/2022: TSH 2.530 02/09/2023: ALT 25;  AST 21 02/11/2023: BUN 14; Creatinine, Ser 1.00; Hemoglobin 11.7; Platelets 125; Potassium 4.1; Sodium 134 02/14/2023: Magnesium  2.0     Component Value Date/Time   CHOL 167 01/07/2023 0803   TRIG 260 (H) 01/07/2023 0803   HDL 31 (L) 01/07/2023 0803   CHOLHDL 5.4 (H) 01/07/2023 0803   CHOLHDL 7.7 (H) 08/30/2020 0741   VLDL 20 05/11/2020 0224   LDLCALC 92 01/07/2023 0803   LDLCALC 139 (H) 08/30/2020 0741   Other Studies Reviewed Today:  Cardiac catheterization 01/13/2023:   Ost RCA to Prox RCA lesion is 100% stenosed.   Prox RCA to Mid RCA lesion is 100% stenosed.   Dist RCA lesion is 100% stenosed.   RPAV lesion is 100% stenosed.   Prox LAD lesion is 80% stenosed.   Mid LAD lesion is 80% stenosed.   Prox Cx to Mid Cx lesion is 100% stenosed.   1st Mrg lesion is 90% stenosed.   Severe multivessel coronary artery disease Severe calcific proximal and mid LAD stenoses of 80% Severe in-stent restenosis in the first obtuse marginal branch and total occlusion of the mid circumflex beyond the OM Total occlusion of the RCA at the ostium Collateral filling of the right posterolateral branch and second OM  Assessment and Plan:  1.  CAD status post DES to the PLB, BMS to the mid and distal RCA, and DES to the circumflex and distal LAD in 2010.  He had progressive multivessel disease documented at cardiac  catheterization in December 2024 and ultimately underwent CABG in January of this year including LIMA to LAD, SVG to first diagonal, SVG to second diagonal, SVG to OM, and SVG to PL of RCA.  LVEF 40 to 45% by intraoperative TEE in January.  He is doing very well, plans to complete cardiac rehabilitation and then exercise on his own.  No active angina or interval nitroglycerin  use.  ECG reviewed and stable.  Continue aspirin  325 mg daily, Crestor  40 mg daily, Zetia  10 mg daily, Lopressor  50 mg twice daily, and as needed nitroglycerin .  Plan to follow-up with echocardiogram for reassessment of  LVEF.   2.  Mixed hyperlipidemia.  LDL 92 in December 2024.  Continue Crestor  40 mg daily and Zetia  10 mg daily.  Recheck FLP.  3.  Asymptomatic carotid artery disease, 1 to 39% bilateral ICA stenosis by carotid Dopplers in January.  Continue aspirin  and statin.  4.  Tobacco abuse in remission.  Disposition:  Follow up  6 months.  Signed, Gerard Knight, M.D., F.A.C.C. [facility]

## 2023-06-03 NOTE — Patient Instructions (Signed)
 Medication Instructions:  Your physician recommends that you continue on your current medications as directed. Please refer to the Current Medication list given to you today.   Labwork: Fasting Lipids   Testing/Procedures: Your physician has requested that you have an echocardiogram. Echocardiography is a painless test that uses sound waves to create images of your heart. It provides your doctor with information about the size and shape of your heart and how well your heart's chambers and valves are working. This procedure takes approximately one hour. There are no restrictions for this procedure. Please do NOT wear cologne, perfume, aftershave, or lotions (deodorant is allowed). Please arrive 15 minutes prior to your appointment time.  Please note: We ask at that you not bring children with you during ultrasound (echo/ vascular) testing. Due to room size and safety concerns, children are not allowed in the ultrasound rooms during exams. Our front office staff cannot provide observation of children in our lobby area while testing is being conducted. An adult accompanying a patient to their appointment will only be allowed in the ultrasound room at the discretion of the ultrasound technician under special circumstances. We apologize for any inconvenience.   Follow-Up: 6 months  Any Other Special Instructions Will Be Listed Below (If Applicable).  If you need a refill on your cardiac medications before your next appointment, please call your pharmacy.

## 2023-06-08 ENCOUNTER — Other Ambulatory Visit (HOSPITAL_COMMUNITY)
Admission: RE | Admit: 2023-06-08 | Discharge: 2023-06-08 | Disposition: A | Discharge status: Home / Self Care | Source: Ambulatory Visit | Attending: Cardiology | Admitting: Cardiology

## 2023-06-08 ENCOUNTER — Encounter (HOSPITAL_COMMUNITY): Payer: BC Managed Care – PPO

## 2023-06-08 ENCOUNTER — Telehealth: Payer: Self-pay

## 2023-06-08 DIAGNOSIS — I25119 Atherosclerotic heart disease of native coronary artery with unspecified angina pectoris: Secondary | ICD-10-CM | POA: Insufficient documentation

## 2023-06-08 LAB — LIPID PANEL
Cholesterol: 98 mg/dL (ref 0–200)
HDL: 29 mg/dL — ABNORMAL LOW (ref >40)
LDL Cholesterol: 19 mg/dL (ref 0–99)
Total CHOL/HDL Ratio: 3.4 ratio
Triglycerides: 248 mg/dL — ABNORMAL HIGH (ref <150)
VLDL: 50 mg/dL — ABNORMAL HIGH (ref 0–40)

## 2023-06-08 NOTE — Telephone Encounter (Signed)
-----   Message from Teddie Favre sent at 06/08/2023 10:11 AM EDT ----- Results reviewed.  LDL has improved substantially down to 19, HDL low at 29.  Would suggest stopping Zetia , can continue Crestor .  Triglycerides remain elevated.  Suggest replacing Zetia  with omega-3 supplements starting at 1000 mg twice daily.

## 2023-06-08 NOTE — Telephone Encounter (Signed)
 The patient has been notified of the result and verbalized understanding.  All questions (if any) were answered. Annabel Barns, RN 06/08/2023 10:17 AM    Patient agrees to stop Zetia  and switch to Omega-3     1,000 mg bid

## 2023-06-09 ENCOUNTER — Ambulatory Visit: Payer: BC Managed Care – PPO | Admitting: "Endocrinology

## 2023-06-10 ENCOUNTER — Encounter (HOSPITAL_COMMUNITY): Payer: BC Managed Care – PPO

## 2023-06-15 ENCOUNTER — Encounter (HOSPITAL_COMMUNITY): Payer: BC Managed Care – PPO

## 2023-06-17 ENCOUNTER — Encounter (HOSPITAL_COMMUNITY): Payer: BC Managed Care – PPO | Attending: Cardiology

## 2023-06-17 ENCOUNTER — Encounter (HOSPITAL_COMMUNITY): Payer: Self-pay | Admitting: *Deleted

## 2023-06-17 DIAGNOSIS — Z951 Presence of aortocoronary bypass graft: Secondary | ICD-10-CM | POA: Insufficient documentation

## 2023-06-17 NOTE — Progress Notes (Signed)
 Cardiac Individual Treatment Plan  Patient Details  Name: Nicholas [name] MRN: [id number] Date of Birth: Apr 17, 1960 Referring Provider:   Flowsheet Row CARDIAC REHAB PHASE II ORIENTATION from 03/25/2023 in [facility] CARDIAC REHABILITATION  Referring Provider Linder Revere MD  [Primary Cardiologist: Dr. Hilario Lover McDowell]       Initial Encounter Date:  Flowsheet Row CARDIAC REHAB PHASE II ORIENTATION from 03/25/2023 in [place] CARDIAC REHABILITATION  Date 03/25/23       Visit Diagnosis: S/P CABG x 5  Patient's Home Medications on Admission:  Current Outpatient Medications:    acetaminophen  (TYLENOL ) 500 MG tablet, Take 500 mg by mouth every 6 (six) hours as needed (for headaches)., Disp: , Rfl:    albuterol  (VENTOLIN  HFA) 108 (90 Base) MCG/ACT inhaler, Inhale 1-2 puffs into the lungs every 6 (six) hours as needed for wheezing or shortness of breath., Disp: , Rfl:    aspirin  EC 325 MG tablet, Take 1 tablet (325 mg total) by mouth daily., Disp: 100 tablet, Rfl: 4   BD PEN NEEDLE NANO U/F 32G X 4 MM MISC, USE AS DIRECTED FOUR TIMES DAILY., Disp: 100 each, Rfl: 2   Coenzyme Q10 (COQ10) 100 MG CAPS, Take 100 mg by mouth daily., Disp: , Rfl:    Continuous Glucose Receiver (FREESTYLE LIBRE 3 READER) DEVI, 1 Piece by Does not apply route once as needed for up to 1 dose., Disp: 1 each, Rfl: 0   Continuous Glucose Sensor (FREESTYLE LIBRE 3 PLUS SENSOR) MISC, Use to monitor glucose continuously as directed. Change sensor every 15 days., Disp: 6 each, Rfl: 0   gabapentin  (NEURONTIN ) 100 MG capsule, Take 100 mg by mouth daily. , Disp: , Rfl:    Insulin  Lispro Prot & Lispro (HUMALOG  MIX 75/25 KWIKPEN) (75-25) 100 UNIT/ML Kwikpen, Inject 60 Units into the skin 2 (two) times daily with a meal., Disp: 120 mL, Rfl: 0   metoprolol  tartrate (LOPRESSOR ) 50 MG tablet, Take 1 tablet (50 mg total) by mouth 2 (two) times daily., Disp: 180 tablet, Rfl: 3   nicotine  (NICODERM CQ  - DOSED IN MG/24 HR) 7 mg/24hr  patch, Place 1 patch (7 mg total) onto the skin daily., Disp: 28 patch, Rfl: 0   nitroGLYCERIN  (NITROSTAT ) 0.4 MG SL tablet, DISSOLVE ONE TABLET UNDER THE TONGUE EVERY 5 MINUTES AS NEEDED FOR CHEST PAIN.  DO NOT EXCEED A TOTAL OF 3 DOSES IN 15 MINUTES, Disp: 25 tablet, Rfl: 3   nortriptyline  (PAMELOR ) 10 MG capsule, Take 4 capsules (40 mg total) by mouth at bedtime., Disp: 120 capsule, Rfl: 5   pantoprazole  (PROTONIX ) 40 MG tablet, Take 40 mg by mouth daily before breakfast., Disp: , Rfl:    rosuvastatin  (CRESTOR ) 40 MG tablet, Take 40 mg by mouth at bedtime., Disp: , Rfl:    Semaglutide  (RYBELSUS ) 3 MG TABS, Take 1 tablet (3 mg total) by mouth daily before breakfast., Disp: 90 tablet, Rfl: 0   Ubrogepant  (UBRELVY ) 100 MG TABS, Take 1 tablet (100 mg total) by mouth daily as needed. Take one tablet at onset of headache, may repeat 1 tablet in 2 hours, no more than 2 tablets in 24 hours, Disp: 10 tablet, Rfl: 11  Past Medical History: Past Medical History:  Diagnosis Date   Anal lesion    Anal papilla/tags - external lesion   Bilateral carpal tunnel syndrome 11/24/2018   Colon polyp    Hyperplastic rectosigmoid polyp 9/08   Coronary atherosclerosis of native coronary artery    DES to PLB, BMS  to mid and distal RCA, DES to circumflex and distal LAD, 6/10 (5 stents)   Diabetic peripheral neuropathy (HCC) 11/24/2018   GERD (gastroesophageal reflux disease)    Headache    Migraines   Helicobacter pylori (H. pylori) 09/2009   Treated with helidac   Hiatal hernia    Pt denies   Hyperplastic colon polyp 2008   Mixed hyperlipidemia    Myocardial infarction (HCC) 06/2008   Seizures (HCC)    Related to stroke in 2018. None since then as of 01/2023   Stroke [facility])    January 2018   Type 2 diabetes mellitus (HCC)     Tobacco Use: Social History   Tobacco Use  Smoking Status Former   Current packs/day: 0.50   Average packs/day: 0.5 packs/day for 50.2 years (25.1 ttl pk-yrs)   Types:  Cigarettes   Start date: 04/08/1973  Smokeless Tobacco Never    Labs: Review Flowsheet  More data exists      Latest Ref Rng & Units 07/11/2022 10/24/2022 01/07/2023 02/09/2023 06/08/2023  Labs for ITP Cardiac and Pulmonary Rehab  Cholestrol 0 - 200 mg/dL - - 409  - 98   LDL (calc) 0 - 99 mg/dL - - 92  - 19   HDL-C >81 mg/dL - - 31  - 29   Trlycerides <150 mg/dL - - 191  - 478   Hemoglobin A1c 4.8 - 5.6 % 11.2  9.6  - 11.0  -  PH, Arterial 7.35 - 7.45 - - - 7.329  7.349  7.332  7.362  -  PCO2 arterial 32 - 48 mmHg - - - 45.4  45.0  42.6  39.5  -  Bicarbonate 20.0 - 28.0 mmol/L - - - 24.0  24.8  22.6  23.9  22.4  -  TCO2 22 - 32 mmol/L - - - 25  26  24  22  24  25  25  24  22  24   -  Acid-base deficit 0.0 - 2.0 mmol/L - - - 2.0  1.0  3.0  2.0  3.0  -  O2 Saturation % - - - 99  94  99  70  100  -    Details       Multiple values from one day are sorted in reverse-chronological order         Capillary Blood Glucose: Lab Results  Component Value Date   GLUCAP 143 (H) 04/13/2023   GLUCAP 122 (H) 04/13/2023   GLUCAP 182 (H) 04/08/2023   GLUCAP 126 (H) 04/08/2023   GLUCAP 208 (H) 04/06/2023     Exercise Target Goals: Exercise Program Goal: Individual exercise prescription set using results from initial 6 min walk test and THRR while considering  patient's activity barriers and safety.   Exercise Prescription Goal: Starting with aerobic activity 30 plus minutes a day, 3 days per week for initial exercise prescription. Provide home exercise prescription and guidelines that participant acknowledges understanding prior to discharge.  Activity Barriers & Risk Stratification:  Activity Barriers & Cardiac Risk Stratification - 03/25/23 0813       Activity Barriers & Cardiac Risk Stratification   Activity Barriers None    Cardiac Risk Stratification High             6 Minute Walk:  6 Minute Walk     Row Name 03/25/23 0930         6 Minute Walk   Phase Initial      Distance 1075  feet     Walk Time 6 minutes     # of Rest Breaks 0     MPH 2.92     METS 2.04     RPE 10     VO2 Peak 10.22     Symptoms No     Resting HR 88 bpm     Resting BP 90/62     Resting Oxygen Saturation  95 %     Exercise Oxygen Saturation  during 6 min walk 95 %     Max Ex. HR 95 bpm     Max Ex. BP 120/70     2 Minute Post BP 114/60              Oxygen Initial Assessment:   Oxygen Re-Evaluation:   Oxygen Discharge (Final Oxygen Re-Evaluation):   Initial Exercise Prescription:  Initial Exercise Prescription - 03/25/23 0900       Date of Initial Exercise RX and Referring Provider   Date 03/25/23    Referring Provider Linder Revere MD   Primary Cardiologist: Dr. Teddie Favre     Oxygen   Maintain Oxygen Saturation 88% or higher      Treadmill   MPH 2    Grade 0.5    Minutes 15    METs 2.67      REL-XR   Level 3    Speed 50    Minutes 15    METs 2.5      Prescription Details   Frequency (times per week) 3    Duration Progress to 30 minutes of continuous aerobic without signs/symptoms of physical distress      Intensity   THRR 40-80% of Max Heartrate 116-143    Ratings of Perceived Exertion 11-13    Perceived Dyspnea 0-4      Progression   Progression Continue to progress workloads to maintain intensity without signs/symptoms of physical distress.      Resistance Training   Training Prescription Yes    Weight 4 lb    Reps 10-15             Perform Capillary Blood Glucose checks as needed.  Exercise Prescription Changes:   Exercise Prescription Changes     Row Name 03/25/23 0900 04/13/23 1500 04/29/23 1300         Response to Exercise   Blood Pressure (Admit) 90/62 110/60 128/74     Blood Pressure (Exercise) 120/70 106/62 132/70     Blood Pressure (Exit) 114/60 102/60 106/62     Heart Rate (Admit) 88 bpm 50 bpm 63 bpm     Heart Rate (Exercise) 95 bpm 120 bpm 122 bpm     Heart Rate (Exit) 93 bpm 111 bpm 120 bpm      Oxygen Saturation (Admit) 95 % -- --     Oxygen Saturation (Exercise) 95 % -- --     Rating of Perceived Exertion (Exercise) 10 13 13      Symptoms none -- --     Comments walk test results -- --     Duration -- Continue with 30 min of aerobic exercise without signs/symptoms of physical distress. Continue with 30 min of aerobic exercise without signs/symptoms of physical distress.     Intensity -- THRR unchanged THRR unchanged       Progression   Progression -- Continue to progress workloads to maintain intensity without signs/symptoms of physical distress. Continue to progress workloads to maintain intensity without signs/symptoms of physical distress.  Resistance Training   Training Prescription -- Yes Yes     Weight -- 4 4     Reps -- 10-15 10-15       Treadmill   MPH -- 1.7 2     Grade -- 1 1     Minutes -- 15 15     METs -- 2.54 2.81       REL-XR   Level -- 3 5     Speed -- 46 52     Minutes -- 15 15     METs -- 2.6 2.9              Exercise Comments:   Exercise Comments     Row Name 04/06/23 0754           Exercise Comments First full day of exercise!  Patient was oriented to gym and equipment including functions, settings, policies, and procedures.  Patient's individual exercise prescription and treatment plan were reviewed.  All starting workloads were established based on the results of the 6 minute walk test done at initial orientation visit.  The plan for exercise progression was also introduced and progression will be customized based on patient's performance and goals.                Exercise Goals and Review:   Exercise Goals     Row Name 03/25/23 0933             Exercise Goals   Increase Physical Activity Yes       Intervention Provide advice, education, support and counseling about physical activity/exercise needs.;Develop an individualized exercise prescription for aerobic and resistive training based on initial evaluation findings,  risk stratification, comorbidities and participant's personal goals.       Expected Outcomes Short Term: Attend rehab on a regular basis to increase amount of physical activity.;Long Term: Add in home exercise to make exercise part of routine and to increase amount of physical activity.;Long Term: Exercising regularly at least 3-5 days a week.       Increase Strength and Stamina Yes       Intervention Develop an individualized exercise prescription for aerobic and resistive training based on initial evaluation findings, risk stratification, comorbidities and participant's personal goals.;Provide advice, education, support and counseling about physical activity/exercise needs.       Expected Outcomes Short Term: Perform resistance training exercises routinely during rehab and add in resistance training at home;Short Term: Increase workloads from initial exercise prescription for resistance, speed, and METs.;Long Term: Improve cardiorespiratory fitness, muscular endurance and strength as measured by increased METs and functional capacity ( )       Able to understand and use rate of perceived exertion (RPE) scale Yes       Intervention Provide education and explanation on how to use RPE scale       Expected Outcomes Short Term: Able to use RPE daily in rehab to express subjective intensity level;Long Term:  Able to use RPE to guide intensity level when exercising independently       Able to understand and use Dyspnea scale Yes       Intervention Provide education and explanation on how to use Dyspnea scale       Expected Outcomes Short Term: Able to use Dyspnea scale daily in rehab to express subjective sense of shortness of breath during exertion;Long Term: Able to use Dyspnea scale to guide intensity level when exercising independently       Knowledge and understanding  of Target Heart Rate Range (THRR) Yes       Intervention Provide education and explanation of THRR including how the numbers were  predicted and where they are located for reference       Expected Outcomes Short Term: Able to state/look up THRR;Long Term: Able to use THRR to govern intensity when exercising independently;Short Term: Able to use daily as guideline for intensity in rehab       Able to check pulse independently Yes       Intervention Provide education and demonstration on how to check pulse in carotid and radial arteries.;Review the importance of being able to check your own pulse for safety during independent exercise       Expected Outcomes Short Term: Able to explain why pulse checking is important during independent exercise;Long Term: Able to check pulse independently and accurately       Understanding of Exercise Prescription Yes       Intervention Provide education, explanation, and written materials on patient's individual exercise prescription       Expected Outcomes Short Term: Able to explain program exercise prescription;Long Term: Able to explain home exercise prescription to exercise independently                Exercise Goals Re-Evaluation :  Exercise Goals Re-Evaluation     Row Name 04/06/23 0755 04/13/23 0839 05/04/23 0802         Exercise Goal Re-Evaluation   Exercise Goals Review Knowledge and understanding of Target Heart Rate Range (THRR);Able to understand and use rate of perceived exertion (RPE) scale Increase Strength and Stamina;Increase Physical Activity --     Comments Reviewed RPE and dyspnea scale, THR and program prescription with pt today.  Pt voiced understanding and was given a copy of goals to take home Patient states that outside of the program, he walks a little bit at home as he has to walk on job sites a lot. States he still doesn't have much energy, that he eats well and drinks water  all the time. Encouraged patient to talk to his Cardiologist about taking a multivitamin and getting some blood work drawn to check for possible low Vit. D and Vit. B12 levels or other  levels that could contribute to low energy. Pt states he will bring this up at his heart doctor appt which is April 14th. Nicholas [name] states that most of his exercise comes from this program, he does do some walking outside of the program where he is busy on the job sites, but other than that this program is his main exercise. Eating and drinking well and still has his cardio follow up on the 14th.     Expected Outcomes Short: Use RPE daily to regulate intensity.  Long: Follow program prescription in THR. Short: Continue to attend program and exercise. Long: Exercise more at home and improve strength and stamina to regain energy levels. Short: Continue to attend program and exercise. Long: Exercise more at home and improve strength and stamina to regain energy levels.               Discharge Exercise Prescription (Final Exercise Prescription Changes):  Exercise Prescription Changes - 04/29/23 1300       Response to Exercise   Blood Pressure (Admit) 128/74    Blood Pressure (Exercise) 132/70    Blood Pressure (Exit) 106/62    Heart Rate (Admit) 63 bpm    Heart Rate (Exercise) 122 bpm    Heart Rate (Exit) 120  bpm    Rating of Perceived Exertion (Exercise) 13    Duration Continue with 30 min of aerobic exercise without signs/symptoms of physical distress.    Intensity THRR unchanged      Progression   Progression Continue to progress workloads to maintain intensity without signs/symptoms of physical distress.      Resistance Training   Training Prescription Yes    Weight 4    Reps 10-15      Treadmill   MPH 2    Grade 1    Minutes 15    METs 2.81      REL-XR   Level 5    Speed 52    Minutes 15    METs 2.9             Nutrition:  Target Goals: Understanding of nutrition guidelines, daily intake of sodium 1500mg , cholesterol 200mg , calories 30% from fat and 7% or less from saturated fats, daily to have 5 or more servings of fruits and vegetables.  Biometrics:  Pre  Biometrics - 03/25/23 0933       Pre Biometrics   Height 5\' 8"  (1.727 m)    Weight 165 lb 11.2 oz (75.2 kg)    Waist Circumference 35.5 inches    Hip Circumference 35 inches    Waist to Hip Ratio 1.01 %    BMI (Calculated) 25.2    Grip Strength 30.7 kg    Single Leg Stand 20.3 seconds              Nutrition Therapy Plan and Nutrition Goals:  Nutrition Therapy & Goals - 03/25/23 0934       Intervention Plan   Intervention Prescribe, educate and counsel regarding individualized specific dietary modifications aiming towards targeted core components such as weight, hypertension, lipid management, diabetes, heart failure and other comorbidities.;Nutrition handout(s) given to patient.    Expected Outcomes Short Term Goal: Understand basic principles of dietary content, such as calories, fat, sodium, cholesterol and nutrients.;Long Term Goal: Adherence to prescribed nutrition plan.             Nutrition Assessments:  MEDIFICTS Score Key: >=70 Need to make dietary changes  40-70 Heart Healthy Diet <= 40 Therapeutic Level Cholesterol Diet   Picture Your Plate Scores: <25 Unhealthy dietary pattern with much room for improvement. 41-50 Dietary pattern unlikely to meet recommendations for good health and room for improvement. 51-60 More healthful dietary pattern, with some room for improvement.  >60 Healthy dietary pattern, although there may be some specific behaviors that could be improved.    Nutrition Goals Re-Evaluation:  Nutrition Goals Re-Evaluation     Row Name 04/13/23 9563 05/04/23 0810           Goals   Nutrition Goal Eat healthy consistently. Eat healthy consistently.      Comment Patient states he eats healthy most of the time. States he will have a cheat day every now and then, but not often. Nicholas [name] states that he eats healthy majority of the time and most of his liquid consumption is water .      Expected Outcome Short term: Continue to eat healthy. Long  term: Still eat healthy and have cheat days in moderation. Short term: Continue to eat healthy. Long term: Still eat healthy and have cheat days in moderation.               Nutrition Goals Discharge (Final Nutrition Goals Re-Evaluation):  Nutrition Goals Re-Evaluation - 05/04/23 8756  Goals   Nutrition Goal Eat healthy consistently.    Comment Nicholas [name] states that he eats healthy majority of the time and most of his liquid consumption is water .    Expected Outcome Short term: Continue to eat healthy. Long term: Still eat healthy and have cheat days in moderation.             Psychosocial: Target Goals: Acknowledge presence or absence of significant depression and/or stress, maximize coping skills, provide positive support system. Participant is able to verbalize types and ability to use techniques and skills needed for reducing stress and depression.  Initial Review & Psychosocial Screening:  Initial Psych Review & Screening - 03/25/23 0901       Initial Review   Current issues with None Identified      Family Dynamics   Good Support System? Yes      Barriers   Psychosocial barriers to participate in program Psychosocial barriers identified (see note)      Screening Interventions   Interventions Encouraged to exercise;Program counselor consult;Provide feedback about the scores to participant;To provide support and resources with identified psychosocial needs    Expected Outcomes Short Term goal: Utilizing psychosocial counselor, staff and physician to assist with identification of specific Stressors or current issues interfering with healing process. Setting desired goal for each stressor or current issue identified.;Long Term Goal: Stressors or current issues are controlled or eliminated.;Short Term goal: Identification and review with participant of any Quality of Life or Depression concerns found by scoring the questionnaire.;Long Term goal: The participant improves  quality of Life and PHQ9 Scores as seen by post scores and/or verbalization of changes             Quality of Life Scores:  Quality of Life - 03/25/23 1338       Quality of Life   Select Quality of Life      Quality of Life Scores   Health/Function Pre 17.1 %    Socioeconomic Pre 17.5 %    Psych/Spiritual Pre 17.5 %    Family Pre 17.5 %    GLOBAL Pre 17.33 %            Scores of 19 and below usually indicate a poorer quality of life in these areas.  A difference of  2-3 points is a clinically meaningful difference.  A difference of 2-3 points in the total score of the Quality of Life Index has been associated with significant improvement in overall quality of life, self-image, physical symptoms, and general health in studies assessing change in quality of life.  PHQ-9: Review Flowsheet  More data exists      03/25/2023 09/03/2017 04/21/2017 09/30/2016 09/11/2016  Depression screen PHQ 2/9  Decreased Interest 0 0 0 0 0  Down, Depressed, Hopeless 0 0 - 0 0  PHQ - 2 Score 0 0 0 0 0  Altered sleeping 0 - - - -  Tired, decreased energy 0 - - - -  Change in appetite 0 - - - -  Feeling bad or failure about yourself  0 - - - -  Trouble concentrating 0 - - - -  Moving slowly or fidgety/restless 0 - - - -  Suicidal thoughts 0 - - - -  PHQ-9 Score 0 - - - -   Interpretation of Total Score  Total Score Depression Severity:  1-4 = Minimal depression, 5-9 = Mild depression, 10-14 = Moderate depression, 15-19 = Moderately severe depression, 20-27 = Severe depression  Psychosocial Evaluation and Intervention:   Psychosocial Re-Evaluation:  Psychosocial Re-Evaluation     Row Name 03/25/23 1005 04/13/23 0828 05/04/23 0811         Psychosocial Re-Evaluation   Current issues with None Identified Current Sleep Concerns None Identified     Comments 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 His PHQ9 score was 0. He denies any depression, anxiety or stressors. 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 He hopes to return to work soon. He quit smoking during his hospitalization for surgery and is using nicotine  patches some now but not as much as when he started. He says he does not want to smoke now. He lives with his wife who is him main support person along with his daughter and son. He did the program 14 years ago after 2 stents and seems to be very motivated to participate again. His goals for the program are to continue to abstain from smoking, learn what he is able to do; and get his strength and stamina back. He has no barriers identified to complete the program. Pt states overall he is sleeping well. He has some nights that he doesn't sleep good because he just isn't tired, and other nights he sleeps well. He says this has been going on for a while now though. He states he does not have any current stress concerns. Pt states overall he is sleeping well and doesn't have any stressors that are bothering him. The sleep issues that were bothering him are not resolved.     Expected Outcomes Short Term: start the program and attend consistently. Long Term: Complete the program and meet his personal goals. Short term: continue the program consistently. Long term: Complete the program and meet personal goals. Short term: continue the program consistently. Long term: Complete the program and meet personal goals.     Interventions Stress management education;Relaxation education;Encouraged to attend Cardiac Rehabilitation for the exercise Encouraged to attend Cardiac Rehabilitation for the exercise Encouraged to attend Cardiac Rehabilitation for the exercise     Continue Psychosocial Services  No Follow up required Follow up required by staff Follow up required by staff              Psychosocial Discharge (Final Psychosocial Re-Evaluation):  Psychosocial Re-Evaluation - 05/04/23 0811       Psychosocial Re-Evaluation   Current issues with None Identified     Comments Pt states overall he is sleeping well and doesn't have any stressors that are bothering him. The sleep issues that were bothering him are not resolved.    Expected Outcomes Short term: continue the program consistently. Long term: Complete the program and meet personal goals.    Interventions Encouraged to attend Cardiac Rehabilitation for the exercise    Continue Psychosocial Services  Follow up required by staff             Vocational Rehabilitation: Provide vocational rehab assistance to qualifying candidates.   Vocational Rehab Evaluation & Intervention:  Vocational Rehab - 03/25/23 0901       Initial Vocational Rehab Evaluation & Intervention   Assessment shows need for Vocational Rehabilitation No      Vocational Rehab Re-Evaulation   Comments Patient is self-employed and is returning to work soon.             Education: Education Goals: Education classes will be provided on a weekly basis, covering required topics. Participant will state understanding/return demonstration of topics presented.  Learning Barriers/Preferences:  Learning Barriers/Preferences - 03/25/23 0827       Learning Barriers/Preferences   Learning Barriers None    Learning Preferences Skilled Demonstration;Audio;Written Material             Education Topics: Hypertension, Hypertension Reduction -Define heart disease and high blood pressure. Discus how high blood pressure affects the body and ways to reduce high blood pressure. Flowsheet Row CARDIAC REHAB PHASE II EXERCISE from 04/29/2023 in [place] C[facility]AB[facility]IT[facility]  Date 04/15/23  Educator [facility]  Instruction Review Code 1- Verbalizes Understanding       Exercise and Your Heart -Discuss why it is important to exercise, the FITT principles of exercise, normal and abnormal responses to exercise, and how to exercise safely.   Angina -Discuss definition of angina, causes of angina, treatment of angina, and how to  decrease risk of having angina.   Cardiac Medications -Review what the following cardiac medications are used for, how they affect the body, and side effects that may occur when taking the medications.  Medications include Aspirin , Beta blockers, calcium  channel blockers, ACE Inhibitors, angiotensin receptor blockers, diuretics, digoxin , and antihyperlipidemics.   Congestive Heart Failure -Discuss the definition of CHF, how to live with CHF, the signs and symptoms of CHF, and how keep track of weight and sodium intake. Flowsheet Row CARDIAC REHAB PHASE II EXERCISE from 04/29/2023 in [place] CARDIAC RE[facility]ABILITATION  Date 04/15/23  Educator [facility]  Instruction Review Code 1- Verbalizes Understanding       Heart Disease and Intimacy -Discus the effect sexual activity has on the heart, how changes occur during intimacy as we age, and safety during sexual activity.   Smoking Cessation / COPD -Discuss different methods to quit smoking, the health benefits of quitting smoking, and the definition of COPD.   Nutrition I: Fats -Discuss the types of cholesterol, what cholesterol does to the heart, and how cholesterol levels can be controlled.   Nutrition II: Labels -Discuss the different components of food labels and how to read food label   Heart Parts/Heart Disease and PAD -Discuss the anatomy of the heart, the pathway of blood circulation through the heart, and these are affected by heart disease.   Stress I: Signs and Symptoms -Discuss the causes of stress, how stress may lead to anxiety and depression, and ways to limit stress. Flowsheet Row CARDIAC REHAB PHASE II EXERCISE from 04/29/2023 in [place] CARDIAC REHABILITATION  Date 04/08/23  Educator [facility]  Instruction Review Code 1- Verbalizes Understanding       Stress II: Relaxation -Discuss different types of relaxation techniques to limit stress.   Warning Signs of Stroke / TIA -Discuss definition of a stroke, what the signs  and symptoms are of a stroke, and how to identify when someone is having stroke.   Knowledge Questionnaire Score:  Knowledge Questionnaire Score - 03/25/23 0947       Knowledge Questionnaire Score   Post Score [id number] (P)              Core Components/Risk Factors/Patient Goals at Admission:  Personal Goals and Risk Factors at Admission - 03/25/23 0901       Core Components/Risk Factors/Patient Goals on Admission    Weight Management Weight Maintenance;Yes    Intervention Weight Management: Develop a combined nutrition and exercise program designed to reach desired caloric intake, while maintaining appropriate intake of nutrient and fiber, sodium and fats, and appropriate energy expenditure required for the weight goal.;Weight Management: Provide education and appropriate resources  to help participant work on and attain dietary goals.    Admit Weight 165 lb 11.2 oz (75.2 kg)    Goal Weight: Short Term 165 lb (74.8 kg)    Goal Weight: Long Term 165 lb (74.8 kg)    Expected Outcomes Short Term: Continue to assess and modify interventions until short term weight is achieved;Long Term: Adherence to nutrition and physical activity/exercise program aimed toward attainment of established weight goal;Weight Maintenance: Understanding of the daily nutrition guidelines, which includes 25-35% calories from fat, 7% or less cal from saturated fats, less than 200mg  cholesterol, less than 1.5gm of sodium, & 5 or more servings of fruits and vegetables daily    Diabetes Yes    Intervention Provide education about signs/symptoms and action to take for hypo/hyperglycemia.;Provide education about proper nutrition, including hydration, and aerobic/resistive exercise prescription along with prescribed medications to achieve blood glucose in normal ranges: Fasting glucose 65-99 mg/dL    Expected Outcomes Short Term: Participant verbalizes understanding of the signs/symptoms and immediate care of  hyper/hypoglycemia, proper foot care and importance of medication, aerobic/resistive exercise and nutrition plan for blood glucose control.;Long Term: Attainment of HbA1C < 7%.    Hypertension Yes    Intervention Provide education on lifestyle modifcations including regular physical activity/exercise, weight management, moderate sodium restriction and increased consumption of fresh fruit, vegetables, and low fat dairy, alcohol moderation, and smoking cessation.;Monitor prescription use compliance.    Expected Outcomes Short Term: Continued assessment and intervention until BP is < 140/65mm HG in hypertensive participants. < 130/16mm HG in hypertensive participants with diabetes, heart failure or chronic kidney disease.;Long Term: Maintenance of blood pressure at goal levels.    Lipids Yes    Intervention Provide education and support for participant on nutrition & aerobic/resistive exercise along with prescribed medications to achieve LDL 70mg , HDL >40mg .    Expected Outcomes Short Term: Participant states understanding of desired cholesterol values and is compliant with medications prescribed. Participant is following exercise prescription and nutrition guidelines.;Long Term: Cholesterol controlled with medications as prescribed, with individualized exercise RX and with personalized nutrition plan. Value goals: LDL < 70mg , HDL > 40 mg.             Core Components/Risk Factors/Patient Goals Review:   Goals and Risk Factor Review     Row Name 04/13/23 0835 05/04/23 0808           Core Components/Risk Factors/Patient Goals Review   Personal Goals Review Hypertension;Diabetes;Lipids Hypertension;Diabetes;Lipids      Review Patient states that he takes his medications like he is supposed to. The first time he came to visit us , he was unable to participate in exercise because of his blood sugar being to high. The reason was he forgot to take his long acting insulin  the night before. Nicholas [name] states he  is more compliant with his medications and his blood sugars have been a lot better since he has came to see us  since. Patient states that he takes his medications like he is supposed to. The first time he came to visit us , he was unable to participate in exercise because of his blood sugar being to high. The reason was he forgot to take his long acting insulin  the night before. Nicholas [name] states he is more compliant with his medications and his blood sugars have been a lot better since he has came to see us  since. He states his blood sugars are still half and half with being under control and high.  Expected Outcomes Short: Continue taking medications as prescribed. Long: Continue medications and get blood sugar better under control with diet and exercise. Short: Continue taking medications as prescribed. Long: Continue medications and get blood sugar better under control with diet and exercise.               Core Components/Risk Factors/Patient Goals at Discharge (Final Review):   Goals and Risk Factor Review - 05/04/23 0808       Core Components/Risk Factors/Patient Goals Review   Personal Goals Review Hypertension;Diabetes;Lipids    Review Patient states that he takes his medications like he is supposed to. The first time he came to visit us , he was unable to participate in exercise because of his blood sugar being to high. The reason was he forgot to take his long acting insulin  the night before. Nicholas [name] states he is more compliant with his medications and his blood sugars have been a lot better since he has came to see us  since. He states his blood sugars are still half and half with being under control and high.    Expected Outcomes Short: Continue taking medications as prescribed. Long: Continue medications and get blood sugar better under control with diet and exercise.             ITP Comments:  ITP Comments     Row Name 04/06/23 0754 04/22/23 0803 05/20/23 0822 06/17/23 0800     ITP  Comments First full day of exercise!  Patient was oriented to gym and equipment including functions, settings, policies, and procedures.  Patient's individual exercise prescription and treatment plan were reviewed.  All starting workloads were established based on the results of the 6 minute walk test done at initial orientation visit.  The plan for exercise progression was also introduced and progression will be customized based on patient's performance and goals. 30 day review completed. ITP sent to Dr. Armida Lander, Medical Director of Cardiac Rehab. Continue with ITP unless changes are made by physician. 30 day review completed. ITP sent to Dr. Armida Lander, Medical Director of Cardiac Rehab. Continue with ITP unless changes are made by physician. Asa Lauth has been out since 05/11/23. 30 day review completed. ITP sent to Dr. Armida Lander, Medical Director of Cardiac Rehab. Continue with ITP unless changes are made by physician. Asa Lauth has been out since 05/11/23. When called, said he has been out of town with work.             Comments: 30 day review

## 2023-06-22 ENCOUNTER — Encounter (HOSPITAL_COMMUNITY): Payer: BC Managed Care – PPO

## 2023-06-24 ENCOUNTER — Encounter (HOSPITAL_COMMUNITY): Payer: BC Managed Care – PPO

## 2023-06-24 ENCOUNTER — Telehealth (HOSPITAL_COMMUNITY): Payer: Self-pay

## 2023-06-26 ENCOUNTER — Ambulatory Visit (HOSPITAL_COMMUNITY)
Admission: RE | Admit: 2023-06-26 | Discharge: 2023-06-26 | Disposition: A | Discharge status: Home / Self Care | Source: Ambulatory Visit | Attending: Cardiology | Admitting: Cardiology

## 2023-06-26 ENCOUNTER — Ambulatory Visit: Payer: Self-pay | Admitting: Cardiology

## 2023-06-26 DIAGNOSIS — I25119 Atherosclerotic heart disease of native coronary artery with unspecified angina pectoris: Secondary | ICD-10-CM | POA: Insufficient documentation

## 2023-06-26 LAB — ECHOCARDIOGRAM COMPLETE
Area-P 1/2: 2.87 cm2
S' Lateral: 3.7 cm

## 2023-06-26 NOTE — Progress Notes (Signed)
*  PRELIMINARY RESULTS* Echocardiogram 2D Echocardiogram has been performed.  Nicholas [name] 06/26/2023, 9:06 AM

## 2023-06-29 ENCOUNTER — Encounter (HOSPITAL_COMMUNITY): Payer: BC Managed Care – PPO

## 2023-07-01 ENCOUNTER — Encounter (HOSPITAL_COMMUNITY): Payer: BC Managed Care – PPO

## 2023-07-06 ENCOUNTER — Encounter (HOSPITAL_COMMUNITY): Payer: BC Managed Care – PPO

## 2023-07-07 ENCOUNTER — Ambulatory Visit (INDEPENDENT_AMBULATORY_CARE_PROVIDER_SITE_OTHER): Admitting: "Endocrinology

## 2023-07-07 ENCOUNTER — Encounter: Payer: Self-pay | Admitting: "Endocrinology

## 2023-07-07 VITALS — BP 114/68 | HR 80 | Ht 69.0 in | Wt 178.2 lb

## 2023-07-07 DIAGNOSIS — I10 Essential (primary) hypertension: Secondary | ICD-10-CM | POA: Diagnosis not present

## 2023-07-07 DIAGNOSIS — E1159 Type 2 diabetes mellitus with other circulatory complications: Secondary | ICD-10-CM | POA: Diagnosis not present

## 2023-07-07 DIAGNOSIS — Z794 Long term (current) use of insulin: Secondary | ICD-10-CM | POA: Diagnosis not present

## 2023-07-07 DIAGNOSIS — E782 Mixed hyperlipidemia: Secondary | ICD-10-CM | POA: Diagnosis not present

## 2023-07-07 LAB — POCT GLYCOSYLATED HEMOGLOBIN (HGB A1C): HbA1c, POC (controlled diabetic range): 13.9 % — AB (ref 0.0–7.0)

## 2023-07-07 MED ORDER — INSULIN LISPRO PROT & LISPRO (75-25 MIX) 100 UNIT/ML KWIKPEN: 40.0000 [IU] | PEN_INJECTOR | Freq: Two times a day (BID) | SUBCUTANEOUS | 1 refills | Status: DC

## 2023-07-07 MED ORDER — RYBELSUS 3 MG PO TABS: 3.0000 mg | ORAL_TABLET | Freq: Every day | ORAL | 1 refills | Status: DC

## 2023-07-07 NOTE — Progress Notes (Signed)
 07/07/2023  Endocrinology follow-up note  Subjective:    Patient ID: Nicholas [name], male    DOB: [birth year],    Past Medical History:  Diagnosis Date   Anal lesion    Anal papilla/tags - external lesion   Bilateral carpal tunnel syndrome 11/24/2018   Colon polyp    Hyperplastic rectosigmoid polyp 9/08   Coronary atherosclerosis of native coronary artery    DES to PLB, BMS to mid and distal RCA, DES to circumflex and distal LAD, 6/10 (5 stents)   Diabetic peripheral neuropathy (HCC) 11/24/2018   GERD (gastroesophageal reflux disease)    Headache    Migraines   Helicobacter pylori (H. pylori) 09/2009   Treated with helidac   Hiatal hernia    Pt denies   Hyperplastic colon polyp 2008   Mixed hyperlipidemia    Myocardial infarction (HCC) 06/2008   Seizures (HCC)    Related to stroke in 2018. None since then as of 01/2023   Stroke [facility])    January 2018   Type 2 diabetes mellitus [facility])    Past Surgical History:  Procedure Laterality Date   CHOLECYSTECTOMY     COLLAPSED LUNG     15 years ago   COLONOSCOPY  10/12/2006   Dr. Riley Cheadle- rectosigmoid polyp s/phot snare polypectomy o/w normal rectum and terminal ileum. hyperplastic polyp on bx   COLONOSCOPY WITH PROPOFOL  N/A 09/01/2019   Procedure: COLONOSCOPY WITH PROPOFOL ;  Surgeon: Suzette Espy, MD;  Location: [facility] ENDO SUITE;  Service: Endoscopy;  Laterality: N/A;  8:30am   CORONARY ARTERY BYPASS GRAFT N/A 02/09/2023   Procedure: CORONARY ARTERY BYPASS GRAFTING X 5, USING LEFT INTERNAL MAMMARY ARTERY AND ENDOSCOPICALLY HARVESTED LEFT SAPHENOUS VEIN GRAFT;  Surgeon: Bartley Lightning, MD;  Location: [facility] OR;  Service: Open Heart Surgery;  Laterality: N/A;   EP IMPLANTABLE DEVICE N/A 02/12/2016   Procedure: Loop Recorder Insertion;  Surgeon: Jolly Needle, MD;  Location: [facility] INVASIVE CV LAB;  Service: Cardiovascular;  Laterality: N/A;   ESOPHAGOGASTRODUODENOSCOPY  10/12/2006   Dr. Riley Cheadle- examination of the tubular esophagus revealed no mucosal  abnormalities. the EG junction was easily traversed. small hiatal hernia, the gastric mucosa o/w appeared normal. there was no infiltrating process or frank ulcer seen.   implantable loop recorder removal  09/29/2019   MDT Reveal LINQ removed in office by Dr Nunzio Belch   LEFT HEART CATH AND CORONARY ANGIOGRAPHY N/A 01/13/2023   Procedure: LEFT HEART CATH AND CORONARY ANGIOGRAPHY;  Surgeon: Arnoldo Lapping, MD;  Location: [facility] INVASIVE CV LAB;  Service: Cardiovascular;  Laterality: N/A;   TEE WITHOUT CARDIOVERSION N/A 02/12/2016   Procedure: TRANSESOPHAGEAL ECHOCARDIOGRAM (TEE);  Surgeon: Hugh Madura, MD;  Location: [facility] ENDOSCOPY;  Service: Cardiovascular;  Laterality: N/A;   TEE WITHOUT CARDIOVERSION N/A 02/09/2023   Procedure: TRANSESOPHAGEAL ECHOCARDIOGRAM (TEE);  Surgeon: Bartley Lightning, MD;  Location: [facility] OR;  Service: Open Heart Surgery;  Laterality: N/A;   WIRE IN APEX OF RIGHT LUNG     Since childhood   Social History   Socioeconomic History   Marital status: Married    Spouse name: Not on file   Number of children: Not on file   Years of education: Not on file   Highest education level: Not on file  Occupational History   Occupation: maintenance, parking lots    Employer: SELF-EMPLOYED  Tobacco Use   Smoking status: Former    Current packs/day: 0.50    Average packs/day: 0.5 packs/day for 50.2 years (25.1 ttl pk-yrs)  Types: Cigarettes    Start date: 04/08/1973   Smokeless tobacco: Never  Vaping Use   Vaping status: Never Used  Substance and Sexual Activity   Alcohol use: No    Alcohol/week: 0.0 standard drinks of alcohol   Drug use: No   Sexual activity: Yes    Partners: Female    Birth control/protection: Pill  Other Topics Concern   Not on file  Social History Narrative   Drinks Coffee 2 cups daily.  Lives sat home with wife.   Is self employed.  Graduated from High school.     Social Drivers of Corporate investment banker Strain: Not on file  Food Insecurity: No  Food Insecurity (02/09/2023)   Hunger Vital Sign    Worried About Running Out of Food in the Last Year: Never true    Ran Out of Food in the Last Year: Never true  Transportation Needs: No Transportation Needs (02/09/2023)   PRAPARE - Administrator, Civil Service (Medical): No    Lack of Transportation (Non-Medical): No  Physical Activity: Not on file  Stress: Not on file  Social Connections: Not on file   Outpatient Encounter Medications as of 07/07/2023  Medication Sig   acetaminophen  (TYLENOL ) 500 MG tablet Take 500 mg by mouth every 6 (six) hours as needed (for headaches).   albuterol  (VENTOLIN  HFA) 108 (90 Base) MCG/ACT inhaler Inhale 1-2 puffs into the lungs every 6 (six) hours as needed for wheezing or shortness of breath.   aspirin  EC 325 MG tablet Take 1 tablet (325 mg total) by mouth daily.   BD PEN NEEDLE NANO U/F 32G X 4 MM MISC USE AS DIRECTED FOUR TIMES DAILY.   Coenzyme Q10 (COQ10) 100 MG CAPS Take 100 mg by mouth daily.   Continuous Glucose Receiver (FREESTYLE LIBRE 3 READER) DEVI 1 Piece by Does not apply route once as needed for up to 1 dose.   Continuous Glucose Sensor (FREESTYLE LIBRE 3 PLUS SENSOR) MISC Use to monitor glucose continuously as directed. Change sensor every 15 days.   gabapentin  (NEURONTIN ) 100 MG capsule Take 100 mg by mouth daily.    Insulin  Lispro Prot & Lispro (HUMALOG  MIX 75/25 KWIKPEN) (75-25) 100 UNIT/ML Kwikpen Inject 40 Units into the skin 2 (two) times daily with a meal.   metoprolol  tartrate (LOPRESSOR ) 50 MG tablet Take 1 tablet (50 mg total) by mouth 2 (two) times daily.   nicotine  (NICODERM CQ  - DOSED IN MG/24 HR) 7 mg/24hr patch Place 1 patch (7 mg total) onto the skin daily.   nitroGLYCERIN  (NITROSTAT ) 0.4 MG SL tablet DISSOLVE ONE TABLET UNDER THE TONGUE EVERY 5 MINUTES AS NEEDED FOR CHEST PAIN.  DO NOT EXCEED A TOTAL OF 3 DOSES IN 15 MINUTES   nortriptyline  (PAMELOR ) 10 MG capsule Take 4 capsules (40 mg total) by mouth at  bedtime.   pantoprazole  (PROTONIX ) 40 MG tablet Take 40 mg by mouth daily before breakfast.   rosuvastatin  (CRESTOR ) 40 MG tablet Take 40 mg by mouth at bedtime.   Semaglutide  (RYBELSUS ) 3 MG TABS Take 1 tablet (3 mg total) by mouth daily before breakfast.   Ubrogepant  (UBRELVY ) 100 MG TABS Take 1 tablet (100 mg total) by mouth daily as needed. Take one tablet at onset of headache, may repeat 1 tablet in 2 hours, no more than 2 tablets in 24 hours   [DISCONTINUED] Insulin  Lispro Prot & Lispro (HUMALOG  MIX 75/25 KWIKPEN) (75-25) 100 UNIT/ML Kwikpen Inject 60 Units into the  skin 2 (two) times daily with a meal.   [DISCONTINUED] Semaglutide  (RYBELSUS ) 3 MG TABS Take 1 tablet (3 mg total) by mouth daily before breakfast. (Patient not taking: Reported on 07/07/2023)   No facility-administered encounter medications on file as of 07/07/2023.   ALLERGIES: Allergies  Allergen Reactions   Keppra  [Levetiracetam ]     Hallucination   Topamax  [Topiramate ]     irritable   VACCINATION STATUS:  There is no immunization history on file for this patient.  Diabetes He presents for his follow-up diabetic visit. He has type 2 diabetes mellitus. Onset time: He was diagnosed at approximate age of 49 years. In this particular patient with a history of heavy alcohol use possiblity of pancreatic diabetes is high. His disease course has been worsening. Pertinent negatives for hypoglycemia include no confusion, headaches, pallor, seizures or speech difficulty. Pertinent negatives for diabetes include no chest pain, no fatigue, no polydipsia, no polyphagia, no polyuria and no weakness. There are no hypoglycemic complications. Symptoms are worsening. Diabetic complications include a CVA and heart disease. (Patient is alarmingly noncompliant did not follow insulin  med regimen recommended, did not utilize his CGM glucose readings.) Risk factors for coronary artery disease include diabetes mellitus, dyslipidemia, hypertension,  male sex, sedentary lifestyle and tobacco exposure. Current diabetic treatment includes oral agent (dual therapy) and insulin  injections. He is compliant with treatment some of the time. His weight is fluctuating minimally. He is following a generally unhealthy diet. When asked about meal planning, he reported none. He has not had a previous visit with a dietitian (he did not keep appointment.). He rarely participates in exercise. His home blood glucose trend is increasing rapidly. His breakfast blood glucose range is generally >200 mg/dl. His lunch blood glucose range is generally >200 mg/dl. His dinner blood glucose range is generally >200 mg/dl. His bedtime blood glucose range is generally >200 mg/dl. His overall blood glucose range is >200 mg/dl. (He presents with his CGM device is significant under utilization reviewing 0-1 time daily on average.  He is AGP report shows average blood glucose of 349 mg per DL.  He has 2% time in range, 10% level 1 hyperglycemia, 88% level 2 hyperglycemia has no hypoglycemia.  Admittedly, he has not been using his insulin  consistently . There are days that he has not used any insulin  at all.  ) Eye exam is current.  Hyperlipidemia This is a chronic problem. The current episode started more than 1 year ago. The problem is uncontrolled (He was convinced to take Crestor  during his last visit, returns with significant improvement in his LDL from 135 down to 87, triglycerides from 396-215.). Exacerbating diseases include diabetes. Pertinent negatives include no chest pain, myalgias or shortness of breath. Current antihyperlipidemic treatment includes statins. Risk factors for coronary artery disease include dyslipidemia, diabetes mellitus, hypertension and a sedentary lifestyle.  Hypertension This is a chronic problem. The current episode started more than 1 year ago. The problem is controlled. Pertinent negatives include no chest pain, headaches, neck pain, palpitations or  shortness of breath. Risk factors for coronary artery disease include diabetes mellitus, dyslipidemia and smoking/tobacco exposure. Past treatments include beta blockers. Hypertensive end-organ damage includes CAD/MI and CVA.     Objective:    BP 114/68   Pulse 80   Ht 5\' 9"  (1.753 m)   Wt 178 lb 3.2 oz (80.8 kg)   BMI 26.32 kg/m   Wt Readings from Last 3 Encounters:  07/07/23 178 lb 3.2 oz (80.8 kg)  06/03/23  175 lb (79.4 kg)  03/25/23 165 lb 11.2 oz (75.2 kg)       Diabetic Labs (most recent): Lab Results  Component Value Date   HGBA1C 13.9 (A) 07/07/2023   HGBA1C 11.0 (H) 02/09/2023   HGBA1C 9.6 (A) 10/24/2022   Lipid Panel     Component Value Date/Time   CHOL 98 06/08/2023 0904   CHOL 167 01/07/2023 0803   TRIG 248 (H) 06/08/2023 0904   HDL 29 (L) 06/08/2023 0904   HDL 31 (L) 01/07/2023 0803   CHOLHDL 3.4 06/08/2023 0904   VLDL 50 (H) 06/08/2023 0904   LDLCALC 19 06/08/2023 0904   LDLCALC 92 01/07/2023 0803   LDLCALC 139 (H) 08/30/2020 0741      Latest Ref Rng & Units 02/11/2023    3:51 AM 02/10/2023    4:01 PM 02/10/2023    3:53 AM  CMP  Glucose 70 - 99 mg/dL 409  811  914   BUN 8 - 23 mg/dL 14  14  9    Creatinine 0.61 - 1.24 mg/dL 7.82  9.56  2.13   Sodium 135 - 145 mmol/L 134  134  136   Potassium 3.5 - 5.1 mmol/L 4.1  4.5  4.5   Chloride 98 - 111 mmol/L 100  100  107   CO2 22 - 32 mmol/L 25  24  24    Calcium  8.9 - 10.3 mg/dL 8.2  8.2  8.1      Assessment & Plan:   1. Type 2 diabetes mellitus with vascular disease (HCC) -His diabetes care is  complicated by history of alarming noncompliance/nonadherence, coronary artery disease and patient remains at a high risk for more acute and chronic complications of diabetes which include CAD, CVA, CKD, retinopathy, and neuropathy. These are all discussed in detail with the patient.  He presents with his CGM device is significant under utilization reviewing 0-1 time daily on average.  He is AGP report shows  average blood glucose of 349 mg per DL.  He has 2% time in range, 10% level 1 hyperglycemia, 88% level 2 hyperglycemia has no hypoglycemia.  Admittedly, he has not been using his insulin  consistently . There are days that he has not used any insulin  at all.   - Suggestion is made for him to avoid simple carbohydrates  from his diet including Cakes, Sweet Desserts, Ice Cream, Soda (diet and regular), Sweet Tea, Candies, Chips, Cookies, Store Bought Juices, Alcohol in Excess of  1-2 drinks a day, Artificial Sweeteners,  Coffee Creamer, and "Sugar-free" Products, Lemonade. This will help patient to have more stable blood glucose profile and potentially avoid unintended weight gain.  - Patient is advised to stick to a routine mealtimes to eat 3 meals  a day and avoid unnecessary snacks ( to snack only to correct hypoglycemia).  - I have approached patient with the following individualized plan to manage diabetes and patient agrees.  Unfortunately, Mr. Espin underwent CABG x 5 in January 2025 in the interval.  Disengaged from self-care once again.  He did not comply with basal/bolus insulin  previously tried.  He has not been well on MDI, he was switched to premixed insulin  for simplicity reasons.  He was not even committed to use the premixed insulin  twice daily either.    He promises to do better and remain engaged with premixed insulin .  He is advised to resume and continue Humalog  75/25 at 40 units with breakfast and 40 units with supper  when Premeal blood glucose  readings are above 90 mg per DL.    -He is advised to continue monitoring blood glucose continuously using his CGM.  -He is warned not to inject insulin  without proper monitoring of blood glucose. -He did not start the Rybelsus  prescribed for him during his last visit.  He may benefit from this intervention, advised to resume Rybelsus  3 mg p.o. daily at breakfast.    Side effects and precautions discussed with him.   -Target numbers  for A1c, LDL, HDL, Triglycerides,  were discussed in detail.   2) BP/HTN:  - Blood pressure is controlled to target.  He is advised to continue his current medications including beta blockers.  He has marginal blood pressure, he will not tolerate additional ACE inhibitors.  The patient was counseled on the dangers of tobacco use, and was advised to quit.  It appears that he has recently quit smoking.  Reviewed strategies to maximize success, including removing cigarettes and smoking materials from environment.  3) Lipids/HPL: His recent labs show uncontrolled LDL at 92.   He is advised to continue Crestor  20 mg p.o. nightly.   Side effects and precautions discussed with him.  4)  Weight/Diet: His BMI is 26.32-  He is not a candidate for weight loss.  He is  following with  CDE consult, exercise, and carbohydrates information provided.    5) Chronic Care/Health Maintenance:  -Patient is  Statin medications and encouraged to continue to follow up with Ophthalmology, Podiatrist at least yearly or according to recommendations, and advised to stay away from Smoking .  He previously quit smoking after a CVA, however resumed subsequently.  I have recommended yearly flu vaccine and pneumonia vaccination at least every 5 years; moderate intensity exercise for up to 150 minutes weekly; and  sleep for at least 7 hours a day.  The patient was counseled on the dangers of tobacco use, and was advised to quit.  Reviewed strategies to maximize success, including removing cigarettes and smoking materials from environment.   He recently had normal ABI, will be repeated in November 2026,  or sooner if needed. I advised patient to maintain close follow up with his PCP for primary care needs.   I spent  41  minutes in the care of the patient today including review of labs from CMP, Lipids, Thyroid  Function, Hematology (current and previous including abstractions from other facilities); face-to-face time  discussing  his blood glucose readings/logs, discussing hypoglycemia and hyperglycemia episodes and symptoms, medications doses, his options of short and long term treatment based on the latest standards of care / guidelines;  discussion about incorporating lifestyle medicine;  and documenting the encounter. Risk reduction counseling performed per USPSTF guidelines to reduce cardiovascular risk factors.     Please refer to Patient Instructions for Blood Glucose Monitoring and Insulin /Medications Dosing Guide"  in media tab for additional information. Please  also refer to " Patient Self Inventory" in the Media  tab for reviewed elements of pertinent patient history.  Nicholas [name] participated in the discussions, expressed understanding, and voiced agreement with the above plans.  All questions were answered to his satisfaction. he is encouraged to contact clinic should he have any questions or concerns prior to his return visit.    Follow up plan: Return in about 3 months (around 10/07/2023) for Bring Meter/CGM Device/Logs- A1c in Office.  Kalvin Orf, MD Phone: [phone]  Fax: [phone]  This note was partially dictated with voice recognition software. Similar sounding words can be transcribed  inadequately or may not  be corrected upon review.  07/07/2023, 8:45 AM

## 2023-07-07 NOTE — Patient Instructions (Signed)

## 2023-07-08 ENCOUNTER — Encounter (HOSPITAL_COMMUNITY): Payer: BC Managed Care – PPO

## 2023-07-13 ENCOUNTER — Encounter (HOSPITAL_COMMUNITY)
Admission: RE | Admit: 2023-07-13 | Discharge: 2023-07-13 | Disposition: A | Discharge status: Home / Self Care | Payer: BC Managed Care – PPO | Source: Ambulatory Visit | Attending: Cardiology | Admitting: Cardiology

## 2023-07-13 DIAGNOSIS — Z951 Presence of aortocoronary bypass graft: Secondary | ICD-10-CM | POA: Insufficient documentation

## 2023-07-15 ENCOUNTER — Encounter (HOSPITAL_COMMUNITY): Payer: BC Managed Care – PPO

## 2023-07-15 NOTE — Progress Notes (Signed)
 Cardiac Individual Treatment Plan  Patient Details  Name: Nicholas [name] MRN: [id number] Date of Birth: Feb 04, 1960 Referring Provider:   Flowsheet Row CARDIAC REHAB PHASE II ORIENTATION from 03/25/2023 in [facility] CARDIAC REHABILITATION  Referring Provider Linder Revere MD  [Primary Cardiologist: Dr. Hilario Lover McDowell]    Initial Encounter Date:  Flowsheet Row CARDIAC REHAB PHASE II ORIENTATION from 03/25/2023 in [place] CARDIAC REHABILITATION  Date 03/25/23    Visit Diagnosis: No diagnosis found.  Patient's Home Medications on Admission:  Current Outpatient Medications:    acetaminophen  (TYLENOL ) 500 MG tablet, Take 500 mg by mouth every 6 (six) hours as needed (for headaches)., Disp: , Rfl:    albuterol  (VENTOLIN  HFA) 108 (90 Base) MCG/ACT inhaler, Inhale 1-2 puffs into the lungs every 6 (six) hours as needed for wheezing or shortness of breath., Disp: , Rfl:    aspirin  EC 325 MG tablet, Take 1 tablet (325 mg total) by mouth daily., Disp: 100 tablet, Rfl: 4   BD PEN NEEDLE NANO U/F 32G X 4 MM MISC, USE AS DIRECTED FOUR TIMES DAILY., Disp: 100 each, Rfl: 2   Coenzyme Q10 (COQ10) 100 MG CAPS, Take 100 mg by mouth daily., Disp: , Rfl:    Continuous Glucose Receiver (FREESTYLE LIBRE 3 READER) DEVI, 1 Piece by Does not apply route once as needed for up to 1 dose., Disp: 1 each, Rfl: 0   Continuous Glucose Sensor (FREESTYLE LIBRE 3 PLUS SENSOR) MISC, Use to monitor glucose continuously as directed. Change sensor every 15 days., Disp: 6 each, Rfl: 0   gabapentin  (NEURONTIN ) 100 MG capsule, Take 100 mg by mouth daily. , Disp: , Rfl:    Insulin  Lispro Prot & Lispro (HUMALOG  MIX 75/25 KWIKPEN) (75-25) 100 UNIT/ML Kwikpen, Inject 40 Units into the skin 2 (two) times daily with a meal., Disp: 45 mL, Rfl: 1   metoprolol  tartrate (LOPRESSOR ) 50 MG tablet, Take 1 tablet (50 mg total) by mouth 2 (two) times daily., Disp: 180 tablet, Rfl: 3   nicotine  (NICODERM CQ  - DOSED IN MG/24 HR) 7 mg/24hr patch,  Place 1 patch (7 mg total) onto the skin daily., Disp: 28 patch, Rfl: 0   nitroGLYCERIN  (NITROSTAT ) 0.4 MG SL tablet, DISSOLVE ONE TABLET UNDER THE TONGUE EVERY 5 MINUTES AS NEEDED FOR CHEST PAIN.  DO NOT EXCEED A TOTAL OF 3 DOSES IN 15 MINUTES, Disp: 25 tablet, Rfl: 3   nortriptyline  (PAMELOR ) 10 MG capsule, Take 4 capsules (40 mg total) by mouth at bedtime., Disp: 120 capsule, Rfl: 5   pantoprazole  (PROTONIX ) 40 MG tablet, Take 40 mg by mouth daily before breakfast., Disp: , Rfl:    rosuvastatin  (CRESTOR ) 40 MG tablet, Take 40 mg by mouth at bedtime., Disp: , Rfl:    Semaglutide  (RYBELSUS ) 3 MG TABS, Take 1 tablet (3 mg total) by mouth daily before breakfast., Disp: 90 tablet, Rfl: 1   Ubrogepant  (UBRELVY ) 100 MG TABS, Take 1 tablet (100 mg total) by mouth daily as needed. Take one tablet at onset of headache, may repeat 1 tablet in 2 hours, no more than 2 tablets in 24 hours, Disp: 10 tablet, Rfl: 11  Past Medical History: Past Medical History:  Diagnosis Date   Anal lesion    Anal papilla/tags - external lesion   Bilateral carpal tunnel syndrome 11/24/2018   Colon polyp    Hyperplastic rectosigmoid polyp 9/08   Coronary atherosclerosis of native coronary artery    DES to PLB, BMS to mid and distal RCA, DES to  circumflex and distal LAD, 6/10 (5 stents)   Diabetic peripheral neuropathy (HCC) 11/24/2018   GERD (gastroesophageal reflux disease)    Headache    Migraines   Helicobacter pylori (H. pylori) 09/2009   Treated with helidac   Hiatal hernia    Pt denies   Hyperplastic colon polyp 2008   Mixed hyperlipidemia    Myocardial infarction (HCC) 06/2008   Seizures (HCC)    Related to stroke in 2018. None since then as of 01/2023   Stroke  [facility])    January 2018   Type 2 diabetes mellitus (HCC)     Tobacco Use: Social History   Tobacco Use  Smoking Status Former   Current packs/day: 0.50   Average packs/day: 0.5 packs/day for 50.3 years (25.1 ttl pk-yrs)   Types: Cigarettes    Start date: 04/08/1973  Smokeless Tobacco Never    Labs: Review Flowsheet  More data exists      Latest Ref Rng & Units 10/24/2022 01/07/2023 02/09/2023 06/08/2023 07/07/2023  Labs for ITP Cardiac and Pulmonary Rehab  Cholestrol 0 - 200 mg/dL - 409  - 98  -  LDL (calc) 0 - 99 mg/dL - 92  - 19  -  HDL-C >81 mg/dL - 31  - 29  -  Trlycerides <150 mg/dL - 191  - 478  -  Hemoglobin A1c 0.0 - 7.0 % 9.6  - 11.0  - 13.9   PH, Arterial 7.35 - 7.45 - - 7.329  7.349  7.332  7.362  - -  PCO2 arterial 32 - 48 mmHg - - 45.4  45.0  42.6  39.5  - -  Bicarbonate 20.0 - 28.0 mmol/L - - 24.0  24.8  22.6  23.9  22.4  - -  TCO2 22 - 32 mmol/L - - 25  26  24  22  24  25  25  24  22  24   - -  Acid-base deficit 0.0 - 2.0 mmol/L - - 2.0  1.0  3.0  2.0  3.0  - -  O2 Saturation % - - 99  94  99  70  100  - -    Details       Multiple values from one day are sorted in reverse-chronological order         Capillary Blood Glucose: Lab Results  Component Value Date   GLUCAP 143 (H) 04/13/2023   GLUCAP 122 (H) 04/13/2023   GLUCAP 182 (H) 04/08/2023   GLUCAP 126 (H) 04/08/2023   GLUCAP 208 (H) 04/06/2023     Exercise Target Goals: Exercise Program Goal: Individual exercise prescription set using results from initial 6 min walk test and THRR while considering  patient's activity barriers and safety.   Exercise Prescription Goal: Starting with aerobic activity 30 plus minutes a day, 3 days per week for initial exercise prescription. Provide home exercise prescription and guidelines that participant acknowledges understanding prior to discharge.  Activity Barriers & Risk Stratification:  Activity Barriers & Cardiac Risk Stratification - 03/25/23 0813       Activity Barriers & Cardiac Risk Stratification   Activity Barriers None    Cardiac Risk Stratification High          6 Minute Walk:  6 Minute Walk     Row Name 03/25/23 0930         6 Minute Walk   Phase Initial     Distance 1075 feet      Walk Time 6 minutes     #  of Rest Breaks 0     MPH 2.92     METS 2.04     RPE 10     VO2 Peak 10.22     Symptoms No     Resting HR 88 bpm     Resting BP 90/62     Resting Oxygen Saturation  95 %     Exercise Oxygen Saturation  during 6 min walk 95 %     Max Ex. HR 95 bpm     Max Ex. BP 120/70     2 Minute Post BP 114/60        Oxygen Initial Assessment:   Oxygen Re-Evaluation:   Oxygen Discharge (Final Oxygen Re-Evaluation):   Initial Exercise Prescription:  Initial Exercise Prescription - 03/25/23 0900       Date of Initial Exercise RX and Referring Provider   Date 03/25/23    Referring Provider Linder Revere MD   Primary Cardiologist: Dr. Teddie Favre     Oxygen   Maintain Oxygen Saturation 88% or higher      Treadmill   MPH 2    Grade 0.5    Minutes 15    METs 2.67      REL-XR   Level 3    Speed 50    Minutes 15    METs 2.5      Prescription Details   Frequency (times per week) 3    Duration Progress to 30 minutes of continuous aerobic without signs/symptoms of physical distress      Intensity   THRR 40-80% of Max Heartrate 116-143    Ratings of Perceived Exertion 11-13    Perceived Dyspnea 0-4      Progression   Progression Continue to progress workloads to maintain intensity without signs/symptoms of physical distress.      Resistance Training   Training Prescription Yes    Weight 4 lb    Reps 10-15          Perform Capillary Blood Glucose checks as needed.  Exercise Prescription Changes:   Exercise Prescription Changes     Row Name 03/25/23 0900 04/13/23 1500 04/29/23 1300         Response to Exercise   Blood Pressure (Admit) 90/62 110/60 128/74     Blood Pressure (Exercise) 120/70 106/62 132/70     Blood Pressure (Exit) 114/60 102/60 106/62     Heart Rate (Admit) 88 bpm 50 bpm 63 bpm     Heart Rate (Exercise) 95 bpm 120 bpm 122 bpm     Heart Rate (Exit) 93 bpm 111 bpm 120 bpm     Oxygen Saturation (Admit) 95 % -- --      Oxygen Saturation (Exercise) 95 % -- --     Rating of Perceived Exertion (Exercise) 10 13 13      Symptoms none -- --     Comments walk test results -- --     Duration -- Continue with 30 min of aerobic exercise without signs/symptoms of physical distress. Continue with 30 min of aerobic exercise without signs/symptoms of physical distress.     Intensity -- THRR unchanged THRR unchanged       Progression   Progression -- Continue to progress workloads to maintain intensity without signs/symptoms of physical distress. Continue to progress workloads to maintain intensity without signs/symptoms of physical distress.       Resistance Training   Training Prescription -- Yes Yes     Weight -- 4 4  Reps -- 10-15 10-15       Treadmill   MPH -- 1.7 2     Grade -- 1 1     Minutes -- 15 15     METs -- 2.54 2.81       REL-XR   Level -- 3 5     Speed -- 46 52     Minutes -- 15 15     METs -- 2.6 2.9        Exercise Comments:   Exercise Comments     Row Name 04/06/23 0754           Exercise Comments First full day of exercise!  Patient was oriented to gym and equipment including functions, settings, policies, and procedures.  Patient's individual exercise prescription and treatment plan were reviewed.  All starting workloads were established based on the results of the 6 minute walk test done at initial orientation visit.  The plan for exercise progression was also introduced and progression will be customized based on patient's performance and goals.          Exercise Goals and Review:   Exercise Goals     Row Name 03/25/23 0933             Exercise Goals   Increase Physical Activity Yes       Intervention Provide advice, education, support and counseling about physical activity/exercise needs.;Develop an individualized exercise prescription for aerobic and resistive training based on initial evaluation findings, risk stratification, comorbidities and participant's  personal goals.       Expected Outcomes Short Term: Attend rehab on a regular basis to increase amount of physical activity.;Long Term: Add in home exercise to make exercise part of routine and to increase amount of physical activity.;Long Term: Exercising regularly at least 3-5 days a week.       Increase Strength and Stamina Yes       Intervention Develop an individualized exercise prescription for aerobic and resistive training based on initial evaluation findings, risk stratification, comorbidities and participant's personal goals.;Provide advice, education, support and counseling about physical activity/exercise needs.       Expected Outcomes Short Term: Perform resistance training exercises routinely during rehab and add in resistance training at home;Short Term: Increase workloads from initial exercise prescription for resistance, speed, and METs.;Long Term: Improve cardiorespiratory fitness, muscular endurance and strength as measured by increased METs and functional capacity ( )       Able to understand and use rate of perceived exertion (RPE) scale Yes       Intervention Provide education and explanation on how to use RPE scale       Expected Outcomes Short Term: Able to use RPE daily in rehab to express subjective intensity level;Long Term:  Able to use RPE to guide intensity level when exercising independently       Able to understand and use Dyspnea scale Yes       Intervention Provide education and explanation on how to use Dyspnea scale       Expected Outcomes Short Term: Able to use Dyspnea scale daily in rehab to express subjective sense of shortness of breath during exertion;Long Term: Able to use Dyspnea scale to guide intensity level when exercising independently       Knowledge and understanding of Target Heart Rate Range (THRR) Yes       Intervention Provide education and explanation of THRR including how the numbers were predicted and where they are located for reference  Expected Outcomes Short Term: Able to state/look up THRR;Long Term: Able to use THRR to govern intensity when exercising independently;Short Term: Able to use daily as guideline for intensity in rehab       Able to check pulse independently Yes       Intervention Provide education and demonstration on how to check pulse in carotid and radial arteries.;Review the importance of being able to check your own pulse for safety during independent exercise       Expected Outcomes Short Term: Able to explain why pulse checking is important during independent exercise;Long Term: Able to check pulse independently and accurately       Understanding of Exercise Prescription Yes       Intervention Provide education, explanation, and written materials on patient's individual exercise prescription       Expected Outcomes Short Term: Able to explain program exercise prescription;Long Term: Able to explain home exercise prescription to exercise independently          Exercise Goals Re-Evaluation :  Exercise Goals Re-Evaluation     Row Name 04/06/23 0755 04/13/23 0839 05/04/23 0802         Exercise Goal Re-Evaluation   Exercise Goals Review Knowledge and understanding of Target Heart Rate Range (THRR);Able to understand and use rate of perceived exertion (RPE) scale Increase Strength and Stamina;Increase Physical Activity --     Comments Reviewed RPE and dyspnea scale, THR and program prescription with pt today.  Pt voiced understanding and was given a copy of goals to take home Patient states that outside of the program, he walks a little bit at home as he has to walk on job sites a lot. States he still doesn't have much energy, that he eats well and drinks water  all the time. Encouraged patient to talk to his Cardiologist about taking a multivitamin and getting some blood work drawn to check for possible low Vit. D and Vit. B12 levels or other levels that could contribute to low energy. Pt states he will bring  this up at his heart doctor appt which is April 14th. Joe states that most of his exercise comes from this program, he does do some walking outside of the program where he is busy on the job sites, but other than that this program is his main exercise. Eating and drinking well and still has his cardio follow up on the 14th.     Expected Outcomes Short: Use RPE daily to regulate intensity.  Long: Follow program prescription in THR. Short: Continue to attend program and exercise. Long: Exercise more at home and improve strength and stamina to regain energy levels. Short: Continue to attend program and exercise. Long: Exercise more at home and improve strength and stamina to regain energy levels.         Discharge Exercise Prescription (Final Exercise Prescription Changes):  Exercise Prescription Changes - 04/29/23 1300       Response to Exercise   Blood Pressure (Admit) 128/74    Blood Pressure (Exercise) 132/70    Blood Pressure (Exit) 106/62    Heart Rate (Admit) 63 bpm    Heart Rate (Exercise) 122 bpm    Heart Rate (Exit) 120 bpm    Rating of Perceived Exertion (Exercise) 13    Duration Continue with 30 min of aerobic exercise without signs/symptoms of physical distress.    Intensity THRR unchanged      Progression   Progression Continue to progress workloads to maintain intensity without signs/symptoms of physical  distress.      Resistance Training   Training Prescription Yes    Weight 4    Reps 10-15      Treadmill   MPH 2    Grade 1    Minutes 15    METs 2.81      REL-XR   Level 5    Speed 52    Minutes 15    METs 2.9          Nutrition:  Target Goals: Understanding of nutrition guidelines, daily intake of sodium 1500mg , cholesterol 200mg , calories 30% from fat and 7% or less from saturated fats, daily to have 5 or more servings of fruits and vegetables.  Biometrics:  Pre Biometrics - 03/25/23 0933       Pre Biometrics   Height 5' 8 (1.727 m)    Weight  75.2 kg    Waist Circumference 35.5 inches    Hip Circumference 35 inches    Waist to Hip Ratio 1.01 %    BMI (Calculated) 25.2    Grip Strength 30.7 kg    Single Leg Stand 20.3 seconds           Nutrition Therapy Plan and Nutrition Goals:  Nutrition Therapy & Goals - 03/25/23 0934       Intervention Plan   Intervention Prescribe, educate and counsel regarding individualized specific dietary modifications aiming towards targeted core components such as weight, hypertension, lipid management, diabetes, heart failure and other comorbidities.;Nutrition handout(s) given to patient.    Expected Outcomes Short Term Goal: Understand basic principles of dietary content, such as calories, fat, sodium, cholesterol and nutrients.;Long Term Goal: Adherence to prescribed nutrition plan.          Nutrition Assessments:  MEDIFICTS Score Key: >=70 Need to make dietary changes  40-70 Heart Healthy Diet <= 40 Therapeutic Level Cholesterol Diet   Picture Your Plate Scores: <16 Unhealthy dietary pattern with much room for improvement. 41-50 Dietary pattern unlikely to meet recommendations for good health and room for improvement. 51-60 More healthful dietary pattern, with some room for improvement.  >60 Healthy dietary pattern, although there may be some specific behaviors that could be improved.    Nutrition Goals Re-Evaluation:  Nutrition Goals Re-Evaluation     Row Name 04/13/23 1096 05/04/23 0810           Goals   Nutrition Goal Eat healthy consistently. Eat healthy consistently.      Comment Patient states he eats healthy most of the time. States he will have a cheat day every now and then, but not often. Joe states that he eats healthy majority of the time and most of his liquid consumption is water .      Expected Outcome Short term: Continue to eat healthy. Long term: Still eat healthy and have cheat days in moderation. Short term: Continue to eat healthy. Long term: Still eat  healthy and have cheat days in moderation.         Nutrition Goals Discharge (Final Nutrition Goals Re-Evaluation):  Nutrition Goals Re-Evaluation - 05/04/23 0810       Goals   Nutrition Goal Eat healthy consistently.    Comment Joe states that he eats healthy majority of the time and most of his liquid consumption is water .    Expected Outcome Short term: Continue to eat healthy. Long term: Still eat healthy and have cheat days in moderation.          Psychosocial: Target Goals: Acknowledge presence or absence  of significant depression and/or stress, maximize coping skills, provide positive support system. Participant is able to verbalize types and ability to use techniques and skills needed for reducing stress and depression.  Initial Review & Psychosocial Screening:  Initial Psych Review & Screening - 03/25/23 0901       Initial Review   Current issues with None Identified      Family Dynamics   Good Support System? Yes      Barriers   Psychosocial barriers to participate in program Psychosocial barriers identified (see note)      Screening Interventions   Interventions Encouraged to exercise;Program counselor consult;Provide feedback about the scores to participant;To provide support and resources with identified psychosocial needs    Expected Outcomes Short Term goal: Utilizing psychosocial counselor, staff and physician to assist with identification of specific Stressors or current issues interfering with healing process. Setting desired goal for each stressor or current issue identified.;Long Term Goal: Stressors or current issues are controlled or eliminated.;Short Term goal: Identification and review with participant of any Quality of Life or Depression concerns found by scoring the questionnaire.;Long Term goal: The participant improves quality of Life and PHQ9 Scores as seen by post scores and/or verbalization of changes          Quality of Life Scores:  Quality  of Life - 03/25/23 1338       Quality of Life   Select Quality of Life      Quality of Life Scores   Health/Function Pre 17.1 %    Socioeconomic Pre 17.5 %    Psych/Spiritual Pre 17.5 %    Family Pre 17.5 %    GLOBAL Pre 17.33 %         Scores of 19 and below usually indicate a poorer quality of life in these areas.  A difference of  2-3 points is a clinically meaningful difference.  A difference of 2-3 points in the total score of the Quality of Life Index has been associated with significant improvement in overall quality of life, self-image, physical symptoms, and general health in studies assessing change in quality of life.  PHQ-9: Review Flowsheet  More data exists      03/25/2023 09/03/2017 04/21/2017 09/30/2016 09/11/2016  Depression screen PHQ 2/9  Decreased Interest 0 0 0 0 0  Down, Depressed, Hopeless 0 0 - 0 0  PHQ - 2 Score 0 0 0 0 0  Altered sleeping 0 - - - -  Tired, decreased energy 0 - - - -  Change in appetite 0 - - - -  Feeling bad or failure about yourself  0 - - - -  Trouble concentrating 0 - - - -  Moving slowly or fidgety/restless 0 - - - -  Suicidal thoughts 0 - - - -  PHQ-9 Score 0 - - - -   Interpretation of Total Score  Total Score Depression Severity:  1-4 = Minimal depression, 5-9 = Mild depression, 10-14 = Moderate depression, 15-19 = Moderately severe depression, 20-27 = Severe depression   Psychosocial Evaluation and Intervention:   Psychosocial Re-Evaluation:  Psychosocial Re-Evaluation     Row Name 03/25/23 1005 04/13/23 0828 05/04/23 0811         Psychosocial Re-Evaluation   Current issues with None Identified Current Sleep Concerns None Identified     Comments 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 His PHQ9 score was 0. He denies any depression, anxiety or stressors. 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 He hopes to return to work soon. He quit smoking during his hospitalization for surgery and is using  nicotine  patches some now but not as much as when he started. He says he does not want to smoke now. He lives with his wife who is him main support person along with his daughter and son. He did the program 14 years ago after 2 stents and seems to be very motivated to participate again. His goals for the program are to continue to abstain from smoking, learn what he is able to do; and get his strength and stamina back. He has no barriers identified to complete the program. Pt states overall he is sleeping well. He has some nights that he doesn't sleep good because he just isn't tired, and other nights he sleeps well. He says this has been going on for a while now though. He states he does not have any current stress concerns. Pt states overall he is sleeping well and doesn't have any stressors that are bothering him. The sleep issues that were bothering him are not resolved.     Expected Outcomes Short Term: start the program and attend consistently. Long Term: Complete the program and meet his personal goals. Short term: continue the program consistently. Long term: Complete the program and meet personal goals. Short term: continue the program consistently. Long term: Complete the program and meet personal goals.     Interventions Stress management education;Relaxation education;Encouraged to attend Cardiac Rehabilitation for the exercise Encouraged to attend Cardiac Rehabilitation for the exercise Encouraged to attend Cardiac Rehabilitation for the exercise     Continue Psychosocial Services  No Follow up required Follow up required by staff Follow up required by staff        Psychosocial Discharge (Final Psychosocial Re-Evaluation):  Psychosocial Re-Evaluation - 05/04/23 0811       Psychosocial Re-Evaluation   Current issues with None Identified    Comments Pt states overall he is sleeping well and doesn't have any stressors that are bothering him. The sleep issues that were bothering him are not  resolved.    Expected Outcomes Short term: continue the program consistently. Long term: Complete the program and meet personal goals.    Interventions Encouraged to attend Cardiac Rehabilitation for the exercise    Continue Psychosocial Services  Follow up required by staff          Vocational Rehabilitation: Provide vocational rehab assistance to qualifying candidates.   Vocational Rehab Evaluation & Intervention:  Vocational Rehab - 03/25/23 0901       Initial Vocational Rehab Evaluation & Intervention   Assessment shows need for Vocational Rehabilitation No      Vocational Rehab Re-Evaulation   Comments Patient is self-employed and is returning to work soon.          Education: Education Goals: Education classes will be provided on a weekly basis, covering required topics. Participant will state understanding/return demonstration of topics presented.  Learning Barriers/Preferences:  Learning Barriers/Preferences - 03/25/23 0827       Learning Barriers/Preferences   Learning Barriers None    Learning Preferences Skilled Demonstration;Audio;Written Material          Education Topics: Hypertension, Hypertension Reduction -Define heart disease and high blood pressure. Discus how high blood pressure affects the body and ways to reduce high blood pressure. Flowsheet Row CARDIAC REHAB PHASE II EXERCISE from 04/29/2023 in [place] C[facility]AB[facility]  Date 04/15/23  Educator [facility]  Instruction Review  Code 1- Verbalizes Understanding    Exercise and Your Heart -Discuss why it is important to exercise, the FITT principles of exercise, normal and abnormal responses to exercise, and how to exercise safely.   Angina -Discuss definition of angina, causes of angina, treatment of angina, and how to decrease risk of having angina.   Cardiac Medications -Review what the following cardiac medications are used for, how they affect the body, and side effects that may occur  when taking the medications.  Medications include Aspirin , Beta blockers, calcium  channel blockers, ACE Inhibitors, angiotensin receptor blockers, diuretics, digoxin , and antihyperlipidemics.   Congestive Heart Failure -Discuss the definition of CHF, how to live with CHF, the signs and symptoms of CHF, and how keep track of weight and sodium intake. Flowsheet Row CARDIAC REHAB PHASE II EXERCISE from 04/29/2023 in [place] CARDIAC RE[facility]ABILITATION  Date 04/15/23  Educator [facility]  Instruction Review Code 1- Verbalizes Understanding    Heart Disease and Intimacy -Discus the effect sexual activity has on the heart, how changes occur during intimacy as we age, and safety during sexual activity.   Smoking Cessation / COPD -Discuss different methods to quit smoking, the health benefits of quitting smoking, and the definition of COPD.   Nutrition I: Fats -Discuss the types of cholesterol, what cholesterol does to the heart, and how cholesterol levels can be controlled.   Nutrition II: Labels -Discuss the different components of food labels and how to read food label   Heart Parts/Heart Disease and PAD -Discuss the anatomy of the heart, the pathway of blood circulation through the heart, and these are affected by heart disease.   Stress I: Signs and Symptoms -Discuss the causes of stress, how stress may lead to anxiety and depression, and ways to limit stress. Flowsheet Row CARDIAC REHAB PHASE II EXERCISE from 04/29/2023 in [place] C[facility]IAC REHAB[facility]  Date 04/08/23  Educator [facility]  Instruction Review Code 1- Verbalizes Understanding    Stress II: Relaxation -Discuss different types of relaxation techniques to limit stress.   Warning Signs of Stroke / TIA -Discuss definition of a stroke, what the signs and symptoms are of a stroke, and how to identify when someone is having stroke.   Knowledge Questionnaire Score:  Knowledge Questionnaire Score - 03/25/23 0947       Knowledge  Questionnaire Score   Post Score [id number] (P)           Core Components/Risk Factors/Patient Goals at Admission:  Personal Goals and Risk Factors at Admission - 03/25/23 0901       Core Components/Risk Factors/Patient Goals on Admission    Weight Management Weight Maintenance;Yes    Intervention Weight Management: Develop a combined nutrition and exercise program designed to reach desired caloric intake, while maintaining appropriate intake of nutrient and fiber, sodium and fats, and appropriate energy expenditure required for the weight goal.;Weight Management: Provide education and appropriate resources to help participant work on and attain dietary goals.    Admit Weight 165 lb 11.2 oz (75.2 kg)    Goal Weight: Short Term 165 lb (74.8 kg)    Goal Weight: Long Term 165 lb (74.8 kg)    Expected Outcomes Short Term: Continue to assess and modify interventions until short term weight is achieved;Long Term: Adherence to nutrition and physical activity/exercise program aimed toward attainment of established weight goal;Weight Maintenance: Understanding of the daily nutrition guidelines, which includes 25-35% calories from fat, 7% or less cal from saturated fats, less than 200mg  cholesterol, less than 1.5gm  of sodium, & 5 or more servings of fruits and vegetables daily    Diabetes Yes    Intervention Provide education about signs/symptoms and action to take for hypo/hyperglycemia.;Provide education about proper nutrition, including hydration, and aerobic/resistive exercise prescription along with prescribed medications to achieve blood glucose in normal ranges: Fasting glucose 65-99 mg/dL    Expected Outcomes Short Term: Participant verbalizes understanding of the signs/symptoms and immediate care of hyper/hypoglycemia, proper foot care and importance of medication, aerobic/resistive exercise and nutrition plan for blood glucose control.;Long Term: Attainment of HbA1C < 7%.    Hypertension Yes     Intervention Provide education on lifestyle modifcations including regular physical activity/exercise, weight management, moderate sodium restriction and increased consumption of fresh fruit, vegetables, and low fat dairy, alcohol moderation, and smoking cessation.;Monitor prescription use compliance.    Expected Outcomes Short Term: Continued assessment and intervention until BP is < 140/6mm HG in hypertensive participants. < 130/55mm HG in hypertensive participants with diabetes, heart failure or chronic kidney disease.;Long Term: Maintenance of blood pressure at goal levels.    Lipids Yes    Intervention Provide education and support for participant on nutrition & aerobic/resistive exercise along with prescribed medications to achieve LDL 70mg , HDL >40mg .    Expected Outcomes Short Term: Participant states understanding of desired cholesterol values and is compliant with medications prescribed. Participant is following exercise prescription and nutrition guidelines.;Long Term: Cholesterol controlled with medications as prescribed, with individualized exercise RX and with personalized nutrition plan. Value goals: LDL < 70mg , HDL > 40 mg.          Core Components/Risk Factors/Patient Goals Review:   Goals and Risk Factor Review     Row Name 04/13/23 0835 05/04/23 0808           Core Components/Risk Factors/Patient Goals Review   Personal Goals Review Hypertension;Diabetes;Lipids Hypertension;Diabetes;Lipids      Review Patient states that he takes his medications like he is supposed to. The first time he came to visit us , he was unable to participate in exercise because of his blood sugar being to high. The reason was he forgot to take his long acting insulin  the night before. Joe states he is more compliant with his medications and his blood sugars have been a lot better since he has came to see us  since. Patient states that he takes his medications like he is supposed to. The first time he  came to visit us , he was unable to participate in exercise because of his blood sugar being to high. The reason was he forgot to take his long acting insulin  the night before. Joe states he is more compliant with his medications and his blood sugars have been a lot better since he has came to see us  since. He states his blood sugars are still half and half with being under control and high.      Expected Outcomes Short: Continue taking medications as prescribed. Long: Continue medications and get blood sugar better under control with diet and exercise. Short: Continue taking medications as prescribed. Long: Continue medications and get blood sugar better under control with diet and exercise.         Core Components/Risk Factors/Patient Goals at Discharge (Final Review):   Goals and Risk Factor Review - 05/04/23 0808       Core Components/Risk Factors/Patient Goals Review   Personal Goals Review Hypertension;Diabetes;Lipids    Review Patient states that he takes his medications like he is supposed to. The first time he  came to visit us , he was unable to participate in exercise because of his blood sugar being to high. The reason was he forgot to take his long acting insulin  the night before. Joe states he is more compliant with his medications and his blood sugars have been a lot better since he has came to see us  since. He states his blood sugars are still half and half with being under control and high.    Expected Outcomes Short: Continue taking medications as prescribed. Long: Continue medications and get blood sugar better under control with diet and exercise.          ITP Comments:  ITP Comments     Row Name 04/06/23 0754 04/22/23 0803 05/20/23 0822 06/17/23 0800 07/15/23 0746   ITP Comments First full day of exercise!  Patient was oriented to gym and equipment including functions, settings, policies, and procedures.  Patient's individual exercise prescription and treatment plan were  reviewed.  All starting workloads were established based on the results of the 6 minute walk test done at initial orientation visit.  The plan for exercise progression was also introduced and progression will be customized based on patient's performance and goals. 30 day review completed. ITP sent to Dr. Armida Lander, Medical Director of Cardiac Rehab. Continue with ITP unless changes are made by physician. 30 day review completed. ITP sent to Dr. Armida Lander, Medical Director of Cardiac Rehab. Continue with ITP unless changes are made by physician. Asa Lauth has been out since 05/11/23. 30 day review completed. ITP sent to Dr. Armida Lander, Medical Director of Cardiac Rehab. Continue with ITP unless changes are made by physician. Asa Lauth has been out since 05/11/23. When called, said he has been out of town with work. 30 day review completed. ITP sent to Dr. Armida Lander, Medical Director of Cardiac Rehab. Continue with ITP unless changes are made by physician. Asa Lauth has been out since 05/11/23. When called, said he has been out of town with work.      Comments: 30 day review.

## 2023-07-20 ENCOUNTER — Encounter (HOSPITAL_COMMUNITY): Payer: BC Managed Care – PPO

## 2023-07-22 ENCOUNTER — Encounter (HOSPITAL_COMMUNITY)
Admission: RE | Admit: 2023-07-22 | Discharge: 2023-07-22 | Disposition: A | Discharge status: Home / Self Care | Payer: BC Managed Care – PPO | Source: Ambulatory Visit | Attending: Cardiology | Admitting: Cardiology

## 2023-07-24 NOTE — Progress Notes (Signed)
 Cardiac Individual Treatment Plan  Patient Details  Name: Nicholas [name] MRN: [id number] Date of Birth: 12-Feb-1960 Referring Provider:   Flowsheet Row CARDIAC REHAB PHASE II ORIENTATION from 03/25/2023 in [facility]  Referring Provider Lucas Pride MD  [Primary Cardiologist: Dr. Jayson McDowell]    Initial Encounter Date:  Flowsheet Row CARDIAC REHAB PHASE II ORIENTATION from 03/25/2023 in [facility]  Date 03/25/23    Visit Diagnosis: No diagnosis found.  Patient's Home Medications on Admission:  Current Outpatient Medications:    acetaminophen  (TYLENOL ) 500 MG tablet, Take 500 mg by mouth every 6 (six) hours as needed (for headaches)., Disp: , Rfl:    albuterol  (VENTOLIN  HFA) 108 (90 Base) MCG/ACT inhaler, Inhale 1-2 puffs into the lungs every 6 (six) hours as needed for wheezing or shortness of breath., Disp: , Rfl:    aspirin  EC 325 MG tablet, Take 1 tablet (325 mg total) by mouth daily., Disp: 100 tablet, Rfl: 4   BD PEN NEEDLE NANO U/F 32G X 4 MM MISC, USE AS DIRECTED FOUR TIMES DAILY., Disp: 100 each, Rfl: 2   Coenzyme Q10 (COQ10) 100 MG CAPS, Take 100 mg by mouth daily., Disp: , Rfl:    Continuous Glucose Receiver (FREESTYLE LIBRE 3 READER) DEVI, 1 Piece by Does not apply route once as needed for up to 1 dose., Disp: 1 each, Rfl: 0   Continuous Glucose Sensor (FREESTYLE LIBRE 3 PLUS SENSOR) MISC, Use to monitor glucose continuously as directed. Change sensor every 15 days., Disp: 6 each, Rfl: 0   gabapentin  (NEURONTIN ) 100 MG capsule, Take 100 mg by mouth daily. , Disp: , Rfl:    Insulin  Lispro Prot & Lispro (HUMALOG  MIX 75/25 KWIKPEN) (75-25) 100 UNIT/ML Kwikpen, Inject 40 Units into the skin 2 (two) times daily with a meal., Disp: 45 mL, Rfl: 1   metoprolol  tartrate (LOPRESSOR ) 50 MG tablet, Take 1 tablet (50 mg total) by mouth 2 (two) times daily., Disp: 180 tablet, Rfl: 3   nicotine  (NICODERM CQ  - DOSED IN MG/24 HR) 7 mg/24hr patch,  Place 1 patch (7 mg total) onto the skin daily., Disp: 28 patch, Rfl: 0   nitroGLYCERIN  (NITROSTAT ) 0.4 MG SL tablet, DISSOLVE ONE TABLET UNDER THE TONGUE EVERY 5 MINUTES AS NEEDED FOR CHEST PAIN.  DO NOT EXCEED A TOTAL OF 3 DOSES IN 15 MINUTES, Disp: 25 tablet, Rfl: 3   nortriptyline  (PAMELOR ) 10 MG capsule, Take 4 capsules (40 mg total) by mouth at bedtime., Disp: 120 capsule, Rfl: 5   pantoprazole  (PROTONIX ) 40 MG tablet, Take 40 mg by mouth daily before breakfast., Disp: , Rfl:    rosuvastatin  (CRESTOR ) 40 MG tablet, Take 40 mg by mouth at bedtime., Disp: , Rfl:    Semaglutide  (RYBELSUS ) 3 MG TABS, Take 1 tablet (3 mg total) by mouth daily before breakfast., Disp: 90 tablet, Rfl: 1   Ubrogepant  (UBRELVY ) 100 MG TABS, Take 1 tablet (100 mg total) by mouth daily as needed. Take one tablet at onset of headache, may repeat 1 tablet in 2 hours, no more than 2 tablets in 24 hours, Disp: 10 tablet, Rfl: 11  Past Medical History: Past Medical History:  Diagnosis Date   Anal lesion    Anal papilla/tags - external lesion   Bilateral carpal tunnel syndrome 11/24/2018   Colon polyp    Hyperplastic rectosigmoid polyp 9/08   Coronary atherosclerosis of native coronary artery    DES to PLB, BMS to mid and distal RCA, DES to  circumflex and distal LAD, 6/10 (5 stents)   Diabetic peripheral neuropathy (HCC) 11/24/2018   GERD (gastroesophageal reflux disease)    Headache    Migraines   Helicobacter pylori (H. pylori) 09/2009   Treated with helidac   Hiatal hernia    Pt denies   Hyperplastic colon polyp 2008   Mixed hyperlipidemia    Myocardial infarction (HCC) 06/2008   Seizures (HCC)    Related to stroke in 2018. None since then as of 01/2023   Stroke [facility])    January 2018   Type 2 diabetes mellitus (HCC)     Tobacco Use: Social History   Tobacco Use  Smoking Status Former   Current packs/day: 0.50   Average packs/day: 0.5 packs/day for 50.3 years (25.1 ttl pk-yrs)   Types: Cigarettes    Start date: 04/08/1973  Smokeless Tobacco Never    Labs: Review Flowsheet  More data exists      Latest Ref Rng & Units 10/24/2022 01/07/2023 02/09/2023 06/08/2023 07/07/2023  Labs for ITP Cardiac and Pulmonary Rehab  Cholestrol 0 - 200 mg/dL - 832  - 98  -  LDL (calc) 0 - 99 mg/dL - 92  - 19  -  HDL-C >59 mg/dL - 31  - 29  -  Trlycerides <150 mg/dL - 739  - 751  -  Hemoglobin A1c 0.0 - 7.0 % 9.6  - 11.0  - 13.9   PH, Arterial 7.35 - 7.45 - - 7.329  7.349  7.332  7.362  - -  PCO2 arterial 32 - 48 mmHg - - 45.4  45.0  42.6  39.5  - -  Bicarbonate 20.0 - 28.0 mmol/L - - 24.0  24.8  22.6  23.9  22.4  - -  TCO2 22 - 32 mmol/L - - 25  26  24  22  24  25  25  24  22  24   - -  Acid-base deficit 0.0 - 2.0 mmol/L - - 2.0  1.0  3.0  2.0  3.0  - -  O2 Saturation % - - 99  94  99  70  100  - -    Details       Multiple values from one day are sorted in reverse-chronological order         Capillary Blood Glucose: Lab Results  Component Value Date   GLUCAP 143 (H) 04/13/2023   GLUCAP 122 (H) 04/13/2023   GLUCAP 182 (H) 04/08/2023   GLUCAP 126 (H) 04/08/2023   GLUCAP 208 (H) 04/06/2023     Exercise Target Goals: Exercise Program Goal: Individual exercise prescription set using results from initial 6 min walk test and THRR while considering  patient's activity barriers and safety.   Exercise Prescription Goal: Starting with aerobic activity 30 plus minutes a day, 3 days per week for initial exercise prescription. Provide home exercise prescription and guidelines that participant acknowledges understanding prior to discharge.  Activity Barriers & Risk Stratification:  Activity Barriers & Cardiac Risk Stratification - 03/25/23 0813       Activity Barriers & Cardiac Risk Stratification   Activity Barriers None    Cardiac Risk Stratification High          6 Minute Walk:  6 Minute Walk     Row Name 03/25/23 0930         6 Minute Walk   Phase Initial     Distance 1075 feet      Walk Time 6 minutes     #  of Rest Breaks 0     MPH 2.92     METS 2.04     RPE 10     VO2 Peak 10.22     Symptoms No     Resting HR 88 bpm     Resting BP 90/62     Resting Oxygen Saturation  95 %     Exercise Oxygen Saturation  during 6 min walk 95 %     Max Ex. HR 95 bpm     Max Ex. BP 120/70     2 Minute Post BP 114/60        Oxygen Initial Assessment:   Oxygen Re-Evaluation:   Oxygen Discharge (Final Oxygen Re-Evaluation):   Initial Exercise Prescription:  Initial Exercise Prescription - 03/25/23 0900       Date of Initial Exercise RX and Referring Provider   Date 03/25/23    Referring Provider Lucas Pride MD   Primary Cardiologist: Dr. Jayson Sierras     Oxygen   Maintain Oxygen Saturation 88% or higher      Treadmill   MPH 2    Grade 0.5    Minutes 15    METs 2.67      REL-XR   Level 3    Speed 50    Minutes 15    METs 2.5      Prescription Details   Frequency (times per week) 3    Duration Progress to 30 minutes of continuous aerobic without signs/symptoms of physical distress      Intensity   THRR 40-80% of Max Heartrate 116-143    Ratings of Perceived Exertion 11-13    Perceived Dyspnea 0-4      Progression   Progression Continue to progress workloads to maintain intensity without signs/symptoms of physical distress.      Resistance Training   Training Prescription Yes    Weight 4 lb    Reps 10-15          Perform Capillary Blood Glucose checks as needed.  Exercise Prescription Changes:   Exercise Prescription Changes     Row Name 03/25/23 0900 04/13/23 1500 04/29/23 1300         Response to Exercise   Blood Pressure (Admit) 90/62 110/60 128/74     Blood Pressure (Exercise) 120/70 106/62 132/70     Blood Pressure (Exit) 114/60 102/60 106/62     Heart Rate (Admit) 88 bpm 50 bpm 63 bpm     Heart Rate (Exercise) 95 bpm 120 bpm 122 bpm     Heart Rate (Exit) 93 bpm 111 bpm 120 bpm     Oxygen Saturation (Admit) 95 % -- --      Oxygen Saturation (Exercise) 95 % -- --     Rating of Perceived Exertion (Exercise) 10 13 13      Symptoms none -- --     Comments walk test results -- --     Duration -- Continue with 30 min of aerobic exercise without signs/symptoms of physical distress. Continue with 30 min of aerobic exercise without signs/symptoms of physical distress.     Intensity -- THRR unchanged THRR unchanged       Progression   Progression -- Continue to progress workloads to maintain intensity without signs/symptoms of physical distress. Continue to progress workloads to maintain intensity without signs/symptoms of physical distress.       Resistance Training   Training Prescription -- Yes Yes     Weight -- 4 4  Reps -- 10-15 10-15       Treadmill   MPH -- 1.7 2     Grade -- 1 1     Minutes -- 15 15     METs -- 2.54 2.81       REL-XR   Level -- 3 5     Speed -- 46 52     Minutes -- 15 15     METs -- 2.6 2.9        Exercise Comments:   Exercise Comments     Row Name 04/06/23 0754           Exercise Comments First full day of exercise!  Patient was oriented to gym and equipment including functions, settings, policies, and procedures.  Patient's individual exercise prescription and treatment plan were reviewed.  All starting workloads were established based on the results of the 6 minute walk test done at initial orientation visit.  The plan for exercise progression was also introduced and progression will be customized based on patient's performance and goals.          Exercise Goals and Review:   Exercise Goals     Row Name 03/25/23 0933             Exercise Goals   Increase Physical Activity Yes       Intervention Provide advice, education, support and counseling about physical activity/exercise needs.;Develop an individualized exercise prescription for aerobic and resistive training based on initial evaluation findings, risk stratification, comorbidities and participant's  personal goals.       Expected Outcomes Short Term: Attend rehab on a regular basis to increase amount of physical activity.;Long Term: Add in home exercise to make exercise part of routine and to increase amount of physical activity.;Long Term: Exercising regularly at least 3-5 days a week.       Increase Strength and Stamina Yes       Intervention Develop an individualized exercise prescription for aerobic and resistive training based on initial evaluation findings, risk stratification, comorbidities and participant's personal goals.;Provide advice, education, support and counseling about physical activity/exercise needs.       Expected Outcomes Short Term: Perform resistance training exercises routinely during rehab and add in resistance training at home;Short Term: Increase workloads from initial exercise prescription for resistance, speed, and METs.;Long Term: Improve cardiorespiratory fitness, muscular endurance and strength as measured by increased METs and functional capacity ( )       Able to understand and use rate of perceived exertion (RPE) scale Yes       Intervention Provide education and explanation on how to use RPE scale       Expected Outcomes Short Term: Able to use RPE daily in rehab to express subjective intensity level;Long Term:  Able to use RPE to guide intensity level when exercising independently       Able to understand and use Dyspnea scale Yes       Intervention Provide education and explanation on how to use Dyspnea scale       Expected Outcomes Short Term: Able to use Dyspnea scale daily in rehab to express subjective sense of shortness of breath during exertion;Long Term: Able to use Dyspnea scale to guide intensity level when exercising independently       Knowledge and understanding of Target Heart Rate Range (THRR) Yes       Intervention Provide education and explanation of THRR including how the numbers were predicted and where they are located for reference  Expected Outcomes Short Term: Able to state/look up THRR;Long Term: Able to use THRR to govern intensity when exercising independently;Short Term: Able to use daily as guideline for intensity in rehab       Able to check pulse independently Yes       Intervention Provide education and demonstration on how to check pulse in carotid and radial arteries.;Review the importance of being able to check your own pulse for safety during independent exercise       Expected Outcomes Short Term: Able to explain why pulse checking is important during independent exercise;Long Term: Able to check pulse independently and accurately       Understanding of Exercise Prescription Yes       Intervention Provide education, explanation, and written materials on patient's individual exercise prescription       Expected Outcomes Short Term: Able to explain program exercise prescription;Long Term: Able to explain home exercise prescription to exercise independently          Exercise Goals Re-Evaluation :  Exercise Goals Re-Evaluation     Row Name 04/06/23 0755 04/13/23 0839 05/04/23 0802         Exercise Goal Re-Evaluation   Exercise Goals Review Knowledge and understanding of Target Heart Rate Range (THRR);Able to understand and use rate of perceived exertion (RPE) scale Increase Strength and Stamina;Increase Physical Activity --     Comments Reviewed RPE and dyspnea scale, THR and program prescription with pt today.  Pt voiced understanding and was given a copy of goals to take home Patient states that outside of the program, he walks a little bit at home as he has to walk on job sites a lot. States he still doesn't have much energy, that he eats well and drinks water  all the time. Encouraged patient to talk to his Cardiologist about taking a multivitamin and getting some blood work drawn to check for possible low Vit. D and Vit. B12 levels or other levels that could contribute to low energy. Pt states he will bring  this up at his heart doctor appt which is April 14th. Nicholas [name] states that most of his exercise comes from this program, he does do some walking outside of the program where he is busy on the job sites, but other than that this program is his main exercise. Eating and drinking well and still has his cardio follow up on the 14th.     Expected Outcomes Short: Use RPE daily to regulate intensity.  Long: Follow program prescription in THR. Short: Continue to attend program and exercise. Long: Exercise more at home and improve strength and stamina to regain energy levels. Short: Continue to attend program and exercise. Long: Exercise more at home and improve strength and stamina to regain energy levels.         Discharge Exercise Prescription (Final Exercise Prescription Changes):  Exercise Prescription Changes - 04/29/23 1300       Response to Exercise   Blood Pressure (Admit) 128/74    Blood Pressure (Exercise) 132/70    Blood Pressure (Exit) 106/62    Heart Rate (Admit) 63 bpm    Heart Rate (Exercise) 122 bpm    Heart Rate (Exit) 120 bpm    Rating of Perceived Exertion (Exercise) 13    Duration Continue with 30 min of aerobic exercise without signs/symptoms of physical distress.    Intensity THRR unchanged      Progression   Progression Continue to progress workloads to maintain intensity without signs/symptoms of physical  distress.      Resistance Training   Training Prescription Yes    Weight 4    Reps 10-15      Treadmill   MPH 2    Grade 1    Minutes 15    METs 2.81      REL-XR   Level 5    Speed 52    Minutes 15    METs 2.9          Nutrition:  Target Goals: Understanding of nutrition guidelines, daily intake of sodium 1500mg , cholesterol 200mg , calories 30% from fat and 7% or less from saturated fats, daily to have 5 or more servings of fruits and vegetables.  Biometrics:  Pre Biometrics - 03/25/23 0933       Pre Biometrics   Height 5' 8 (1.727 m)    Weight  75.2 kg    Waist Circumference 35.5 inches    Hip Circumference 35 inches    Waist to Hip Ratio 1.01 %    BMI (Calculated) 25.2    Grip Strength 30.7 kg    Single Leg Stand 20.3 seconds           Nutrition Therapy Plan and Nutrition Goals:  Nutrition Therapy & Goals - 03/25/23 0934       Intervention Plan   Intervention Prescribe, educate and counsel regarding individualized specific dietary modifications aiming towards targeted core components such as weight, hypertension, lipid management, diabetes, heart failure and other comorbidities.;Nutrition handout(s) given to patient.    Expected Outcomes Short Term Goal: Understand basic principles of dietary content, such as calories, fat, sodium, cholesterol and nutrients.;Long Term Goal: Adherence to prescribed nutrition plan.          Nutrition Assessments:  MEDIFICTS Score Key: >=70 Need to make dietary changes  40-70 Heart Healthy Diet <= 40 Therapeutic Level Cholesterol Diet   Picture Your Plate Scores: <59 Unhealthy dietary pattern with much room for improvement. 41-50 Dietary pattern unlikely to meet recommendations for good health and room for improvement. 51-60 More healthful dietary pattern, with some room for improvement.  >60 Healthy dietary pattern, although there may be some specific behaviors that could be improved.    Nutrition Goals Re-Evaluation:  Nutrition Goals Re-Evaluation     Row Name 04/13/23 9166 05/04/23 0810           Goals   Nutrition Goal Eat healthy consistently. Eat healthy consistently.      Comment Patient states he eats healthy most of the time. States he will have a cheat day every now and then, but not often. Nicholas [name] states that he eats healthy majority of the time and most of his liquid consumption is water .      Expected Outcome Short term: Continue to eat healthy. Long term: Still eat healthy and have cheat days in moderation. Short term: Continue to eat healthy. Long term: Still eat  healthy and have cheat days in moderation.         Nutrition Goals Discharge (Final Nutrition Goals Re-Evaluation):  Nutrition Goals Re-Evaluation - 05/04/23 0810       Goals   Nutrition Goal Eat healthy consistently.    Comment Nicholas [name] states that he eats healthy majority of the time and most of his liquid consumption is water .    Expected Outcome Short term: Continue to eat healthy. Long term: Still eat healthy and have cheat days in moderation.          Psychosocial: Target Goals: Acknowledge presence or absence  of significant depression and/or stress, maximize coping skills, provide positive support system. Participant is able to verbalize types and ability to use techniques and skills needed for reducing stress and depression.  Initial Review & Psychosocial Screening:  Initial Psych Review & Screening - 03/25/23 0901       Initial Review   Current issues with None Identified      Family Dynamics   Good Support System? Yes      Barriers   Psychosocial barriers to participate in program Psychosocial barriers identified (see note)      Screening Interventions   Interventions Encouraged to exercise;Program counselor consult;Provide feedback about the scores to participant;To provide support and resources with identified psychosocial needs    Expected Outcomes Short Term goal: Utilizing psychosocial counselor, staff and physician to assist with identification of specific Stressors or current issues interfering with healing process. Setting desired goal for each stressor or current issue identified.;Long Term Goal: Stressors or current issues are controlled or eliminated.;Short Term goal: Identification and review with participant of any Quality of Life or Depression concerns found by scoring the questionnaire.;Long Term goal: The participant improves quality of Life and PHQ9 Scores as seen by post scores and/or verbalization of changes          Quality of Life Scores:  Quality  of Life - 03/25/23 1338       Quality of Life   Select Quality of Life      Quality of Life Scores   Health/Function Pre 17.1 %    Socioeconomic Pre 17.5 %    Psych/Spiritual Pre 17.5 %    Family Pre 17.5 %    GLOBAL Pre 17.33 %         Scores of 19 and below usually indicate a poorer quality of life in these areas.  A difference of  2-3 points is a clinically meaningful difference.  A difference of 2-3 points in the total score of the Quality of Life Index has been associated with significant improvement in overall quality of life, self-image, physical symptoms, and general health in studies assessing change in quality of life.  PHQ-9: Review Flowsheet  More data exists      03/25/2023 09/03/2017 04/21/2017 09/30/2016 09/11/2016  Depression screen PHQ 2/9  Decreased Interest 0 0 0 0 0  Down, Depressed, Hopeless 0 0 - 0 0  PHQ - 2 Score 0 0 0 0 0  Altered sleeping 0 - - - -  Tired, decreased energy 0 - - - -  Change in appetite 0 - - - -  Feeling bad or failure about yourself  0 - - - -  Trouble concentrating 0 - - - -  Moving slowly or fidgety/restless 0 - - - -  Suicidal thoughts 0 - - - -  PHQ-9 Score 0 - - - -   Interpretation of Total Score  Total Score Depression Severity:  1-4 = Minimal depression, 5-9 = Mild depression, 10-14 = Moderate depression, 15-19 = Moderately severe depression, 20-27 = Severe depression   Psychosocial Evaluation and Intervention:   Psychosocial Re-Evaluation:  Psychosocial Re-Evaluation     Row Name 03/25/23 1005 04/13/23 0828 05/04/23 0811         Psychosocial Re-Evaluation   Current issues with None Identified Current Sleep Concerns None Identified     Comments 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 His PHQ9 score was 0. He denies any depression, anxiety or stressors. 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 He hopes to return to work soon. He quit smoking during his hospitalization for surgery and is using  nicotine  patches some now but not as much as when he started. He says he does not want to smoke now. He lives with his wife who is him main support person along with his daughter and son. He did the program 14 years ago after 2 stents and seems to be very motivated to participate again. His goals for the program are to continue to abstain from smoking, learn what he is able to do; and get his strength and stamina back. He has no barriers identified to complete the program. Pt states overall he is sleeping well. He has some nights that he doesn't sleep good because he just isn't tired, and other nights he sleeps well. He says this has been going on for a while now though. He states he does not have any current stress concerns. Pt states overall he is sleeping well and doesn't have any stressors that are bothering him. The sleep issues that were bothering him are not resolved.     Expected Outcomes Short Term: start the program and attend consistently. Long Term: Complete the program and meet his personal goals. Short term: continue the program consistently. Long term: Complete the program and meet personal goals. Short term: continue the program consistently. Long term: Complete the program and meet personal goals.     Interventions Stress management education;Relaxation education;Encouraged to attend Cardiac Rehabilitation for the exercise Encouraged to attend Cardiac Rehabilitation for the exercise Encouraged to attend Cardiac Rehabilitation for the exercise     Continue Psychosocial Services  No Follow up required Follow up required by staff Follow up required by staff        Psychosocial Discharge (Final Psychosocial Re-Evaluation):  Psychosocial Re-Evaluation - 05/04/23 0811       Psychosocial Re-Evaluation   Current issues with None Identified    Comments Pt states overall he is sleeping well and doesn't have any stressors that are bothering him. The sleep issues that were bothering him are not  resolved.    Expected Outcomes Short term: continue the program consistently. Long term: Complete the program and meet personal goals.    Interventions Encouraged to attend Cardiac Rehabilitation for the exercise    Continue Psychosocial Services  Follow up required by staff          Vocational Rehabilitation: Provide vocational rehab assistance to qualifying candidates.   Vocational Rehab Evaluation & Intervention:  Vocational Rehab - 03/25/23 0901       Initial Vocational Rehab Evaluation & Intervention   Assessment shows need for Vocational Rehabilitation No      Vocational Rehab Re-Evaulation   Comments Patient is self-employed and is returning to work soon.          Education: Education Goals: Education classes will be provided on a weekly basis, covering required topics. Participant will state understanding/return demonstration of topics presented.  Learning Barriers/Preferences:  Learning Barriers/Preferences - 03/25/23 0827       Learning Barriers/Preferences   Learning Barriers None    Learning Preferences Skilled Demonstration;Audio;Written Material          Education Topics: Hypertension, Hypertension Reduction -Define heart disease and high blood pressure. Discus how high blood pressure affects the body and ways to reduce high blood pressure. Flowsheet Row CARDIAC REHAB PHASE II EXERCISE from 04/29/2023 in [place] [facility]  Date 04/15/23  Educator [facility]  Instruction Review  Code 1- Verbalizes Understanding    Exercise and Your Heart -Discuss why it is important to exercise, the FITT principles of exercise, normal and abnormal responses to exercise, and how to exercise safely.   Angina -Discuss definition of angina, causes of angina, treatment of angina, and how to decrease risk of having angina.   Cardiac Medications -Review what the following cardiac medications are used for, how they affect the body, and side effects that may occur  when taking the medications.  Medications include Aspirin , Beta blockers, calcium  channel blockers, ACE Inhibitors, angiotensin receptor blockers, diuretics, digoxin , and antihyperlipidemics.   Congestive Heart Failure -Discuss the definition of CHF, how to live with CHF, the signs and symptoms of CHF, and how keep track of weight and sodium intake. Flowsheet Row CARDIAC REHAB PHASE II EXERCISE from 04/29/2023 in [place] [facility]  Date 04/15/23  Educator [facility]  Instruction Review Code 1- Verbalizes Understanding    Heart Disease and Intimacy -Discus the effect sexual activity has on the heart, how changes occur during intimacy as we age, and safety during sexual activity.   Smoking Cessation / COPD -Discuss different methods to quit smoking, the health benefits of quitting smoking, and the definition of COPD.   Nutrition I: Fats -Discuss the types of cholesterol, what cholesterol does to the heart, and how cholesterol levels can be controlled.   Nutrition II: Labels -Discuss the different components of food labels and how to read food label   Heart Parts/Heart Disease and PAD -Discuss the anatomy of the heart, the pathway of blood circulation through the heart, and these are affected by heart disease.   Stress I: Signs and Symptoms -Discuss the causes of stress, how stress may lead to anxiety and depression, and ways to limit stress. Flowsheet Row CARDIAC REHAB PHASE II EXERCISE from 04/29/2023 in [place] [facility]  Date 04/08/23  Educator [facility]  Instruction Review Code 1- Verbalizes Understanding    Stress II: Relaxation -Discuss different types of relaxation techniques to limit stress.   Warning Signs of Stroke / TIA -Discuss definition of a stroke, what the signs and symptoms are of a stroke, and how to identify when someone is having stroke.   Knowledge Questionnaire Score:  Knowledge Questionnaire Score - 03/25/23 0947       Knowledge  Questionnaire Score   Post Score [id number] (P)           Core Components/Risk Factors/Patient Goals at Admission:  Personal Goals and Risk Factors at Admission - 03/25/23 0901       Core Components/Risk Factors/Patient Goals on Admission    Weight Management Weight Maintenance;Yes    Intervention Weight Management: Develop a combined nutrition and exercise program designed to reach desired caloric intake, while maintaining appropriate intake of nutrient and fiber, sodium and fats, and appropriate energy expenditure required for the weight goal.;Weight Management: Provide education and appropriate resources to help participant work on and attain dietary goals.    Admit Weight 165 lb 11.2 oz (75.2 kg)    Goal Weight: Short Term 165 lb (74.8 kg)    Goal Weight: Long Term 165 lb (74.8 kg)    Expected Outcomes Short Term: Continue to assess and modify interventions until short term weight is achieved;Long Term: Adherence to nutrition and physical activity/exercise program aimed toward attainment of established weight goal;Weight Maintenance: Understanding of the daily nutrition guidelines, which includes 25-35% calories from fat, 7% or less cal from saturated fats, less than 200mg  cholesterol, less than 1.5gm  of sodium, & 5 or more servings of fruits and vegetables daily    Diabetes Yes    Intervention Provide education about signs/symptoms and action to take for hypo/hyperglycemia.;Provide education about proper nutrition, including hydration, and aerobic/resistive exercise prescription along with prescribed medications to achieve blood glucose in normal ranges: Fasting glucose 65-99 mg/dL    Expected Outcomes Short Term: Participant verbalizes understanding of the signs/symptoms and immediate care of hyper/hypoglycemia, proper foot care and importance of medication, aerobic/resistive exercise and nutrition plan for blood glucose control.;Long Term: Attainment of HbA1C < 7%.    Hypertension Yes     Intervention Provide education on lifestyle modifcations including regular physical activity/exercise, weight management, moderate sodium restriction and increased consumption of fresh fruit, vegetables, and low fat dairy, alcohol moderation, and smoking cessation.;Monitor prescription use compliance.    Expected Outcomes Short Term: Continued assessment and intervention until BP is < 140/40mm HG in hypertensive participants. < 130/37mm HG in hypertensive participants with diabetes, heart failure or chronic kidney disease.;Long Term: Maintenance of blood pressure at goal levels.    Lipids Yes    Intervention Provide education and support for participant on nutrition & aerobic/resistive exercise along with prescribed medications to achieve LDL 70mg , HDL >40mg .    Expected Outcomes Short Term: Participant states understanding of desired cholesterol values and is compliant with medications prescribed. Participant is following exercise prescription and nutrition guidelines.;Long Term: Cholesterol controlled with medications as prescribed, with individualized exercise RX and with personalized nutrition plan. Value goals: LDL < 70mg , HDL > 40 mg.          Core Components/Risk Factors/Patient Goals Review:   Goals and Risk Factor Review     Row Name 04/13/23 0835 05/04/23 0808           Core Components/Risk Factors/Patient Goals Review   Personal Goals Review Hypertension;Diabetes;Lipids Hypertension;Diabetes;Lipids      Review Patient states that he takes his medications like he is supposed to. The first time he came to visit us , he was unable to participate in exercise because of his blood sugar being to high. The reason was he forgot to take his long acting insulin  the night before. Nicholas [name] states he is more compliant with his medications and his blood sugars have been a lot better since he has came to see us  since. Patient states that he takes his medications like he is supposed to. The first time he  came to visit us , he was unable to participate in exercise because of his blood sugar being to high. The reason was he forgot to take his long acting insulin  the night before. Nicholas [name] states he is more compliant with his medications and his blood sugars have been a lot better since he has came to see us  since. He states his blood sugars are still half and half with being under control and high.      Expected Outcomes Short: Continue taking medications as prescribed. Long: Continue medications and get blood sugar better under control with diet and exercise. Short: Continue taking medications as prescribed. Long: Continue medications and get blood sugar better under control with diet and exercise.         Core Components/Risk Factors/Patient Goals at Discharge (Final Review):   Goals and Risk Factor Review - 05/04/23 0808       Core Components/Risk Factors/Patient Goals Review   Personal Goals Review Hypertension;Diabetes;Lipids    Review Patient states that he takes his medications like he is supposed to. The first time he  came to visit us , he was unable to participate in exercise because of his blood sugar being to high. The reason was he forgot to take his long acting insulin  the night before. Nicholas [name] states he is more compliant with his medications and his blood sugars have been a lot better since he has came to see us  since. He states his blood sugars are still half and half with being under control and high.    Expected Outcomes Short: Continue taking medications as prescribed. Long: Continue medications and get blood sugar better under control with diet and exercise.          ITP Comments:  ITP Comments     Row Name 04/06/23 0754 04/22/23 0803 05/20/23 0822 06/17/23 0800 07/15/23 0746   ITP Comments First full day of exercise!  Patient was oriented to gym and equipment including functions, settings, policies, and procedures.  Patient's individual exercise prescription and treatment plan were  reviewed.  All starting workloads were established based on the results of the 6 minute walk test done at initial orientation visit.  The plan for exercise progression was also introduced and progression will be customized based on patient's performance and goals. 30 day review completed. ITP sent to Dr. Dorn Ross, Medical Director of Cardiac Rehab. Continue with ITP unless changes are made by physician. 30 day review completed. ITP sent to Dr. Dorn Ross, Medical Director of Cardiac Rehab. Continue with ITP unless changes are made by physician. Nicholas [name] has been out since 05/11/23. 30 day review completed. ITP sent to Dr. Dorn Ross, Medical Director of Cardiac Rehab. Continue with ITP unless changes are made by physician. Nicholas [name] has been out since 05/11/23. When called, said he has been out of town with work. 30 day review completed. ITP sent to Dr. Dorn Ross, Medical Director of Cardiac Rehab. Continue with ITP unless changes are made by physician. Nicholas [name] has been out since 05/11/23. When called, said he has been out of town with work.      Comments: Discharge ITP

## 2023-07-24 NOTE — Progress Notes (Signed)
 Discharge Progress Report  Patient Details  Name: Nicholas [name] MRN: [id number] Date of Birth: August 20, 1960 Referring Provider:   Flowsheet Row CARDIAC REHAB PHASE II ORIENTATION from 03/25/2023 in [facility] CARD[facility]AC REH[facility]ATION  Referring Provider Lucas Pride MD  [Primary Cardiologist: Dr. Jayson McDowell]     Number of Visits: 12  Reason for Discharge:  Early Exit:  Lack of attendance  Smoking History:  Social History   Tobacco Use  Smoking Status Former   Current packs/day: 0.50   Average packs/day: 0.5 packs/day for 50.3 years (25.1 ttl pk-yrs)   Types: Cigarettes   Start date: 04/08/1973  Smokeless Tobacco Never    Diagnosis:  No diagnosis found.  ADL [facility]:   Initial Exercise Prescription:  Initial Exercise Prescription - 03/25/23 0900       Date of Initial Exercise RX and Referring Provider   Date 03/25/23    Referring Provider Lucas Pride MD   Primary Cardiologist: Dr. Jayson Sierras     Oxygen   Maintain Oxygen Saturation 88% or higher      Treadmill   MPH 2    Grade 0.5    Minutes 15    METs 2.67      REL-XR   Level 3    Speed 50    Minutes 15    METs 2.5      Prescription Details   Frequency (times per week) 3    Duration Progress to 30 minutes of continuous aerobic without signs/symptoms of physical distress      Intensity   THRR 40-80% of Max Heartrate 116-143    Ratings of Perceived Exertion 11-13    Perceived Dyspnea 0-4      Progression   Progression Continue to progress workloads to maintain intensity without signs/symptoms of physical distress.      Resistance Training   Training Prescription Yes    Weight 4 lb    Reps 10-15          Discharge Exercise Prescription (Final Exercise Prescription Changes):  Exercise Prescription Changes - 04/29/23 1300       Response to Exercise   Blood Pressure (Admit) 128/74    Blood Pressure (Exercise) 132/70    Blood Pressure (Exit) 106/62    Heart Rate (Admit) 63 bpm     Heart Rate (Exercise) 122 bpm    Heart Rate (Exit) 120 bpm    Rating of Perceived Exertion (Exercise) 13    Duration Continue with 30 min of aerobic exercise without signs/symptoms of physical distress.    Intensity THRR unchanged      Progression   Progression Continue to progress workloads to maintain intensity without signs/symptoms of physical distress.      Resistance Training   Training Prescription Yes    Weight 4    Reps 10-15      Treadmill   MPH 2    Grade 1    Minutes 15    METs 2.81      REL-XR   Level 5    Speed 52    Minutes 15    METs 2.9          Functional Capacity:  6 Minute Walk     Row Name 03/25/23 0930         6 Minute Walk   Phase Initial     Distance 1075 feet     Walk Time 6 minutes     # of Rest Breaks 0     MPH 2.92  METS 2.04     RPE 10     VO2 Peak 10.22     Symptoms No     Resting HR 88 bpm     Resting BP 90/62     Resting Oxygen Saturation  95 %     Exercise Oxygen Saturation  during 6 min walk 95 %     Max Ex. HR 95 bpm     Max Ex. BP 120/70     2 Minute Post BP 114/60        Psychological, QOL, Others - Outcomes: PHQ 2/9:    03/25/2023    8:59 AM 09/03/2017    8:31 AM 04/21/2017    9:03 AM 09/30/2016    8:34 AM 09/11/2016    8:21 AM  Depression screen PHQ 2/9  Decreased Interest 0 0 0 0 0  Down, Depressed, Hopeless 0 0  0 0  PHQ - 2 Score 0 0 0 0 0  Altered sleeping 0      Tired, decreased energy 0      Change in appetite 0      Feeling bad or failure about yourself  0      Trouble concentrating 0      Moving slowly or fidgety/restless 0      Suicidal thoughts 0      PHQ-9 Score 0        Quality of Life:  Quality of Life - 03/25/23 1338       Quality of Life   Select Quality of Life      Quality of Life Scores   Health/Function Pre 17.1 %    Socioeconomic Pre 17.5 %    Psych/Spiritual Pre 17.5 %    Family Pre 17.5 %    GLOBAL Pre 17.33 %          Personal Goals: Goals established at  orientation with interventions provided to work toward goal.  Personal Goals and Risk Factors at Admission - 03/25/23 0901       Core Components/Risk Factors/Patient Goals on Admission    Weight Management Weight Maintenance;Yes    Intervention Weight Management: Develop a combined nutrition and exercise program designed to reach desired caloric intake, while maintaining appropriate intake of nutrient and fiber, sodium and fats, and appropriate energy expenditure required for the weight goal.;Weight Management: Provide education and appropriate resources to help participant work on and attain dietary goals.    Admit Weight 165 lb 11.2 oz (75.2 kg)    Goal Weight: Short Term 165 lb (74.8 kg)    Goal Weight: Long Term 165 lb (74.8 kg)    Expected Outcomes Short Term: Continue to assess and modify interventions until short term weight is achieved;Long Term: Adherence to nutrition and physical activity/exercise program aimed toward attainment of established weight goal;Weight Maintenance: Understanding of the daily nutrition guidelines, which includes 25-35% calories from fat, 7% or less cal from saturated fats, less than 200mg  cholesterol, less than 1.5gm of sodium, & 5 or more servings of fruits and vegetables daily    Diabetes Yes    Intervention Provide education about signs/symptoms and action to take for hypo/hyperglycemia.;Provide education about proper nutrition, including hydration, and aerobic/resistive exercise prescription along with prescribed medications to achieve blood glucose in normal ranges: Fasting glucose 65-99 mg/dL    Expected Outcomes Short Term: Participant verbalizes understanding of the signs/symptoms and immediate care of hyper/hypoglycemia, proper foot care and importance of medication, aerobic/resistive exercise and nutrition plan for blood glucose control.;Long  Term: Attainment of HbA1C < 7%.    Hypertension Yes    Intervention Provide education on lifestyle modifcations  including regular physical activity/exercise, weight management, moderate sodium restriction and increased consumption of fresh fruit, vegetables, and low fat dairy, alcohol moderation, and smoking cessation.;Monitor prescription use compliance.    Expected Outcomes Short Term: Continued assessment and intervention until BP is < 140/75mm HG in hypertensive participants. < 130/75mm HG in hypertensive participants with diabetes, heart failure or chronic kidney disease.;Long Term: Maintenance of blood pressure at goal levels.    Lipids Yes    Intervention Provide education and support for participant on nutrition & aerobic/resistive exercise along with prescribed medications to achieve LDL 70mg , HDL >40mg .    Expected Outcomes Short Term: Participant states understanding of desired cholesterol values and is compliant with medications prescribed. Participant is following exercise prescription and nutrition guidelines.;Long Term: Cholesterol controlled with medications as prescribed, with individualized exercise RX and with personalized nutrition plan. Value goals: LDL < 70mg , HDL > 40 mg.           Personal Goals Discharge:  Goals and Risk Factor Review     Row Name 04/13/23 0835 05/04/23 0808           Core Components/Risk Factors/Patient Goals Review   Personal Goals Review Hypertension;Diabetes;Lipids Hypertension;Diabetes;Lipids      Review Patient states that he takes his medications like he is supposed to. The first time he came to visit us , he was unable to participate in exercise because of his blood sugar being to high. The reason was he forgot to take his long acting insulin  the night before. Joe states he is more compliant with his medications and his blood sugars have been a lot better since he has came to see us  since. Patient states that he takes his medications like he is supposed to. The first time he came to visit us , he was unable to participate in exercise because of his blood  sugar being to high. The reason was he forgot to take his long acting insulin  the night before. Joe states he is more compliant with his medications and his blood sugars have been a lot better since he has came to see us  since. He states his blood sugars are still half and half with being under control and high.      Expected Outcomes Short: Continue taking medications as prescribed. Long: Continue medications and get blood sugar better under control with diet and exercise. Short: Continue taking medications as prescribed. Long: Continue medications and get blood sugar better under control with diet and exercise.         Exercise Goals and Review:  Exercise Goals     Row Name 03/25/23 0933             Exercise Goals   Increase Physical Activity Yes       Intervention Provide advice, education, support and counseling about physical activity/exercise needs.;Develop an individualized exercise prescription for aerobic and resistive training based on initial evaluation findings, risk stratification, comorbidities and participant's personal goals.       Expected Outcomes Short Term: Attend rehab on a regular basis to increase amount of physical activity.;Long Term: Add in home exercise to make exercise part of routine and to increase amount of physical activity.;Long Term: Exercising regularly at least 3-5 days a week.       Increase Strength and Stamina Yes       Intervention Develop an individualized exercise prescription for aerobic  and resistive training based on initial evaluation findings, risk stratification, comorbidities and participant's personal goals.;Provide advice, education, support and counseling about physical activity/exercise needs.       Expected Outcomes Short Term: Perform resistance training exercises routinely during rehab and add in resistance training at home;Short Term: Increase workloads from initial exercise prescription for resistance, speed, and METs.;Long Term: Improve  cardiorespiratory fitness, muscular endurance and strength as measured by increased METs and functional capacity ( )       Able to understand and use rate of perceived exertion (RPE) scale Yes       Intervention Provide education and explanation on how to use RPE scale       Expected Outcomes Short Term: Able to use RPE daily in rehab to express subjective intensity level;Long Term:  Able to use RPE to guide intensity level when exercising independently       Able to understand and use Dyspnea scale Yes       Intervention Provide education and explanation on how to use Dyspnea scale       Expected Outcomes Short Term: Able to use Dyspnea scale daily in rehab to express subjective sense of shortness of breath during exertion;Long Term: Able to use Dyspnea scale to guide intensity level when exercising independently       Knowledge and understanding of Target Heart Rate Range (THRR) Yes       Intervention Provide education and explanation of THRR including how the numbers were predicted and where they are located for reference       Expected Outcomes Short Term: Able to state/look up THRR;Long Term: Able to use THRR to govern intensity when exercising independently;Short Term: Able to use daily as guideline for intensity in rehab       Able to check pulse independently Yes       Intervention Provide education and demonstration on how to check pulse in carotid and radial arteries.;Review the importance of being able to check your own pulse for safety during independent exercise       Expected Outcomes Short Term: Able to explain why pulse checking is important during independent exercise;Long Term: Able to check pulse independently and accurately       Understanding of Exercise Prescription Yes       Intervention Provide education, explanation, and written materials on patient's individual exercise prescription       Expected Outcomes Short Term: Able to explain program exercise prescription;Long  Term: Able to explain home exercise prescription to exercise independently          Exercise Goals Re-Evaluation:  Exercise Goals Re-Evaluation     Row Name 04/06/23 0755 04/13/23 0839 05/04/23 0802         Exercise Goal Re-Evaluation   Exercise Goals Review Knowledge and understanding of Target Heart Rate Range (THRR);Able to understand and use rate of perceived exertion (RPE) scale Increase Strength and Stamina;Increase Physical Activity --     Comments Reviewed RPE and dyspnea scale, THR and program prescription with pt today.  Pt voiced understanding and was given a copy of goals to take home Patient states that outside of the program, he walks a little bit at home as he has to walk on job sites a lot. States he still doesn't have much energy, that he eats well and drinks water  all the time. Encouraged patient to talk to his Cardiologist about taking a multivitamin and getting some blood work drawn to check for possible low Vit. D and Vit.  B12 levels or other levels that could contribute to low energy. Pt states he will bring this up at his heart doctor appt which is April 14th. Joe states that most of his exercise comes from this program, he does do some walking outside of the program where he is busy on the job sites, but other than that this program is his main exercise. Eating and drinking well and still has his cardio follow up on the 14th.     Expected Outcomes Short: Use RPE daily to regulate intensity.  Long: Follow program prescription in THR. Short: Continue to attend program and exercise. Long: Exercise more at home and improve strength and stamina to regain energy levels. Short: Continue to attend program and exercise. Long: Exercise more at home and improve strength and stamina to regain energy levels.        Nutrition & Weight - Outcomes:  Pre Biometrics - 03/25/23 0933       Pre Biometrics   Height 5' 8 (1.727 m)    Weight 75.2 kg    Waist Circumference 35.5 inches     Hip Circumference 35 inches    Waist to Hip Ratio 1.01 %    BMI (Calculated) 25.2    Grip Strength 30.7 kg    Single Leg Stand 20.3 seconds           Nutrition:  Nutrition Therapy & Goals - 03/25/23 0934       Intervention Plan   Intervention Prescribe, educate and counsel regarding individualized specific dietary modifications aiming towards targeted core components such as weight, hypertension, lipid management, diabetes, heart failure and other comorbidities.;Nutrition handout(s) given to patient.    Expected Outcomes Short Term Goal: Understand basic principles of dietary content, such as calories, fat, sodium, cholesterol and nutrients.;Long Term Goal: Adherence to prescribed nutrition plan.          Nutrition Discharge:   Education Questionnaire Score:  Knowledge Questionnaire Score - 03/25/23 0947       Knowledge Questionnaire Score   Post Score [id number] (P)           Goals reviewed with patient; copy given to patient.

## 2023-07-27 ENCOUNTER — Encounter (HOSPITAL_COMMUNITY): Payer: BC Managed Care – PPO

## 2023-07-27 ENCOUNTER — Other Ambulatory Visit: Payer: Self-pay | Admitting: *Deleted

## 2023-07-27 DIAGNOSIS — Z122 Encounter for screening for malignant neoplasm of respiratory organs: Secondary | ICD-10-CM

## 2023-07-27 DIAGNOSIS — Z87891 Personal history of nicotine dependence: Secondary | ICD-10-CM

## 2023-07-27 NOTE — Progress Notes (Signed)
 Orders placed for LDCT. Appts made.  Patient is aware of appt date and time.

## 2023-08-04 ENCOUNTER — Other Ambulatory Visit: Payer: Self-pay | Admitting: Hematology

## 2023-08-04 DIAGNOSIS — Z122 Encounter for screening for malignant neoplasm of respiratory organs: Secondary | ICD-10-CM

## 2023-08-04 DIAGNOSIS — Z87891 Personal history of nicotine dependence: Secondary | ICD-10-CM

## 2023-08-05 ENCOUNTER — Ambulatory Visit (HOSPITAL_COMMUNITY)

## 2023-08-07 ENCOUNTER — Other Ambulatory Visit: Payer: Self-pay | Admitting: "Endocrinology

## 2023-08-07 DIAGNOSIS — E1159 Type 2 diabetes mellitus with other circulatory complications: Secondary | ICD-10-CM

## 2023-08-27 ENCOUNTER — Other Ambulatory Visit: Payer: Self-pay | Admitting: "Endocrinology

## 2023-08-27 DIAGNOSIS — E1159 Type 2 diabetes mellitus with other circulatory complications: Secondary | ICD-10-CM

## 2023-09-20 NOTE — Progress Notes (Deleted)
 No chief complaint on file.   HISTORY OF PRESENT ILLNESS:  09/20/23 ALL:  Nicholas [name] returns for follow up for migraines. He continues nortriptyline  40mg  at bedtime and Ubrelvy  as needed.   09/17/2022 ALL: Nicholas [name] returns for follow up for migraines. He continues nortriptyline  40mg  at bedtime and Ubrelvy  as needed. Migraines are very well managed. He may have had 2 over the past year. Ubrelvy  worked well for abortive therapy. He occasionally has a tension style headache, maybe once per month. Less when he stays well hydrated. He continues close follow up with PCP and endocrinology. Last LDL 77. He continues rosuvastatin  (increased to 40mg  recently) and asa. A1C 11.2 06/2022. He admits CBGs are much better when he eats like he is supposed to and takes Humalog . He has a Libre monitor that has helped monitor CBGs. He drives without difficulty. No concerns of stroke symptoms. He likes to ride horses. He wears a helmet. He is a maintenance/handyman.   09/16/2021 ALL: Nicholas [name] returns for follow up for migraines. He was last seen 02/2021 and doing fairly well but called in 04/2021 with worsening headaches so we increased nortriptyline  to 40mg  daily and continued Ubrelvy  as needed. He reports doing well from a headache standpoint. He may have an occasional tension headache but is easily aborted. Has taken Ubrelvy  1-2 times and it worked well. He reports increase in A1C over the past year from 7 to 10. He is working closely with endocrinology. He admits that he does not drink much water  and drinks more caffeine. He continues rosuvastatin  and asa. No stroke like symptoms.   03/18/2021 ALL:  Nicholas [name] returns for migraines follow up. He continues nortriptyline  30mg  daily at bedtime and rizatriptan  as needed. He feels that headaches are usually well managed. He has had milder tension type headaches about 4 times a month. He has taken rizatriptan  about 10 times over the past 5 months to avoid migraine. He had one intractable migraine  1/23 and started on divalproex  taper. He took divalproex  500mg  for two days and had visual disturbances (zig zag lines, halos). Symptoms improved with decreased dose to 250mg  daily. Headache was aborted with taper. He feels that he has been doing pretty well, since. He admits that he does not drink water . He usually drinks coffee or green tea.   He weaned Vimpat  as directed. He denies stroke or seizure symptoms. He is taking asa 81mg  and rosuvastatin  10mg  daily (increased in 12/2020). A1C was 9.5 in 12/2020. He is taking Humalog  75/25 30 units twice daily. LDL was 89, triglycerides were 215. He is followed by PCP and endo regularly.   11/07/2020 ALL:  [name] is a 63 y.o. male here today for follow up for headaches and subdural hematoma. Follow up CT reassuring for resolving subdural 06/2020. He continues Vimpat  500mg  BID for concerns of possible seizure like activity most likely complicated migraine. No obvious seizure activity. He was started on topiramate  for complex migraines but could not tolerate and was switched to nortriptyline . He reports headaches are significantly better. He has had two migraines that he was able to abort with rizatriptan , however, has not had any significant migraines in a couple of months. He titrated nortriptyline  to 30mg  about 4 weeks ago and is tolerating well. May take Tylenol  1-2 times a week for mild headaches. He is feeling well, today. He continues rosuvastatin  and asa 81mg  for stroke prevention.    HISTORY (copied from Dr Jenel' previous note)  [name] is a 63 year old  right-handed white male with a history of cerebrovascular disease with a prior small left occipital stroke in the past.  The patient has history of coronary artery disease, diabetes, and ongoing tobacco abuse.  He continues to smoke a pack of cigarettes daily.  He has gastroesophageal reflux disease.  The patient was admitted to the hospital on 09 May 2020 with a visual disturbance, headache,  and some confusion/aphasia.  The patient reported that in February 2022 that he bumped his head when he fell and hit the left frontotemporal area of his head on a tree.  The patient did have some minor headaches afterwards but this was not an issue for him.  In the third week of March 2022, he went skeet shooting and noted that when he fired a shotgun that he would get an uncomfortable sensation in the head, if he did not use this shotgun, the sensation did not occur.  On the April 13 admission, he presented with some tunnel vision issues, he had visual disturbance on both visual fields in the periphery and had preservation of center vision.  He had a severe headache, and he also appeared to be confused, he was trying to talk but had garbled speech that was not understandable.  When he went to the hospital, CT and MRI evaluation demonstrated bilateral small subdural hematoma.  No stroke was noted.  The patient was taken off of Plavix  but kept on aspirin .  He was transiently treated with Keppra  but was taken off the medication upon discharge.  The patient returned to the hospital on 21 May 2020 with some headache and slight confusion but no visual disturbance.  The patient at that point was placed on Vimpat  and has remained on 50 mg twice daily.  The patient has had some minor headaches since coming out of the hospital but no severe headaches or visual disturbance or aphasia has been noted.  The patient reported no numbness or weakness of the face, arms, or legs.  He denied any gait disturbance or difficulty controlling the bowels or the bladder.  He does have some minor neck discomfort.  The patient comes to this office for further evaluation.  He remains on aspirin , off the Plavix .  He has not been operating a motor vehicle.   REVIEW OF SYSTEMS: Out of a complete 14 system review of symptoms, the patient complains only of the following symptoms, headaches and all other reviewed systems are  negative.   ALLERGIES: Allergies  Allergen Reactions   Keppra  [Levetiracetam ]     Hallucination   Topamax  [Topiramate ]     irritable     HOME MEDICATIONS: Outpatient Medications Prior to Visit  Medication Sig Dispense Refill   acetaminophen  (TYLENOL ) 500 MG tablet Take 500 mg by mouth every 6 (six) hours as needed (for headaches).     albuterol  (VENTOLIN  HFA) 108 (90 Base) MCG/ACT inhaler Inhale 1-2 puffs into the lungs every 6 (six) hours as needed for wheezing or shortness of breath.     aspirin  EC 325 MG tablet Take 1 tablet (325 mg total) by mouth daily. 100 tablet 4   BD PEN NEEDLE NANO U/F 32G X 4 MM MISC USE AS DIRECTED FOUR TIMES DAILY. 100 each 2   Coenzyme Q10 (COQ10) 100 MG CAPS Take 100 mg by mouth daily.     Continuous Glucose Receiver (FREESTYLE LIBRE 3 READER) DEVI 1 Piece by Does not apply route once as needed for up to 1 dose. 1 each 0   Continuous  Glucose Sensor (FREESTYLE LIBRE 3 PLUS SENSOR) MISC USE AS DIRECTED TO MONITOR GLUCOSE CONTINUOUSLY AS DIRECTED. CHANGE SENSOR EVERY 15 DAYS. 6 each 0   gabapentin  (NEURONTIN ) 100 MG capsule Take 100 mg by mouth daily.      Insulin  Lispro Prot & Lispro (HUMALOG  MIX 75/25 KWIKPEN) (75-25) 100 UNIT/ML Kwikpen Inject 40 Units into the skin 2 (two) times daily with a meal. 75 mL 0   metoprolol  tartrate (LOPRESSOR ) 50 MG tablet Take 1 tablet (50 mg total) by mouth 2 (two) times daily. 180 tablet 3   nicotine  (NICODERM CQ  - DOSED IN MG/24 HR) 7 mg/24hr patch Place 1 patch (7 mg total) onto the skin daily. 28 patch 0   nitroGLYCERIN  (NITROSTAT ) 0.4 MG SL tablet DISSOLVE ONE TABLET UNDER THE TONGUE EVERY 5 MINUTES AS NEEDED FOR CHEST PAIN.  DO NOT EXCEED A TOTAL OF 3 DOSES IN 15 MINUTES 25 tablet 3   nortriptyline  (PAMELOR ) 10 MG capsule Take 4 capsules (40 mg total) by mouth at bedtime. 120 capsule 5   pantoprazole  (PROTONIX ) 40 MG tablet Take 40 mg by mouth daily before breakfast.     rosuvastatin  (CRESTOR ) 40 MG tablet Take 40 mg  by mouth at bedtime.     Semaglutide  (RYBELSUS ) 3 MG TABS Take 1 tablet (3 mg total) by mouth daily before breakfast. 90 tablet 1   Ubrogepant  (UBRELVY ) 100 MG TABS Take 1 tablet (100 mg total) by mouth daily as needed. Take one tablet at onset of headache, may repeat 1 tablet in 2 hours, no more than 2 tablets in 24 hours 10 tablet 11   No facility-administered medications prior to visit.     PAST MEDICAL HISTORY: Past Medical History:  Diagnosis Date   Anal lesion    Anal papilla/tags - external lesion   Bilateral carpal tunnel syndrome 11/24/2018   Colon polyp    Hyperplastic rectosigmoid polyp 9/08   Coronary atherosclerosis of native coronary artery    DES to PLB, BMS to mid and distal RCA, DES to circumflex and distal LAD, 6/10 (5 stents)   Diabetic peripheral neuropathy (HCC) 11/24/2018   GERD (gastroesophageal reflux disease)    Headache    Migraines   Helicobacter pylori (H. pylori) 09/2009   Treated with helidac   Hiatal hernia    Pt denies   Hyperplastic colon polyp 2008   Mixed hyperlipidemia    Myocardial infarction (HCC) 06/2008   Seizures (HCC)    Related to stroke in 2018. None since then as of 01/2023   Stroke [facility])    January 2018   Type 2 diabetes mellitus [facility])      PAST SURGICAL HISTORY: Past Surgical History:  Procedure Laterality Date   CHOLECYSTECTOMY     COLLAPSED LUNG     15 years ago   COLONOSCOPY  10/12/2006   Dr. Shaaron- rectosigmoid polyp s/phot snare polypectomy o/w normal rectum and terminal ileum. hyperplastic polyp on bx   COLONOSCOPY WITH PROPOFOL  N/A 09/01/2019   Procedure: COLONOSCOPY WITH PROPOFOL ;  Surgeon: Shaaron Lamar HERO, MD;  Location: [facility] END[facility] SUITE;  Service: Endoscopy;  Laterality: N/A;  8:30am   CORONARY ARTERY BYPASS GRAFT N/A 02/09/2023   Procedure: CORONARY ARTERY BYPASS GRAFTING X 5, USING LEFT INTERNAL MAMMARY ARTERY AND ENDOSCOPICALLY HARVESTED LEFT SAPHENOUS VEIN GRAFT;  Surgeon: Lucas Dorise POUR, MD;  Location: [facility] OR;   Service: Open Heart Surgery;  Laterality: N/A;   EP IMPLANTABLE DEVICE N/A 02/12/2016   Procedure: Loop Recorder Insertion;  Surgeon: Lynwood  Allred, MD;  Location: [facility] INVASIVE CV LAB;  Service: Cardiovascular;  Laterality: N/A;   ESOPHAGOGASTRODUODENOSCOPY  10/12/2006   Dr. Shaaron- examination of the tubular esophagus revealed no mucosal abnormalities. the EG junction was easily traversed. small hiatal hernia, the gastric mucosa o/w appeared normal. there was no infiltrating process or frank ulcer seen.   implantable loop recorder removal  09/29/2019   MDT Reveal LINQ removed in office by Dr Kelsie   LEFT HEART CATH AND CORONARY ANGIOGRAPHY N/A 01/13/2023   Procedure: LEFT HEART CATH AND CORONARY ANGIOGRAPHY;  Surgeon: Wonda Sharper, MD;  [facility];  Service: Cardiovascular;  Laterality: N/A;   TEE WITHOUT CARDIOVERSION N/A 02/12/2016   Procedure: TRANSESOPHAGEAL ECHOCARDIOGRAM (TEE);  Surgeon: Oneil JAYSON Parchment, MD;  Location: [facility] ENDOSCOPY;  Service: Cardiovascular;  Laterality: N/A;   TEE WITHOUT CARDIOVERSION N/A 02/09/2023   Procedure: TRANSESOPHAGEAL ECHOCARDIOGRAM (TEE);  Surgeon: Lucas Dorise POUR, MD;  Location: [facility] OR;  Service: Open Heart Surgery;  Laterality: N/A;   WIRE IN APEX OF RIGHT LUNG     Since childhood     FAMILY HISTORY: Family History  Problem Relation Age of Onset   Diabetes Mother    Heart disease Father    Cancer Brother    CVA Neg Hx      SOCIAL HISTORY: Social History   Socioeconomic History   Marital status: Married    Spouse name: Not on file   Number of children: Not on file   Years of education: Not on file   Highest education level: Not on file  Occupational History   Occupation: maintenance, parking lots    Employer: SELF-EMPLOYED  Tobacco Use   Smoking status: Former    Current packs/day: 0.50    Average packs/day: 0.5 packs/day for 50.4 years (25.2 ttl pk-yrs)    Types: Cigarettes    Start date: 04/08/1973   Smokeless tobacco:  Never  Vaping Use   Vaping status: Never Used  Substance and Sexual Activity   Alcohol use: No    Alcohol/week: 0.0 standard drinks of alcohol   Drug use: No   Sexual activity: Yes    Partners: Female    Birth control/protection: Pill  Other Topics Concern   Not on file  Social History Narrative   Drinks Coffee 2 cups daily.  Lives sat home with wife.   Is self employed.  Graduated from High school.     Social Drivers of Corporate investment banker Strain: Not on file  Food Insecurity: No Food Insecurity (02/09/2023)   Hunger Vital Sign    Worried About Running Out of Food in the Last Year: Never true    Ran Out of Food in the Last Year: Never true  Transportation Needs: No Transportation Needs (02/09/2023)   PRAPARE - Administrator, Civil Service (Medical): No    Lack of Transportation (Non-Medical): No  Physical Activity: Not on file  Stress: Not on file  Social Connections: Not on file  Intimate Partner Violence: Not At Risk (02/09/2023)   Humiliation, Afraid, Rape, and Kick questionnaire    Fear of Current or Ex-Partner: No    Emotionally Abused: No    Physically Abused: No    Sexually Abused: No     PHYSICAL EXAM  There were no vitals filed for this visit.     There is no height or weight on file to calculate BMI.  Generalized: Well developed, in no acute distress  Cardiology: normal rate and  rhythm, no murmur auscultated  Respiratory: clear to auscultation bilaterally    Neurological examination  Mentation: Alert oriented to time, place, history taking. Follows all commands speech and language fluent Cranial nerve II-XII: Pupils were equal round reactive to light. Extraocular movements were full, visual field were full on confrontational test. Facial sensation and strength were normal. Head turning and shoulder shrug  were normal and symmetric. Motor: The motor testing reveals 5 over 5 strength of all 4 extremities. Good symmetric motor tone is  noted throughout.  Sensory: Sensory testing is intact to soft touch on all 4 extremities. No evidence of extinction is noted.  Coordination: Cerebellar testing reveals good finger-nose-finger and heel-to-shin bilaterally.  Gait and station: Gait is normal.  Reflexes: Deep tendon reflexes are symmetric and normal bilaterally.    DIAGNOSTIC DATA (LABS, IMAGING, TESTING) - I reviewed patient records, labs, notes, testing and imaging myself where available.  Lab Results  Component Value Date   WBC 12.2 (H) 02/11/2023   HGB 11.7 (L) 02/11/2023   HCT 35.9 (L) 02/11/2023   MCV 81.6 02/11/2023   PLT 125 (L) 02/11/2023      Component Value Date/Time   NA 134 (L) 02/11/2023 0351   NA 141 01/07/2023 0803   K 4.1 02/11/2023 0351   CL 100 02/11/2023 0351   CO2 25 02/11/2023 0351   GLUCOSE 162 (H) 02/11/2023 0351   BUN 14 02/11/2023 0351   BUN 12 01/07/2023 0803   CREATININE 1.00 02/11/2023 0351   CREATININE 0.78 05/02/2019 0918   CALCIUM  8.2 (L) 02/11/2023 0351   PROT 7.0 02/09/2023 0600   PROT 6.4 01/07/2023 0803   ALBUMIN  3.6 02/09/2023 0600   ALBUMIN  4.0 01/07/2023 0803   AST 21 02/09/2023 0600   ALT 25 02/09/2023 0600   ALKPHOS 73 02/09/2023 0600   BILITOT 0.6 02/09/2023 0600   BILITOT 0.2 01/07/2023 0803   GFRNONAA >60 02/11/2023 0351   GFRNONAA 98 09/02/2018 0725   GFRAA 115 10/20/2018 0954   GFRAA 114 09/02/2018 0725   Lab Results  Component Value Date   CHOL 98 06/08/2023   HDL 29 (L) 06/08/2023   LDLCALC 19 06/08/2023   TRIG 248 (H) 06/08/2023   CHOLHDL 3.4 06/08/2023   Lab Results  Component Value Date   HGBA1C 13.9 (A) 07/07/2023   Lab Results  Component Value Date   VITAMINB12 404 02/11/2016   Lab Results  Component Value Date   TSH 2.530 07/09/2022        No data to display               No data to display           ASSESSMENT AND PLAN  63 y.o. year old male  has a past medical history of Anal lesion, Bilateral carpal tunnel syndrome  (11/24/2018), Colon polyp, Coronary atherosclerosis of native coronary artery, Diabetic peripheral neuropathy (HCC) (11/24/2018), GERD (gastroesophageal reflux disease), Headache, Helicobacter pylori (H. pylori) (09/2009), Hiatal hernia, Hyperplastic colon polyp (2008), Mixed hyperlipidemia, Myocardial infarction (HCC) (06/2008), Seizures (HCC), Stroke (HCC), and Type 2 diabetes mellitus (HCC). here with    No diagnosis found.  Nicholas [name] is doing well, today. No seizure activity and headaches are significantly improved. No complex symptoms since April 2022. We will continue nortriptyline  40mg  daily at bedtime. He will continue Ubrelvy  as needed. Will avoid triptans due to stroke history. We have reviewed last A1C and stroke risk factors. He will follow up closely with endocrinology.  I have encouraged him  to continue focusing on water  intake. Healthy lifestyle habits encouraged. He will continue stroke prevention and co morbidity follow up with PCP. He may continue refills with PCP, if willing, and see me as needed. If not, he will return to see me in 1 year. He verbalizes understanding and agreement with this plan.    No orders of the defined types were placed in this encounter.    No orders of the defined types were placed in this encounter.      Greig Forbes, MSN, FNP-C 09/20/2023, 8:22 PM  [facility] [address] [place], [place] [address] [phone]

## 2023-09-20 NOTE — Patient Instructions (Incomplete)

## 2023-09-22 ENCOUNTER — Ambulatory Visit: Payer: BC Managed Care – PPO | Admitting: Family Medicine

## 2023-09-22 DIAGNOSIS — G43109 Migraine with aura, not intractable, without status migrainosus: Secondary | ICD-10-CM

## 2023-09-24 ENCOUNTER — Other Ambulatory Visit: Payer: Self-pay | Admitting: Cardiology

## 2023-10-01 ENCOUNTER — Ambulatory Visit (INDEPENDENT_AMBULATORY_CARE_PROVIDER_SITE_OTHER): Admitting: Podiatry

## 2023-10-01 VITALS — Ht 69.0 in | Wt 178.2 lb

## 2023-10-01 DIAGNOSIS — L03032 Cellulitis of left toe: Secondary | ICD-10-CM | POA: Diagnosis not present

## 2023-10-01 MED ORDER — MUPIROCIN 2 % EX OINT: 1.0000 | TOPICAL_OINTMENT | Freq: Every day | CUTANEOUS | 2 refills | Status: DC

## 2023-10-01 MED ORDER — CEPHALEXIN 500 MG PO CAPS: 500.0000 mg | ORAL_CAPSULE | Freq: Three times a day (TID) | ORAL | 0 refills | Status: AC

## 2023-10-01 NOTE — Progress Notes (Signed)
  Subjective:  Patient ID: Nicholas [name], male    DOB: 1960/10/02,  MRN: [id number]  Chief Complaint  Patient presents with   Ingrown Toenail    Rm 6 Patient is here for ingrown toe nail of the left hallux. Left hallux is red and swollen on medial side since Monday.    63 y.o. male presents with the above complaint. History confirmed with patient.  Due for A1c check next week his last 1 was elevated at 13%  Objective:  Physical Exam: warm, good capillary refill, no trophic changes or ulcerative lesions, normal DP and PT pulses, normal sensory exam, and ingrown left hallux with medial paronychia.  Assessment:   1. Paronychia of great toe of left foot      Plan:  Patient was evaluated and treated and all questions answered.    Ingrown Nail, left -Patient elects to proceed with minor surgery to remove ingrown toenail today. Consent reviewed and signed by patient. -Ingrown nail excised. See procedure note. -Educated on post-procedure care including soaking. Written instructions provided and reviewed. -Rx for Keflex  and mupirocin  sent to pharmacy.   Procedure: Excision of Ingrown Toenail Location: Left 1st toe medial nail borders. Anesthesia: Lidocaine  1% plain; 1.5 mL and Marcaine  0.5% plain; 1.5 mL, digital block. Skin Prep: Betadine. Dressing: Silvadene; telfa; dry, sterile, compression dressing. Technique: Following skin prep, the toe was exsanguinated and a tourniquet was secured at the base of the toe. The affected nail border was freed, split with a nail splitter, and excised.  The nailbed was irrigated out with alcohol. The tourniquet was then removed and sterile dressing applied. Disposition: Patient tolerated procedure well.    Return in about 2 weeks (around 10/15/2023) for nail re-check.

## 2023-10-01 NOTE — Patient Instructions (Signed)
 Soak Instructions    THE DAY AFTER THE PROCEDURE  Place 1/4 cup of epsom salts (or betadine, or white vinegar) in a quart of warm tap water .  Submerge your foot or feet with outer bandage intact for the initial soak; this will allow the bandage to become moist and wet for easy lift off.  Once you remove your bandage, continue to soak in the solution for 20 minutes.  This soak should be done twice a day.  Next, remove your foot or feet from solution, blot dry the affected area and apply a small amount of the mupirocin  ointment.  Then cover with a regular Band-Aid.  Do this for at least 2 weeks.  Longer if you are still having drainage redness or irritation  IF YOUR SKIN BECOMES IRRITATED WHILE USING THESE INSTRUCTIONS, IT IS OKAY TO SWITCH TO  WHITE VINEGAR AND WATER . Or you may use antibacterial soap and water  to keep the toe clean  Monitor for any signs/symptoms of infection. Call the office immediately if any occur or go directly to the emergency room. Call with any questions/concerns.

## 2023-10-13 ENCOUNTER — Ambulatory Visit: Admitting: "Endocrinology

## 2023-10-14 ENCOUNTER — Encounter: Payer: Self-pay | Admitting: "Endocrinology

## 2023-10-14 ENCOUNTER — Ambulatory Visit (INDEPENDENT_AMBULATORY_CARE_PROVIDER_SITE_OTHER): Admitting: "Endocrinology

## 2023-10-14 VITALS — BP 128/76 | HR 76 | Ht 69.0 in | Wt 181.0 lb

## 2023-10-14 DIAGNOSIS — I10 Essential (primary) hypertension: Secondary | ICD-10-CM | POA: Diagnosis not present

## 2023-10-14 DIAGNOSIS — E782 Mixed hyperlipidemia: Secondary | ICD-10-CM

## 2023-10-14 DIAGNOSIS — Z794 Long term (current) use of insulin: Secondary | ICD-10-CM

## 2023-10-14 DIAGNOSIS — E1159 Type 2 diabetes mellitus with other circulatory complications: Secondary | ICD-10-CM

## 2023-10-14 DIAGNOSIS — E559 Vitamin D deficiency, unspecified: Secondary | ICD-10-CM

## 2023-10-14 LAB — POCT GLYCOSYLATED HEMOGLOBIN (HGB A1C): HbA1c, POC (controlled diabetic range): 12.7 % — AB (ref 0.0–7.0)

## 2023-10-14 MED ORDER — EMPAGLIFLOZIN 10 MG PO TABS
10.0000 mg | ORAL_TABLET | Freq: Every day | ORAL | 1 refills | Status: DC
Start: 2023-10-14 — End: 2023-12-10

## 2023-10-14 MED ORDER — INSULIN LISPRO PROT & LISPRO (75-25 MIX) 100 UNIT/ML KWIKPEN
50.0000 [IU] | PEN_INJECTOR | Freq: Two times a day (BID) | SUBCUTANEOUS | 1 refills | Status: DC
Start: 2023-10-14 — End: 2023-12-22

## 2023-10-14 NOTE — Progress Notes (Signed)
 10/14/2023  Endocrinology follow-up note  Subjective:    Patient ID: Nicholas [name], male    DOB: August 16, 1960,    Past Medical History:  Diagnosis Date   Anal lesion    Anal papilla/tags - external lesion   Bilateral carpal tunnel syndrome 11/24/2018   Colon polyp    Hyperplastic rectosigmoid polyp 9/08   Coronary atherosclerosis of native coronary artery    DES to PLB, BMS to mid and distal RCA, DES to circumflex and distal LAD, 6/10 (5 stents)   Diabetic peripheral neuropathy (HCC) 11/24/2018   GERD (gastroesophageal reflux disease)    Headache    Migraines   Helicobacter pylori (H. pylori) 09/2009   Treated with helidac   Hiatal hernia    Pt denies   Hyperplastic colon polyp 2008   Mixed hyperlipidemia    Myocardial infarction (HCC) 06/2008   Seizures (HCC)    Related to stroke in 2018. None since then as of 01/2023   Stroke [facility])    January 2018   Type 2 diabetes mellitus [facility])    Past Surgical History:  Procedure Laterality Date   CHOLECYSTECTOMY     COLLAPSED LUNG     15 years ago   COLONOSCOPY  10/12/2006   Dr. Shaaron- rectosigmoid polyp s/phot snare polypectomy o/w normal rectum and terminal ileum. hyperplastic polyp on bx   COLONOSCOPY WITH PROPOFOL  N/A 09/01/2019   Procedure: COLONOSCOPY WITH PROPOFOL ;  Surgeon: Shaaron Lamar HERO, MD;  Location: [facility] ENDO SUITE;  Service: Endoscopy;  Laterality: N/A;  8:30am   CORONARY ARTERY BYPASS GRAFT N/A 02/09/2023   Procedure: CORONARY ARTERY BYPASS GRAFTING X 5, USING LEFT INTERNAL MAMMARY ARTERY AND ENDOSCOPICALLY HARVESTED LEFT SAPHENOUS VEIN GRAFT;  Surgeon: Lucas Dorise POUR, MD;  Location: [facility] OR;  Service: Open Heart Surgery;  Laterality: N/A;   EP IMPLANTABLE DEVICE N/A 02/12/2016   Procedure: Loop Recorder Insertion;  Surgeon: Lynwood Rakers, MD;  Location: [facility] INVASIVE CV LAB;  Service: Cardiovascular;  Laterality: N/A;   ESOPHAGOGASTRODUODENOSCOPY  10/12/2006   Dr. Shaaron- examination of the tubular esophagus revealed no  mucosal abnormalities. the EG junction was easily traversed. small hiatal hernia, the gastric mucosa o/w appeared normal. there was no infiltrating process or frank ulcer seen.   implantable loop recorder removal  09/29/2019   MDT Reveal LINQ removed in office by Dr Rakers   LEFT HEART CATH AND CORONARY ANGIOGRAPHY N/A 01/13/2023   Procedure: LEFT HEART CATH AND CORONARY ANGIOGRAPHY;  Surgeon: Wonda Sharper, MD;  Location: [facility] INVASIVE CV LAB;  Service: Cardiovascular;  Laterality: N/A;   TEE WITHOUT CARDIOVERSION N/A 02/12/2016   Procedure: TRANSESOPHAGEAL ECHOCARDIOGRAM (TEE);  Surgeon: Oneil JAYSON Parchment, MD;  Location: [facility] ENDOSCOPY;  Service: Cardiovascular;  Laterality: N/A;   TEE WITHOUT CARDIOVERSION N/A 02/09/2023   Procedure: TRANSESOPHAGEAL ECHOCARDIOGRAM (TEE);  Surgeon: Lucas Dorise POUR, MD;  Location: [facility] OR;  Service: Open Heart Surgery;  Laterality: N/A;   WIRE IN APEX OF RIGHT LUNG     Since childhood   Social History   Socioeconomic History   Marital status: Married    Spouse name: Not on file   Number of children: Not on file   Years of education: Not on file   Highest education level: Not on file  Occupational History   Occupation: maintenance, parking lots    Employer: SELF-EMPLOYED  Tobacco Use   Smoking status: Former    Current packs/day: 0.50    Average packs/day: 0.5 packs/day for 50.5 years (25.3 ttl pk-yrs)  Types: Cigarettes    Start date: 04/08/1973   Smokeless tobacco: Never  Vaping Use   Vaping status: Never Used  Substance and Sexual Activity   Alcohol use: No    Alcohol/week: 0.0 standard drinks of alcohol   Drug use: No   Sexual activity: Yes    Partners: Female    Birth control/protection: Pill  Other Topics Concern   Not on file  Social History Narrative   Drinks Coffee 2 cups daily.  Lives sat home with wife.   Is self employed.  Graduated from High school.     Social Drivers of Corporate investment banker Strain: Not on file  Food  Insecurity: No Food Insecurity (02/09/2023)   Hunger Vital Sign    Worried About Running Out of Food in the Last Year: Never true    Ran Out of Food in the Last Year: Never true  Transportation Needs: No Transportation Needs (02/09/2023)   PRAPARE - Administrator, Civil Service (Medical): No    Lack of Transportation (Non-Medical): No  Physical Activity: Not on file  Stress: Not on file  Social Connections: Not on file   Outpatient Encounter Medications as of 10/14/2023  Medication Sig   empagliflozin  (JARDIANCE ) 10 MG TABS tablet Take 1 tablet (10 mg total) by mouth daily before breakfast.   [DISCONTINUED] Insulin  Lispro Prot & Lispro (HUMALOG  MIX 75/25 KWIKPEN) (75-25) 100 UNIT/ML Kwikpen Inject 40 Units into the skin 2 (two) times daily with a meal. (Patient taking differently: Inject 40-60 Units into the skin 2 (two) times daily with a meal.)   acetaminophen  (TYLENOL ) 500 MG tablet Take 500 mg by mouth every 6 (six) hours as needed (for headaches).   albuterol  (VENTOLIN  HFA) 108 (90 Base) MCG/ACT inhaler Inhale 1-2 puffs into the lungs every 6 (six) hours as needed for wheezing or shortness of breath.   aspirin  EC 325 MG tablet Take 1 tablet (325 mg total) by mouth daily.   BD PEN NEEDLE NANO U/F 32G X 4 MM MISC USE AS DIRECTED FOUR TIMES DAILY.   cephALEXin  (KEFLEX ) 500 MG capsule Take 1 capsule (500 mg total) by mouth 3 (three) times daily for 14 days.   Coenzyme Q10 (COQ10) 100 MG CAPS Take 100 mg by mouth daily.   Continuous Glucose Receiver (FREESTYLE LIBRE 3 READER) DEVI 1 Piece by Does not apply route once as needed for up to 1 dose.   Continuous Glucose Sensor (FREESTYLE LIBRE 3 PLUS SENSOR) MISC USE AS DIRECTED TO MONITOR GLUCOSE CONTINUOUSLY AS DIRECTED. CHANGE SENSOR EVERY 15 DAYS.   gabapentin  (NEURONTIN ) 100 MG capsule Take 100 mg by mouth daily.    Insulin  Lispro Prot & Lispro (HUMALOG  MIX 75/25 KWIKPEN) (75-25) 100 UNIT/ML Kwikpen Inject 50 Units into the skin 2  (two) times daily with a meal.   metoprolol  tartrate (LOPRESSOR ) 50 MG tablet Take 1 tablet (50 mg total) by mouth 2 (two) times daily.   mupirocin  ointment (BACTROBAN ) 2 % Apply 1 Application topically daily.   nicotine  (NICODERM CQ  - DOSED IN MG/24 HR) 7 mg/24hr patch Place 1 patch (7 mg total) onto the skin daily.   nitroGLYCERIN  (NITROSTAT ) 0.4 MG SL tablet Place 1 tablet (0.4 mg total) under the tongue every 5 (five) minutes as needed for chest pain.   nortriptyline  (PAMELOR ) 10 MG capsule Take 4 capsules (40 mg total) by mouth at bedtime.   pantoprazole  (PROTONIX ) 40 MG tablet Take 40 mg by mouth daily before breakfast.  rosuvastatin  (CRESTOR ) 40 MG tablet Take 40 mg by mouth at bedtime.   Ubrogepant  (UBRELVY ) 100 MG TABS Take 1 tablet (100 mg total) by mouth daily as needed. Take one tablet at onset of headache, may repeat 1 tablet in 2 hours, no more than 2 tablets in 24 hours   [DISCONTINUED] Semaglutide  (RYBELSUS ) 3 MG TABS Take 1 tablet (3 mg total) by mouth daily before breakfast. (Patient not taking: Reported on 10/14/2023)   No facility-administered encounter medications on file as of 10/14/2023.   ALLERGIES: Allergies  Allergen Reactions   Keppra  [Levetiracetam ]     Hallucination   Topamax  [Topiramate ]     irritable   VACCINATION STATUS:  There is no immunization history on file for this patient.  Diabetes He presents for his follow-up diabetic visit. He has type 2 diabetes mellitus. Onset time: He was diagnosed at approximate age of 49 years. In this particular patient with a history of heavy alcohol use possiblity of pancreatic diabetes is high. His disease course has been worsening. Pertinent negatives for hypoglycemia include no confusion, headaches, pallor, seizures or speech difficulty. Pertinent negatives for diabetes include no chest pain, no fatigue, no polydipsia, no polyphagia, no polyuria and no weakness. There are no hypoglycemic complications. Symptoms are  worsening. Diabetic complications include a CVA and heart disease. (Patient is alarmingly noncompliant did not follow insulin  med regimen recommended, did not utilize his CGM glucose readings.) Risk factors for coronary artery disease include diabetes mellitus, dyslipidemia, hypertension, male sex, sedentary lifestyle and tobacco exposure. Current diabetic treatment includes oral agent (dual therapy) and insulin  injections. He is compliant with treatment some of the time. His weight is fluctuating minimally. He is following a generally unhealthy diet. When asked about meal planning, he reported none. He has not had a previous visit with a dietitian (he did not keep appointment.). He rarely participates in exercise. His home blood glucose trend is increasing steadily. His breakfast blood glucose range is generally >200 mg/dl. His lunch blood glucose range is generally >200 mg/dl. His dinner blood glucose range is generally >200 mg/dl. His bedtime blood glucose range is generally >200 mg/dl. His overall blood glucose range is >200 mg/dl. (He presents with his CGM device showing significant hyperglycemic burden with 0% time in range, 8% level 1 hyperglycemia and 92% severe hyperglycemia.  His average blood glucose is 341 mg per DL for the most recent 2 weeks, point-of-care A1c 4.7%.  His A1c was 13.9% during his last visit.  Admittedly, he has not been utilizing his premixed insulin  as ordered.  He does not want to bring his blood glucose below 200 mg per DL.  He did not go pick up his Rybelsus .) Eye exam is current.  Hyperlipidemia This is a chronic problem. The current episode started more than 1 year ago. The problem is uncontrolled (He was convinced to take Crestor  during his last visit, returns with significant improvement in his LDL from 135 down to 87, triglycerides from 396-215.). Exacerbating diseases include diabetes. Pertinent negatives include no chest pain, myalgias or shortness of breath. Current  antihyperlipidemic treatment includes statins. Risk factors for coronary artery disease include dyslipidemia, diabetes mellitus, hypertension and a sedentary lifestyle.  Hypertension This is a chronic problem. The current episode started more than 1 year ago. The problem is controlled. Pertinent negatives include no chest pain, headaches, neck pain, palpitations or shortness of breath. Risk factors for coronary artery disease include diabetes mellitus, dyslipidemia and smoking/tobacco exposure. Past treatments include beta blockers. Hypertensive end-organ  damage includes CAD/MI and CVA.     Objective:    BP 128/76   Pulse 76   Ht 5' 9 (1.753 m)   Wt 181 lb (82.1 kg)   BMI 26.73 kg/m   Wt Readings from Last 3 Encounters:  10/14/23 181 lb (82.1 kg)  10/01/23 178 lb 3.2 oz (80.8 kg)  07/07/23 178 lb 3.2 oz (80.8 kg)       Diabetic Labs (most recent): Lab Results  Component Value Date   HGBA1C 12.7 (A) 10/14/2023   HGBA1C 13.9 (A) 07/07/2023   HGBA1C 11.0 (H) 02/09/2023   Lipid Panel     Component Value Date/Time   CHOL 98 06/08/2023 0904   CHOL 167 01/07/2023 0803   TRIG 248 (H) 06/08/2023 0904   HDL 29 (L) 06/08/2023 0904   HDL 31 (L) 01/07/2023 0803   CHOLHDL 3.4 06/08/2023 0904   VLDL 50 (H) 06/08/2023 0904   LDLCALC 19 06/08/2023 0904   LDLCALC 92 01/07/2023 0803   LDLCALC 139 (H) 08/30/2020 0741      Latest Ref Rng & Units 02/11/2023    3:51 AM 02/10/2023    4:01 PM 02/10/2023    3:53 AM  CMP  Glucose 70 - 99 mg/dL 837  784  857   BUN 8 - 23 mg/dL 14  14  9    Creatinine 0.61 - 1.24 mg/dL 8.99  8.87  9.18   Sodium 135 - 145 mmol/L 134  134  136   Potassium 3.5 - 5.1 mmol/L 4.1  4.5  4.5   Chloride 98 - 111 mmol/L 100  100  107   CO2 22 - 32 mmol/L 25  24  24    Calcium  8.9 - 10.3 mg/dL 8.2  8.2  8.1      Assessment & Plan:   1. Type 2 diabetes mellitus with vascular disease (HCC) -His diabetes care is  complicated by history of alarming  noncompliance/nonadherence, coronary artery disease, underwent CABG x 5 in January 2025 and patient remains at a high risk for more acute and chronic complications of diabetes which include CAD, CVA, CKD, retinopathy, and neuropathy. These are all discussed in detail with the patient.  He presents with his CGM device showing significant hyperglycemic burden with 0% time in range, 8% level 1 hyperglycemia and 92% severe hyperglycemia.  His average blood glucose is 341 mg per DL for the most recent 2 weeks, point-of-care A1c 4.7%.  His A1c was 13.9% during his last visit.  Admittedly, he has not been utilizing his premixed insulin  as ordered.  He does not want to bring his blood glucose below 200 mg per DL.  He did not go pick up his Rybelsus .   - Suggestion is made for him to avoid simple carbohydrates  from his diet including Cakes, Sweet Desserts, Ice Cream, Soda (diet and regular), Sweet Tea, Candies, Chips, Cookies, Store Bought Juices, Alcohol in Excess of  1-2 drinks a day, Artificial Sweeteners,  Coffee Creamer, and Sugar-free Products, Lemonade. This will help patient to have more stable blood glucose profile and potentially avoid unintended weight gain.  - Patient is advised to stick to a routine mealtimes to eat 3 meals  a day and avoid unnecessary snacks ( to snack only to correct hypoglycemia).  - I have approached patient with the following individualized plan to manage diabetes and patient agrees.  -Unfortunately, Mr. Po has not engaged in self-care optimally. He did not comply with basal/bolus insulin  previously tried.  He  has not done given that premixed insulin  which was offered to him for simplicity reasons.   - For unclear reasons, he has not filled the Rybelsus  prescription given to him during his last visit.  I discussed and ordered Jardiance  10 mg p.o. daily at breakfast.  he may benefit from this medication from renal and cardiovascular point of view.  Side effects and  precautions discussed with him.   -As usual, he promises to do better with his insulin .  I advised him to restart and continue NovoLog  75/25 at 50 units with breakfast and 50 units with supper when Premeal blood glucose readings are above 90 mg per DL.    -He is advised to continue monitoring blood glucose continuously using his CGM.  -He is warned not to inject insulin  without proper monitoring of blood glucose.  -Target numbers for A1c, LDL, HDL, Triglycerides,  were discussed in detail.   2) BP/HTN:  His blood pressure is controlled to target.  He is advised to continue his current medications including beta blockers.  He has marginal blood pressure, he will not tolerate additional ACE inhibitors.  3) Lipids/HPL: His recent labs show dramatic response to Crestor  with LDL at 19 from 92.   He is advised to continue Crestor  40 mg p.o. nightly.   Side effects and precautions discussed with him.  4)  Weight/Diet: His BMI is 26.73-  He is not a candidate for major weight loss.  He is  following with  CDE consult, exercise, and carbohydrates information provided.    5) Chronic Care/Health Maintenance:  -Patient is  Statin medications and encouraged to continue to follow up with Ophthalmology, Podiatrist at least yearly or according to recommendations, and advised to stay away from Smoking .  He previously quit smoking after a CVA, however resumed subsequently.  I have recommended yearly flu vaccine and pneumonia vaccination at least every 5 years; moderate intensity exercise for up to 150 minutes weekly; and  sleep for at least 7 hours a day.    He recently had normal ABI, will be repeated in November 2026,  or sooner if needed. I advised patient to maintain close follow up with his PCP for primary care needs.  I spent  26  minutes in the care of the patient today including review of labs from CMP, Lipids, Thyroid  Function, Hematology (current and previous including abstractions from other  facilities); face-to-face time discussing  his blood glucose readings/logs, discussing hypoglycemia and hyperglycemia episodes and symptoms, medications doses, his options of short and long term treatment based on the latest standards of care / guidelines;  discussion about incorporating lifestyle medicine;  and documenting the encounter. Risk reduction counseling performed per USPSTF guidelines to reduce cardiovascular risk factors.     Please refer to Patient Instructions for Blood Glucose Monitoring and Insulin /Medications Dosing Guide  in media tab for additional information. Please  also refer to  Patient Self Inventory in the Media  tab for reviewed elements of pertinent patient history.  Nicholas [name] participated in the discussions, expressed understanding, and voiced agreement with the above plans.  All questions were answered to his satisfaction. he is encouraged to contact clinic should he have any questions or concerns prior to his return visit.  Follow up plan: Return in about 3 months (around 01/13/2024) for F/U with Pre-visit Labs, Meter/CGM/Logs, A1c here.  Ranny Earl, MD Phone: [phone]  Fax: [phone]  This note was partially dictated with voice recognition software. Similar sounding words can be  transcribed inadequately or may not  be corrected upon review.  10/14/2023, 9:46 AM

## 2023-10-14 NOTE — Patient Instructions (Signed)

## 2023-10-15 ENCOUNTER — Ambulatory Visit: Admitting: Podiatry

## 2023-10-27 ENCOUNTER — Inpatient Hospital Stay: Admission: RE | Admit: 2023-10-27 | Source: Ambulatory Visit

## 2023-12-10 ENCOUNTER — Other Ambulatory Visit: Payer: Self-pay | Admitting: "Endocrinology

## 2023-12-19 ENCOUNTER — Other Ambulatory Visit: Payer: Self-pay | Admitting: "Endocrinology

## 2023-12-19 DIAGNOSIS — E1159 Type 2 diabetes mellitus with other circulatory complications: Secondary | ICD-10-CM

## 2023-12-22 ENCOUNTER — Other Ambulatory Visit: Payer: Self-pay

## 2023-12-22 DIAGNOSIS — E1159 Type 2 diabetes mellitus with other circulatory complications: Secondary | ICD-10-CM

## 2023-12-22 MED ORDER — INSULIN LISPRO PROT & LISPRO (75-25 MIX) 100 UNIT/ML KWIKPEN: 50.0000 [IU] | PEN_INJECTOR | Freq: Two times a day (BID) | SUBCUTANEOUS | 0 refills | Status: DC

## 2023-12-25 ENCOUNTER — Ambulatory Visit
Admission: RE | Admit: 2023-12-25 | Discharge: 2023-12-25 | Disposition: A | Discharge status: Home / Self Care | Attending: Family Medicine

## 2023-12-25 ENCOUNTER — Ambulatory Visit

## 2023-12-25 VITALS — BP 128/80 | HR 99 | Temp 98.1°F | Resp 18

## 2023-12-25 DIAGNOSIS — R0789 Other chest pain: Secondary | ICD-10-CM

## 2023-12-25 DIAGNOSIS — S2232XA Fracture of one rib, left side, initial encounter for closed fracture: Secondary | ICD-10-CM

## 2023-12-25 DIAGNOSIS — W19XXXA Unspecified fall, initial encounter: Secondary | ICD-10-CM

## 2023-12-25 MED ORDER — TIZANIDINE HCL 4 MG PO TABS: 4.0000 mg | ORAL_TABLET | Freq: Three times a day (TID) | ORAL | 0 refills | Status: DC | PRN

## 2023-12-25 MED ORDER — LIDOCAINE 5 % EX PTCH: 1.0000 | MEDICATED_PATCH | CUTANEOUS | 0 refills | Status: DC

## 2023-12-25 NOTE — ED Triage Notes (Signed)
 Fell 1 week ago hitting left side.  States coughed yesterday and felt a pop at his left rib area.

## 2023-12-25 NOTE — Discharge Instructions (Signed)
 We will give you a call if anything comes back abnormal on your x-ray.  I have sent over some lidocaine  patches and muscle relaxer to help with your pain and you may do over-the-counter pain relievers, heat, rest.  Follow-up for worsening or unresolving symptoms.

## 2023-12-25 NOTE — ED Provider Notes (Signed)
 [facility]    CSN: [id number] Arrival date & time: 12/25/23  1120      History   Chief Complaint No chief complaint on file.   HPI Nicholas [name] is a 63 y.o. male.   Patient presenting today with 1 week history of left lateral rib pain following a fall when he got tripped up by his dog landing on the stair banister to this area.  He states he was feeling fairly well until yesterday he had a hard cough and felt a pop in the left rib area.  Denies bruising, swelling, discoloration, shortness of breath, chest tightness, dizziness.  Did not hit his head or lose consciousness during fall.  So far trying over-the-counter remedies with minimal relief.    Past Medical History:  Diagnosis Date   Anal lesion    Anal papilla/tags - external lesion   Bilateral carpal tunnel syndrome 11/24/2018   Colon polyp    Hyperplastic rectosigmoid polyp 9/08   Coronary atherosclerosis of native coronary artery    DES to PLB, BMS to mid and distal RCA, DES to circumflex and distal LAD, 6/10 (5 stents)   Diabetic peripheral neuropathy (HCC) 11/24/2018   GERD (gastroesophageal reflux disease)    Headache    Migraines   Helicobacter pylori (H. pylori) 09/2009   Treated with helidac   Hiatal hernia    Pt denies   Hyperplastic colon polyp 2008   Mixed hyperlipidemia    Myocardial infarction (HCC) 06/2008   Seizures (HCC)    Related to stroke in 2018. 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   Insulin  long-term use (HCC) 10/24/2022   Acute metabolic encephalopathy 05/21/2020   Receptive aphasia    Subdural hematoma (HCC) 05/09/2020   Polypharmacy 06/17/2019   Encounter for screening colonoscopy 06/17/2019   Vitamin D  deficiency 05/10/2019   Diabetic peripheral neuropathy (HCC) 11/24/2018   Bilateral carpal tunnel syndrome 11/24/2018   Acute  ischemic stroke (HCC) 03/03/2018   Uncontrolled type 2 diabetes mellitus with hyperglycemia (HCC) 04/21/2017   Hyperlipemia 04/21/2016   Malnutrition of moderate degree 02/11/2016   History of stroke 02/11/2016   TIA (transient ischemic attack) 02/09/2016   Right carotid bruit 02/09/2016   Non-adherence to medical treatment 02/09/2016   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 Shaaron- rectosigmoid polyp s/phot snare polypectomy o/w normal rectum and terminal ileum. hyperplastic polyp on bx   COLONOSCOPY WITH PROPOFOL  N/A 09/01/2019   Procedure: COLONOSCOPY WITH PROPOFOL ;  Surgeon: Shaaron Lamar HERO, MD;  Location: [facility];  Service: Endoscopy;  Laterality: N/A;  8:30am   CORONARY ARTERY BYPASS GRAFT N/A 02/09/2023   Procedure: CORONARY ARTERY BYPASS GRAFTING X 5, USING LEFT INTERNAL MAMMARY ARTERY AND ENDOSCOPICALLY HARVESTED LEFT SAPHENOUS VEIN GRAFT;  Surgeon: Lucas Dorise POUR, MD;  Location: [facility] OR;  Service: Open Heart Surgery;  Laterality: N/A;   EP IMPLANTABLE  DEVICE N/A 02/12/2016   Procedure: Loop Recorder Insertion;  Surgeon: Lynwood Rakers, MD;  Location: [facility] INVASIVE CV LAB;  Service: Cardiovascular;  Laterality: N/A;   ESOPHAGOGASTRODUODENOSCOPY  10/12/2006   Dr. Shaaron- examination of the tubular esophagus revealed no mucosal abnormalities. the EG junction was easily traversed. small hiatal hernia, the gastric mucosa o/w appeared normal. there was no infiltrating process or frank ulcer seen.   implantable loop recorder removal  09/29/2019   MDT Reveal LINQ removed in office by Dr Rakers    LEFT HEART CATH AND CORONARY ANGIOGRAPHY N/A 01/13/2023   Procedure: LEFT HEART CATH AND CORONARY ANGIOGRAPHY;  Surgeon: Wonda Sharper, MD;  Location: [facility] INVASIVE CV LAB;  Service: Cardiovascular;  Laterality: N/A;   TEE WITHOUT CARDIOVERSION N/A 02/12/2016   Procedure: TRANSESOPHAGEAL ECHOCARDIOGRAM (TEE);  Surgeon: Oneil JAYSON Parchment, MD;  Location: [facility] ENDOSCOPY;  Service: Cardiovascular;  Laterality: N/A;   TEE WITHOUT CARDIOVERSION N/A 02/09/2023   Procedure: TRANSESOPHAGEAL ECHOCARDIOGRAM (TEE);  Surgeon: Lucas Dorise POUR, MD;  Location: [facility] OR;  Service: Open Heart Surgery;  Laterality: N/A;   WIRE IN APEX OF RIGHT LUNG     Since childhood       Home Medications    Prior to Admission medications   Medication Sig Start Date End Date Taking? Authorizing Provider  lidocaine  (LIDODERM ) 5 % Place 1 patch onto the skin daily. Remove & Discard patch within 12 hours or as directed by MD 12/25/23  Yes Stuart Vernell Norris, PA-C  tiZANidine  (ZANAFLEX ) 4 MG tablet Take 1 tablet (4 mg total) by mouth every 8 (eight) hours as needed for muscle spasms. Do not drink alcohol or drive while taking this medication.  May cause drowsiness. 12/25/23  Yes Stuart Vernell Norris, PA-C  acetaminophen  (TYLENOL ) 500 MG tablet Take 500 mg by mouth every 6 (six) hours as needed (for headaches).    [provider]  albuterol  (VENTOLIN  HFA) 108 (90 Base) MCG/ACT inhaler Inhale 1-2 puffs into the lungs every 6 (six) hours as needed for wheezing or shortness of breath.    [provider]  aspirin  EC 325 MG tablet Take 1 tablet (325 mg total) by mouth daily. 02/14/23   Roddenberry, Myron G, PA-C  BD PEN NEEDLE NANO U/F 32G X 4 MM MISC USE AS DIRECTED FOUR TIMES DAILY. 08/31/17   Nida, Gebreselassie W, MD  Coenzyme Q10 (COQ10) 100 MG CAPS Take 100 mg by mouth daily.    [provider]  Continuous Glucose Receiver (FREESTYLE LIBRE 3 READER) DEVI 1 Piece by Does not apply route once as needed for up  to 1 dose. 07/11/22   Nida, Gebreselassie W, MD  Continuous Glucose Sensor (FREESTYLE LIBRE 3 PLUS SENSOR) MISC USE AS DIRECTED TO MONITOR GLUCOSE CONTINUOUSLY AS DIRECTED, CHANGE SENSOR EVERY 15 DAYS. 12/22/23   Nida, Gebreselassie W, MD  empagliflozin  (JARDIANCE ) 10 MG TABS tablet TAKE 1 TABLET BY MOUTH ONCE DAILY BEFORE BREAKFAST 12/10/23   Nida, Gebreselassie W, MD  gabapentin  (NEURONTIN ) 100 MG capsule Take 100 mg by mouth daily.  01/29/19   [provider]  Insulin  Lispro Prot & Lispro (HUMALOG  MIX 75/25 KWIKPEN) (75-25) 100 UNIT/ML Kwikpen Inject 50 Units into the skin 2 (two) times daily with a meal. 12/22/23   Nida, Gebreselassie W, MD  metoprolol  tartrate (LOPRESSOR ) 50 MG tablet Take 1 tablet (50 mg total) by mouth 2 (two) times daily. 06/03/23   Debera Jayson MATSU, MD  mupirocin  ointment (BACTROBAN ) 2 % Apply 1 Application topically daily. 10/01/23  McDonald, Adam R, DPM  nicotine  (NICODERM CQ  - DOSED IN MG/24 HR) 7 mg/24hr patch Place 1 patch (7 mg total) onto the skin daily. 02/14/23   Roddenberry, Myron G, PA-C  nitroGLYCERIN  (NITROSTAT ) 0.4 MG SL tablet Place 1 tablet (0.4 mg total) under the tongue every 5 (five) minutes as needed for chest pain. 09/24/23   Debera Jayson MATSU, MD  nortriptyline  (PAMELOR ) 10 MG capsule Take 4 capsules (40 mg total) by mouth at bedtime. 12/02/22   Dohmeier, Dedra, MD  pantoprazole  (PROTONIX ) 40 MG tablet Take 40 mg by mouth daily before breakfast. 04/11/10   [provider]  rosuvastatin  (CRESTOR ) 40 MG tablet Take 40 mg by mouth at bedtime.    [provider]  Ubrogepant  (UBRELVY ) 100 MG TABS Take 1 tablet (100 mg total) by mouth daily as needed. Take one tablet at onset of headache, may repeat 1 tablet in 2 hours, no more than 2 tablets in 24 hours 09/17/22   Lomax, Amy, NP    Family History Family History  Problem Relation Age of Onset   Diabetes Mother    Heart disease Father    Cancer Brother    CVA Neg Hx     Social  History Social History   Tobacco Use   Smoking status: Former    Current packs/day: 0.50    Average packs/day: 0.5 packs/day for 50.7 years (25.4 ttl pk-yrs)    Types: Cigarettes    Start date: 04/08/1973   Smokeless tobacco: Never  Vaping Use   Vaping status: Never Used  Substance Use Topics   Alcohol use: No    Alcohol/week: 0.0 standard drinks of alcohol   Drug use: No     Allergies   Keppra  [levetiracetam ] and Topamax  [topiramate ]   Review of Systems Review of Systems PER HPI  Physical Exam Triage Vital Signs ED Triage Vitals  Encounter Vitals Group     BP 12/25/23 1126 128/80     Girls Systolic BP Percentile --      Girls Diastolic BP Percentile --      Boys Systolic BP Percentile --      Boys Diastolic BP Percentile --      Pulse Rate 12/25/23 1126 99     Resp 12/25/23 1126 18     Temp 12/25/23 1126 98.1 F (36.7 C)     Temp Source 12/25/23 1126 Oral     SpO2 12/25/23 1126 93 %     Weight --      Height --      Head Circumference --      Peak Flow --      Pain Score 12/25/23 1127 5     Pain Loc --      Pain Education --      Exclude from Growth Chart --    No data found.  Updated Vital Signs BP 128/80 (BP Location: Right Arm)   Pulse 99   Temp 98.1 F (36.7 C) (Oral)   Resp 18   SpO2 93%   Visual Acuity Right Eye Distance:   Left Eye Distance:   Bilateral Distance:    Right Eye Near:   Left Eye Near:    Bilateral Near:     Physical Exam Vitals and nursing note reviewed.  Constitutional:      Appearance: Normal appearance.  HENT:     Head: Atraumatic.  Eyes:     Extraocular Movements: Extraocular movements intact.     Conjunctiva/sclera: Conjunctivae normal.  Cardiovascular:  Rate and Rhythm: Normal rate and regular rhythm.  Pulmonary:     Effort: Pulmonary effort is normal.     Breath sounds: Normal breath sounds.     Comments: Chest rise symmetric bilaterally. Musculoskeletal:        General: Tenderness and signs of injury  present. Normal range of motion.     Cervical back: Normal range of motion and neck supple.     Comments: Localized area of tenderness to palpation to the left lateral lower rib region.  No bony deformity palpable, no discoloration  Skin:    General: Skin is warm and dry.  Neurological:     Mental Status: He is oriented to person, place, and time.  Psychiatric:        Mood and Affect: Mood normal.        Thought Content: Thought content normal.        Judgment: Judgment normal.      UC Treatments / Results  Labs (all labs ordered are listed, but only abnormal results are displayed) Labs Reviewed - No data to display  EKG   Radiology DG Ribs Unilateral W/Chest Left Result Date: 12/25/2023 EXAM: 1 VIEW(S) XRAY OF THE LEFT RIBS AND CHEST 12/25/2023 11:38:59 AM COMPARISON: 03/18/2023 CLINICAL HISTORY: coughed yesterday and felt a pop on left side os chest area. history of fall on Monday FINDINGS: BONES: Mildly displaced left lateral 7th rib fracture. Intact sternotomy wires. LUNGS AND PLEURA: Right apical postsurgical sequela. No consolidation or pulmonary edema. No pleural effusion or pneumothorax. HEART AND MEDIASTINUM: Cholecystectomy clips noted. No acute abnormality of the cardiac and mediastinal silhouettes. IMPRESSION: 1. Mildly displaced left lateral 7th rib fracture. Electronically signed by: Donnice Mania MD 12/25/2023 12:02 PM EST RP Workstation: [id number]    Procedures Procedures (including critical care time)  Medications Ordered in UC Medications - No data to display  Initial Impression / Assessment and Plan / UC Course  I have reviewed the triage vital signs and the nursing notes.  Pertinent labs & imaging results that were available during my care of the patient were reviewed by me and considered in my medical decision making (see chart for details).     Vitals and exam very reassuring today, x-ray showing a left seventh rib fracture.  Treat supportively with  lidocaine  patches, Zanaflex , heat, stage, over-the-counter pain relievers as needed.  Return for worsening or unresolving symptoms. Final Clinical Impressions(s) / UC Diagnoses   Final diagnoses:  Rib pain on left side  Fall, initial encounter  Closed fracture of one rib of left side, initial encounter     Discharge Instructions      We will give you a call if anything comes back abnormal on your x-ray.  I have sent over some lidocaine  patches and muscle relaxer to help with your pain and you may do over-the-counter pain relievers, heat, rest.  Follow-up for worsening or unresolving symptoms.    ED Prescriptions     Medication Sig Dispense Auth. Provider   lidocaine  (LIDODERM ) 5 % Place 1 patch onto the skin daily. Remove & Discard patch within 12 hours or as directed by MD 30 patch Stuart Vernell Norris, PA-C   tiZANidine  (ZANAFLEX ) 4 MG tablet Take 1 tablet (4 mg total) by mouth every 8 (eight) hours as needed for muscle spasms. Do not drink alcohol or drive while taking this medication.  May cause drowsiness. 15 tablet Stuart Vernell Norris, [place]      PDMP not reviewed this encounter.  Stuart Vernell Norris, [place] 12/25/23 1547

## 2023-12-28 ENCOUNTER — Other Ambulatory Visit: Payer: Self-pay

## 2023-12-28 DIAGNOSIS — Z122 Encounter for screening for malignant neoplasm of respiratory organs: Secondary | ICD-10-CM

## 2023-12-28 DIAGNOSIS — F17210 Nicotine dependence, cigarettes, uncomplicated: Secondary | ICD-10-CM

## 2023-12-28 DIAGNOSIS — Z87891 Personal history of nicotine dependence: Secondary | ICD-10-CM

## 2024-01-05 ENCOUNTER — Ambulatory Visit (HOSPITAL_COMMUNITY): Admission: RE | Admit: 2024-01-05 | Discharge: 2024-01-05 | Attending: Acute Care | Admitting: Acute Care

## 2024-01-05 DIAGNOSIS — F17210 Nicotine dependence, cigarettes, uncomplicated: Secondary | ICD-10-CM

## 2024-01-05 DIAGNOSIS — Z87891 Personal history of nicotine dependence: Secondary | ICD-10-CM

## 2024-01-05 DIAGNOSIS — Z122 Encounter for screening for malignant neoplasm of respiratory organs: Secondary | ICD-10-CM

## 2024-01-12 ENCOUNTER — Telehealth: Payer: Self-pay

## 2024-01-12 NOTE — Telephone Encounter (Signed)
 Call Report from Tiffany  IMPRESSION: Lung-RADS 2, benign appearance or behavior. Continue annual screening with low-dose chest CT without contrast in 12 months.   Nondisplaced acute lateral left seventh rib fracture.   Aortic Atherosclerosis (ICD10-I70.0) and Emphysema (ICD10-J43.9).

## 2024-01-13 ENCOUNTER — Other Ambulatory Visit: Payer: Self-pay

## 2024-01-13 ENCOUNTER — Telehealth: Payer: Self-pay | Admitting: Acute Care

## 2024-01-13 DIAGNOSIS — Z87891 Personal history of nicotine dependence: Secondary | ICD-10-CM

## 2024-01-13 DIAGNOSIS — Z122 Encounter for screening for malignant neoplasm of respiratory organs: Secondary | ICD-10-CM

## 2024-01-13 LAB — COMPREHENSIVE METABOLIC PANEL WITH GFR
ALT: 24 IU/L (ref 0–44)
AST: 19 IU/L (ref 0–40)
Albumin: 4 g/dL (ref 3.9–4.9)
Alkaline Phosphatase: 102 IU/L (ref 47–123)
BUN/Creatinine Ratio: 11 (ref 10–24)
BUN: 10 mg/dL (ref 8–27)
Bilirubin Total: 0.3 mg/dL (ref 0.0–1.2)
CO2: 23 mmol/L (ref 20–29)
Calcium: 9.2 mg/dL (ref 8.6–10.2)
Chloride: 101 mmol/L (ref 96–106)
Creatinine, Ser: 0.94 mg/dL (ref 0.76–1.27)
Globulin, Total: 2.4 g/dL (ref 1.5–4.5)
Glucose: 187 mg/dL — ABNORMAL HIGH (ref 70–99)
Potassium: 4.2 mmol/L (ref 3.5–5.2)
Sodium: 140 mmol/L (ref 134–144)
Total Protein: 6.4 g/dL (ref 6.0–8.5)
eGFR: 91 mL/min/1.73 (ref >59)

## 2024-01-13 LAB — LIPID PANEL
Chol/HDL Ratio: 10.8 ratio — ABNORMAL HIGH (ref 0.0–5.0)
Cholesterol, Total: 303 mg/dL — ABNORMAL HIGH (ref 100–199)
HDL: 28 mg/dL — ABNORMAL LOW (ref >39)
LDL Chol Calc (NIH): 154 mg/dL — ABNORMAL HIGH (ref 0–99)
Triglycerides: 612 mg/dL (ref 0–149)
VLDL Cholesterol Cal: 121 mg/dL — ABNORMAL HIGH (ref 5–40)

## 2024-01-13 LAB — FIB-4 W/REFLEX TO ELF
FIB-4 Index: 1.12 (ref 0.00–2.67)
Platelets: 221 x10E3/uL (ref 150–450)

## 2024-01-13 NOTE — Telephone Encounter (Signed)
 See provider note 12/17, patient has been given results.

## 2024-01-13 NOTE — Telephone Encounter (Signed)
 Spoke with patient and reviewed recent Lung CT results. He will complete an annual Lung CT again next year. Order placed. He is aware of the left sided rib fracture. States he fell last month due to his dog getting tangled in his feet. Results and plan to PCP. Result letter sent to patient.

## 2024-01-13 NOTE — Telephone Encounter (Signed)
 Please call patient and let him know his low-dose screening CT was read as a lung RADS 2. 110-month follow-up.  Please let PCP know. Next annual scan is due 01/15/2025 There was notation of an acute rib fracture, the lateral left seventh rib On 12/25/2023 patient had a unilateral rib film to evaluate for rib fracture. Please call patient and just ensure he is aware of the acute rib fracture.  I think he most likely will be aware of it as he went to [facility] urgent care at [facility]. Thank you so much

## 2024-01-26 ENCOUNTER — Encounter: Payer: Self-pay | Admitting: "Endocrinology

## 2024-01-26 ENCOUNTER — Ambulatory Visit: Admitting: "Endocrinology

## 2024-01-26 VITALS — BP 130/81 | HR 103 | Resp 18 | Ht 69.0 in | Wt 179.6 lb

## 2024-01-26 DIAGNOSIS — E1159 Type 2 diabetes mellitus with other circulatory complications: Secondary | ICD-10-CM | POA: Diagnosis not present

## 2024-01-26 DIAGNOSIS — Z794 Long term (current) use of insulin: Secondary | ICD-10-CM | POA: Diagnosis not present

## 2024-01-26 DIAGNOSIS — E782 Mixed hyperlipidemia: Secondary | ICD-10-CM

## 2024-01-26 DIAGNOSIS — I10 Essential (primary) hypertension: Secondary | ICD-10-CM

## 2024-01-26 LAB — POCT GLYCOSYLATED HEMOGLOBIN (HGB A1C): Hemoglobin A1C: 12.2 % — AB (ref 4.0–5.6)

## 2024-01-26 MED ORDER — INSULIN LISPRO PROT & LISPRO (75-25 MIX) 100 UNIT/ML KWIKPEN: 50.0000 [IU] | PEN_INJECTOR | Freq: Two times a day (BID) | SUBCUTANEOUS | 1 refills | Status: AC

## 2024-01-26 MED ORDER — EMPAGLIFLOZIN 25 MG PO TABS: 25.0000 mg | ORAL_TABLET | Freq: Every day | ORAL | 1 refills | Status: AC

## 2024-01-26 NOTE — Patient Instructions (Signed)

## 2024-01-26 NOTE — Progress Notes (Signed)
 " 01/26/2024  Endocrinology follow-up note  Subjective:    Patient ID: Nicholas [name], male    DOB: 1960-09-15,    Past Medical History:  Diagnosis Date   Anal lesion    Anal papilla/tags - external lesion   Bilateral carpal tunnel syndrome 11/24/2018   Colon polyp    Hyperplastic rectosigmoid polyp 9/08   Coronary atherosclerosis of native coronary artery    DES to PLB, BMS to mid and distal RCA, DES to circumflex and distal LAD, 6/10 (5 stents)   Diabetic peripheral neuropathy (HCC) 11/24/2018   GERD (gastroesophageal reflux disease)    Headache    Migraines   Helicobacter pylori (H. pylori) 09/2009   Treated with helidac   Hiatal hernia    Pt denies   Hyperplastic colon polyp 2008   Mixed hyperlipidemia    Myocardial infarction (HCC) 06/2008   Seizures (HCC)    Related to stroke in 2018. None since then as of 01/2023   Stroke [facility])    January 2018   Type 2 diabetes mellitus [facility])    Past Surgical History:  Procedure Laterality Date   CHOLECYSTECTOMY     COLLAPSED LUNG     15 years ago   COLONOSCOPY  10/12/2006   Dr. Shaaron- rectosigmoid polyp s/phot snare polypectomy o/w normal rectum and terminal ileum. hyperplastic polyp on bx   COLONOSCOPY WITH PROPOFOL  N/A 09/01/2019   Procedure: COLONOSCOPY WITH PROPOFOL ;  Surgeon: Shaaron Lamar HERO, MD;  Location: [facility] ENDO SUITE;  Service: Endoscopy;  Laterality: N/A;  8:30am   CORONARY ARTERY BYPASS GRAFT N/A 02/09/2023   Procedure: CORONARY ARTERY BYPASS GRAFTING X 5, USING LEFT INTERNAL MAMMARY ARTERY AND ENDOSCOPICALLY HARVESTED LEFT SAPHENOUS VEIN GRAFT;  Surgeon: Lucas Dorise POUR, MD;  Location: [facility] OR;  Service: Open Heart Surgery;  Laterality: N/A;   EP IMPLANTABLE DEVICE N/A 02/12/2016   Procedure: Loop Recorder Insertion;  Surgeon: Lynwood Rakers, MD;  Location: [facility] INVASIVE CV LAB;  Service: Cardiovascular;  Laterality: N/A;   ESOPHAGOGASTRODUODENOSCOPY  10/12/2006   Dr. Shaaron- examination of the tubular esophagus revealed no  mucosal abnormalities. the EG junction was easily traversed. small hiatal hernia, the gastric mucosa o/w appeared normal. there was no infiltrating process or frank ulcer seen.   implantable loop recorder removal  09/29/2019   MDT Reveal LINQ removed in office by Dr Rakers   LEFT HEART CATH AND CORONARY ANGIOGRAPHY N/A 01/13/2023   Procedure: LEFT HEART CATH AND CORONARY ANGIOGRAPHY;  Surgeon: Wonda Sharper, MD;  Location: [facility] INVASIVE CV LAB;  Service: Cardiovascular;  Laterality: N/A;   TEE WITHOUT CARDIOVERSION N/A 02/12/2016   Procedure: TRANSESOPHAGEAL ECHOCARDIOGRAM (TEE);  Surgeon: Oneil JAYSON Parchment, MD;  Location: [facility] ENDOSCOPY;  Service: Cardiovascular;  Laterality: N/A;   TEE WITHOUT CARDIOVERSION N/A 02/09/2023   Procedure: TRANSESOPHAGEAL ECHOCARDIOGRAM (TEE);  Surgeon: Lucas Dorise POUR, MD;  Location: [facility] OR;  Service: Open Heart Surgery;  Laterality: N/A;   WIRE IN APEX OF RIGHT LUNG     Since childhood   Social History   Socioeconomic History   Marital status: Married    Spouse name: Not on file   Number of children: Not on file   Years of education: Not on file   Highest education level: Not on file  Occupational History   Occupation: maintenance, parking lots    Employer: SELF-EMPLOYED  Tobacco Use   Smoking status: Former    Current packs/day: 0.50    Average packs/day: 0.5 packs/day for 50.8 years (25.4 ttl pk-yrs)  Types: Cigarettes    Start date: 04/08/1973   Smokeless tobacco: Never  Vaping Use   Vaping status: Never Used  Substance and Sexual Activity   Alcohol use: No    Alcohol/week: 0.0 standard drinks of alcohol   Drug use: No   Sexual activity: Yes    Partners: Female    Birth control/protection: Pill  Other Topics Concern   Not on file  Social History Narrative   Drinks Coffee 2 cups daily.  Lives sat home with wife.   Is self employed.  Graduated from High school.     Social Drivers of Health   Tobacco Use: Medium Risk (01/26/2024)   Patient  History    Smoking Tobacco Use: Former    Smokeless Tobacco Use: Never    Passive Exposure: Not on file  Financial Resource Strain: Not on file  Food Insecurity: No Food Insecurity (02/09/2023)   Hunger Vital Sign    Worried About Running Out of Food in the Last Year: Never true    Ran Out of Food in the Last Year: Never true  Transportation Needs: No Transportation Needs (02/09/2023)   PRAPARE - Administrator, Civil Service (Medical): No    Lack of Transportation (Non-Medical): No  Physical Activity: Not on file  Stress: Not on file  Social Connections: Not on file  Depression (PHQ2-9): Low Risk (03/25/2023)   Depression (PHQ2-9)    PHQ-2 Score: 0  Alcohol Screen: Not on file  Housing: Low Risk (02/09/2023)   Housing Stability Vital Sign    Unable to Pay for Housing in the Last Year: No    Number of Times Moved in the Last Year: 0    Homeless in the Last Year: No  Utilities: Not At Risk (02/09/2023)   AHC Utilities    Threatened with loss of utilities: No  Health Literacy: Not on file   Outpatient Encounter Medications as of 01/26/2024  Medication Sig   acetaminophen  (TYLENOL ) 500 MG tablet Take 500 mg by mouth every 6 (six) hours as needed (for headaches).   albuterol  (VENTOLIN  HFA) 108 (90 Base) MCG/ACT inhaler Inhale 1-2 puffs into the lungs every 6 (six) hours as needed for wheezing or shortness of breath.   aspirin  EC 325 MG tablet Take 1 tablet (325 mg total) by mouth daily.   BD PEN NEEDLE NANO U/F 32G X 4 MM MISC USE AS DIRECTED FOUR TIMES DAILY.   Coenzyme Q10 (COQ10) 100 MG CAPS Take 100 mg by mouth daily.   Continuous Glucose Receiver (FREESTYLE LIBRE 3 READER) DEVI 1 Piece by Does not apply route once as needed for up to 1 dose.   Continuous Glucose Sensor (FREESTYLE LIBRE 3 PLUS SENSOR) MISC USE AS DIRECTED TO MONITOR GLUCOSE CONTINUOUSLY AS DIRECTED, CHANGE SENSOR EVERY 15 DAYS.   gabapentin  (NEURONTIN ) 100 MG capsule Take 100 mg by mouth daily.     lidocaine  (LIDODERM ) 5 % Place 1 patch onto the skin daily. Remove & Discard patch within 12 hours or as directed by MD   metoprolol  tartrate (LOPRESSOR ) 50 MG tablet Take 1 tablet (50 mg total) by mouth 2 (two) times daily.   mupirocin  ointment (BACTROBAN ) 2 % Apply 1 Application topically daily.   nicotine  (NICODERM CQ  - DOSED IN MG/24 HR) 7 mg/24hr patch Place 1 patch (7 mg total) onto the skin daily.   nitroGLYCERIN  (NITROSTAT ) 0.4 MG SL tablet Place 1 tablet (0.4 mg total) under the tongue every 5 (five) minutes as needed for  chest pain.   nortriptyline  (PAMELOR ) 10 MG capsule Take 4 capsules (40 mg total) by mouth at bedtime.   pantoprazole  (PROTONIX ) 40 MG tablet Take 40 mg by mouth daily before breakfast.   rosuvastatin  (CRESTOR ) 40 MG tablet Take 40 mg by mouth at bedtime.   tiZANidine  (ZANAFLEX ) 4 MG tablet Take 1 tablet (4 mg total) by mouth every 8 (eight) hours as needed for muscle spasms. Do not drink alcohol or drive while taking this medication.  May cause drowsiness.   Ubrogepant  (UBRELVY ) 100 MG TABS Take 1 tablet (100 mg total) by mouth daily as needed. Take one tablet at onset of headache, may repeat 1 tablet in 2 hours, no more than 2 tablets in 24 hours   [DISCONTINUED] empagliflozin  (JARDIANCE ) 10 MG TABS tablet TAKE 1 TABLET BY MOUTH ONCE DAILY BEFORE BREAKFAST   [DISCONTINUED] Insulin  Lispro Prot & Lispro (HUMALOG  MIX 75/25 KWIKPEN) (75-25) 100 UNIT/ML Kwikpen Inject 50 Units into the skin 2 (two) times daily with a meal.   empagliflozin  (JARDIANCE ) 25 MG TABS tablet Take 1 tablet (25 mg total) by mouth daily before breakfast.   Insulin  Lispro Prot & Lispro (HUMALOG  MIX 75/25 KWIKPEN) (75-25) 100 UNIT/ML Kwikpen Inject 50-60 Units into the skin 2 (two) times daily with a meal.   No facility-administered encounter medications on file as of 01/26/2024.   ALLERGIES: Allergies  Allergen Reactions   Keppra  [Levetiracetam ]     Hallucination   Topamax  [Topiramate ]      irritable   VACCINATION STATUS:  There is no immunization history on file for this patient.  Diabetes He presents for his follow-up diabetic visit. He has type 2 diabetes mellitus. Onset time: He was diagnosed at approximate age of 49 years. In this particular patient with a history of heavy alcohol use possiblity of pancreatic diabetes is high. His disease course has been fluctuating. Pertinent negatives for hypoglycemia include no confusion, headaches, pallor, seizures or speech difficulty. Pertinent negatives for diabetes include no chest pain, no fatigue, no polydipsia, no polyphagia, no polyuria and no weakness. There are no hypoglycemic complications. Symptoms are worsening. Diabetic complications include a CVA and heart disease. (Patient is alarmingly noncompliant did not follow insulin  med regimen recommended, did not utilize his CGM glucose readings.) Risk factors for coronary artery disease include diabetes mellitus, dyslipidemia, hypertension, male sex, sedentary lifestyle and tobacco exposure. Current diabetic treatment includes oral agent (dual therapy) and insulin  injections. He is compliant with treatment some of the time. His weight is fluctuating minimally. He is following a generally unhealthy diet. When asked about meal planning, he reported none. He has not had a previous visit with a dietitian (he did not keep appointment.). He rarely participates in exercise. His home blood glucose trend is fluctuating minimally. His breakfast blood glucose range is generally >200 mg/dl. His lunch blood glucose range is generally >200 mg/dl. His dinner blood glucose range is generally >200 mg/dl. His bedtime blood glucose range is generally >200 mg/dl. His overall blood glucose range is >200 mg/dl. (He presents with his CGM device showing slight improvement in his glycemic profile, still significantly above target.  His AGP report shows 11% time in range, 15% of 1 hyperglycemia, 74% level 2  hyperglycemia.  Admittedly, he has been skipping his breakfast dose of insulin .  His point-of-care A1c is 12.2% improving from 14.7%.   He is tolerating Jardiance  10 mg p.o. daily which was started during his last visit.  He did not document any hypoglycemia.  His average blood  glucose is 308 mg per DL for the most recent 14 days.) Eye exam is current.  Hyperlipidemia This is a chronic problem. The current episode started more than 1 year ago. The problem is uncontrolled (He was convinced to take Crestor  during his last visit, returns with significant improvement in his LDL from 135 down to 87, triglycerides from 396-215.). Exacerbating diseases include diabetes. Pertinent negatives include no chest pain, myalgias or shortness of breath. Current antihyperlipidemic treatment includes statins. Risk factors for coronary artery disease include dyslipidemia, diabetes mellitus, hypertension and a sedentary lifestyle.  Hypertension This is a chronic problem. The current episode started more than 1 year ago. The problem is controlled. Pertinent negatives include no chest pain, headaches, neck pain, palpitations or shortness of breath. Risk factors for coronary artery disease include diabetes mellitus, dyslipidemia and smoking/tobacco exposure. Past treatments include beta blockers. Hypertensive end-organ damage includes CAD/MI and CVA.     Objective:    BP 130/81   Pulse (!) 103   Resp 18   Ht 5' 9 (1.753 m)   Wt 179 lb 9.6 oz (81.5 kg)   SpO2 96%   BMI 26.52 kg/m   Wt Readings from Last 3 Encounters:  01/26/24 179 lb 9.6 oz (81.5 kg)  10/14/23 181 lb (82.1 kg)  10/01/23 178 lb 3.2 oz (80.8 kg)       Diabetic Labs (most recent): Lab Results  Component Value Date   HGBA1C 12.2 (A) 01/26/2024   HGBA1C 12.7 (A) 10/14/2023   HGBA1C 13.9 (A) 07/07/2023   Lipid Panel     Component Value Date/Time   CHOL 303 (H) 01/12/2024 0816   TRIG 612 (HH) 01/12/2024 0816   HDL 28 (L) 01/12/2024 0816    CHOLHDL 10.8 (H) 01/12/2024 0816   CHOLHDL 3.4 06/08/2023 0904   VLDL 50 (H) 06/08/2023 0904   LDLCALC 154 (H) 01/12/2024 0816   LDLCALC 139 (H) 08/30/2020 0741      Latest Ref Rng & Units 01/12/2024    8:16 AM 02/11/2023    3:51 AM 02/10/2023    4:01 PM  CMP  Glucose 70 - 99 mg/dL 812  837  784   BUN 8 - 27 mg/dL 10  14  14    Creatinine 0.76 - 1.27 mg/dL 9.05  8.99  8.87   Sodium 134 - 144 mmol/L 140  134  134   Potassium 3.5 - 5.2 mmol/L 4.2  4.1  4.5   Chloride 96 - 106 mmol/L 101  100  100   CO2 20 - 29 mmol/L 23  25  24    Calcium  8.6 - 10.2 mg/dL 9.2  8.2  8.2   Total Protein 6.0 - 8.5 g/dL 6.4     Total Bilirubin 0.0 - 1.2 mg/dL 0.3     Alkaline Phos 47 - 123 IU/L 102     AST 0 - 40 IU/L 19     ALT 0 - 44 IU/L 24        Assessment & Plan:   1. Type 2 diabetes mellitus with vascular disease (HCC) -His diabetes care is  complicated by history of alarming noncompliance/nonadherence, coronary artery disease, underwent CABG x 5 in January 2025 and patient remains at a high risk for more acute and chronic complications of diabetes which include CAD, CVA, CKD, retinopathy, and neuropathy. These are all discussed in detail with the patient.  He presents with his CGM device showing slight improvement in his glycemic profile, still significantly above target.  His  AGP report shows 11% time in range, 15% of 1 hyperglycemia, 74% level 2 hyperglycemia.  Admittedly, he has been skipping his breakfast dose of insulin .  His point-of-care A1c is 12.2% improving from 14.7%.   He is tolerating Jardiance  10 mg p.o. daily which was started during his last visit.  He did not document any hypoglycemia.  His average blood glucose is 308 mg per DL for the most recent 14 days.     - Suggestion is made for him to avoid simple carbohydrates  from his diet including Cakes, Sweet Desserts, Ice Cream, Soda (diet and regular), Sweet Tea, Candies, Chips, Cookies, Store Bought Juices, Alcohol in Excess of  1-2  drinks a day, Artificial Sweeteners,  Coffee Creamer, and Sugar-free Products, Lemonade. This will help patient to have more stable blood glucose profile and potentially avoid unintended weight gain.  - Patient is advised to stick to a routine mealtimes to eat 3 meals  a day and avoid unnecessary snacks ( to snack only to correct hypoglycemia).  - I have approached patient with the following individualized plan to manage diabetes and patient agrees.  -Unfortunately, Mr. Jesson remains and engaged in optimal self-care. He did not comply with basal/bolus insulin  previously tried.  He has not done even that premixed insulin  which was offered to him for simplicity reasons.   - He has filled the prescription for Jardiance  and tolerating.  I discussed and increase Jardiance  to 25 mg p.o. daily at breakfast. - He will need insulin  treatment for him to achieve control of diabetes to target.  Accordingly, he is advised to increase Humalog  75/25 to 60 units with breakfast and 50 units with supper when Premeal blood glucose readings are above 90 mg per DL.    -He is advised to continue monitoring blood glucose continuously using his CGM.  -He is warned not to inject insulin  without proper monitoring of blood glucose.  -Target numbers for A1c, LDL, HDL, Triglycerides,  were discussed in detail.   2) BP/HTN:  His blood pressure is controlled to target.  He is advised to continue his current medications including beta blockers.  He has marginal blood pressure, he will not tolerate additional ACE inhibitors.  3) Lipids/HPL: His previsit labs show LDL at 154, consistent with uncontrolled status.  He is advised to resume and continue Crestor  40 mg p.o. daily.  Side effects and precautions discussed with him.  4)  Weight/Diet: His BMI is 26.52 kg/m-  He is not a candidate for major weight loss.  He is  following with  CDE consult, exercise, and carbohydrates information provided.    5) Chronic Care/Health  Maintenance:  -Patient is  Statin medications and encouraged to continue to follow up with Ophthalmology, Podiatrist at least yearly or according to recommendations, and advised to stay away from Smoking .  He previously quit smoking after a CVA, however resumed subsequently.  I have recommended yearly flu vaccine and pneumonia vaccination at least every 5 years; moderate intensity exercise for up to 150 minutes weekly; and  sleep for at least 7 hours a day.    He recently had normal ABI, will be repeated in November 2026,  or sooner if needed. I advised patient to maintain close follow up with his PCP for primary care needs.   I spent  41  minutes in the care of the patient today including review of labs from CMP, Lipids, Thyroid  Function, Hematology (current and previous including abstractions from other facilities); face-to-face time discussing  his blood glucose readings/logs, discussing hypoglycemia and hyperglycemia episodes and symptoms, medications doses, his options of short and long term treatment based on the latest standards of care / guidelines;  discussion about incorporating lifestyle medicine;  and documenting the encounter. Risk reduction counseling performed per USPSTF guidelines to reduce cardiovascular risk factors.     Please refer to Patient Instructions for Blood Glucose Monitoring and Insulin /Medications Dosing Guide  in media tab for additional information. Please  also refer to  Patient Self Inventory in the Media  tab for reviewed elements of pertinent patient history.  Nicholas [name] participated in the discussions, expressed understanding, and voiced agreement with the above plans.  All questions were answered to his satisfaction. he is encouraged to contact clinic should he have any questions or concerns prior to his return visit.   Follow up plan: Return in about 3 months (around 04/25/2024) for Bring Meter/CGM Device/Logs- A1c in Office.  Ranny Earl, MD Phone:  [phone]  Fax: [phone]  This note was partially dictated with voice recognition software. Similar sounding words can be transcribed inadequately or may not  be corrected upon review.  01/26/2024, 11:28 AM "

## 2024-02-23 ENCOUNTER — Encounter: Payer: Self-pay | Admitting: Student

## 2024-02-23 ENCOUNTER — Ambulatory Visit: Attending: Student | Admitting: Student

## 2024-02-23 VITALS — BP 110/62 | HR 73 | Ht 69.0 in | Wt 177.4 lb

## 2024-02-23 DIAGNOSIS — I10 Essential (primary) hypertension: Secondary | ICD-10-CM

## 2024-02-23 DIAGNOSIS — E785 Hyperlipidemia, unspecified: Secondary | ICD-10-CM | POA: Diagnosis not present

## 2024-02-23 DIAGNOSIS — N529 Male erectile dysfunction, unspecified: Secondary | ICD-10-CM | POA: Diagnosis not present

## 2024-02-23 DIAGNOSIS — I2510 Atherosclerotic heart disease of native coronary artery without angina pectoris: Secondary | ICD-10-CM | POA: Diagnosis not present

## 2024-02-23 DIAGNOSIS — I5020 Unspecified systolic (congestive) heart failure: Secondary | ICD-10-CM

## 2024-02-23 MED ORDER — ROSUVASTATIN CALCIUM 40 MG PO TABS: 40.0000 mg | ORAL_TABLET | Freq: Every day | ORAL | 3 refills | Status: AC

## 2024-02-23 MED ORDER — METOPROLOL TARTRATE 50 MG PO TABS: 50.0000 mg | ORAL_TABLET | Freq: Two times a day (BID) | ORAL | 3 refills | Status: AC

## 2024-02-23 NOTE — Progress Notes (Signed)
 "  Cardiology Office Note    Date:  02/23/2024  ID:  Sal, Spratley 30-Mar-1960, MRN [id number] Cardiologist: Jayson Sierras, MD { : History of Present Illness:    Nicholas [name] is a 64 y.o. 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  He was last examined by Dr. Sierras in 05/2023 and denied any recent anginal symptoms at that time. It was recommend to obtain a follow-up echocardiogram as his EF had been reduced at 40 to 45% by TEE in 01/2023. He was continued on ASA 325 mg daily, Zetia  10 mg daily, Lopressor  50 mg twice daily and Crestor  40 mg daily. Follow-up echocardiogram showed his EF had improved to 50 to 55% with low-normal RV function and no changes were made to his medications at that time.  In talking with the patient today, he reports feeling great since undergoing CABG. He denies any recent chest pain or dyspnea on exertion. He has not smoked cigarettes since undergoing CABG in 01/2023. He denies any recent orthopnea, PND or pitting edema. He does report having erectile dysfunction and is unsure if this is due to his age or medications. He has not been taking his statin as he thought he was supposed to stop this at the time of his last office visit.  No longer on Zetia  either for unclear reasons.  Studies Reviewed:   EKG: EKG is not ordered today.   Cardiac Catheterization: 12/2022   Ost RCA to Prox RCA lesion is 100% stenosed.   Prox RCA to Mid RCA lesion is 100% stenosed.   Dist RCA lesion is 100% stenosed.   RPAV lesion is 100% stenosed.   Prox LAD lesion is 80% stenosed.   Mid LAD lesion is 80% stenosed.   Prox Cx to Mid Cx lesion is 100% stenosed.   1st Mrg lesion is 90% stenosed.   Severe  multivessel coronary artery disease Severe calcific proximal and mid LAD stenoses of 80% Severe in-stent restenosis in the first obtuse marginal branch and total occlusion of the mid circumflex beyond the OM Total occlusion of the RCA at the ostium Collateral filling of the right posterolateral branch and second OM   Recommendation: Patient with severe diffuse calcified proximal and mid LAD stenoses as well as multivessel disease with chronic occlusion of the RCA and mid circumflex.  Patient with LV dysfunction by echo assessment.  Recommend outpatient surgical consult for consideration of CABG.  Plan discussed with patient and family who are agreeable.  Echocardiogram: 05/2023 IMPRESSIONS     1. False tendon in LV Inferior hyopokinesis/akinesis (base/mid). Left  ventricular ejection fraction, by estimation, is 50 to 55%. The left  ventricle has low normal function. There is mild left ventricular  hypertrophy. Left ventricular diastolic  parameters were normal.   2. Right ventricular systolic function is low normal. The right  ventricular size is normal.   3. Trivial mitral valve regurgitation.   4. The aortic valve is tricuspid. Aortic valve regurgitation is not  visualized.   5. The inferior vena cava is normal in size with greater than 50%  respiratory variability, suggesting right atrial pressure of 3 mmHg.   Comparison(s): The left ventricular function is worsened.    Physical  Exam:   VS:  BP 110/62 (BP Location: Left Arm, Cuff Size: Normal)   Pulse 73   Ht 5' 9 (1.753 m)   Wt 177 lb 6.4 oz (80.5 kg)   SpO2 91%   BMI 26.20 kg/m    Wt Readings from Last 3 Encounters:  02/23/24 177 lb 6.4 oz (80.5 kg)  01/26/24 179 lb 9.6 oz (81.5 kg)  10/14/23 181 lb (82.1 kg)     GEN: Pleasant male appearing in no acute distress NECK: No JVD; No carotid bruits CARDIAC: RRR, no murmurs, rubs, gallops RESPIRATORY:  Clear to auscultation without rales, wheezing or rhonchi  ABDOMEN:  Appears non-distended. No obvious abdominal masses. EXTREMITIES: No clubbing or cyanosis. No pitting edema.  Distal pedal pulses are 2+ bilaterally.   Assessment and Plan:   1. Coronary artery disease involving native coronary artery of native heart without angina pectoris - He recently underwent CABG in 01/2023 with LIMA-LAD, SVG-D1, SVG-D2, SVG-OM and SVG-PL. He remains active at baseline and denies any recent anginal symptoms.  - Continue current medical therapy with ASA, Jardiance  25mg  daily (on this per Endocrinology as well) and Lopressor  (with dose adjustment as discussed below). Will restart Crestor  40mg  daily as discussed below.   2. Chronic HFimpEF - His EF was previously at 40-45% in 10/2022 and improved to 50-55% by repeat echo in 05/2023. He appears euvolemic at this time and denies any respiratory issues.  - Continue Jardiance  25mg  daily (on this for Type 2 DM as well) along with Lopressor .   3. Hyperlipidemia LDL goal <55 - He stopped Crestor  and Zetia  at the time of his last office visit for unclear reasons. FLP in 12/2023 showed his cholesterol was significantly elevated with total cholesterol at 303, triglycerides 612, HDL 28 and LDL 154. Reviewed options with the patient and will restart Crestor  40 mg daily. Recheck FLP and LFT's in 3 months. If LDL remains above goal, would restart Zetia .  4. Essential hypertension - His blood pressure is well-controlled at 110/62 during today's visit. Will temporarily reduce Lopressor  to 25 mg twice daily as discussed below to see if this helps with ED.  5. Erectile dysfunction, unspecified erectile dysfunction type - He reports worsening symptoms over the past few years and is unsure if this is due to age or medication changes. He wishes to adjust medical therapy initially before adding a PDE5 inhibitor. Reviewed with the patient and will reduce Lopressor  from 50 mg twice daily to 25 mg twice daily. If no improvement in symptoms, I  encouraged him to make us  aware as we can provide a prescription for Sildenafil. We reviewed that if he were to use SL NTG that he cannot use the medication within 48 hours of this.  Signed, Laymon CHRISTELLA Qua, PA-C   "

## 2024-02-23 NOTE — Patient Instructions (Signed)
 Medication Instructions:   Try reducing Lopressor  to 25mg  twice daily to see if this helps with symptoms.   Restart Crestor  40mg  daily.    *If you need a refill on your cardiac medications before your next appointment, please call your pharmacy*  Lab Work:  FLP and LFT's in 3 months.   If you have labs (blood work) drawn today and your tests are completely normal, you will receive your results only by: MyChart Message (if you have MyChart) OR A paper copy in the mail If you have any lab test that is abnormal or we need to change your treatment, we will call you to review the results.  Follow-Up: At [facility], you and your health needs are our priority.  As part of our continuing mission to provide you with exceptional heart care, our providers are all part of one team.  This team includes your primary Cardiologist (physician) and Advanced Practice Providers or APPs (Physician Assistants and Nurse Practitioners) who all work together to provide you with the care you need, when you need it.  Your next appointment:   6 month(s)  Provider:   You may see Jayson Sierras, MD or one of the following Advanced Practice Providers on your designated Care Team:   Laymon Qua, PA-C  [place], [place] Olivia Pavy, [place]     We recommend signing up for the patient portal called MyChart.  Sign up information is provided on this After Visit Summary.  MyChart is used to connect with patients for Virtual Visits (Telemedicine).  Patients are able to view lab/test results, encounter notes, upcoming appointments, etc.  Non-urgent messages can be sent to your provider as well.   To learn more about what you can do with MyChart, go to [email or web address].   Other Instructions

## 2024-03-07 ENCOUNTER — Ambulatory Visit: Admitting: Physician Assistant

## 2024-04-18 ENCOUNTER — Ambulatory Visit: Admitting: Family Medicine

## 2024-04-27 ENCOUNTER — Ambulatory Visit: Admitting: "Endocrinology
